# Patient Record
Sex: Female | Born: 1945 | Race: Black or African American | Hispanic: No | Marital: Single | Smoking: Former smoker
Health system: Southern US, Community
[De-identification: ages and names within clinical notes are randomized; demographics above are authoritative.]

## PROBLEM LIST (undated history)

## (undated) DIAGNOSIS — K279 Peptic ulcer, site unspecified, unspecified as acute or chronic, without hemorrhage or perforation: Secondary | ICD-10-CM

## (undated) DIAGNOSIS — I6529 Occlusion and stenosis of unspecified carotid artery: Secondary | ICD-10-CM

## (undated) DIAGNOSIS — E785 Hyperlipidemia, unspecified: Secondary | ICD-10-CM

## (undated) DIAGNOSIS — K219 Gastro-esophageal reflux disease without esophagitis: Secondary | ICD-10-CM

## (undated) DIAGNOSIS — Z9289 Personal history of other medical treatment: Secondary | ICD-10-CM

## (undated) DIAGNOSIS — R0602 Shortness of breath: Secondary | ICD-10-CM

## (undated) DIAGNOSIS — I340 Nonrheumatic mitral (valve) insufficiency: Secondary | ICD-10-CM

## (undated) DIAGNOSIS — K8020 Calculus of gallbladder without cholecystitis without obstruction: Secondary | ICD-10-CM

## (undated) DIAGNOSIS — M8580 Other specified disorders of bone density and structure, unspecified site: Secondary | ICD-10-CM

## (undated) DIAGNOSIS — D509 Iron deficiency anemia, unspecified: Secondary | ICD-10-CM

## (undated) DIAGNOSIS — E162 Hypoglycemia, unspecified: Secondary | ICD-10-CM

## (undated) DIAGNOSIS — N189 Chronic kidney disease, unspecified: Secondary | ICD-10-CM

## (undated) DIAGNOSIS — I10 Essential (primary) hypertension: Secondary | ICD-10-CM

## (undated) DIAGNOSIS — E119 Type 2 diabetes mellitus without complications: Secondary | ICD-10-CM

## (undated) DIAGNOSIS — I739 Peripheral vascular disease, unspecified: Secondary | ICD-10-CM

## (undated) DIAGNOSIS — I2510 Atherosclerotic heart disease of native coronary artery without angina pectoris: Secondary | ICD-10-CM

## (undated) DIAGNOSIS — H269 Unspecified cataract: Secondary | ICD-10-CM

## (undated) DIAGNOSIS — I700 Atherosclerosis of aorta: Secondary | ICD-10-CM

## (undated) DIAGNOSIS — M1990 Unspecified osteoarthritis, unspecified site: Secondary | ICD-10-CM

## (undated) HISTORY — PX: ABDOMINAL HYSTERECTOMY: SHX81

## (undated) HISTORY — DX: Type 2 diabetes mellitus without complications: E11.9

## (undated) HISTORY — DX: Iron deficiency anemia, unspecified: D50.9

## (undated) HISTORY — DX: Occlusion and stenosis of unspecified carotid artery: I65.29

## (undated) HISTORY — DX: Essential (primary) hypertension: I10

## (undated) HISTORY — PX: COLONOSCOPY: SHX174

## (undated) HISTORY — DX: Other specified disorders of bone density and structure, unspecified site: M85.80

## (undated) HISTORY — PX: APPENDECTOMY: SHX54

## (undated) HISTORY — PX: CORONARY ANGIOPLASTY WITH STENT PLACEMENT: SHX49

## (undated) HISTORY — DX: Peptic ulcer, site unspecified, unspecified as acute or chronic, without hemorrhage or perforation: K27.9

## (undated) HISTORY — DX: Peripheral vascular disease, unspecified: I73.9

## (undated) HISTORY — DX: Nonrheumatic mitral (valve) insufficiency: I34.0

## (undated) HISTORY — DX: Gastro-esophageal reflux disease without esophagitis: K21.9

## (undated) HISTORY — DX: Hyperlipidemia, unspecified: E78.5

## (undated) HISTORY — DX: Unspecified cataract: H26.9

## (undated) HISTORY — DX: Personal history of other medical treatment: Z92.89

## (undated) HISTORY — DX: Atherosclerotic heart disease of native coronary artery without angina pectoris: I25.10

---

## 1998-02-11 ENCOUNTER — Emergency Department (HOSPITAL_COMMUNITY): Admission: EM | Admit: 1998-02-11 | Discharge: 1998-02-11 | Payer: Self-pay

## 1998-04-30 ENCOUNTER — Encounter: Admission: RE | Admit: 1998-04-30 | Discharge: 1998-04-30 | Payer: Self-pay | Admitting: Hematology and Oncology

## 1998-06-15 ENCOUNTER — Encounter: Admission: RE | Admit: 1998-06-15 | Discharge: 1998-06-15 | Payer: Self-pay | Admitting: Internal Medicine

## 1998-06-18 ENCOUNTER — Ambulatory Visit (HOSPITAL_COMMUNITY): Admission: RE | Admit: 1998-06-18 | Discharge: 1998-06-18 | Payer: Self-pay | Admitting: Internal Medicine

## 1998-07-22 ENCOUNTER — Encounter: Admission: RE | Admit: 1998-07-22 | Discharge: 1998-07-22 | Payer: Self-pay | Admitting: Internal Medicine

## 1998-09-10 ENCOUNTER — Ambulatory Visit: Admission: RE | Admit: 1998-09-10 | Discharge: 1998-09-10 | Payer: Self-pay | Admitting: Internal Medicine

## 1998-09-30 ENCOUNTER — Encounter: Admission: RE | Admit: 1998-09-30 | Discharge: 1998-09-30 | Payer: Self-pay | Admitting: Internal Medicine

## 1998-11-09 ENCOUNTER — Encounter: Admission: RE | Admit: 1998-11-09 | Discharge: 1998-11-09 | Payer: Self-pay | Admitting: Internal Medicine

## 1999-02-23 ENCOUNTER — Encounter: Admission: RE | Admit: 1999-02-23 | Discharge: 1999-02-23 | Payer: Self-pay | Admitting: Internal Medicine

## 1999-02-23 ENCOUNTER — Ambulatory Visit (HOSPITAL_COMMUNITY): Admission: RE | Admit: 1999-02-23 | Discharge: 1999-02-23 | Payer: Self-pay | Admitting: Internal Medicine

## 1999-03-16 ENCOUNTER — Ambulatory Visit (HOSPITAL_COMMUNITY): Admission: RE | Admit: 1999-03-16 | Discharge: 1999-03-16 | Payer: Self-pay | Admitting: Internal Medicine

## 1999-04-26 ENCOUNTER — Encounter: Admission: RE | Admit: 1999-04-26 | Discharge: 1999-04-26 | Payer: Self-pay | Admitting: Internal Medicine

## 1999-09-06 ENCOUNTER — Encounter: Admission: RE | Admit: 1999-09-06 | Discharge: 1999-09-06 | Payer: Self-pay | Admitting: Internal Medicine

## 1999-09-17 ENCOUNTER — Ambulatory Visit (HOSPITAL_COMMUNITY): Admission: RE | Admit: 1999-09-17 | Discharge: 1999-09-17 | Payer: Self-pay | Admitting: Internal Medicine

## 2000-01-17 ENCOUNTER — Encounter: Admission: RE | Admit: 2000-01-17 | Discharge: 2000-01-17 | Payer: Self-pay | Admitting: Internal Medicine

## 2000-05-31 ENCOUNTER — Encounter: Admission: RE | Admit: 2000-05-31 | Discharge: 2000-05-31 | Payer: Self-pay | Admitting: Internal Medicine

## 2000-07-14 ENCOUNTER — Ambulatory Visit (HOSPITAL_BASED_OUTPATIENT_CLINIC_OR_DEPARTMENT_OTHER): Admission: RE | Admit: 2000-07-14 | Discharge: 2000-07-14 | Payer: Self-pay | Admitting: Internal Medicine

## 2000-10-02 ENCOUNTER — Encounter: Admission: RE | Admit: 2000-10-02 | Discharge: 2000-10-02 | Payer: Self-pay | Admitting: Internal Medicine

## 2000-10-09 ENCOUNTER — Encounter: Admission: RE | Admit: 2000-10-09 | Discharge: 2000-10-09 | Payer: Self-pay | Admitting: Internal Medicine

## 2000-10-12 ENCOUNTER — Ambulatory Visit (HOSPITAL_COMMUNITY): Admission: RE | Admit: 2000-10-12 | Discharge: 2000-10-12 | Payer: Self-pay | Admitting: Internal Medicine

## 2000-11-23 ENCOUNTER — Encounter: Admission: RE | Admit: 2000-11-23 | Discharge: 2000-11-23 | Payer: Self-pay | Admitting: Hematology and Oncology

## 2001-01-08 ENCOUNTER — Encounter: Admission: RE | Admit: 2001-01-08 | Discharge: 2001-01-08 | Payer: Self-pay | Admitting: Internal Medicine

## 2001-01-17 ENCOUNTER — Ambulatory Visit (HOSPITAL_COMMUNITY): Admission: RE | Admit: 2001-01-17 | Discharge: 2001-01-17 | Payer: Self-pay | Admitting: *Deleted

## 2001-01-23 ENCOUNTER — Ambulatory Visit (HOSPITAL_COMMUNITY): Admission: RE | Admit: 2001-01-23 | Discharge: 2001-01-23 | Payer: Self-pay | Admitting: *Deleted

## 2001-05-21 ENCOUNTER — Encounter: Admission: RE | Admit: 2001-05-21 | Discharge: 2001-05-21 | Payer: Self-pay | Admitting: Internal Medicine

## 2001-07-24 ENCOUNTER — Encounter: Admission: RE | Admit: 2001-07-24 | Discharge: 2001-07-24 | Payer: Self-pay | Admitting: Internal Medicine

## 2001-07-24 ENCOUNTER — Ambulatory Visit (HOSPITAL_COMMUNITY): Admission: RE | Admit: 2001-07-24 | Discharge: 2001-07-24 | Payer: Self-pay | Admitting: Internal Medicine

## 2001-08-03 ENCOUNTER — Encounter: Admission: RE | Admit: 2001-08-03 | Discharge: 2001-08-03 | Payer: Self-pay | Admitting: Internal Medicine

## 2001-09-26 ENCOUNTER — Encounter: Admission: RE | Admit: 2001-09-26 | Discharge: 2001-09-26 | Payer: Self-pay | Admitting: Internal Medicine

## 2002-02-11 ENCOUNTER — Encounter: Admission: RE | Admit: 2002-02-11 | Discharge: 2002-02-11 | Payer: Self-pay | Admitting: Internal Medicine

## 2002-02-11 ENCOUNTER — Ambulatory Visit (HOSPITAL_COMMUNITY): Admission: RE | Admit: 2002-02-11 | Discharge: 2002-02-11 | Payer: Self-pay | Admitting: Internal Medicine

## 2002-02-15 ENCOUNTER — Ambulatory Visit (HOSPITAL_COMMUNITY): Admission: RE | Admit: 2002-02-15 | Discharge: 2002-02-15 | Payer: Self-pay | Admitting: *Deleted

## 2002-05-29 ENCOUNTER — Encounter: Admission: RE | Admit: 2002-05-29 | Discharge: 2002-05-29 | Payer: Self-pay | Admitting: Internal Medicine

## 2003-01-06 ENCOUNTER — Emergency Department (HOSPITAL_COMMUNITY): Admission: EM | Admit: 2003-01-06 | Discharge: 2003-01-06 | Payer: Self-pay | Admitting: Emergency Medicine

## 2003-02-10 ENCOUNTER — Encounter: Admission: RE | Admit: 2003-02-10 | Discharge: 2003-02-10 | Payer: Self-pay | Admitting: Internal Medicine

## 2003-03-02 ENCOUNTER — Emergency Department (HOSPITAL_COMMUNITY): Admission: EM | Admit: 2003-03-02 | Discharge: 2003-03-02 | Payer: Self-pay | Admitting: Emergency Medicine

## 2003-03-17 ENCOUNTER — Encounter: Admission: RE | Admit: 2003-03-17 | Discharge: 2003-03-17 | Payer: Self-pay | Admitting: Internal Medicine

## 2003-03-22 ENCOUNTER — Ambulatory Visit (HOSPITAL_COMMUNITY): Admission: RE | Admit: 2003-03-22 | Discharge: 2003-03-22 | Payer: Self-pay | Admitting: Internal Medicine

## 2003-03-22 ENCOUNTER — Encounter: Payer: Self-pay | Admitting: Internal Medicine

## 2003-04-03 ENCOUNTER — Encounter: Payer: Self-pay | Admitting: Emergency Medicine

## 2003-04-03 ENCOUNTER — Emergency Department (HOSPITAL_COMMUNITY): Admission: EM | Admit: 2003-04-03 | Discharge: 2003-04-03 | Payer: Self-pay | Admitting: Emergency Medicine

## 2003-04-04 ENCOUNTER — Encounter: Admission: RE | Admit: 2003-04-04 | Discharge: 2003-04-04 | Payer: Self-pay | Admitting: Internal Medicine

## 2003-04-04 ENCOUNTER — Ambulatory Visit (HOSPITAL_COMMUNITY): Admission: RE | Admit: 2003-04-04 | Discharge: 2003-04-04 | Payer: Self-pay | Admitting: Internal Medicine

## 2003-04-15 ENCOUNTER — Encounter: Payer: Self-pay | Admitting: *Deleted

## 2003-04-15 ENCOUNTER — Ambulatory Visit (HOSPITAL_COMMUNITY): Admission: RE | Admit: 2003-04-15 | Discharge: 2003-04-15 | Payer: Self-pay | Admitting: *Deleted

## 2003-05-14 ENCOUNTER — Encounter: Admission: RE | Admit: 2003-05-14 | Discharge: 2003-05-14 | Payer: Self-pay | Admitting: Internal Medicine

## 2003-09-24 ENCOUNTER — Inpatient Hospital Stay (HOSPITAL_COMMUNITY): Admission: EM | Admit: 2003-09-24 | Discharge: 2003-09-27 | Payer: Self-pay | Admitting: Emergency Medicine

## 2004-07-28 ENCOUNTER — Ambulatory Visit: Payer: Self-pay | Admitting: Internal Medicine

## 2004-11-29 ENCOUNTER — Ambulatory Visit: Payer: Self-pay | Admitting: Internal Medicine

## 2005-01-13 ENCOUNTER — Ambulatory Visit: Payer: Self-pay | Admitting: Cardiology

## 2005-01-25 ENCOUNTER — Ambulatory Visit: Payer: Self-pay

## 2005-05-31 ENCOUNTER — Ambulatory Visit: Payer: Self-pay | Admitting: Internal Medicine

## 2005-08-05 ENCOUNTER — Ambulatory Visit: Payer: Self-pay | Admitting: Cardiology

## 2005-09-29 ENCOUNTER — Ambulatory Visit: Payer: Self-pay | Admitting: Cardiology

## 2005-10-07 ENCOUNTER — Ambulatory Visit: Payer: Self-pay | Admitting: Cardiology

## 2005-11-11 ENCOUNTER — Ambulatory Visit: Payer: Self-pay | Admitting: Cardiology

## 2005-11-17 ENCOUNTER — Ambulatory Visit: Payer: Self-pay | Admitting: Cardiology

## 2005-11-23 ENCOUNTER — Ambulatory Visit (HOSPITAL_COMMUNITY): Admission: RE | Admit: 2005-11-23 | Discharge: 2005-11-23 | Payer: Self-pay | Admitting: Internal Medicine

## 2005-12-05 ENCOUNTER — Ambulatory Visit: Payer: Self-pay | Admitting: Internal Medicine

## 2005-12-26 ENCOUNTER — Ambulatory Visit: Payer: Self-pay

## 2005-12-29 ENCOUNTER — Ambulatory Visit: Payer: Self-pay | Admitting: Cardiology

## 2006-01-31 ENCOUNTER — Ambulatory Visit: Payer: Self-pay

## 2006-01-31 ENCOUNTER — Ambulatory Visit: Payer: Self-pay | Admitting: Cardiology

## 2006-03-16 ENCOUNTER — Ambulatory Visit: Payer: Self-pay | Admitting: Cardiology

## 2006-03-23 ENCOUNTER — Ambulatory Visit: Payer: Self-pay | Admitting: Cardiology

## 2006-06-05 ENCOUNTER — Ambulatory Visit: Payer: Self-pay | Admitting: Internal Medicine

## 2006-06-08 ENCOUNTER — Ambulatory Visit: Payer: Self-pay | Admitting: Internal Medicine

## 2006-07-21 ENCOUNTER — Ambulatory Visit: Payer: Self-pay | Admitting: Internal Medicine

## 2006-08-31 ENCOUNTER — Ambulatory Visit: Payer: Self-pay

## 2006-09-07 ENCOUNTER — Ambulatory Visit: Payer: Self-pay | Admitting: Cardiology

## 2006-11-27 ENCOUNTER — Ambulatory Visit (HOSPITAL_COMMUNITY): Admission: RE | Admit: 2006-11-27 | Discharge: 2006-11-27 | Payer: Self-pay | Admitting: Internal Medicine

## 2006-12-06 ENCOUNTER — Ambulatory Visit: Payer: Self-pay | Admitting: Internal Medicine

## 2006-12-06 LAB — CONVERTED CEMR LAB
BUN: 13 mg/dL (ref 6–23)
CO2: 26 meq/L (ref 19–32)
Chloride: 109 meq/L (ref 96–112)
Cholesterol: 120 mg/dL (ref 0–200)
Creatinine, Ser: 1.2 mg/dL (ref 0.4–1.2)
GFR calc non Af Amer: 48 mL/min
Glucose, Bld: 112 mg/dL — ABNORMAL HIGH (ref 70–99)
HDL: 36 mg/dL — ABNORMAL LOW (ref >39.0)
LDL Cholesterol: 55 mg/dL (ref 0–99)
Sodium: 144 meq/L (ref 135–145)
Total CHOL/HDL Ratio: 3.3
Triglycerides: 145 mg/dL (ref 0–149)

## 2007-02-02 ENCOUNTER — Ambulatory Visit: Payer: Self-pay

## 2007-02-05 ENCOUNTER — Ambulatory Visit: Payer: Self-pay | Admitting: Cardiology

## 2007-02-16 ENCOUNTER — Ambulatory Visit: Payer: Self-pay

## 2007-02-22 ENCOUNTER — Ambulatory Visit: Payer: Self-pay | Admitting: Cardiovascular Disease

## 2007-06-11 ENCOUNTER — Ambulatory Visit: Payer: Self-pay | Admitting: Internal Medicine

## 2007-06-11 DIAGNOSIS — I2510 Atherosclerotic heart disease of native coronary artery without angina pectoris: Secondary | ICD-10-CM

## 2007-06-11 DIAGNOSIS — E119 Type 2 diabetes mellitus without complications: Secondary | ICD-10-CM | POA: Insufficient documentation

## 2007-06-11 DIAGNOSIS — I10 Essential (primary) hypertension: Secondary | ICD-10-CM | POA: Insufficient documentation

## 2007-06-11 DIAGNOSIS — I080 Rheumatic disorders of both mitral and aortic valves: Secondary | ICD-10-CM | POA: Insufficient documentation

## 2007-06-11 DIAGNOSIS — M810 Age-related osteoporosis without current pathological fracture: Secondary | ICD-10-CM | POA: Insufficient documentation

## 2007-06-11 DIAGNOSIS — K279 Peptic ulcer, site unspecified, unspecified as acute or chronic, without hemorrhage or perforation: Secondary | ICD-10-CM | POA: Insufficient documentation

## 2007-06-11 DIAGNOSIS — E781 Pure hyperglyceridemia: Secondary | ICD-10-CM

## 2007-06-11 DIAGNOSIS — I6529 Occlusion and stenosis of unspecified carotid artery: Secondary | ICD-10-CM

## 2007-06-11 DIAGNOSIS — H409 Unspecified glaucoma: Secondary | ICD-10-CM | POA: Insufficient documentation

## 2007-06-11 DIAGNOSIS — K219 Gastro-esophageal reflux disease without esophagitis: Secondary | ICD-10-CM | POA: Insufficient documentation

## 2007-06-11 LAB — CONVERTED CEMR LAB
AST: 14 units/L (ref 0–37)
Albumin: 4 g/dL (ref 3.5–5.2)
BUN: 17 mg/dL (ref 6–23)
Basophils Absolute: 0 10*3/uL (ref 0.0–0.1)
CO2: 26 meq/L (ref 19–32)
Chloride: 112 meq/L (ref 96–112)
Cholesterol: 126 mg/dL (ref 0–200)
Eosinophils Absolute: 0.4 10*3/uL (ref 0.0–0.6)
GFR calc non Af Amer: 40 mL/min
Glucose, Bld: 138 mg/dL — ABNORMAL HIGH (ref 70–99)
Hgb A1c MFr Bld: 6.3 % — ABNORMAL HIGH (ref 4.6–6.0)
MCV: 86.8 fL (ref 78.0–100.0)
Microalb Creat Ratio: 11.2 mg/g (ref 0.0–30.0)
Microalb, Ur: 0.3 mg/dL (ref 0.0–1.9)
Neutrophils Relative %: 64.6 % (ref 43.0–77.0)
Sodium: 145 meq/L (ref 135–145)
TSH: 1.49 microintl units/mL (ref 0.35–5.50)
Total Bilirubin: 0.5 mg/dL (ref 0.3–1.2)
Total Protein: 7.2 g/dL (ref 6.0–8.3)
WBC: 13.1 10*3/uL — ABNORMAL HIGH (ref 4.5–10.5)

## 2007-08-02 ENCOUNTER — Ambulatory Visit: Payer: Self-pay | Admitting: Cardiology

## 2007-08-02 LAB — CONVERTED CEMR LAB
Albumin: 3.2 g/dL — ABNORMAL LOW (ref 3.5–5.2)
Alkaline Phosphatase: 56 units/L (ref 39–117)
Bilirubin, Direct: 0.1 mg/dL (ref 0.0–0.3)
HDL: 36.7 mg/dL — ABNORMAL LOW (ref >39.0)
LDL Cholesterol: 54 mg/dL (ref 0–99)
Triglycerides: 86 mg/dL (ref 0–149)

## 2007-08-09 ENCOUNTER — Ambulatory Visit: Payer: Self-pay | Admitting: Cardiology

## 2007-09-27 ENCOUNTER — Encounter: Payer: Self-pay | Admitting: Endocrinology

## 2007-11-30 ENCOUNTER — Ambulatory Visit (HOSPITAL_COMMUNITY): Admission: RE | Admit: 2007-11-30 | Discharge: 2007-11-30 | Payer: Self-pay | Admitting: Internal Medicine

## 2007-12-13 ENCOUNTER — Ambulatory Visit: Payer: Self-pay | Admitting: Internal Medicine

## 2007-12-13 DIAGNOSIS — I739 Peripheral vascular disease, unspecified: Secondary | ICD-10-CM | POA: Insufficient documentation

## 2007-12-13 DIAGNOSIS — D509 Iron deficiency anemia, unspecified: Secondary | ICD-10-CM | POA: Insufficient documentation

## 2007-12-19 ENCOUNTER — Ambulatory Visit: Payer: Self-pay | Admitting: Internal Medicine

## 2007-12-19 LAB — CONVERTED CEMR LAB
BUN: 23 mg/dL (ref 6–23)
Chloride: 109 meq/L (ref 96–112)
GFR calc non Af Amer: 37 mL/min
Glucose, Bld: 100 mg/dL — ABNORMAL HIGH (ref 70–99)
HDL: 43.6 mg/dL (ref >39.0)
Hgb A1c MFr Bld: 5.8 % (ref 4.6–6.0)
LDL Cholesterol: 80 mg/dL (ref 0–99)

## 2008-01-07 ENCOUNTER — Ambulatory Visit: Payer: Self-pay | Admitting: Cardiology

## 2008-01-07 LAB — CONVERTED CEMR LAB
Albumin: 3.8 g/dL (ref 3.5–5.2)
HDL: 46.1 mg/dL (ref >39.0)
LDL Cholesterol: 96 mg/dL (ref 0–99)
VLDL: 24 mg/dL (ref 0–40)

## 2008-01-10 ENCOUNTER — Ambulatory Visit: Payer: Self-pay | Admitting: Cardiology

## 2008-02-06 ENCOUNTER — Ambulatory Visit: Payer: Self-pay

## 2008-04-03 ENCOUNTER — Ambulatory Visit: Payer: Self-pay | Admitting: Cardiology

## 2008-04-30 ENCOUNTER — Telehealth: Payer: Self-pay | Admitting: Internal Medicine

## 2008-04-30 ENCOUNTER — Encounter: Payer: Self-pay | Admitting: Internal Medicine

## 2008-05-05 ENCOUNTER — Ambulatory Visit: Payer: Self-pay | Admitting: Internal Medicine

## 2008-06-09 ENCOUNTER — Telehealth: Payer: Self-pay | Admitting: Internal Medicine

## 2008-06-12 ENCOUNTER — Ambulatory Visit: Payer: Self-pay | Admitting: Cardiology

## 2008-06-17 ENCOUNTER — Ambulatory Visit: Payer: Self-pay | Admitting: Internal Medicine

## 2008-06-17 ENCOUNTER — Encounter (INDEPENDENT_AMBULATORY_CARE_PROVIDER_SITE_OTHER): Payer: Self-pay | Admitting: *Deleted

## 2008-06-17 DIAGNOSIS — M5412 Radiculopathy, cervical region: Secondary | ICD-10-CM | POA: Insufficient documentation

## 2008-06-17 DIAGNOSIS — M25519 Pain in unspecified shoulder: Secondary | ICD-10-CM

## 2008-06-17 LAB — CONVERTED CEMR LAB
CO2: 21 meq/L (ref 19–32)
Chloride: 112 meq/L (ref 96–112)
Cholesterol: 140 mg/dL (ref 0–200)
Creatinine, Ser: 1.4 mg/dL — ABNORMAL HIGH (ref 0.4–1.2)
GFR calc Af Amer: 49 mL/min
HDL: 51.2 mg/dL (ref >39.0)
LDL Cholesterol: 66 mg/dL (ref 0–99)
Potassium: 4.4 meq/L (ref 3.5–5.1)
Sodium: 147 meq/L — ABNORMAL HIGH (ref 135–145)
Triglycerides: 116 mg/dL (ref 0–149)
VLDL: 23 mg/dL (ref 0–40)

## 2008-06-18 ENCOUNTER — Telehealth: Payer: Self-pay | Admitting: Internal Medicine

## 2008-06-19 ENCOUNTER — Telehealth (INDEPENDENT_AMBULATORY_CARE_PROVIDER_SITE_OTHER): Payer: Self-pay | Admitting: *Deleted

## 2008-06-19 ENCOUNTER — Ambulatory Visit: Payer: Self-pay

## 2008-06-19 ENCOUNTER — Telehealth: Payer: Self-pay | Admitting: Internal Medicine

## 2008-06-21 ENCOUNTER — Encounter: Admission: RE | Admit: 2008-06-21 | Discharge: 2008-06-21 | Payer: Self-pay | Admitting: Internal Medicine

## 2008-07-01 ENCOUNTER — Telehealth: Payer: Self-pay | Admitting: Internal Medicine

## 2008-07-04 ENCOUNTER — Telehealth (INDEPENDENT_AMBULATORY_CARE_PROVIDER_SITE_OTHER): Payer: Self-pay | Admitting: *Deleted

## 2008-07-18 ENCOUNTER — Encounter: Payer: Self-pay | Admitting: Internal Medicine

## 2008-08-27 ENCOUNTER — Ambulatory Visit: Payer: Self-pay | Admitting: Internal Medicine

## 2008-09-24 ENCOUNTER — Ambulatory Visit: Payer: Self-pay | Admitting: Cardiology

## 2008-09-24 LAB — CONVERTED CEMR LAB
Albumin: 3.5 g/dL (ref 3.5–5.2)
Alkaline Phosphatase: 48 units/L (ref 39–117)
Cholesterol: 132 mg/dL (ref 0–200)
HDL: 37.4 mg/dL — ABNORMAL LOW (ref >39.0)
LDL Cholesterol: 66 mg/dL (ref 0–99)
Total CHOL/HDL Ratio: 3.5
Total Protein: 6.6 g/dL (ref 6.0–8.3)
VLDL: 29 mg/dL (ref 0–40)

## 2008-09-25 ENCOUNTER — Ambulatory Visit: Payer: Self-pay | Admitting: Cardiology

## 2008-11-03 ENCOUNTER — Telehealth (INDEPENDENT_AMBULATORY_CARE_PROVIDER_SITE_OTHER): Payer: Self-pay | Admitting: *Deleted

## 2008-11-04 ENCOUNTER — Encounter: Payer: Self-pay | Admitting: Internal Medicine

## 2008-12-01 ENCOUNTER — Ambulatory Visit (HOSPITAL_COMMUNITY): Admission: RE | Admit: 2008-12-01 | Discharge: 2008-12-01 | Payer: Self-pay | Admitting: Internal Medicine

## 2008-12-18 ENCOUNTER — Ambulatory Visit: Payer: Self-pay | Admitting: Internal Medicine

## 2008-12-18 DIAGNOSIS — R5381 Other malaise: Secondary | ICD-10-CM

## 2008-12-18 DIAGNOSIS — R5383 Other fatigue: Secondary | ICD-10-CM

## 2008-12-19 LAB — CONVERTED CEMR LAB
ALT: 12 units/L (ref 0–35)
BUN: 21 mg/dL (ref 6–23)
Basophils Absolute: 0.1 10*3/uL (ref 0.0–0.1)
CO2: 25 meq/L (ref 19–32)
Calcium: 8.5 mg/dL (ref 8.4–10.5)
Creatinine, Ser: 1.4 mg/dL — ABNORMAL HIGH (ref 0.4–1.2)
GFR calc Af Amer: 49 mL/min
HCT: 33.2 % — ABNORMAL LOW (ref 36.0–46.0)
Hemoglobin: 11.3 g/dL — ABNORMAL LOW (ref 12.0–15.0)
LDL Cholesterol: 69 mg/dL (ref 0–99)
MCV: 83.7 fL (ref 78.0–100.0)
Monocytes Absolute: 0.4 10*3/uL (ref 0.1–1.0)
Neutro Abs: 5 10*3/uL (ref 1.4–7.7)
Neutrophils Relative %: 58.5 % (ref 43.0–77.0)
Sodium: 143 meq/L (ref 135–145)
Total CHOL/HDL Ratio: 3.5
Triglycerides: 159 mg/dL — ABNORMAL HIGH (ref 0–149)

## 2008-12-30 ENCOUNTER — Telehealth: Payer: Self-pay | Admitting: Internal Medicine

## 2008-12-30 ENCOUNTER — Encounter: Admission: RE | Admit: 2008-12-30 | Discharge: 2009-01-20 | Payer: Self-pay | Admitting: Internal Medicine

## 2008-12-30 ENCOUNTER — Encounter: Payer: Self-pay | Admitting: Internal Medicine

## 2009-01-06 ENCOUNTER — Telehealth: Payer: Self-pay | Admitting: Internal Medicine

## 2009-01-20 ENCOUNTER — Encounter: Payer: Self-pay | Admitting: Internal Medicine

## 2009-02-05 ENCOUNTER — Ambulatory Visit: Payer: Self-pay

## 2009-02-09 ENCOUNTER — Telehealth: Payer: Self-pay | Admitting: Internal Medicine

## 2009-02-10 ENCOUNTER — Ambulatory Visit: Payer: Self-pay | Admitting: Internal Medicine

## 2009-02-10 DIAGNOSIS — R351 Nocturia: Secondary | ICD-10-CM | POA: Insufficient documentation

## 2009-02-11 ENCOUNTER — Encounter: Payer: Self-pay | Admitting: Internal Medicine

## 2009-02-11 LAB — CONVERTED CEMR LAB
Bilirubin Urine: NEGATIVE
Specific Gravity, Urine: 1.01 (ref 1.000–1.030)
Urine Glucose: NEGATIVE mg/dL
Urobilinogen, UA: 0.2 (ref 0.0–1.0)

## 2009-03-11 ENCOUNTER — Telehealth (INDEPENDENT_AMBULATORY_CARE_PROVIDER_SITE_OTHER): Payer: Self-pay | Admitting: *Deleted

## 2009-03-24 ENCOUNTER — Ambulatory Visit: Payer: Self-pay | Admitting: Cardiology

## 2009-03-25 LAB — CONVERTED CEMR LAB
AST: 15 units/L (ref 0–37)
Alkaline Phosphatase: 53 units/L (ref 39–117)
Bilirubin, Direct: 0.1 mg/dL (ref 0.0–0.3)
Cholesterol: 120 mg/dL (ref 0–200)
Total CHOL/HDL Ratio: 3
Total Protein: 7.3 g/dL (ref 6.0–8.3)
Triglycerides: 109 mg/dL (ref 0.0–149.0)

## 2009-03-26 ENCOUNTER — Ambulatory Visit: Payer: Self-pay | Admitting: Internal Medicine

## 2009-04-06 ENCOUNTER — Telehealth: Payer: Self-pay | Admitting: Internal Medicine

## 2009-04-10 ENCOUNTER — Encounter (INDEPENDENT_AMBULATORY_CARE_PROVIDER_SITE_OTHER): Payer: Self-pay | Admitting: *Deleted

## 2009-04-14 ENCOUNTER — Ambulatory Visit: Payer: Self-pay | Admitting: Internal Medicine

## 2009-04-14 LAB — CONVERTED CEMR LAB
Chloride: 111 meq/L (ref 96–112)
Hgb A1c MFr Bld: 6.3 % (ref 4.6–6.5)
LDL Cholesterol: 69 mg/dL (ref 0–99)
Sodium: 147 meq/L — ABNORMAL HIGH (ref 135–145)
Total CHOL/HDL Ratio: 4

## 2009-04-30 ENCOUNTER — Encounter: Payer: Self-pay | Admitting: Internal Medicine

## 2009-05-05 ENCOUNTER — Telehealth: Payer: Self-pay | Admitting: Internal Medicine

## 2009-05-18 ENCOUNTER — Telehealth: Payer: Self-pay | Admitting: Internal Medicine

## 2009-06-08 ENCOUNTER — Telehealth: Payer: Self-pay | Admitting: Internal Medicine

## 2009-06-12 ENCOUNTER — Ambulatory Visit: Payer: Self-pay | Admitting: Cardiology

## 2009-06-22 ENCOUNTER — Telehealth: Payer: Self-pay | Admitting: Internal Medicine

## 2009-06-26 ENCOUNTER — Telehealth: Payer: Self-pay | Admitting: Internal Medicine

## 2009-07-31 ENCOUNTER — Telehealth: Payer: Self-pay | Admitting: Internal Medicine

## 2009-07-31 ENCOUNTER — Encounter: Payer: Self-pay | Admitting: Internal Medicine

## 2009-08-03 ENCOUNTER — Telehealth: Payer: Self-pay | Admitting: Internal Medicine

## 2009-08-31 ENCOUNTER — Telehealth: Payer: Self-pay | Admitting: Internal Medicine

## 2009-09-21 ENCOUNTER — Ambulatory Visit: Payer: Self-pay | Admitting: Cardiology

## 2009-09-24 ENCOUNTER — Ambulatory Visit: Payer: Self-pay | Admitting: Cardiovascular Disease

## 2009-09-27 LAB — CONVERTED CEMR LAB
ALT: 13 units/L (ref 0–35)
Cholesterol: 146 mg/dL (ref 0–200)
LDL Cholesterol: 77 mg/dL (ref 0–99)
Total Bilirubin: 1 mg/dL (ref 0.3–1.2)
Total CHOL/HDL Ratio: 3
Triglycerides: 134 mg/dL (ref 0.0–149.0)

## 2009-09-28 ENCOUNTER — Encounter: Payer: Self-pay | Admitting: Cardiology

## 2009-09-30 ENCOUNTER — Ambulatory Visit: Payer: Self-pay | Admitting: Internal Medicine

## 2009-10-01 DIAGNOSIS — D519 Vitamin B12 deficiency anemia, unspecified: Secondary | ICD-10-CM | POA: Insufficient documentation

## 2009-10-01 LAB — CONVERTED CEMR LAB
BUN: 15 mg/dL (ref 6–23)
Bilirubin Urine: NEGATIVE
CO2: 25 meq/L (ref 19–32)
Chloride: 109 meq/L (ref 96–112)
Creatinine, Ser: 1.5 mg/dL — ABNORMAL HIGH (ref 0.4–1.2)
Eosinophils Absolute: 0.4 10*3/uL (ref 0.0–0.7)
Eosinophils Relative: 4.9 % (ref 0.0–5.0)
GFR calc non Af Amer: 44.77 mL/min (ref >60)
Hemoglobin, Urine: NEGATIVE
Hemoglobin: 9.7 g/dL — ABNORMAL LOW (ref 12.0–15.0)
Lymphs Abs: 2.6 10*3/uL (ref 0.7–4.0)
Monocytes Relative: 5.7 % (ref 3.0–12.0)
Neutrophils Relative %: 55.9 % (ref 43.0–77.0)
Nitrite: NEGATIVE
Platelets: 327 10*3/uL (ref 150.0–400.0)
Potassium: 4.6 meq/L (ref 3.5–5.1)
RBC: 3.39 M/uL — ABNORMAL LOW (ref 3.87–5.11)
Sodium: 146 meq/L — ABNORMAL HIGH (ref 135–145)
Specific Gravity, Urine: 1.015 (ref 1.000–1.030)
Urobilinogen, UA: 0.2 (ref 0.0–1.0)
WBC: 7.9 10*3/uL (ref 4.5–10.5)

## 2009-10-02 LAB — CONVERTED CEMR LAB: Saturation Ratios: 16.7 % — ABNORMAL LOW (ref 20.0–50.0)

## 2009-10-30 ENCOUNTER — Ambulatory Visit: Payer: Self-pay | Admitting: Internal Medicine

## 2009-11-11 ENCOUNTER — Telehealth: Payer: Self-pay | Admitting: Internal Medicine

## 2009-12-01 ENCOUNTER — Telehealth: Payer: Self-pay | Admitting: Internal Medicine

## 2009-12-04 ENCOUNTER — Ambulatory Visit: Payer: Self-pay | Admitting: Internal Medicine

## 2009-12-07 ENCOUNTER — Telehealth: Payer: Self-pay | Admitting: Internal Medicine

## 2009-12-16 ENCOUNTER — Encounter: Payer: Self-pay | Admitting: Internal Medicine

## 2009-12-18 ENCOUNTER — Encounter: Payer: Self-pay | Admitting: Internal Medicine

## 2009-12-24 ENCOUNTER — Telehealth: Payer: Self-pay | Admitting: Internal Medicine

## 2010-01-04 ENCOUNTER — Ambulatory Visit: Payer: Self-pay | Admitting: Internal Medicine

## 2010-01-04 ENCOUNTER — Telehealth: Payer: Self-pay | Admitting: Internal Medicine

## 2010-01-11 ENCOUNTER — Telehealth: Payer: Self-pay | Admitting: Internal Medicine

## 2010-01-22 ENCOUNTER — Ambulatory Visit: Payer: Self-pay | Admitting: Cardiology

## 2010-02-03 ENCOUNTER — Ambulatory Visit: Payer: Self-pay | Admitting: Internal Medicine

## 2010-02-11 ENCOUNTER — Encounter: Payer: Self-pay | Admitting: Cardiology

## 2010-02-11 ENCOUNTER — Ambulatory Visit: Payer: Self-pay

## 2010-02-11 ENCOUNTER — Ambulatory Visit: Payer: Self-pay | Admitting: Internal Medicine

## 2010-02-11 ENCOUNTER — Ambulatory Visit (HOSPITAL_COMMUNITY): Admission: RE | Admit: 2010-02-11 | Discharge: 2010-02-11 | Payer: Self-pay | Admitting: Cardiology

## 2010-03-05 ENCOUNTER — Ambulatory Visit: Payer: Self-pay | Admitting: Internal Medicine

## 2010-03-29 ENCOUNTER — Ambulatory Visit: Payer: Self-pay | Admitting: Internal Medicine

## 2010-03-31 ENCOUNTER — Encounter: Payer: Self-pay | Admitting: Cardiology

## 2010-03-31 LAB — CONVERTED CEMR LAB
ALT: 11 units/L (ref 0–35)
Bilirubin, Direct: 0.1 mg/dL (ref 0.0–0.3)
HDL: 36.1 mg/dL — ABNORMAL LOW (ref >39.00)
LDL Cholesterol: 51 mg/dL (ref 0–99)
Total Bilirubin: 0.8 mg/dL (ref 0.3–1.2)
Total CHOL/HDL Ratio: 3
Total Protein: 6.5 g/dL (ref 6.0–8.3)
Triglycerides: 186 mg/dL — ABNORMAL HIGH (ref 0.0–149.0)
VLDL: 37.2 mg/dL (ref 0.0–40.0)

## 2010-04-01 ENCOUNTER — Ambulatory Visit: Payer: Self-pay | Admitting: Internal Medicine

## 2010-04-01 DIAGNOSIS — R609 Edema, unspecified: Secondary | ICD-10-CM

## 2010-04-06 ENCOUNTER — Telehealth: Payer: Self-pay | Admitting: Internal Medicine

## 2010-04-06 ENCOUNTER — Ambulatory Visit: Payer: Self-pay | Admitting: Internal Medicine

## 2010-05-03 ENCOUNTER — Encounter: Payer: Self-pay | Admitting: Internal Medicine

## 2010-05-04 ENCOUNTER — Ambulatory Visit: Payer: Self-pay | Admitting: Internal Medicine

## 2010-06-04 ENCOUNTER — Ambulatory Visit: Payer: Self-pay | Admitting: Internal Medicine

## 2010-07-12 ENCOUNTER — Telehealth: Payer: Self-pay | Admitting: Internal Medicine

## 2010-07-15 ENCOUNTER — Encounter: Payer: Self-pay | Admitting: Internal Medicine

## 2010-07-19 ENCOUNTER — Telehealth: Payer: Self-pay | Admitting: Internal Medicine

## 2010-07-20 ENCOUNTER — Encounter: Payer: Self-pay | Admitting: Internal Medicine

## 2010-07-27 ENCOUNTER — Telehealth: Payer: Self-pay | Admitting: Internal Medicine

## 2010-09-20 ENCOUNTER — Telehealth: Payer: Self-pay | Admitting: Internal Medicine

## 2010-09-24 ENCOUNTER — Telehealth: Payer: Self-pay | Admitting: Internal Medicine

## 2010-09-24 ENCOUNTER — Emergency Department (HOSPITAL_COMMUNITY)
Admission: EM | Admit: 2010-09-24 | Discharge: 2010-09-24 | Payer: Self-pay | Source: Home / Self Care | Admitting: Family Medicine

## 2010-09-27 ENCOUNTER — Encounter: Payer: Self-pay | Admitting: Internal Medicine

## 2010-09-30 ENCOUNTER — Ambulatory Visit: Payer: Self-pay | Admitting: Internal Medicine

## 2010-10-06 ENCOUNTER — Encounter: Payer: Self-pay | Admitting: Internal Medicine

## 2010-10-07 ENCOUNTER — Ambulatory Visit: Payer: Self-pay | Admitting: Internal Medicine

## 2010-10-07 LAB — CONVERTED CEMR LAB
AST: 16 units/L (ref 0–37)
Albumin: 4 g/dL (ref 3.5–5.2)
Basophils Absolute: 0 10*3/uL (ref 0.0–0.1)
Basophils Relative: 0.2 % (ref 0.0–3.0)
Bilirubin Urine: NEGATIVE
Cholesterol: 134 mg/dL (ref 0–200)
HCT: 29.7 % — ABNORMAL LOW (ref 36.0–46.0)
Ketones, ur: NEGATIVE mg/dL
LDL Cholesterol: 65 mg/dL (ref 0–99)
MCHC: 33.2 g/dL (ref 30.0–36.0)
Microalb Creat Ratio: 6 mg/g (ref 0.0–30.0)
Microalb, Ur: 3.6 mg/dL — ABNORMAL HIGH (ref 0.0–1.9)
Monocytes Relative: 2.5 % — ABNORMAL LOW (ref 3.0–12.0)
Neutro Abs: 12.2 10*3/uL — ABNORMAL HIGH (ref 1.4–7.7)
Neutrophils Relative %: 85.8 % — ABNORMAL HIGH (ref 43.0–77.0)
Nitrite: NEGATIVE
Platelets: 468 10*3/uL — ABNORMAL HIGH (ref 150.0–400.0)
Potassium: 4.9 meq/L (ref 3.5–5.1)
TSH: 0.53 microintl units/mL (ref 0.35–5.50)
Triglycerides: 127 mg/dL (ref 0.0–149.0)
Urobilinogen, UA: 0.2 (ref 0.0–1.0)
VLDL: 25.4 mg/dL (ref 0.0–40.0)
WBC: 14.2 10*3/uL — ABNORMAL HIGH (ref 4.5–10.5)
pH: 5.5 (ref 5.0–8.0)

## 2010-10-21 ENCOUNTER — Telehealth: Payer: Self-pay | Admitting: Internal Medicine

## 2010-11-23 NOTE — Assessment & Plan Note (Signed)
Summary: B12/Ammie Warrick/CD   Nurse Visit   Allergies: No Known Drug Allergies  Medication Administration  Injection # 1:    Medication: Vit B12 1000 mcg    Diagnosis: VITAMIN B12 DEFICIENCY (ICD-266.2)    Route: IM    Site: R deltoid    Exp Date: 01/23/2012    Lot #: [id number]    Mfr: [employer] Regent    Patient tolerated injection without complications    Given by: Crissie Sickles, [place] (May 04, 2010 2:20 PM)  Orders Added: 1)  Admin of Therapeutic Inj  intramuscular or subcutaneous [96372] 2)  Vit B12 1000 mcg [id number]

## 2010-11-23 NOTE — Progress Notes (Signed)
Summary: CALL  Phone Note Call from Patient   Summary of Call: Patient is requesting a call back regarding her cbg's.  Initial call taken by: Charlsie Quest, [place],  July 27, 2010 11:34 AM  Follow-up for Phone Call        left message on machine for pt to return my call. Crissie Sickles, CMA  July 27, 2010 2:33 PM   Pt was called to repot her CBGs for the last week; Monday 182 Tuesday  220 Wednesday 112 Thursday  164 Friday 170 Saturday 95 Sunday 96 Monday 171 Tuesday 240 Wednesday 99 This morning 144 Follow-up by: Crissie Sickles, CMA,  July 29, 2010 8:26 AM  Additional Follow-up for Phone Call Additional follow up Details #1::        ok to cont meds as is for now Additional Follow-up by: [name] MD,  July 29, 2010 9:23 AM    Additional Follow-up for Phone Call Additional follow up Details #2::    Pt informed Follow-up by: Crissie Sickles, [place],  July 29, 2010 9:28 AM  T

## 2010-11-23 NOTE — Medication Information (Signed)
Summary: Diabetic Supplies / [facility]  Diabetic Supplies / [facility]   Imported By: [name] 07/22/2010 10:41:46  _____________________________________________________________________  External Attachment:    Type:   Image     Comment:   External Document

## 2010-11-23 NOTE — Progress Notes (Signed)
  Phone Note Refill Request  on December 24, 2009 11:46 AM  Refills Requested: Medication #1:  AMLODIPINE BESYLATE 10 MG TABS 1 tablet by mouth once a day   Dosage confirmed as above?Dosage Confirmed Initial call taken by: Sharon Seller,  December 24, 2009 11:47 AM    Prescriptions: AMLODIPINE BESYLATE 10 MG TABS (AMLODIPINE BESYLATE) 1 tablet by mouth once a day  #30 x 8   Entered by:   Sharon Seller   Authorized by:   [name] MD   Signed by:   Sharon Seller on 12/24/2009   Method used:   Faxed to ...       [facility] (retail)       [address]       [place], [place]  [address]       Ph: [phone]       Fax: [phone]   RxID:   [id number]

## 2010-11-23 NOTE — Assessment & Plan Note (Signed)
Summary: PER PT 1 MTH B12 JW[place]  STC   Nurse Visit   Allergies: No Known Drug Allergies  Medication Administration  Injection # 1:    Medication: Vit B12 1000 mcg    Diagnosis: VITAMIN B12 DEFICIENCY (ICD-266.2)    Route: IM    Site: R deltoid    Exp Date: 06/25/2011    Lot #: [id number]    Mfr: [employer] Regent    Patient tolerated injection without complications    Given by: Crissie Sickles, [place] (December 04, 2009 11:05 AM)  Orders Added: 1)  Admin of Therapeutic Inj  intramuscular or subcutaneous [96372] 2)  Vit B12 1000 mcg [J3420] 3)  Est. Patient Level I [id number]

## 2010-11-23 NOTE — Progress Notes (Signed)
Summary: elevated CBGs  Phone Note Call from Patient Call back at Home Phone [phone]   Caller: Patient Summary of Call: Pt called stating that her CBG today was 238. Pt also had a high Friday 244. Pt checks her blood sugars daily and her normal average is 140. Pt also states that she is asymptomatic, no recent infection or med change, please advise. Initial call taken by: Crissie Sickles, CMA,  July 12, 2010 9:55 AM  Follow-up for Phone Call        ok to take 1 and 1/2 once daily  - new rx sent escript  cont to monitor blood sugars and call with results in 1 wk Follow-up by: [name] MD,  July 12, 2010 11:49 AM  Additional Follow-up for Phone Call Additional follow up Details #1::        No ans/no vm. Crissie Sickles, CMA  July 12, 2010 1:43 PM  Pt informed and will call with CBG reading in 1 week. Additional Follow-up by: Crissie Sickles, CMA,  July 12, 2010 4:28 PM    New/Updated Medications: GLIMEPIRIDE 2 MG TABS (GLIMEPIRIDE) 1 and 1/2  by mouth once daily Prescriptions: GLIMEPIRIDE 2 MG TABS (GLIMEPIRIDE) 1 and 1/2  by mouth once daily  #45 x 11   Entered and Authorized by:   [name] MD   Signed by:   [name] MD on 07/12/2010   Method used:   Faxed to ...       [facility] (retail)       [address]       [place], [place]  [address]       Ph: [phone]       Fax: [phone]   RxIDTQ:[id number] GLIMEPIRIDE 2 MG TABS (GLIMEPIRIDE) 1 and 1/2  by mouth once daily  #45 x 11   Entered and Authorized by:   [name] MD   Signed by:   [name] MD on 07/12/2010   Method used:   Print then Give to Patient   RxID:   [id number]

## 2010-11-23 NOTE — Assessment & Plan Note (Signed)
Summary: 1 MTH B12 [place]  STC   Nurse Visit   Allergies: No Known Drug Allergies  Medication Administration  Injection # 1:    Medication: Vit B12 1000 mcg    Diagnosis: VITAMIN B12 DEFICIENCY (ICD-266.2)    Route: IM    Site: R deltoid    Exp Date: 08/25/2011    Lot #: [id number]    Mfr: [employer] Regent    Patient tolerated injection without complications    Given by: Crissie Sickles, [place] (February 03, 2010 2:26 PM)  Orders Added: 1)  Vit B12 1000 mcg [J3420] 2)  Admin of Therapeutic Inj  intramuscular or subcutaneous [96372] 3)  Est. Patient Level I [id number]

## 2010-11-23 NOTE — Progress Notes (Signed)
  Phone Note Refill Request Message from:  Fax from Pharmacy on September 20, 2010 1:47 PM  Refills Requested: Medication #1:  AMLODIPINE BESYLATE 10 MG TABS 1 tablet by mouth once a day   Dosage confirmed as above?Dosage Confirmed   Notes: [name] Initial call taken by: Shirlean Mylar Ewing CMA Deborra Medina),  September 20, 2010 1:47 PM    Prescriptions: AMLODIPINE BESYLATE 10 MG TABS (AMLODIPINE BESYLATE) 1 tablet by mouth once a day  #30 x 7   Entered by:   Sharon Seller CMA ([place])   Authorized by:   [name] MD   Signed by:   Sharon Seller CMA ([place]) on 09/20/2010   Method used:   Faxed to ...       [facility] (retail)       [address]       [place], [place]  [address]       Ph: [phone]       Fax: [phone]   RxID:   [id number]

## 2010-11-23 NOTE — Progress Notes (Signed)
  Phone Note Refill Request  on December 01, 2009 12:48 PM  Refills Requested: Medication #1:  METFORMIN HCL 1000 MG  TABS 1 by mouth two times a day   Dosage confirmed as above?Dosage Confirmed   Notes: [name] Initial call taken by: Sharon Seller,  December 01, 2009 12:49 PM    Prescriptions: METFORMIN HCL 1000 MG  TABS (METFORMIN HCL) 1 by mouth two times a day  #60 x 11   Entered by:   Sharon Seller   Authorized by:   [name] MD   Signed by:   Sharon Seller on 12/01/2009   Method used:   Faxed to ...       [facility] (retail)       [address]       [place], [place]  [address]       Ph: [phone]       Fax: [phone]   RxID:   [id number]

## 2010-11-23 NOTE — Progress Notes (Signed)
  Phone Note Call from Patient Call back at Home Phone [phone]   Caller: Patient Summary of Call: pt called requesting refills of Metoprolol. Initial call taken by: Crissie Sickles, [place],  December 07, 2009 2:53 PM    Prescriptions: METOPROLOL TARTRATE 100 MG TABS (METOPROLOL TARTRATE) Take 1 tablet by mouth twice a day  #60 x 9   Entered by:   Crissie Sickles, CMA   Authorized by:   [name] MD   Signed by:   Crissie Sickles, CMA on 12/07/2009   Method used:   Faxed to ...       [facility] (retail)       [address]       [place], [place]  [address]       Ph: [phone]       Fax: [phone]   RxID:   [id number]

## 2010-11-23 NOTE — Assessment & Plan Note (Signed)
Summary: B-12 / Jen Mow Bonney Leitz   Nurse Visit   Allergies: No Known Drug Allergies  Medication Administration  Injection # 1:    Medication: Vit B12 1000 mcg    Diagnosis: VITAMIN B12 DEFICIENCY (ICD-266.2)    Route: IV    Site: L deltoid    Exp Date: 02/2012    Lot #: [id number]    Mfr: [place]    Patient tolerated injection without complications    Given by: Estell Harpin CMA (June 04, 2010 2:26 PM)  Orders Added: 1)  Admin of Therapeutic Inj  intramuscular or subcutaneous [96372] 2)  Vit B12 1000 mcg [J3420]   Medication Administration  Injection # 1:    Medication: Vit B12 1000 mcg    Diagnosis: VITAMIN B12 DEFICIENCY (ICD-266.2)    Route: IV    Site: L deltoid    Exp Date: 02/2012    Lot #: [id number]    Mfr: [place]    Patient tolerated injection without complications    Given by: Estell Harpin CMA (June 04, 2010 2:26 PM)  Orders Added: 1)  Admin of Therapeutic Inj  intramuscular or subcutaneous [96372] 2)  Vit B12 1000 mcg [id number]

## 2010-11-23 NOTE — Progress Notes (Signed)
  Phone Note Refill Request  on January 11, 2010 1:02 PM  Refills Requested: Medication #1:  OMEPRAZOLE 20 MG CPDR Take 1 capsule by mouth once a day   Dosage confirmed as above?Dosage Confirmed Initial call taken by: Sharon Seller,  January 11, 2010 1:03 PM    Prescriptions: OMEPRAZOLE 20 MG CPDR (OMEPRAZOLE) Take 1 capsule by mouth once a day  #34 x 8   Entered by:   Sharon Seller   Authorized by:   [name] MD   Signed by:   Sharon Seller on 01/11/2010   Method used:   Faxed to ...       [facility] ([facility])       [address]       [place], [place]  [address]       Ph: [phone]       Fax: [phone]   RxID:   [id number]

## 2010-11-23 NOTE — Letter (Signed)
Summary: Marketing executive at [place], [place] [address]   Phone: [phone]  Fax: [phone]         March 31, 2010 MRN: [id number]     [facility] [place], [place]  [address]     Dear Ms. Hillebrand,    We have reviewed your cholesterol results.  They are as follows:     Total Cholesterol:    124 (Desirable: less than 200)       HDL  Cholesterol:     36.10  (Desirable: greater than 40 for men and 50 for women)       LDL Cholesterol:       51  (Desirable: less than 100 for low risk and less than 70 for moderate to high risk)       Triglycerides:       186.0  (Desirable: less than 150)  Our recommendations include:  HCL is low and exercise would help to increase this good cholestrol level. No change in medications.   Call our office at the number listed above if you have any questions.  Lowering your LDL cholesterol is important, but it is only one of a large number of "risk factors" that may indicate that you are at risk for heart disease, stroke or other complications of hardening of the arteries.  Other risk factors include:   A.  Cigarette Smoking* B.  High Blood Pressure* C.  Obesity* D.   Low HDL Cholesterol (see yours above)* E.   Diabetes Mellitus (higher risk if your is uncontrolled) F.  Family history of premature heart disease G.  Previous history of stroke or cardiovascular disease        *These are risk factors YOU HAVE CONTROL OVER.  For more information, visit .  There is now evidence that lowering the TOTAL CHOLESTEROL AND LDL CHOLESTEROL can reduce the risk of heart disease.  The American Heart Association recommends the following guidelines for the treatment of elevated cholesterol:  1.  If there is now current heart disease and less than two risk factors, TOTAL CHOLESTEROL should be less than 200 and LDL CHOLESTEROL should be less than 100. 2.  If there is current heart disease  or two or more risk factors, TOTAL CHOLESTEROL should be less than 200 and LDL CHOLESTEROL should be less than 70.  A diet low in cholesterol, saturated fat, and calories is the cornerstone of treatment for elevated cholesterol.  Cessation of smoking and exercise are also important in the management of elevated cholesterol and preventing vascular disease.  Studies have shown that 30 to 60 minutes of physical activity most days can help lower blood pressure, lower cholesterol, and keep your weight at a healthy level.  Drug therapy is used when cholesterol levels do not respond to therapeutic lifestyle changes (smoking cessation, diet, and exercise) and remains unacceptably high.  If medication is started, it is important to have you levels checked periodically to evaluate the need for further treatment options.    Thank you,     Vic Ripper, RN Dr [facility]

## 2010-11-23 NOTE — Progress Notes (Signed)
  Phone Note Refill Request Message from:  Fax from Pharmacy on November 11, 2009 1:36 PM  Refills Requested: Medication #1:  BENAZEPRIL HCL 40 MG TABS 1 tablet by mouth once a day   Dosage confirmed as above?Dosage Confirmed Initial call taken by: Gardenia Phlegm CMA,  November 11, 2009 1:36 PM    Prescriptions: BENAZEPRIL HCL 40 MG TABS (BENAZEPRIL HCL) 1 tablet by mouth once a day  #30 x 4   Entered by:   Gardenia Phlegm CMA   Authorized by:   [name] MD   Signed by:   Gardenia Phlegm CMA on 11/11/2009   Method used:   Faxed to ...       [facility] (retail)       [address]       [place], [place]  [address]       Ph: [phone]       Fax: [phone]   RxID:   [id number]

## 2010-11-23 NOTE — Assessment & Plan Note (Signed)
Summary: 6 MO ROV /NWS  #   Vital Signs:  Patient profile:   65 year old female Height:      61 inches Weight:      164.50 pounds BMI:     31.19 O2 Sat:      95 % on Room air Temp:     97.9 degrees F oral Pulse rate:   80 / minute BP sitting:   118 / 60  (left arm) Cuff size:   regular  Vitals Entered ByShirlean Mylar Ewing (April 01, 2010 1:28 PM)  O2 Flow:  Room air CC: 6 month ROV/RE   Primary Care Provider:  [name] MD  CC:  6 month ROV/RE.  History of Present Illness: saw Dr Rolena Infante for right shoulder - had PT which seemed to help, but pain recurred, no surgury per Dr Rolena Infante as she is on plavix (per pt);  stil with impairment of the shoulder with unable to  abduct the arm (expecially in the am) past 20 degrees and cant lay on it at night due to increased pain;  has been taking alleve every 4 hrs, and sometimes tylenol as needed  on top of that;  used to take ultram ER 200 mg but made her "out in space." so does not want to take further;  Pt denies CP, sob, doe, wheezing, orthopnea, pnd, worsening LE edema, palps, dizziness or syncope   Pt denies new neuro symptoms such as headache, facial or extremity weakness Current pain meds only last about 4 hrs.  Pt denies CP, sob, doe, wheezing, orthopnea, pnd, palps, dizziness or syncope  Pt denies new neuro symptoms such as headache, facial or extremity weakness Pt denies polydipsia, polyuria, or low sugar symptoms such as shakiness improved with eating.  Overall good compliance with meds, trying to follow low chol, DM diet, wt stable, little excercise however  Has mild bilat LE sweling as well in the past few wks with ? increased by mouth fluid intake; goes away at night, worse later in the day with mild discomfort  Problems Prior to Update: 1)  Peripheral Edema  (ICD-782.3) 2)  Shoulder Pain, Left  (ICD-719.41) 3)  Vitamin B12 Deficiency  (ICD-266.2) 4)  Preventive Health Care  (ICD-V70.0) 5)  Encounter For Long-term Use of Other  Medications  (ICD-V58.69) 6)  Nocturia  (ICD-788.43) 7)  Fatigue  (ICD-780.79) 8)  Shoulder Pain, Bilateral  (ICD-719.41) 9)  Shoulder Pain, Right  (ICD-719.41) 10)  Cervical Radiculopathy  (ICD-723.4) 11)  Peptic Ulcer Disease  (ICD-533.90) 12)  Anemia-iron Deficiency  (ICD-280.9) 13)  Peripheral Vascular Disease  (ICD-443.9) 14)  Osteoporosis  (ICD-733.00) 15)  Carotid Artery Stenosis  (ICD-433.10) 16)  Pud  (ICD-533.90) 17)  Hypertension  (ICD-401.9) 18)  Hyperlipidemia  (ICD-272.4) 19)  Gerd  (ICD-530.81) 20)  Diabetes Mellitus, Type II  (ICD-250.00) 21)  Coronary Artery Disease  (ICD-414.00) 22)  Mitral Regurgitation, 2 Plus  (ICD-396.3) 23)  Glaucoma Nos  (ICD-365.9)  Medications Prior to Update: 1)  Actonel 150 Mg Tabs (Risedronate Sodium) .Marland Kitchen.. 1 By Mouth Q Month 2)  Amlodipine Besylate 10 Mg Tabs (Amlodipine Besylate) .Marland Kitchen.. 1 Tablet By Mouth Once A Day 3)  Benazepril Hcl 40 Mg Tabs (Benazepril Hcl) .Marland Kitchen.. 1 Tablet By Mouth Once A Day 4)  Lipitor 80 Mg Tabs (Atorvastatin Calcium) .... Take 1 Tablet By Mouth Once A Day 5)  Metformin Hcl 1000 Mg  Tabs (Metformin Hcl) .Marland Kitchen.. 1 By Mouth Two Times A Day 6)  Metoprolol Tartrate 100 Mg Tabs (Metoprolol Tartrate) .... Take 1 Tablet By Mouth Twice A Day 7)  Omeprazole 20 Mg Cpdr (Omeprazole) .... Take 1 Capsule By Mouth Once A Day 8)  Plavix 75 Mg Tabs (Clopidogrel Bisulfate) .... Take 1 Tablet By Mouth Once A Day 9)  Zetia 10 Mg Tabs (Ezetimibe) .... Take 1 Tablet By Mouth Once A Day 10)  Iron 325 (65 Fe) Mg Tabs (Ferrous Sulfate) .... Take 1 Tablet By Mouth Two Times A Day 11)  Ultram Er 200 Mg Xr24h-Tab (Tramadol Hcl) .Marland Kitchen.. 1 By Mouth Once Daily 12)  Ascriptin 325 Mg Tabs (Aspirin Buf(Alhyd-Mghyd-Cacar)) .Marland Kitchen.. 1 By Mouth Once Daily 13)  Glimepiride 2 Mg Tabs (Glimepiride) .Marland Kitchen.. 1 By Mouth Once Daily 14)  Nitrostat 0.4 Mg Subl (Nitroglycerin) .... Use As Directed 15)  Cyanocobalamin 1000 Mcg/ml Soln (Cyanocobalamin) .Marland Kitchen.. 1 Cc Im Q  Month  Current Medications (verified): 1)  Actonel 150 Mg Tabs (Risedronate Sodium) .Marland Kitchen.. 1 By Mouth Q Month 2)  Amlodipine Besylate 10 Mg Tabs (Amlodipine Besylate) .Marland Kitchen.. 1 Tablet By Mouth Once A Day 3)  Benazepril Hcl 40 Mg Tabs (Benazepril Hcl) .Marland Kitchen.. 1 Tablet By Mouth Once A Day 4)  Lipitor 80 Mg Tabs (Atorvastatin Calcium) .... Take 1 Tablet By Mouth Once A Day 5)  Metformin Hcl 1000 Mg  Tabs (Metformin Hcl) .Marland Kitchen.. 1 By Mouth Two Times A Day 6)  Metoprolol Tartrate 100 Mg Tabs (Metoprolol Tartrate) .... Take 1 Tablet By Mouth Twice A Day 7)  Omeprazole 20 Mg Cpdr (Omeprazole) .... Take 1 Capsule By Mouth Once A Day 8)  Plavix 75 Mg Tabs (Clopidogrel Bisulfate) .... Take 1 Tablet By Mouth Once A Day 9)  Zetia 10 Mg Tabs (Ezetimibe) .... Take 1 Tablet By Mouth Once A Day 10)  Iron 325 (65 Fe) Mg Tabs (Ferrous Sulfate) .... Take 1 Tablet By Mouth Two Times A Day 11)  Tylenol With Codeine #3 300-30 Mg Tabs (Acetaminophen-Codeine) .Marland Kitchen.. 1po Q 6 Hrs As Needed Pain 12)  Ascriptin 325 Mg Tabs (Aspirin Buf(Alhyd-Mghyd-Cacar)) .Marland Kitchen.. 1 By Mouth Once Daily 13)  Glimepiride 2 Mg Tabs (Glimepiride) .Marland Kitchen.. 1 By Mouth Once Daily 14)  Nitrostat 0.4 Mg Subl (Nitroglycerin) .... Use As Directed 15)  Cyanocobalamin 1000 Mcg/ml Soln (Cyanocobalamin) .Marland Kitchen.. 1 Cc Im Q Month 16)  Hydrochlorothiazide 12.5 Mg Caps (Hydrochlorothiazide) .Marland Kitchen.. 1po Once Daily  Allergies (verified): No Known Drug Allergies  Past History:  Past Medical History: Last updated: 06/12/2009 Glaucoma Mitral Regurgitation 2+ Coronary artery disease(in December 2004, 60% proximal LAD stenosis, circumflex 85% stenosis, extending into an acute marginal, 60-70% right coronary artery stenosis, and 60% RV branch stenosis.  The patient had Taxus stenting of the mid circumflex.  She had PTCA of the distal circumflex at a bifurcation with a second obtuse marginal with a Kissing-Balloon technique),  Diabetes mellitus, type  II GERD Hyperlipidemia Hypertension Peptic Ulcer Disease with anemia Mild Carotid Stenosis Osteoporosis Peripheral vascular disease(0.95 right ABI and 1.0 left ABI).  Anemia-iron deficiency Peptic ulcer disease - gastric and duodenal ulcers  Past Surgical History: Last updated: 01/22/2010 Appendectomy Hysterectomy Oophorectomy  Social History: Last updated: 03/26/2009 Stopped smoking 1.5 years ago (January 2009) greater than 50 year pack history Alcohol use-no homemaker 3 children widow  Risk Factors: Smoking Status: current (12/13/2007)  Review of Systems       all otherwise negative per pt -    Physical Exam  General:  alert and overweight-appearing.  , in no apparent distress Head:  normocephalic  and atraumatic.   Eyes:  vision grossly intact, pupils equal, and pupils round.   Ears:  R ear normal and L ear normal.   Nose:  no external deformity and no nasal discharge.   Mouth:  no gingival abnormalities and pharynx pink and moist.   Neck:  supple and no JVD.   Lungs:  normal respiratory effort and normal breath sounds.   Heart:  normal rate and regular rhythm.   Msk:  left shoudler nontender but with discomfort on ROM and only abduction to 20 degrees Extremities:  trace edema bilat, no erythema Neurologic:  UE's o/w neurovasc intact Skin:  color normal and no rashes.   Psych:  normally interactive and slightly anxious.     Impression & Recommendations:  Problem # 1:  SHOULDER PAIN, LEFT (ICD-719.41)  Her updated medication list for this problem includes:    Tylenol With Codeine #3 300-30 Mg Tabs (Acetaminophen-codeine) .Marland Kitchen... 1po q 6 hrs as needed pain    Ascriptin 325 Mg Tabs (Aspirin buf(alhyd-mghyd-cacar)) .Marland Kitchen... 1 by mouth once daily treat as above, f/u any worsening signs or symptoms , f/u wtih Dr Rolena Infante as planned  Problem # 2:  DIABETES MELLITUS, TYPE II (ICD-250.00)  Her updated medication list for this problem includes:    Benazepril Hcl 40 Mg  Tabs (Benazepril hcl) .Marland Kitchen... 1 tablet by mouth once a day    Metformin Hcl 1000 Mg Tabs (Metformin hcl) .Marland Kitchen... 1 by mouth two times a day    Ascriptin 325 Mg Tabs (Aspirin buf(alhyd-mghyd-cacar)) .Marland Kitchen... 1 by mouth once daily    Glimepiride 2 Mg Tabs (Glimepiride) .Marland Kitchen... 1 by mouth once daily  Labs Reviewed: Creat: 1.5 (09/30/2009)    Reviewed HgBA1c results: 6.2 (09/30/2009)  6.3 (04/14/2009) stable overall by hx and exam, ok to continue meds/tx as is   Problem # 3:  HYPERTENSION (ICD-401.9)  Her updated medication list for this problem includes:    Amlodipine Besylate 10 Mg Tabs (Amlodipine besylate) .Marland Kitchen... 1 tablet by mouth once a day    Benazepril Hcl 40 Mg Tabs (Benazepril hcl) .Marland Kitchen... 1 tablet by mouth once a day    Metoprolol Tartrate 100 Mg Tabs (Metoprolol tartrate) .Marland Kitchen... Take 1 tablet by mouth twice a day    Hydrochlorothiazide 12.5 Mg Caps (Hydrochlorothiazide) .Marland Kitchen... 1po once daily  BP today: 118/60 Prior BP: 130/82 (01/22/2010)  Labs Reviewed: K+: 4.6 (09/30/2009) Creat: : 1.5 (09/30/2009)   Chol: 124 (03/29/2010)   HDL: 36.10 (03/29/2010)   LDL: 51 (03/29/2010)   TG: 186.0 (03/29/2010) stable overall by hx and exam, ok to continue meds/tx as is   Problem # 4:  HYPERLIPIDEMIA (ICD-272.4)  Her updated medication list for this problem includes:    Lipitor 80 Mg Tabs (Atorvastatin calcium) .Marland Kitchen... Take 1 tablet by mouth once a day    Zetia 10 Mg Tabs (Ezetimibe) .Marland Kitchen... Take 1 tablet by mouth once a day  Labs Reviewed: SGOT: 12 (03/29/2010)   SGPT: 11 (03/29/2010)   HDL:36.10 (03/29/2010), 42.40 (09/21/2009)  LDL:51 (03/29/2010), 77 (09/21/2009)  Chol:124 (03/29/2010), 146 (09/21/2009)  Trig:186.0 (03/29/2010), 134.0 (09/21/2009) stable overall by hx and exam, ok to continue meds/tx as is   Problem # 5:  PERIPHERAL EDEMA (ICD-782.3)  Her updated medication list for this problem includes:    Hydrochlorothiazide 12.5 Mg Caps (Hydrochlorothiazide) .Marland Kitchen... 1po once daily treat as  above, f/u any worsening signs or symptoms   Complete Medication List: 1)  Actonel 150 Mg Tabs (Risedronate sodium) .Marland Kitchen.. 1 by mouth q  month 2)  Amlodipine Besylate 10 Mg Tabs (Amlodipine besylate) .Marland Kitchen.. 1 tablet by mouth once a day 3)  Benazepril Hcl 40 Mg Tabs (Benazepril hcl) .Marland Kitchen.. 1 tablet by mouth once a day 4)  Lipitor 80 Mg Tabs (Atorvastatin calcium) .... Take 1 tablet by mouth once a day 5)  Metformin Hcl 1000 Mg Tabs (Metformin hcl) .Marland Kitchen.. 1 by mouth two times a day 6)  Metoprolol Tartrate 100 Mg Tabs (Metoprolol tartrate) .... Take 1 tablet by mouth twice a day 7)  Omeprazole 20 Mg Cpdr (Omeprazole) .... Take 1 capsule by mouth once a day 8)  Plavix 75 Mg Tabs (Clopidogrel bisulfate) .... Take 1 tablet by mouth once a day 9)  Zetia 10 Mg Tabs (Ezetimibe) .... Take 1 tablet by mouth once a day 10)  Iron 325 (65 Fe) Mg Tabs (Ferrous sulfate) .... Take 1 tablet by mouth two times a day 11)  Tylenol With Codeine #3 300-30 Mg Tabs (Acetaminophen-codeine) .Marland Kitchen.. 1po q 6 hrs as needed pain 12)  Ascriptin 325 Mg Tabs (Aspirin buf(alhyd-mghyd-cacar)) .Marland Kitchen.. 1 by mouth once daily 13)  Glimepiride 2 Mg Tabs (Glimepiride) .Marland Kitchen.. 1 by mouth once daily 14)  Nitrostat 0.4 Mg Subl (Nitroglycerin) .... Use as directed 15)  Cyanocobalamin 1000 Mcg/ml Soln (Cyanocobalamin) .Marland Kitchen.. 1 cc im q month 16)  Hydrochlorothiazide 12.5 Mg Caps (Hydrochlorothiazide) .Marland Kitchen.. 1po once daily  Patient Instructions: 1)  Please take all new medications as prescribed 2)  Continue all previous medications as before this visit  3)  Please schedule a follow-up appointment in 6 months with CPX labs and: 4)  HbgA1C prior to visit, ICD-9: 250.02 5)  Urine Microalbumin prior to visit, ICD-9: Prescriptions: HYDROCHLOROTHIAZIDE 12.5 MG CAPS (HYDROCHLOROTHIAZIDE) 1po once daily  #90 x 3   Entered and Authorized by:   [name] MD   Signed by:   [name] MD on 04/01/2010   Method used:   Print then Give to Patient   RxIDKF:[id number] [place] #3 300-30 MG TABS (ACETAMINOPHEN-CODEINE) 1po q 6 hrs as needed pain  #60 x 3   Entered and Authorized by:   [name] MD   Signed by:   [name] MD on 04/01/2010   Method used:   Print then Give to Patient   RxID:   [id number]

## 2010-11-23 NOTE — Letter (Signed)
Summary: [name]   Imported By: Bubba Hales 12/08/2009 08:07:56  _____________________________________________________________________  External Attachment:    Type:   Image     Comment:   External Document

## 2010-11-23 NOTE — Progress Notes (Signed)
Summary: CBG  Phone Note Call from Patient Call back at Home Phone [phone]   Caller: Patient Summary of Call: Pt called to report her weekly CBGs;  Monday 238  Tuesday 184 Wednesday 179 Thursday 273 Friday 166 Saturday 172 Sunday 151 Monday (today) 182 Initial call taken by: Crissie Sickles, CMA,  July 19, 2010 10:57 AM  Follow-up for Phone Call        I think ok to cont meds as is for now,   cont to monitor sugars as she does, and call it seems cbg's are consistently > 175  Follow-up by: [name] MD,  July 19, 2010 3:43 PM  Additional Follow-up for Phone Call Additional follow up Details #1::        Pt informed and will continue to monitor CBGs Additional Follow-up by: Crissie Sickles, CMA,  July 19, 2010 3:51 PM

## 2010-11-23 NOTE — Assessment & Plan Note (Signed)
Summary: F2Y PER PT CALLPER [facility]      Allergies Added: NKDA  Visit Type:  Follow-up Primary Provider:  [name] MD  CC:  CAD and PVD.  History of Present Illness: The patient presents for evaluation of her known coronary disease. Since I last saw her she has had no new cardiovascular complaints. She denies any chest discomfort, neck or arm discomfort. She doesn't report any shortness of breath at rest, PND or orthopnea. She has had no palpitations, presyncope or syncope. It's pretty clear that she's not particularly active. She climbs 10 stairs to her basement. She will get dyspneic with this. She has had no weight gain or swelling. I did review recent labs in December that included a hemoglobin A1c of 6.2. She had normal renal function. Her most recent LDL was 77 with an HDL of 42.4. She has been followed in the lipid clinic.  Current Medications (verified): 1)  Actonel 150 Mg Tabs (Risedronate Sodium) .Marland Kitchen.. 1 By Mouth Q Month 2)  Amlodipine Besylate 10 Mg Tabs (Amlodipine Besylate) .Marland Kitchen.. 1 Tablet By Mouth Once A Day 3)  Benazepril Hcl 40 Mg Tabs (Benazepril Hcl) .Marland Kitchen.. 1 Tablet By Mouth Once A Day 4)  Lipitor 80 Mg Tabs (Atorvastatin Calcium) .... Take 1 Tablet By Mouth Once A Day 5)  Metformin Hcl 1000 Mg  Tabs (Metformin Hcl) .Marland Kitchen.. 1 By Mouth Two Times A Day 6)  Metoprolol Tartrate 100 Mg Tabs (Metoprolol Tartrate) .... Take 1 Tablet By Mouth Twice A Day 7)  Omeprazole 20 Mg Cpdr (Omeprazole) .... Take 1 Capsule By Mouth Once A Day 8)  Plavix 75 Mg Tabs (Clopidogrel Bisulfate) .... Take 1 Tablet By Mouth Once A Day 9)  Zetia 10 Mg Tabs (Ezetimibe) .... Take 1 Tablet By Mouth Once A Day 10)  Iron 325 (65 Fe) Mg Tabs (Ferrous Sulfate) .... Take 1 Tablet By Mouth Two Times A Day 11)  Ultram Er 200 Mg Xr24h-Tab (Tramadol Hcl) .Marland Kitchen.. 1 By Mouth Once Daily 12)  Ascriptin 325 Mg Tabs (Aspirin Buf(Alhyd-Mghyd-Cacar)) .Marland Kitchen.. 1 By Mouth Once Daily 13)  Glimepiride 2 Mg Tabs (Glimepiride) .Marland Kitchen.. 1 By  Mouth Once Daily 14)  Nitrostat 0.4 Mg Subl (Nitroglycerin) .... Use As Directed 15)  Cyanocobalamin 1000 Mcg/ml Soln (Cyanocobalamin) .Marland Kitchen.. 1 Cc Im Q Month  Allergies (verified): No Known Drug Allergies  Past History:  Past Medical History: Reviewed history from 06/12/2009 and no changes required. Glaucoma Mitral Regurgitation 2+ Coronary artery disease(in December 2004, 60% proximal LAD stenosis, circumflex 85% stenosis, extending into an acute marginal, 60-70% right coronary artery stenosis, and 60% RV branch stenosis.  The patient had Taxus stenting of the mid circumflex.  She had PTCA of the distal circumflex at a bifurcation with a second obtuse marginal with a Kissing-Balloon technique),  Diabetes mellitus, type II GERD Hyperlipidemia Hypertension Peptic Ulcer Disease with anemia Mild Carotid Stenosis Osteoporosis Peripheral vascular disease(0.95 right ABI and 1.0 left ABI).  Anemia-iron deficiency Peptic ulcer disease - gastric and duodenal ulcers  Past Surgical History: Appendectomy Hysterectomy Oophorectomy  Review of Systems       As stated in the HPI and negative for all other systems.   Vital Signs:  Patient profile:   65 year old female Height:      61 inches Weight:      159 pounds BMI:     30.15 Pulse rate:   70 / minute Resp:     16 per minute BP sitting:   130 /  82  (right arm)  Vitals Entered By: Levora Angel, CNA (January 22, 2010 3:31 PM)  Physical Exam  General:  Well developed, well nourished, in no acute distress. Head:  normocephalic and atraumatic Eyes:  PERRLA/EOM intact; conjunctiva and lids normal. Mouth:   Oral mucosa normal. Neck:  Neck supple, no JVD. No masses, thyromegaly or abnormal cervical nodes. Chest Wall:  no deformities or breast masses noted Lungs:  Clear bilaterally to auscultation and percussion. Abdomen:  Bowel sounds positive; abdomen soft and non-tender without masses, organomegaly, or hernias noted. No  hepatosplenomegaly. Msk:  Back normal, normal gait. Muscle strength and tone normal. Extremities:  mild bilateral edema Neurologic:  Alert and oriented x 3. Skin:  Intact without lesions or rashes. Cervical Nodes:  no significant adenopathy Axillary Nodes:  no significant adenopathy Inguinal Nodes:  no significant adenopathy Psych:  Normal affect.   Detailed Cardiovascular Exam  Neck    Carotids: right carotid bruit:.      Neck Veins: Normal, no JVD.    Heart    Inspection: no deformities or lifts noted.      Palpation: normal PMI with no thrills palpable.      Auscultation: regular rate and rhythm, S1, S2 without murmurs, rubs, gallops, or clicks.    Vascular    Abdominal Aorta: no palpable masses, pulsations, or audible bruits.      Femoral Pulses: normal femoral pulses bilaterally.      Pedal Pulses: diminished right dorsalis pedis pulse, diminished right posterior tibial pulse, diminished left dorsalis pedis pulse, and diminished left posterior tibial pulse.  diminished right dorsalis pedis pulse, diminished right posterior tibial pulse, diminished left dorsalis pedis pulse, and diminished left posterior tibial pulse.      Radial Pulses: normal radial pulses bilaterally.      Peripheral Circulation: no clubbing, cyanosis, or edema noted with normal capillary refill.     EKG  Procedure date:  01/22/2010  Findings:      sus rhythm, rate 66, axis within normal limits, intervals within normal limits, nonspecific T wave changes.  Impression & Recommendations:  Problem # 1:  CORONARY ARTERY DISEASE (ICD-414.00) The patient had a stress perfusion study in 2009. She's had no new symptoms. She will continue with risk reduction. No further testing is indicated. Orders: EKG w/ Interpretation (93000)  Problem # 2:  CAROTID ARTERY STENOSIS (ICD-433.10) She has bilateral less than 50% stenosis. I will schedule her for followup Dopplers. Orders: Carotid Duplex (Carotid  Duplex)  Problem # 3:  HYPERTENSION (ICD-401.9) Her blood pressure is controlled. She will continue the meds as listed.  Problem # 4:  HYPERLIPIDEMIA (B2193296.4) Her lipids are near target and she will continue with this regimen.  Problem # 5:  MITRAL REGURGITATION, 2 PLUS (ICD-396.3) I will followup with an echocardiogram to evaluate his mitral regurgitation. Orders: Echocardiogram (Echo)  Patient Instructions: 1)  Your physician recommends that you schedule a follow-up appointment in: 12 months with Dr Percival Spanish 2)  Your physician recommends that you continue on your current medications as directed. Please refer to the Current Medication list given to you today. 3)  Your physician has requested that you have a carotid duplex. This test is an ultrasound of the carotid arteries in your neck. It looks at blood flow through these arteries that supply the brain with blood. Allow one hour for this exam. There are no restrictions or special instructions. 4)  Your physician has requested that you have an echocardiogram.  Echocardiography is a  painless test that uses sound waves to create images of your heart. It provides your doctor with information about the size and shape of your heart and how well your heart's chambers and valves are working.  This procedure takes approximately one hour. There are no restrictions for this procedure.

## 2010-11-23 NOTE — Medication Information (Signed)
Summary: [facility]/[employer] [facility] [employer]  [facility]/[employer]   Imported By: Phillis Knack 12/17/2009 10:10:37  _____________________________________________________________________  External Attachment:    Type:   Image     Comment:   External Document

## 2010-11-23 NOTE — Assessment & Plan Note (Signed)
Summary: inj b12--jwj--1 mth---stc   Nurse Visit   Allergies: No Known Drug Allergies  Medication Administration  Injection # 1:    Medication: Vit B12 1000 mcg    Diagnosis: VITAMIN B12 DEFICIENCY (ICD-266.2)    Route: IM    Site: L deltoid    Exp Date: 03/25/2011    Lot #: [id number]    Mfr: [place]    Patient tolerated injection without complications    Given by: Ernestene Mention (January 04, 2010 11:22 AM)  Orders Added: 1)  Vit B12 1000 mcg [J3420] 2)  Admin of Therapeutic Inj  intramuscular or subcutaneous [id number]

## 2010-11-23 NOTE — Letter (Signed)
Summary: [facility]   Imported By: Phillis Knack 05/07/2010 11:03:17  _____________________________________________________________________  External Attachment:    Type:   Image     Comment:   External Document

## 2010-11-23 NOTE — Letter (Signed)
Summary: [facility]  [facility]   Imported By: Phillis Knack 12/17/2009 10:11:51  _____________________________________________________________________  External Attachment:    Type:   Image     Comment:   External Document

## 2010-11-23 NOTE — Letter (Signed)
Summary: Diabetes Supplies/[facility]  [facility]   Imported By: Phillis Knack 09/29/2010 14:44:52  _____________________________________________________________________  External Attachment:    Type:   Image     Comment:   External Document

## 2010-11-23 NOTE — Progress Notes (Signed)
  Phone Note Refill Request Message from:  Pharmacy on April 06, 2010 3:25 PM  Refills Requested: Medication #1:  BENAZEPRIL HCL 40 MG TABS 1 tablet by mouth once a day   Dosage confirmed as above?Dosage Confirmed   Last Refilled: 11/11/2009   Notes: [name], [phone] Phone [phone] Initial call taken by: Sharon Seller,  April 06, 2010 3:26 PM    Prescriptions: BENAZEPRIL HCL 40 MG TABS (BENAZEPRIL HCL) 1 tablet by mouth once a day  #30 x 11   Entered by:   Sharon Seller   Authorized by:   [name] MD   Signed by:   Sharon Seller on 04/06/2010   Method used:   Faxed to ...       [facility] (retail)       [address]       [place], [place]  [address]       Ph: [phone]       Fax: [phone]   RxID:   [id number]

## 2010-11-23 NOTE — Assessment & Plan Note (Signed)
Summary: 1 MTH B12 PER PT--JWJ  STC   Nurse Visit   Allergies: No Known Drug Allergies  Medication Administration  Injection # 1:    Medication: Vit B12 1000 mcg    Diagnosis: VITAMIN B12 DEFICIENCY (ICD-266.2)    Route: IM    Site: R deltoid    Exp Date: 09/24/2011    Lot #: [id number]    Mfr: [place]    Patient tolerated injection without complications    Given by: Charlsie Quest, CMA (Mar 05, 2010 2:27 PM)  Orders Added: 1)  Vit B12 1000 mcg [J3420] 2)  Admin of Therapeutic Inj  intramuscular or subcutaneous [[id number]]   Medication Administration  Injection # 1:    Medication: Vit B12 1000 mcg    Diagnosis: VITAMIN B12 DEFICIENCY (ICD-266.2)    Route: IM    Site: R deltoid    Exp Date: 09/24/2011    Lot #: [id number]    Mfr: [employer] Regent    Patient tolerated injection without complications    Given by: Charlsie Quest, CMA (Mar 05, 2010 2:27 PM)  Orders Added: 1)  Vit B12 1000 mcg [J3420] 2)  Admin of Therapeutic Inj  intramuscular or subcutaneous [id number]

## 2010-11-23 NOTE — Medication Information (Signed)
Summary: [facility]  [facility] [employer]   Imported By: Phillis Knack 12/21/2009 14:24:35  _____________________________________________________________________  External Attachment:    Type:   Image     Comment:   External Document

## 2010-11-23 NOTE — Medication Information (Signed)
Summary: [facility]   Imported By: Bubba Hales 07/20/2010 11:56:41  _____________________________________________________________________  External Attachment:    Type:   Image     Comment:   External Document

## 2010-11-23 NOTE — Assessment & Plan Note (Signed)
Summary: b-12 [name]   Nurse Visit   Allergies: No Known Drug Allergies  Medication Administration  Injection # 1:    Medication: Vit B12 1000 mcg    Diagnosis: VITAMIN B12 DEFICIENCY (ICD-266.2)    Route: IM    Site: L deltoid    Exp Date: 01/2012    Lot #: [id number]    Mfr: American Regent    Patient tolerated injection without complications    Given by: Shirlean Mylar Ewing (April 06, 2010 2:02 PM)  Orders Added: 1)  Admin of Therapeutic Inj  intramuscular or subcutaneous [96372] 2)  Vit B12 1000 mcg [id number]

## 2010-11-23 NOTE — Progress Notes (Signed)
  Phone Note Refill Request  on January 04, 2010 10:23 AM  Refills Requested: Medication #1:  ACTONEL 150 MG TABS 1 by mouth q month   Dosage confirmed as above?Dosage Confirmed Initial call taken by: Sharon Seller,  January 04, 2010 10:23 AM    Prescriptions: ACTONEL 150 MG TABS (RISEDRONATE SODIUM) 1 by mouth q month  #1 x 11   Entered by:   Sharon Seller   Authorized by:   [name] MD   Signed by:   Sharon Seller on 01/04/2010   Method used:   Faxed to ...       [facility] (retail)       [address]       [place], [place]  [address]       Ph: [phone]       Fax: [phone]   RxID:   [id number]

## 2010-11-23 NOTE — Progress Notes (Signed)
  Phone Note Refill Request Message from:  Fax from Pharmacy on September 24, 2010 9:44 AM  Refills Requested: Medication #1:  PLAVIX 75 MG TABS Take 1 tablet by mouth once a day   Dosage confirmed as above?Dosage Confirmed   Last Refilled: 08/03/2009   Notes: [name] Initial call taken by: Shirlean Mylar Ewing CMA ([place]),  September 24, 2010 9:45 AM    Prescriptions: PLAVIX 75 MG TABS (CLOPIDOGREL BISULFATE) Take 1 tablet by mouth once a day  #30 x 7   Entered by:   Sharon Seller CMA ([place])   Authorized by:   [name] MD   Signed by:   Sharon Seller CMA ([place]) on 09/24/2010   Method used:   Faxed to ...       [facility] (retail)       [address]       [place], [place]  [address]       Ph: [phone]       Fax: [phone]   RxID:   [id number]

## 2010-11-23 NOTE — Assessment & Plan Note (Signed)
Summary: B12 INJ-LB   Nurse Visit   Allergies: No Known Drug Allergies  Medication Administration  Injection # 1:    Medication: Vit B12 1000 mcg    Diagnosis: VITAMIN B12 DEFICIENCY (ICD-266.2)    Route: IM    Site: L deltoid    Exp Date: 12/2010    Lot #: [id number]    Mfr: [employer] Regent    Patient tolerated injection without complications    Given by: Ami Bullins CMA (October 30, 2009 2:16 PM)  Orders Added: 1)  Vit B12 1000 mcg [J3420] 2)  Admin of Therapeutic Inj  intramuscular or subcutaneous [id number]

## 2010-11-25 NOTE — Assessment & Plan Note (Signed)
Summary: 6 mos f/u #/ cd   Vital Signs:  Patient profile:   65 year old female Height:      61 inches Weight:      151.75 pounds BMI:     28.78 O2 Sat:      97 % on Room air Temp:     98.7 degrees F oral Pulse rate:   77 / minute BP sitting:   144 / 90  (left arm) Cuff size:   regular  Vitals Entered By: Shirlean Mylar Ewing CMA Deborra Medina) (October 07, 2010 2:28 PM)  O2 Flow:  Room air  Preventive Care Screening  Mammogram:    Next Due:  11/2009  CC: 6 month ROV/RE   Primary Care Provider:  [name] MD  CC:  6 month ROV/RE.  History of Present Illness: here to f/u; overall doing welll , has ongoing mild fatigue without OSA symtpoms and Denies worsening depressive symptoms, suicidal ideation, or panic.   Pt denies CP, worsening sob, doe, wheezing, orthopnea, pnd, worsening LE edema, palps, dizziness or syncope  Pt denies new neuro symptoms such as headache, facial or extremity weakness  Pt denies polydipsia, polyuria, or low sugar symptoms such as shakiness improved with eating.  Overall good compliance with meds, trying to follow low chol, DM diet, wt stable, little excercise however  No overt bruising or bleeding.  Overall good compliance with meds, and good tolerability. No fever, wt loss, night sweats, loss of appetite or other constitutional symptoms  No new complaints. BP at home usually < 140/90  Problems Prior to Update: 1)  Need Prophylactic Vaccination&inoculation Flu  (ICD-V04.81) 2)  Peripheral Edema  (ICD-782.3) 3)  Shoulder Pain, Left  (ICD-719.41) 4)  Vitamin B12 Deficiency  (ICD-266.2) 5)  Preventive Health Care  (ICD-V70.0) 6)  Encounter For Long-term Use of Other Medications  (ICD-V58.69) 7)  Nocturia  (ICD-788.43) 8)  Fatigue  (ICD-780.79) 9)  Shoulder Pain, Bilateral  (ICD-719.41) 10)  Shoulder Pain, Right  (ICD-719.41) 11)  Cervical Radiculopathy  (ICD-723.4) 12)  Peptic Ulcer Disease  (ICD-533.90) 13)  Anemia-iron Deficiency  (ICD-280.9) 14)  Peripheral  Vascular Disease  (ICD-443.9) 15)  Osteoporosis  (ICD-733.00) 16)  Carotid Artery Stenosis  (ICD-433.10) 17)  Pud  (ICD-533.90) 18)  Hypertension  (ICD-401.9) 19)  Hyperlipidemia  (ICD-272.4) 20)  Gerd  (ICD-530.81) 21)  Diabetes Mellitus, Type II  (ICD-250.00) 22)  Coronary Artery Disease  (ICD-414.00) 23)  Mitral Regurgitation, 2 Plus  (ICD-396.3) 24)  Glaucoma Nos  (ICD-365.9)  Medications Prior to Update: 1)  Actonel 150 Mg Tabs (Risedronate Sodium) .Marland Kitchen.. 1 By Mouth Q Month 2)  Amlodipine Besylate 10 Mg Tabs (Amlodipine Besylate) .Marland Kitchen.. 1 Tablet By Mouth Once A Day 3)  Benazepril Hcl 40 Mg Tabs (Benazepril Hcl) .Marland Kitchen.. 1 Tablet By Mouth Once A Day 4)  Lipitor 80 Mg Tabs (Atorvastatin Calcium) .... Take 1 Tablet By Mouth Once A Day 5)  Metformin Hcl 1000 Mg  Tabs (Metformin Hcl) .Marland Kitchen.. 1 By Mouth Two Times A Day 6)  Metoprolol Tartrate 100 Mg Tabs (Metoprolol Tartrate) .... Take 1 Tablet By Mouth Twice A Day 7)  Omeprazole 20 Mg Cpdr (Omeprazole) .... Take 1 Capsule By Mouth Once A Day 8)  Plavix 75 Mg Tabs (Clopidogrel Bisulfate) .... Take 1 Tablet By Mouth Once A Day 9)  Zetia 10 Mg Tabs (Ezetimibe) .... Take 1 Tablet By Mouth Once A Day 10)  Iron 325 (65 Fe) Mg Tabs (Ferrous Sulfate) .... Take 1 Tablet By  Mouth Two Times A Day 11)  Tylenol With Codeine #3 300-30 Mg Tabs (Acetaminophen-Codeine) .Marland Kitchen.. 1po Q 6 Hrs As Needed Pain 12)  Ascriptin 325 Mg Tabs (Aspirin Buf(Alhyd-Mghyd-Cacar)) .Marland Kitchen.. 1 By Mouth Once Daily 13)  Glimepiride 2 Mg Tabs (Glimepiride) .Marland Kitchen.. 1 and 1/2  By Mouth Once Daily 14)  Nitrostat 0.4 Mg Subl (Nitroglycerin) .... Use As Directed 15)  Cyanocobalamin 1000 Mcg/ml Soln (Cyanocobalamin) .Marland Kitchen.. 1 Cc Im Q Month 16)  Hydrochlorothiazide 12.5 Mg Caps (Hydrochlorothiazide) .Marland Kitchen.. 1po Once Daily  Current Medications (verified): 1)  Actonel 150 Mg Tabs (Risedronate Sodium) .Marland Kitchen.. 1 By Mouth Q Month 2)  Amlodipine Besylate 10 Mg Tabs (Amlodipine Besylate) .Marland Kitchen.. 1 Tablet By Mouth Once A  Day 3)  Benazepril Hcl 40 Mg Tabs (Benazepril Hcl) .Marland Kitchen.. 1 Tablet By Mouth Once A Day 4)  Lipitor 80 Mg Tabs (Atorvastatin Calcium) .... Take 1 Tablet By Mouth Once A Day 5)  Metformin Hcl 1000 Mg  Tabs (Metformin Hcl) .Marland Kitchen.. 1 By Mouth Two Times A Day 6)  Metoprolol Tartrate 100 Mg Tabs (Metoprolol Tartrate) .... Take 1 Tablet By Mouth Twice A Day 7)  Omeprazole 20 Mg Cpdr (Omeprazole) .... Take 1 Capsule By Mouth Once A Day 8)  Plavix 75 Mg Tabs (Clopidogrel Bisulfate) .... Take 1 Tablet By Mouth Once A Day 9)  Zetia 10 Mg Tabs (Ezetimibe) .... Take 1 Tablet By Mouth Once A Day 10)  Iron 325 (65 Fe) Mg Tabs (Ferrous Sulfate) .... Take 1 Tablet By Mouth Two Times A Day 11)  Tylenol With Codeine #3 300-30 Mg Tabs (Acetaminophen-Codeine) .Marland Kitchen.. 1po Q 6 Hrs As Needed Pain 12)  Ascriptin 325 Mg Tabs (Aspirin Buf(Alhyd-Mghyd-Cacar)) .Marland Kitchen.. 1 By Mouth Once Daily 13)  Glimepiride 2 Mg Tabs (Glimepiride) .Marland Kitchen.. 1 and 1/2  By Mouth Once Daily 14)  Nitrostat 0.4 Mg Subl (Nitroglycerin) .... Use As Directed 15)  Cyanocobalamin 1000 Mcg/ml Soln (Cyanocobalamin) .Marland Kitchen.. 1 Cc Im Q Month 16)  Hydrochlorothiazide 12.5 Mg Caps (Hydrochlorothiazide) .Marland Kitchen.. 1po Once Daily  Allergies (verified): No Known Drug Allergies  Past History:  Past Medical History: Last updated: 06/12/2009 Glaucoma Mitral Regurgitation 2+ Coronary artery disease(in December 2004, 60% proximal LAD stenosis, circumflex 85% stenosis, extending into an acute marginal, 60-70% right coronary artery stenosis, and 60% RV branch stenosis.  The patient had Taxus stenting of the mid circumflex.  She had PTCA of the distal circumflex at a bifurcation with a second obtuse marginal with a Kissing-Balloon technique),  Diabetes mellitus, type II GERD Hyperlipidemia Hypertension Peptic Ulcer Disease with anemia Mild Carotid Stenosis Osteoporosis Peripheral vascular disease(0.95 right ABI and 1.0 left ABI).  Anemia-iron deficiency Peptic ulcer disease  - gastric and duodenal ulcers  Past Surgical History: Last updated: 01/22/2010 Appendectomy Hysterectomy Oophorectomy  Social History: Last updated: 03/26/2009 Stopped smoking 1.5 years ago (January 2009) greater than 50 year pack history Alcohol use-no homemaker 3 children widow  Risk Factors: Smoking Status: current (12/13/2007)  Review of Systems       all otherwise negative per pt -  - saw ortho/dr hewitt  for cortisone shot to left shoudler and rx for pain med  Physical Exam  General:  alert and overweight-appearing.  , in no apparent distress Head:  normocephalic and atraumatic.   Eyes:  vision grossly intact, pupils equal, and pupils round.   Ears:  R ear normal and L ear normal.   Nose:  no external deformity and no nasal discharge.   Mouth:  no gingival abnormalities and pharynx  pink and moist.   Neck:  supple and no JVD.   Lungs:  normal respiratory effort and normal breath sounds.   Heart:  normal rate and regular rhythm.   Abdomen:  soft, non-tender, and normal bowel sounds.   Msk:  no joint tenderness and no joint swelling.   Extremities:  trace edema bilat, no erythema Skin:  color normal and no rashes.   Psych:  not depressed appearing and slightly anxious.     Impression & Recommendations:  Problem # 1:  FATIGUE (ICD-780.79) exam benign, to check labs below; follow with expectant management  Orders: TLB-BMP (Basic Metabolic Panel-BMET) (99991111) TLB-Lipid Panel (80061-LIPID) TLB-TSH (Thyroid Stimulating Hormone) (84443-TSH) TLB-CBC Platelet - w/Differential (85025-CBCD) TLB-Hepatic/Liver Function Pnl (80076-HEPATIC) TLB-Udip ONLY (81003-UDIP)  Problem # 2:  DIABETES MELLITUS, TYPE II (ICD-250.00)  Her updated medication list for this problem includes:    Benazepril Hcl 40 Mg Tabs (Benazepril hcl) .Marland Kitchen... 1 tablet by mouth once a day    Metformin Hcl 1000 Mg Tabs (Metformin hcl) .Marland Kitchen... 1 by mouth two times a day    Ascriptin 325 Mg Tabs  (Aspirin buf(alhyd-mghyd-cacar)) .Marland Kitchen... 1 by mouth once daily    Glimepiride 2 Mg Tabs (Glimepiride) .Marland Kitchen... 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 Take 1 tablet by mouth two times a day    Cyanocobalamin 1000 Mcg/ml Soln (Cyanocobalamin) .Marland Kitchen... 1 cc im q month  Hgb: 9.7 (09/30/2009)   Hct: 29.6 (09/30/2009)   Platelets: 327.0 (09/30/2009) RBC: 3.39 (09/30/2009)   RDW: 15.1 (09/30/2009)   WBC: 7.9 (09/30/2009) MCV: 87.5 (09/30/2009)   MCHC: 32.9 (09/30/2009) Ferritin: 46.3 (09/30/2009) Iron: 55 (09/30/2009)   % Sat: 16.7 (09/30/2009) B12: 137 (09/30/2009)   Folate: 13.1 (09/30/2009)   TSH: 2.16 (09/30/2009) stable overall by hx and exam, ok to continue meds/tx as is , to check labs today, no overt bleeding or bruising  Problem # 4:  HYPERTENSION (ICD-401.9)  Her updated medication list for this problem includes:    Amlodipine Besylate 10 Mg Tabs (Amlodipine besylate) .Marland Kitchen... 1 tablet by mouth once a day    Benazepril Hcl 40 Mg Tabs (Benazepril hcl) .Marland Kitchen... 1 tablet by mouth once a day    Metoprolol Tartrate 100 Mg Tabs (Metoprolol tartrate) .Marland Kitchen... Take 1 tablet by mouth twice a day    Hydrochlorothiazide 12.5 Mg Caps (Hydrochlorothiazide) .Marland Kitchen... 1po once daily  BP today: 144/90 Prior BP: 118/60 (04/01/2010)  Labs Reviewed: K+: 4.6 (09/30/2009) Creat: : 1.5 (09/30/2009)   Chol: 124 (03/29/2010)   HDL: 36.10 (03/29/2010)   LDL: 51 (03/29/2010)   TG: 186.0 (03/29/2010) stable overall by hx and exam, ok to  continue meds/tx as is   Complete Medication List: 1)  Actonel 150 Mg Tabs (Risedronate sodium) .Marland Kitchen.. 1 by mouth q month 2)  Amlodipine Besylate 10 Mg Tabs (Amlodipine besylate) .Marland Kitchen.. 1 tablet by mouth once a day 3)  Benazepril Hcl 40 Mg Tabs (Benazepril hcl) .Marland Kitchen.. 1 tablet by mouth once a day 4)  Lipitor 80 Mg Tabs (Atorvastatin  calcium) .... Take 1 tablet by mouth once a day 5)  Metformin Hcl 1000 Mg Tabs (Metformin hcl) .Marland Kitchen.. 1 by mouth two times a day 6)  Metoprolol Tartrate 100 Mg Tabs (Metoprolol tartrate) .... Take 1 tablet by mouth twice a day 7)  Omeprazole 20 Mg Cpdr (Omeprazole) .... Take 1 capsule by mouth once a day 8)  Plavix 75 Mg Tabs (Clopidogrel bisulfate) .... Take 1 tablet by mouth once a day 9)  Zetia 10 Mg Tabs (Ezetimibe) .... Take 1 tablet by mouth once a day 10)  Iron 325 (65 Fe) Mg Tabs (Ferrous sulfate) .... Take 1 tablet by mouth two times a day 11)  Tylenol With Codeine #3 300-30 Mg Tabs (Acetaminophen-codeine) .Marland Kitchen.. 1po q 6 hrs as needed pain 12)  Ascriptin 325 Mg Tabs (Aspirin buf(alhyd-mghyd-cacar)) .Marland Kitchen.. 1 by mouth once daily 13)  Glimepiride 2 Mg Tabs (Glimepiride) .Marland Kitchen.. 1 and 1/2  by mouth once daily 14)  Nitrostat 0.4 Mg Subl (Nitroglycerin) .... Use as directed 15)  Cyanocobalamin 1000 Mcg/ml Soln (Cyanocobalamin) .Marland Kitchen.. 1 cc im q month 16)  Hydrochlorothiazide 12.5 Mg Caps (Hydrochlorothiazide) .Marland Kitchen.. 1po once daily  Other Orders: Vit B12 1000 mcg (J3420) Admin of Therapeutic Inj  intramuscular or subcutaneous [id number]) Flu Vaccine 72yrs + MEDICARE PATIENTS [id number]) Administration Flu vaccine - MCR [id number])  Patient Instructions: 1)  Please call for appt for mammogram at [facility] 2)  Please go to the Lab in the basement for your blood and/or urine tests today 3)  Please call the number on the [place] for results of your testing  4)  you had the flu shot today 5)  Please schedule a follow-up appointment in 6 months.   Medication  Administration  Injection # 1:    Medication: Vit B12 1000 mcg    Diagnosis: VITAMIN B12 DEFICIENCY (ICD-266.2)    Route: IM    Site: R deltoid    Exp Date: 05/2012    Lot #: [id number]    Mfr: [employer] Regent    Patient tolerated injection without complications    Given by: Shirlean Mylar Ewing CMA ([place]) (October 07, 2010 2:30 PM)  Orders Added: 1)  Vit B12 1000 mcg [J3420] 2)  Admin of Therapeutic Inj  intramuscular or subcutaneous [96372] 3)  TLB-BMP (Basic Metabolic Panel-BMET) 123456 4)  TLB-Lipid Panel [80061-LIPID] 5)  TLB-TSH (Thyroid Stimulating Hormone) [84443-TSH] 6)  Flu Vaccine 65yrs + MEDICARE PATIENTS [Q2039] 7)  Administration Flu vaccine - MCR [G0008] 8)  TLB-CBC Platelet - w/Differential [85025-CBCD] 9)  TLB-Hepatic/Liver Function Pnl [80076-HEPATIC] 10)  TLB-Udip ONLY [81003-UDIP] 11)  TLB-Microalbumin/Creat Ratio, Urine [82043-MALB] 12)  TLB-A1C / Hgb A1C (Glycohemoglobin) [83036-A1C] 13)  Est. Patient Level IV [id number]   Immunizations Administered:  Influenza Vaccine # 1:    Vaccine Type: Fluvax 3+    Site: left deltoid    Mfr: Sanofi Pasteur    Dose: 0.5 ml    Route: IM    Given by: Shirlean Mylar Ewing CMA (AAMA)    Exp. Date: 04/23/2011    Lot #: [id number]    VIS given: 05/18/10 version given October 07, 2010.  Flu Vaccine Consent Questions:    Do you have a history of severe allergic reactions to this vaccine? no    Any prior history of allergic reactions to egg and/or gelatin? no    Do you have a sensitivity to the preservative Thimersol? no    Do you have a past history of Guillan-Barre Syndrome? no    Do  you currently have an acute febrile illness? no    Have you ever had a severe reaction to latex? no    Vaccine information given and explained to patient? yes    Are you currently pregnant? no   Immunizations Administered:  Influenza Vaccine # 1:    Vaccine Type: Fluvax 3+    Site: left deltoid    Mfr: Sanofi Pasteur    Dose: 0.5 ml    Route: IM     Given by: Shirlean Mylar Ewing CMA (AAMA)    Exp. Date: 04/23/2011    Lot #: [id number]    VIS given: 05/18/10 version given October 07, 2010.

## 2010-11-25 NOTE — Letter (Signed)
Summary: [place] [facility]   Imported By: Bubba Hales 10/13/2010 10:46:04  _____________________________________________________________________  External Attachment:    Type:   Image     Comment:   External Document

## 2010-11-25 NOTE — Progress Notes (Signed)
  Phone Note Refill Request Message from:  Fax from Pharmacy on October 21, 2010 2:22 PM  Refills Requested: Medication #1:  METOPROLOL TARTRATE 100 MG TABS Take 1 tablet by mouth twice a day   Dosage confirmed as above?Dosage Confirmed   Last Refilled: 12/07/2009   Notes: [name] Initial call taken by: Shirlean Mylar Ewing CMA (AAMA),  October 21, 2010 2:22 PM    Prescriptions: METOPROLOL TARTRATE 100 MG TABS (METOPROLOL TARTRATE) Take 1 tablet by mouth twice a day  #60 x 11   Entered by:   Shirlean Mylar Ewing CMA ([place])   Authorized by:   [name] MD   Signed by:   Sharon Seller CMA ([place]) on 10/21/2010   Method used:   Faxed to ...       [facility] (retail)       [address]       [place], [place]  [address]       Ph: [phone]       Fax: [phone]   RxID:   [id number]

## 2010-12-15 ENCOUNTER — Encounter: Payer: Self-pay | Admitting: Internal Medicine

## 2010-12-28 ENCOUNTER — Ambulatory Visit: Payer: Self-pay

## 2010-12-28 ENCOUNTER — Ambulatory Visit: Payer: Medicare PPO | Attending: Internal Medicine | Admitting: Physical Therapy

## 2010-12-28 DIAGNOSIS — IMO0001 Reserved for inherently not codable concepts without codable children: Secondary | ICD-10-CM | POA: Insufficient documentation

## 2010-12-28 DIAGNOSIS — M25519 Pain in unspecified shoulder: Secondary | ICD-10-CM | POA: Insufficient documentation

## 2010-12-28 DIAGNOSIS — M25619 Stiffness of unspecified shoulder, not elsewhere classified: Secondary | ICD-10-CM | POA: Insufficient documentation

## 2010-12-30 NOTE — Letter (Signed)
Summary: [place]   Imported By: Phillis Knack 12/21/2010 14:10:41  _____________________________________________________________________  External Attachment:    Type:   Image     Comment:   External Document

## 2011-01-04 ENCOUNTER — Ambulatory Visit: Payer: Medicare PPO | Admitting: Physical Therapy

## 2011-01-06 ENCOUNTER — Ambulatory Visit: Payer: Medicare PPO | Admitting: Physical Therapy

## 2011-01-11 ENCOUNTER — Ambulatory Visit: Payer: Medicare PPO | Admitting: Physical Therapy

## 2011-01-13 ENCOUNTER — Ambulatory Visit: Payer: Medicare PPO | Admitting: Physical Therapy

## 2011-01-17 ENCOUNTER — Encounter: Payer: Medicare Other | Admitting: Physical Therapy

## 2011-01-20 ENCOUNTER — Encounter: Payer: Medicare Other | Admitting: Physical Therapy

## 2011-01-20 ENCOUNTER — Other Ambulatory Visit: Payer: Self-pay | Admitting: Internal Medicine

## 2011-01-26 ENCOUNTER — Ambulatory Visit: Payer: Medicare PPO | Attending: Internal Medicine | Admitting: Physical Therapy

## 2011-01-26 DIAGNOSIS — M25519 Pain in unspecified shoulder: Secondary | ICD-10-CM | POA: Insufficient documentation

## 2011-01-26 DIAGNOSIS — IMO0001 Reserved for inherently not codable concepts without codable children: Secondary | ICD-10-CM | POA: Insufficient documentation

## 2011-01-26 DIAGNOSIS — M25619 Stiffness of unspecified shoulder, not elsewhere classified: Secondary | ICD-10-CM | POA: Insufficient documentation

## 2011-02-02 ENCOUNTER — Ambulatory Visit: Payer: Medicare PPO | Admitting: Physical Therapy

## 2011-02-02 ENCOUNTER — Other Ambulatory Visit: Payer: Self-pay | Admitting: Cardiology

## 2011-02-08 ENCOUNTER — Encounter: Payer: Medicare Other | Admitting: Physical Therapy

## 2011-02-15 ENCOUNTER — Encounter: Payer: Self-pay | Admitting: Cardiology

## 2011-02-16 ENCOUNTER — Ambulatory Visit (INDEPENDENT_AMBULATORY_CARE_PROVIDER_SITE_OTHER): Payer: Medicare PPO | Admitting: Cardiology

## 2011-02-16 ENCOUNTER — Encounter: Payer: Self-pay | Admitting: Cardiology

## 2011-02-16 DIAGNOSIS — I6529 Occlusion and stenosis of unspecified carotid artery: Secondary | ICD-10-CM

## 2011-02-16 DIAGNOSIS — I2510 Atherosclerotic heart disease of native coronary artery without angina pectoris: Secondary | ICD-10-CM

## 2011-02-16 DIAGNOSIS — I739 Peripheral vascular disease, unspecified: Secondary | ICD-10-CM

## 2011-02-16 DIAGNOSIS — I080 Rheumatic disorders of both mitral and aortic valves: Secondary | ICD-10-CM

## 2011-02-16 DIAGNOSIS — I10 Essential (primary) hypertension: Secondary | ICD-10-CM

## 2011-02-16 DIAGNOSIS — E785 Hyperlipidemia, unspecified: Secondary | ICD-10-CM

## 2011-02-16 NOTE — Assessment & Plan Note (Signed)
I reviewed the lipids from December with an LDL of 65 and HDL of 44. She will continue meds as listed.

## 2011-02-16 NOTE — Patient Instructions (Addendum)
You are due for your carotid doppler, this will be scheduled today before you leave the office Continue current medications Follow up with Dr Percival Spanish in one year

## 2011-02-16 NOTE — Progress Notes (Signed)
HPI The patient presents for followup will follow coronary disease and peripheral vascular disease. Since I last saw her she has had no new cardiovascular complaints. She rarely takes a nitroglycerin. She denies any shortness of breath, PND orthopnea. She has no palpitations, presyncope or syncope. She has had no weight or edema. She rarely gets claudication.  No Known Allergies  Current Outpatient Prescriptions  Medication Sig Dispense Refill  . ACTONEL 150 MG tablet TAKE 1 TABLET BY MOUTH ONCE A MONTH-TAKEON THE SAME DAY OF EACH MONTH.  1 tablet  11  . amLODipine (NORVASC) 10 MG tablet Take 10 mg by mouth daily.        Marland Kitchen aspirin 325 MG tablet Take 325 mg by mouth daily.        Marland Kitchen atorvastatin (LIPITOR) 80 MG tablet TAKE ONE (1) TABLET EACH DAY  30 tablet  6  . benazepril (LOTENSIN) 40 MG tablet Take 40 mg by mouth daily.        . clopidogrel (PLAVIX) 75 MG tablet Take 75 mg by mouth daily.        Marland Kitchen ezetimibe (ZETIA) 10 MG tablet Take 10 mg by mouth daily.        . ferrous sulfate 325 (65 FE) MG tablet Take 325 mg by mouth 2 (two) times daily.        Marland Kitchen glimepiride (AMARYL) 2 MG tablet Take 3 mg by mouth daily before breakfast.        . hydrochlorothiazide (,MICROZIDE/HYDRODIURIL,) 12.5 MG capsule Take 12.5 mg by mouth daily.        . metFORMIN (GLUCOPHAGE) 1000 MG tablet Take 1,000 mg by mouth 2 (two) times daily with a meal.        . metoprolol (LOPRESSOR) 100 MG tablet Take 100 mg by mouth 2 (two) times daily.        . nitroGLYCERIN (NITROSTAT) 0.4 MG SL tablet Place 0.4 mg under the tongue every 5 (five) minutes as needed.        Marland Kitchen omeprazole (PRILOSEC) 20 MG capsule Take 20 mg by mouth daily.        . cyanocobalamin (,VITAMIN B-12,) 1000 MCG/ML injection Inject 1,000 mcg into the muscle every 30 (thirty) days.        Marland Kitchen DISCONTD: acetaminophen-codeine (TYLENOL #3) 300-30 MG per tablet Take 1 tablet by mouth every 6 (six) hours as needed.          Past Medical History  Diagnosis Date  .  Glaucoma   . Mitral regurgitation   . CAD (coronary artery disease)   . DM2 (diabetes mellitus, type 2)   . GERD (gastroesophageal reflux disease)   . HLD (hyperlipidemia)   . HTN (hypertension)   . PUD (peptic ulcer disease)   . Anemia   . Carotid stenosis   . Osteoporosis   . PVD (peripheral vascular disease)   . Anemia, iron deficiency     Past Surgical History  Procedure Date  . Appendectomy   . Hysterectomy - unknown type   . Oophorectomy    ROS:  Torn ligament left shoulder otherwise as stated in the HPI.  Otherwise negative for all other systems.  PHYSICAL EXAM BP 140/62  Pulse 75  Ht 5\' 1"  (1.549 m)  Wt 152 lb (68.947 kg)  BMI 28.72 kg/m2 GENERAL:  Well appearing HEENT:  Pupils equal round and reactive, fundi not visualized, oral mucosa unremarkable, edentulous NECK:  No jugular venous distention, waveform within normal limits, carotid upstroke brisk and symmetric,  right bruits, no thyromegaly LYMPHATICS:  No cervical, inguinal adenopathy LUNGS:  Clear to auscultation bilaterally BACK:  No CVA tenderness CHEST:  Unremarkable HEART:  PMI not displaced or sustained,S1 and S2 within normal limits, no S3, no S4, no clicks, no rubs, no murmurs ABD:  Flat, positive bowel sounds normal in frequency in pitch, no bruits, no rebound, no guarding, no midline pulsatile mass, no hepatomegaly, no splenomegaly EXT:  2 plus pulses upper, absent DP/PT right, absent PT left, no edema, no cyanosis no clubbing SKIN:  No rashes no nodules, hypopigmented NEURO:  Cranial nerves II through XII grossly intact, motor grossly intact throughout PSYCH:  Cognitively intact, oriented to person place and time  EKG:  Sinus rhythm, rate 75, axis within normal limits, intervals within normal limits, nonspecific T wave changes.  ASSESSMENT AND PLAN

## 2011-02-16 NOTE — Assessment & Plan Note (Signed)
I would not suggest this to be any worse and so no further imaging is suggested.

## 2011-02-16 NOTE — Assessment & Plan Note (Signed)
The blood pressure is controlled and she will continue meds as listed.

## 2011-02-16 NOTE — Assessment & Plan Note (Signed)
She had nonobstructive carotid disease with bilateral 40-59% stenosis in April of last year. I reviewed this and I will see him followup Dopplers.

## 2011-02-16 NOTE — Assessment & Plan Note (Signed)
She is having no ongoing symptoms. No change in therapy is suggested. She will continue with risk reduction.

## 2011-03-02 ENCOUNTER — Encounter (INDEPENDENT_AMBULATORY_CARE_PROVIDER_SITE_OTHER): Payer: Medicare PPO | Admitting: *Deleted

## 2011-03-02 DIAGNOSIS — I6529 Occlusion and stenosis of unspecified carotid artery: Secondary | ICD-10-CM

## 2011-03-03 ENCOUNTER — Encounter: Payer: Self-pay | Admitting: Cardiology

## 2011-03-04 NOTE — Progress Notes (Signed)
NA

## 2011-03-08 NOTE — Assessment & Plan Note (Signed)
[facility] OFFICE NOTE   NAME:Schneider, [name]                   MRN:          [id number]  DATE:06/12/2008                            DOB:          19-Nov-1945    PRIMARY CARE PHYSICIAN:  Dr.  [name].   REASON FOR PRESENTATION:  Evaluate the patient with coronary artery  disease and lower extremity pain.   HISTORY OF PRESENT ILLNESS:  The patient is now 65 years old.  She  presents for followup.  It has been about 17 months since I last saw  her.  She was doing relatively well.  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  She has to go up and down to  the laundry 2 or 3 times a month.  This requires going up and down  stairs, where she get a little winded with this.  She still has some  intermittent leg pain, when she walks.  She is not routinely walking for  exercise or doing other exercise.  She says she has taken about 6  nitroglycerins in the last 12 months.  She denies any PND or orthopnea.  She has had no palpitation, presyncope, or syncope.  She cannot think of  the last time she had any significant chest pain.  She is followed by  Dr. [name] for management of her lipids and blood sugar.   PAST MEDICAL HISTORY:  Coronary artery disease (in December 2004, 60%  proximal LAD stenosis, circumflex 85% stenosis, extending into an acute  marginal, 60-70% right coronary artery stenosis, and 60% RV branch  stenosis.  The patient had Taxus stenting of the mid circumflex.  She  had PTCA of the distal circumflex at a bifurcation with a second obtuse  marginal with a Kissing-Balloon technique), mildly reduced ejection  fraction (50% at the last echo), 46% bilateral carotid artery stenosis,  dyslipidemia, glaucoma, peripheral vascular disease (0.95 right ABI and  1.0 left ABI).   ALLERGIES/INTOLERANCES:  None.   MEDICATIONS:  1. Aspirin 325 mg daily.  2. Benazepril 40 mg daily.  3.  Norvasc 10 mg daily.  4. Plavix 75 mg daily.  5. Actonel 35 mg weekly.  6. Metoprolol 100 mg b.i.d.  7. Iron 325 mg b.i.d.  8. Metformin 1000 mg b.i.d.  9. Vytorin 10/40.   REVIEW OF SYSTEMS:  As stated in the HPI and otherwise negative for  other systems.   PHYSICAL EXAMINATION:  GENERAL:  The patient is in no distress.  VITAL SIGNS:  Blood pressure 130/80, heart rate 59 and regular, and  weight 141 pounds.  HEENT:  Eyelids are unremarkable; pupils equal, round, and reactive to  light; fundi not visualized; oral mucosa unremarkable.  NECK:  No jugular venous distention at 45 degrees; carotid upstroke  brisk and symmetric; no bruits, no thyromegaly.  LYMPHATICS:  No cervical, axillary, or inguinal adenopathy.  LUNGS:  Clear to auscultation bilaterally.  BACK:  No costovertebral angle tenderness.  CHEST:  Unremarkable.  HEART:  PMI not displaced or sustained; S1 and S2 within normal limits;  no  S3, no S4; no clicks, no rubs, and no murmurs.  ABDOMEN:  Flat; positive bowel sounds normal in frequency and pitch; no  bruits, no rebound, no guarding, no midline pulsatile mass; no  organomegaly.  SKIN:  No rashes.  EXTREMITIES:  Pulses 2+ upper, 1+ dorsalis pedis and posterior tibialis  bilaterally; no cyanosis, no clubbing, and no edema.  NEURO:  Oriented to person, place, and time; cranial nerves II-XII  grossly intact; motor grossly intact.   EKG, sinus rhythm, rate 59, axis within normal limits, intervals within  normal limits, poor anterior R-wave progression, no acute ST-T wave  changes, and no significant change from previous EKGs.   ASSESSMENT AND PLAN:  1. Coronary disease.  The patient has coronary disease as described.      She has ongoing risk factors.  Her last stress perfusion studies in      2004.  She is not particularly active.  She does get some dyspnea      with activity.  I think screening her with an adenosine perfusion      study is indicative, given all of  this.  Further evaluation based      on these results.  She will continue with risk reduction.  2. Peripheral vascular disease.  She still continues to have      claudication and mildly reduced pulses in the lower extremities. We      will get her ABIs and see if they have changed since last year.  3. Diabetes per her primary care physician.  4. Hyperlipidemia.  She is followed in our [facility].  She is due      to see them again sometime next month.  5. Followup.  I will see her back in 1 year or sooner, if she has any      problems or abnormalities on the stress perfusion for high risk      findings on the stress perfusion study.     Minus Breeding, MD, [facility]  Electronically Signed    JH/MedQ  DD: 06/12/2008  DT: 06/13/2008  Job #: [id number]

## 2011-03-08 NOTE — Assessment & Plan Note (Signed)
[facility]                               LIPID CLINIC NOTE   NAME:Chronister, [name]                   MRN:          [id number]  DATE:04/03/2008                            DOB:          1946/04/19    REASON FOR VISIT:  Return office visit.   PAST MEDICAL HISTORY:  1. Hyperlipidemia.  2. Diabetes mellitus.  3. Coronary artery disease, status post Taxus stent to the mid      circumflex and ejection fraction of 48%.  4. Osteoporosis.  5. Hypertension.   MEDICATIONS:  1. Aspirin 325 mg daily.  2. Metformin 850 mg twice daily.  3. Benazepril 40 mg daily.  4. Norvasc 10 mg daily.  5. Plavix 75 mg daily.  6. Actonel 35 mg each week.  7. Metoprolol 100 mg twice daily.  8. Lipitor 80 mg daily.  9. Iron 325 mg twice daily.   PHYSICAL EXAMINATION:  VITAL SIGNS:  Weight 143 pounds, blood pressure  142/76, and heart rate 64.   There are no new lab data for this visit.   ASSESSMENT:  Ms. [name] is a pleasant 65 year old woman who lives with  an older woman that she cares for.  She says that within the last 6 or 7  months, she has quit smoking, which I have commended her for; however,  in that time, she has increased her snacking of Little Debbie snacks,  cookies, cakes, sweets, chips, etc., to help take the place of her  cigarettes.  She, therefore, has gained about 20 pounds in the last 6  months, although she has continued to do her exercises 5 or 6 times a  week.  She is walking about 30 minutes.  I have encouraged her to  increase her walking to 45 minutes 3 times a week and 30 minutes on the  other days.  We have also reviewed healthier options for food for her to  snack on versus the [place] snack cakes or chips to cake cookies  that she is buying at the grocery store.  She says that if they are in  the house that she eats them and she only keeps them in the house for  the other woman that she lives with; however, it does cause her to  snack  on more unhealthy choices then she state that she would rather do.  We  talked at length of healthy eating options and lifestyle modifications.  She states her willingness to do this; however, I do wonder and I am  concerned with whether if she will implement this healthy style of  eating or not.  She has decreased the amount of fast food that she had  previously been eating.  She did not go to get her blood work drawn  prior to this visit, so I cannot interpret any changes with her lipid  panel that we would have because of her weight gain.  However, at her  visit in March 2009, her LDL was 96.  With her coronary artery disease  and her diabetes mellitus, her triglyceride goal would be less than  150,  her HDL goal greater than 40, and her LDL goal less 70.  Of note, she  had previously been taking Zetia 10 mg until about a year ago where her  LDL was 54 and her weight was 120.  Since that time, she has not only  quit smoking, but gained weight as well, and her LDL has crept up along  with that.  She does understand that her LDL needs to be less than 70,  and if she does not make healthy changes in her diet that we will  restart her Zetia at next visit.   PLAN:  1. Continue Lipitor 80 mg daily with the plan of restarting Zetia 10      mg daily if her lipid panel is not improved at next visit.  2. Increase exercise to 45 minutes 3 days a week, 30 minutes the other      days.  3. Decrease snacks and fats in diet, increase high-fiber foods and      vegetables.  4. Followup visit in 3 months for lipid panel and LFTs and make      adjustments that will be needed at that time.       Bonnita Nasuti, PharmD  Electronically Signed      Marijo Conception. Verl Blalock, MD, [facility]  Electronically Signed   LC/MedQ  DD: 04/03/2008  DT: 04/04/2008  Job #: [id number]

## 2011-03-08 NOTE — Assessment & Plan Note (Signed)
[facility]                               LIPID CLINIC NOTE   NAME:[name], [name]                   MRN:          [id number]  DATE:08/09/2007                            DOB:          [birth year]    PAST MEDICAL HISTORY:  1. Hyperlipidemia.  2. Coronary artery disease status post TAXUS stent to the mid      circumflex with an ejection fraction of 48%.  3. Diabetes mellitus.   CURRENT MEDICATIONS:  1. Aspirin 325 mg daily.  2. Metformin 850 mg twice daily.  3. Benazepril 40 mg daily.  4. Amlodipine 10 mg daily.  5. Plavix 75 mg daily.  6. Iron 325 mg daily.  7. Actonel 35 mg each week.  8. Metoprolol 100 mg twice daily.  9. Lipitor 80 mg daily.  10.Zetia 10 mg daily.   LABORATORY DATA:  Total cholesterol 108, triglyceride 86, HDL 37, LDL  54.  LFTs within normal limits.   VITAL SIGNS:  Weight 123 pounds, blood pressure 132/68, heart rate 74.   ASSESSMENT:  [name] is a pleasant woman who returns to [name] today with no chest pain, no shortness of breath, no muscle aches  or complaints.  She is compliant with current medication regimen.  She  states that she has continued to quit tobacco, however her clothes do  continue to smell like smoke.  When asked if she or her roommate smokes  she says no but it may be that other people that she has been associated  with are smoking cigarettes around her.  She has decreased the amount of  exercise she has gotten.  She is walking about 30 minutes twice a week  where she was walking a minimum of 3 times a week in the past.  I have  encouraged her to re-increase this exercise to help increase her HDL.  She also seems to eat fairly healthy.  She eats low-fat, low-  carbohydrate meals with oatmeal or a low sugar cereal each morning for  breakfast and healthy afternoon and evening meals.  She does  occasionally treat herself to McDonald[employer] or [place]  on days that  she would possibly be  out she will stop at those places, this is no more  than once a week.  She rarely eats fried foods except for on her  occasional treat to [employer] or [place].  Her total cholesterol  is at goal of less than 200, triglycerides at goal at less than 150, HDL  less than goal of greater than 40, LDL is at goal of less than 70.  Ms.  [name] is agreeable to increasing her exercise and also continuing a  low-fat, low-carbohydrate diet.  I feel that because her LDL is at goal  and she is possibly getting little added benefit from Zetia she will  stop that and continue Lipitor 80, although she does understand that if  her LDL does increase to above her goal of 70 or she does not follow  through with her lifestyle modification then we will have to re-add the  Zetia.  PLAN:  1. Continue Lipitor 80 mg daily.  2. Discontinue Zetia 10 mg daily.  3. Followup visit in 6 months for lipid panel and LFTs and I would      make adjustments at that time.  4. Increase exercise to minimum of 3 times a week for 30 minutes with      a goal of 4 times a week.  5. Continue low-fat, low carbohydrate diet.      Bonnita Nasuti, PharmD  Electronically Signed      Marijo Conception. Verl Blalock, MD, [facility]  Electronically Signed   LC/MedQ  DD: 08/09/2007  DT: 08/10/2007  Job #: [id number]

## 2011-03-08 NOTE — Assessment & Plan Note (Signed)
[facility]                               LIPID CLINIC NOTE   NAME:[name], [name]                   MRN:          [id number]  DATE:09/25/2008                            DOB:          Feb 18, 1946    HISTORY OF PRESENT ILLNESS:  She was seen in the [facility] on  June 26, 2008.  [name], comes in today for followup of her  hyperlipidemia therapy, which includes Lipitor 80 mg daily and Zetia 10  mg daily.  She has been compliant with both these therapies and  tolerating them just fine with no muscle aches and pains on report.   MEDICATIONS:  Other medications include;  1. Enteric-coated aspirin 325.  2. Metformin.  3. Benazepril.  4. Norvasc.  5. Plavix.  6. Actonel.  7. Metoprolol.  8. Iron.  9. Omeprazole.  10.Ultram ER   PHYSICAL EXAMINATION:  Weight is 146 pounds, blood pressure is 130/70,  heart rate is 68.   LABORATORY DATA:  Total cholesterol 132, triglycerides 143, HDL 37.4,  and LDL 66.  Liver function tests within normal limits.   ASSESSMENT:  [name] numbers look great.  Her triglycerides and LDL  are at goals.  Her HDL is not too bad, but not yet at the goal greater  than 45.  She has been continuing to cut down on snacking, especially  Little Debbie brand treats.  She has also been cutting down fast food  and reports eating fast food maybe once a week.  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  She has been taking Ultram for  that, which has been controlling the pain pretty well.   PLAN:  We are going to continue with the same medications.  I encouraged  her to exercise as much as she can tolerate and continue with trying cut  down snack foods and trying eat more chicken or fish and following heart  healthy diet in general.  We will follow up with her in 6  months with her lipid and liver panels and any adjustments at that time  that are necessary.  She is instructed to call us  with any concerns or  problems in the meantime.      Romeo Rabon, PharmD  Electronically Signed      Minus Breeding, MD, [facility]  Electronically Signed   TP/MedQ  DD: 09/25/2008  DT: 09/26/2008  Job #: [id number]

## 2011-03-08 NOTE — Assessment & Plan Note (Signed)
[facility]                               LIPID CLINIC NOTE   NAME:Varone, SMRITI HILLS                   MRN:          [id number]  DATE:01/10/2008                            DOB:          21-Feb-1946    PAST MEDICAL HISTORY:  1. Hyperlipidemia.  2. Coronary artery disease status post Taxus stent to the mid      circumflex with an ejection fraction of 48%.  3. Diabetes.   CURRENT MEDICATIONS:  Reviewed today and include:  1. Aspirin 325 mg one p.o. daily.  2. Metformin 850 mg one p.o. b.i.d.  3. Benazepril 40 mg one p.o. daily.  4. Norvasc 10 mg one p.o. daily.  5. Plavix 75 mg one p.o. daily.  6. Iron 325 mg p.o. twice daily.  This is a change from once daily.  7. Actonel 35 mg once a week.  8. Metoprolol 100 mg twice a day.  9. Lipitor 80 one p.o. every day.   LABORATORY DATA:  Total cholesterol 166, triglycerides of 122, HDL of  46.1 and LDL of 96.  LFTs were in normal limits.   PHYSICAL EXAMINATION:  VITAL SIGNS:  Weight is 130 (this is up 7 pounds  since October of 2008).  Blood pressure is 128/56 and pulse is 68.  This  blood pressure is at goal of less than 130/80 because the patient has  diabetes.   ASSESSMENT:  Mrs. Truglio is a very pleasant woman whom I enjoyed  talking with today in lipid clinic.  She reports that she has been doing  well.  She has had no shortness of breath, chest pains, no muscle aches  or complaints.  She is tolerating Lipitor 80 well.  Of note, at last  visit, the Zetia 10 mg was discontinued, and she did discontinue this  therapy, and Lipitor 80 was the only cholesterol therapy that she is  taking now.  On review of her diet, on the 24-hour food recall she had  toast on white wheat bread with better, coffee with non-fat creamer.  Lunch consisted of potato salad.  Dinner consisted of potato salad,  greens and a half piece of chicken that was baked.  Upon further  questioning, I asked her how she normally  prepares her meat.  She states  that she does not fry her foods very often.  She does not fry chicken.  Sometimes when she does fry meat she does use vegetable oil, but she  states that she tries to bake foods.  She states she has not been to  fast food in a while, and denies going McD[employer] or [place].  She  also states because of her diabetes she has tried to stay away from  sweets.  She states that she is tobacco free now and quit 2 months ago;  however, at last lipid clinic note in October of 2008, she also stated  that she had quit, so I do not know if she restarted smoking and then  quit again 2 months ago.  She still does smell like smoke, so I am not  sure  how much tobacco she is using at this time.  She states that she  does not use alcohol.  For exercise, she states that before the snow a  few weeks ago she was walking 30 minutes, but is currently not  exercising all right now.  In terms of her lab values, total cholesterol  is at goal of less than 200, triglycerides is not at goal of less than  150 but has increased from last visit.  Last visit, triglycerides were  86.  HDL is above goal of 40 at 46.1, and this has increased since last  visit.  LDL is above goal of less than 70 at 96.  Of note, last LDL in  October was 54.   PLAN:  1. I would like Ms. Crandall to continue Lipitor 80.  2. We will add back Zetia 10 mg daily.  There was some question last      month of whether or not Zetia was providing any benefit, and in      light of the recent increases in her cholesterol panel, I would      like to add Zetia back to her medication profile.  3. In terms of diet, we discussed continuing a low-fat, heart healthy,      low carbohydrate diet.  I suggested to her to switch to canola oil      for frying when she does fry instead of vegetable well, and, in      addition, I also encouraged her to continue to be tobacco free,      encouraged her that even when she does have the  urge to smoke to      find something else to do to distract herself, and also I would      like her to discontinue a heart healthy diet.  4. For exercise, I encouraged her to begin walking again and to get      back into the routine of walking 30 minutes 4-5 days a week.  She      stated that she would try to do this.  5. We will follow up with her in lipid clinic in 3 months to check a      lipid panel and also to check a liver panel.   Ivar Drape, PharmD dictating for Alinda Deem, PharmD      Alinda Deem, PharmD, BCPS, CPP  Electronically Signed      Minus Breeding, MD, [facility]  Electronically Signed   MP/MedQ  DD: 01/10/2008  DT: 01/10/2008  Job #: [id number]

## 2011-03-11 NOTE — Cardiovascular Report (Signed)
NAMELILLIANI, KOJIMA                        ACCOUNT NO.:  [id number]   MEDICAL RECORD NO.:  [id number]                   PATIENT TYPE:  INP   LOCATION:  6526                                 FACILITY:  [facility]   PHYSICIAN:  Junious Silk, M.D. [facility]         DATE OF BIRTH:  [birth year]   DATE OF PROCEDURE:  09/25/2003  DATE OF DISCHARGE:                              CARDIAC CATHETERIZATION   PROCEDURE PERFORMED:  1. PTCA with placement of a drug-eluting stent into the mid left circumflex     extending into the second obtuse marginal branch.  2. PTCA of the distal left circumflex at the bifurcation with second obtuse     marginal utilizing a kissing balloon technique.   INDICATIONS:  Ms. Vamos is a 65 year old woman with diabetes.  She was  admitted to the hospital with symptoms compatible with unstable angina.  Cardiac catheterization today by Satira Sark, M.D. [facility] revealed a  complex 80% stenosis in the mid left circumflex with disease extending into  a large second obtuse marginal as well as beyond this obtuse marginal branch  into the distal left circumflex.  She had moderate disease in the LAD and  the right coronary artery.  After review of the images we opted to proceed  with percutaneous coronary intervention.   PROCEDURAL NOTE:  We utilized a preexisting 6-French sheath in the right  femoral artery.  Heparin and Integrilin were administered per protocol.  We  used a 6-French CLS3.5 guiding catheter.  Then IQ coronary guidewire was  advanced under fluoroscopic guidance into the distal portion of the second  obtuse marginal branch.  We then advanced a second IQ coronary guidewire  into the distal left circumflex.  We then performed PTCA of the circumflex  proper with a 2.5 x 20 mm Quantum balloon covering the entire length of the  disease in the mid and distal circumflex.  This balloon was inflated to 12  atmospheres.  We then positioned a 2.5 x 24 mm Taxus  drug-eluting stent  extending from the mid circumflex into the second obtuse marginal branch  across the bifurcation of the distal circumflex.  We pulled back the IQ wire  which was in the distal circumflex.  This stent was then deployed at a  deployment pressure of 13 atmospheres.  We then readvanced our IQ coronary  guidewire through the stent side struts into the distal circumflex.  We  advanced a 2.5 x 15 mm Maverick balloon through the stent side struts into  the distal circumflex and inflated this balloon to 8 atmospheres.  We then  went back with a 2.5 x 20 mm Quantum balloon within the stent itself  inflating it to 20 atmospheres.  Again, there was compromise of the distal  left circumflex.  We therefore performed a kissing balloon inflation with  the 2.5 x 15 mm Maverick balloon across the stent side struts into the  distal circumflex and the  2.5 x 20 mm Quantum balloon in the stent itself.  The Quantum balloon was inflated to 6 atmospheres and the Maverick balloon  to 4 atmospheres.  Intermittent doses of nitroglycerin were administered.  Final angiographic images were obtained revealing patency of the circumflex  and the obtuse marginal with 0% residual stenosis at the stent site  extending into the obtuse marginal branch and TIMI 3 flow.  There was a 40%  residual stenosis at the bifurcation of the distal left circumflex across  which the stent had been placed with TIMI 3 flow.   At the conclusion of procedure an AngioSeal vascular closure device was  placed in the right femoral artery with good hemostasis.  There were no  complications.   RESULTS:  1. Complex bifurcational angioplasty and stenting.  An 80% stenosis     extending from the mid circumflex into the second obtuse marginal branch     was treated with a drug-eluting stent reducing it to 0% residual with     TIMI 3 flow.  2. PTCA of the distal left circumflex through the stent side struts     utilizing kissing  balloon technique as described reducing an 80% stenosis     at the bifurcation of the distal circumflex to residual 40% with TIMI 3     flow.   PLAN:  Integrilin will be continued for 18 hours.  Plavix will be  administered for six months.  The patient needs aggressive risk factor  modification.                                               Junious Silk, M.D. [facility]    MWP/MEDQ  D:  09/25/2003  T:  09/26/2003  Job:  [id number]   cc:   Thomes Lolling, M.D.  [place]. [place] [address]  Fax: [phone]   Minus Breeding, M.D.   Cath Lab

## 2011-03-11 NOTE — Consult Note (Signed)
[name], [name] NO.:  [id number]   MEDICAL RECORD NO.:  [id number]                   PATIENT TYPE:  INP   LOCATION:  1832                                 FACILITY:  [facility]   PHYSICIAN:  Minus Breeding, M.D.                DATE OF BIRTH:  [birth year]   DATE OF CONSULTATION:  09/25/2003  DATE OF DISCHARGE:                                   CONSULTATION   REFERRING PHYSICIAN:  Thomes Lolling, M.D.   REASON FOR PRESENTATION:  Evaluate patient with chest pain.   HISTORY OF PRESENT ILLNESS:  The patient is a very nice 65 year old African-  American female with no prior cardiac history.  She reports a one week  history of chest discomfort which has been predominantly with activities  such as ambulating or sweeping.  She describes a chest tightness.  It does  not radiate to her neck or to her arms.  It can be quite intense at times,  reaching 9/10 in severity.  It stops very soon after she stops what she is  doing.  It has progressed to resting discomfort.  Apparently yesterday she  had an appointment with her primary care physician and was describing  resting discomfort.  She was transferred to the emergency room.  She is  currently pain-free on IV nitroglycerin.  She denies associated nausea,  vomiting, or diaphoresis.  This is unlike previous gastroesophageal reflux  symptoms.  She was not having these symptoms prior to one week ago.  She  denies any shortness of breath, PND, or orthopnea.  She has had no  palpitations, presyncope, or syncope.   PAST MEDICAL HISTORY:  1. Hypertension for years.  2. Diabetes mellitus x8-9 years.  3. Gastroesophageal reflux disease.  4. Hyperlipidemia for a few years.   PAST SURGICAL HISTORY:  1. Hysterectomy.  2. Corneal transplant.   ALLERGIES:  None.   MEDICATIONS:  1. Prevacid.  2. Lovastatin 40 mg daily.  3. Atenolol.  4. Timoptic eye drops.  5. Glucophage (the patient does not recall the doses).   SOCIAL HISTORY:  She lives in [place] with a friend.  She does have three  children.  She has been smoking one pack per day for about 45 years.  She  does not drink alcohol.   FAMILY HISTORY:  Contributory for her father dying of myocardial infarction  at age 32.  Her mother died of myocardial infarction at age 55.  She had two  sisters both with massive myocardial infarctions at an early age.  Brother  at age 59 had a myocardial infarction and died.   REVIEW OF SYSTEMS:  As stated in the HPI.  Positive for a 21 pound loss of  weight in six months, weakness, leg tightness with walking, reflux.  Otherwise, as stated in HPI and negative for other systems.   PHYSICAL EXAMINATION:  GENERAL:  The patient is in no acute distress.  VITAL SIGNS:  Blood pressure 180/98, heart rate 79 and regular, afebrile.  HEENT:  Eyelids unremarkable.  Pupils not well visualized with patient  status post lens transplant with slight lens opacification.  Appears to have  dilated pupils.  Fundi not visualized.  Oral mucosa unremarkable,  edentulous.  NECK:  No jugular venous distention.  Wave form within normal limits.  Carotid upstroke brisk and symmetric.  Right carotid bruit.  No thyromegaly.  LYMPHATICS:  No cervical, axillary, or inguinal adenopathy.  LUNGS:  Clear to auscultation bilaterally.  BACK:  No costovertebral angle tenderness.  CHEST:  Unremarkable.  HEART:  PMI not displaced or sustained.  S1 and S2 within normal limits.  No  S3, no S4, possible mild aortic valve click, 2/6 apical systolic murmur  radiating out the aortic outflow tract.  No diastolic murmurs.  ABDOMEN:  Flat, positive bowel sounds.  Normal in frequency and pitch.  No  bruits, rebound, guarding, midline pulsatile mass.  No organomegaly.  SKIN:  No rash, nodules.  EXTREMITIES:  2+ pulses throughout, right femoral bruit.  No clubbing,  cyanosis, edema.  NEUROLOGIC:  Oriented to person, place, and time.  Cranial nerves II-XII   grossly intact.  Motor grossly intact throughout.   LABORATORIES:  EKG:  Sinus rhythm, rate 78, axis within normal limits,  intervals within normal limits, probable old anteroseptal myocardial  infarction, anterolateral T-wave inversions consistent with possible  ischemia.   ASSESSMENT/PLAN:  1. The patient's chest discomfort is worrisome for unstable angina.  She has     multiple cardiovascular risk factors.  She is treated with Lovenox.  She     received aspirin.  She will continue on beta blocker.  We have started     intravenous nitroglycerin.  We plan diagnostic cardiac catheterization.     I have discussed the risks/benefits with the patient and I also called     her friend, Parke Simmers, who she lives with.  She will notify the children who     are all at work.  2. Carotid bruit.  This will be followed up with carotid ultrasound either     in the hospital if she needs bypass or as an outpatient.  3. Diabetes mellitus.  Per the teaching service.  4. Stop smoking.  Will get a consult.  5. Risk reduction.  Will check a lipid profile and manage this aggressively.                                               Minus Breeding, M.D.    JH/MEDQ  D:  09/25/2003  T:  09/25/2003  Job:  [id number]

## 2011-03-11 NOTE — Assessment & Plan Note (Signed)
[facility]                               LIPID CLINIC NOTE   NAME:[name], [name]                   MRN:          [id number]  DATE:02/22/2007                            DOB:          1946/03/20    RETURN OFFICE VISIT:   PAST MEDICAL HISTORY:  Hyperlipidemia, diabetes mellitus, coronary  artery disease, status post Taxus stent to the mid-circumflex, with an  ejection fraction of 40%.   MEDICATION ALLERGIES:  None.   CURRENT MEDICATIONS:  1. Aspirin 325 mg daily.  2. Metformin 850 twice daily.  3. Benazepril 40 mg daily.  4. Norvasc 10 mg daily.  5. Plavix 75 mg daily.  6. Iron 325 mg daily.  7. Actonel 35 mg a week.  8. Metoprolol 100 mg twice daily.  9. Zetia 10 mg daily.  10.Lipitor 80 mg daily.   VITAL SIGNS:  Weight 128 pounds, blood pressure 118/60, heart rate 80.   LABORATORY DATA:  Total cholesterol of 120, triglyceride 145, HDL 36,  LDL 55.  Hemoglobin A1c 6.3.  LFT within normal limits.   ASSESSMENT:  [name] is a 66 year old black female, who returns to  lipid clinic today with no chest pain, no shortness of breath, no muscle  aches or pain.  She is compliant with medication stated.  She states  that she had quit tobacco four months ago, although she still smells  like smoke.  I wonder if her room-mate smokes.  She feels great and  states that she has increased her walking to 30 minutes three times a  week.  She states that she has decreased on her fried foods.  She  occasionally eats it maybe once or twice a month.  Her room-mate is  diabetic, so they try to both eat healthy.  They bake and broil their  food.  She eats oatmeal or cereal for breakfast and occasionally eats  eggs.  Total cholesterol is a goal of less than 200, triglycerides a  goal of less than 150, HDL although less than goal of greater than 40,  is slowly increasing, and LDL remains less than goal of less than 70.   PLAN:  1. Continue current  medication regimen.  2. Continue heart healthy diet.  3. Continue exercise.  4. Followup visit in six months for lipid panel and LFTs and we will      make adjustments as needed.      Bonnita Nasuti, PharmD  Electronically Signed      Marijo Conception. Verl Blalock, MD, [facility]  Electronically Signed   LC/MedQ  DD: 02/22/2007  DT: 02/22/2007  Job #: [id number]

## 2011-03-11 NOTE — Cardiovascular Report (Signed)
NAMESAQUITA, CZYZ NO.:  [id number]   MEDICAL RECORD NO.:  [id number]                   PATIENT TYPE:  INP   LOCATION:  6526                                 FACILITY:  [facility]   PHYSICIAN:  Satira Sark, M.D. [facility]        DATE OF BIRTH:  1946-09-20   DATE OF PROCEDURE:  DATE OF DISCHARGE:                              CARDIAC CATHETERIZATION   PRIMARY CARE PHYSICIAN:  Katherina Mires, M.D.   Velora Heckler CARDIOLOGIST:  Minus Breeding, M.D.   INDICATIONS:  Ms. Ringor is a 65 year old woman with type 2 diabetes  mellitus, hypertension, and dyslipidemia who presents with a week long  history of exertional chest discomfort culminating in chest discomfort at  rest.  Electrocardiogram shows nonspecific changes and initial cardiac  markers are negative.  She is referred for definitive evaluation of her  coronary anatomy given concerns about progressive unstable angina.   PROCEDURES PERFORMED:  1. Left heart catheterization.  2. Selective coronary angiography.  3. Left ventriculography.   ACCESS AND EQUIPMENT:  The area about the right femoral artery was  anesthetized with 1% lidocaine and a 6-French sheath was placed in the right  femoral artery via the modified Seldinger technique.  Standard preformed 6-  [place] and JR4 catheters were used for selective coronary angiography  and an angled pigtail catheter was used for left heart catheterization and  left ventriculography.  All exchanges were made over a wire and the patient  tolerated the procedure well without immediate complications.   HEMODYNAMICS:  1. Left ventricle 174/13 mmHg.  2. Aorta 174/83 mmHg.   ANGIOGRAPHIC FINDINGS:  1. The left main coronary artery is free of significant flow limiting     coronary atherosclerosis.  2. The left anterior descending is a medium caliber vessel with three small     diagonal branches.  There is a 60% diffuse proximal stenosis noted.  3. The circumflex  coronary artery is a medium caliber vessel with three     obtuse marginal branches.  The largest is a third bifurcating branch     distally.  In this region there is an 85% stenosis in the distal     circumflex that also involves the ostia of the third obtuse marginal     branch.  4. The right coronary artery is a relatively small vessel with a posterior     descending branch.  There are two large RV marginal branches with     takeoffs in the proximal vessel.  In this region there is a 60-70%     stenosis in the RCA proper with a 60% stenosis in the first RV marginal     branch.   LEFT VENTRICULOGRAPHY:  Left ventriculography is performed in the RAO  projection.  Revealed an ejection fraction of approximately 40-45% in the  setting of significant ventricular ectopy.  2+ mitral regurgitation is also  noted.   DIAGNOSES:  1. Multivessel coronary artery disease as  outlined.  The culprit lesion is     likely the 85% circumflex stenosis.  2. Left ventricular ejection fraction in the 40-45% range with 2+ mitral     regurgitation in the setting of significant ventricular ectopy.   RECOMMENDATIONS:  I reviewed the films with Minus Breeding, M.D. and Junious Silk, M.D. [facility].  Plan at this time will be percutaneous intervention to  address the circumflex stenosis with subsequent aggressive medical therapy.                                               Satira Sark, M.D. [facility]    SGM/MEDQ  D:  09/25/2003  T:  09/26/2003  Job:  [id number]

## 2011-03-11 NOTE — Assessment & Plan Note (Signed)
[facility]                                 LIPID CLINIC NOTE   NAME:[name], [name]                   MRN:          [id number]  DATE:09/07/2006                            DOB:          Jul 23, 1946    PAST MEDICAL HISTORY:  1. Hyperlipidemia.  2. Diabetes mellitus.  3. Coronary artery disease, status post Taxus stent to the mid circumflex      and ejection fraction of 40%.   ALLERGIES:  No known drug allergies.   CURRENT MEDICATIONS:  1. Aspirin 325 mg daily.  2. Metformin 850 mg twice daily with meals.  3. Benazepril 40 mg daily.  4. Norvasc 10 mg daily.  5. Plavix 75 mg daily.  6. Iron 325 mg daily.  7. Actonel 35 mg a week.  8. Metoprolol 100 mg twice daily.  9. Zetia 10 mg daily.  10.Lipitor 80 mg daily.   PHYSICAL EXAMINATION:  VITAL SIGNS:  Weight 138 pounds, blood pressure  120/80, heart rate 70.   LABORATORY DATA AND X-RAY FINDINGS:  Total cholesterol 123, triglyceride  120, HDL 34, LDL 65.  LFTs within normal limits.   ASSESSMENT:  [name] is a pleasant woman who returns to lipid clinic  today with no chest pain, no shortness of breath, no muscle aches and pains.  She is compliant with current medication regimen.  She exercises by walking  approximately 1 hour a day.  She walks 5 blocks in her neighborhood twice a  day each time about 30 minutes.  She eats a fairly heart-healthy diet.  She  eats cereal or oatmeal in the morning for breakfast sweetened with some  equal.  She occasionally eats bacon and eggs maybe less than once a week.  She eats a hot dog once a week for lunch.  Her lunch and evening meals  consist of low-fat protein, a small portion of carbohydrates and some  vegetables.  She says that she does not snack on any chips or sweets.  She  does have some vegetables around the house to have whenever she does get  hungry for snacks.  She says she feels good.  She has no complaints.  She is  doing great and I  have commended her on her weight loss and compliance with  her diet and exercise.  Her total cholesterol is a goal of less than 200,  triglycerides a goal of less than 150, LDL with goal less than 70 and HDL,  although less than goal, greater than 40, is continuing to increase now at  34.   PLAN:  1. Continue current medication regimen.  2. Continue diet and exercise.  3. Followup visit in 6 months for lipid panel and LFTs.      Bonnita Nasuti, PharmD  Electronically Signed      Marijo Conception. Verl Blalock, MD, [facility]  Electronically Signed   LC/MedQ  DD: 09/07/2006  DT: 09/07/2006  Job #: [id number]

## 2011-03-11 NOTE — Discharge Summary (Signed)
NAMEYURIKO, ORZECH                        ACCOUNT NO.:  [id number]   MEDICAL RECORD NO.:  [id number]                   PATIENT TYPE:  INP   LOCATION:  6526                                 FACILITY:  [facility]   PHYSICIAN:  Jay Schlichter, M.D.               DATE OF BIRTH:  11-13-1945   DATE OF ADMISSION:  09/24/2003  DATE OF DISCHARGE:  09/27/2003                                 DISCHARGE SUMMARY   DISCHARGE DIAGNOSES:  1. Acute coronary syndrome/coronary artery disease.  2. Diabetes, type 2.  3. Hypertension.  4. Hyperlipidemia.  5. Esophageal reflux.  6. Tobacco use.  7. Iron deficiency anemia.  8. Peripheral vascular disease.   DISCHARGE MEDICATIONS:  1. Plavix 75 mg p.o. daily x6 months.  2. Enteric-coated aspirin 325 mg p.o. daily.  3. Glucophage 850 mg p.o. b.i.d.  4. Zocor 20 mg p.o. nightly.  5. Lotensin 20 mg p.o. daily.  6. Lopressor 100 mg p.o. b.i.d.  7. Iron sulfate 325 mg p.o. b.i.d.  8. Senokot-S 1 tablet p.o. b.i.d.  9. Continue home eye drops.   DIAGNOSTIC PROCEDURES:  Balloon angioplasty and stent placement of a drug-  eluting stent in the left circumflex; this was performed September 25, 2003.   DISPOSITION/FOLLOWUP:  Patient had an appointment to have carotid ultrasound  Dopplers done on October 03, 2003 at 12:30 p.m. and also had an appointment  to see Dr. Jeneen Rinks [facility] October 08, 2003 at 11 a.m.   HISTORY OF PRESENT ILLNESS:  64 African American female  with past medical history of diabetes, hypertension, coronary artery  disease, who presented to the ED secondary shortness of breath.  She states  that the shortness of breath lasted approximately a week, getting worse.  She noticed that she was not able to walk without dyspnea.  This is her  first episode.  She described shortness of breath with exertion and it was  also associated with tightness in her chest.  It is radiating to her back.  This is relieved at rest.  She  was seen on the morning of admission by her  gastroenterologist, who performed an EKG, and was sent to the ED for  abnormal findings.   ALLERGIES:  None.   PAST MEDICAL HISTORY:  1. Diabetes.  2. Hypertension.  3. Hyperlipidemia.  4. Esophageal reflux disease.  5. Microcytic anemia.   MEDICATIONS:  1. Glucophage 850 mg b.i.d.  2. Mevacor 40 mg daily.  3. Timolol eye drops.  4. Atenolol 100 mg daily.  5. Prevacid 30 mg daily.   SUBSTANCE USE:  Current tobacco user, a pack a day for 45 years; denies  alcohol, cocaine or other illicit drugs.   SOCIAL HISTORY:  She is widowed, lives with a friend and has Medicaid for  both health care and prescription financing.   FAMILY HISTORY:  Mother passed in her 77s for a heart attack.  Father passed  at 59 from coronary artery disease (?).   PHYSICAL EXAM:  VITAL SIGNS:  Pulse of 93, blood pressure of 192/84,  temperature 98.6, respirations 22, at 99% on room air.  GENERAL:  No acute distress when seen in the ED by a resident or intern.  HEENT:  Eyes:  Pupils are equal, round and reactive to light.  Extraocular  movements are intact.  HEENT is unremarkable except she is edentulous.  NECK:  Neck is supple.  Notable carotid bruit on the right.  LUNGS:  Respirations are clear to auscultation with good air movement.  CARDIOVASCULAR:  She has got a regular rate and rhythm, however, a 3/6  systolic murmur heard at the apex.  No rubs or gallops.  GI:  Bowel sounds are present.  No bruits.  Mildly distended, mild  tenderness, otherwise, unremarkable.  EXTREMITIES:  No clubbing, cyanosis, or edema.  SKIN:  Skin is dry with decreased turgor.  NEUROLOGIC:  Neurologic intact, no sensation deficits.   ADMISSION LABORATORIES:  Sodium 140, potassium 3.6, chloride 106, bicarb of  24, BUN of 15, creatinine 1.2, glucose of 124; bilirubin 0.5, alkaline  phosphatase 724, AST of 78, ALT of 76, protein of 7.5, albumin 3.9; calcium  9.3.  White blood count  10.8, hemoglobin 10.1, hematocrit 31.9, platelets of  436,000, MCV of 70.  Cardiac point-of-care markers were negative x3.   HOSPITAL COURSE:  PROBLEM #1 - Patient admitted for likely acute coronary  syndrome.  Patient was placed on aspirin, started on Lovenox, given beta  blocker, ACE inhibitor and admitted to stepdown for telemetry monitoring.  Cardiology was consulted for further workup.  The patient was taken to cath  lab where findings consisted of distal 85% circumflex involvement as well as  near 60% proximal LAD, also had a 60% RCA.  EF showed 40% to 45%.  The  patient then proceeded to have intervention with stent placement and placed  on Integrilin and Plavix.  Aggressive medical care, post procedure, included  increasing her beta blocker as well as increasing her statin.  The patient's  blood pressure, heart rate and chest pain were all stable on discharge.  Follow up as above.   PROBLEM #2 - HYPERTENSION:  Continued medications and titrated as indicated  above.   PROBLEM #3 - DIABETES:  Metformin was held on admission, covered with  sliding scale but returned back onto her home medications at discharge.   PROBLEM #4 - HYPERLIPIDEMIA:  Medications titrated, as above.   PROBLEM #5 - MICROCYTIC ANEMIA:  TSH and iron panel were obtained.  TSH was  2.077, which was normal.  Iron panel results returned back 18 for iron, TIBC  of 445 and percent saturation of 4, ferritin of 11, placed on iron sulfate.  She was heme-negative on rectal exam, however, hemoglobin remained stable  and felt this could be worked up as an outpatient.  She did receive  transfusion during her hospitalization, given her underlying coronary artery  disease, and discharge hemoglobin was 13.   PROBLEM #6 - TOBACCO USE:  Counseled on cessation and risk for continued  use.   PROBLEM #7 - PERIPHERAL VASCULAR DISEASE WITH CAROTID BRUIT:  Carotid Dopplers to be done outpatient as she will be followed up in  [facility].      Ron Parker, M.D.                   Jay Schlichter, M.D.    JD/MEDQ  D:  12/10/2003  T:  12/11/2003  Job:  [id number]   cc:   Minus Breeding, M.D.   Thomes Lolling, M.D.  [place] [place]  [place] [address]  Fax: [phone]

## 2011-03-11 NOTE — Assessment & Plan Note (Signed)
[facility] OFFICE NOTE   NAME:[name], [name]                   MRN:          [id number]  DATE:02/05/2007                            DOB:          Nov 08, 1945    PRIMARY:  Dr. Cathlean [name].   REASON FOR PRESENTATION:  Evaluate patient with coronary disease and  lower extremity pain.   HISTORY OF PRESENT ILLNESS:  The patient is a pleasant 65 year old  African American female with history of coronary disease as described  below.  Since I last saw her she has done relatively well.  She says she  has had to take about 6 nitroglycerins in the past 12 months.  She will  occasionally get some shortness of breath or chest discomfort with  exertions.  However, this is sporadic and not reproducible.  It seems to  be a stable pattern.  She has had no resting pain.  She denies any  resting shortness of breath and has had no PND or orthopnea.  She is not  noticing any palpitation, presyncope, or syncope.  She does get some leg  cramping at night.  Also, she gets some reproducible leg discomfort with  walking.  This is a tightness or soreness in the back of her calves.  This somewhat limits her.  She is trying to do some more walking but has  actually not been doing as much as I would like.   PAST MEDICAL HISTORY:  1. Coronary artery disease (December 2004, 60% proximal LAD stenosis,      circumflex 85% stenosis extending into an acute marginal, 60% - 70%      right coronary artery stenosis, 60% RV branch stenosis.  The      patient had TAXUS stenting of the mid circumflex, she had PTCA in      the distal left circumflex, and a bifurcation with a second obtuse      marginal with a kissing balloon technique.).  2. Mildly reduced ejection fraction (initially 40% - 50%, improved      slightly greater than 50%).  3. Mild carotid stenosis.  4. Dyslipidemia.  5. Glaucoma.   ALLERGIES:  NONE.   MEDICATIONS:  1. Aspirin  325 mg daily.  2. Metformin 850 mg b.i.d.  3. Benazepril 40 mg daily.  4. Norvasc 10 mg daily.  5. Plavix 75 mg daily.  6. Iron.  7. Actonel 35 mg a week.  8. Metoprolol 100 mg b.i.d.  9. Vytorin 10/40 daily.   REVIEW OF SYSTEMS:  As stated in the HPI and otherwise negative for  other systems.   PHYSICAL EXAMINATION:  The patient is in no distress.  Blood pressure  140/96, heart rate 70 and regular, weight 128 pounds, body mass index  24.  HEENT:  Eyelids unremarkable, pupils equally round and reactive to  light, fundi not visualized, oral mucosa unremarkable.  NECK:  No jugular venous distention at 45 degrees, carotid upstroke  brisk and symmetrical, no bruits, no thyromegaly.  LYMPHATICS:  No cervical, axillary, inguinal adenopathy.  LUNGS:  Clear to auscultation bilaterally.  BACK:  No costovertebral  angle tenderness.  CHEST:  Unremarkable.  HEART:  PMI not displaced or sustained, S1 and S2 within normal limits,  no S3, no S4, no clicks, no rubs, no murmurs.  ABDOMEN:  Flat, positive bowel sounds normal in frequency and pitch, no  bruits, no rebound, no guarding, no midline pulsatile mass, no  organomegaly.  SKIN:  No rashes, no nodules.  EXTREMITIES:  With 2+ upper pulses, 2+ femorals without bruits, 2+  politeals, absent dorsalis pedis and post tibials, no cyanosis,  clubbing, no edema.   EKG sinus rhythm, rate 67, axis within normal limits, intervals within  normal limits, poor anterior R wave progression.   ASSESSMENT/PLAN:  1. Coronary disease.  The patient is not having any new symptoms.  No      further cardiovascular testing is suggested.  Will continue      aggressive risk reduction.  2. Claudication.  The patient does have symptoms consistent with      claudication.  I do not feel the lower extremity pulses.      Therefore, I will order ankle-brachial indices.  3. Carotid stenosis.  She had carotids today and we will get these      results and follow up  accordingly.  4. Hypertension.  Blood pressure is slightly elevated.  She is going      to keep a blood pressure diary and share this with Dr. Jenny [name].  5. Dyslipidemia.  She has had followup in the [facility] and now has      this followed by Dr. Jenny [name].  6. Followup.  I will see her back in 1 year unless she is having any      acute problems.     Ashley Breeding, MD, [facility]  Electronically Signed    JH/MedQ  DD: 02/05/2007  DT: 02/05/2007  Job #: [id number]   cc:   Ashley Borg, MD

## 2011-04-05 ENCOUNTER — Other Ambulatory Visit: Payer: Self-pay

## 2011-04-05 ENCOUNTER — Other Ambulatory Visit: Payer: Self-pay | Admitting: Internal Medicine

## 2011-04-07 ENCOUNTER — Other Ambulatory Visit (INDEPENDENT_AMBULATORY_CARE_PROVIDER_SITE_OTHER): Payer: Medicare PPO

## 2011-04-07 ENCOUNTER — Other Ambulatory Visit: Payer: Self-pay | Admitting: Internal Medicine

## 2011-04-07 LAB — BASIC METABOLIC PANEL
Calcium: 9.6 mg/dL (ref 8.4–10.5)
Creatinine, Ser: 2 mg/dL — ABNORMAL HIGH (ref 0.4–1.2)
GFR: 32.73 mL/min — ABNORMAL LOW (ref >60.00)
Sodium: 144 mEq/L (ref 135–145)

## 2011-04-07 LAB — LIPID PANEL
HDL: 42.5 mg/dL (ref >39.00)
Triglycerides: 229 mg/dL — ABNORMAL HIGH (ref 0.0–149.0)

## 2011-04-07 LAB — LDL CHOLESTEROL, DIRECT: Direct LDL: 87.5 mg/dL

## 2011-04-12 ENCOUNTER — Ambulatory Visit (INDEPENDENT_AMBULATORY_CARE_PROVIDER_SITE_OTHER): Payer: Medicare PPO | Admitting: Internal Medicine

## 2011-04-12 ENCOUNTER — Encounter: Payer: Self-pay | Admitting: Internal Medicine

## 2011-04-12 VITALS — BP 130/72 | HR 85 | Temp 97.8°F | Ht 61.0 in | Wt 153.5 lb

## 2011-04-12 DIAGNOSIS — N189 Chronic kidney disease, unspecified: Secondary | ICD-10-CM

## 2011-04-12 DIAGNOSIS — I10 Essential (primary) hypertension: Secondary | ICD-10-CM

## 2011-04-12 DIAGNOSIS — Z0000 Encounter for general adult medical examination without abnormal findings: Secondary | ICD-10-CM | POA: Insufficient documentation

## 2011-04-12 DIAGNOSIS — E119 Type 2 diabetes mellitus without complications: Secondary | ICD-10-CM

## 2011-04-12 DIAGNOSIS — E785 Hyperlipidemia, unspecified: Secondary | ICD-10-CM

## 2011-04-12 MED ORDER — EZETIMIBE 10 MG PO TABS: 10.0000 mg | ORAL_TABLET | Freq: Every day | ORAL | Status: DC

## 2011-04-12 MED ORDER — GLIMEPIRIDE 2 MG PO TABS: 4.0000 mg | ORAL_TABLET | Freq: Two times a day (BID) | ORAL | Status: DC

## 2011-04-12 NOTE — Assessment & Plan Note (Signed)
Mild grad worsening over the past yr, to cont current meds, but check renal u/s, and refer nephrology

## 2011-04-12 NOTE — Assessment & Plan Note (Signed)
Needs to d/c metformin due to renal fxn, increase the amaryl, and f/u with labs 6 wks

## 2011-04-12 NOTE — Assessment & Plan Note (Signed)
stable overall by hx and exam, most recent data reviewed with pt, and pt to continue medical treatment as before  BP Readings from Last 3 Encounters:  04/12/11 130/72  02/16/11 140/62  10/07/10 144/90

## 2011-04-12 NOTE — Assessment & Plan Note (Signed)
stable overall by hx and exam, most recent data reviewed with pt, and pt to continue medical treatment as before  Lab Results  Component Value Date   LDLCALC 65 10/07/2010

## 2011-04-12 NOTE — Patient Instructions (Addendum)
Please stop the metformin due to the kidney function worsening Increase the glimeparide to 4 mg twice per day , for diabetes The zetia was refilled today You will be contacted regarding the referral for: ultrasound for the kidneys, as well as nephrology referral Please return in 6 weeks with Lab testing done 3-5 days before

## 2011-04-12 NOTE — Progress Notes (Signed)
Subjective:    Patient ID: [name], female    DOB: 09/11/1946, 65 y.o.   MRN: [id number]  HPI  Here to f/u; overall doing ok,  Pt denies chest pain, increased sob or doe, wheezing, orthopnea, PND, increased LE swelling, palpitations, dizziness or syncope.  Pt denies new neurological symptoms such as new headache, or facial or extremity weakness or numbness   Pt denies polydipsia, polyuria, or low sugar symptoms such as weakness or confusion improved with po intake.  Pt states overall good compliance with meds, trying to follow lower cholesterol, diabetic diet, wt overall stable but little exercise however.  Did see orhtopedic feb and apr 2012 - now s/p cortisone and PT, left shoulder now better, but still cant abduct fully , but at least can use it better. CBG's in the currently [address]. Past Medical History  Diagnosis Date  . Glaucoma   . Mitral regurgitation   . CAD (coronary artery disease)   . DM2 (diabetes mellitus, type 2)   . GERD (gastroesophageal reflux disease)   . HLD (hyperlipidemia)   . HTN (hypertension)   . PUD (peptic ulcer disease)   . Anemia   . Carotid stenosis   . Osteoporosis   . PVD (peripheral vascular disease)   . Anemia, iron deficiency    Past Surgical History  Procedure Date  . Appendectomy   . Hysterectomy - unknown type   . Oophorectomy     reports that she quit smoking about 3 years ago. She does not have any smokeless tobacco history on file. She reports that she does not drink alcohol. Her drug history not on file. family history includes Heart disease in an unspecified family member; Muscular dystrophy in an unspecified family member; and Stroke in an unspecified family member. No Known Allergies Current Outpatient Prescriptions on File Prior to Visit  Medication Sig Dispense Refill  . ACTONEL 150 MG tablet TAKE 1 TABLET BY MOUTH ONCE A MONTH-TAKEON THE SAME DAY OF EACH MONTH.  1 tablet  11  . amLODipine (NORVASC) 10 MG tablet Take 10 mg by  mouth daily.        Marland Kitchen aspirin 325 MG tablet Take 325 mg by mouth daily.        Marland Kitchen atorvastatin (LIPITOR) 80 MG tablet TAKE ONE (1) TABLET EACH DAY  30 tablet  6  . benazepril (LOTENSIN) 40 MG tablet Take 40 mg by mouth daily.        . clopidogrel (PLAVIX) 75 MG tablet Take 75 mg by mouth daily.        . cyanocobalamin (,VITAMIN B-12,) 1000 MCG/ML injection Inject 1,000 mcg into the muscle every 30 (thirty) days.        Marland Kitchen ezetimibe (ZETIA) 10 MG tablet Take 10 mg by mouth daily.        . ferrous sulfate 325 (65 FE) MG tablet Take 325 mg by mouth 2 (two) times daily.        Marland Kitchen glimepiride (AMARYL) 2 MG tablet Take 3 mg by mouth daily before breakfast.        . hydrochlorothiazide (,MICROZIDE/HYDRODIURIL,) 12.5 MG capsule Take 12.5 mg by mouth daily.        . metFORMIN (GLUCOPHAGE) 1000 MG tablet Take 1,000 mg by mouth 2 (two) times daily with a meal.        . metoprolol (LOPRESSOR) 100 MG tablet Take 100 mg by mouth 2 (two) times daily.        . nitroGLYCERIN (NITROSTAT)  0.4 MG SL tablet Place 0.4 mg under the tongue every 5 (five) minutes as needed.        Marland Kitchen omeprazole (PRILOSEC) 20 MG capsule Take 20 mg by mouth daily.         Review of Systems Review of Systems  Constitutional: Negative for diaphoresis and unexpected weight change.  HENT: Negative for drooling and tinnitus.   Eyes: Negative for photophobia and visual disturbance.  Respiratory: Negative for choking and stridor.   Gastrointestinal: Negative for vomiting and blood in stool.  Genitourinary: Negative for hematuria and decreased urine volume.     Objective:   Physical Exam BP 130/72  Pulse 85  Temp(Src) 97.8 F (36.6 C) (Oral)  Ht 5\' 1"  (1.549 m)  Wt 153 lb 8 oz (69.627 kg)  BMI 29.00 kg/m2  SpO2 96% Physical Exam  VS noted Constitutional: Pt appears well-developed and well-nourished.  HENT: Head: Normocephalic.  Right Ear: External ear normal.  Left Ear: External ear normal.  Eyes: Conjunctivae and EOM are normal.  Pupils are equal, round, and reactive to light.  Neck: Normal range of motion. Neck supple.  Cardiovascular: Normal rate and regular rhythm.   Pulmonary/Chest: Effort normal and breath sounds normal.  Abd:  Soft, NT, non-distended, + BS Neurological: Pt is alert. No cranial nerve deficit.  Skin: Skin is warm. No erythema. no edema Psychiatric: Pt behavior is normal. Thought content normal.         Assessment & Plan:

## 2011-04-14 ENCOUNTER — Other Ambulatory Visit: Payer: Self-pay

## 2011-04-21 ENCOUNTER — Other Ambulatory Visit: Payer: Self-pay | Admitting: Cardiology

## 2011-04-21 DIAGNOSIS — N189 Chronic kidney disease, unspecified: Secondary | ICD-10-CM

## 2011-04-22 ENCOUNTER — Ambulatory Visit (INDEPENDENT_AMBULATORY_CARE_PROVIDER_SITE_OTHER): Payer: Medicare PPO | Admitting: Cardiology

## 2011-04-22 DIAGNOSIS — I739 Peripheral vascular disease, unspecified: Secondary | ICD-10-CM

## 2011-04-22 DIAGNOSIS — N189 Chronic kidney disease, unspecified: Secondary | ICD-10-CM

## 2011-04-25 ENCOUNTER — Other Ambulatory Visit: Payer: Self-pay | Admitting: Internal Medicine

## 2011-04-25 ENCOUNTER — Encounter: Payer: Self-pay | Admitting: Internal Medicine

## 2011-04-25 DIAGNOSIS — I701 Atherosclerosis of renal artery: Secondary | ICD-10-CM

## 2011-04-26 ENCOUNTER — Telehealth: Payer: Self-pay | Admitting: *Deleted

## 2011-04-26 ENCOUNTER — Other Ambulatory Visit: Payer: Self-pay | Admitting: Internal Medicine

## 2011-04-26 NOTE — Telephone Encounter (Signed)
Message copied by Rockwell Germany on Tue Apr 26, 2011  5:00 PM ------      Message from: [name]      Created: Mon Apr 25, 2011  5:58 PM       Left message on phone tree  - ? 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

## 2011-04-26 NOTE — Telephone Encounter (Signed)
Left msge to inform w/home contact to relay.

## 2011-05-02 ENCOUNTER — Other Ambulatory Visit: Payer: Self-pay | Admitting: Internal Medicine

## 2011-05-11 ENCOUNTER — Ambulatory Visit (INDEPENDENT_AMBULATORY_CARE_PROVIDER_SITE_OTHER): Payer: Medicare PPO | Admitting: Internal Medicine

## 2011-05-11 ENCOUNTER — Encounter: Payer: Self-pay | Admitting: Internal Medicine

## 2011-05-11 VITALS — BP 142/74 | HR 65 | Temp 98.2°F | Ht 61.0 in | Wt 153.5 lb

## 2011-05-11 DIAGNOSIS — I701 Atherosclerosis of renal artery: Secondary | ICD-10-CM

## 2011-05-11 DIAGNOSIS — Z0000 Encounter for general adult medical examination without abnormal findings: Secondary | ICD-10-CM

## 2011-05-11 DIAGNOSIS — E119 Type 2 diabetes mellitus without complications: Secondary | ICD-10-CM

## 2011-05-11 DIAGNOSIS — Z23 Encounter for immunization: Secondary | ICD-10-CM

## 2011-05-11 DIAGNOSIS — M25519 Pain in unspecified shoulder: Secondary | ICD-10-CM

## 2011-05-11 MED ORDER — PNEUMOCOCCAL VAC POLYVALENT 25 MCG/0.5ML IJ INJ
0.5000 mL | INJECTION | Freq: Once | INTRAMUSCULAR | Status: AC
  Administered 2011-05-11: 0.5 mL via INTRAMUSCULAR

## 2011-05-11 NOTE — Assessment & Plan Note (Signed)
BP adequate today, cont meds as is, has appt for f/u with Dr Percival Spanish late Aug to address

## 2011-05-11 NOTE — Assessment & Plan Note (Signed)
Resolved  S/p tx for left rot cuff tendonitis per Dr Susanne Borders - ortho.

## 2011-05-11 NOTE — Assessment & Plan Note (Signed)
To cont to monitor cbg's closely, for better diet, o/w stable overall by hx and exam, most recent data reviewed with pt, and pt to continue medical treatment as before  Lab Results  Component Value Date   HGBA1C 6.3 04/07/2011

## 2011-05-11 NOTE — Assessment & Plan Note (Signed)
Overall doing well, age appropriate education and counseling updated, referrals for preventative services and immunizations addressed, dietary and smoking counseling addressed, most recent labs and ECG reviewed.  I have personally reviewed and have noted: 1) the patient's medical and social history 2) The pt's use of alcohol, tobacco, and illicit drugs 3) The patient's current medications and supplements 4) Functional ability including ADL's, fall risk, home safety risk, hearing and visual impairment 5) Diet and physical activities 6) Evidence for depression or mood disorder 7) The patient's height, weight, and BMI have been recorded in the chart I have made referrals, and provided counseling and education based on review of the above Pt declines further labs today as had some labs in June already

## 2011-05-11 NOTE — Progress Notes (Signed)
Subjective:    Patient ID: [name], female    DOB: 1946/03/24, 65 y.o.   MRN: [id number]  HPI  Here for wellness and f/u;  Overall doing ok;  Pt denies CP, worsening SOB, DOE, wheezing, orthopnea, PND, worsening LE edema, palpitations, dizziness or syncope.  Pt denies neurological change such as new Headache, facial or extremity weakness.  Pt denies polydipsia, polyuria, or low sugar symptoms. Pt states overall good compliance with treatment and medications, good tolerability, and trying to follow lower cholesterol diet.  Pt denies worsening depressive symptoms, suicidal ideation or panic. No fever, wt loss, night sweats, loss of appetite, or other constitutional symptoms.  Pt states good ability with ADL's, low fall risk, home safety reviewed and adequate, no significant changes in hearing or vision (though already legally blind), and not really active with exercise. Although recent a1c was normal, pt states recent sugars for some reason have been elevated, with cbg 254 this am - ? dietary indiscretion.   Pt denies fever, wt loss, night sweats, loss of appetite, or other constitutional symptoms Past Medical History  Diagnosis Date  . Glaucoma   . Mitral regurgitation   . CAD (coronary artery disease)   . DM2 (diabetes mellitus, type 2)   . GERD (gastroesophageal reflux disease)   . HLD (hyperlipidemia)   . HTN (hypertension)   . PUD (peptic ulcer disease)   . Anemia   . Carotid stenosis   . Osteoporosis   . PVD (peripheral vascular disease)   . Anemia, iron deficiency    Past Surgical History  Procedure Date  . Appendectomy   . Hysterectomy - unknown type   . Oophorectomy     reports that she quit smoking about 3 years ago. She does not have any smokeless tobacco history on file. She reports that she does not drink alcohol. Her drug history not on file. family history includes Heart disease in an unspecified family member; Muscular dystrophy in an unspecified family member; and  Stroke in an unspecified family member. No Known Allergies Current Outpatient Prescriptions on File Prior to Visit  Medication Sig Dispense Refill  . ACTONEL 150 MG tablet TAKE 1 TABLET BY MOUTH ONCE A MONTH-TAKEON THE SAME DAY OF EACH MONTH.  1 tablet  11  . amLODipine (NORVASC) 10 MG tablet Take 10 mg by mouth daily.        Marland Kitchen aspirin 325 MG tablet Take 325 mg by mouth daily.        Marland Kitchen atorvastatin (LIPITOR) 80 MG tablet TAKE ONE (1) TABLET EACH DAY  30 tablet  6  . benazepril (LOTENSIN) 40 MG tablet TAKE ONE (1) TABLET EACH DAY  90 tablet  3  . clopidogrel (PLAVIX) 75 MG tablet Take 75 mg by mouth daily.        Marland Kitchen ezetimibe (ZETIA) 10 MG tablet Take 1 tablet (10 mg total) by mouth daily.  90 tablet  3  . ferrous sulfate 325 (65 FE) MG tablet Take 325 mg by mouth 2 (two) times daily.        Marland Kitchen glimepiride (AMARYL) 2 MG tablet Take 2 tablets (4 mg total) by mouth 2 (two) times daily.  180 tablet  3  . hydrochlorothiazide (,MICROZIDE/HYDRODIURIL,) 12.5 MG capsule TAKE ONE (1) CAPSULE EACH DAY  90 capsule  1  . metoprolol (LOPRESSOR) 100 MG tablet Take 100 mg by mouth 2 (two) times daily.        . nitroGLYCERIN (NITROSTAT) 0.4 MG SL tablet Place  0.4 mg under the tongue every 5 (five) minutes as needed.        Marland Kitchen omeprazole (PRILOSEC) 20 MG capsule Take 20 mg by mouth daily.         No current facility-administered medications on file prior to visit.   Review of Systems Review of Systems  Constitutional: Negative for diaphoresis, activity change, appetite change and unexpected weight change.  HENT: Negative for hearing loss, ear pain, facial swelling, mouth sores and neck stiffness.   Eyes: Negative for pain, redness and visual disturbance.  Respiratory: Negative for shortness of breath and wheezing.   Cardiovascular: Negative for chest pain and palpitations.  Gastrointestinal: Negative for diarrhea, blood in stool, abdominal distention and rectal pain.  Genitourinary: Negative for hematuria,  flank pain and decreased urine volume.  Musculoskeletal: Negative for myalgias and joint swelling.  Skin: Negative for color change and wound.  Neurological: Negative for syncope and numbness.  Hematological: Negative for adenopathy.  Psychiatric/Behavioral: Negative for hallucinations, self-injury, decreased concentration and agitation.      Objective:   Physical Exam BP 142/74  Pulse 65  Temp(Src) 98.2 F (36.8 C) (Oral)  Ht 5\' 1"  (1.549 m)  Wt 153 lb 8 oz (69.627 kg)  BMI 29.00 kg/m2  SpO2 96% Physical Exam  VS noted Constitutional: Pt is oriented to person, place, and time. Appears well-developed and well-nourished.  HENT:  Head: Normocephalic and atraumatic.  Right Ear: External ear normal.  Left Ear: External ear normal.  Nose: Nose normal.  Mouth/Throat: Oropharynx is clear and moist.  Eyes: Conjunctivae and EOM are normal.  Neck: Normal range of motion. Neck supple. No JVD present. No tracheal deviation present.  Cardiovascular: Normal rate, regular rhythm, normal heart sounds and intact distal pulses.   Pulmonary/Chest: Effort normal and breath sounds normal.  Abdominal: Soft. Bowel sounds are normal. There is no tenderness.  Musculoskeletal: Normal range of motion. Exhibits no edema.  Lymphadenopathy:  Has no cervical adenopathy.  Neurological: Pt is alert and oriented to person, place, and time. Pt has normal reflexes. No cranial nerve deficit.  Skin: Skin is warm and dry. No rash noted.  Psychiatric:  Has  normal mood and affect. Behavior is normal.         Assessment & Plan:

## 2011-05-11 NOTE — Patient Instructions (Signed)
You had the pneumonia shot today You are otherwise up to date with prevention today Continue all other medications as before Please keep your appointments with your specialists as you have planned - Dr Percival Spanish Please return in 6 mo with Lab testing done 3-5 days before

## 2011-05-18 ENCOUNTER — Other Ambulatory Visit: Payer: Self-pay | Admitting: Internal Medicine

## 2011-05-20 ENCOUNTER — Encounter: Payer: Self-pay | Admitting: Internal Medicine

## 2011-05-20 ENCOUNTER — Ambulatory Visit (INDEPENDENT_AMBULATORY_CARE_PROVIDER_SITE_OTHER): Payer: Medicare PPO | Admitting: Internal Medicine

## 2011-05-20 ENCOUNTER — Other Ambulatory Visit (INDEPENDENT_AMBULATORY_CARE_PROVIDER_SITE_OTHER): Payer: Medicare PPO

## 2011-05-20 VITALS — BP 132/72 | HR 73 | Temp 98.0°F | Ht 61.0 in | Wt 154.4 lb

## 2011-05-20 DIAGNOSIS — E119 Type 2 diabetes mellitus without complications: Secondary | ICD-10-CM

## 2011-05-20 DIAGNOSIS — E785 Hyperlipidemia, unspecified: Secondary | ICD-10-CM

## 2011-05-20 DIAGNOSIS — I10 Essential (primary) hypertension: Secondary | ICD-10-CM

## 2011-05-20 LAB — URINALYSIS, ROUTINE W REFLEX MICROSCOPIC
Bilirubin Urine: NEGATIVE
Hgb urine dipstick: NEGATIVE
Nitrite: NEGATIVE
Urobilinogen, UA: 0.2 (ref 0.0–1.0)

## 2011-05-20 MED ORDER — ONETOUCH ULTRASOFT LANCETS MISC: Status: AC

## 2011-05-20 MED ORDER — ONETOUCH ULTRASOFT LANCETS MISC: Status: DC

## 2011-05-20 MED ORDER — GLUCOSE BLOOD VI STRP: ORAL_STRIP | Status: AC

## 2011-05-20 MED ORDER — SITAGLIPTIN PHOSPHATE 100 MG PO TABS: ORAL_TABLET | ORAL | Status: DC

## 2011-05-20 NOTE — Patient Instructions (Addendum)
Please use the new glucometer to check your sugars (the strips and lancets were sent to the pharmacy) Please go to LAB for the urine tests to be done today Please use the sample Januvia (HALF of the 100 mg pill) for glucose > 200 Please call if the sugars are still over 200 using HALF the Januvia as we may need to increase to the whole pill Continue all other medications as before Please return in 3 mo with Lab testing done 3-5 days before

## 2011-05-20 NOTE — Assessment & Plan Note (Signed)
stable overall by hx and exam, most recent data reviewed with pt, and pt to continue medical treatment as before  Lab Results  Component Value Date   LDLCALC 65 10/07/2010

## 2011-05-20 NOTE — Assessment & Plan Note (Signed)
Uncontrolled, etiology unclear, will give new glucometer today, to start januvia 100 mg - HALF for glucose > 200, and check UA today as well

## 2011-05-20 NOTE — Progress Notes (Signed)
Subjective:    Patient ID: [name], female    DOB: [birth year], 65 y.o.   MRN: [id number]  HPI  Here to f/u;  For some reason since last seen has had 2-3 wks of increased blood sugars in the 300's despite good med compliance, wt overall stable;  Diet no change per pt, but has been less active lately visiting her friend in the hospital.   Pt denies fever, wt loss, night sweats, loss of appetite, or other constitutional symptoms   Denies urinary symptoms such as dysuria, frequency, urgency,or hematuria.  Pt denies chest pain, increased sob or doe, wheezing, orthopnea, PND, increased LE swelling, palpitations, dizziness or syncope.  Pt denies new neurological symptoms such as new headache, or facial or extremity weakness or numbness   Pt denies polydipsia, polyuria. Has a specific "talking" glucometer which is older and not sure about accuracy Past Medical History  Diagnosis Date  . Glaucoma   . Mitral regurgitation   . CAD (coronary artery disease)   . DM2 (diabetes mellitus, type 2)   . GERD (gastroesophageal reflux disease)   . HLD (hyperlipidemia)   . HTN (hypertension)   . PUD (peptic ulcer disease)   . Anemia   . Carotid stenosis   . Osteoporosis   . PVD (peripheral vascular disease)   . Anemia, iron deficiency    Past Surgical History  Procedure Date  . Appendectomy   . Hysterectomy - unknown type   . Oophorectomy     reports that she quit smoking about 3 years ago. She does not have any smokeless tobacco history on file. She reports that she does not drink alcohol. Her drug history not on file. family history includes Heart disease in an unspecified family member; Muscular dystrophy in an unspecified family member; and Stroke in an unspecified family member. No Known Allergies Current Outpatient Prescriptions on File Prior to Visit  Medication Sig Dispense Refill  . ACTONEL 150 MG tablet TAKE 1 TABLET BY MOUTH ONCE A MONTH-TAKEON THE SAME DAY OF EACH MONTH.  1 tablet  11   . amLODipine (NORVASC) 10 MG tablet TAKE ONE (1) TABLET EACH DAY  30 tablet  11  . aspirin 325 MG tablet Take 325 mg by mouth daily.        Marland Kitchen atorvastatin (LIPITOR) 80 MG tablet TAKE ONE (1) TABLET EACH DAY  30 tablet  6  . benazepril (LOTENSIN) 40 MG tablet TAKE ONE (1) TABLET EACH DAY  90 tablet  3  . clopidogrel (PLAVIX) 75 MG tablet Take 75 mg by mouth daily.        Marland Kitchen ezetimibe (ZETIA) 10 MG tablet Take 1 tablet (10 mg total) by mouth daily.  90 tablet  3  . ferrous sulfate 325 (65 FE) MG tablet Take 325 mg by mouth 2 (two) times daily.        Marland Kitchen glimepiride (AMARYL) 2 MG tablet Take 2 tablets (4 mg total) by mouth 2 (two) times daily.  180 tablet  3  . hydrochlorothiazide (,MICROZIDE/HYDRODIURIL,) 12.5 MG capsule TAKE ONE (1) CAPSULE EACH DAY  90 capsule  1  . metoprolol (LOPRESSOR) 100 MG tablet Take 100 mg by mouth 2 (two) times daily.        . nitroGLYCERIN (NITROSTAT) 0.4 MG SL tablet Place 0.4 mg under the tongue every 5 (five) minutes as needed.        Marland Kitchen omeprazole (PRILOSEC) 20 MG capsule Take 20 mg by mouth daily.  Review of Systems Review of Systems  Constitutional: Negative for diaphoresis and unexpected weight change.  HENT: Negative for drooling and tinnitus.   Eyes: Negative for photophobia and visual disturbance.  Respiratory: Negative for choking and stridor.   Gastrointestinal: Negative for vomiting and blood in stool.  Genitourinary: Negative for hematuria and decreased urine volume.  Objective:   Physical Exam BP 132/72  Pulse 73  Temp(Src) 98 F (36.7 C) (Oral)  Ht 5\' 1"  (1.549 m)  Wt 154 lb 6 oz (70.024 kg)  BMI 29.17 kg/m2  SpO2 95% Physical Exam  VS noted Constitutional: Pt appears well-developed and well-nourished.  HENT: Head: Normocephalic.  Right Ear: External ear normal.  Left Ear: External ear normal.  Eyes: Conjunctivae and EOM are normal. Pupils are equal, round, and reactive to light.  Neck: Normal range of motion. Neck supple.    Cardiovascular: Normal rate and regular rhythm.   Pulmonary/Chest: Effort normal and breath sounds normal.  Abd:  Soft, NT, non-distended, + BS Neurological: Pt is alert. No cranial nerve deficit.  Skin: Skin is warm. No erythema.  Psychiatric: Pt behavior is normal. Thought content normal.         Assessment & Plan:   No problem-specific assessment & plan notes found for this encounter.

## 2011-05-20 NOTE — Assessment & Plan Note (Signed)
stable overall by hx and exam, most recent data reviewed with pt, and pt to continue medical treatment as before  BP Readings from Last 3 Encounters:  05/20/11 132/72  05/11/11 142/74  04/12/11 130/72

## 2011-05-27 ENCOUNTER — Other Ambulatory Visit: Payer: Self-pay

## 2011-05-27 ENCOUNTER — Telehealth: Payer: Self-pay

## 2011-05-27 MED ORDER — CLOPIDOGREL BISULFATE 75 MG PO TABS: 75.0000 mg | ORAL_TABLET | Freq: Every day | ORAL | Status: DC

## 2011-05-27 NOTE — Telephone Encounter (Signed)
Left message on machine for pt to return my call  

## 2011-05-27 NOTE — Telephone Encounter (Signed)
Yes, please cont to monitor sugars over the weekend, and let us know Monday please

## 2011-05-27 NOTE — Telephone Encounter (Signed)
Pt advised.

## 2011-05-27 NOTE — Telephone Encounter (Signed)
Pt called stating her blood sugars are still elevated, [phone]. Pt says she has increased Januvia to whole tablet started yesterday but has not had any improvement in sugars. Should pt give medication more time to work? Please advise.

## 2011-06-03 ENCOUNTER — Telehealth: Payer: Self-pay

## 2011-06-03 MED ORDER — PIOGLITAZONE HCL 15 MG PO TABS: 15.0000 mg | ORAL_TABLET | Freq: Every day | ORAL | Status: DC

## 2011-06-03 NOTE — Telephone Encounter (Signed)
Pt advised and will call back with CBG readings in 1 week.

## 2011-06-03 NOTE — Telephone Encounter (Signed)
Ok to addon actos 15 mg qd - sent to pharmacy per emr  Please cont to monitor CBG's and call with results in 5 days

## 2011-06-03 NOTE — Telephone Encounter (Signed)
Please see previous note. Pt called back stating her CBGS continue to be elevated in the morning, high 200- mid 300's. Pt is requesting MD advisement.

## 2011-06-13 ENCOUNTER — Encounter: Payer: Self-pay | Admitting: Internal Medicine

## 2011-06-13 ENCOUNTER — Ambulatory Visit (INDEPENDENT_AMBULATORY_CARE_PROVIDER_SITE_OTHER): Payer: Medicare PPO | Admitting: Internal Medicine

## 2011-06-13 VITALS — BP 112/70 | HR 68 | Temp 97.9°F | Ht 61.0 in | Wt 158.2 lb

## 2011-06-13 DIAGNOSIS — E785 Hyperlipidemia, unspecified: Secondary | ICD-10-CM

## 2011-06-13 DIAGNOSIS — E119 Type 2 diabetes mellitus without complications: Secondary | ICD-10-CM

## 2011-06-13 DIAGNOSIS — I10 Essential (primary) hypertension: Secondary | ICD-10-CM

## 2011-06-13 MED ORDER — PIOGLITAZONE HCL 30 MG PO TABS: 30.0000 mg | ORAL_TABLET | Freq: Every day | ORAL | Status: DC

## 2011-06-13 NOTE — Assessment & Plan Note (Signed)
stable overall by hx and exam, most recent data reviewed with pt, and pt to continue medical treatment as before  Lab Results  Component Value Date   LDLCALC 65 10/07/2010

## 2011-06-13 NOTE — Progress Notes (Signed)
Subjective:    Patient ID: [name], female    DOB: 09-Mar-1946, 65 y.o.   MRN: [id number]  HPI Here to f/u; overall doing ok,  Pt denies chest pain, increased sob or doe, wheezing, orthopnea, PND, increased LE swelling, palpitations, dizziness or syncope.  Pt denies new neurological symptoms such as new headache, or facial or extremity weakness or numbness   Pt denies polydipsia, polyuria, or low sugar symptoms such as weakness or confusion improved with po intake.  Pt states overall good compliance with meds, trying to follow lower cholesterol, diabetic diet, wt overall stable but little exercise however. Unfortunately despite taking the januvia 100 mg, cbg's are still very often in the 200's.   Pt denies fever, wt loss, night sweats, loss of appetite, or other constitutional symptoms Past Medical History  Diagnosis Date  . Glaucoma   . Mitral regurgitation   . CAD (coronary artery disease)   . DM2 (diabetes mellitus, type 2)   . GERD (gastroesophageal reflux disease)   . HLD (hyperlipidemia)   . HTN (hypertension)   . PUD (peptic ulcer disease)   . Anemia   . Carotid stenosis   . Osteoporosis   . PVD (peripheral vascular disease)   . Anemia, iron deficiency    Past Surgical History  Procedure Date  . Appendectomy   . Hysterectomy - unknown type   . Oophorectomy     reports that she quit smoking about 3 years ago. She does not have any smokeless tobacco history on file. She reports that she does not drink alcohol. Her drug history not on file. family history includes Heart disease in an unspecified family member; Muscular dystrophy in an unspecified family member; and Stroke in an unspecified family member. No Known Allergies Current Outpatient Prescriptions on File Prior to Visit  Medication Sig Dispense Refill  . ACTONEL 150 MG tablet TAKE 1 TABLET BY MOUTH ONCE A MONTH-TAKEON THE SAME DAY OF EACH MONTH.  1 tablet  11  . amLODipine (NORVASC) 10 MG tablet TAKE ONE (1)  TABLET EACH DAY  30 tablet  11  . aspirin 325 MG tablet Take 325 mg by mouth daily.        Marland Kitchen atorvastatin (LIPITOR) 80 MG tablet TAKE ONE (1) TABLET EACH DAY  30 tablet  6  . benazepril (LOTENSIN) 40 MG tablet TAKE ONE (1) TABLET EACH DAY  90 tablet  3  . clopidogrel (PLAVIX) 75 MG tablet Take 1 tablet (75 mg total) by mouth daily.  30 tablet  11  . ezetimibe (ZETIA) 10 MG tablet Take 1 tablet (10 mg total) by mouth daily.  90 tablet  3  . ferrous sulfate 325 (65 FE) MG tablet Take 325 mg by mouth 2 (two) times daily.        Marland Kitchen glimepiride (AMARYL) 2 MG tablet Take 2 tablets (4 mg total) by mouth 2 (two) times daily.  180 tablet  3  . glucose blood (ONE TOUCH ULTRA TEST) test strip PRN  100 each  5  . hydrochlorothiazide (,MICROZIDE/HYDRODIURIL,) 12.5 MG capsule TAKE ONE (1) CAPSULE EACH DAY  90 capsule  1  . Lancets (ONETOUCH ULTRASOFT) lancets PRN  100 each  5  . metoprolol (LOPRESSOR) 100 MG tablet Take 100 mg by mouth 2 (two) times daily.        . nitroGLYCERIN (NITROSTAT) 0.4 MG SL tablet Place 0.4 mg under the tongue every 5 (five) minutes as needed.        Marland Kitchen  omeprazole (PRILOSEC) 20 MG capsule Take 20 mg by mouth daily.        . sitaGLIPtin (JANUVIA) 100 MG tablet 1/2 - 1 tab by mouth per day for sugar > 200  30 tablet  11   Review of Systems Review of Systems  Constitutional: Negative for diaphoresis and unexpected weight change.  HENT: Negative for drooling and tinnitus.   Eyes: Negative for photophobia and visual disturbance.  Respiratory: Negative for choking and stridor.   Gastrointestinal: Negative for vomiting and blood in stool.  Genitourinary: Negative for hematuria and decreased urine volume.       Objective:   Physical Exam BP 112/70  Pulse 68  Temp(Src) 97.9 F (36.6 C) (Oral)  Ht 5\' 1"  (1.549 m)  Wt 158 lb 4 oz (71.782 kg)  BMI 29.90 kg/m2  SpO2 94% Physical Exam  VS noted, not ill appearing Constitutional: Pt appears well-developed and well-nourished.  HENT:  Head: Normocephalic.  Right Ear: External ear normal.  Left Ear: External ear normal.  Eyes: Conjunctivae and EOM are normal. Pupils are equal, round, and reactive to light.  Neck: Normal range of motion. Neck supple.  Cardiovascular: Normal rate and regular rhythm.   Pulmonary/Chest: Effort normal and breath sounds normal.  Abd:  Soft, NT, non-distended, + BS Neurological: Pt is alert. No cranial nerve deficit.  Skin: Skin is warm. No erythema.  Psychiatric: Pt behavior is normal. Thought content normal.         Assessment & Plan:

## 2011-06-13 NOTE — Assessment & Plan Note (Signed)
Pt glucometer seems accurate in comparison to new glucometer, both with persistent elev  BS's mostly in the 200's, occas 300's and rare over 400;  To incresae the actos to 30 mg, but already on 2 full dose other med;  If not controlled, will need insulin next visit (pt states has female roommate that likely could dial up a dose such as with the lantus pen)

## 2011-06-13 NOTE — Assessment & Plan Note (Signed)
stable overall by hx and exam, most recent data reviewed with pt, and pt to continue medical treatment as before  BP Readings from Last 3 Encounters:  06/13/11 112/70  05/20/11 132/72  05/11/11 142/74

## 2011-06-13 NOTE — Patient Instructions (Signed)
Please increase the actos to 30 mg Please continue to check your blood sugars Continue all other medications as before Please keep your next appt;  If your sugars are not well controlled, you may need to start insulin such as lantus

## 2011-06-21 ENCOUNTER — Telehealth: Payer: Self-pay

## 2011-06-21 MED ORDER — PIOGLITAZONE HCL 45 MG PO TABS: 45.0000 mg | ORAL_TABLET | Freq: Every day | ORAL | Status: DC

## 2011-06-21 NOTE — Telephone Encounter (Signed)
Pt advised.

## 2011-06-21 NOTE — Telephone Encounter (Signed)
Ok to increase the actos to 45 mg per day  Done per [employer]

## 2011-06-21 NOTE — Telephone Encounter (Signed)
Pt called to report her blood sugars are still elevated, 196-286, most often around 200.

## 2011-06-24 ENCOUNTER — Encounter: Payer: Self-pay | Admitting: Cardiology

## 2011-06-24 ENCOUNTER — Ambulatory Visit (INDEPENDENT_AMBULATORY_CARE_PROVIDER_SITE_OTHER): Payer: Medicare PPO | Admitting: Cardiology

## 2011-06-24 DIAGNOSIS — E785 Hyperlipidemia, unspecified: Secondary | ICD-10-CM

## 2011-06-24 DIAGNOSIS — I701 Atherosclerosis of renal artery: Secondary | ICD-10-CM

## 2011-06-24 DIAGNOSIS — I10 Essential (primary) hypertension: Secondary | ICD-10-CM

## 2011-06-24 DIAGNOSIS — I080 Rheumatic disorders of both mitral and aortic valves: Secondary | ICD-10-CM

## 2011-06-24 DIAGNOSIS — I2510 Atherosclerotic heart disease of native coronary artery without angina pectoris: Secondary | ICD-10-CM

## 2011-06-24 DIAGNOSIS — I739 Peripheral vascular disease, unspecified: Secondary | ICD-10-CM

## 2011-06-24 NOTE — Assessment & Plan Note (Signed)
I will check ABIs.  She is up to date with carotids and will get these again in March

## 2011-06-24 NOTE — Patient Instructions (Signed)
Your physician has requested that you have an ankle brachial index (ABI). During this test an ultrasound and blood pressure cuff are used to evaluate the arteries that supply the arms and legs with blood. Allow thirty minutes for this exam. There are no restrictions or special instructions.  Your physician has requested that you have a lexiscan myoview. For further information please visit [email or web address]. Please follow instruction sheet, as given.  The current medical regimen is effective;  continue present plan and medications.  You have been schedule to see Dr Burt Knack for renal artery stenosis.  Follow up in 6 months with Dr Percival Spanish.  You will receive a letter in the mail 2 months before you are due.  Please call us when you receive this letter to schedule your follow up appointment.

## 2011-06-24 NOTE — Assessment & Plan Note (Signed)
I reviewed her 2004. She needs followup stress testing as she has continued ongoing risk factors and I will arrange this.

## 2011-06-24 NOTE — Progress Notes (Signed)
HPI The patient presents for follow of CAD.  Since I last saw her she was found to have renal artery stenosis. This is left renal artery but the degree of stenosis is unclear. I did review her labs and her last creatinine was 2.0. She was going to see Dr. Burt Knack but somehow ended up being scheduled in [facility].  She would like to be seen here. She has not been particularly active. She is caring for an elderly friend. With her levels of activities she has no new symptoms however. The patient denies any new symptoms such as chest discomfort, neck or arm discomfort. There has been no new shortness of breath, PND or orthopnea. There have been no reported palpitations, presyncope or syncope.  No Known Allergies  Current Outpatient Prescriptions  Medication Sig Dispense Refill  . ACTONEL 150 MG tablet TAKE 1 TABLET BY MOUTH ONCE A MONTH-TAKEON THE SAME DAY OF EACH MONTH.  1 tablet  11  . amLODipine (NORVASC) 10 MG tablet TAKE ONE (1) TABLET EACH DAY  30 tablet  11  . aspirin 325 MG tablet Take 325 mg by mouth daily.        Marland Kitchen atorvastatin (LIPITOR) 80 MG tablet TAKE ONE (1) TABLET EACH DAY  30 tablet  6  . benazepril (LOTENSIN) 40 MG tablet TAKE ONE (1) TABLET EACH DAY  90 tablet  3  . clopidogrel (PLAVIX) 75 MG tablet Take 1 tablet (75 mg total) by mouth daily.  30 tablet  11  . ezetimibe (ZETIA) 10 MG tablet Take 1 tablet (10 mg total) by mouth daily.  90 tablet  3  . ferrous sulfate 325 (65 FE) MG tablet Take 325 mg by mouth 2 (two) times daily.        Marland Kitchen glimepiride (AMARYL) 2 MG tablet Take 2 tablets (4 mg total) by mouth 2 (two) times daily.  180 tablet  3  . glucose blood (ONE TOUCH ULTRA TEST) test strip PRN  100 each  5  . hydrochlorothiazide (,MICROZIDE/HYDRODIURIL,) 12.5 MG capsule TAKE ONE (1) CAPSULE EACH DAY  90 capsule  1  . Lancets (ONETOUCH ULTRASOFT) lancets PRN  100 each  5  . metoprolol (LOPRESSOR) 100 MG tablet Take 100 mg by mouth 2 (two) times daily.        . nitroGLYCERIN  (NITROSTAT) 0.4 MG SL tablet Place 0.4 mg under the tongue every 5 (five) minutes as needed.        Marland Kitchen omeprazole (PRILOSEC) 20 MG capsule Take 20 mg by mouth daily.        . sitaGLIPtin (JANUVIA) 100 MG tablet 1/2 - 1 tab by mouth per day for sugar > 200  30 tablet  11    Past Medical History  Diagnosis Date  . Glaucoma   . Mitral regurgitation   . CAD (coronary artery disease)   . DM2 (diabetes mellitus, type 2)   . GERD (gastroesophageal reflux disease)   . HLD (hyperlipidemia)   . HTN (hypertension)   . PUD (peptic ulcer disease)   . Anemia   . Carotid stenosis   . Osteoporosis   . PVD (peripheral vascular disease)   . Anemia, iron deficiency     Past Surgical History  Procedure Date  . Appendectomy   . Hysterectomy - unknown type   . Oophorectomy     ROS:  As stated in the HPI and negative for all other systems.  PHYSICAL EXAM BP 148/72  Pulse 68  Ht 5\' 1"  (  1.549 m)  Wt 160 lb 1.9 oz (72.63 kg)  BMI 30.25 kg/m2 GENERAL:  Well appearing HEENT:  Pupils equal round and reactive, fundi not visualized, oral mucosa unremarkable, denetulous NECK:  No jugular venous distention, waveform within normal limits, carotid upstroke brisk and symmetric, no bruits, no thyromegaly LYMPHATICS:  No cervical, inguinal adenopathy LUNGS:  Clear to auscultation bilaterally BACK:  No CVA tenderness CHEST:  Unremarkable HEART:  PMI not displaced or sustained,S1 and S2 within normal limits, no S3, no S4, no clicks, no rubs, no murmurs ABD:  Flat, positive bowel sounds normal in frequency in pitch, no bruits, no rebound, no guarding, no midline pulsatile mass, no hepatomegaly, no splenomegaly EXT:  2 plus pulses upper, absent DP/PT, no edema, no cyanosis no clubbing SKIN:  No rashes no nodules NEURO:  Cranial nerves II through XII grossly intact, motor grossly intact throughout PSYCH:  Cognitively intact, oriented to person place and time   EKG:  Sinus rhythm, rate , axis within normal  limits, intervals within normal limits, no acute ST-T wave changes.   ASSESSMENT AND PLAN

## 2011-06-24 NOTE — Assessment & Plan Note (Signed)
I will arrange for her to see Dr. Burt Knack

## 2011-06-24 NOTE — Assessment & Plan Note (Signed)
Last LDL was 87.5 HDL of 42.5.  She will remain on meds as listed.

## 2011-06-24 NOTE — Progress Notes (Signed)
Addended by: Shellia Cleverly on: 06/24/2011 11:43 AM   Modules accepted: Orders

## 2011-06-24 NOTE — Assessment & Plan Note (Signed)
She had moderate MR on echo in April of last year. I will likely repeat this in the spring of next year.

## 2011-06-24 NOTE — Assessment & Plan Note (Signed)
Her blood pressure is slightly elevated. She will keep a blood pressure diary. For now I will not change the medications.

## 2011-06-28 ENCOUNTER — Telehealth: Payer: Self-pay

## 2011-06-28 NOTE — Telephone Encounter (Signed)
Pt called to report her CBGs for the last week, ranging from 118 - 192.

## 2011-06-28 NOTE — Telephone Encounter (Signed)
Pt informed

## 2011-06-28 NOTE — Telephone Encounter (Signed)
Ok to cont medication as is for now

## 2011-07-05 ENCOUNTER — Encounter: Payer: Self-pay | Admitting: *Deleted

## 2011-07-12 ENCOUNTER — Encounter (INDEPENDENT_AMBULATORY_CARE_PROVIDER_SITE_OTHER): Payer: Medicare PPO | Admitting: *Deleted

## 2011-07-12 ENCOUNTER — Encounter (HOSPITAL_COMMUNITY): Payer: Self-pay | Admitting: Radiology

## 2011-07-12 ENCOUNTER — Ambulatory Visit (HOSPITAL_COMMUNITY): Payer: Medicare PPO | Attending: Cardiology | Admitting: Radiology

## 2011-07-12 VITALS — Ht 61.0 in | Wt 160.0 lb

## 2011-07-12 DIAGNOSIS — I2510 Atherosclerotic heart disease of native coronary artery without angina pectoris: Secondary | ICD-10-CM | POA: Insufficient documentation

## 2011-07-12 DIAGNOSIS — I4949 Other premature depolarization: Secondary | ICD-10-CM

## 2011-07-12 DIAGNOSIS — I739 Peripheral vascular disease, unspecified: Secondary | ICD-10-CM

## 2011-07-12 DIAGNOSIS — R0609 Other forms of dyspnea: Secondary | ICD-10-CM

## 2011-07-12 MED ORDER — REGADENOSON 0.4 MG/5ML IV SOLN
0.4000 mg | Freq: Once | INTRAVENOUS | Status: AC
  Administered 2011-07-12: 0.4 mg via INTRAVENOUS

## 2011-07-12 MED ORDER — TECHNETIUM TC 99M TETROFOSMIN IV KIT
33.0000 | PACK | Freq: Once | INTRAVENOUS | Status: AC | PRN
  Administered 2011-07-12: 33 via INTRAVENOUS

## 2011-07-12 MED ORDER — TECHNETIUM TC 99M TETROFOSMIN IV KIT
10.5000 | PACK | Freq: Once | INTRAVENOUS | Status: AC | PRN
  Administered 2011-07-12: 11 via INTRAVENOUS

## 2011-07-12 NOTE — Progress Notes (Signed)
[place] [address] [phone]  Cardiology Nuclear Med Study  [name] is a 65 y.o. female [id number] April 06, 1946   Nuclear Med Background Indication for Stress Test:  Evaluation for Ischemia and PTCA/Stent Patency History:  '04 PTCA/Stent-LCFX, EF=40-45%; '09 [id number], EF=67%; '11 Echo:EF=55-60% Cardiac Risk Factors: Carotid Disease, Family History - CAD, History of Smoking, Hypertension, Lipids, NIDDM and PVD  Symptoms:  DOE, Fatigue and Fatigue with Exertion   Nuclear Pre-Procedure Caffeine/Decaff Intake:  None NPO After: 11:30pm   Lungs:  Clear.  O2 Sat 99% on RA IV 0.9% NS with Angio Cath:  22g  IV Site: R Hand  IV Started by:  Eliezer Lofts, EMT-P  Chest Size (in):  38 Cup Size: D  Height: 5\' 1"  (1.549 m)  Weight:  160 lb (72.576 kg)  BMI:  Body mass index is 30.23 kg/(m^2). Tech Comments:  Metoprolol held this a.m.    Nuclear Med Study 1 or 2 day study: 1 day  Stress Test Type:  Treadmill/Lexiscan  Reading MD: Mertie Moores, MD  Order Authorizing Provider:  Minus Breeding, MD  Resting Radionuclide: Technetium 66m Tetrofosmin  Resting Radionuclide Dose: 10.5 mCi   Stress Radionuclide:  Technetium 3m Tetrofosmin  Stress Radionuclide Dose: 33.0 mCi           Stress Protocol Rest HR: 70 Stress HR: 112  Rest BP: 122/72 Stress BP: 150/82  Exercise Time (min): 2:00 METS: n/a   Predicted Max HR: 153 bpm % Max HR: 73.2 bpm Rate Pressure Product: 16800   Dose of Adenosine (mg):  n/a Dose of Lexiscan: 0.4 mg  Dose of Atropine (mg): n/a Dose of Dobutamine: n/a mcg/kg/min (at max HR)  Stress Test Technologist: Irven Baltimore, RN  Nuclear Technologist:  Charlton Amor, CNMT     Rest Procedure:  Myocardial perfusion imaging was performed at rest 45 minutes following the intravenous administration of Technetium 45m Tetrofosmin. Rest ECG: NSR with PVC's and T-wave changes.  Stress Procedure:  The  patient received IV Lexiscan 0.4 mg over 15-seconds with concurrent low level exercise and then Technetium 21m Tetrofosmin was injected at 30-seconds while the patient continued walking one more minute.  There were nonspecific ST-T wave changes and occasional PVC's with Lexiscan.  Quantitative spect images were obtained after a 45-minute delay.  Stress ECG: No significant change from baseline ECG  QPS Raw Data Images:  Normal; no motion artifact; normal heart/lung ratio. Stress Images:  There is mild apical thinning with normal uptake in other regions. Rest Images:  There is mild apical thinning with normal uptake in other regions. Subtraction (SDS):  No evidence of ischemia. Transient Ischemic Dilatation (Normal <1.22):  1.04 Lung/Heart Ratio (Normal <0.45):  0.31  Quantitative Gated Spect Images QGS EDV:  64 ml QGS ESV:  18 ml QGS cine images:  NL LV Function; NL Wall Motion QGS EF: 72%  Impression Exercise Capacity:  Lexiscan with low level exercise. BP Response:  Normal blood pressure response. Clinical Symptoms:  No chest pain. ECG Impression:  No significant ST segment change suggestive of ischemia. Comparison with Prior Nuclear Study: No significant change from previous study  Overall Impression:  Normal stress nuclear study.  There is no evidence of ischemia.  Normal LV function.    Thayer Headings, Brooke Bonito., MD, [facility]

## 2011-07-14 ENCOUNTER — Encounter: Payer: Self-pay | Admitting: Cardiovascular Disease

## 2011-07-14 ENCOUNTER — Ambulatory Visit (INDEPENDENT_AMBULATORY_CARE_PROVIDER_SITE_OTHER): Payer: Medicare PPO | Admitting: Cardiovascular Disease

## 2011-07-14 VITALS — BP 127/60 | HR 69 | Ht 61.0 in | Wt 164.0 lb

## 2011-07-14 DIAGNOSIS — I701 Atherosclerosis of renal artery: Secondary | ICD-10-CM

## 2011-07-14 NOTE — Patient Instructions (Signed)
Your physician recommends that you schedule a follow-up appointment in: [place]  Your physician recommends that you continue on your current medications as directed. Please refer to the Current Medication list given to you today.  You have been referred to  [place]  DX RENAL ARTERY STENOSIS AND RENAL IN SUFFIENCY

## 2011-07-15 ENCOUNTER — Encounter: Payer: Self-pay | Admitting: Cardiovascular Disease

## 2011-07-15 NOTE — Assessment & Plan Note (Signed)
The patient's renal arterial duplex is highly suggestive of left renal artery stenosis. A combination of dampened flow with a small kidney is essentially diagnostic. However, it is unclear if the renal artery has high-grade stenosis versus total occlusion. I would guess that the vessel is still patent as the kidney size is not suggestive of complete renal atrophy. In this patient with progressive renal dysfunction, I think a case could be made to manage her aggressively with renal PTA and stenting. We are not able to evaluate her noninvasively as she is not a candidate for CT angiography or MRA angiography due to reduced creatinine clearance. The safest way to evaluate her would probably be with a CO2 angiogram. Before proceeding with this, I think it would be best to have her evaluated by nephrology. The patient has long-standing diabetes and hypertension and is is certainly possible that she has chronic kidney disease related to these problems rather than her renal artery stenosis. I think it would be helpful to have their opinion before proceeding with renal angiography and stenting.  I discussed some of the controversy surrounding renal stenting with the patient and that he clinical course can be unpredictable with some patients continuing to have progressive renal dysfunction, others with stable renal disease, and others show improvement following revascularization. I would like to see her back in followup after her evaluation by nephrology.

## 2011-07-15 NOTE — Progress Notes (Signed)
HPI:  This is a 65 year old woman presenting for evaluation of renal artery stenosis. The patient has a history of mild chronic kidney disease, but creatinine in June of 2012 had increased to 2.0 mg per deciliter and she was referred for a renal arterial duplex for further evaluation. This was performed June 29 and demonstrated asymmetric kidney size with the right kidney 10 cm in length and the left kidney 8 cm in length. The right renal artery was normal by duplex criteria. There was dampened flow throughout the left renal artery, but the ostial and proximal renal artery were not seen. The patient was referred for further evaluation.  Her only symptomatic complaints are chronic exertional dyspnea and bilateral calf claudication. She also has chronic orthopnea. She denies chest pain or pressure. She has no history of stroke or TIA. The patient is widowed and lives with a close friend. She smoked for greater than 50 years but quit tobacco 4 years ago. She has coronary disease and underwent PCI of the left circumflex in 2004 utilizing a drug-eluting stent. She has been maintained on maximal medical program for her coronary and peripheral arterial disease now for many years. The patient also has type 2 diabetes and has been managed with oral hypoglycemics.  Outpatient Encounter Prescriptions as of 07/14/2011  Medication Sig Dispense Refill  . ACTONEL 150 MG tablet TAKE 1 TABLET BY MOUTH ONCE A MONTH-TAKEON THE SAME DAY OF EACH MONTH.  1 tablet  11  . amLODipine (NORVASC) 10 MG tablet TAKE ONE (1) TABLET EACH DAY  30 tablet  11  . aspirin 325 MG tablet Take 325 mg by mouth daily.        Marland Kitchen atorvastatin (LIPITOR) 80 MG tablet TAKE ONE (1) TABLET EACH DAY  30 tablet  6  . benazepril (LOTENSIN) 40 MG tablet TAKE ONE (1) TABLET EACH DAY  90 tablet  3  . clopidogrel (PLAVIX) 75 MG tablet Take 1 tablet (75 mg total) by mouth daily.  30 tablet  11  . ezetimibe (ZETIA) 10 MG tablet Take 1 tablet (10 mg total) by  mouth daily.  90 tablet  3  . ferrous sulfate 325 (65 FE) MG tablet Take 325 mg by mouth 2 (two) times daily.        Marland Kitchen glimepiride (AMARYL) 2 MG tablet Take 2 tablets (4 mg total) by mouth 2 (two) times daily.  180 tablet  3  . glucose blood (ONE TOUCH ULTRA TEST) test strip PRN  100 each  5  . hydrochlorothiazide (,MICROZIDE/HYDRODIURIL,) 12.5 MG capsule TAKE ONE (1) CAPSULE EACH DAY  90 capsule  1  . Lancets (ONETOUCH ULTRASOFT) lancets PRN  100 each  5  . metoprolol (LOPRESSOR) 100 MG tablet Take 100 mg by mouth 2 (two) times daily.        . nitroGLYCERIN (NITROSTAT) 0.4 MG SL tablet Place 0.4 mg under the tongue every 5 (five) minutes as needed.        Marland Kitchen omeprazole (PRILOSEC) 20 MG capsule Take 20 mg by mouth daily.        . sitaGLIPtin (JANUVIA) 100 MG tablet 1/2 - 1 tab by mouth per day for sugar > 200  30 tablet  11    Review of patient's allergies indicates no known allergies.  Past Medical History  Diagnosis Date  . Glaucoma   . Mitral regurgitation   . CAD (coronary artery disease)   . DM2 (diabetes mellitus, type 2)   . GERD (gastroesophageal reflux disease)   .  HLD (hyperlipidemia)   . HTN (hypertension)   . PUD (peptic ulcer disease)   . Anemia   . Carotid stenosis   . Osteoporosis   . PVD (peripheral vascular disease)   . Anemia, iron deficiency     Past Surgical History  Procedure Date  . Appendectomy   . Hysterectomy - unknown type   . Oophorectomy     History   Social History  . Marital Status: Single    Spouse Name: N/A    Number of Children: 3  . Years of Education: N/A   Occupational History  . homemaker    Social History Main Topics  . Smoking status: Former Smoker    Quit date: 10/25/2007  . Smokeless tobacco: Not on file   Comment: Stopped 1.5 yrs ago January 2009  . Alcohol Use: No  . Drug Use: Not on file  . Sexually Active: Not on file   Other Topics Concern  . Not on file   Social History Narrative  . No narrative on file     Family History  Problem Relation Age of Onset  . Heart disease    . Muscular dystrophy    . Stroke      ROS: General: no fevers/chills/night sweats Eyes: no blurry vision, diplopia, or amaurosis ENT: no sore throat or hearing loss Resp: no cough, wheezing, or hemoptysis CV: no edema or palpitations GI: no abdominal pain, nausea, vomiting, diarrhea, or constipation GU: no dysuria, frequency, or hematuria Skin: no rash Neuro: no headache, numbness, tingling, or weakness of extremities Musculoskeletal: no joint pain or swelling Heme: no bleeding, DVT, or easy bruising Endo: no polydipsia or polyuria  BP 127/60  Pulse 69  Ht 5\' 1"  (1.549 m)  Wt 164 lb (74.39 kg)  BMI 30.99 kg/m2  PHYSICAL EXAM: Pt is alert and oriented, WD, WN, in no distress. HEENT: normal Neck: JVP normal. Carotid upstrokes normal without bruits. No thyromegaly. Lungs: equal expansion, clear bilaterally CV: Apex is discrete and nondisplaced, RRR without murmur or gallop Abd: soft, NT, +BS, no bruit, no hepatosplenomegaly Back: no CVA tenderness Ext: no C/C/E        Femoral pulses 2+= without bruits        DP/PT pulses absent Skin: warm and dry without rash Neuro: CNII-XII intact             Strength intact = bilaterally    ASSESSMENT AND PLAN:

## 2011-07-28 ENCOUNTER — Other Ambulatory Visit (HOSPITAL_COMMUNITY): Payer: Self-pay | Admitting: Nephrology

## 2011-07-28 DIAGNOSIS — I10 Essential (primary) hypertension: Secondary | ICD-10-CM

## 2011-07-28 DIAGNOSIS — N189 Chronic kidney disease, unspecified: Secondary | ICD-10-CM

## 2011-08-02 ENCOUNTER — Encounter (HOSPITAL_COMMUNITY)
Admission: RE | Admit: 2011-08-02 | Discharge: 2011-08-02 | Disposition: A | Discharge status: Home / Self Care | Payer: Medicare PPO | Source: Ambulatory Visit | Attending: Nephrology | Admitting: Nephrology

## 2011-08-02 DIAGNOSIS — I10 Essential (primary) hypertension: Secondary | ICD-10-CM

## 2011-08-02 DIAGNOSIS — E119 Type 2 diabetes mellitus without complications: Secondary | ICD-10-CM | POA: Insufficient documentation

## 2011-08-02 DIAGNOSIS — N189 Chronic kidney disease, unspecified: Secondary | ICD-10-CM | POA: Insufficient documentation

## 2011-08-02 DIAGNOSIS — I129 Hypertensive chronic kidney disease with stage 1 through stage 4 chronic kidney disease, or unspecified chronic kidney disease: Secondary | ICD-10-CM | POA: Insufficient documentation

## 2011-08-02 MED ORDER — TECHNETIUM TC 99M MERTIATIDE
15.0000 | Freq: Once | INTRAVENOUS | Status: AC | PRN
  Administered 2011-08-02: 15 via INTRAVENOUS

## 2011-08-11 ENCOUNTER — Encounter: Payer: Self-pay | Admitting: Cardiovascular Disease

## 2011-08-11 ENCOUNTER — Ambulatory Visit (INDEPENDENT_AMBULATORY_CARE_PROVIDER_SITE_OTHER): Payer: Medicare PPO | Admitting: Cardiovascular Disease

## 2011-08-11 ENCOUNTER — Encounter: Payer: Self-pay | Admitting: *Deleted

## 2011-08-11 VITALS — BP 94/68 | HR 77 | Ht 61.0 in | Wt 166.0 lb

## 2011-08-11 DIAGNOSIS — I701 Atherosclerosis of renal artery: Secondary | ICD-10-CM

## 2011-08-11 NOTE — Patient Instructions (Signed)
Your physician recommends that you schedule a follow-up appointment in: 1 month.  Your physician has requested that you have a renal angiogram. This exam is performed at the hospital. During this exam IV contrast is used to look at arterial blood flow. Please review the information sheet given for details. To be done on August 23, 2011

## 2011-08-11 NOTE — Progress Notes (Signed)
HPI:  This is a 65 year old woman presenting for followup evaluation. She has chronic kidney disease and was recently diagnosed with a small left kidney and dampened flow suggestive of significant proximal renal artery stenosis. She was evaluated for consideration of renal stenting and I referred her to Dr. Mercy Moore with [facility]. He ordered a nuclear renal scan and this confirmed that there was flow to the left kidney. She presents again to discuss renal stenting. She has been monitoring her blood pressure and it has been under very good control. She denies edema, chest pain, or syncope. She has chronic leg pain with ambulation and exertional dyspnea which is unchanged.  Outpatient Encounter Prescriptions as of 08/11/2011  Medication Sig Dispense Refill  . ACTONEL 150 MG tablet TAKE 1 TABLET BY MOUTH ONCE A MONTH-TAKEON THE SAME DAY OF EACH MONTH.  1 tablet  11  . amLODipine (NORVASC) 10 MG tablet TAKE ONE (1) TABLET EACH DAY  30 tablet  11  . aspirin 325 MG tablet Take 325 mg by mouth daily.        Marland Kitchen atorvastatin (LIPITOR) 80 MG tablet TAKE ONE (1) TABLET EACH DAY  30 tablet  6  . benazepril (LOTENSIN) 40 MG tablet TAKE ONE (1) TABLET EACH DAY  90 tablet  3  . clopidogrel (PLAVIX) 75 MG tablet Take 1 tablet (75 mg total) by mouth daily.  30 tablet  11  . ezetimibe (ZETIA) 10 MG tablet Take 1 tablet (10 mg total) by mouth daily.  90 tablet  3  . ferrous sulfate 325 (65 FE) MG tablet Take 325 mg by mouth 2 (two) times daily.        Marland Kitchen glimepiride (AMARYL) 2 MG tablet Take 2 tablets (4 mg total) by mouth 2 (two) times daily.  180 tablet  3  . glucose blood (ONE TOUCH ULTRA TEST) test strip PRN  100 each  5  . hydrochlorothiazide (,MICROZIDE/HYDRODIURIL,) 12.5 MG capsule TAKE ONE (1) CAPSULE EACH DAY  90 capsule  1  . Lancets (ONETOUCH ULTRASOFT) lancets PRN  100 each  5  . metoprolol (LOPRESSOR) 100 MG tablet Take 100 mg by mouth 2 (two) times daily.        . nitroGLYCERIN  (NITROSTAT) 0.4 MG SL tablet Place 0.4 mg under the tongue every 5 (five) minutes as needed.        Marland Kitchen omeprazole (PRILOSEC) 20 MG capsule Take 20 mg by mouth daily.        . sitaGLIPtin (JANUVIA) 100 MG tablet 1/2 - 1 tab by mouth per day for sugar > 200  30 tablet  11    No Known Allergies  Past Medical History  Diagnosis Date  . Glaucoma   . Mitral regurgitation   . CAD (coronary artery disease)   . DM2 (diabetes mellitus, type 2)   . GERD (gastroesophageal reflux disease)   . HLD (hyperlipidemia)   . HTN (hypertension)   . PUD (peptic ulcer disease)   . Anemia   . Carotid stenosis   . Osteoporosis   . PVD (peripheral vascular disease)   . Anemia, iron deficiency    BP 94/68  Pulse 77  Ht 5\' 1"  (1.549 m)  Wt 166 lb (75.297 kg)  BMI 31.37 kg/m2  PHYSICAL EXAM: Pt is alert and oriented, overweight woman in NAD HEENT: normal Neck: JVP - normal, carotids 2+= without bruits Lungs: CTA bilaterally CV: RRR with a grade 2/6 systolic ejection murmur at the left sternal border Abd:  soft, NT, Positive BS, no hepatomegaly Ext: no C/C/E Skin: warm/dry no rash  ASSESSMENT AND PLAN:

## 2011-08-11 NOTE — Assessment & Plan Note (Signed)
The patient has a small left kidney with confirmation of blood flow by renal nuclear scan. The kidney size is borderline but it does not appear to be completely atrophic. The patient has significant chronic kidney disease and after discussion with Dr. Mercy Moore, renal PTA and stenting is indicated to preserve renal function and to prevent progressive atrophy of the left kidney. I will try to perform this procedure with CO2 as long as this agent provides adequate visualization. The patient will hold her hydrochlorothiazide and her ACE inhibitor the day before, day of, and day after the procedure. We will bring her in several hours before the procedure so that she is adequately hydrated with normal saline. Risks, benefits, and alternatives to the proposed procedure of renal angiography with possible PTA and stenting were reviewed with the patient in detail and she agrees to proceed. Specific risks discussed included bleeding, infection, renal artery injury or parenchymal injury, and emergency surgery. The patient is already on dual antiplatelet therapy with aspirin and Plavix. She will continue these throughout the periprocedural period.

## 2011-08-22 ENCOUNTER — Ambulatory Visit: Payer: Medicare PPO | Admitting: Internal Medicine

## 2011-08-22 ENCOUNTER — Telehealth: Payer: Self-pay | Admitting: Cardiovascular Disease

## 2011-08-22 NOTE — Telephone Encounter (Signed)
I spoke with the pt and her daughter is in the ICU at [facility] and is not doing well.  The pt would like to reschedule her procedure to 09/16/11.  I called the hospital and rescheduled the procedure to 09/16/11.  The pt will arrive at 6:30 and follow the previous instructions she was given.  Lemont Fillers aware of schedule change and she will contact the hospital to keep paperwork for new date.

## 2011-08-22 NOTE — Telephone Encounter (Signed)
Pt calling wanting to rs CATH tomorrow. Pt daughter is in ICU in [place]. Please return pt call to discuss further.

## 2011-08-23 ENCOUNTER — Ambulatory Visit (HOSPITAL_COMMUNITY): Admission: RE | Admit: 2011-08-23 | Payer: Medicare PPO | Source: Ambulatory Visit | Admitting: Cardiovascular Disease

## 2011-09-05 ENCOUNTER — Encounter: Payer: Self-pay | Admitting: Internal Medicine

## 2011-09-05 ENCOUNTER — Other Ambulatory Visit (INDEPENDENT_AMBULATORY_CARE_PROVIDER_SITE_OTHER): Payer: Medicare PPO

## 2011-09-05 ENCOUNTER — Ambulatory Visit (INDEPENDENT_AMBULATORY_CARE_PROVIDER_SITE_OTHER): Payer: Medicare PPO | Admitting: Internal Medicine

## 2011-09-05 VITALS — BP 120/70 | HR 70 | Temp 98.0°F | Ht 61.0 in | Wt 170.5 lb

## 2011-09-05 DIAGNOSIS — E119 Type 2 diabetes mellitus without complications: Secondary | ICD-10-CM

## 2011-09-05 DIAGNOSIS — Z0000 Encounter for general adult medical examination without abnormal findings: Secondary | ICD-10-CM

## 2011-09-05 DIAGNOSIS — Z79899 Other long term (current) drug therapy: Secondary | ICD-10-CM

## 2011-09-05 NOTE — Assessment & Plan Note (Signed)
Overall doing well, age appropriate education and counseling updated, referrals for preventative services and immunizations addressed, dietary and smoking counseling addressed, most recent labs and ECG reviewed.  I have personally reviewed and have noted: 1) the patient's medical and social history 2) The pt's use of alcohol, tobacco, and illicit drugs 3) The patient's current medications and supplements 4) Functional ability including ADL's, fall risk, home safety risk, hearing and visual impairment 5) Diet and physical activities 6) Evidence for depression or mood disorder 7) The patient's height, weight, and BMI have been recorded in the chart I have made referrals, and provided counseling and education based on review of the above Otherwise up to date with preventioin today

## 2011-09-05 NOTE — Assessment & Plan Note (Signed)
stable overall by hx and exam, most recent data reviewed with pt, and pt to continue medical treatment as before  Lab Results  Component Value Date   HGBA1C 6.3 04/07/2011

## 2011-09-05 NOTE — Patient Instructions (Addendum)
Continue all other medications as before Please keep your appointments with your specialists as you have planned - renal and cardiology - as you do Please go to LAB in the Basement for the blood and/or urine tests to be done today Please call the phone number [phone] (the [place]) for results of testing in 2-3 days;  When calling, simply dial the number, and when prompted enter the MRN number above (the Medical Record Number) and the # key, then the message should start.  OK to cancel the Nov 22, 2011 appt here  Please return in 6 mo with Lab testing done 3-5 days before  Good luck with your procedure later this month

## 2011-09-05 NOTE — Progress Notes (Signed)
Subjective:    Patient ID: [name], female    DOB: 05/06/1946, 65 y.o.   MRN: [id number]  HPI Here for wellness and f/u;  Overall doing ok;  Pt denies CP, worsening SOB, DOE, wheezing, orthopnea, PND, worsening LE edema, palpitations, dizziness or syncope.  Pt denies neurological change such as new Headache, facial or extremity weakness.  Pt denies polydipsia, polyuria, or low sugar symptoms. Pt states overall good compliance with treatment and medications, good tolerability, and trying to follow lower cholesterol diet.  Pt denies worsening depressive symptoms, suicidal ideation or panic. No fever, wt loss, night sweats, loss of appetite, or other constitutional symptoms.  Pt states good ability with ADL's, low fall risk, home safety reviewed and adequate, no significant changes in hearing or vision, and occasionally active with exercise.  Is due for RAS stent per Dr Burt Knack, re-scheduled for nov 23. Past Medical History  Diagnosis Date  . Glaucoma   . Mitral regurgitation   . CAD (coronary artery disease)   . DM2 (diabetes mellitus, type 2)   . GERD (gastroesophageal reflux disease)   . HLD (hyperlipidemia)   . HTN (hypertension)   . PUD (peptic ulcer disease)   . Anemia   . Carotid stenosis   . Osteoporosis   . PVD (peripheral vascular disease)   . Anemia, iron deficiency    Past Surgical History  Procedure Date  . Appendectomy   . Hysterectomy - unknown type   . Oophorectomy     reports that she quit smoking about 3 years ago. She does not have any smokeless tobacco history on file. She reports that she does not drink alcohol. Her drug history not on file. family history includes Heart disease in an unspecified family member; Muscular dystrophy in an unspecified family member; and Stroke in an unspecified family member. No Known Allergies Current Outpatient Prescriptions on File Prior to Visit  Medication Sig Dispense Refill  . ACTONEL 150 MG tablet TAKE 1 TABLET BY MOUTH  ONCE A MONTH-TAKEON THE SAME DAY OF EACH MONTH.  1 tablet  11  . amLODipine (NORVASC) 10 MG tablet TAKE ONE (1) TABLET EACH DAY  30 tablet  11  . aspirin 325 MG tablet Take 325 mg by mouth daily.        Marland Kitchen atorvastatin (LIPITOR) 80 MG tablet TAKE ONE (1) TABLET EACH DAY  30 tablet  6  . benazepril (LOTENSIN) 40 MG tablet TAKE ONE (1) TABLET EACH DAY  90 tablet  3  . clopidogrel (PLAVIX) 75 MG tablet Take 1 tablet (75 mg total) by mouth daily.  30 tablet  11  . ezetimibe (ZETIA) 10 MG tablet Take 1 tablet (10 mg total) by mouth daily.  90 tablet  3  . ferrous sulfate 325 (65 FE) MG tablet Take 325 mg by mouth 2 (two) times daily.        Marland Kitchen glimepiride (AMARYL) 2 MG tablet Take 2 tablets (4 mg total) by mouth 2 (two) times daily.  180 tablet  3  . glucose blood (ONE TOUCH ULTRA TEST) test strip PRN  100 each  5  . hydrochlorothiazide (,MICROZIDE/HYDRODIURIL,) 12.5 MG capsule TAKE ONE (1) CAPSULE EACH DAY  90 capsule  1  . Lancets (ONETOUCH ULTRASOFT) lancets PRN  100 each  5  . metoprolol (LOPRESSOR) 100 MG tablet Take 100 mg by mouth 2 (two) times daily.        . nitroGLYCERIN (NITROSTAT) 0.4 MG SL tablet Place 0.4 mg under the  tongue every 5 (five) minutes as needed.        Marland Kitchen omeprazole (PRILOSEC) 20 MG capsule Take 20 mg by mouth daily.        . sitaGLIPtin (JANUVIA) 100 MG tablet 1/2 - 1 tab by mouth per day for sugar > 200  30 tablet  11   Review of Systems Review of Systems  Constitutional: Negative for diaphoresis, activity change, appetite change and unexpected weight change.  HENT: Negative for hearing loss, ear pain, facial swelling, mouth sores and neck stiffness.   Eyes: Negative for pain, redness and visual disturbance.  Respiratory: Negative for shortness of breath and wheezing.   Cardiovascular: Negative for chest pain and palpitations.  Gastrointestinal: Negative for diarrhea, blood in stool, abdominal distention and rectal pain.  Genitourinary: Negative for hematuria, flank pain  and decreased urine volume.  Musculoskeletal: Negative for myalgias and joint swelling.  Skin: Negative for color change and wound.  Neurological: Negative for syncope and numbness.  Hematological: Negative for adenopathy.  Psychiatric/Behavioral: Negative for hallucinations, self-injury, decreased concentration and agitation.      Objective:   Physical Exam BP 120/70  Pulse 70  Temp(Src) 98 F (36.7 C) (Oral)  Ht 5\' 1"  (1.549 m)  Wt 170 lb 8 oz (77.338 kg)  BMI 32.22 kg/m2  SpO2 99% Physical Exam  VS noted Constitutional: Pt is oriented to person, place, and time. Appears well-developed and well-nourished.  HENT:  Head: Normocephalic and atraumatic.  Right Ear: External ear normal.  Left Ear: External ear normal.  Nose: Nose normal.  Mouth/Throat: Oropharynx is clear and moist.  Eyes: Conjunctivae and EOM are normal. Pupils are equal, round, and reactive to light.  Neck: Normal range of motion. Neck supple. No JVD present. No tracheal deviation present.  Cardiovascular: Normal rate, regular rhythm, normal heart sounds and intact distal pulses.   Pulmonary/Chest: Effort normal and breath sounds normal.  Abdominal: Soft. Bowel sounds are normal. There is no tenderness.  Musculoskeletal: Normal range of motion. Exhibits no edema.  Lymphadenopathy:  Has no cervical adenopathy.  Neurological: Pt is alert and oriented to person, place, and time. Pt has normal reflexes. No cranial nerve deficit.  Skin: Skin is warm and dry. No rash noted.  Psychiatric:  Has  normal mood and affect. Behavior is normal.     Assessment & Plan:

## 2011-09-06 ENCOUNTER — Encounter (HOSPITAL_COMMUNITY): Payer: Self-pay | Admitting: Pharmacy Technician

## 2011-09-06 LAB — HEPATIC FUNCTION PANEL
ALT: 13 U/L (ref 0–35)
AST: 13 U/L (ref 0–37)
Albumin: 3.9 g/dL (ref 3.5–5.2)
Total Protein: 7.2 g/dL (ref 6.0–8.3)

## 2011-09-06 LAB — CBC WITH DIFFERENTIAL/PLATELET
Basophils Relative: 1.5 % (ref 0.0–3.0)
Eosinophils Relative: 4.2 % (ref 0.0–5.0)
HCT: 28.5 % — ABNORMAL LOW (ref 36.0–46.0)
Hemoglobin: 9.4 g/dL — ABNORMAL LOW (ref 12.0–15.0)
Lymphs Abs: 2.1 10*3/uL (ref 0.7–4.0)
MCV: 85.1 fl (ref 78.0–100.0)
Monocytes Absolute: 0.4 10*3/uL (ref 0.1–1.0)
Neutro Abs: 3.3 10*3/uL (ref 1.4–7.7)
Platelets: 310 10*3/uL (ref 150.0–400.0)
RBC: 3.35 Mil/uL — ABNORMAL LOW (ref 3.87–5.11)
WBC: 6.2 10*3/uL (ref 4.5–10.5)

## 2011-09-06 LAB — BASIC METABOLIC PANEL
BUN: 44 mg/dL — ABNORMAL HIGH (ref 6–23)
Chloride: 114 mEq/L — ABNORMAL HIGH (ref 96–112)
Potassium: 5.6 mEq/L — ABNORMAL HIGH (ref 3.5–5.1)
Sodium: 145 mEq/L (ref 135–145)

## 2011-09-06 LAB — LIPID PANEL
Cholesterol: 165 mg/dL (ref 0–200)
LDL Cholesterol: 90 mg/dL (ref 0–99)

## 2011-09-06 LAB — URINALYSIS, ROUTINE W REFLEX MICROSCOPIC
Bilirubin Urine: NEGATIVE
Hgb urine dipstick: NEGATIVE
Ketones, ur: NEGATIVE
Nitrite: NEGATIVE

## 2011-09-06 LAB — TSH: TSH: 2.02 u[IU]/mL (ref 0.35–5.50)

## 2011-09-07 ENCOUNTER — Other Ambulatory Visit: Payer: Self-pay | Admitting: Cardiology

## 2011-09-13 ENCOUNTER — Ambulatory Visit (INDEPENDENT_AMBULATORY_CARE_PROVIDER_SITE_OTHER): Payer: Medicare PPO | Admitting: Cardiovascular Disease

## 2011-09-13 VITALS — BP 138/62 | HR 68 | Ht 61.0 in | Wt 165.1 lb

## 2011-09-13 DIAGNOSIS — I701 Atherosclerosis of renal artery: Secondary | ICD-10-CM

## 2011-09-13 NOTE — Patient Instructions (Signed)
Your physician recommends that you schedule a follow-up appointment in: [place] has requested that you have a renal artery duplex in 4 WEEKS. During this test, an ultrasound is used to evaluate blood flow to the kidneys. Allow one hour for this exam. Do not eat after midnight the day before and avoid carbonated beverages. Take your medications as you usually do.  Please do not take your Benazepril and HCTZ until further instructions after your procedure.  Please push fluids (Water is best!!) on Thursday.

## 2011-09-13 NOTE — Progress Notes (Signed)
HPI:  This is a 65 year old woman presenting for followup evaluation. 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 She has been noted to have a small left kidney with dampened flow by renal duplex ultrasonography. A nuclear scan confirmed intact perfusion to the left kidney.  The patient has chronic kidney disease and after review with Dr. Mercy Moore we planned on proceeding with renal angiography and possible PTCA/stenting. The patient had a cancel the procedure because her daughter was hospitalized. She presents today for followup. Her procedure has been rescheduled for November 23.  She reports no interval changes in her symptoms.  However, her creatinine has increased since last evaluation. On October 07, 2010 her creatinine was 1.7, on April 07, 2011 it was 2.0, and on September 05, 2011 it was 2.5.  She specifically denies chest pain, dyspnea, or edema.  Outpatient Encounter Prescriptions as of 09/13/2011  Medication Sig Dispense Refill  . ACTONEL 150 MG tablet TAKE 1 TABLET BY MOUTH ONCE A MONTH-TAKEON THE SAME DAY OF EACH MONTH.  1 tablet  11  . amLODipine (NORVASC) 10 MG tablet TAKE ONE (1) TABLET EACH DAY  30 tablet  11  . aspirin 325 MG tablet Take 325 mg by mouth daily.        Marland Kitchen atorvastatin (LIPITOR) 80 MG tablet TAKE ONE (1) TABLET EACH DAY  30 tablet  6  . benazepril (LOTENSIN) 40 MG tablet TAKE ONE (1) TABLET EACH DAY  90 tablet  3  . clopidogrel (PLAVIX) 75 MG tablet Take 1 tablet (75 mg total) by mouth daily.  30 tablet  11  . ezetimibe (ZETIA) 10 MG tablet Take 1 tablet (10 mg total) by mouth daily.  90 tablet  3  . ferrous sulfate 325 (65 FE) MG tablet Take 325 mg by mouth 2 (two) times daily.        Marland Kitchen glimepiride (AMARYL) 2 MG tablet Take 2 tablets (4 mg total) by mouth 2 (two) times daily.  180 tablet  3  . glucose blood (ONE TOUCH ULTRA TEST) test strip PRN  100 each  5  . Lancets (ONETOUCH ULTRASOFT) lancets PRN  100 each  5  . metoprolol  (LOPRESSOR) 100 MG tablet Take 100 mg by mouth 2 (two) times daily.        . nitroGLYCERIN (NITROSTAT) 0.4 MG SL tablet Place 0.4 mg under the tongue every 5 (five) minutes as needed.        Marland Kitchen omeprazole (PRILOSEC) 20 MG capsule Take 20 mg by mouth daily.        . sitaGLIPtin (JANUVIA) 100 MG tablet 1/2 - 1 tab by mouth per day for sugar > 200  30 tablet  11  . timolol (BETIMOL) 0.5 % ophthalmic solution Place 1 drop into both eyes daily.        . hydrochlorothiazide (,MICROZIDE/HYDRODIURIL,) 12.5 MG capsule TAKE ONE (1) CAPSULE EACH DAY  90 capsule  1    No Known Allergies  Past Medical History  Diagnosis Date  . Glaucoma   . Mitral regurgitation   . CAD (coronary artery disease)   . DM2 (diabetes mellitus, type 2)   . GERD (gastroesophageal reflux disease)   . HLD (hyperlipidemia)   . HTN (hypertension)   . PUD (peptic ulcer disease)   . Anemia   . Carotid stenosis   . Osteoporosis   . PVD (peripheral vascular disease)   . Anemia, iron deficiency     BP 138/62  Pulse 68  Ht 5\' 1"  (1.549 m)  Wt 74.898 kg (165 lb 1.9 oz)  BMI 31.20 kg/m2  PHYSICAL EXAM: Pt is alert and oriented, NAD HEENT: normal Neck: JVP - normal, carotids 2+= without bruits Lungs: CTA bilaterally CV: RRR without murmur or gallop Abd: soft, NT, Positive BS, no hepatomegaly Ext: no C/C/E, distal pulses intact and equal Skin: warm/dry no rash  ASSESSMENT AND PLAN:

## 2011-09-13 NOTE — Assessment & Plan Note (Signed)
I reviewed risks, indication, and alternatives to angiography with renal PTA and stenting again with the patient.  Risks discussed included bleeding, infection, renal injury, contrast nephropathy and renal failure, and emergency surgery. The patient understood these risks and agrees to proceed.  She was instructed to hold her benazepril and hydrochlorothiazide beginning today. She was instructed to push fluids the day before her procedure. She will be brought into the short stay for hours before her procedure and normal saline will be started. Labs will be rechecked on arrival to the short stay.

## 2011-09-14 ENCOUNTER — Encounter: Payer: Self-pay | Admitting: Cardiovascular Disease

## 2011-09-16 ENCOUNTER — Ambulatory Visit (HOSPITAL_COMMUNITY)
Admission: RE | Admit: 2011-09-16 | Discharge: 2011-09-16 | Disposition: A | Discharge status: Home / Self Care | Payer: Medicare PPO | Source: Ambulatory Visit | Attending: Cardiovascular Disease | Admitting: Cardiovascular Disease

## 2011-09-16 ENCOUNTER — Encounter (HOSPITAL_COMMUNITY)
Admission: RE | Disposition: A | Discharge status: Home / Self Care | Payer: Self-pay | Source: Ambulatory Visit | Attending: Cardiovascular Disease

## 2011-09-16 DIAGNOSIS — R944 Abnormal results of kidney function studies: Secondary | ICD-10-CM | POA: Insufficient documentation

## 2011-09-16 DIAGNOSIS — I739 Peripheral vascular disease, unspecified: Secondary | ICD-10-CM | POA: Insufficient documentation

## 2011-09-16 DIAGNOSIS — Z5309 Procedure and treatment not carried out because of other contraindication: Secondary | ICD-10-CM | POA: Insufficient documentation

## 2011-09-16 HISTORY — PX: RENAL ANGIOGRAM: SHX5509

## 2011-09-16 LAB — CBC
HCT: 27 % — ABNORMAL LOW (ref 36.0–46.0)
Hemoglobin: 9 g/dL — ABNORMAL LOW (ref 12.0–15.0)
MCHC: 33.3 g/dL (ref 30.0–36.0)
RDW: 14.6 % (ref 11.5–15.5)
WBC: 6.4 10*3/uL (ref 4.0–10.5)

## 2011-09-16 LAB — BASIC METABOLIC PANEL
BUN: 65 mg/dL — ABNORMAL HIGH (ref 6–23)
Chloride: 112 mEq/L (ref 96–112)
GFR calc Af Amer: 16 mL/min — ABNORMAL LOW (ref >90)
GFR calc non Af Amer: 14 mL/min — ABNORMAL LOW (ref >90)
Potassium: 4.5 mEq/L (ref 3.5–5.1)

## 2011-09-16 SURGERY — RENAL ANGIOGRAM
Anesthesia: LOCAL

## 2011-09-16 MED ORDER — CLOPIDOGREL BISULFATE 75 MG PO TABS: 75.0000 mg | ORAL_TABLET | ORAL | Status: DC

## 2011-09-16 MED ORDER — ASPIRIN 81 MG PO CHEW: 324.0000 mg | CHEWABLE_TABLET | ORAL | Status: DC

## 2011-09-16 MED ORDER — SODIUM CHLORIDE 0.9 % IV SOLN: 250.0000 mL | INTRAVENOUS | Status: DC

## 2011-09-16 MED ORDER — SODIUM CHLORIDE 0.9 % IJ SOLN: 3.0000 mL | Freq: Two times a day (BID) | INTRAMUSCULAR | Status: DC

## 2011-09-16 MED ORDER — SODIUM CHLORIDE 0.9 % IJ SOLN: 3.0000 mL | INTRAMUSCULAR | Status: DC | PRN

## 2011-09-16 MED ORDER — SODIUM CHLORIDE 0.9 % IV SOLN
INTRAVENOUS | Status: DC
  Administered 2011-09-16: 08:00:00 via INTRAVENOUS

## 2011-09-16 MED ORDER — DIAZEPAM 5 MG PO TABS: 5.0000 mg | ORAL_TABLET | ORAL | Status: DC

## 2011-09-16 MED ORDER — SODIUM CHLORIDE 0.9 % IV SOLN: INTRAVENOUS | Status: DC

## 2011-09-16 NOTE — Progress Notes (Signed)
Pt presented today for renal angiogram. However, her creatinine has further increased to 3.25 mg/dL up from baseline 2.0 mg/dL and more recent level of 2.5 mg/dL. I spoke with Dr Florene Glen (covering for Dr Mercy Moore) and reviewed case details.  Elected to defer procedure as risk of further renal compromise prohibitive in setting of acute worsening of her renal fxn. I will schedule her for a repeat renal arterial duplex to see if the left renal has occluded. This increase in creatinine has occurred depite holding her ACE-I and HCTZ now for the past 3 days. She was advised to stay off of these meds and I will review her case with Dr Mercy Moore next week.

## 2011-09-19 ENCOUNTER — Telehealth: Payer: Self-pay

## 2011-09-19 NOTE — Telephone Encounter (Signed)
I spoke with the pt and scheduled her for a renal artery duplex on 09/23/11 at 8:30.  I will make Dr Burt Knack aware that this test is scheduled.  This is the earliest appointment available in our office for a renal duplex.

## 2011-09-19 NOTE — Telephone Encounter (Signed)
Dr Burt Knack also needs to discuss this pt with Dr Mercy Moore.

## 2011-09-23 ENCOUNTER — Encounter (INDEPENDENT_AMBULATORY_CARE_PROVIDER_SITE_OTHER): Payer: Medicare PPO | Admitting: Cardiology

## 2011-09-23 ENCOUNTER — Telehealth: Payer: Self-pay | Admitting: Cardiovascular Disease

## 2011-09-23 ENCOUNTER — Other Ambulatory Visit (HOSPITAL_COMMUNITY): Payer: Self-pay | Admitting: Radiology

## 2011-09-23 DIAGNOSIS — I701 Atherosclerosis of renal artery: Secondary | ICD-10-CM

## 2011-09-23 DIAGNOSIS — N189 Chronic kidney disease, unspecified: Secondary | ICD-10-CM

## 2011-09-23 MED ORDER — PERFLUTREN PROTEIN A MICROSPH IV SUSP
0.5000 mL | Freq: Once | INTRAVENOUS | Status: AC
  Administered 2011-09-23: 0.5 mL via INTRAVENOUS

## 2011-09-23 NOTE — Telephone Encounter (Signed)
I spoke with the patient on the telephone regarding her renal duplex findings from earlier today. Her left renal artery appears occluded. The left kidney is 7.6 cm compared to 8 cm previously. I reviewed the findings with Dr. Mercy Moore as well. The patient will followup in his office.

## 2011-09-26 ENCOUNTER — Other Ambulatory Visit (HOSPITAL_COMMUNITY): Payer: Self-pay | Admitting: *Deleted

## 2011-09-29 ENCOUNTER — Encounter (HOSPITAL_COMMUNITY): Payer: Medicare PPO

## 2011-10-05 ENCOUNTER — Encounter (HOSPITAL_COMMUNITY)
Admission: RE | Admit: 2011-10-05 | Discharge: 2011-10-05 | Disposition: A | Discharge status: Home / Self Care | Payer: Medicare PPO | Source: Ambulatory Visit | Attending: Nephrology | Admitting: Nephrology

## 2011-10-05 DIAGNOSIS — D649 Anemia, unspecified: Secondary | ICD-10-CM | POA: Insufficient documentation

## 2011-10-05 DIAGNOSIS — N184 Chronic kidney disease, stage 4 (severe): Secondary | ICD-10-CM | POA: Insufficient documentation

## 2011-10-05 LAB — BASIC METABOLIC PANEL
BUN: 51 mg/dL — ABNORMAL HIGH (ref 6–23)
Calcium: 9.2 mg/dL (ref 8.4–10.5)
Creatinine, Ser: 2.34 mg/dL — ABNORMAL HIGH (ref 0.50–1.10)
GFR calc Af Amer: 24 mL/min — ABNORMAL LOW (ref >90)
GFR calc non Af Amer: 20 mL/min — ABNORMAL LOW (ref >90)

## 2011-10-05 MED ORDER — EPOETIN ALFA 20000 UNIT/ML IJ SOLN
INTRAMUSCULAR | Status: AC
  Administered 2011-10-05 (×2): via SUBCUTANEOUS
  Filled 2011-10-05: qty 1

## 2011-10-05 MED ORDER — EPOETIN ALFA 10000 UNIT/ML IJ SOLN: 20000.0000 [IU] | INTRAMUSCULAR | Status: DC

## 2011-10-06 ENCOUNTER — Other Ambulatory Visit: Payer: Self-pay | Admitting: Cardiology

## 2011-10-13 ENCOUNTER — Encounter: Payer: Medicare PPO | Admitting: Cardiology

## 2011-10-13 ENCOUNTER — Ambulatory Visit: Payer: Medicare PPO | Admitting: Cardiovascular Disease

## 2011-10-17 ENCOUNTER — Other Ambulatory Visit: Payer: Self-pay | Admitting: Internal Medicine

## 2011-10-19 ENCOUNTER — Encounter (HOSPITAL_COMMUNITY)
Admission: RE | Admit: 2011-10-19 | Discharge: 2011-10-19 | Disposition: A | Discharge status: Home / Self Care | Payer: Medicare PPO | Source: Ambulatory Visit | Attending: Nephrology | Admitting: Nephrology

## 2011-10-19 LAB — POCT HEMOGLOBIN-HEMACUE: Hemoglobin: 9 g/dL — ABNORMAL LOW (ref 12.0–15.0)

## 2011-10-19 MED ORDER — EPOETIN ALFA 20000 UNIT/ML IJ SOLN
INTRAMUSCULAR | Status: AC
  Administered 2011-10-19: 20000 [IU] via SUBCUTANEOUS
  Filled 2011-10-19: qty 1

## 2011-10-19 MED ORDER — EPOETIN ALFA 10000 UNIT/ML IJ SOLN: 20000.0000 [IU] | INTRAMUSCULAR | Status: DC

## 2011-10-27 ENCOUNTER — Other Ambulatory Visit: Payer: Self-pay | Admitting: Internal Medicine

## 2011-10-31 ENCOUNTER — Other Ambulatory Visit (HOSPITAL_COMMUNITY): Payer: Self-pay | Admitting: *Deleted

## 2011-11-02 ENCOUNTER — Inpatient Hospital Stay (HOSPITAL_COMMUNITY): Admission: RE | Admit: 2011-11-02 | Payer: Medicare PPO | Source: Ambulatory Visit

## 2011-11-02 ENCOUNTER — Encounter (HOSPITAL_COMMUNITY): Payer: Medicare PPO

## 2011-11-03 ENCOUNTER — Encounter (HOSPITAL_COMMUNITY)
Admission: RE | Admit: 2011-11-03 | Discharge: 2011-11-03 | Disposition: A | Discharge status: Home / Self Care | Payer: Medicare PPO | Source: Ambulatory Visit | Attending: Nephrology | Admitting: Nephrology

## 2011-11-03 DIAGNOSIS — D649 Anemia, unspecified: Secondary | ICD-10-CM | POA: Insufficient documentation

## 2011-11-03 DIAGNOSIS — N184 Chronic kidney disease, stage 4 (severe): Secondary | ICD-10-CM | POA: Insufficient documentation

## 2011-11-03 LAB — POCT HEMOGLOBIN-HEMACUE: Hemoglobin: 10.8 g/dL — ABNORMAL LOW (ref 12.0–15.0)

## 2011-11-03 MED ORDER — EPOETIN ALFA 20000 UNIT/ML IJ SOLN
INTRAMUSCULAR | Status: AC
  Filled 2011-11-03: qty 1

## 2011-11-03 MED ORDER — FERUMOXYTOL INJECTION 510 MG/17 ML
510.0000 mg | Freq: Once | INTRAVENOUS | Status: AC
  Administered 2011-11-03: 510 mg via INTRAVENOUS

## 2011-11-03 MED ORDER — EPOETIN ALFA 10000 UNIT/ML IJ SOLN
20000.0000 [IU] | INTRAMUSCULAR | Status: DC
  Administered 2011-11-03: 20000 [IU] via SUBCUTANEOUS

## 2011-11-03 MED ORDER — FERUMOXYTOL INJECTION 510 MG/17 ML
INTRAVENOUS | Status: AC
  Administered 2011-11-03: 510 mg via INTRAVENOUS
  Filled 2011-11-03: qty 17

## 2011-11-16 ENCOUNTER — Encounter (HOSPITAL_COMMUNITY): Payer: Medicare PPO

## 2011-11-16 ENCOUNTER — Encounter (HOSPITAL_COMMUNITY)
Admission: RE | Admit: 2011-11-16 | Discharge: 2011-11-16 | Disposition: A | Discharge status: Home / Self Care | Payer: Medicare PPO | Source: Ambulatory Visit | Attending: Nephrology | Admitting: Nephrology

## 2011-11-16 MED ORDER — EPOETIN ALFA 20000 UNIT/ML IJ SOLN
INTRAMUSCULAR | Status: AC
  Administered 2011-11-16: 20000 [IU] via SUBCUTANEOUS
  Filled 2011-11-16: qty 1

## 2011-11-16 MED ORDER — EPOETIN ALFA 10000 UNIT/ML IJ SOLN: 20000.0000 [IU] | INTRAMUSCULAR | Status: DC

## 2011-11-17 LAB — POCT HEMOGLOBIN-HEMACUE: Hemoglobin: 11.4 g/dL — ABNORMAL LOW (ref 12.0–15.0)

## 2011-11-22 ENCOUNTER — Ambulatory Visit: Payer: Medicare PPO | Admitting: Internal Medicine

## 2011-11-29 ENCOUNTER — Other Ambulatory Visit: Payer: Self-pay | Admitting: Internal Medicine

## 2011-11-30 ENCOUNTER — Encounter (HOSPITAL_COMMUNITY): Payer: Medicare PPO

## 2011-11-30 ENCOUNTER — Encounter (HOSPITAL_COMMUNITY)
Admission: RE | Admit: 2011-11-30 | Discharge: 2011-11-30 | Disposition: A | Discharge status: Home / Self Care | Payer: Medicare PPO | Source: Ambulatory Visit | Attending: Nephrology | Admitting: Nephrology

## 2011-11-30 DIAGNOSIS — N184 Chronic kidney disease, stage 4 (severe): Secondary | ICD-10-CM | POA: Insufficient documentation

## 2011-11-30 DIAGNOSIS — D649 Anemia, unspecified: Secondary | ICD-10-CM | POA: Insufficient documentation

## 2011-11-30 MED ORDER — EPOETIN ALFA 10000 UNIT/ML IJ SOLN: 20000.0000 [IU] | INTRAMUSCULAR | Status: DC

## 2011-11-30 MED ORDER — EPOETIN ALFA 20000 UNIT/ML IJ SOLN
INTRAMUSCULAR | Status: AC
  Administered 2011-11-30: 20000 [IU] via SUBCUTANEOUS
  Filled 2011-11-30: qty 1

## 2011-12-14 ENCOUNTER — Encounter (HOSPITAL_COMMUNITY)
Admission: RE | Admit: 2011-12-14 | Discharge: 2011-12-14 | Disposition: A | Discharge status: Home / Self Care | Payer: Medicare PPO | Source: Ambulatory Visit | Attending: Nephrology | Admitting: Nephrology

## 2011-12-14 ENCOUNTER — Encounter (HOSPITAL_COMMUNITY): Payer: Medicare PPO

## 2011-12-14 MED ORDER — EPOETIN ALFA 10000 UNIT/ML IJ SOLN: 20000.0000 [IU] | INTRAMUSCULAR | Status: DC

## 2011-12-27 ENCOUNTER — Other Ambulatory Visit (HOSPITAL_COMMUNITY): Payer: Self-pay | Admitting: *Deleted

## 2011-12-28 ENCOUNTER — Encounter (HOSPITAL_COMMUNITY)
Admission: RE | Admit: 2011-12-28 | Discharge: 2011-12-28 | Disposition: A | Discharge status: Home / Self Care | Payer: Medicare PPO | Source: Ambulatory Visit | Attending: Nephrology | Admitting: Nephrology

## 2011-12-28 DIAGNOSIS — N184 Chronic kidney disease, stage 4 (severe): Secondary | ICD-10-CM | POA: Insufficient documentation

## 2011-12-28 DIAGNOSIS — D649 Anemia, unspecified: Secondary | ICD-10-CM | POA: Insufficient documentation

## 2011-12-28 LAB — POCT HEMOGLOBIN-HEMACUE: Hemoglobin: 10.9 g/dL — ABNORMAL LOW (ref 12.0–15.0)

## 2011-12-28 MED ORDER — EPOETIN ALFA 10000 UNIT/ML IJ SOLN: 20000.0000 [IU] | INTRAMUSCULAR | Status: DC

## 2011-12-28 MED ORDER — EPOETIN ALFA 20000 UNIT/ML IJ SOLN
INTRAMUSCULAR | Status: AC
  Administered 2011-12-28: 20000 [IU] via SUBCUTANEOUS
  Filled 2011-12-28: qty 1

## 2012-01-11 ENCOUNTER — Encounter (HOSPITAL_COMMUNITY)
Admission: RE | Admit: 2012-01-11 | Discharge: 2012-01-11 | Disposition: A | Discharge status: Home / Self Care | Payer: Medicare PPO | Source: Ambulatory Visit | Attending: Nephrology | Admitting: Nephrology

## 2012-01-11 LAB — POCT HEMOGLOBIN-HEMACUE: Hemoglobin: 11.3 g/dL — ABNORMAL LOW (ref 12.0–15.0)

## 2012-01-11 LAB — IRON AND TIBC: TIBC: 328 ug/dL (ref 250–470)

## 2012-01-11 MED ORDER — EPOETIN ALFA 20000 UNIT/ML IJ SOLN
INTRAMUSCULAR | Status: AC
  Administered 2012-01-11: 20000 [IU]
  Filled 2012-01-11: qty 1

## 2012-01-11 MED ORDER — EPOETIN ALFA 10000 UNIT/ML IJ SOLN: 20000.0000 [IU] | INTRAMUSCULAR | Status: DC

## 2012-01-19 ENCOUNTER — Other Ambulatory Visit: Payer: Self-pay | Admitting: Internal Medicine

## 2012-01-25 ENCOUNTER — Encounter (HOSPITAL_COMMUNITY): Payer: Medicare PPO

## 2012-02-01 ENCOUNTER — Encounter (HOSPITAL_COMMUNITY): Payer: Medicare PPO

## 2012-02-08 ENCOUNTER — Encounter (HOSPITAL_COMMUNITY): Payer: Medicare PPO

## 2012-02-15 ENCOUNTER — Encounter (HOSPITAL_COMMUNITY)
Admission: RE | Admit: 2012-02-15 | Discharge: 2012-02-15 | Disposition: A | Discharge status: Home / Self Care | Payer: Medicare PPO | Source: Ambulatory Visit | Attending: Nephrology | Admitting: Nephrology

## 2012-02-15 DIAGNOSIS — D649 Anemia, unspecified: Secondary | ICD-10-CM | POA: Insufficient documentation

## 2012-02-15 DIAGNOSIS — N184 Chronic kidney disease, stage 4 (severe): Secondary | ICD-10-CM | POA: Insufficient documentation

## 2012-02-15 LAB — POCT HEMOGLOBIN-HEMACUE: Hemoglobin: 10.2 g/dL — ABNORMAL LOW (ref 12.0–15.0)

## 2012-02-15 MED ORDER — EPOETIN ALFA 10000 UNIT/ML IJ SOLN: 20000.0000 [IU] | INTRAMUSCULAR | Status: DC

## 2012-02-15 MED ORDER — EPOETIN ALFA 20000 UNIT/ML IJ SOLN
INTRAMUSCULAR | Status: AC
  Administered 2012-02-15: 20000 [IU] via SUBCUTANEOUS
  Filled 2012-02-15: qty 1

## 2012-02-22 ENCOUNTER — Telehealth: Payer: Self-pay

## 2012-02-22 MED ORDER — GLIMEPIRIDE 4 MG PO TABS: 4.0000 mg | ORAL_TABLET | Freq: Every day | ORAL | Status: DC

## 2012-02-22 NOTE — Telephone Encounter (Signed)
The patient called to inform BS has been over 200 for 2 days. The patient has appt. On 03/06/12 she would like to know if what to do and if she should be seen before the 14th. Call back number is [phone]

## 2012-02-22 NOTE — Telephone Encounter (Signed)
I think she is probably taking the glimeparide 2 mg twice per day (not 2 pills twice per day, as the last rx only was rx for 180 for 3 mo)  Please verify with pt;  Ok to incr the rx to the 4 mg tab bid, cont other med as is (new rx sent per emr)  Can try this and f/u on the 14th, as long as no fever, pain or other new symptoms

## 2012-02-23 NOTE — Telephone Encounter (Signed)
Patient informed of medication change and instructions from MD.

## 2012-02-29 ENCOUNTER — Encounter (HOSPITAL_COMMUNITY)
Admission: RE | Admit: 2012-02-29 | Discharge: 2012-02-29 | Disposition: A | Discharge status: Home / Self Care | Payer: Medicare PPO | Source: Ambulatory Visit | Attending: Nephrology | Admitting: Nephrology

## 2012-02-29 DIAGNOSIS — N184 Chronic kidney disease, stage 4 (severe): Secondary | ICD-10-CM | POA: Insufficient documentation

## 2012-02-29 DIAGNOSIS — D649 Anemia, unspecified: Secondary | ICD-10-CM | POA: Insufficient documentation

## 2012-02-29 LAB — POCT HEMOGLOBIN-HEMACUE: Hemoglobin: 10.7 g/dL — ABNORMAL LOW (ref 12.0–15.0)

## 2012-02-29 MED ORDER — EPOETIN ALFA 20000 UNIT/ML IJ SOLN
INTRAMUSCULAR | Status: AC
  Administered 2012-02-29: 20000 [IU] via SUBCUTANEOUS
  Filled 2012-02-29: qty 1

## 2012-02-29 MED ORDER — EPOETIN ALFA 10000 UNIT/ML IJ SOLN: 20000.0000 [IU] | INTRAMUSCULAR | Status: DC

## 2012-03-06 ENCOUNTER — Ambulatory Visit: Payer: Medicare PPO | Admitting: Internal Medicine

## 2012-03-07 ENCOUNTER — Encounter: Payer: Self-pay | Admitting: Internal Medicine

## 2012-03-07 ENCOUNTER — Ambulatory Visit (INDEPENDENT_AMBULATORY_CARE_PROVIDER_SITE_OTHER): Payer: Medicare PPO | Admitting: Internal Medicine

## 2012-03-07 VITALS — BP 138/70 | HR 67 | Temp 97.0°F | Ht 61.0 in | Wt 184.2 lb

## 2012-03-07 DIAGNOSIS — M5432 Sciatica, left side: Secondary | ICD-10-CM

## 2012-03-07 DIAGNOSIS — E785 Hyperlipidemia, unspecified: Secondary | ICD-10-CM

## 2012-03-07 DIAGNOSIS — M5430 Sciatica, unspecified side: Secondary | ICD-10-CM

## 2012-03-07 DIAGNOSIS — E119 Type 2 diabetes mellitus without complications: Secondary | ICD-10-CM

## 2012-03-07 DIAGNOSIS — N189 Chronic kidney disease, unspecified: Secondary | ICD-10-CM

## 2012-03-07 DIAGNOSIS — I10 Essential (primary) hypertension: Secondary | ICD-10-CM

## 2012-03-07 MED ORDER — TRAMADOL HCL 50 MG PO TABS: 50.0000 mg | ORAL_TABLET | Freq: Four times a day (QID) | ORAL | Status: AC | PRN

## 2012-03-07 MED ORDER — PREDNISONE 10 MG PO TABS: ORAL_TABLET | ORAL | Status: DC

## 2012-03-07 NOTE — Progress Notes (Signed)
Subjective:    Patient ID: [name], female    DOB: 22-Aug-1946, 66 y.o.   MRN: [id number]    HPI  Here to f/u, had recent worsening CKD status, unable to have the renal angiogram per Dr Burt Knack, and followed very closely thereafter per Dr Mercy Moore, with labs every 2 wks, with what sounds like Aranesp inj q 2 wks.   Today mentions a recent worsening/acute onset about 2 wks center and left LBP, mild to mod, occas severe, intermittent, but bowel or bladder change, fever, wt loss,  worsening LE pain/numbness/weakness, gait change or falls, except for pain radiation to the left foot and tends to limp.  Pain in back worse to bend or lifting, better to sit or lie down.  Has numbness to fingers off and on chronic no change.  No overt bleeding or bruising on Plavix.  Pt denies chest pain, increased sob or doe, wheezing, orthopnea, PND, increased LE swelling, palpitations, dizziness or syncope.   Pt denies polydipsia, polyuria, or low sugar symptoms such as weakness or confusion improved with po intake.  Pt states overall good compliance with meds, trying to follow lower cholesterol, diabetic diet, wt overall stable but little exercise however.    Past Medical History  Diagnosis Date  . Glaucoma   . Mitral regurgitation   . CAD (coronary artery disease)   . DM2 (diabetes mellitus, type 2)   . GERD (gastroesophageal reflux disease)   . HLD (hyperlipidemia)   . HTN (hypertension)   . PUD (peptic ulcer disease)   . Anemia   . Carotid stenosis   . Osteoporosis   . PVD (peripheral vascular disease)   . Anemia, iron deficiency    Past Surgical History  Procedure Date  . Appendectomy   . Hysterectomy - unknown type   . Oophorectomy     reports that she quit smoking about 4 years ago. She does not have any smokeless tobacco history on file. She reports that she does not drink alcohol. Her drug history not on file. family history includes Heart disease in an unspecified family member; Muscular  dystrophy in an unspecified family member; and Stroke in an unspecified family member. No Known Allergies Current Outpatient Prescriptions on File Prior to Visit  Medication Sig Dispense Refill  . ACTONEL 150 MG tablet TAKE 1 TABLET BY MOUTH ONCE A MONTH-TAKEON THE SAME DAY OF EACH MONTH.  1 tablet  8  . amLODipine (NORVASC) 10 MG tablet TAKE ONE (1) TABLET EACH DAY  30 tablet  11  . aspirin 325 MG tablet Take 325 mg by mouth daily.        Marland Kitchen atorvastatin (LIPITOR) 80 MG tablet TAKE ONE (1) TABLET EACH DAY  30 tablet  6  . benazepril (LOTENSIN) 40 MG tablet Take 40 mg by mouth daily. On hold for procedure       . clopidogrel (PLAVIX) 75 MG tablet Take 1 tablet (75 mg total) by mouth daily.  30 tablet  11  . ezetimibe (ZETIA) 10 MG tablet Take 1 tablet (10 mg total) by mouth daily.  90 tablet  3  . ferrous sulfate 325 (65 FE) MG tablet Take 325 mg by mouth 2 (two) times daily.        . furosemide (LASIX) 80 MG tablet Take 80 mg by mouth 2 (two) times daily.      Marland Kitchen glimepiride (AMARYL) 4 MG tablet Take 1 tablet (4 mg total) by mouth daily before breakfast.  180 tablet  3  . glucose blood (ONE TOUCH ULTRA TEST) test strip PRN  100 each  5  . Lancets (ONETOUCH ULTRASOFT) lancets PRN  100 each  5  . metoprolol (LOPRESSOR) 100 MG tablet TAKE 1 TABLET 2 TIMES A DAY  60 tablet  5  . NITROSTAT 0.4 MG SL tablet USE AS DIRECTED  25 tablet  4  . omeprazole (PRILOSEC) 20 MG capsule TAKE ONE (1) CAPSULE EACH DAY  30 capsule  11  . sitaGLIPtin (JANUVIA) 100 MG tablet 1/2 - 1 tab by mouth per day for sugar > 200  30 tablet  11  . timolol (BETIMOL) 0.5 % ophthalmic solution Place 1 drop into both eyes daily.         Review of Systems Constitutional: Negative for diaphoresis and unexpected weight change.  HENT: Negative for drooling and tinnitus.   Eyes: Negative for photophobia and visual disturbance.  Respiratory: Negative for choking and stridor.   Gastrointestinal: Negative for vomiting and blood in  stool.  Genitourinary: Negative for hematuria and decreased urine volume.  Skin: Negative for color change and wound.  Neurological: Negative for tremors and numbness. except for the above Psychiatric/Behavioral: Negative for decreased concentration. The patient is not hyperactive.      Objective:   Physical Exam BP 138/70  Pulse 67  Temp(Src) 97 F (36.1 C) (Oral)  Ht 5\' 1"  (1.549 m)  Wt 184 lb 4 oz (83.575 kg)  BMI 34.81 kg/m2  SpO2 97% Physical Exam  VS noted Constitutional: Pt appears well-developed and well-nourished.  HENT: Head: Normocephalic.  Right Ear: External ear normal.  Left Ear: External ear normal.  Eyes: Conjunctivae and EOM are normal. Pupils are equal, round, and reactive to light.  Neck: Normal range of motion. Neck supple.  Cardiovascular: Normal rate and regular rhythm.   Pulmonary/Chest: Effort normal and breath sounds normal.  Abd:  Soft, NT, non-distended, + BS Neurological: Pt is alert. No cranial nerve deficit. motor/dtr/gait intact Skin: Skin is warm. No erythema.  Psychiatric: Pt behavior is normal. Thought content normal.  Spine and paravertebral : nontender, no swelling, erythema    Assessment & Plan:

## 2012-03-07 NOTE — Patient Instructions (Addendum)
Take all new medications as prescribed - the pain medication and low dose/short course prednisone Continue all other medications as before Please keep your appointments with your specialists as you have planned - the every 2 wks blood tests Please call if you have low blood sugars as we may need to cut back on your diabetic medication if the kidneys slow further Please return in 6 mo with Lab testing done 3-5 days before

## 2012-03-10 ENCOUNTER — Encounter: Payer: Self-pay | Admitting: Internal Medicine

## 2012-03-10 DIAGNOSIS — M5432 Sciatica, left side: Secondary | ICD-10-CM | POA: Insufficient documentation

## 2012-03-10 NOTE — Assessment & Plan Note (Signed)
stable overall by hx and exam, most recent data reviewed with pt, and pt to continue medical treatment as before Lab Results  Component Value Date   LDLCALC 90 09/05/2011

## 2012-03-10 NOTE — Assessment & Plan Note (Signed)
Mild symptomatic, for pain control and short course predpack asd,,  to f/u any worsening symptoms or concerns

## 2012-03-10 NOTE — Assessment & Plan Note (Signed)
stable overall by hx and exam, most recent data reviewed with pt, and pt to continue medical treatment as before Lab Results  Component Value Date   HGBA1C 6.3 04/07/2011

## 2012-03-10 NOTE — Assessment & Plan Note (Signed)
Also to f/u nephrology,  to f/u any worsening symptoms or concerns

## 2012-03-10 NOTE — Assessment & Plan Note (Signed)
stable overall by hx and exam, most recent data reviewed with pt, and pt to continue medical treatment as before BP Readings from Last 3 Encounters:  03/07/12 138/70  02/29/12 104/56  02/15/12 137/70

## 2012-03-14 ENCOUNTER — Encounter (HOSPITAL_COMMUNITY)
Admission: RE | Admit: 2012-03-14 | Discharge: 2012-03-14 | Disposition: A | Discharge status: Home / Self Care | Payer: Medicare PPO | Source: Ambulatory Visit | Attending: Nephrology | Admitting: Nephrology

## 2012-03-14 LAB — POCT HEMOGLOBIN-HEMACUE: Hemoglobin: 11.4 g/dL — ABNORMAL LOW (ref 12.0–15.0)

## 2012-03-14 MED ORDER — EPOETIN ALFA 20000 UNIT/ML IJ SOLN
INTRAMUSCULAR | Status: AC
  Administered 2012-03-14: 20000 [IU]
  Filled 2012-03-14: qty 1

## 2012-03-14 MED ORDER — EPOETIN ALFA 10000 UNIT/ML IJ SOLN: 20000.0000 [IU] | INTRAMUSCULAR | Status: DC

## 2012-03-23 ENCOUNTER — Other Ambulatory Visit: Payer: Self-pay | Admitting: *Deleted

## 2012-03-23 DIAGNOSIS — I6529 Occlusion and stenosis of unspecified carotid artery: Secondary | ICD-10-CM

## 2012-03-27 ENCOUNTER — Other Ambulatory Visit: Payer: Self-pay | Admitting: *Deleted

## 2012-03-27 ENCOUNTER — Encounter (INDEPENDENT_AMBULATORY_CARE_PROVIDER_SITE_OTHER): Payer: Medicare PPO

## 2012-03-27 DIAGNOSIS — I6529 Occlusion and stenosis of unspecified carotid artery: Secondary | ICD-10-CM

## 2012-03-28 ENCOUNTER — Encounter (HOSPITAL_COMMUNITY)
Admission: RE | Admit: 2012-03-28 | Discharge: 2012-03-28 | Disposition: A | Discharge status: Home / Self Care | Payer: Medicare PPO | Source: Ambulatory Visit | Attending: Nephrology | Admitting: Nephrology

## 2012-03-28 DIAGNOSIS — D649 Anemia, unspecified: Secondary | ICD-10-CM | POA: Insufficient documentation

## 2012-03-28 DIAGNOSIS — N184 Chronic kidney disease, stage 4 (severe): Secondary | ICD-10-CM | POA: Insufficient documentation

## 2012-03-28 LAB — POCT HEMOGLOBIN-HEMACUE: Hemoglobin: 11.6 g/dL — ABNORMAL LOW (ref 12.0–15.0)

## 2012-03-28 MED ORDER — EPOETIN ALFA 10000 UNIT/ML IJ SOLN: 20000.0000 [IU] | INTRAMUSCULAR | Status: DC

## 2012-03-28 MED ORDER — EPOETIN ALFA 20000 UNIT/ML IJ SOLN
INTRAMUSCULAR | Status: AC
  Administered 2012-03-28: 20000 [IU] via SUBCUTANEOUS
  Filled 2012-03-28: qty 1

## 2012-04-09 ENCOUNTER — Other Ambulatory Visit: Payer: Self-pay | Admitting: Cardiology

## 2012-04-11 ENCOUNTER — Encounter (HOSPITAL_COMMUNITY)
Admission: RE | Admit: 2012-04-11 | Discharge: 2012-04-11 | Disposition: A | Discharge status: Home / Self Care | Payer: Medicare PPO | Source: Ambulatory Visit | Attending: Nephrology | Admitting: Nephrology

## 2012-04-11 LAB — POCT HEMOGLOBIN-HEMACUE: Hemoglobin: 11.2 g/dL — ABNORMAL LOW (ref 12.0–15.0)

## 2012-04-11 LAB — IRON AND TIBC
Iron: 91 ug/dL (ref 42–135)
UIBC: 215 ug/dL (ref 125–400)

## 2012-04-11 MED ORDER — EPOETIN ALFA 20000 UNIT/ML IJ SOLN
INTRAMUSCULAR | Status: AC
  Administered 2012-04-11: 20000 [IU]
  Filled 2012-04-11: qty 1

## 2012-04-11 MED ORDER — EPOETIN ALFA 10000 UNIT/ML IJ SOLN: 20000.0000 [IU] | INTRAMUSCULAR | Status: DC

## 2012-04-25 ENCOUNTER — Encounter (HOSPITAL_COMMUNITY)
Admission: RE | Admit: 2012-04-25 | Discharge: 2012-04-25 | Disposition: A | Discharge status: Home / Self Care | Payer: Medicare PPO | Source: Ambulatory Visit | Attending: Nephrology | Admitting: Nephrology

## 2012-04-25 DIAGNOSIS — D649 Anemia, unspecified: Secondary | ICD-10-CM | POA: Insufficient documentation

## 2012-04-25 DIAGNOSIS — N184 Chronic kidney disease, stage 4 (severe): Secondary | ICD-10-CM | POA: Insufficient documentation

## 2012-04-25 MED ORDER — EPOETIN ALFA 10000 UNIT/ML IJ SOLN: 20000.0000 [IU] | INTRAMUSCULAR | Status: DC

## 2012-05-02 ENCOUNTER — Other Ambulatory Visit: Payer: Self-pay | Admitting: Internal Medicine

## 2012-05-08 ENCOUNTER — Other Ambulatory Visit (HOSPITAL_COMMUNITY): Payer: Self-pay | Admitting: *Deleted

## 2012-05-09 ENCOUNTER — Other Ambulatory Visit: Payer: Self-pay | Admitting: Internal Medicine

## 2012-05-09 ENCOUNTER — Encounter (HOSPITAL_COMMUNITY)
Admission: RE | Admit: 2012-05-09 | Discharge: 2012-05-09 | Disposition: A | Discharge status: Home / Self Care | Payer: Medicare PPO | Source: Ambulatory Visit | Attending: Nephrology | Admitting: Nephrology

## 2012-05-09 MED ORDER — EPOETIN ALFA 20000 UNIT/ML IJ SOLN
INTRAMUSCULAR | Status: AC
  Administered 2012-05-09: 20000 [IU] via SUBCUTANEOUS
  Filled 2012-05-09: qty 1

## 2012-05-09 MED ORDER — EPOETIN ALFA 10000 UNIT/ML IJ SOLN
20000.0000 [IU] | INTRAMUSCULAR | Status: DC
Start: 2012-05-09 — End: 2012-05-10

## 2012-05-16 ENCOUNTER — Encounter (HOSPITAL_COMMUNITY): Payer: Medicare PPO

## 2012-05-17 ENCOUNTER — Other Ambulatory Visit: Payer: Self-pay | Admitting: Internal Medicine

## 2012-05-23 ENCOUNTER — Encounter (HOSPITAL_COMMUNITY)
Admission: RE | Admit: 2012-05-23 | Discharge: 2012-05-23 | Disposition: A | Discharge status: Home / Self Care | Payer: Medicare PPO | Source: Ambulatory Visit | Attending: Nephrology | Admitting: Nephrology

## 2012-05-23 LAB — POCT HEMOGLOBIN-HEMACUE
Hemoglobin: 11.6 g/dL — ABNORMAL LOW (ref 12.0–15.0)
Hemoglobin: 8.9 g/dL — ABNORMAL LOW (ref 12.0–15.0)

## 2012-05-23 MED ORDER — EPOETIN ALFA 20000 UNIT/ML IJ SOLN
INTRAMUSCULAR | Status: AC
  Administered 2012-05-23: 20000 [IU] via SUBCUTANEOUS
  Filled 2012-05-23: qty 1

## 2012-05-23 MED ORDER — EPOETIN ALFA 10000 UNIT/ML IJ SOLN: 20000.0000 [IU] | INTRAMUSCULAR | Status: DC

## 2012-05-25 ENCOUNTER — Other Ambulatory Visit: Payer: Self-pay | Admitting: Internal Medicine

## 2012-06-13 ENCOUNTER — Encounter (HOSPITAL_COMMUNITY)
Admission: RE | Admit: 2012-06-13 | Discharge: 2012-06-13 | Disposition: A | Discharge status: Home / Self Care | Payer: Medicare PPO | Source: Ambulatory Visit | Attending: Nephrology | Admitting: Nephrology

## 2012-06-13 DIAGNOSIS — N184 Chronic kidney disease, stage 4 (severe): Secondary | ICD-10-CM | POA: Insufficient documentation

## 2012-06-13 DIAGNOSIS — D649 Anemia, unspecified: Secondary | ICD-10-CM | POA: Insufficient documentation

## 2012-06-13 LAB — POCT HEMOGLOBIN-HEMACUE: Hemoglobin: 11 g/dL — ABNORMAL LOW (ref 12.0–15.0)

## 2012-06-13 MED ORDER — EPOETIN ALFA 20000 UNIT/ML IJ SOLN
INTRAMUSCULAR | Status: AC
  Administered 2012-06-13: 20000 [IU] via SUBCUTANEOUS
  Filled 2012-06-13: qty 1

## 2012-06-13 MED ORDER — EPOETIN ALFA 10000 UNIT/ML IJ SOLN: 20000.0000 [IU] | INTRAMUSCULAR | Status: DC

## 2012-07-11 ENCOUNTER — Encounter (HOSPITAL_COMMUNITY)
Admission: RE | Admit: 2012-07-11 | Discharge: 2012-07-11 | Disposition: A | Discharge status: Home / Self Care | Payer: Medicare PPO | Source: Ambulatory Visit | Attending: Nephrology | Admitting: Nephrology

## 2012-07-11 DIAGNOSIS — N184 Chronic kidney disease, stage 4 (severe): Secondary | ICD-10-CM | POA: Insufficient documentation

## 2012-07-11 DIAGNOSIS — D649 Anemia, unspecified: Secondary | ICD-10-CM | POA: Insufficient documentation

## 2012-07-11 MED ORDER — EPOETIN ALFA 20000 UNIT/ML IJ SOLN
INTRAMUSCULAR | Status: AC
  Administered 2012-07-11: 20000 [IU] via SUBCUTANEOUS
  Filled 2012-07-11: qty 1

## 2012-07-11 MED ORDER — EPOETIN ALFA 10000 UNIT/ML IJ SOLN: 20000.0000 [IU] | INTRAMUSCULAR | Status: DC

## 2012-07-27 ENCOUNTER — Other Ambulatory Visit: Payer: Self-pay | Admitting: Internal Medicine

## 2012-07-31 ENCOUNTER — Other Ambulatory Visit (HOSPITAL_COMMUNITY): Payer: Self-pay | Admitting: *Deleted

## 2012-08-01 ENCOUNTER — Encounter (HOSPITAL_COMMUNITY)
Admission: RE | Admit: 2012-08-01 | Discharge: 2012-08-01 | Disposition: A | Discharge status: Home / Self Care | Payer: Medicare PPO | Source: Ambulatory Visit | Attending: Nephrology | Admitting: Nephrology

## 2012-08-01 DIAGNOSIS — D649 Anemia, unspecified: Secondary | ICD-10-CM | POA: Insufficient documentation

## 2012-08-01 DIAGNOSIS — N184 Chronic kidney disease, stage 4 (severe): Secondary | ICD-10-CM | POA: Insufficient documentation

## 2012-08-01 LAB — POCT HEMOGLOBIN-HEMACUE: Hemoglobin: 12 g/dL (ref 12.0–15.0)

## 2012-08-01 LAB — IRON AND TIBC: Saturation Ratios: 23 % (ref 20–55)

## 2012-08-01 LAB — FERRITIN: Ferritin: 300 ng/mL — ABNORMAL HIGH (ref 10–291)

## 2012-08-01 MED ORDER — EPOETIN ALFA 20000 UNIT/ML IJ SOLN
INTRAMUSCULAR | Status: AC
  Filled 2012-08-01: qty 1

## 2012-08-01 MED ORDER — EPOETIN ALFA 10000 UNIT/ML IJ SOLN: 20000.0000 [IU] | INTRAMUSCULAR | Status: DC

## 2012-08-08 ENCOUNTER — Other Ambulatory Visit: Payer: Self-pay | Admitting: Cardiology

## 2012-08-08 NOTE — Telephone Encounter (Signed)
..   Requested Prescriptions   Pending Prescriptions Disp Refills  . atorvastatin (LIPITOR) 80 MG tablet [Pharmacy Med Name: ATORVASTATIN CALCIUM 80MG  TAB] 30 tablet     Sig: TAKE ONE (1) TABLET EACH DAY

## 2012-08-15 ENCOUNTER — Encounter (HOSPITAL_COMMUNITY)
Admission: RE | Admit: 2012-08-15 | Discharge: 2012-08-15 | Disposition: A | Discharge status: Home / Self Care | Payer: Medicare PPO | Source: Ambulatory Visit | Attending: Nephrology | Admitting: Nephrology

## 2012-08-15 MED ORDER — EPOETIN ALFA 20000 UNIT/ML IJ SOLN
INTRAMUSCULAR | Status: AC
  Administered 2012-08-15: 20000 [IU] via SUBCUTANEOUS
  Filled 2012-08-15: qty 1

## 2012-08-15 MED ORDER — EPOETIN ALFA 10000 UNIT/ML IJ SOLN
20000.0000 [IU] | INTRAMUSCULAR | Status: DC
Start: 2012-08-15 — End: 2012-08-16

## 2012-09-05 ENCOUNTER — Encounter (HOSPITAL_COMMUNITY): Payer: Medicare PPO

## 2012-09-05 ENCOUNTER — Encounter (HOSPITAL_COMMUNITY)
Admission: RE | Admit: 2012-09-05 | Discharge: 2012-09-05 | Disposition: A | Discharge status: Home / Self Care | Payer: Medicare PPO | Source: Ambulatory Visit | Attending: Nephrology | Admitting: Nephrology

## 2012-09-05 DIAGNOSIS — D649 Anemia, unspecified: Secondary | ICD-10-CM | POA: Insufficient documentation

## 2012-09-05 DIAGNOSIS — N184 Chronic kidney disease, stage 4 (severe): Secondary | ICD-10-CM | POA: Insufficient documentation

## 2012-09-05 MED ORDER — EPOETIN ALFA 20000 UNIT/ML IJ SOLN
INTRAMUSCULAR | Status: AC
  Administered 2012-09-05: 20000 [IU] via SUBCUTANEOUS
  Filled 2012-09-05: qty 1

## 2012-09-05 MED ORDER — EPOETIN ALFA 10000 UNIT/ML IJ SOLN: 20000.0000 [IU] | INTRAMUSCULAR | Status: DC

## 2012-09-07 ENCOUNTER — Ambulatory Visit (INDEPENDENT_AMBULATORY_CARE_PROVIDER_SITE_OTHER): Payer: Medicare PPO | Admitting: Internal Medicine

## 2012-09-07 ENCOUNTER — Other Ambulatory Visit (INDEPENDENT_AMBULATORY_CARE_PROVIDER_SITE_OTHER): Payer: Medicare PPO

## 2012-09-07 ENCOUNTER — Encounter: Payer: Self-pay | Admitting: Internal Medicine

## 2012-09-07 VITALS — BP 122/60 | HR 78 | Temp 97.8°F | Ht 61.0 in | Wt 194.0 lb

## 2012-09-07 DIAGNOSIS — M79672 Pain in left foot: Secondary | ICD-10-CM

## 2012-09-07 DIAGNOSIS — E119 Type 2 diabetes mellitus without complications: Secondary | ICD-10-CM

## 2012-09-07 DIAGNOSIS — M79671 Pain in right foot: Secondary | ICD-10-CM | POA: Insufficient documentation

## 2012-09-07 DIAGNOSIS — I10 Essential (primary) hypertension: Secondary | ICD-10-CM

## 2012-09-07 DIAGNOSIS — Z0000 Encounter for general adult medical examination without abnormal findings: Secondary | ICD-10-CM

## 2012-09-07 DIAGNOSIS — E785 Hyperlipidemia, unspecified: Secondary | ICD-10-CM

## 2012-09-07 DIAGNOSIS — Z79899 Other long term (current) drug therapy: Secondary | ICD-10-CM

## 2012-09-07 LAB — LIPID PANEL
Cholesterol: 162 mg/dL (ref 0–200)
Triglycerides: 219 mg/dL — ABNORMAL HIGH (ref 0.0–149.0)

## 2012-09-07 LAB — TSH: TSH: 1.89 u[IU]/mL (ref 0.35–5.50)

## 2012-09-07 LAB — HEPATIC FUNCTION PANEL
ALT: 15 U/L (ref 0–35)
AST: 15 U/L (ref 0–37)
Total Bilirubin: 0.5 mg/dL (ref 0.3–1.2)

## 2012-09-07 MED ORDER — FUROSEMIDE 80 MG PO TABS: 80.0000 mg | ORAL_TABLET | Freq: Two times a day (BID) | ORAL | Status: DC

## 2012-09-07 NOTE — Assessment & Plan Note (Signed)
stable overall by hx and exam, most recent data reviewed with pt, and pt to continue medical treatment as before Lab Results  Component Value Date   HGBA1C 6.3 04/07/2011   For f/u a1c today

## 2012-09-07 NOTE — Patient Instructions (Addendum)
Continue all other medications as before Please have the pharmacy call with any other refills you may need. You will be contacted regarding the referral for: podiatry Please go to LAB in the Basement for the blood and/or urine tests to be done today -  You will be contacted by phone if any changes need to be made immediately.  Otherwise, you will receive a letter about your results with an explanation, but please check with MyChart first. Thank you for enrolling in [facility]. Please follow the instructions below to securely access your online medical record. MyChart allows you to send messages to your doctor, view your test results, renew your prescriptions, schedule appointments, and more. To Log into MyChart, please go to [email or web address], and your Username is: [email or web address] (same as your password) Please keep your appointments with your specialists as you have planned - renal Please return in 6 months, or sooner if needed

## 2012-09-07 NOTE — Addendum Note (Signed)
Addended by: [name] on: 09/07/2012 10:10 AM   Modules accepted: Orders

## 2012-09-07 NOTE — Assessment & Plan Note (Signed)
stable overall by hx and exam, most recent data reviewed with pt, and pt to continue medical treatment as before BP Readings from Last 3 Encounters:  09/07/12 122/60  08/15/12 159/77  08/01/12 149/67

## 2012-09-07 NOTE — Assessment & Plan Note (Signed)
Exam most c/w bilat plantar fasciitis, to podiatry for eval, has hx of dm and not seen recently as well

## 2012-09-07 NOTE — Assessment & Plan Note (Signed)

## 2012-09-07 NOTE — Progress Notes (Signed)
Subjective:    Patient ID: [name], female    DOB: 11-18-1945, 66 y.o.   MRN: [id number]  HPI Here for wellness and f/u;  Overall doing ok;  Pt denies CP, worsening SOB, DOE, wheezing, orthopnea, PND, worsening LE edema, palpitations, dizziness or syncope.  Pt denies neurological change such as new Headache, facial or extremity weakness.  Pt denies polydipsia, polyuria, or low sugar symptoms. Pt states overall good compliance with treatment and medications, good tolerability, and trying to follow lower cholesterol diet, though has been somewhat noncompliant here recently.  Pt denies worsening depressive symptoms, suicidal ideation or panic. No fever, wt loss, night sweats, loss of appetite, or other constitutional symptoms.  Pt states good ability with ADL's, low fall risk, home safety reviewed and adequate, no significant changes in hearing or vision, and occasionally active with exercise.  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   C/o bilat feet pain for 2-3 mo, similar pain bilat, heel to toe plantar aspect, worse to instep, worse in the AM to first get up, sometimes can even wake her up at night though. ABI's normal to mild decreased only sept 2012.  Pt continues to have recurring LBP without change in severity, bowel or bladder change, fever, wt loss,  worsening LE pain/numbness/weakness, gait change or falls, usual for her is for onset of pain to stand, lasts for few seconds, then can walk ok.  No clear radicular pain.  No prior known neuropathy, no prior emg/ncs,has hx of DM, no recent podiatry eval.  Has had several labs recent with renal to f/u anemia and renal fxn Past Medical History  Diagnosis Date  . Glaucoma(365)   . Mitral regurgitation   . CAD (coronary artery disease)   . DM2 (diabetes mellitus, type 2)   . GERD (gastroesophageal reflux disease)   . HLD (hyperlipidemia)   . HTN (hypertension)   . PUD (peptic ulcer disease)     . Anemia   . Carotid stenosis   . Osteoporosis   . PVD (peripheral vascular disease)   . Anemia, iron deficiency    Past Surgical History  Procedure Date  . Appendectomy   . Hysterectomy - unknown type   . Oophorectomy     reports that she quit smoking about 4 years ago. She does not have any smokeless tobacco history on file. She reports that she does not drink alcohol. Her drug history not on file. family history includes Heart disease in an unspecified family member; Muscular dystrophy in an unspecified family member; and Stroke in an unspecified family member. No Known Allergies Current Outpatient Prescriptions on File Prior to Visit  Medication Sig Dispense Refill  . ACTONEL 150 MG tablet TAKE 1 TABLET BY MOUTH ONCE A MONTH-TAKEON THE SAME DAY OF EACH MONTH.  1 tablet  8  . amLODipine (NORVASC) 10 MG tablet TAKE ONE (1) TABLET EACH DAY                                                  GENERIC FOR NORVASC  30 tablet  11  . aspirin 325 MG tablet Take 325 mg by mouth daily.        Marland Kitchen atorvastatin (LIPITOR) 80 MG tablet TAKE ONE (1) TABLET EACH DAY  30 tablet  6  . clopidogrel (  PLAVIX) 75 MG tablet TAKE ONE (1) TABLET EACH DAY  30 tablet  11  . ferrous sulfate 325 (65 FE) MG tablet Take 325 mg by mouth 2 (two) times daily.        . furosemide (LASIX) 80 MG tablet Take 40 mg by mouth 2 (two) times daily.       Marland Kitchen glimepiride (AMARYL) 4 MG tablet Take 1 tablet (4 mg total) by mouth daily before breakfast.  180 tablet  3  . JANUVIA 100 MG tablet TAKE 1/2 TO 1 TABLET BY MOUTH PER DAY   FOR SUGAR GREATER THAN 200  30 tablet  8  . metoprolol (LOPRESSOR) 100 MG tablet TAKE 1 TABLET 2 TIMES A DAY                                                  GENERIC FOR LOPRESSO  60 tablet  5  . NITROSTAT 0.4 MG SL tablet USE AS DIRECTED  25 tablet  4  . omeprazole (PRILOSEC) 20 MG capsule TAKE ONE (1) CAPSULE EACH DAY  30 capsule  11  . pioglitazone (ACTOS) 45 MG tablet TAKE ONE TABLET DAILY  30 tablet  10   . timolol (BETIMOL) 0.5 % ophthalmic solution Place 1 drop into both eyes daily.        Marland Kitchen ZETIA 10 MG tablet TAKE ONE (1) TABLET EACH DAY  90 tablet  1  . benazepril (LOTENSIN) 40 MG tablet Take 40 mg by mouth daily. On hold for procedure       . benazepril (LOTENSIN) 40 MG tablet TAKE ONE (1) TABLET EACH DAY  90 tablet  3  . predniSONE (DELTASONE) 10 MG tablet 3 tabs by mouth per day for 3 days,2tabs per day for 3 days,1tab per day for 3 days  18 tablet  0   Review of Systems Review of Systems  Constitutional: Negative for diaphoresis, activity change, appetite change and unexpected weight change.  HENT: Negative for hearing loss, ear pain, facial swelling, mouth sores and neck stiffness.   Eyes: Negative for pain, redness and visual disturbance.  Respiratory: Negative for shortness of breath and wheezing.   Cardiovascular: Negative for chest pain and palpitations.  Gastrointestinal: Negative for diarrhea, blood in stool, abdominal distention and rectal pain.  Genitourinary: Negative for hematuria, flank pain and decreased urine volume.  Musculoskeletal: Negative for myalgias and joint swelling.  Skin: Negative for color change and wound.  Neurological: Negative for syncope and numbness.  Hematological: Negative for adenopathy.  Psychiatric/Behavioral: Negative for hallucinations, self-injury, decreased concentration and agitation.      Objective:   Physical Exam ,BP 122/60  Pulse 78  Temp 97.8 F (36.6 C) (Oral)  Ht 5\' 1"  (1.549 m)  Wt 194 lb (87.998 kg)  BMI 36.66 kg/m2  SpO2 97% Physical Exam  VS noted Constitutional: Pt is oriented to person, place, and time. Appears well-developed and well-nourished. [name] Head: Normocephalic and atraumatic.  Right Ear: External ear normal.  Left Ear: External ear normal.  Nose: Nose normal.  Mouth/Throat: Oropharynx is clear and moist.  Eyes: Conjunctivae and EOM are normal. Pupils are equal, round, and reactive to light.  Neck:  Normal range of motion. Neck supple. No JVD present. No tracheal deviation present.  Cardiovascular: Normal rate, regular rhythm, normal heart sounds and intact distal pulses.   Pulmonary/Chest:  Effort normal and breath sounds normal.  Abdominal: Soft. Bowel sounds are normal. There is no tenderness.  Musculoskeletal: Normal range of motion. Exhibits no edema.  Lymphadenopathy:  Has no cervical adenopathy.  Neurological: Pt is alert and oriented to person, place, and time. Pt has normal reflexes. No cranial nerve deficit. Except for chronic vision loss Skin: Skin is warm and dry. No rash noted. No LE edema Psychiatric:  Has  normal mood and affect. Behavior is normal.     Assessment & Plan:

## 2012-09-26 ENCOUNTER — Encounter (HOSPITAL_COMMUNITY)
Admission: RE | Admit: 2012-09-26 | Discharge: 2012-09-26 | Disposition: A | Discharge status: Home / Self Care | Payer: Medicare PPO | Source: Ambulatory Visit | Attending: Nephrology | Admitting: Nephrology

## 2012-09-26 DIAGNOSIS — N184 Chronic kidney disease, stage 4 (severe): Secondary | ICD-10-CM | POA: Insufficient documentation

## 2012-09-26 DIAGNOSIS — D649 Anemia, unspecified: Secondary | ICD-10-CM | POA: Insufficient documentation

## 2012-09-26 MED ORDER — EPOETIN ALFA 20000 UNIT/ML IJ SOLN
INTRAMUSCULAR | Status: AC
  Administered 2012-09-26: 20000 [IU]
  Filled 2012-09-26: qty 1

## 2012-09-26 MED ORDER — EPOETIN ALFA 10000 UNIT/ML IJ SOLN: 20000.0000 [IU] | INTRAMUSCULAR | Status: DC

## 2012-10-15 ENCOUNTER — Other Ambulatory Visit (HOSPITAL_COMMUNITY): Payer: Self-pay | Admitting: *Deleted

## 2012-10-16 ENCOUNTER — Encounter (HOSPITAL_COMMUNITY)
Admission: RE | Admit: 2012-10-16 | Discharge: 2012-10-16 | Disposition: A | Discharge status: Home / Self Care | Payer: Medicare PPO | Source: Ambulatory Visit | Attending: Nephrology | Admitting: Nephrology

## 2012-10-16 LAB — POCT HEMOGLOBIN-HEMACUE: Hemoglobin: 12.4 g/dL (ref 12.0–15.0)

## 2012-10-16 MED ORDER — EPOETIN ALFA 10000 UNIT/ML IJ SOLN: 20000.0000 [IU] | INTRAMUSCULAR | Status: DC

## 2012-10-22 ENCOUNTER — Ambulatory Visit: Payer: Medicare PPO | Admitting: Cardiovascular Disease

## 2012-10-26 ENCOUNTER — Encounter: Payer: Self-pay | Admitting: Cardiovascular Disease

## 2012-10-26 ENCOUNTER — Ambulatory Visit (INDEPENDENT_AMBULATORY_CARE_PROVIDER_SITE_OTHER): Payer: Medicare PPO | Admitting: Cardiovascular Disease

## 2012-10-26 VITALS — BP 134/70 | HR 65 | Ht 61.0 in | Wt 190.0 lb

## 2012-10-26 DIAGNOSIS — I701 Atherosclerosis of renal artery: Secondary | ICD-10-CM

## 2012-10-26 DIAGNOSIS — I2510 Atherosclerotic heart disease of native coronary artery without angina pectoris: Secondary | ICD-10-CM

## 2012-10-26 NOTE — Patient Instructions (Addendum)
Your physician has requested that you have a carotid duplex in June 2014. This test is an ultrasound of the carotid arteries in your neck. It looks at blood flow through these arteries that supply the brain with blood. Allow one hour for this exam. There are no restrictions or special instructions.  Your physician wants you to follow-up in: 1 YEAR with Dr Burt Knack.  You will receive a reminder letter in the mail two months in advance. If you don't receive a letter, please call our office to schedule the follow-up appointment.  Your physician recommends that you continue on your current medications as directed. Please refer to the Current Medication list given to you today.

## 2012-10-26 NOTE — Progress Notes (Signed)
HPI:  67 year old woman presenting for followup evaluation. The patient has renal artery stenosis and chronic kidney disease. 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 She also has coronary artery disease and underwent stenting of the left circumflex in 2004.  She reports stable symptoms over the past 12 months. She has dyspnea with moderate activity. She also has chronic orthopnea. She denies leg swelling or PND. She's had no chest pain or pressure. She's followed closely by Dr. Mercy Moore for management of her chronic kidney disease. She receives Procrit every month. She had her annual wellness evaluation with Dr. [name] about 2 months ago and that note was reviewed.  Outpatient Encounter Prescriptions as of 10/26/2012  Medication Sig Dispense Refill  . amLODipine (NORVASC) 10 MG tablet TAKE ONE (1) TABLET EACH DAY                                                  GENERIC FOR NORVASC  30 tablet  11  . aspirin 325 MG tablet Take 325 mg by mouth daily.        Marland Kitchen atorvastatin (LIPITOR) 80 MG tablet TAKE ONE (1) TABLET EACH DAY  30 tablet  6  . calcitRIOL (ROCALTROL) 0.25 MCG capsule Take 0.25 mcg by mouth daily.      . clopidogrel (PLAVIX) 75 MG tablet TAKE ONE (1) TABLET EACH DAY  30 tablet  11  . ferrous sulfate 325 (65 FE) MG tablet Take 325 mg by mouth 2 (two) times daily.        . furosemide (LASIX) 80 MG tablet Take 1 tablet (80 mg total) by mouth 2 (two) times daily.  180 tablet  3  . glimepiride (AMARYL) 4 MG tablet Take 1 tablet (4 mg total) by mouth daily before breakfast.  180 tablet  3  . JANUVIA 100 MG tablet TAKE 1/2 TO 1 TABLET BY MOUTH PER DAY   FOR SUGAR GREATER THAN 200  30 tablet  8  . metoprolol (LOPRESSOR) 100 MG tablet TAKE 1 TABLET 2 TIMES A DAY                                                  GENERIC FOR LOPRESSO  60 tablet  5  . NITROSTAT 0.4 MG SL tablet USE AS DIRECTED  25 tablet  4  . omeprazole (PRILOSEC) 20 MG capsule TAKE ONE (1)  CAPSULE EACH DAY  30 capsule  11  . pioglitazone (ACTOS) 45 MG tablet TAKE ONE TABLET DAILY  30 tablet  10  . risedronate (ACTONEL) 150 MG tablet Take 150 mg by mouth every 30 (thirty) days. with water on empty stomach, nothing by mouth or lie down for next 30 minutes.      . timolol (BETIMOL) 0.5 % ophthalmic solution Place 1 drop into both eyes daily.        . vitamin B-12 (CYANOCOBALAMIN) 1000 MCG tablet Take 1,000 mcg by mouth daily.      Marland Kitchen ZETIA 10 MG tablet TAKE ONE (1) TABLET EACH DAY  90 tablet  1  . [DISCONTINUED] traMADol (ULTRAM) 50 MG tablet Take 50 mg by mouth every 6 (six) hours as needed.  No Known Allergies  Past Medical History  Diagnosis Date  . Glaucoma(365)   . Mitral regurgitation   . CAD (coronary artery disease)   . DM2 (diabetes mellitus, type 2)   . GERD (gastroesophageal reflux disease)   . HLD (hyperlipidemia)   . HTN (hypertension)   . PUD (peptic ulcer disease)   . Anemia   . Carotid stenosis   . Osteoporosis   . PVD (peripheral vascular disease)   . Anemia, iron deficiency     ROS: Negative except as per HPI  BP 134/70  Pulse 65  Ht 5\' 1"  (1.549 m)  Wt 86.183 kg (190 lb)  BMI 35.90 kg/m2  SpO2 98%  PHYSICAL EXAM: Pt is alert and oriented, pleasant elderly woman in NAD HEENT: normal Neck: JVP - normal, carotids 2+= with bilateral bruits Lungs: CTA bilaterally CV: RRR without murmur or gallop Abd: soft, NT, Positive BS Ext: no C/C/E Skin: warm/dry no rash  EKG:  Normal sinus rhythm 65 beats per minute, age-indeterminate septal infarct, nonspecific ST abnormality peer  ASSESSMENT AND PLAN: 1. Coronary atherosclerosis, native vessel. Patient is status post remote PCI. She's having no anginal symptoms. She is maintained on dual antiplatelet therapy with aspirin and Plavix. Will continue her same medical program.  2. Hypertension. Blood pressure is well controlled on a combination of amlodipine, furosemide, and metoprolol. Not on ACE  inhibitor or ARB because of advanced chronic kidney disease and renal atherosclerosis.  3. 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 Medical management.  4. Bilateral carotid artery stenoses. Patient with 40-59% carotid stenosis. She is due for followup surveillance ultrasound June 2014.  For followup I will see her back in one year.  Sherren Mocha 10/26/2012 11:31 AM

## 2012-10-29 ENCOUNTER — Other Ambulatory Visit: Payer: Self-pay | Admitting: Internal Medicine

## 2012-10-30 ENCOUNTER — Encounter (HOSPITAL_COMMUNITY)
Admission: RE | Admit: 2012-10-30 | Discharge: 2012-10-30 | Disposition: A | Discharge status: Home / Self Care | Payer: Medicare PPO | Source: Ambulatory Visit | Attending: Nephrology | Admitting: Nephrology

## 2012-10-30 DIAGNOSIS — D649 Anemia, unspecified: Secondary | ICD-10-CM | POA: Insufficient documentation

## 2012-10-30 DIAGNOSIS — N184 Chronic kidney disease, stage 4 (severe): Secondary | ICD-10-CM | POA: Insufficient documentation

## 2012-10-30 LAB — POCT HEMOGLOBIN-HEMACUE: Hemoglobin: 11.1 g/dL — ABNORMAL LOW (ref 12.0–15.0)

## 2012-10-30 LAB — IRON AND TIBC
Saturation Ratios: 21 % (ref 20–55)
TIBC: 295 ug/dL (ref 250–470)
UIBC: 234 ug/dL (ref 125–400)

## 2012-10-30 MED ORDER — EPOETIN ALFA 10000 UNIT/ML IJ SOLN: 20000.0000 [IU] | INTRAMUSCULAR | Status: DC

## 2012-10-30 MED ORDER — EPOETIN ALFA 20000 UNIT/ML IJ SOLN
INTRAMUSCULAR | Status: AC
  Administered 2012-10-30: 20000 [IU] via SUBCUTANEOUS
  Filled 2012-10-30: qty 1

## 2012-11-12 ENCOUNTER — Telehealth: Payer: Self-pay | Admitting: Internal Medicine

## 2012-11-12 NOTE — Telephone Encounter (Signed)
Received 4 pages from [facility], sent to Dr. [name]. 11/12/12/sd

## 2012-11-20 ENCOUNTER — Other Ambulatory Visit: Payer: Self-pay | Admitting: Internal Medicine

## 2012-11-20 ENCOUNTER — Encounter (HOSPITAL_COMMUNITY)
Admission: RE | Admit: 2012-11-20 | Discharge: 2012-11-20 | Disposition: A | Discharge status: Home / Self Care | Payer: Medicare PPO | Source: Ambulatory Visit | Attending: Nephrology | Admitting: Nephrology

## 2012-11-20 LAB — POCT HEMOGLOBIN-HEMACUE: Hemoglobin: 10.9 g/dL — ABNORMAL LOW (ref 12.0–15.0)

## 2012-11-20 MED ORDER — EPOETIN ALFA 10000 UNIT/ML IJ SOLN: 20000.0000 [IU] | INTRAMUSCULAR | Status: DC

## 2012-11-20 MED ORDER — EPOETIN ALFA 20000 UNIT/ML IJ SOLN
INTRAMUSCULAR | Status: AC
  Administered 2012-11-20: 20000 [IU] via SUBCUTANEOUS
  Filled 2012-11-20: qty 1

## 2012-11-26 ENCOUNTER — Other Ambulatory Visit: Payer: Self-pay | Admitting: Internal Medicine

## 2012-12-11 ENCOUNTER — Encounter (HOSPITAL_COMMUNITY)
Admission: RE | Admit: 2012-12-11 | Discharge: 2012-12-11 | Disposition: A | Discharge status: Home / Self Care | Payer: Medicare PPO | Source: Ambulatory Visit | Attending: Nephrology | Admitting: Nephrology

## 2012-12-11 DIAGNOSIS — D649 Anemia, unspecified: Secondary | ICD-10-CM | POA: Insufficient documentation

## 2012-12-11 DIAGNOSIS — N184 Chronic kidney disease, stage 4 (severe): Secondary | ICD-10-CM | POA: Insufficient documentation

## 2012-12-11 MED ORDER — EPOETIN ALFA 20000 UNIT/ML IJ SOLN
INTRAMUSCULAR | Status: AC
  Administered 2012-12-11: 20000 [IU] via SUBCUTANEOUS
  Filled 2012-12-11: qty 1

## 2012-12-11 MED ORDER — EPOETIN ALFA 10000 UNIT/ML IJ SOLN: 20000.0000 [IU] | INTRAMUSCULAR | Status: DC

## 2012-12-13 ENCOUNTER — Ambulatory Visit: Payer: Medicare PPO | Admitting: Internal Medicine

## 2012-12-14 ENCOUNTER — Encounter: Payer: Self-pay | Admitting: Internal Medicine

## 2012-12-14 ENCOUNTER — Ambulatory Visit (INDEPENDENT_AMBULATORY_CARE_PROVIDER_SITE_OTHER): Payer: Medicare PPO | Admitting: Internal Medicine

## 2012-12-14 VITALS — BP 132/60 | HR 73 | Temp 98.1°F | Ht 61.0 in | Wt 195.4 lb

## 2012-12-14 DIAGNOSIS — E119 Type 2 diabetes mellitus without complications: Secondary | ICD-10-CM

## 2012-12-14 DIAGNOSIS — R21 Rash and other nonspecific skin eruption: Secondary | ICD-10-CM

## 2012-12-14 DIAGNOSIS — I10 Essential (primary) hypertension: Secondary | ICD-10-CM

## 2012-12-14 MED ORDER — HYDROXYZINE HCL 25 MG PO TABS: ORAL_TABLET | ORAL | Status: DC

## 2012-12-14 MED ORDER — PREDNISONE 10 MG PO TABS: ORAL_TABLET | ORAL | Status: DC

## 2012-12-14 MED ORDER — METHYLPREDNISOLONE ACETATE 80 MG/ML IJ SUSP
80.0000 mg | Freq: Once | INTRAMUSCULAR | Status: AC
  Administered 2012-12-14: 80 mg via INTRAMUSCULAR

## 2012-12-14 NOTE — Patient Instructions (Signed)
You had the steroid shot today Please take all new medication as prescribed - the prednisone, and hydroxyzine for itching if needed Please continue all other medications as before Please call if your blood sugars are consistently more than 200 on the prednisone, but remember it should improve when the prednisone is stopped You can also use Benadryl Cream OTC for the worst itching areas as well Please call if the rash tends to come back for dermatology referral

## 2012-12-14 NOTE — Progress Notes (Signed)
Subjective:    Patient ID: [name], female    DOB: [birth year], 67 y.o.   MRN: [id number]  HPI  Here with 4-5 days acute onset rash she notes mostly to the back and left arm but actually diffuse torso and somewhat more prox extremities, with marked pruritis and excoriations especially to the back where she has been active at scratching.  No recent change in meds, otcs,contact exposures, hx of similar rash, fever, swelling of any kind such as tongue or lips, wheezing or sob.  Has not tried any otc such as benadryl.  Has hx of CKD but no known liver dz. Past Medical History  Diagnosis Date  . Glaucoma(365)   . Mitral regurgitation   . CAD (coronary artery disease)   . DM2 (diabetes mellitus, type 2)   . GERD (gastroesophageal reflux disease)   . HLD (hyperlipidemia)   . HTN (hypertension)   . PUD (peptic ulcer disease)   . Anemia   . Carotid stenosis   . Osteoporosis   . PVD (peripheral vascular disease)   . Anemia, iron deficiency    Past Surgical History  Procedure Laterality Date  . Appendectomy    . Hysterectomy - unknown type    . Oophorectomy      reports that she quit smoking about 5 years ago. She does not have any smokeless tobacco history on file. She reports that she does not drink alcohol. Her drug history is not on file. family history includes Heart disease in an unspecified family member; Muscular dystrophy in an unspecified family member; and Stroke in an unspecified family member. No Known Allergies Current Outpatient Prescriptions on File Prior to Visit  Medication Sig Dispense Refill  . ACTONEL 150 MG tablet TAKE 1 TABLET BY MOUTH ONCE A MONTH-TAKEON THE SAME DAY OF EACH MONTH.  1 each  8  . amLODipine (NORVASC) 10 MG tablet TAKE ONE (1) TABLET EACH DAY                                                  GENERIC FOR NORVASC  30 tablet  11  . aspirin 325 MG tablet Take 325 mg by mouth daily.        Marland Kitchen atorvastatin (LIPITOR) 80 MG tablet TAKE ONE (1) TABLET EACH  DAY  30 tablet  6  . calcitRIOL (ROCALTROL) 0.25 MCG capsule Take 0.25 mcg by mouth daily.      . clopidogrel (PLAVIX) 75 MG tablet TAKE ONE (1) TABLET EACH DAY  30 tablet  11  . ferrous sulfate 325 (65 FE) MG tablet Take 325 mg by mouth 2 (two) times daily.        . furosemide (LASIX) 80 MG tablet Take 1 tablet (80 mg total) by mouth 2 (two) times daily.  180 tablet  3  . glimepiride (AMARYL) 4 MG tablet Take 1 tablet (4 mg total) by mouth daily before breakfast.  180 tablet  3  . JANUVIA 100 MG tablet TAKE 1/2 TO 1 TABLET BY MOUTH PER DAY   FOR SUGAR GREATER THAN 200  30 tablet  8  . metoprolol (LOPRESSOR) 100 MG tablet TAKE 1 TABLET 2 TIMES A DAY  60 tablet  5  . NITROSTAT 0.4 MG SL tablet USE AS DIRECTED  25 tablet  4  . omeprazole (PRILOSEC) 20 MG capsule TAKE  ONE (1) CAPSULE EACH DAY  30 capsule  9  . pioglitazone (ACTOS) 45 MG tablet TAKE ONE TABLET DAILY  30 tablet  10  . timolol (BETIMOL) 0.5 % ophthalmic solution Place 1 drop into both eyes daily.        . vitamin B-12 (CYANOCOBALAMIN) 1000 MCG tablet Take 1,000 mcg by mouth daily.      Marland Kitchen ZETIA 10 MG tablet TAKE ONE (1) TABLET EACH DAY  90 tablet  1   No current facility-administered medications on file prior to visit.   Review of Systems  Constitutional: Negative for unexpected weight change, or unusual diaphoresis  HENT: Negative for tinnitus.   Eyes: Negative for photophobia and visual disturbance.  Respiratory: Negative for choking and stridor.   Gastrointestinal: Negative for vomiting and blood in stool.  Genitourinary: Negative for hematuria and decreased urine volume.  Musculoskeletal: Negative for acute joint swelling Skin: Negative for color change and wound.  Neurological: Negative for tremors and numbness other than noted  Psychiatric/Behavioral: Negative for decreased concentration or  hyperactivity.       Objective:   Physical Exam BP 132/60  Pulse 73  Temp(Src) 98.1 F (36.7 C) (Oral)  Ht 5\' 1"  (1.549 m)   Wt 195 lb 6 oz (88.622 kg)  BMI 36.94 kg/m2  SpO2 97% VS noted,  Constitutional: Pt appears well-developed and well-nourished.  HENT: Head: NCAT.  Right Ear: External ear normal.  Left Ear: External ear normal.  Eyes: Conjunctivae and EOM are normal. Pupils are equal, round, and reactive to light.  Neck: Normal range of motion. Neck supple.  Cardiovascular: Normal rate and regular rhythm.   Pulmonary/Chest: Effort normal and breath sounds normal.  Neurological: Pt is alert. Not confused  Skin: with diffuse nontender macular rash with TNTC excoriations and to back and especially left upper extremity that she can get to, with several small very superfical ulceration related to the excoriations, no active cellultiis, vesicles/bulla or hemorrhage Psychiatric: Pt behavior is normal. Thought content normal.     Assessment & Plan:

## 2012-12-15 NOTE — Assessment & Plan Note (Signed)
stable overall by history and exam, recent data reviewed with pt, and pt to continue medical treatment as before,  to f/u any worsening symptoms or concerns BP Readings from Last 3 Encounters:  12/14/12 132/60  12/11/12 143/67  11/20/12 148/83

## 2012-12-15 NOTE — Assessment & Plan Note (Signed)
stable overall by history and exam, recent data reviewed with pt, and pt to continue medical treatment as before,  to f/u any worsening symptoms or concerns Lab Results  Component Value Date   HGBA1C 7.0* 09/07/2012   Pt to monitor cbg's on steroid tx, to call for > 200

## 2012-12-15 NOTE — Assessment & Plan Note (Addendum)
C/w hypersensitive or allergic in nature, for depomedrol IM, and predpack asd,  to f/u any worsening symptoms or concerns, to cont same meds, for benadryl cream topical prn

## 2013-01-01 ENCOUNTER — Encounter (HOSPITAL_COMMUNITY)
Admission: RE | Admit: 2013-01-01 | Discharge: 2013-01-01 | Disposition: A | Discharge status: Home / Self Care | Payer: Medicare PPO | Source: Ambulatory Visit | Attending: Nephrology | Admitting: Nephrology

## 2013-01-01 DIAGNOSIS — D649 Anemia, unspecified: Secondary | ICD-10-CM | POA: Insufficient documentation

## 2013-01-01 DIAGNOSIS — N184 Chronic kidney disease, stage 4 (severe): Secondary | ICD-10-CM | POA: Insufficient documentation

## 2013-01-01 MED ORDER — EPOETIN ALFA 10000 UNIT/ML IJ SOLN
20000.0000 [IU] | INTRAMUSCULAR | Status: DC
  Administered 2013-01-01: 20000 [IU] via SUBCUTANEOUS

## 2013-01-01 MED ORDER — EPOETIN ALFA 20000 UNIT/ML IJ SOLN
INTRAMUSCULAR | Status: AC
  Filled 2013-01-01: qty 1

## 2013-01-02 ENCOUNTER — Telehealth: Payer: Self-pay | Admitting: Internal Medicine

## 2013-01-02 MED FILL — Epoetin Alfa Inj 20000 Unit/ML: INTRAMUSCULAR | Qty: 1 | Status: AC

## 2013-01-02 NOTE — Telephone Encounter (Signed)
The patient was seen by her kidney MD yesterday and her BS was over 500.  The kidney MD wanted her to go to the hospital, but she declined.  The patient has been checking her blood sugar last night was 221, 211 and 234.  Today she checked after lunch was 305, 321.  Please advise

## 2013-01-02 NOTE — Telephone Encounter (Signed)
Needs OV, with me or regina

## 2013-01-02 NOTE — Telephone Encounter (Signed)
The patient called the triage line and is hoping to discuss her fluctuating blood sugars with the cma.  Her callback number is [phone]

## 2013-01-03 ENCOUNTER — Encounter: Payer: Self-pay | Admitting: Internal Medicine

## 2013-01-03 ENCOUNTER — Ambulatory Visit (INDEPENDENT_AMBULATORY_CARE_PROVIDER_SITE_OTHER): Payer: Medicare PPO | Admitting: Internal Medicine

## 2013-01-03 VITALS — BP 142/88 | HR 65 | Temp 97.1°F | Ht 61.0 in | Wt 192.1 lb

## 2013-01-03 DIAGNOSIS — E119 Type 2 diabetes mellitus without complications: Secondary | ICD-10-CM

## 2013-01-03 DIAGNOSIS — E785 Hyperlipidemia, unspecified: Secondary | ICD-10-CM

## 2013-01-03 DIAGNOSIS — I10 Essential (primary) hypertension: Secondary | ICD-10-CM

## 2013-01-03 MED ORDER — INSULIN PEN NEEDLE 32G X 4 MM MISC: 1.0000 "application " | Freq: Every day | Status: DC

## 2013-01-03 MED ORDER — INSULIN GLARGINE 100 UNIT/ML ~~LOC~~ SOLN: 10.0000 [IU] | Freq: Every day | SUBCUTANEOUS | Status: DC

## 2013-01-03 NOTE — Assessment & Plan Note (Signed)
stable overall by history and exam, recent data reviewed with pt, and pt to continue medical treatment as before,  to f/u any worsening symptoms or concerns Lab Results  Component Value Date   HGBA1C 7.0* 09/07/2012   BP Readings from Last 3 Encounters:  01/03/13 142/88  01/01/13 123/76  12/14/12 132/60

## 2013-01-03 NOTE — Progress Notes (Signed)
Subjective:    Patient ID: [name], female    DOB: 21-Dec-1945, 67 y.o.   MRN: [id number]  HPI  Here to f/u with report of elevated BS over 500 at one point when saw renal MD.  She declined, but has been checking her sugars with ranging about 250-360. Fiinised prednisone about mar 1, AM sugars often still in low 200's after stopped prednisone.  Has been notified she will need dialysis, to see vascular surgeon soon.   Pt denies fever, wt loss, night sweats, loss of appetite, or other constitutional symptoms  Denies urinary symptoms such as dysuria, frequency, urgency, flank pain, hematuria or n/v, fever, chills.  Pt denies chest pain, increased sob or doe, wheezing, orthopnea, PND, increased LE swelling, palpitations, dizziness or syncope. No recent memory issue, seems cognitively sharp today, can recite her recent cbg's with exact numbers over the last few days Past Medical History  Diagnosis Date  . Glaucoma(365)   . Mitral regurgitation   . CAD (coronary artery disease)   . DM2 (diabetes mellitus, type 2)   . GERD (gastroesophageal reflux disease)   . HLD (hyperlipidemia)   . HTN (hypertension)   . PUD (peptic ulcer disease)   . Anemia   . Carotid stenosis   . Osteoporosis   . PVD (peripheral vascular disease)   . Anemia, iron deficiency    Past Surgical History  Procedure Laterality Date  . Appendectomy    . Hysterectomy - unknown type    . Oophorectomy      reports that she quit smoking about 5 years ago. She does not have any smokeless tobacco history on file. She reports that she does not drink alcohol. Her drug history is not on file. family history includes Heart disease in an unspecified family member; Muscular dystrophy in an unspecified family member; and Stroke in an unspecified family member. No Known Allergies Current Outpatient Prescriptions on File Prior to Visit  Medication Sig Dispense Refill  . ACTONEL 150 MG tablet TAKE 1 TABLET BY MOUTH ONCE A  MONTH-TAKEON THE SAME DAY OF EACH MONTH.  1 each  8  . amLODipine (NORVASC) 10 MG tablet TAKE ONE (1) TABLET EACH DAY                                                  GENERIC FOR NORVASC  30 tablet  11  . aspirin 325 MG tablet Take 325 mg by mouth daily.        Marland Kitchen atorvastatin (LIPITOR) 80 MG tablet TAKE ONE (1) TABLET EACH DAY  30 tablet  6  . calcitRIOL (ROCALTROL) 0.25 MCG capsule Take 0.25 mcg by mouth daily.      . clopidogrel (PLAVIX) 75 MG tablet TAKE ONE (1) TABLET EACH DAY  30 tablet  11  . ferrous sulfate 325 (65 FE) MG tablet Take 325 mg by mouth 2 (two) times daily.        . furosemide (LASIX) 80 MG tablet Take 1 tablet (80 mg total) by mouth 2 (two) times daily.  180 tablet  3  . hydrOXYzine (ATARAX/VISTARIL) 25 MG tablet 1-2 tabs by mouth every 8 hrs as needed for itching  60 tablet  0  . JANUVIA 100 MG tablet TAKE 1/2 TO 1 TABLET BY MOUTH PER DAY   FOR SUGAR GREATER THAN 200  30  tablet  8  . metoprolol (LOPRESSOR) 100 MG tablet TAKE 1 TABLET 2 TIMES A DAY  60 tablet  5  . NITROSTAT 0.4 MG SL tablet USE AS DIRECTED  25 tablet  4  . omeprazole (PRILOSEC) 20 MG capsule TAKE ONE (1) CAPSULE EACH DAY  30 capsule  9  . pioglitazone (ACTOS) 45 MG tablet TAKE ONE TABLET DAILY  30 tablet  10  . timolol (BETIMOL) 0.5 % ophthalmic solution Place 1 drop into both eyes daily.        . vitamin B-12 (CYANOCOBALAMIN) 1000 MCG tablet Take 1,000 mcg by mouth daily.      Marland Kitchen ZETIA 10 MG tablet TAKE ONE (1) TABLET EACH DAY  90 tablet  1   No current facility-administered medications on file prior to visit.   Review of Systems  Constitutional: Negative for unexpected weight change, or unusual diaphoresis  HENT: Negative for tinnitus.   Eyes: Negative for photophobia and visual disturbance.  Respiratory: Negative for choking and stridor.   Gastrointestinal: Negative for vomiting and blood in stool.  Genitourinary: Negative for hematuria and decreased urine volume.  Musculoskeletal: Negative for  acute joint swelling Skin: Negative for color change and wound.  Neurological: Negative for tremors and numbness other than noted  Psychiatric/Behavioral: Negative for decreased concentration or  hyperactivity.       Objective:   Physical Exam BP 142/88  Pulse 65  Temp(Src) 97.1 F (36.2 C) (Oral)  Ht 5\' 1"  (1.549 m)  Wt 192 lb 2 oz (87.147 kg)  BMI 36.32 kg/m2  SpO2 97% VS noted, not ill appearing Constitutional: Pt appears well-developed and well-nourished.  HENT: Head: NCAT.  Right Ear: External ear normal.  Left Ear: External ear normal.  Eyes: Conjunctivae and EOM are normal. Pupils are equal, round, and reactive to light.  Neck: Normal range of motion. Neck supple.  Cardiovascular: Normal rate and regular rhythm.   Pulmonary/Chest: Effort normal and breath sounds normal.  Abd:  Soft, NT, non-distended, + BS Neurological: Pt is alert. Not confused , remains with chronic severe reduced vision Skin: Skin is warm. No erythema.  Psychiatric: Pt behavior is normal. Thought content normal.     Assessment & Plan:

## 2013-01-03 NOTE — Assessment & Plan Note (Signed)
Persistent elevated after recent prednisone successful tx, without signficant change in diet/activity per pt, and no acute infectious or other today, overall good med compliance as well.  At this point will d/c glimeparide in favor of starting lantus 10 units today (after 4 unit teaching dose given to pt in the office while here per staff), cont other meds, delcines DM teaching for now

## 2013-01-03 NOTE — Patient Instructions (Addendum)
Please stop the glimeparide 4 mg pills (no need take even later today) You had the Lantus 4 units in the office today, and the instructions on use Please start Lantus 10 units per day, with the first dose tomorrow morning (and every morning after that) Please continue to monitor your blood sugars at least 3-4 times per day (before the meals and before bedtime) and let us know the results by calling or Mychart on Monday March 17 Please continue all other medications as before, and refills have been done if requested. Please have the pharmacy call with any other refills you may need. Please keep your appointments with your specialists as you have planned - vascular surgury and kidney doctors Thank you for enrolling in [facility]. Please follow the instructions below to securely access your online medical record. MyChart allows you to send messages to your doctor, view your test results, renew your prescriptions, schedule appointments, and more. To Log into My Chart online, please go by [employer] or [employer] to [email or web address], or download the MyChart App from the [employer] of [employer].  Your Username is: [email or web address] (same as your password) You already have an appointment for Mar 11, 2013 return visit

## 2013-01-03 NOTE — Assessment & Plan Note (Signed)
stable overall by history and exam, recent data reviewed with pt, and pt to continue medical treatment as before,  to f/u any worsening symptoms or concerns Lab Results  Component Value Date   LDLCALC 90 09/05/2011

## 2013-01-08 ENCOUNTER — Other Ambulatory Visit: Payer: Self-pay

## 2013-01-08 DIAGNOSIS — N186 End stage renal disease: Secondary | ICD-10-CM

## 2013-01-08 DIAGNOSIS — Z01810 Encounter for preprocedural cardiovascular examination: Secondary | ICD-10-CM

## 2013-01-09 ENCOUNTER — Telehealth: Payer: Self-pay | Admitting: Internal Medicine

## 2013-01-09 ENCOUNTER — Encounter: Payer: Self-pay | Admitting: Internal Medicine

## 2013-01-09 NOTE — Telephone Encounter (Signed)
Called the patient informed of MD instructions.  Adjusted medication list.

## 2013-01-09 NOTE — Telephone Encounter (Signed)
Per pt email:  The most recent sugars are in the "ballpark".  Please increase the lantus to 12 units, and check the blood sugar once per day, but at different times if you are able, such as before bfast, lunch, dinner or before bedtime, to get an idea of how the sugars are doing overall.  You should also hear from Shirlean Mylar about this.  Thanks  Shirlean Mylar to inform pt and/or family, and adjust med list

## 2013-01-10 ENCOUNTER — Telehealth: Payer: Self-pay | Admitting: Internal Medicine

## 2013-01-10 NOTE — Telephone Encounter (Signed)
Received 4 pages from [place] sent to Dr. [name]. 01/10/13/ss

## 2013-01-14 ENCOUNTER — Encounter: Payer: Self-pay | Admitting: Vascular Surgery

## 2013-01-15 ENCOUNTER — Encounter (INDEPENDENT_AMBULATORY_CARE_PROVIDER_SITE_OTHER): Payer: Medicare PPO | Admitting: Vascular Surgery

## 2013-01-15 ENCOUNTER — Encounter (HOSPITAL_COMMUNITY): Payer: Self-pay

## 2013-01-15 ENCOUNTER — Encounter (HOSPITAL_COMMUNITY)
Admission: RE | Admit: 2013-01-15 | Discharge: 2013-01-15 | Disposition: A | Discharge status: Home / Self Care | Payer: Medicare PPO | Source: Ambulatory Visit | Attending: Anesthesiology | Admitting: Anesthesiology

## 2013-01-15 ENCOUNTER — Other Ambulatory Visit: Payer: Self-pay

## 2013-01-15 ENCOUNTER — Ambulatory Visit (INDEPENDENT_AMBULATORY_CARE_PROVIDER_SITE_OTHER): Payer: Medicare PPO | Admitting: Vascular Surgery

## 2013-01-15 ENCOUNTER — Encounter: Payer: Self-pay | Admitting: Vascular Surgery

## 2013-01-15 ENCOUNTER — Encounter (HOSPITAL_COMMUNITY)
Admission: RE | Admit: 2013-01-15 | Discharge: 2013-01-15 | Disposition: A | Discharge status: Home / Self Care | Payer: Medicare PPO | Source: Ambulatory Visit | Attending: Vascular Surgery | Admitting: Vascular Surgery

## 2013-01-15 ENCOUNTER — Encounter (HOSPITAL_COMMUNITY): Payer: Self-pay | Admitting: Respiratory Therapy

## 2013-01-15 VITALS — BP 136/75 | HR 65 | Resp 16 | Ht 61.0 in | Wt 197.0 lb

## 2013-01-15 DIAGNOSIS — N186 End stage renal disease: Secondary | ICD-10-CM

## 2013-01-15 DIAGNOSIS — Z01810 Encounter for preprocedural cardiovascular examination: Secondary | ICD-10-CM

## 2013-01-15 DIAGNOSIS — N184 Chronic kidney disease, stage 4 (severe): Secondary | ICD-10-CM

## 2013-01-15 HISTORY — DX: Unspecified osteoarthritis, unspecified site: M19.90

## 2013-01-15 HISTORY — DX: Shortness of breath: R06.02

## 2013-01-15 HISTORY — DX: Chronic kidney disease, unspecified: N18.9

## 2013-01-15 LAB — SURGICAL PCR SCREEN: MRSA, PCR: NEGATIVE

## 2013-01-15 NOTE — Progress Notes (Signed)
Vascular and Vein Specialist of [facility]   Patient name: [name] MRN: [id number] DOB: [birth year] Sex: female   Referred by: Mercy Moore  Reason for referral:  Chief Complaint  Patient presents with  . ESRD    Eval for HD access, ref. by Dr. Mercy Moore    HISTORY OF PRESENT ILLNESS: Patient is a very pleasant 67 year old female with progressive chronic renal insufficiency. She is seeing [place] today for discussion of hemodialysis access. She reports that she has watts V. instructional video and does have some pulmonary understanding of hemodialysis access. She has not been on dialysis to date.  She has a multiple medical problems. She is right-handed.  Past Medical History  Diagnosis Date  . Glaucoma(365)   . Mitral regurgitation   . CAD (coronary artery disease)   . DM2 (diabetes mellitus, type 2)   . GERD (gastroesophageal reflux disease)   . HLD (hyperlipidemia)   . HTN (hypertension)   . PUD (peptic ulcer disease)   . Anemia   . Carotid stenosis   . Osteoporosis   . PVD (peripheral vascular disease)   . Anemia, iron deficiency     Past Surgical History  Procedure Laterality Date  . Appendectomy    . Hysterectomy - unknown type    . Oophorectomy    . Abdominal hysterectomy      History   Social History  . Marital Status: Single    Spouse Name: N/A    Number of Children: 3  . Years of Education: N/A   Occupational History  . homemaker    Social History Main Topics  . Smoking status: Former Smoker    Quit date: 10/25/2007  . Smokeless tobacco: Never Used     Comment: Stopped 1.5 yrs ago January 2009  . Alcohol Use: No  . Drug Use: No  . Sexually Active: Not on file   Other Topics Concern  . Not on file   Social History Narrative  . No narrative on file    Family History  Problem Relation Age of Onset  . Heart disease    . Muscular dystrophy    . Stroke    . Heart disease Father   . Glaucoma Father   . Diabetes Sister   . Diabetes Son      Allergies as of 01/15/2013  . (No Known Allergies)    Current Outpatient Prescriptions on File Prior to Visit  Medication Sig Dispense Refill  . ACTONEL 150 MG tablet TAKE 1 TABLET BY MOUTH ONCE A MONTH-TAKEON THE SAME DAY OF EACH MONTH.  1 each  8  . amLODipine (NORVASC) 10 MG tablet TAKE ONE (1) TABLET EACH DAY                                                  GENERIC FOR NORVASC  30 tablet  11  . aspirin 325 MG tablet Take 325 mg by mouth daily.        Marland Kitchen atorvastatin (LIPITOR) 80 MG tablet TAKE ONE (1) TABLET EACH DAY  30 tablet  6  . calcitRIOL (ROCALTROL) 0.25 MCG capsule Take 0.25 mcg by mouth daily.      . clopidogrel (PLAVIX) 75 MG tablet TAKE ONE (1) TABLET EACH DAY  30 tablet  11  . ferrous sulfate 325 (65 FE) MG tablet Take 325 mg by mouth  2 (two) times daily.        . furosemide (LASIX) 80 MG tablet Take 1 tablet (80 mg total) by mouth 2 (two) times daily.  180 tablet  3  . insulin glargine (LANTUS) 100 UNIT/ML injection Inject 12 Units into the skin daily.      . Insulin Pen Needle (BD PEN NEEDLE NANO U/F) 32G X 4 MM MISC 1 application by Does not apply route daily.  100 each  11  . JANUVIA 100 MG tablet TAKE 1/2 TO 1 TABLET BY MOUTH PER DAY   FOR SUGAR GREATER THAN 200  30 tablet  8  . metoprolol (LOPRESSOR) 100 MG tablet TAKE 1 TABLET 2 TIMES A DAY  60 tablet  5  . NITROSTAT 0.4 MG SL tablet USE AS DIRECTED  25 tablet  4  . omeprazole (PRILOSEC) 20 MG capsule TAKE ONE (1) CAPSULE EACH DAY  30 capsule  9  . pioglitazone (ACTOS) 45 MG tablet TAKE ONE TABLET DAILY  30 tablet  10  . timolol (BETIMOL) 0.5 % ophthalmic solution Place 1 drop into both eyes daily.        . vitamin B-12 (CYANOCOBALAMIN) 1000 MCG tablet Take 1,000 mcg by mouth daily.      Marland Kitchen ZETIA 10 MG tablet TAKE ONE (1) TABLET EACH DAY  90 tablet  1  . hydrOXYzine (ATARAX/VISTARIL) 25 MG tablet 1-2 tabs by mouth every 8 hrs as needed for itching  60 tablet  0   No current facility-administered medications on  file prior to visit.     REVIEW OF SYSTEMS:  Positives indicated with an "X"  CARDIOVASCULAR:  [ ]  chest pain   [ ]  chest pressure   [ ]  palpitations   [ ]  orthopnea   [ ]  dyspnea on exertion   [ ]  claudication   [ ]  rest pain   [ ]  DVT   [ ]  phlebitis PULMONARY:   [ ]  productive cough   [ ]  asthma   [ ]  wheezing NEUROLOGIC:   [ ]  weakness  [ ]  paresthesias  [ ]  aphasia  [ ]  amaurosis  [ ]  dizziness HEMATOLOGIC:   [ ]  bleeding problems   [ ]  clotting disorders MUSCULOSKELETAL:  [ ]  joint pain   [ ]  joint swelling GASTROINTESTINAL: [ ]   blood in stool  [ ]   hematemesis GENITOURINARY:  [ ]   dysuria  [ ]   hematuria PSYCHIATRIC:  [ ]  history of major depression INTEGUMENTARY:  [ ]  rashes  [ ]  ulcers CONSTITUTIONAL:  [ ]  fever   [ ]  chills  PHYSICAL EXAMINATION:  General: The patient is a well-nourished female, in no acute distress. Vital signs are BP 136/75  Pulse 65  Resp 16  Ht 5\' 1"  (1.549 m)  Wt 197 lb (89.359 kg)  BMI 37.24 kg/m2  SpO2 98% Pulmonary: There is a good air exchange bilaterally without wheezing or rales. Abdomen: Soft and non-tender with normal pitch bowel sounds. Musculoskeletal: There are no major deformities.  There is no significant extremity pain. Neurologic: No focal weakness or paresthesias are detected, Skin: She does have superficial scrapes on both forearms which appeared to be healing. Psychiatric: The patient has normal affect. Cardiovascular: There is a regular rate and rhythm without significant murmur appreciated. She does have 2+ radial pulses bilaterally. I do not palpate pedal pulses bilaterally. Carotid arteries without bruits bilaterally She does have small surface veins bilaterally  VVS Vascular Lab Studies:  Ordered and Independently Reviewed  her tributary vein mapping today was discussed with the patient. She does have patent cephalic vein throughout its course the right cephalic vein is significantly bigger than the left cephalic vein  by ultrasound.2  Impression and Plan:  Chronic renal insufficiency and need for hemodialysis access. I did discuss this at length with the patient and family present I explained to short-term hemodialysis catheter, AV fistula an AV graft. I have recommended attempted fistula in the right arm since his cephalic vein is of better caliber. The vein does run somewhat deep under the fat in this may require superficial mobilization of her vein in the future if she does have adequate size about we have scheduled this as an outpatient on 01/18/2013    [name] [facility] [place]: [phone]

## 2013-01-15 NOTE — Pre-Procedure Instructions (Signed)
[name]  01/15/2013   Your procedure is scheduled on:  01-18-2013  Report to [facility] at 5:30 AM.  Call this number if you have problems the morning of surgery: [phone]   Remember:   Do not eat food or drink liquids after midnight.   Take these medicines the morning of surgery with A SIP OF WATER: amlodipine(Norvasc),metoprolol(Lopressor)omeprazole(Prilosec),eye drops as directed,   Do not wear jewelry, make-up or nail polish.  Do not wear lotions, powders, or perfumes. You may wear deodorant.  Do not shave 48 hours prior to surgery.   Do not bring valuables to the hospital.  Contacts, dentures or bridgework may not be worn into surgery.    .   Patients discharged the day of surgery will not be allowed to drive home.   Name and phone number of your driver: _________________________   Special Instructions: Shower using CHG 2 nights before surgery and the night before surgery.  If you shower the day of surgery use CHG.  Use special wash - you have one bottle of CHG for all showers.  You should use approximately 1/3 of the bottle for each shower.   Please read over the following fact sheets that you were given: Pain Booklet and Surgical Site Infection Prevention

## 2013-01-17 MED ORDER — DEXTROSE 5 % IV SOLN
1.5000 g | INTRAVENOUS | Status: AC
  Administered 2013-01-18: 1.5 g via INTRAVENOUS
  Filled 2013-01-17: qty 1.5

## 2013-01-17 MED ORDER — SODIUM CHLORIDE 0.9 % IV SOLN
INTRAVENOUS | Status: DC
  Administered 2013-01-18: 09:00:00 via INTRAVENOUS

## 2013-01-18 ENCOUNTER — Other Ambulatory Visit: Payer: Self-pay | Admitting: *Deleted

## 2013-01-18 ENCOUNTER — Ambulatory Visit (HOSPITAL_COMMUNITY)
Admission: RE | Admit: 2013-01-18 | Discharge: 2013-01-18 | Disposition: A | Discharge status: Home / Self Care | Payer: Medicare PPO | Source: Ambulatory Visit | Attending: Vascular Surgery | Admitting: Vascular Surgery

## 2013-01-18 ENCOUNTER — Encounter (HOSPITAL_COMMUNITY)
Admission: RE | Disposition: A | Discharge status: Home / Self Care | Payer: Self-pay | Source: Ambulatory Visit | Attending: Vascular Surgery

## 2013-01-18 ENCOUNTER — Encounter (HOSPITAL_COMMUNITY): Payer: Self-pay | Admitting: Anesthesiology

## 2013-01-18 ENCOUNTER — Ambulatory Visit (HOSPITAL_COMMUNITY): Payer: Medicare PPO | Admitting: Anesthesiology

## 2013-01-18 DIAGNOSIS — I120 Hypertensive chronic kidney disease with stage 5 chronic kidney disease or end stage renal disease: Secondary | ICD-10-CM | POA: Insufficient documentation

## 2013-01-18 DIAGNOSIS — Z4931 Encounter for adequacy testing for hemodialysis: Secondary | ICD-10-CM

## 2013-01-18 DIAGNOSIS — N186 End stage renal disease: Secondary | ICD-10-CM

## 2013-01-18 DIAGNOSIS — E119 Type 2 diabetes mellitus without complications: Secondary | ICD-10-CM | POA: Insufficient documentation

## 2013-01-18 HISTORY — PX: AV FISTULA PLACEMENT: SHX1204

## 2013-01-18 LAB — GLUCOSE, CAPILLARY: Glucose-Capillary: 108 mg/dL — ABNORMAL HIGH (ref 70–99)

## 2013-01-18 LAB — POCT I-STAT 4, (NA,K, GLUC, HGB,HCT)
HCT: 35 % — ABNORMAL LOW (ref 36.0–46.0)
Hemoglobin: 11.9 g/dL — ABNORMAL LOW (ref 12.0–15.0)

## 2013-01-18 SURGERY — ARTERIOVENOUS (AV) FISTULA CREATION
Anesthesia: Monitor Anesthesia Care | Site: Arm Lower | Laterality: Right | Wound class: Clean

## 2013-01-18 MED ORDER — PROPOFOL 10 MG/ML IV BOLUS
INTRAVENOUS | Status: DC | PRN
  Administered 2013-01-18: 30 mg via INTRAVENOUS
  Administered 2013-01-18 (×2): 40 mg via INTRAVENOUS
  Administered 2013-01-18 (×2): 20 mg via INTRAVENOUS
  Administered 2013-01-18: 50 mg via INTRAVENOUS
  Administered 2013-01-18: 20 mg via INTRAVENOUS
  Administered 2013-01-18: 40 mg via INTRAVENOUS
  Administered 2013-01-18: 30 mg via INTRAVENOUS

## 2013-01-18 MED ORDER — DEXTROSE 50 % IV SOLN
25.0000 mL | INTRAVENOUS | Status: DC
  Administered 2013-01-18: 25 mL via INTRAVENOUS

## 2013-01-18 MED ORDER — POTASSIUM CHLORIDE 10 MEQ/100ML IV SOLN
10.0000 meq | INTRAVENOUS | Status: DC
  Administered 2013-01-18 (×2): 10 meq via INTRAVENOUS
  Filled 2013-01-18 (×4): qty 100

## 2013-01-18 MED ORDER — MIDAZOLAM HCL 5 MG/5ML IJ SOLN
INTRAMUSCULAR | Status: DC | PRN
  Administered 2013-01-18: 2 mg via INTRAVENOUS

## 2013-01-18 MED ORDER — FENTANYL CITRATE 0.05 MG/ML IJ SOLN
INTRAMUSCULAR | Status: DC | PRN
  Administered 2013-01-18 (×2): 25 ug via INTRAVENOUS
  Administered 2013-01-18: 50 ug via INTRAVENOUS
  Administered 2013-01-18: 25 ug via INTRAVENOUS

## 2013-01-18 MED ORDER — ONDANSETRON HCL 4 MG/2ML IJ SOLN
INTRAMUSCULAR | Status: DC | PRN
  Administered 2013-01-18: 4 mg via INTRAVENOUS

## 2013-01-18 MED ORDER — DEXTROSE 50 % IV SOLN
25.0000 mL | Freq: Once | INTRAVENOUS | Status: AC
  Administered 2013-01-18: 25 mL via INTRAVENOUS

## 2013-01-18 MED ORDER — DEXTROSE 50 % IV SOLN
INTRAVENOUS | Status: AC
  Administered 2013-01-18: 25 mL
  Filled 2013-01-18: qty 50

## 2013-01-18 MED ORDER — LIDOCAINE-EPINEPHRINE 0.5 %-1:200000 IJ SOLN
INTRAMUSCULAR | Status: AC
  Filled 2013-01-18: qty 1

## 2013-01-18 MED ORDER — METOPROLOL TARTRATE 100 MG PO TABS
100.0000 mg | ORAL_TABLET | Freq: Once | ORAL | Status: DC
  Filled 2013-01-18: qty 1

## 2013-01-18 MED ORDER — DEXTROSE 50 % IV SOLN
25.0000 mL | Freq: Once | INTRAVENOUS | Status: DC
  Filled 2013-01-18: qty 50

## 2013-01-18 MED ORDER — OXYCODONE HCL 5 MG/5ML PO SOLN: 5.0000 mg | Freq: Once | ORAL | Status: DC | PRN

## 2013-01-18 MED ORDER — SODIUM CHLORIDE 0.9 % IV SOLN
INTRAVENOUS | Status: DC | PRN
  Administered 2013-01-18: 07:00:00 via INTRAVENOUS

## 2013-01-18 MED ORDER — FENTANYL CITRATE 0.05 MG/ML IJ SOLN: 25.0000 ug | INTRAMUSCULAR | Status: DC | PRN

## 2013-01-18 MED ORDER — LIDOCAINE-EPINEPHRINE 0.5 %-1:200000 IJ SOLN
INTRAMUSCULAR | Status: DC | PRN
  Administered 2013-01-18: 50 mL

## 2013-01-18 MED ORDER — SODIUM CHLORIDE 0.9 % IR SOLN
Status: DC | PRN
  Administered 2013-01-18: 07:00:00

## 2013-01-18 MED ORDER — OXYCODONE HCL 5 MG PO TABS: 5.0000 mg | ORAL_TABLET | Freq: Four times a day (QID) | ORAL | Status: DC | PRN

## 2013-01-18 MED ORDER — DEXTROSE-NACL 5-0.45 % IV SOLN: INTRAVENOUS | Status: DC

## 2013-01-18 MED ORDER — 0.9 % SODIUM CHLORIDE (POUR BTL) OPTIME
TOPICAL | Status: DC | PRN
  Administered 2013-01-18: 1000 mL

## 2013-01-18 MED ORDER — OXYCODONE HCL 5 MG PO TABS: 5.0000 mg | ORAL_TABLET | Freq: Once | ORAL | Status: DC | PRN

## 2013-01-18 MED ORDER — ONDANSETRON HCL 4 MG/2ML IJ SOLN: 4.0000 mg | Freq: Four times a day (QID) | INTRAMUSCULAR | Status: DC | PRN

## 2013-01-18 SURGICAL SUPPLY — 37 items
APL SKNCLS STERI-STRIP NONHPOA (GAUZE/BANDAGES/DRESSINGS) ×1
BENZOIN TINCTURE PRP APPL 2/3 (GAUZE/BANDAGES/DRESSINGS) ×2 IMPLANT
CANISTER SUCTION 2500CC (MISCELLANEOUS) ×2 IMPLANT
CLIP LIGATING EXTRA MED SLVR (CLIP) ×2 IMPLANT
CLIP LIGATING EXTRA SM BLUE (MISCELLANEOUS) ×2 IMPLANT
CLOTH BEACON ORANGE TIMEOUT ST (SAFETY) ×2 IMPLANT
COVER PROBE W GEL 5X96 (DRAPES) ×2 IMPLANT
COVER SURGICAL LIGHT HANDLE (MISCELLANEOUS) ×2 IMPLANT
DECANTER SPIKE VIAL GLASS SM (MISCELLANEOUS) ×2 IMPLANT
ELECT REM PT RETURN 9FT ADLT (ELECTROSURGICAL) ×2
ELECTRODE REM PT RTRN 9FT ADLT (ELECTROSURGICAL) ×1 IMPLANT
GEL ULTRASOUND 20GR AQUASONIC (MISCELLANEOUS) IMPLANT
GLOVE BIO SURGEON STRL SZ 6.5 (GLOVE) ×2 IMPLANT
GLOVE BIOGEL PI IND STRL 6.5 (GLOVE) IMPLANT
GLOVE BIOGEL PI IND STRL 7.0 (GLOVE) IMPLANT
GLOVE BIOGEL PI INDICATOR 6.5 (GLOVE) ×1
GLOVE BIOGEL PI INDICATOR 7.0 (GLOVE) ×1
GLOVE ECLIPSE 7.5 STRL STRAW (GLOVE) ×1 IMPLANT
GLOVE SS BIOGEL STRL SZ 7.5 (GLOVE) ×1 IMPLANT
GLOVE SUPERSENSE BIOGEL SZ 7.5 (GLOVE) ×1
GOWN STRL NON-REIN LRG LVL3 (GOWN DISPOSABLE) ×4 IMPLANT
GOWN STRL REIN XL XLG (GOWN DISPOSABLE) ×1 IMPLANT
KIT BASIN OR (CUSTOM PROCEDURE TRAY) ×2 IMPLANT
KIT ROOM TURNOVER OR (KITS) ×2 IMPLANT
NS IRRIG 1000ML POUR BTL (IV SOLUTION) ×2 IMPLANT
PACK CV ACCESS (CUSTOM PROCEDURE TRAY) ×2 IMPLANT
PAD ARMBOARD 7.5X6 YLW CONV (MISCELLANEOUS) ×4 IMPLANT
SPONGE GAUZE 4X4 12PLY (GAUZE/BANDAGES/DRESSINGS) ×2 IMPLANT
STRIP CLOSURE SKIN 1/2X4 (GAUZE/BANDAGES/DRESSINGS) ×2 IMPLANT
SUT PROLENE 6 0 CC (SUTURE) ×3 IMPLANT
SUT VIC AB 3-0 SH 27 (SUTURE) ×2
SUT VIC AB 3-0 SH 27X BRD (SUTURE) ×1 IMPLANT
TAPE CLOTH SURG 4X10 WHT LF (GAUZE/BANDAGES/DRESSINGS) ×1 IMPLANT
TOWEL OR 17X24 6PK STRL BLUE (TOWEL DISPOSABLE) ×2 IMPLANT
TOWEL OR 17X26 10 PK STRL BLUE (TOWEL DISPOSABLE) ×2 IMPLANT
UNDERPAD 30X30 INCONTINENT (UNDERPADS AND DIAPERS) ×2 IMPLANT
WATER STERILE IRR 1000ML POUR (IV SOLUTION) ×2 IMPLANT

## 2013-01-18 NOTE — Progress Notes (Signed)
Attempting to draw cbg and Potassium levels via i stat

## 2013-01-18 NOTE — H&P (View-Only) (Signed)
Vascular and Vein Specialist of [facility]   Patient name: [name] MRN: [id number] DOB: [birth year] Sex: female   Referred by: Mercy Moore  Reason for referral:  Chief Complaint  Patient presents with  . ESRD    Eval for HD access, ref. by Dr. Mercy Moore    HISTORY OF PRESENT ILLNESS: Patient is a very pleasant 67 year old female with progressive chronic renal insufficiency. She is seeing [place] today for discussion of hemodialysis access. She reports that she has watts V. instructional video and does have some pulmonary understanding of hemodialysis access. She has not been on dialysis to date.  She has a multiple medical problems. She is right-handed.  Past Medical History  Diagnosis Date  . Glaucoma(365)   . Mitral regurgitation   . CAD (coronary artery disease)   . DM2 (diabetes mellitus, type 2)   . GERD (gastroesophageal reflux disease)   . HLD (hyperlipidemia)   . HTN (hypertension)   . PUD (peptic ulcer disease)   . Anemia   . Carotid stenosis   . Osteoporosis   . PVD (peripheral vascular disease)   . Anemia, iron deficiency     Past Surgical History  Procedure Laterality Date  . Appendectomy    . Hysterectomy - unknown type    . Oophorectomy    . Abdominal hysterectomy      History   Social History  . Marital Status: Single    Spouse Name: N/A    Number of Children: 3  . Years of Education: N/A   Occupational History  . homemaker    Social History Main Topics  . Smoking status: Former Smoker    Quit date: 10/25/2007  . Smokeless tobacco: Never Used     Comment: Stopped 1.5 yrs ago January 2009  . Alcohol Use: No  . Drug Use: No  . Sexually Active: Not on file   Other Topics Concern  . Not on file   Social History Narrative  . No narrative on file    Family History  Problem Relation Age of Onset  . Heart disease    . Muscular dystrophy    . Stroke    . Heart disease Father   . Glaucoma Father   . Diabetes Sister   . Diabetes Son      Allergies as of 01/15/2013  . (No Known Allergies)    Current Outpatient Prescriptions on File Prior to Visit  Medication Sig Dispense Refill  . ACTONEL 150 MG tablet TAKE 1 TABLET BY MOUTH ONCE A MONTH-TAKEON THE SAME DAY OF EACH MONTH.  1 each  8  . amLODipine (NORVASC) 10 MG tablet TAKE ONE (1) TABLET EACH DAY                                                  GENERIC FOR NORVASC  30 tablet  11  . aspirin 325 MG tablet Take 325 mg by mouth daily.        Marland Kitchen atorvastatin (LIPITOR) 80 MG tablet TAKE ONE (1) TABLET EACH DAY  30 tablet  6  . calcitRIOL (ROCALTROL) 0.25 MCG capsule Take 0.25 mcg by mouth daily.      . clopidogrel (PLAVIX) 75 MG tablet TAKE ONE (1) TABLET EACH DAY  30 tablet  11  . ferrous sulfate 325 (65 FE) MG tablet Take 325 mg by mouth  2 (two) times daily.        . furosemide (LASIX) 80 MG tablet Take 1 tablet (80 mg total) by mouth 2 (two) times daily.  180 tablet  3  . insulin glargine (LANTUS) 100 UNIT/ML injection Inject 12 Units into the skin daily.      . Insulin Pen Needle (BD PEN NEEDLE NANO U/F) 32G X 4 MM MISC 1 application by Does not apply route daily.  100 each  11  . JANUVIA 100 MG tablet TAKE 1/2 TO 1 TABLET BY MOUTH PER DAY   FOR SUGAR GREATER THAN 200  30 tablet  8  . metoprolol (LOPRESSOR) 100 MG tablet TAKE 1 TABLET 2 TIMES A DAY  60 tablet  5  . NITROSTAT 0.4 MG SL tablet USE AS DIRECTED  25 tablet  4  . omeprazole (PRILOSEC) 20 MG capsule TAKE ONE (1) CAPSULE EACH DAY  30 capsule  9  . pioglitazone (ACTOS) 45 MG tablet TAKE ONE TABLET DAILY  30 tablet  10  . timolol (BETIMOL) 0.5 % ophthalmic solution Place 1 drop into both eyes daily.        . vitamin B-12 (CYANOCOBALAMIN) 1000 MCG tablet Take 1,000 mcg by mouth daily.      Marland Kitchen ZETIA 10 MG tablet TAKE ONE (1) TABLET EACH DAY  90 tablet  1  . hydrOXYzine (ATARAX/VISTARIL) 25 MG tablet 1-2 tabs by mouth every 8 hrs as needed for itching  60 tablet  0   No current facility-administered medications on  file prior to visit.     REVIEW OF SYSTEMS:  Positives indicated with an "X"  CARDIOVASCULAR:  [ ]  chest pain   [ ]  chest pressure   [ ]  palpitations   [ ]  orthopnea   [ ]  dyspnea on exertion   [ ]  claudication   [ ]  rest pain   [ ]  DVT   [ ]  phlebitis PULMONARY:   [ ]  productive cough   [ ]  asthma   [ ]  wheezing NEUROLOGIC:   [ ]  weakness  [ ]  paresthesias  [ ]  aphasia  [ ]  amaurosis  [ ]  dizziness HEMATOLOGIC:   [ ]  bleeding problems   [ ]  clotting disorders MUSCULOSKELETAL:  [ ]  joint pain   [ ]  joint swelling GASTROINTESTINAL: [ ]   blood in stool  [ ]   hematemesis GENITOURINARY:  [ ]   dysuria  [ ]   hematuria PSYCHIATRIC:  [ ]  history of major depression INTEGUMENTARY:  [ ]  rashes  [ ]  ulcers CONSTITUTIONAL:  [ ]  fever   [ ]  chills  PHYSICAL EXAMINATION:  General: The patient is a well-nourished female, in no acute distress. Vital signs are BP 136/75  Pulse 65  Resp 16  Ht 5\' 1"  (1.549 m)  Wt 197 lb (89.359 kg)  BMI 37.24 kg/m2  SpO2 98% Pulmonary: There is a good air exchange bilaterally without wheezing or rales. Abdomen: Soft and non-tender with normal pitch bowel sounds. Musculoskeletal: There are no major deformities.  There is no significant extremity pain. Neurologic: No focal weakness or paresthesias are detected, Skin: She does have superficial scrapes on both forearms which appeared to be healing. Psychiatric: The patient has normal affect. Cardiovascular: There is a regular rate and rhythm without significant murmur appreciated. She does have 2+ radial pulses bilaterally. I do not palpate pedal pulses bilaterally. Carotid arteries without bruits bilaterally She does have small surface veins bilaterally  VVS Vascular Lab Studies:  Ordered and Independently Reviewed  her tributary vein mapping today was discussed with the patient. She does have patent cephalic vein throughout its course the right cephalic vein is significantly bigger than the left cephalic vein  by ultrasound.2  Impression and Plan:  Chronic renal insufficiency and need for hemodialysis access. I did discuss this at length with the patient and family present I explained to short-term hemodialysis catheter, AV fistula an AV graft. I have recommended attempted fistula in the right arm since his cephalic vein is of better caliber. The vein does run somewhat deep under the fat in this may require superficial mobilization of her vein in the future if she does have adequate size about we have scheduled this as an outpatient on 01/18/2013    Erza Mothershead [facility] [place]: [phone]

## 2013-01-18 NOTE — Anesthesia Preprocedure Evaluation (Signed)
Anesthesia Evaluation  Patient identified by MRN, date of birth, ID band Patient awake    Reviewed: Allergy & Precautions, H&P , NPO status , Patient's Chart, lab work & pertinent test results  Airway Mallampati: II  Neck ROM: full    Dental   Pulmonary shortness of breath,          Cardiovascular hypertension, + CAD and + Peripheral Vascular Disease + Valvular Problems/Murmurs MR     Neuro/Psych  Neuromuscular disease    GI/Hepatic PUD, GERD-  ,  Endo/Other  diabetes, Type 2obese  Renal/GU ESRF and DialysisRenal disease     Musculoskeletal  (+) Arthritis -,   Abdominal   Peds  Hematology   Anesthesia Other Findings   Reproductive/Obstetrics                           Anesthesia Physical Anesthesia Plan  ASA: III  Anesthesia Plan: MAC   Post-op Pain Management:    Induction: Intravenous  Airway Management Planned: Simple Face Mask  Additional Equipment:   Intra-op Plan:   Post-operative Plan:   Informed Consent: I have reviewed the patients History and Physical, chart, labs and discussed the procedure including the risks, benefits and alternatives for the proposed anesthesia with the patient or authorized representative who has indicated his/her understanding and acceptance.     Plan Discussed with: CRNA and Surgeon  Anesthesia Plan Comments:         Anesthesia Quick Evaluation

## 2013-01-18 NOTE — Progress Notes (Signed)
Paged Dr. Fulton Reek

## 2013-01-18 NOTE — Op Note (Signed)
OPERATIVE REPORT  DATE OF SURGERY: 01/18/2013  PATIENT: [name], 67 y.o. female MRN: [id number]  DOB: 31-May-1946  PRE-OPERATIVE DIAGNOSIS: Chronic renal insufficiency  POST-OPERATIVE DIAGNOSIS:  Same  PROCEDURE: Right Cimino radiocephalic AV fistula  SURGEON:  Curt Jews, M.D.  PHYSICIAN ASSISTANT: Rhyne  ANESTHESIA:  1/2% lidocaine local with IV sedation  EBL: Minimal ml  Total I/O In: 450 [I.V.:450] Out: 10 [Blood:10]  BLOOD ADMINISTERED: None  DRAINS: None  SPECIMEN: None  COUNTS CORRECT:  YES  PLAN OF CARE: PACU   PATIENT DISPOSITION:  PACU - hemodynamically stable  PROCEDURE DETAILS: The patient was taken to the operating placed supine position where the area of the right arm was prepped in usual sterile fashion. Preoperative vein mapping showed a patent cephalic vein throughout its course with moderate size. The vein was reimaged with the SonoSite ultrasound in the operating room. The cephalic vein at the wrist appeared to be better than the cephalic vein at the antecubital space. Also the vein at the antecubital space was extremely deep under the fat. Using local anesthesia incision was made between the level of the cephalic vein and the radial artery at the wrist. The vein was exposed and was of moderate size. Tributary branches were ligated with 4-0 silk ties and divided. The vein was ligated distally and divided and mobilized the level of the radial artery. The vein was gently dilated with heparinized saline and was of moderate size. The artery was exposed at the same incision and was small. It had minimal atherosclerotic change. The artery was occluded proximally and distally and opened with an 11 blade and extended longitudinally with Potts scissors. The vein was spatulated and sewn end-to-side to the artery with a running 6-0 Prolene suture. Clamps removed and good flow was noted through the fistula. The wound irrigated with saline. Hemostasis was obtained  left cautery. Wounds were closed with 3-0 Vicryl in the subcutaneous and subcuticular tissue. Benzoin stricture for applied   Curt Jews, M.D. 01/18/2013 1:08 PM

## 2013-01-18 NOTE — Interval H&P Note (Signed)
History and Physical Interval Note:  01/18/2013 7:15 AM  [name]  has presented today for surgery, with the diagnosis of End Stage Renal Disease  The various methods of treatment have been discussed with the patient and family. After consideration of risks, benefits and other options for treatment, the patient has consented to  Procedure(s): ARTERIOVENOUS (AV) FISTULA CREATION (Right) as a surgical intervention .  The patient's history has been reviewed, patient examined, no change in status, stable for surgery.  I have reviewed the patient's chart and labs.  Questions were answered to the patient's satisfaction.     Kimble Delaurentis

## 2013-01-18 NOTE — Transfer of Care (Signed)
Immediate Anesthesia Transfer of Care Note  Patient: [name]  Procedure(s) Performed: Procedure(s) with comments: ARTERIOVENOUS (AV) FISTULA CREATION (Right) - Ultrasound guided  Patient Location: PACU  Anesthesia Type:MAC  Level of Consciousness: awake, alert  and oriented  Airway & Oxygen Therapy: Patient Spontanous Breathing  Post-op Assessment: Report given to PACU RN, Post -op Vital signs reviewed and stable and Patient moving all extremities X 4  Post vital signs: Reviewed and stable  Complications: No apparent anesthesia complications

## 2013-01-18 NOTE — Anesthesia Postprocedure Evaluation (Signed)
Anesthesia Post Note  Patient: [name]  Procedure(s) Performed: Procedure(s) (LRB): ARTERIOVENOUS (AV) FISTULA CREATION (Right)  Anesthesia type: MAC  Patient location: PACU  Post pain: Pain level controlled and Adequate analgesia  Post assessment: Post-op Vital signs reviewed, Patient's Cardiovascular Status Stable and Respiratory Function Stable  Last Vitals:  Filed Vitals:   01/18/13 0915  BP:   Pulse: 58  Temp:   Resp: 14    Post vital signs: Reviewed and stable  Level of consciousness: awake, alert  and oriented  Complications: No apparent anesthesia complications

## 2013-01-18 NOTE — Progress Notes (Signed)
Pt states that she took her blood sugar at home and it was 65. Istat shows sugar at 59.  Pt states she feels a little tired but otherwise OK.  Took her usual Lantus and Januvia last evening.  Has been NPO since 2300.  Flow manager notified and she spoke with Dr Albertina Parr .  Orders received and executed.

## 2013-01-18 NOTE — Progress Notes (Signed)
By I-stat K+ =  3.5, Glucose =  137, na+ =  143, and H/H = 11/33.  Values called to Dr. Annye Asa and he ordered to hold the remaining doses of Potassium.

## 2013-01-18 NOTE — Progress Notes (Signed)
Faint bruit, able to hear thrill

## 2013-01-19 LAB — POCT I-STAT 4, (NA,K, GLUC, HGB,HCT)
Glucose, Bld: 58 mg/dL — ABNORMAL LOW (ref 70–99)
Potassium: 2.5 mEq/L — CL (ref 3.5–5.1)

## 2013-01-21 ENCOUNTER — Other Ambulatory Visit (HOSPITAL_COMMUNITY): Payer: Self-pay | Admitting: *Deleted

## 2013-01-21 ENCOUNTER — Telehealth: Payer: Self-pay | Admitting: Vascular Surgery

## 2013-01-21 ENCOUNTER — Encounter (HOSPITAL_COMMUNITY): Payer: Self-pay | Admitting: Vascular Surgery

## 2013-01-21 NOTE — Telephone Encounter (Addendum)
Message copied by Lujean Amel on Mon Jan 21, 2013 11:38 AM ------      Message from: Mena Goes      Created: Fri Jan 18, 2013  3:50 PM      Regarding: schedule                   ----- Message -----         From: Gabriel Earing, PA-C         Sent: 01/18/2013   8:28 AM           To: Mena Goes, CMA            S/p right Radiocephalic AVF by Dr. Donnetta Hutching 01/18/13.  F/u in 4 weeks.            Thanks,      Aldona Bar ------  I scheduled an appt for the above pt on 02/19/13 at 11:45am w/ TFE. The patient is aware of the appt and I also mailed an appt letter/awt

## 2013-01-22 ENCOUNTER — Encounter (HOSPITAL_COMMUNITY)
Admission: RE | Admit: 2013-01-22 | Discharge: 2013-01-22 | Disposition: A | Discharge status: Home / Self Care | Payer: Medicare PPO | Source: Ambulatory Visit | Attending: Nephrology | Admitting: Nephrology

## 2013-01-22 DIAGNOSIS — N184 Chronic kidney disease, stage 4 (severe): Secondary | ICD-10-CM | POA: Insufficient documentation

## 2013-01-22 DIAGNOSIS — D649 Anemia, unspecified: Secondary | ICD-10-CM | POA: Insufficient documentation

## 2013-01-22 LAB — IRON AND TIBC: UIBC: 249 ug/dL (ref 125–400)

## 2013-01-22 LAB — FERRITIN: Ferritin: 212 ng/mL (ref 10–291)

## 2013-01-22 MED ORDER — EPOETIN ALFA 20000 UNIT/ML IJ SOLN
INTRAMUSCULAR | Status: AC
  Administered 2013-01-22: 20000 [IU] via SUBCUTANEOUS
  Filled 2013-01-22: qty 1

## 2013-01-22 MED ORDER — EPOETIN ALFA 10000 UNIT/ML IJ SOLN: 20000.0000 [IU] | INTRAMUSCULAR | Status: DC

## 2013-01-25 LAB — POCT I-STAT 4, (NA,K, GLUC, HGB,HCT)
Potassium: 3.5 mEq/L (ref 3.5–5.1)
Sodium: 143 mEq/L (ref 135–145)

## 2013-02-11 ENCOUNTER — Encounter: Payer: Self-pay | Admitting: Vascular Surgery

## 2013-02-11 ENCOUNTER — Other Ambulatory Visit (HOSPITAL_COMMUNITY): Payer: Self-pay | Admitting: *Deleted

## 2013-02-12 ENCOUNTER — Encounter (INDEPENDENT_AMBULATORY_CARE_PROVIDER_SITE_OTHER): Payer: Medicare PPO | Admitting: *Deleted

## 2013-02-12 ENCOUNTER — Encounter: Payer: Self-pay | Admitting: Vascular Surgery

## 2013-02-12 ENCOUNTER — Encounter (HOSPITAL_COMMUNITY)
Admission: RE | Admit: 2013-02-12 | Discharge: 2013-02-12 | Disposition: A | Discharge status: Home / Self Care | Payer: Medicare PPO | Source: Ambulatory Visit | Attending: Nephrology | Admitting: Nephrology

## 2013-02-12 ENCOUNTER — Ambulatory Visit (INDEPENDENT_AMBULATORY_CARE_PROVIDER_SITE_OTHER): Payer: Medicare PPO | Admitting: Vascular Surgery

## 2013-02-12 VITALS — BP 130/62 | HR 70 | Ht 61.0 in | Wt 201.7 lb

## 2013-02-12 DIAGNOSIS — N186 End stage renal disease: Secondary | ICD-10-CM

## 2013-02-12 DIAGNOSIS — Z4931 Encounter for adequacy testing for hemodialysis: Secondary | ICD-10-CM

## 2013-02-12 LAB — POCT HEMOGLOBIN-HEMACUE: Hemoglobin: 10.6 g/dL — ABNORMAL LOW (ref 12.0–15.0)

## 2013-02-12 MED ORDER — SODIUM CHLORIDE 0.9 % IV SOLN
INTRAVENOUS | Status: DC
  Administered 2013-02-12: 10:00:00 via INTRAVENOUS

## 2013-02-12 MED ORDER — SODIUM CHLORIDE 0.9 % IV SOLN
1020.0000 mg | Freq: Once | INTRAVENOUS | Status: AC
  Administered 2013-02-12: 1020 mg via INTRAVENOUS
  Filled 2013-02-12: qty 34

## 2013-02-12 MED ORDER — EPOETIN ALFA 20000 UNIT/ML IJ SOLN
INTRAMUSCULAR | Status: AC
  Administered 2013-02-12: 20000 [IU] via SUBCUTANEOUS
  Filled 2013-02-12: qty 1

## 2013-02-12 MED ORDER — EPOETIN ALFA 10000 UNIT/ML IJ SOLN: 20000.0000 [IU] | INTRAMUSCULAR | Status: DC

## 2013-02-12 NOTE — Progress Notes (Signed)
Patient presents for followup of right arm Cimino radiocephalic AV fistula creation by myself on 01/18/2013. She has minimal discomfort associated with this.  She does have multiple sores on both arms and is having some slow healing of her incision at the radiocephalic fistula. There is a eschar present with no evidence of infection. He does have a thrill and bruit in her forearm fistula.  Duplex today shows a size ranging from 0.52-0.59 cm throughout her upper arm cephalic vein.  Impression and plan a slow healing of incision and radiocephalic fistula site. She does have a good early size maturation. Her cephalic vein does run somewhat deep under the fat. I feel this may cause difficulty for access. We will see her back in the office in 6 weeks for continued followup to determine complete healing of her incision and also to monitor the maturation of her fistula

## 2013-02-19 ENCOUNTER — Other Ambulatory Visit: Payer: Self-pay | Admitting: Cardiology

## 2013-02-19 ENCOUNTER — Ambulatory Visit: Payer: Medicare PPO | Admitting: Vascular Surgery

## 2013-02-20 NOTE — Telephone Encounter (Signed)
..   Requested Prescriptions   Pending Prescriptions Disp Refills  . NITROSTAT 0.4 MG SL tablet [Pharmacy Med Name: NITROSTAT 0.4 MG TABLET SL] 25 tablet 1    Sig: USE AS DIRECTED

## 2013-03-05 ENCOUNTER — Encounter (HOSPITAL_COMMUNITY)
Admission: RE | Admit: 2013-03-05 | Discharge: 2013-03-05 | Disposition: A | Discharge status: Home / Self Care | Payer: Medicare PPO | Source: Ambulatory Visit | Attending: Nephrology | Admitting: Nephrology

## 2013-03-05 DIAGNOSIS — D649 Anemia, unspecified: Secondary | ICD-10-CM | POA: Insufficient documentation

## 2013-03-05 DIAGNOSIS — N184 Chronic kidney disease, stage 4 (severe): Secondary | ICD-10-CM | POA: Insufficient documentation

## 2013-03-05 MED ORDER — EPOETIN ALFA 10000 UNIT/ML IJ SOLN: 20000.0000 [IU] | INTRAMUSCULAR | Status: DC

## 2013-03-07 ENCOUNTER — Other Ambulatory Visit (HOSPITAL_COMMUNITY): Payer: Self-pay

## 2013-03-07 MED ORDER — ATORVASTATIN CALCIUM 80 MG PO TABS: 80.0000 mg | ORAL_TABLET | Freq: Every day | ORAL | Status: DC

## 2013-03-07 NOTE — Telephone Encounter (Signed)
..   Requested Prescriptions   Signed Prescriptions Disp Refills  . atorvastatin (LIPITOR) 80 MG tablet 30 tablet 6    Sig: Take 1 tablet (80 mg total) by mouth daily.    Authorizing Provider: Sherren Mocha    Ordering User: Ardis Hughs, Avishai Reihl Jerilynn Mages

## 2013-03-11 ENCOUNTER — Ambulatory Visit: Payer: Medicare PPO | Admitting: Internal Medicine

## 2013-03-11 DIAGNOSIS — Z0289 Encounter for other administrative examinations: Secondary | ICD-10-CM

## 2013-03-13 ENCOUNTER — Other Ambulatory Visit (INDEPENDENT_AMBULATORY_CARE_PROVIDER_SITE_OTHER): Payer: Medicare PPO

## 2013-03-13 ENCOUNTER — Ambulatory Visit (INDEPENDENT_AMBULATORY_CARE_PROVIDER_SITE_OTHER): Payer: Medicare PPO | Admitting: Internal Medicine

## 2013-03-13 ENCOUNTER — Encounter: Payer: Self-pay | Admitting: Internal Medicine

## 2013-03-13 VITALS — BP 112/80 | HR 63 | Temp 97.8°F | Ht 61.0 in | Wt 197.2 lb

## 2013-03-13 DIAGNOSIS — E119 Type 2 diabetes mellitus without complications: Secondary | ICD-10-CM

## 2013-03-13 DIAGNOSIS — M25579 Pain in unspecified ankle and joints of unspecified foot: Secondary | ICD-10-CM

## 2013-03-13 DIAGNOSIS — I10 Essential (primary) hypertension: Secondary | ICD-10-CM

## 2013-03-13 DIAGNOSIS — E785 Hyperlipidemia, unspecified: Secondary | ICD-10-CM

## 2013-03-13 LAB — LIPID PANEL
HDL: 38.5 mg/dL — ABNORMAL LOW (ref >39.00)
LDL Cholesterol: 84 mg/dL (ref 0–99)
Total CHOL/HDL Ratio: 4
Triglycerides: 181 mg/dL — ABNORMAL HIGH (ref 0.0–149.0)

## 2013-03-13 LAB — HEPATIC FUNCTION PANEL
ALT: 20 U/L (ref 0–35)
Alkaline Phosphatase: 56 U/L (ref 39–117)
Bilirubin, Direct: 0.2 mg/dL (ref 0.0–0.3)
Total Protein: 7.4 g/dL (ref 6.0–8.3)

## 2013-03-13 LAB — BASIC METABOLIC PANEL
CO2: 24 mEq/L (ref 19–32)
Chloride: 103 mEq/L (ref 96–112)
Creatinine, Ser: 3.7 mg/dL — ABNORMAL HIGH (ref 0.4–1.2)
Glucose, Bld: 128 mg/dL — ABNORMAL HIGH (ref 70–99)

## 2013-03-13 MED ORDER — INSULIN GLARGINE 100 UNIT/ML ~~LOC~~ SOLN: 12.0000 [IU] | Freq: Every day | SUBCUTANEOUS | Status: DC

## 2013-03-13 NOTE — Progress Notes (Signed)
Subjective:    Patient ID: [name], female    DOB: 1946/05/24, 67 y.o.   MRN: [id number]  HPI  Here to f/u; overall doing ok,  Pt denies chest pain, increased sob or doe, wheezing, orthopnea, PND, increased LE swelling, palpitations, dizziness or syncope.  Pt denies polydipsia, polyuria, or low sugar symptoms such as weakness or confusion improved with po intake.  Pt denies new neurological symptoms such as new headache, or facial or extremity weakness or numbness.   Pt states overall good compliance with meds, has been trying to follow lower cholesterol, diabetic diet, with wt overall stable,  but little exercise however. Not started PD or HD yet.  Has ongoing bilat foot pain, due to see podiatry.  Has minor edema right foot o/w no red/swelling/ulcers.  Walks with cane, needs handicapped parking form filled out.  Pt denies fever, wt loss, night sweats, loss of appetite, or other constitutional symptoms  Denies worsening depressive symptoms, suicidal ideation, or panic Past Medical History  Diagnosis Date  . Glaucoma(365)   . Mitral regurgitation   . DM2 (diabetes mellitus, type 2)   . GERD (gastroesophageal reflux disease)   . HLD (hyperlipidemia)   . HTN (hypertension)   . PUD (peptic ulcer disease)   . Anemia   . Carotid stenosis   . Osteoporosis   . PVD (peripheral vascular disease)   . Anemia, iron deficiency   . CAD (coronary artery disease)   . Shortness of breath     with exertion  . Chronic kidney disease     chronic renal failure  . Arthritis    Past Surgical History  Procedure Laterality Date  . Appendectomy    . Hysterectomy - unknown type    . Oophorectomy    . Abdominal hysterectomy    . Av fistula placement Right 01/18/2013    Procedure: ARTERIOVENOUS (AV) FISTULA CREATION;  Surgeon: Rosetta Posner, MD;  Location: [facility] OR;  Service: Vascular;  Laterality: Right;  Ultrasound guided    reports that she quit smoking about 5 years ago. She has never used smokeless  tobacco. She reports that she does not drink alcohol or use illicit drugs. family history includes Diabetes in her sister and son; Glaucoma in her father; Heart disease in her father and unspecified family member; Muscular dystrophy in an unspecified family member; and Stroke in an unspecified family member. No Known Allergies Current Outpatient Prescriptions on File Prior to Visit  Medication Sig Dispense Refill  . amLODipine (NORVASC) 10 MG tablet Take 10 mg by mouth daily.      Marland Kitchen aspirin 325 MG tablet Take 325 mg by mouth daily.       Marland Kitchen atorvastatin (LIPITOR) 80 MG tablet Take 1 tablet (80 mg total) by mouth daily.  30 tablet  6  . calcitRIOL (ROCALTROL) 0.25 MCG capsule Take 0.25 mcg by mouth daily.      . clopidogrel (PLAVIX) 75 MG tablet Take 75 mg by mouth daily.      Marland Kitchen ezetimibe (ZETIA) 10 MG tablet Take 10 mg by mouth daily.      . ferrous sulfate 325 (65 FE) MG tablet Take 325 mg by mouth 2 (two) times daily.        . furosemide (LASIX) 80 MG tablet Take 1 tablet (80 mg total) by mouth 2 (two) times daily.  180 tablet  3  . glimepiride (AMARYL) 4 MG tablet Take 4 mg by mouth daily before breakfast.       .  hydrOXYzine (ATARAX/VISTARIL) 25 MG tablet Take 25-50 mg by mouth 3 (three) times daily as needed for itching.      . Insulin Pen Needle (BD PEN NEEDLE NANO U/F) 32G X 4 MM MISC 1 application by Does not apply route daily.  100 each  11  . metoprolol (LOPRESSOR) 100 MG tablet Take 100 mg by mouth 2 (two) times daily.      Marland Kitchen NITROSTAT 0.4 MG SL tablet USE AS DIRECTED  25 tablet  1  . omeprazole (PRILOSEC) 20 MG capsule Take 20 mg by mouth daily.      Marland Kitchen oxyCODONE (ROXICODONE) 5 MG immediate release tablet Take 1 tablet (5 mg total) by mouth every 6 (six) hours as needed for pain.  30 tablet  0  . pioglitazone (ACTOS) 45 MG tablet Take 45 mg by mouth daily.      . risedronate (ACTONEL) 150 MG tablet Take 150 mg by mouth every 30 (thirty) days. with water on empty stomach, nothing by  mouth or lie down for next 30 minutes.      . sitaGLIPtin (JANUVIA) 100 MG tablet Take 50-100 mg by mouth 2 (two) times daily. Take 50mg  in the morning and 100mg  in the evening      . timolol (BETIMOL) 0.5 % ophthalmic solution Place 1 drop into both eyes daily.        . vitamin B-12 (CYANOCOBALAMIN) 1000 MCG tablet Take 1,000 mcg by mouth daily.       No current facility-administered medications on file prior to visit.   Review of Systems  Constitutional: Negative for unexpected weight change, or unusual diaphoresis  HENT: Negative for tinnitus.   Eyes: Negative for photophobia and visual disturbance.  Respiratory: Negative for choking and stridor.   Gastrointestinal: Negative for vomiting and blood in stool.  Genitourinary: Negative for hematuria and decreased urine volume.  Musculoskeletal: Negative for acute joint swelling Skin: Negative for color change and wound.  Neurological: Negative for tremors and numbness other than noted  Psychiatric/Behavioral: Negative for decreased concentration or  hyperactivity.       Objective:   Physical Exam BP 112/80  Pulse 63  Temp(Src) 97.8 F (36.6 C) (Oral)  Ht 5\' 1"  (1.549 m)  Wt 197 lb 4 oz (89.472 kg)  BMI 37.29 kg/m2  SpO2 96% VS noted, not ill appearing Constitutional: Pt appears well-developed and well-nourished. [name] HENT: Head: NCAT.  Right Ear: External ear normal.  Left Ear: External ear normal.  Eyes: Conjunctivae and EOM are normal. Pupils are equal, round, and reactive to light.  Neck: Normal range of motion. Neck supple.  Cardiovascular: Normal rate and regular rhythm.   Pulmonary/Chest: Effort normal and breath sounds normal.  Abd:  Soft, NT, non-distended, + BS Neurological: Pt is alert. Not confused . Walks with cane Skin: Skin is warm. No erythema. trace right pedal edema Psychiatric: Pt behavior is normal. Thought content normal.     Assessment & Plan:

## 2013-03-13 NOTE — Assessment & Plan Note (Signed)
stable overall by history and exam, recent data reviewed with pt, and pt to continue medical treatment as before,  to f/u any worsening symptoms or concerns Lab Results  Component Value Date   HGBA1C 7.0* 09/07/2012

## 2013-03-13 NOTE — Assessment & Plan Note (Signed)
stable overall by history and exam, recent data reviewed with pt, and pt to continue medical treatment as before,  to f/u any worsening symptoms or concerns BP Readings from Last 3 Encounters:  03/13/13 112/80  03/05/13 139/81  02/12/13 130/62

## 2013-03-13 NOTE — Assessment & Plan Note (Signed)
Exam ok, for podiatry referral, ok for handicap parking form filled out today

## 2013-03-13 NOTE — Patient Instructions (Addendum)
Please continue all other medications as before, and refills have been done if requested - the lantus Please keep your appointments with your specialists as you have planned You will be contacted regarding the referral for: podiatry You are given the handicapped parking form filled out today Please go to the LAB in the Basement (turn left off the elevator) for the tests to be done today You will be contacted by phone if any changes need to be made immediately.  Otherwise, you will receive a letter about your results with an explanation, but please check with MyChart first. Thank you for enrolling in [place]. Please follow the instructions below to securely access your online medical record. MyChart allows you to send messages to your doctor, view your test results, renew your prescriptions, schedule appointments, and more. Please return in 6 months, or sooner if needed

## 2013-03-13 NOTE — Assessment & Plan Note (Signed)
stable overall by history and exam, recent data reviewed with pt, and pt to continue medical treatment as before,  to f/u any worsening symptoms or concerns Lab Results  Component Value Date   LDLCALC 90 09/05/2011

## 2013-03-15 ENCOUNTER — Telehealth: Payer: Self-pay | Admitting: Internal Medicine

## 2013-03-15 NOTE — Telephone Encounter (Signed)
Received 4 pages from [facility], sent to Dr. Cathlean Cower. 03/15/13/ss

## 2013-03-19 ENCOUNTER — Other Ambulatory Visit: Payer: Self-pay | Admitting: Internal Medicine

## 2013-03-19 ENCOUNTER — Encounter (HOSPITAL_COMMUNITY)
Admission: RE | Admit: 2013-03-19 | Discharge: 2013-03-19 | Disposition: A | Discharge status: Home / Self Care | Payer: Medicare PPO | Source: Ambulatory Visit | Attending: Nephrology | Admitting: Nephrology

## 2013-03-19 LAB — POCT HEMOGLOBIN-HEMACUE: Hemoglobin: 10.6 g/dL — ABNORMAL LOW (ref 12.0–15.0)

## 2013-03-19 MED ORDER — EPOETIN ALFA 10000 UNIT/ML IJ SOLN: 20000.0000 [IU] | INTRAMUSCULAR | Status: DC

## 2013-03-19 MED ORDER — EPOETIN ALFA 20000 UNIT/ML IJ SOLN
INTRAMUSCULAR | Status: AC
  Administered 2013-03-19: 20000 [IU] via SUBCUTANEOUS
  Filled 2013-03-19: qty 1

## 2013-03-21 ENCOUNTER — Encounter: Payer: Self-pay | Admitting: Nephrology

## 2013-03-25 ENCOUNTER — Encounter: Payer: Self-pay | Admitting: Vascular Surgery

## 2013-03-26 ENCOUNTER — Ambulatory Visit (INDEPENDENT_AMBULATORY_CARE_PROVIDER_SITE_OTHER): Payer: Medicare PPO | Admitting: Vascular Surgery

## 2013-03-26 ENCOUNTER — Encounter: Payer: Self-pay | Admitting: Vascular Surgery

## 2013-03-26 VITALS — BP 129/80 | HR 65 | Resp 16 | Ht 61.0 in | Wt 197.0 lb

## 2013-03-26 DIAGNOSIS — N186 End stage renal disease: Secondary | ICD-10-CM

## 2013-03-26 DIAGNOSIS — Z48812 Encounter for surgical aftercare following surgery on the circulatory system: Secondary | ICD-10-CM

## 2013-03-26 NOTE — Progress Notes (Signed)
The patient has today for followup of her right AV fistula creation on 01/18/2013. I had seen her before 2214 with a patent fistula did not appear to be developing rapidly. She is seen today for continued followup. Duplex on 422 showed 3 mm vein in her forearm.  Physical exam she continues to have a good thrill through this area. The vein is not particularly well-developed. I did reimage this myself with SonoSite ultrasound. The vein is superficial enough but has Johnta Couts branching pattern with no dominant branch and small veins above. The main outflow is to the basilic which is also small in her upper arm.  Prior vein map 10/27/2023 showed a small left arm surface veins.  I discussed this at length with the patient and her family present. I feel it is very unlikely that this will develop a maturation for fistula use and also did not feel that there is any angioplasty or surgical revision that will improve his chance. I feel that she will be a candidate for a prosthetic graft when she approaches need for dialysis. She will continue her followup with Dr. Mercy Moore and we would requested a notify us when she approaches need for hemodialysis for a prosthetic graft placement

## 2013-04-02 ENCOUNTER — Encounter (INDEPENDENT_AMBULATORY_CARE_PROVIDER_SITE_OTHER): Payer: Medicare PPO

## 2013-04-02 DIAGNOSIS — I6529 Occlusion and stenosis of unspecified carotid artery: Secondary | ICD-10-CM

## 2013-04-02 DIAGNOSIS — I701 Atherosclerosis of renal artery: Secondary | ICD-10-CM

## 2013-04-02 DIAGNOSIS — I2510 Atherosclerotic heart disease of native coronary artery without angina pectoris: Secondary | ICD-10-CM

## 2013-04-09 ENCOUNTER — Encounter (HOSPITAL_COMMUNITY)
Admission: RE | Admit: 2013-04-09 | Discharge: 2013-04-09 | Disposition: A | Discharge status: Home / Self Care | Payer: Medicare PPO | Source: Ambulatory Visit | Attending: Nephrology | Admitting: Nephrology

## 2013-04-09 DIAGNOSIS — N184 Chronic kidney disease, stage 4 (severe): Secondary | ICD-10-CM | POA: Insufficient documentation

## 2013-04-09 DIAGNOSIS — D649 Anemia, unspecified: Secondary | ICD-10-CM | POA: Insufficient documentation

## 2013-04-09 MED ORDER — EPOETIN ALFA 20000 UNIT/ML IJ SOLN
INTRAMUSCULAR | Status: AC
  Administered 2013-04-09: 20000 [IU]
  Filled 2013-04-09: qty 1

## 2013-04-09 MED ORDER — EPOETIN ALFA 10000 UNIT/ML IJ SOLN: 20000.0000 [IU] | INTRAMUSCULAR | Status: DC

## 2013-04-14 ENCOUNTER — Other Ambulatory Visit: Payer: Self-pay | Admitting: Internal Medicine

## 2013-04-19 ENCOUNTER — Other Ambulatory Visit: Payer: Self-pay | Admitting: Internal Medicine

## 2013-04-29 ENCOUNTER — Other Ambulatory Visit (HOSPITAL_COMMUNITY): Payer: Self-pay | Admitting: *Deleted

## 2013-04-30 ENCOUNTER — Other Ambulatory Visit: Payer: Self-pay | Admitting: Internal Medicine

## 2013-04-30 ENCOUNTER — Encounter (HOSPITAL_COMMUNITY)
Admission: RE | Admit: 2013-04-30 | Discharge: 2013-04-30 | Disposition: A | Discharge status: Home / Self Care | Payer: Medicare PPO | Source: Ambulatory Visit | Attending: Nephrology | Admitting: Nephrology

## 2013-04-30 DIAGNOSIS — D649 Anemia, unspecified: Secondary | ICD-10-CM | POA: Insufficient documentation

## 2013-04-30 DIAGNOSIS — N184 Chronic kidney disease, stage 4 (severe): Secondary | ICD-10-CM | POA: Insufficient documentation

## 2013-04-30 LAB — POCT HEMOGLOBIN-HEMACUE: Hemoglobin: 11.9 g/dL — ABNORMAL LOW (ref 12.0–15.0)

## 2013-04-30 MED ORDER — EPOETIN ALFA 20000 UNIT/ML IJ SOLN
INTRAMUSCULAR | Status: AC
  Administered 2013-04-30: 20000 [IU] via SUBCUTANEOUS
  Filled 2013-04-30: qty 1

## 2013-04-30 MED ORDER — EPOETIN ALFA 10000 UNIT/ML IJ SOLN: 20000.0000 [IU] | INTRAMUSCULAR | Status: DC

## 2013-05-06 ENCOUNTER — Other Ambulatory Visit: Payer: Self-pay | Admitting: Internal Medicine

## 2013-05-21 ENCOUNTER — Encounter (HOSPITAL_COMMUNITY)
Admission: RE | Admit: 2013-05-21 | Discharge: 2013-05-21 | Disposition: A | Discharge status: Home / Self Care | Payer: Medicare PPO | Source: Ambulatory Visit | Attending: Nephrology | Admitting: Nephrology

## 2013-05-21 MED ORDER — EPOETIN ALFA 10000 UNIT/ML IJ SOLN: 20000.0000 [IU] | INTRAMUSCULAR | Status: DC

## 2013-05-30 ENCOUNTER — Telehealth: Payer: Self-pay | Admitting: Internal Medicine

## 2013-05-30 NOTE — Telephone Encounter (Signed)
Ms. Mccraney grandson needs documentation showing that he is her transportation for her doctor appointments for his job or school.

## 2013-05-30 NOTE — Telephone Encounter (Signed)
It is for school.  Asked Yzabelle to have her grandson get the forms to bring in to be filled out.

## 2013-05-30 NOTE — Telephone Encounter (Signed)
I think he means an FMLA form showing he is needed to help in her care  Pt should forward form obtained from work or school

## 2013-05-31 MED ORDER — FERROUS SULFATE 325 (65 FE) MG PO TABS: 325.0000 mg | ORAL_TABLET | Freq: Two times a day (BID) | ORAL | Status: DC

## 2013-05-31 NOTE — Telephone Encounter (Signed)
Patient informed. 

## 2013-05-31 NOTE — Addendum Note (Signed)
Addended by: Sharon Seller B on: 05/31/2013 08:10 AM   Modules accepted: Orders

## 2013-06-04 ENCOUNTER — Encounter (HOSPITAL_COMMUNITY): Payer: Medicare PPO

## 2013-06-07 ENCOUNTER — Encounter (HOSPITAL_COMMUNITY)
Admission: RE | Admit: 2013-06-07 | Discharge: 2013-06-07 | Disposition: A | Discharge status: Home / Self Care | Payer: Medicare Other | Source: Ambulatory Visit | Attending: Nephrology | Admitting: Nephrology

## 2013-06-07 DIAGNOSIS — N184 Chronic kidney disease, stage 4 (severe): Secondary | ICD-10-CM | POA: Insufficient documentation

## 2013-06-07 DIAGNOSIS — D649 Anemia, unspecified: Secondary | ICD-10-CM | POA: Insufficient documentation

## 2013-06-07 MED ORDER — EPOETIN ALFA 20000 UNIT/ML IJ SOLN
INTRAMUSCULAR | Status: AC
  Administered 2013-06-07: 20000 [IU] via SUBCUTANEOUS
  Filled 2013-06-07: qty 1

## 2013-06-07 MED ORDER — EPOETIN ALFA 10000 UNIT/ML IJ SOLN: 20000.0000 [IU] | INTRAMUSCULAR | Status: DC

## 2013-06-11 ENCOUNTER — Other Ambulatory Visit: Payer: Self-pay | Admitting: Internal Medicine

## 2013-06-28 ENCOUNTER — Encounter (HOSPITAL_COMMUNITY)
Admission: RE | Admit: 2013-06-28 | Discharge: 2013-06-28 | Disposition: A | Discharge status: Home / Self Care | Payer: Medicare Other | Source: Ambulatory Visit | Attending: Nephrology | Admitting: Nephrology

## 2013-06-28 DIAGNOSIS — N184 Chronic kidney disease, stage 4 (severe): Secondary | ICD-10-CM | POA: Insufficient documentation

## 2013-06-28 DIAGNOSIS — D649 Anemia, unspecified: Secondary | ICD-10-CM | POA: Insufficient documentation

## 2013-06-28 LAB — POCT HEMOGLOBIN-HEMACUE: Hemoglobin: 11.1 g/dL — ABNORMAL LOW (ref 12.0–15.0)

## 2013-06-28 MED ORDER — EPOETIN ALFA 20000 UNIT/ML IJ SOLN
INTRAMUSCULAR | Status: AC
  Administered 2013-06-28: 20000 [IU] via SUBCUTANEOUS
  Filled 2013-06-28: qty 1

## 2013-06-28 MED ORDER — EPOETIN ALFA 10000 UNIT/ML IJ SOLN: 20000.0000 [IU] | INTRAMUSCULAR | Status: DC

## 2013-07-02 DIAGNOSIS — Z0279 Encounter for issue of other medical certificate: Secondary | ICD-10-CM

## 2013-07-03 ENCOUNTER — Encounter: Payer: Self-pay | Admitting: Internal Medicine

## 2013-07-03 LAB — HM DIABETES EYE EXAM

## 2013-07-12 ENCOUNTER — Other Ambulatory Visit: Payer: Self-pay

## 2013-07-12 ENCOUNTER — Other Ambulatory Visit: Payer: Self-pay | Admitting: *Deleted

## 2013-07-12 MED ORDER — PIOGLITAZONE HCL 45 MG PO TABS: 45.0000 mg | ORAL_TABLET | Freq: Every day | ORAL | Status: DC

## 2013-07-12 MED ORDER — OMEPRAZOLE 20 MG PO CPDR: 20.0000 mg | DELAYED_RELEASE_CAPSULE | Freq: Every day | ORAL | Status: DC

## 2013-07-12 MED ORDER — CLOPIDOGREL BISULFATE 75 MG PO TABS: 75.0000 mg | ORAL_TABLET | Freq: Every day | ORAL | Status: DC

## 2013-07-12 MED ORDER — ATORVASTATIN CALCIUM 80 MG PO TABS: 80.0000 mg | ORAL_TABLET | Freq: Every day | ORAL | Status: DC

## 2013-07-12 MED ORDER — AMLODIPINE BESYLATE 10 MG PO TABS: 10.0000 mg | ORAL_TABLET | Freq: Every day | ORAL | Status: DC

## 2013-07-12 MED ORDER — SITAGLIPTIN PHOSPHATE 100 MG PO TABS: 100.0000 mg | ORAL_TABLET | Freq: Every day | ORAL | Status: DC

## 2013-07-12 MED ORDER — METOPROLOL TARTRATE 100 MG PO TABS: 100.0000 mg | ORAL_TABLET | Freq: Two times a day (BID) | ORAL | Status: DC

## 2013-07-12 MED ORDER — INSULIN GLARGINE 100 UNIT/ML ~~LOC~~ SOLN: 12.0000 [IU] | Freq: Every day | SUBCUTANEOUS | Status: DC

## 2013-07-12 NOTE — Addendum Note (Signed)
Addended by: Sharon Seller B on: 07/12/2013 04:34 PM   Modules accepted: Orders

## 2013-07-19 ENCOUNTER — Encounter (HOSPITAL_COMMUNITY)
Admission: RE | Admit: 2013-07-19 | Discharge: 2013-07-19 | Disposition: A | Discharge status: Home / Self Care | Payer: Medicare Other | Source: Ambulatory Visit | Attending: Nephrology | Admitting: Nephrology

## 2013-07-19 MED ORDER — EPOETIN ALFA 20000 UNIT/ML IJ SOLN
INTRAMUSCULAR | Status: AC
  Administered 2013-07-19: 20000 [IU] via SUBCUTANEOUS
  Filled 2013-07-19: qty 1

## 2013-07-19 MED ORDER — EPOETIN ALFA 10000 UNIT/ML IJ SOLN: 20000.0000 [IU] | INTRAMUSCULAR | Status: DC

## 2013-08-08 ENCOUNTER — Other Ambulatory Visit (HOSPITAL_COMMUNITY): Payer: Self-pay | Admitting: *Deleted

## 2013-08-08 ENCOUNTER — Other Ambulatory Visit: Payer: Self-pay | Admitting: Internal Medicine

## 2013-08-09 ENCOUNTER — Encounter (HOSPITAL_COMMUNITY)
Admission: RE | Admit: 2013-08-09 | Discharge: 2013-08-09 | Disposition: A | Discharge status: Home / Self Care | Payer: Medicare Other | Source: Ambulatory Visit | Attending: Nephrology | Admitting: Nephrology

## 2013-08-09 DIAGNOSIS — N184 Chronic kidney disease, stage 4 (severe): Secondary | ICD-10-CM | POA: Insufficient documentation

## 2013-08-09 DIAGNOSIS — D649 Anemia, unspecified: Secondary | ICD-10-CM | POA: Insufficient documentation

## 2013-08-09 LAB — POCT HEMOGLOBIN-HEMACUE: Hemoglobin: 11.2 g/dL — ABNORMAL LOW (ref 12.0–15.0)

## 2013-08-09 LAB — IRON AND TIBC
Saturation Ratios: 27 % (ref 20–55)
TIBC: 256 ug/dL (ref 250–470)
UIBC: 188 ug/dL (ref 125–400)

## 2013-08-09 MED ORDER — EPOETIN ALFA 20000 UNIT/ML IJ SOLN
INTRAMUSCULAR | Status: AC
  Administered 2013-08-09: 15:00:00 20000 [IU]
  Filled 2013-08-09: qty 1

## 2013-08-09 MED ORDER — EPOETIN ALFA 10000 UNIT/ML IJ SOLN: 20000.0000 [IU] | INTRAMUSCULAR | Status: DC

## 2013-08-29 ENCOUNTER — Other Ambulatory Visit: Payer: Self-pay

## 2013-08-30 ENCOUNTER — Encounter (HOSPITAL_COMMUNITY): Admission: RE | Admit: 2013-08-30 | Payer: Medicare Other | Source: Ambulatory Visit

## 2013-09-03 ENCOUNTER — Encounter (HOSPITAL_COMMUNITY)
Admission: RE | Admit: 2013-09-03 | Discharge: 2013-09-03 | Disposition: A | Discharge status: Home / Self Care | Payer: Medicare Other | Source: Ambulatory Visit | Attending: Nephrology | Admitting: Nephrology

## 2013-09-03 DIAGNOSIS — N184 Chronic kidney disease, stage 4 (severe): Secondary | ICD-10-CM | POA: Insufficient documentation

## 2013-09-03 DIAGNOSIS — D649 Anemia, unspecified: Secondary | ICD-10-CM | POA: Insufficient documentation

## 2013-09-03 LAB — POCT HEMOGLOBIN-HEMACUE: Hemoglobin: 11 g/dL — ABNORMAL LOW (ref 12.0–15.0)

## 2013-09-03 MED ORDER — EPOETIN ALFA 20000 UNIT/ML IJ SOLN
INTRAMUSCULAR | Status: AC
  Administered 2013-09-03: 15:00:00 20000 [IU] via SUBCUTANEOUS
  Filled 2013-09-03: qty 1

## 2013-09-03 MED ORDER — EPOETIN ALFA 10000 UNIT/ML IJ SOLN: 20000.0000 [IU] | INTRAMUSCULAR | Status: DC

## 2013-09-13 ENCOUNTER — Ambulatory Visit (INDEPENDENT_AMBULATORY_CARE_PROVIDER_SITE_OTHER): Payer: Medicare Other | Admitting: Internal Medicine

## 2013-09-13 ENCOUNTER — Encounter: Payer: Self-pay | Admitting: Internal Medicine

## 2013-09-13 VITALS — BP 130/78 | HR 71 | Temp 97.7°F | Ht 61.0 in | Wt 201.0 lb

## 2013-09-13 DIAGNOSIS — Z23 Encounter for immunization: Secondary | ICD-10-CM

## 2013-09-13 DIAGNOSIS — Z0000 Encounter for general adult medical examination without abnormal findings: Secondary | ICD-10-CM

## 2013-09-13 MED ORDER — OMEPRAZOLE 20 MG PO CPDR: 20.0000 mg | DELAYED_RELEASE_CAPSULE | Freq: Every day | ORAL | Status: DC

## 2013-09-13 MED ORDER — AMLODIPINE BESYLATE 10 MG PO TABS: 10.0000 mg | ORAL_TABLET | Freq: Every day | ORAL | Status: DC

## 2013-09-13 MED ORDER — EZETIMIBE 10 MG PO TABS: 10.0000 mg | ORAL_TABLET | Freq: Every day | ORAL | Status: DC

## 2013-09-13 NOTE — Patient Instructions (Signed)
You had the new Prevnar pneumonia shot Please continue all other medications as before, and refills have been done if requested. Please have the pharmacy call with any other refills you may need. No further lab work today Please keep your appointments with your specialists as you have planned - your kidney doctor  Please return in 6 months, or sooner if needed

## 2013-09-13 NOTE — Progress Notes (Signed)
Subjective:    Patient ID: [name], female    DOB: 1946-10-10, 67 y.o.   MRN: [id number]  HPI  Here for wellness and f/u;  Overall doing ok;  Pt denies CP, worsening SOB, DOE, wheezing, orthopnea, PND, worsening LE edema, palpitations, dizziness or syncope.  Pt denies neurological change such as new headache, facial or extremity weakness.  Pt denies polydipsia, polyuria, or low sugar symptoms. Pt states overall good compliance with treatment and medications, good tolerability, and has been trying to follow lower cholesterol diet.  Pt denies worsening depressive symptoms, suicidal ideation or panic. No fever, night sweats, wt loss, loss of appetite, or other constitutional symptoms.  Pt states good ability with ADL's, has low fall risk, home safety reviewed and adequate, no other significant changes in hearing or vision, and only occasionally active with exercise. Past Medical History  Diagnosis Date  . Glaucoma   . Mitral regurgitation   . DM2 (diabetes mellitus, type 2)   . GERD (gastroesophageal reflux disease)   . HLD (hyperlipidemia)   . HTN (hypertension)   . PUD (peptic ulcer disease)   . Anemia   . Carotid stenosis   . Osteoporosis   . PVD (peripheral vascular disease)   . Anemia, iron deficiency   . CAD (coronary artery disease)   . Shortness of breath     with exertion  . Chronic kidney disease     chronic renal failure  . Arthritis    Past Surgical History  Procedure Laterality Date  . Appendectomy    . Hysterectomy - unknown type    . Oophorectomy    . Abdominal hysterectomy    . Av fistula placement Right 01/18/2013    Procedure: ARTERIOVENOUS (AV) FISTULA CREATION;  Surgeon: Ashley Posner, MD;  Location: [facility] OR;  Service: Vascular;  Laterality: Right;  Ultrasound guided    reports that she quit smoking about 5 years ago. She has never used smokeless tobacco. She reports that she does not drink alcohol or use illicit drugs. family history includes Diabetes in  her sister and son; Glaucoma in her father; Heart disease in her father and another family member; Muscular dystrophy in an other family member; Stroke in an other family member. No Known Allergies Current Outpatient Prescriptions on File Prior to Visit  Medication Sig Dispense Refill  . aspirin 325 MG tablet Take 325 mg by mouth daily.       Marland Kitchen atorvastatin (LIPITOR) 80 MG tablet Take 1 tablet (80 mg total) by mouth daily.  90 tablet  1  . calcitRIOL (ROCALTROL) 0.25 MCG capsule Take 0.25 mcg by mouth daily.      . clopidogrel (PLAVIX) 75 MG tablet Take 1 tablet (75 mg total) by mouth daily.  90 tablet  2  . ferrous sulfate 325 (65 FE) MG tablet Take 1 tablet (325 mg total) by mouth 2 (two) times daily.  60 tablet  11  . furosemide (LASIX) 80 MG tablet Take 1 tablet (80 mg total) by mouth 2 (two) times daily.  180 tablet  3  . glimepiride (AMARYL) 4 MG tablet Take 4 mg by mouth daily before breakfast.       . hydrOXYzine (ATARAX/VISTARIL) 25 MG tablet Take 25-50 mg by mouth 3 (three) times daily as needed for itching.      . insulin glargine (LANTUS) 100 UNIT/ML injection Inject 0.12 mLs (12 Units total) into the skin daily.  30 mL  3  . Insulin Pen Needle (BD  PEN NEEDLE NANO U/F) 32G X 4 MM MISC 1 application by Does not apply route daily.  100 each  11  . metoprolol (LOPRESSOR) 100 MG tablet Take 1 tablet (100 mg total) by mouth 2 (two) times daily.  180 tablet  2  . NITROSTAT 0.4 MG SL tablet USE AS DIRECTED  25 tablet  1  . oxyCODONE (ROXICODONE) 5 MG immediate release tablet Take 1 tablet (5 mg total) by mouth every 6 (six) hours as needed for pain.  30 tablet  0  . pioglitazone (ACTOS) 45 MG tablet Take 1 tablet (45 mg total) by mouth daily.  90 tablet  2  . risedronate (ACTONEL) 150 MG tablet TAKE 1 TABLET BY MOUTH ONCE A MONTH-TAKE ON THE SAME DAY OF EACH MONTH.  1 tablet  8  . sitaGLIPtin (JANUVIA) 100 MG tablet Take 1 tablet (100 mg total) by mouth daily.  90 tablet  2  . timolol  (BETIMOL) 0.5 % ophthalmic solution Place 1 drop into both eyes daily.        . timolol (TIMOPTIC) 0.5 % ophthalmic solution at bedtime.      . vitamin B-12 (CYANOCOBALAMIN) 1000 MCG tablet Take 1,000 mcg by mouth daily.       No current facility-administered medications on file prior to visit.   Review of Systems Constitutional: Negative for diaphoresis, activity change, appetite change or unexpected weight change.  HENT: Negative for hearing loss, ear pain, facial swelling, mouth sores and neck stiffness.   Eyes: Negative for pain, redness and visual disturbance.  Respiratory: Negative for shortness of breath and wheezing.   Cardiovascular: Negative for chest pain and palpitations.  Gastrointestinal: Negative for diarrhea, blood in stool, abdominal distention or other pain Genitourinary: Negative for hematuria, flank pain or change in urine volume.  Musculoskeletal: Negative for myalgias and joint swelling.  Skin: Negative for color change and wound.  Neurological: Negative for syncope and numbness. other than noted Hematological: Negative for adenopathy.  Psychiatric/Behavioral: Negative for hallucinations, self-injury, decreased concentration and agitation.      Objective:   Physical Exam BP 130/78  Pulse 71  Temp(Src) 97.7 F (36.5 C) (Oral)  Ht 5\' 1"  (1.549 m)  Wt 201 lb (91.173 kg)  BMI 38.00 kg/m2  SpO2 96% VS noted,  Constitutional: Pt is oriented to person, place, and time. Appears well-developed and well-nourished. [name] Head: Normocephalic and atraumatic.  Right Ear: External ear normal.  Left Ear: External ear normal.  Nose: Nose normal.  Mouth/Throat: Oropharynx is clear and moist.  Eyes: Conjunctivae and EOM are normal. Pupils are equal, round, and reactive to light.  Neck: Normal range of motion. Neck supple. No JVD present. No tracheal deviation present.  Cardiovascular: Normal rate, regular rhythm, normal heart sounds and intact distal pulses.    Pulmonary/Chest: Effort normal and breath sounds normal.  Abdominal: Soft. Bowel sounds are normal. There is no tenderness. No HSM  Musculoskeletal: Normal range of motion. Exhibits trace edema LLE  Lymphadenopathy:  Has no cervical adenopathy.  Neurological: Pt is alert and oriented to person, place, and time. Pt has normal reflexes. No cranial nerve deficit.  Graft with thrill Skin: Skin is warm and dry. No rash noted.  Psychiatric:  Has  normal mood and affect. Behavior is normal.     Assessment & Plan:

## 2013-09-13 NOTE — Progress Notes (Signed)
Pre-visit discussion using our clinic review tool. No additional management support is needed unless otherwise documented below in the visit note.  

## 2013-09-14 NOTE — Assessment & Plan Note (Signed)

## 2013-09-24 ENCOUNTER — Encounter (HOSPITAL_COMMUNITY)
Admission: RE | Admit: 2013-09-24 | Discharge: 2013-09-24 | Disposition: A | Discharge status: Home / Self Care | Payer: Medicare Other | Source: Ambulatory Visit | Attending: Nephrology | Admitting: Nephrology

## 2013-09-24 DIAGNOSIS — N184 Chronic kidney disease, stage 4 (severe): Secondary | ICD-10-CM | POA: Insufficient documentation

## 2013-09-24 DIAGNOSIS — D649 Anemia, unspecified: Secondary | ICD-10-CM | POA: Insufficient documentation

## 2013-09-24 LAB — POCT HEMOGLOBIN-HEMACUE: Hemoglobin: 10.5 g/dL — ABNORMAL LOW (ref 12.0–15.0)

## 2013-09-24 MED ORDER — EPOETIN ALFA 10000 UNIT/ML IJ SOLN: 20000.0000 [IU] | INTRAMUSCULAR | Status: DC

## 2013-09-24 MED ORDER — EPOETIN ALFA 20000 UNIT/ML IJ SOLN
INTRAMUSCULAR | Status: AC
  Administered 2013-09-24: 20000 [IU]
  Filled 2013-09-24: qty 1

## 2013-10-09 ENCOUNTER — Other Ambulatory Visit: Payer: Self-pay | Admitting: Podiatrist

## 2013-10-15 ENCOUNTER — Encounter (HOSPITAL_COMMUNITY)
Admission: RE | Admit: 2013-10-15 | Discharge: 2013-10-15 | Disposition: A | Discharge status: Home / Self Care | Payer: Medicare Other | Source: Ambulatory Visit | Attending: Nephrology | Admitting: Nephrology

## 2013-10-15 MED ORDER — EPOETIN ALFA 10000 UNIT/ML IJ SOLN: 20000.0000 [IU] | INTRAMUSCULAR | Status: DC

## 2013-10-15 MED ORDER — EPOETIN ALFA 20000 UNIT/ML IJ SOLN
INTRAMUSCULAR | Status: AC
  Administered 2013-10-15: 20000 [IU] via SUBCUTANEOUS
  Filled 2013-10-15: qty 1

## 2013-10-28 ENCOUNTER — Other Ambulatory Visit (HOSPITAL_COMMUNITY): Payer: Self-pay | Admitting: *Deleted

## 2013-10-29 ENCOUNTER — Encounter (HOSPITAL_COMMUNITY)
Admission: RE | Admit: 2013-10-29 | Discharge: 2013-10-29 | Disposition: A | Discharge status: Home / Self Care | Payer: Medicare Other | Source: Ambulatory Visit | Attending: Nephrology | Admitting: Nephrology

## 2013-10-29 DIAGNOSIS — N184 Chronic kidney disease, stage 4 (severe): Secondary | ICD-10-CM | POA: Insufficient documentation

## 2013-10-29 DIAGNOSIS — D649 Anemia, unspecified: Secondary | ICD-10-CM | POA: Insufficient documentation

## 2013-10-29 LAB — IRON AND TIBC
IRON: 100 ug/dL (ref 42–135)
SATURATION RATIOS: 35 % (ref 20–55)
TIBC: 288 ug/dL (ref 250–470)
UIBC: 188 ug/dL (ref 125–400)

## 2013-10-29 LAB — FERRITIN: Ferritin: 511 ng/mL — ABNORMAL HIGH (ref 10–291)

## 2013-10-29 LAB — POCT HEMOGLOBIN-HEMACUE: Hemoglobin: 11.3 g/dL — ABNORMAL LOW (ref 12.0–15.0)

## 2013-10-29 MED ORDER — EPOETIN ALFA 20000 UNIT/ML IJ SOLN
INTRAMUSCULAR | Status: AC
  Administered 2013-10-29: 15:00:00 20000 [IU] via SUBCUTANEOUS
  Filled 2013-10-29: qty 1

## 2013-10-29 MED ORDER — EPOETIN ALFA 10000 UNIT/ML IJ SOLN: 20000.0000 [IU] | INTRAMUSCULAR | Status: DC

## 2013-11-05 ENCOUNTER — Encounter (HOSPITAL_COMMUNITY): Payer: Commercial Managed Care - HMO

## 2013-11-11 ENCOUNTER — Other Ambulatory Visit: Payer: Self-pay | Admitting: Podiatrist

## 2013-11-12 ENCOUNTER — Encounter (HOSPITAL_COMMUNITY)
Admission: RE | Admit: 2013-11-12 | Discharge: 2013-11-12 | Disposition: A | Discharge status: Home / Self Care | Payer: Medicare Other | Source: Ambulatory Visit | Attending: Nephrology | Admitting: Nephrology

## 2013-11-12 LAB — POCT HEMOGLOBIN-HEMACUE: HEMOGLOBIN: 11.5 g/dL — AB (ref 12.0–15.0)

## 2013-11-12 MED ORDER — EPOETIN ALFA 10000 UNIT/ML IJ SOLN: 20000.0000 [IU] | INTRAMUSCULAR | Status: DC

## 2013-11-12 MED ORDER — EPOETIN ALFA 20000 UNIT/ML IJ SOLN
INTRAMUSCULAR | Status: AC
  Administered 2013-11-12: 11:00:00 20000 [IU] via SUBCUTANEOUS
  Filled 2013-11-12: qty 1

## 2013-11-26 ENCOUNTER — Encounter (HOSPITAL_COMMUNITY)
Admission: RE | Admit: 2013-11-26 | Discharge: 2013-11-26 | Disposition: A | Discharge status: Home / Self Care | Payer: Medicare PPO | Source: Ambulatory Visit | Attending: Nephrology | Admitting: Nephrology

## 2013-11-26 DIAGNOSIS — N184 Chronic kidney disease, stage 4 (severe): Secondary | ICD-10-CM | POA: Insufficient documentation

## 2013-11-26 DIAGNOSIS — D649 Anemia, unspecified: Secondary | ICD-10-CM | POA: Insufficient documentation

## 2013-11-26 LAB — POCT HEMOGLOBIN-HEMACUE: HEMOGLOBIN: 12.3 g/dL (ref 12.0–15.0)

## 2013-11-26 MED ORDER — EPOETIN ALFA 10000 UNIT/ML IJ SOLN: 20000.0000 [IU] | INTRAMUSCULAR | Status: DC

## 2013-12-10 ENCOUNTER — Inpatient Hospital Stay (HOSPITAL_COMMUNITY): Admission: RE | Admit: 2013-12-10 | Payer: Medicare PPO | Source: Ambulatory Visit

## 2013-12-13 ENCOUNTER — Ambulatory Visit: Payer: Medicare Other | Admitting: Cardiovascular Disease

## 2013-12-17 ENCOUNTER — Other Ambulatory Visit: Payer: Self-pay | Admitting: Podiatrist

## 2013-12-17 ENCOUNTER — Encounter (HOSPITAL_COMMUNITY)
Admission: RE | Admit: 2013-12-17 | Discharge: 2013-12-17 | Disposition: A | Discharge status: Home / Self Care | Payer: Medicare PPO | Source: Ambulatory Visit | Attending: Nephrology | Admitting: Nephrology

## 2013-12-17 LAB — POCT HEMOGLOBIN-HEMACUE: HEMOGLOBIN: 11.3 g/dL — AB (ref 12.0–15.0)

## 2013-12-17 MED ORDER — EPOETIN ALFA 10000 UNIT/ML IJ SOLN
20000.0000 [IU] | INTRAMUSCULAR | Status: DC
  Administered 2013-12-17: 20000 [IU] via SUBCUTANEOUS

## 2013-12-17 MED ORDER — EPOETIN ALFA 20000 UNIT/ML IJ SOLN
INTRAMUSCULAR | Status: AC
  Filled 2013-12-17: qty 1

## 2013-12-18 MED FILL — Epoetin Alfa Inj 20000 Unit/ML: INTRAMUSCULAR | Qty: 1 | Status: AC

## 2013-12-26 ENCOUNTER — Other Ambulatory Visit: Payer: Self-pay | Admitting: Podiatrist

## 2013-12-31 ENCOUNTER — Encounter (HOSPITAL_COMMUNITY)
Admission: RE | Admit: 2013-12-31 | Discharge: 2013-12-31 | Disposition: A | Discharge status: Home / Self Care | Payer: Medicare PPO | Source: Ambulatory Visit | Attending: Nephrology | Admitting: Nephrology

## 2013-12-31 DIAGNOSIS — D649 Anemia, unspecified: Secondary | ICD-10-CM | POA: Insufficient documentation

## 2013-12-31 DIAGNOSIS — N184 Chronic kidney disease, stage 4 (severe): Secondary | ICD-10-CM | POA: Insufficient documentation

## 2013-12-31 LAB — POCT HEMOGLOBIN-HEMACUE: HEMOGLOBIN: 10.8 g/dL — AB (ref 12.0–15.0)

## 2013-12-31 MED ORDER — EPOETIN ALFA 20000 UNIT/ML IJ SOLN
INTRAMUSCULAR | Status: AC
  Administered 2013-12-31: 20000 [IU] via SUBCUTANEOUS
  Filled 2013-12-31: qty 1

## 2013-12-31 MED ORDER — EPOETIN ALFA 10000 UNIT/ML IJ SOLN: 20000.0000 [IU] | INTRAMUSCULAR | Status: DC

## 2013-12-31 MED ORDER — EPOETIN ALFA 10000 UNIT/ML IJ SOLN
INTRAMUSCULAR | Status: AC
  Filled 2013-12-31: qty 1

## 2014-01-10 ENCOUNTER — Ambulatory Visit (INDEPENDENT_AMBULATORY_CARE_PROVIDER_SITE_OTHER): Payer: Commercial Managed Care - HMO | Admitting: Podiatrist

## 2014-01-10 ENCOUNTER — Other Ambulatory Visit: Payer: Self-pay | Admitting: Podiatrist

## 2014-01-10 ENCOUNTER — Encounter: Payer: Self-pay | Admitting: Podiatrist

## 2014-01-10 VITALS — BP 113/53 | HR 56 | Temp 95.8°F | Resp 18

## 2014-01-10 DIAGNOSIS — M1A00X1 Idiopathic chronic gout, unspecified site, with tophus (tophi): Secondary | ICD-10-CM

## 2014-01-10 DIAGNOSIS — M6740 Ganglion, unspecified site: Secondary | ICD-10-CM

## 2014-01-10 DIAGNOSIS — M1A9XX1 Chronic gout, unspecified, with tophus (tophi): Secondary | ICD-10-CM

## 2014-01-10 MED ORDER — ACETAMINOPHEN-CODEINE 300-30 MG PO TABS: 1.0000 | ORAL_TABLET | ORAL | Status: DC | PRN

## 2014-01-10 NOTE — Patient Instructions (Signed)
Gout  Gout is when your joints become red, sore, and swell (inflammed). This is caused by the buildup of uric acid crystals in the joints. Uric acid is a chemical that is normally in the blood. If the level of uric acid gets too high in the blood, these crystals form in your joints and tissues. Over time, these crystals can form into masses near the joints and tissues. These masses can destroy bone and cause the bone to look misshapen (deformed).  HOME CARE   · Do not take aspirin for pain.  · Only take medicine as told by your doctor.  · Rest the joint as much as you can. When in bed, keep sheets and blankets off painful areas.  · Keep the sore joints raised (elevated).  · Put warm or cold packs on painful joints. Use of warm or cold packs depends on which works best for you.  · Use crutches if the painful joint is in your leg.  · Drink enough fluids to keep your pee (urine) clear or pale yellow. Limit alcohol, sugary drinks, and drinks with fructose in them.  · Follow your diet instructions. Pay careful attention to how much protein you eat. Include fruits, vegetables, whole grains, and fat-free or low-fat milk products in your daily diet. Talk to your doctor or dietician about the use of coffee, vitamin C, and cherries. These may help lower uric acid levels.  · Keep a healthy body weight.  GET HELP RIGHT AWAY IF:   · You have watery poop (diarrhea), throw up (vomit), or have any side effects from medicines.  · You do not feel better in 24 hours, or you are getting worse.  · Your joint becomes suddenly more tender, and you have chills or a fever.  MAKE SURE YOU:   · Understand these instructions.  · Will watch your condition.  · Will get help right away if you are not doing well or get worse.  Document Released: 07/19/2008 Document Revised: 02/04/2013 Document Reviewed: 01/18/2010  [employer]® Patient Information ©2014 [employer].

## 2014-01-10 NOTE — Progress Notes (Signed)
   Patient presents today for right foot pain on the lateral aspect. She states it's been sore and tender for the last 3 weeks. She states that knot has developed which become red and hot. She states and there's been no draining and it has sharp pains when she is up and on her foot. She's been unable to wear a normal shoe and presents today in a bedroom slipper.  Objective: Vascular status is intact with symmetric pulses at 1/4 DP and PT bi lateral. A large fluctuant soft tissue lesion is present on the lateral aspect of the right foot which is red and has calor present. 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 Pain is experienced with pressure.  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 The patient agreed and the skin was prepped with alcohol and anesthetized with lidocaine Marcaine mixture and a V. like block. After full anesthesia was achieved aspiration of the area was performed and a whitish fluid was extracted. It does appear to be gouty crystals upon clinical examination. I am going to send in the fluid for synovial and crystalline analysis. I will let her no the result of the culture. She is to continue with compression at the site and Tylenol with Codeine was written for her to take for pain.

## 2014-01-13 LAB — SYNOVIAL FLUID PANEL
EOSINOPHILS-SYNOVIAL: 0 % (ref 0–1)
LYMPHOCYTES-SYNOVIAL FLD: 16 % (ref 0–20)
MONOCYTE/MACROPHAGE: 18 % — AB (ref 50–90)
Neutrophil, Synovial: 66 % — ABNORMAL HIGH (ref 0–25)

## 2014-01-14 ENCOUNTER — Encounter (HOSPITAL_COMMUNITY)
Admission: RE | Admit: 2014-01-14 | Discharge: 2014-01-14 | Disposition: A | Discharge status: Home / Self Care | Payer: Medicare PPO | Source: Ambulatory Visit | Attending: Nephrology | Admitting: Nephrology

## 2014-01-14 ENCOUNTER — Telehealth: Payer: Self-pay | Admitting: *Deleted

## 2014-01-14 DIAGNOSIS — M109 Gout, unspecified: Secondary | ICD-10-CM

## 2014-01-14 LAB — BODY FLUID CULTURE
Gram Stain: NONE SEEN
Organism ID, Bacteria: NO GROWTH

## 2014-01-14 LAB — POCT HEMOGLOBIN-HEMACUE: Hemoglobin: 11.6 g/dL — ABNORMAL LOW (ref 12.0–15.0)

## 2014-01-14 MED ORDER — EPOETIN ALFA 10000 UNIT/ML IJ SOLN: 20000.0000 [IU] | INTRAMUSCULAR | Status: DC

## 2014-01-14 MED ORDER — EPOETIN ALFA 20000 UNIT/ML IJ SOLN
INTRAMUSCULAR | Status: AC
  Administered 2014-01-14: 20000 [IU] via SUBCUTANEOUS
  Filled 2014-01-14: qty 1

## 2014-01-14 NOTE — Telephone Encounter (Signed)
Can you please ask them to do a crystal analysis if they didn't-- I know it was marked but I am particularly wanting to know if it was gout or not?  Thanks!

## 2014-01-14 NOTE — Telephone Encounter (Signed)
We received a sample of Synovial Fluid on her.  We could not complete the study because it was too cloudy and viscous.  We were able to do a culture.  You will see all this in the report.

## 2014-01-14 NOTE — Telephone Encounter (Signed)
Message copied by Lolita Rieger on Tue Jan 14, 2014 11:23 AM ------      Message from: Bronson Ing      Created: Tue Jan 14, 2014 10:40 AM      Regarding: labs       Please let the patient know the cyst I drained is gout from the drainage that was analyzed.  Thanks!                        ----- Message -----         From: [facility] Interface         Sent: 01/14/2014   9:44 AM           To: Trudie Buckler, DPM                   ------

## 2014-01-15 NOTE — Telephone Encounter (Signed)
Per Dr. Valentina Lucks, I called and inform the patient that she has Gout.  She asked when she would see Dr. Valentina Lucks next because her foot is still hurting.  I informed her that Dr. Valentina Lucks is going to refer her to a Rheumatologist, Dr. Ouida Sills.  I sent her to a scheduler to reappoint with Dr Valentina Lucks.

## 2014-01-16 ENCOUNTER — Telehealth: Payer: Self-pay | Admitting: *Deleted

## 2014-01-16 NOTE — Telephone Encounter (Signed)
Referral sent to Dr. Tobie Lords Treat for [address] [place], [place] [address] Phone # [phone]

## 2014-01-22 ENCOUNTER — Telehealth: Payer: Self-pay | Admitting: *Deleted

## 2014-01-22 NOTE — Telephone Encounter (Signed)
I called and informed her that she has an appointment with Dr. Tobie Lords on 02/12/14.  She needs to be there at 9:45am.  I gave her his address and phone number.    [name] [address]  Ph #  [phone]

## 2014-01-27 ENCOUNTER — Other Ambulatory Visit (HOSPITAL_COMMUNITY): Payer: Self-pay | Admitting: *Deleted

## 2014-01-27 ENCOUNTER — Other Ambulatory Visit: Payer: Self-pay | Admitting: Internal Medicine

## 2014-01-28 ENCOUNTER — Encounter (HOSPITAL_COMMUNITY)
Admission: RE | Admit: 2014-01-28 | Discharge: 2014-01-28 | Disposition: A | Discharge status: Home / Self Care | Payer: Medicare PPO | Source: Ambulatory Visit | Attending: Nephrology | Admitting: Nephrology

## 2014-01-28 DIAGNOSIS — N184 Chronic kidney disease, stage 4 (severe): Secondary | ICD-10-CM | POA: Insufficient documentation

## 2014-01-28 DIAGNOSIS — D649 Anemia, unspecified: Secondary | ICD-10-CM | POA: Insufficient documentation

## 2014-01-28 LAB — POCT HEMOGLOBIN-HEMACUE: Hemoglobin: 11.1 g/dL — ABNORMAL LOW (ref 12.0–15.0)

## 2014-01-28 LAB — IRON AND TIBC
Iron: 54 ug/dL (ref 42–135)
SATURATION RATIOS: 31 % (ref 20–55)
TIBC: 176 ug/dL — ABNORMAL LOW (ref 250–470)
UIBC: 122 ug/dL — AB (ref 125–400)

## 2014-01-28 LAB — FERRITIN: Ferritin: 756 ng/mL — ABNORMAL HIGH (ref 10–291)

## 2014-01-28 MED ORDER — EPOETIN ALFA 20000 UNIT/ML IJ SOLN
INTRAMUSCULAR | Status: AC
  Administered 2014-01-28: 20000 [IU] via SUBCUTANEOUS
  Filled 2014-01-28: qty 1

## 2014-01-28 MED ORDER — EPOETIN ALFA 10000 UNIT/ML IJ SOLN: 20000.0000 [IU] | INTRAMUSCULAR | Status: DC

## 2014-01-31 ENCOUNTER — Ambulatory Visit (INDEPENDENT_AMBULATORY_CARE_PROVIDER_SITE_OTHER): Payer: Commercial Managed Care - HMO

## 2014-01-31 ENCOUNTER — Ambulatory Visit (INDEPENDENT_AMBULATORY_CARE_PROVIDER_SITE_OTHER): Payer: Commercial Managed Care - HMO | Admitting: Podiatrist

## 2014-01-31 ENCOUNTER — Encounter: Payer: Self-pay | Admitting: Podiatrist

## 2014-01-31 VITALS — BP 132/63 | HR 57 | Resp 18

## 2014-01-31 DIAGNOSIS — M1A00X1 Idiopathic chronic gout, unspecified site, with tophus (tophi): Secondary | ICD-10-CM

## 2014-01-31 DIAGNOSIS — M25879 Other specified joint disorders, unspecified ankle and foot: Secondary | ICD-10-CM

## 2014-01-31 DIAGNOSIS — R52 Pain, unspecified: Secondary | ICD-10-CM

## 2014-01-31 DIAGNOSIS — M1A9XX1 Chronic gout, unspecified, with tophus (tophi): Secondary | ICD-10-CM

## 2014-01-31 MED ORDER — TRIAMCINOLONE ACETONIDE 10 MG/ML IJ SUSP
10.0000 mg | Freq: Once | INTRAMUSCULAR | Status: AC
  Administered 2014-01-31: 10 mg

## 2014-01-31 MED ORDER — COLCHICINE 0.6 MG PO TABS: 0.6000 mg | ORAL_TABLET | Freq: Every day | ORAL | Status: DC

## 2014-01-31 NOTE — Patient Instructions (Signed)

## 2014-01-31 NOTE — Progress Notes (Signed)
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 She states that today she is now having left foot pain as well. She states her left foot is now swollen and has been hurting her for about a week. She denies taking any medication for the gout even though a prescription for colcrys was written for her.  Objective: Swelling at the first metatarsophalangeal joint left foot is noted. Gouty tophi area continues to be present lateral aspect of the right foot. Both appear to be gout related. X-rays are negative for fracture left foot shows significant swelling at the first metatarsophalangeal joint which is consistent with gout. Neurovascular status is unchanged from the previous visit.  Assessment: Gout bilateral feet  Plan: Injected the first interspace under sterile technique with Kenalog and Marcaine mixture and the patient tolerated this well. The right foot was drained again under sterile technique and was first anesthetized with V like block. The gouty tophi was removed by aspiration and once removed as much as I could I injected 5 mg of Kenalog into the pocket and placed a buttress padding onto the foot. I recommended she followup with rheumatology as her gout appears significant. I also wrote her a prescription for colcrys to hand to the pharmacist  and she will begin taking this immediately.

## 2014-02-11 ENCOUNTER — Encounter (HOSPITAL_COMMUNITY)
Admission: RE | Admit: 2014-02-11 | Discharge: 2014-02-11 | Disposition: A | Discharge status: Home / Self Care | Payer: Medicare PPO | Source: Ambulatory Visit | Attending: Nephrology | Admitting: Nephrology

## 2014-02-11 LAB — POCT HEMOGLOBIN-HEMACUE: Hemoglobin: 10.9 g/dL — ABNORMAL LOW (ref 12.0–15.0)

## 2014-02-11 MED ORDER — EPOETIN ALFA 20000 UNIT/ML IJ SOLN
INTRAMUSCULAR | Status: AC
  Administered 2014-02-11: 20000 [IU] via SUBCUTANEOUS
  Filled 2014-02-11: qty 1

## 2014-02-11 MED ORDER — EPOETIN ALFA 10000 UNIT/ML IJ SOLN: 20000.0000 [IU] | INTRAMUSCULAR | Status: DC

## 2014-02-20 ENCOUNTER — Telehealth: Payer: Self-pay | Admitting: Internal Medicine

## 2014-02-20 NOTE — Telephone Encounter (Signed)
Referral for Dr Fleet Contras faxed to [employer] @ [phone] awaiting approval

## 2014-02-25 ENCOUNTER — Encounter (HOSPITAL_COMMUNITY)
Admission: RE | Admit: 2014-02-25 | Discharge: 2014-02-25 | Disposition: A | Discharge status: Home / Self Care | Payer: Medicare HMO | Source: Ambulatory Visit | Attending: Nephrology | Admitting: Nephrology

## 2014-02-25 DIAGNOSIS — N184 Chronic kidney disease, stage 4 (severe): Secondary | ICD-10-CM | POA: Insufficient documentation

## 2014-02-25 DIAGNOSIS — D649 Anemia, unspecified: Secondary | ICD-10-CM | POA: Insufficient documentation

## 2014-02-25 LAB — POCT HEMOGLOBIN-HEMACUE: Hemoglobin: 10.9 g/dL — ABNORMAL LOW (ref 12.0–15.0)

## 2014-02-25 MED ORDER — EPOETIN ALFA 20000 UNIT/ML IJ SOLN
INTRAMUSCULAR | Status: AC
  Administered 2014-02-25: 20000 [IU] via SUBCUTANEOUS
  Filled 2014-02-25: qty 1

## 2014-02-25 MED ORDER — EPOETIN ALFA 10000 UNIT/ML IJ SOLN: 20000.0000 [IU] | INTRAMUSCULAR | Status: DC

## 2014-03-05 ENCOUNTER — Ambulatory Visit (INDEPENDENT_AMBULATORY_CARE_PROVIDER_SITE_OTHER): Payer: Medicare PPO | Admitting: Physician Assistant

## 2014-03-05 ENCOUNTER — Encounter: Payer: Self-pay | Admitting: Physician Assistant

## 2014-03-05 VITALS — BP 124/62 | HR 62 | Ht 61.0 in | Wt 186.0 lb

## 2014-03-05 DIAGNOSIS — I701 Atherosclerosis of renal artery: Secondary | ICD-10-CM

## 2014-03-05 DIAGNOSIS — I6529 Occlusion and stenosis of unspecified carotid artery: Secondary | ICD-10-CM

## 2014-03-05 DIAGNOSIS — E785 Hyperlipidemia, unspecified: Secondary | ICD-10-CM

## 2014-03-05 DIAGNOSIS — N189 Chronic kidney disease, unspecified: Secondary | ICD-10-CM

## 2014-03-05 DIAGNOSIS — R0602 Shortness of breath: Secondary | ICD-10-CM

## 2014-03-05 DIAGNOSIS — I2510 Atherosclerotic heart disease of native coronary artery without angina pectoris: Secondary | ICD-10-CM

## 2014-03-05 DIAGNOSIS — R079 Chest pain, unspecified: Secondary | ICD-10-CM

## 2014-03-05 DIAGNOSIS — I10 Essential (primary) hypertension: Secondary | ICD-10-CM

## 2014-03-05 MED ORDER — NITROGLYCERIN 0.4 MG SL SUBL: 0.4000 mg | SUBLINGUAL_TABLET | SUBLINGUAL | Status: DC | PRN

## 2014-03-05 MED ORDER — ISOSORBIDE MONONITRATE ER 30 MG PO TB24: 30.0000 mg | ORAL_TABLET | Freq: Every day | ORAL | Status: DC

## 2014-03-05 NOTE — Patient Instructions (Addendum)
START IMDUR 15 MG DAILY (THIS WILL BE 1/2 TAB OF THE 30 MG DAILY)  REFILL SENT IN FOR NTG  Your physician has requested that you have a lexiscan myoview. For further information please visit [email or web address]. Please follow instruction sheet, as given.  A chest x-ray takes a picture of the organs and structures inside the chest, including the heart, lungs, and blood vessels. This test can show several things, including, whether the heart is enlarges; whether fluid is building up in the lungs; and whether pacemaker / defibrillator leads are still in place. Your physician has requested that you have a carotid duplex. This test is an ultrasound of the carotid arteries in your neck. It looks at blood flow through these arteries that supply the brain with blood. Allow one hour for this exam. There are no restrictions or special instructions.  Your physician recommends that you return for lab work in: TODAY BMET, BNP

## 2014-03-05 NOTE — Progress Notes (Signed)
[address] [place], [place]  [address] Phone: [phone] Fax:  [phone]  Date:  03/05/2014   ID:  Jacobie, Lombardozzi 1946/02/05, MRN [id number]  PCP:  Cathlean Cower, MD  Cardiologist:  Dr. Sherren Mocha   Nephrologist:  Dr. Mercy Moore    History of Present Illness: [name] is a 68 y.o. 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  Last seen by Dr. Sherren Mocha in 10/2012.  She returns for follow up. She did have an AV fistula placed. Apparently, this failed. She is not approaching the need for dialysis just yet. She does note a history of chest discomfort described as pressure with activity as well as dyspnea with exertion over the last 2 months. She does not feel that her symptoms are getting worse. Her symptoms are not always brought on by exertion. She describes NYHA class 2b symptoms. She has slept on an incline for years. She denies PND. She has occasional pedal edema. She denies syncope. Of note, she does take nitroglycerin at times for her chest discomfort with relief.   Studies:  - LHC (09/2003):  prox LAD 60%, dist CFX 85%, RCA 60-70%, RV1 marginal branch 60%, EF 40-45%.  PCI:  Taxus DES to mid CFX, PTCA with kissing balloon to dist CFX  - Echo (01/2010):  EF 55-60%, no RWMA, Gr 1 DD, mild to mod MR, mild LAE  - Nuclear (06/2011):  No ischemia, EF 72%, normal study  - Carotid US (03/2013):  Bilateral ICA 40-59% - f/u 1 year  - Renal Art [place] (09/2011):  Probable L Renal Art occlusion; patent R Renal Art  - Abdominal US (03/2003):  No AAA   Recent Labs: 03/13/2013: ALT 20; Creatinine 3.7*; HDL Cholesterol 38.50*; LDL (calc) 84; Potassium 3.5  02/25/2014: Hemoglobin 10.9*   Wt Readings from Last 3 Encounters:  03/05/14 186 lb (84.369 kg)  09/13/13 201 lb (91.173 kg)  03/26/13 197 lb (89.359 kg)      Past Medical History  Diagnosis Date  . Glaucoma   . Mitral regurgitation   . DM2 (diabetes mellitus, type 2)   . GERD (gastroesophageal reflux disease)   . HLD (hyperlipidemia)   . HTN (hypertension)   . PUD (peptic ulcer disease)   . Anemia   . Carotid stenosis   . Osteoporosis   . PVD (peripheral vascular disease)   . Anemia, iron deficiency   . CAD (coronary artery disease)   . Shortness of breath     with exertion  . Chronic kidney disease     chronic renal failure  . Arthritis     Current Outpatient Prescriptions  Medication Sig Dispense Refill  . Acetaminophen-Codeine 300-30 MG per tablet Take 1 tablet by mouth every 4 (four) hours as needed for pain.  30 tablet  0  . amLODipine (NORVASC) 10 MG tablet TAKE ONE (1) TABLET EACH DAY  90 tablet  2  . aspirin 325 MG tablet Take 325 mg by mouth daily.       Marland Kitchen atorvastatin (LIPITOR) 80 MG tablet Take 1 tablet (80 mg total) by mouth daily.  90 tablet  1  . calcitRIOL (ROCALTROL) 0.25 MCG capsule Take 0.25 mcg by mouth daily.      . clopidogrel (PLAVIX) 75 MG tablet Take  1 tablet (75 mg total) by mouth daily.  90 tablet  2  . colchicine (COLCRYS) 0.6 MG tablet Take 1 tablet (0.6 mg total) by mouth daily.  30 tablet  2  . ezetimibe (ZETIA) 10 MG tablet Take 1 tablet (10 mg total) by mouth daily.  90 tablet  3  . ferrous sulfate 325 (65 FE) MG tablet Take 1 tablet (325 mg total) by mouth 2 (two) times daily.  60 tablet  11  . furosemide (LASIX) 80 MG tablet Take 1 tablet (80 mg total) by mouth 2 (two) times daily.  180 tablet  3  . glimepiride (AMARYL) 4 MG tablet Take 4 mg by mouth daily before breakfast.       . hydrOXYzine (ATARAX/VISTARIL) 25 MG tablet Take 25-50 mg by mouth 3 (three) times daily as needed for itching.      . insulin glargine (LANTUS) 100 UNIT/ML injection Inject 0.12 mLs (12 Units total) into the skin daily.  30 mL  3  . Insulin Pen Needle (BD PEN NEEDLE NANO U/F) 32G X 4 MM MISC 1 application by Does not  apply route daily.  100 each  11  . metoprolol (LOPRESSOR) 100 MG tablet Take 1 tablet (100 mg total) by mouth 2 (two) times daily.  180 tablet  2  . NITROSTAT 0.4 MG SL tablet USE AS DIRECTED  25 tablet  1  . omeprazole (PRILOSEC) 20 MG capsule Take 1 capsule (20 mg total) by mouth daily.  90 capsule  3  . oxyCODONE (ROXICODONE) 5 MG immediate release tablet Take 1 tablet (5 mg total) by mouth every 6 (six) hours as needed for pain.  30 tablet  0  . pioglitazone (ACTOS) 45 MG tablet Take 1 tablet (45 mg total) by mouth daily.  90 tablet  2  . risedronate (ACTONEL) 150 MG tablet TAKE 1 TABLET BY MOUTH ONCE A MONTH-TAKE ON THE SAME DAY OF EACH MONTH.  1 tablet  8  . sitaGLIPtin (JANUVIA) 100 MG tablet Take 1 tablet (100 mg total) by mouth daily.  90 tablet  2  . timolol (BETIMOL) 0.5 % ophthalmic solution Place 1 drop into both eyes daily.        . timolol (TIMOPTIC) 0.5 % ophthalmic solution at bedtime.      . vitamin B-12 (CYANOCOBALAMIN) 1000 MCG tablet Take 1,000 mcg by mouth daily.       No current facility-administered medications for this visit.    Allergies:   Review of patient's allergies indicates no known allergies.   Social History:  The patient  reports that she quit smoking about 6 years ago. She has never used smokeless tobacco. She reports that she does not drink alcohol or use illicit drugs.   Family History:  The patient's family history includes Diabetes in her sister and son; Glaucoma in her father; Heart disease in her father and another family member; Muscular dystrophy in an other family member; Stroke in an other family member.   ROS:  Please see the history of present illness.      All other systems reviewed and negative.   PHYSICAL EXAM: VS:  BP 124/62  Pulse 62  Ht 5\' 1"  (1.549 m)  Wt 186 lb (84.369 kg)  BMI 35.16 kg/m2 Well nourished, well developed, in no acute distress HEENT: normal Neck:  Minimally elevated JVD Cardiac:  normal S1, S2; RRR; no  murmur Lungs:  Decreased breath sounds with faint bibasilar crackles Abd: soft, nontender, no hepatomegaly  Ext: no edema Skin: warm and dry Neuro:  CNs 2-12 intact, no focal abnormalities noted  EKG:  NSR, HR 62, ST changes in V3-V6 similar to prior tracings without significant changes     ASSESSMENT AND PLAN:  1. Chest pain, unspecified:  Symptoms are somewhat concerning for exertional angina. They're not consistently with exertion at all times. However, she does have relief with nitroglycerin. Given her chronic kidney disease, she is not likely a good candidate for aggressive, invasive evaluation with cardiac catheterization. I will initiate medical therapy for CAD starting isosorbide 15 mg daily. I will also arrange Lexiscan Myoview for risk stratification. If her nuclear study is high risk, we will need to review this with nephrology to see if proceeding with cardiac catheterization as possible. 2. Shortness of breath: I will also obtain a chest x-ray, basic metabolic panel and BNP. She does not look significantly volume overloaded on exam. However, if she has edema on her chest x-ray or increased BNP, I will adjust her Lasix accordingly. 3. Coronary atherosclerosis of native coronary artery: Continue aspirin, Plavix, statin, beta blocker, amlodipine.  Add nitrates as noted. Proceed with Myoview as noted. 4. CKD (chronic kidney disease): Obtain basic metabolic panel today. 5. Renal artery stenosis: Known left renal artery occlusion. 6. CAROTID ARTERY STENOSIS: Arrange follow up Dopplers for 03/2014. 7. HYPERTENSION: Controlled. 8. HYPERLIPIDEMIA : Continue statin. This is managed by primary care. 9. Disposition: Follow up with Dr. Burt Knack or me in 4 weeks.  Signed, Richardson Dopp, PA-C  03/05/2014 3:50 PM

## 2014-03-06 ENCOUNTER — Telehealth: Payer: Self-pay | Admitting: *Deleted

## 2014-03-06 ENCOUNTER — Ambulatory Visit: Payer: Medicare Other | Admitting: Cardiovascular Disease

## 2014-03-06 LAB — BASIC METABOLIC PANEL
BUN: 54 mg/dL — AB (ref 6–23)
CHLORIDE: 105 meq/L (ref 96–112)
CO2: 22 mEq/L (ref 19–32)
CREATININE: 2.9 mg/dL — AB (ref 0.4–1.2)
Calcium: 8.7 mg/dL (ref 8.4–10.5)
GFR: 20.73 mL/min — ABNORMAL LOW (ref >60.00)
GLUCOSE: 71 mg/dL (ref 70–99)
POTASSIUM: 3.6 meq/L (ref 3.5–5.1)
Sodium: 138 mEq/L (ref 135–145)

## 2014-03-06 LAB — BRAIN NATRIURETIC PEPTIDE: Pro B Natriuretic peptide (BNP): 99 pg/mL (ref 0.0–100.0)

## 2014-03-06 NOTE — Telephone Encounter (Signed)
pt notified about lab results with verbal understanding  

## 2014-03-11 ENCOUNTER — Encounter (HOSPITAL_COMMUNITY): Payer: Medicare PPO

## 2014-03-13 ENCOUNTER — Other Ambulatory Visit (INDEPENDENT_AMBULATORY_CARE_PROVIDER_SITE_OTHER): Payer: Commercial Managed Care - HMO

## 2014-03-13 ENCOUNTER — Ambulatory Visit (INDEPENDENT_AMBULATORY_CARE_PROVIDER_SITE_OTHER): Payer: Commercial Managed Care - HMO | Admitting: Internal Medicine

## 2014-03-13 ENCOUNTER — Encounter: Payer: Self-pay | Admitting: Internal Medicine

## 2014-03-13 VITALS — BP 140/68 | HR 72 | Temp 98.0°F | Ht 61.0 in | Wt 187.5 lb

## 2014-03-13 DIAGNOSIS — E119 Type 2 diabetes mellitus without complications: Secondary | ICD-10-CM

## 2014-03-13 DIAGNOSIS — M109 Gout, unspecified: Secondary | ICD-10-CM

## 2014-03-13 DIAGNOSIS — M79673 Pain in unspecified foot: Secondary | ICD-10-CM

## 2014-03-13 DIAGNOSIS — Z0000 Encounter for general adult medical examination without abnormal findings: Secondary | ICD-10-CM

## 2014-03-13 LAB — LIPID PANEL
CHOLESTEROL: 129 mg/dL (ref 0–200)
HDL: 39.3 mg/dL (ref >39.00)
LDL Cholesterol: 61 mg/dL (ref 0–99)
Total CHOL/HDL Ratio: 3
Triglycerides: 142 mg/dL (ref 0.0–149.0)
VLDL: 28.4 mg/dL (ref 0.0–40.0)

## 2014-03-13 LAB — HEPATIC FUNCTION PANEL
ALT: 37 U/L — ABNORMAL HIGH (ref 0–35)
AST: 31 U/L (ref 0–37)
Albumin: 3.5 g/dL (ref 3.5–5.2)
Alkaline Phosphatase: 103 U/L (ref 39–117)
Bilirubin, Direct: 0.1 mg/dL (ref 0.0–0.3)
Total Bilirubin: 0.5 mg/dL (ref 0.2–1.2)
Total Protein: 7.7 g/dL (ref 6.0–8.3)

## 2014-03-13 LAB — URINALYSIS, ROUTINE W REFLEX MICROSCOPIC
BILIRUBIN URINE: NEGATIVE
Hgb urine dipstick: NEGATIVE
KETONES UR: NEGATIVE
Nitrite: POSITIVE — AB
Specific Gravity, Urine: 1.01 (ref 1.000–1.030)
TOTAL PROTEIN, URINE-UPE24: NEGATIVE
URINE GLUCOSE: NEGATIVE
Urobilinogen, UA: 0.2 (ref 0.0–1.0)
pH: 6 (ref 5.0–8.0)

## 2014-03-13 LAB — CBC WITH DIFFERENTIAL/PLATELET
BASOS PCT: 0.2 % (ref 0.0–3.0)
Basophils Absolute: 0 10*3/uL (ref 0.0–0.1)
EOS PCT: 3.1 % (ref 0.0–5.0)
Eosinophils Absolute: 0.4 10*3/uL (ref 0.0–0.7)
HEMATOCRIT: 34.6 % — AB (ref 36.0–46.0)
Hemoglobin: 11.3 g/dL — ABNORMAL LOW (ref 12.0–15.0)
LYMPHS ABS: 1.7 10*3/uL (ref 0.7–4.0)
Lymphocytes Relative: 12.9 % (ref 12.0–46.0)
MCHC: 32.7 g/dL (ref 30.0–36.0)
MCV: 84.7 fl (ref 78.0–100.0)
MONO ABS: 0.6 10*3/uL (ref 0.1–1.0)
Monocytes Relative: 4.7 % (ref 3.0–12.0)
NEUTROS ABS: 10.3 10*3/uL — AB (ref 1.4–7.7)
Neutrophils Relative %: 79.1 % — ABNORMAL HIGH (ref 43.0–77.0)
PLATELETS: 398 10*3/uL (ref 150.0–400.0)
RBC: 4.09 Mil/uL (ref 3.87–5.11)
RDW: 17.7 % — ABNORMAL HIGH (ref 11.5–15.5)
WBC: 13.1 10*3/uL — AB (ref 4.0–10.5)

## 2014-03-13 LAB — MICROALBUMIN / CREATININE URINE RATIO
Creatinine,U: 62 mg/dL
Microalb Creat Ratio: 1.5 mg/g (ref 0.0–30.0)
Microalb, Ur: 0.9 mg/dL (ref 0.0–1.9)

## 2014-03-13 LAB — TSH: TSH: 2.41 u[IU]/mL (ref 0.35–4.50)

## 2014-03-13 LAB — BASIC METABOLIC PANEL
BUN: 47 mg/dL — AB (ref 6–23)
CO2: 27 mEq/L (ref 19–32)
Calcium: 9.2 mg/dL (ref 8.4–10.5)
Chloride: 103 mEq/L (ref 96–112)
Creatinine, Ser: 2.5 mg/dL — ABNORMAL HIGH (ref 0.4–1.2)
GFR: 24.73 mL/min — ABNORMAL LOW (ref >60.00)
Glucose, Bld: 58 mg/dL — ABNORMAL LOW (ref 70–99)
Potassium: 3.3 mEq/L — ABNORMAL LOW (ref 3.5–5.1)
Sodium: 141 mEq/L (ref 135–145)

## 2014-03-13 LAB — HEMOGLOBIN A1C: Hgb A1c MFr Bld: 5.8 % (ref 4.6–6.5)

## 2014-03-13 NOTE — Assessment & Plan Note (Addendum)

## 2014-03-13 NOTE — Patient Instructions (Addendum)
Please continue all other medications as before, and refills have been done if requested.  Please have the pharmacy call with any other refills you may need.  Please continue your efforts at being more active, low cholesterol diet, and weight control.  Please keep your appointments with your specialists as you have planned  You will be contacted regarding the referral for: podiatry, and rheumatology  Please go to the LAB in the Basement (turn left off the elevator) for the tests to be done today  You will be contacted by phone if any changes need to be made immediately.  Otherwise, you will receive a letter about your results with an explanation, but please check with MyChart first.  Please return in 6 months, or sooner if needed, with Lab testing done 3-5 days before

## 2014-03-13 NOTE — Progress Notes (Signed)
Subjective:    Patient ID: [name], female    DOB: [birth year], 68 y.o.   MRN: [id number]  HPI  Here for wellness and f/u;  Overall doing ok;  Pt denies CP, worsening SOB, DOE, wheezing, orthopnea, PND, worsening LE edema, palpitations, dizziness or syncope.  Pt denies neurological change such as new headache, facial or extremity weakness.  Pt denies polydipsia, polyuria, or low sugar symptoms. Pt states overall good compliance with treatment and medications, good tolerability, and has been trying to follow lower cholesterol diet.  Pt denies worsening depressive symptoms, suicidal ideation or panic. No fever, night sweats, wt loss, loss of appetite, or other constitutional symptoms.  Pt states good ability with ADL's, has low fall risk, home safety reviewed and adequate, no other significant changes in hearing or vision. Scheduled for stress test june 1 per cardiology.  Has recent gout flare, has an appt . Not yet started dialysis, severe CKD still stable enough, sees renal q 2 mo. Past Medical History  Diagnosis Date  . Glaucoma   . Mitral regurgitation   . DM2 (diabetes mellitus, type 2)   . GERD (gastroesophageal reflux disease)   . HLD (hyperlipidemia)   . HTN (hypertension)   . PUD (peptic ulcer disease)   . Anemia   . Carotid stenosis   . Osteoporosis   . PVD (peripheral vascular disease)   . Anemia, iron deficiency   . CAD (coronary artery disease)   . Shortness of breath     with exertion  . Chronic kidney disease     chronic renal failure  . Arthritis    Past Surgical History  Procedure Laterality Date  . Appendectomy    . Hysterectomy - unknown type    . Oophorectomy    . Abdominal hysterectomy    . Av fistula placement Right 01/18/2013    Procedure: ARTERIOVENOUS (AV) FISTULA CREATION;  Surgeon: Rosetta Posner, MD;  Location: [facility] OR;  Service: Vascular;  Laterality: Right;  Ultrasound guided    reports that she quit smoking about 6 years ago. She has never used  smokeless tobacco. She reports that she does not drink alcohol or use illicit drugs. family history includes Diabetes in her sister and son; Glaucoma in her father; Heart disease in her father and another family member; Muscular dystrophy in an other family member; Stroke in an other family member. No Known Allergies Current Outpatient Prescriptions on File Prior to Visit  Medication Sig Dispense Refill  . Acetaminophen-Codeine 300-30 MG per tablet Take 1 tablet by mouth every 4 (four) hours as needed for pain.  30 tablet  0  . amLODipine (NORVASC) 10 MG tablet TAKE ONE (1) TABLET EACH DAY  90 tablet  2  . aspirin 325 MG tablet Take 325 mg by mouth daily.       Marland Kitchen atorvastatin (LIPITOR) 80 MG tablet Take 1 tablet (80 mg total) by mouth daily.  90 tablet  1  . calcitRIOL (ROCALTROL) 0.25 MCG capsule Take 0.25 mcg by mouth daily.      . clopidogrel (PLAVIX) 75 MG tablet Take 1 tablet (75 mg total) by mouth daily.  90 tablet  2  . colchicine (COLCRYS) 0.6 MG tablet Take 1 tablet (0.6 mg total) by mouth daily.  30 tablet  2  . ezetimibe (ZETIA) 10 MG tablet Take 1 tablet (10 mg total) by mouth daily.  90 tablet  3  . ferrous sulfate 325 (65 FE) MG tablet Take 1 tablet (  325 mg total) by mouth 2 (two) times daily.  60 tablet  11  . furosemide (LASIX) 80 MG tablet Take 1 tablet (80 mg total) by mouth 2 (two) times daily.  180 tablet  3  . glimepiride (AMARYL) 4 MG tablet Take 4 mg by mouth daily before breakfast.       . hydrOXYzine (ATARAX/VISTARIL) 25 MG tablet Take 25-50 mg by mouth 3 (three) times daily as needed for itching.      . insulin glargine (LANTUS) 100 UNIT/ML injection Inject 0.12 mLs (12 Units total) into the skin daily.  30 mL  3  . Insulin Pen Needle (BD PEN NEEDLE NANO U/F) 32G X 4 MM MISC 1 application by Does not apply route daily.  100 each  11  . isosorbide mononitrate (IMDUR) 30 MG 24 hr tablet Take 1 tablet (30 mg total) by mouth daily.  30 tablet  11  . metoprolol (LOPRESSOR)  100 MG tablet Take 1 tablet (100 mg total) by mouth 2 (two) times daily.  180 tablet  2  . nitroGLYCERIN (NITROSTAT) 0.4 MG SL tablet Place 1 tablet (0.4 mg total) under the tongue every 5 (five) minutes as needed.  25 tablet  3  . omeprazole (PRILOSEC) 20 MG capsule Take 1 capsule (20 mg total) by mouth daily.  90 capsule  3  . oxyCODONE (ROXICODONE) 5 MG immediate release tablet Take 1 tablet (5 mg total) by mouth every 6 (six) hours as needed for pain.  30 tablet  0  . pioglitazone (ACTOS) 45 MG tablet Take 1 tablet (45 mg total) by mouth daily.  90 tablet  2  . risedronate (ACTONEL) 150 MG tablet TAKE 1 TABLET BY MOUTH ONCE A MONTH-TAKE ON THE SAME DAY OF EACH MONTH.  1 tablet  8  . sitaGLIPtin (JANUVIA) 100 MG tablet Take 1 tablet (100 mg total) by mouth daily.  90 tablet  2  . timolol (BETIMOL) 0.5 % ophthalmic solution Place 1 drop into both eyes daily.        . timolol (TIMOPTIC) 0.5 % ophthalmic solution at bedtime.      . vitamin B-12 (CYANOCOBALAMIN) 1000 MCG tablet Take 1,000 mcg by mouth daily.       No current facility-administered medications on file prior to visit.    Review of Systems Constitutional: Negative for increased diaphoresis, other activity, appetite or other siginficant weight change  HENT: Negative for worsening hearing loss, ear pain, facial swelling, mouth sores and neck stiffness.   Eyes: Negative for other worsening pain, redness or visual disturbance.  Respiratory: Negative for shortness of breath and wheezing.   Cardiovascular: Negative for chest pain and palpitations.  Gastrointestinal: Negative for diarrhea, blood in stool, abdominal distention or other pain Genitourinary: Negative for hematuria, flank pain or change in urine volume.  Musculoskeletal: Negative for myalgias or other joint complaints.  Skin: Negative for color change and wound.  Neurological: Negative for syncope and numbness. other than noted Hematological: Negative for adenopathy. or  other swelling Psychiatric/Behavioral: Negative for hallucinations, self-injury, decreased concentration or other worsening agitation.      Objective:   Physical Exam BP 140/68  Pulse 72  Temp(Src) 98 F (36.7 C) (Oral)  Ht 5\' 1"  (1.549 m)  Wt 187 lb 8 oz (85.049 kg)  BMI 35.45 kg/m2  SpO2 95% VS noted, not ill appearing Constitutional: Pt is oriented to person, place, and time./obese Appears well-developed and well-nourished.  Head: Normocephalic and atraumatic.  Right Ear: External ear  normal.  Left Ear: External ear normal.  Nose: Nose normal.  Mouth/Throat: Oropharynx is clear and moist.  Eyes: Conjunctivae and EOM are normal. Pupils are equal, round, and reactive to light.  Neck: Normal range of motion. Neck supple. No JVD present. No tracheal deviation present.  Cardiovascular: Normal rate, regular rhythm, normal heart sounds and intact distal pulses.   Pulmonary/Chest: Effort normal and breath sounds without rales or wheezing  Abdominal: Soft. Bowel sounds are normal. NT. No HSM  Musculoskeletal: Normal range of motion. Exhibits no edema.  Lymphadenopathy:  Has no cervical adenopathy.  Neurological: Pt is alert and oriented to person, place, and time. Pt has normal reflexes. No cranial nerve deficit. Motor grossly intact Skin: Skin is warm and dry. No rash noted.  Psychiatric:  Has normal mood and affect. Behavior is normal.  + thrill left arm    Assessment & Plan:

## 2014-03-13 NOTE — Progress Notes (Signed)
Pre visit review using our clinic review tool, if applicable. No additional management support is needed unless otherwise documented below in the visit note. 

## 2014-03-18 ENCOUNTER — Other Ambulatory Visit: Payer: Self-pay | Admitting: Internal Medicine

## 2014-03-18 ENCOUNTER — Other Ambulatory Visit (HOSPITAL_COMMUNITY): Payer: Self-pay | Admitting: Cardiology

## 2014-03-18 DIAGNOSIS — I6529 Occlusion and stenosis of unspecified carotid artery: Secondary | ICD-10-CM

## 2014-03-20 ENCOUNTER — Encounter (HOSPITAL_COMMUNITY)
Admission: RE | Admit: 2014-03-20 | Discharge: 2014-03-20 | Disposition: A | Discharge status: Home / Self Care | Payer: Medicare HMO | Source: Ambulatory Visit | Attending: Nephrology | Admitting: Nephrology

## 2014-03-20 LAB — POCT HEMOGLOBIN-HEMACUE: HEMOGLOBIN: 10.1 g/dL — AB (ref 12.0–15.0)

## 2014-03-20 MED ORDER — EPOETIN ALFA 10000 UNIT/ML IJ SOLN: 20000.0000 [IU] | INTRAMUSCULAR | Status: DC

## 2014-03-20 MED ORDER — EPOETIN ALFA 20000 UNIT/ML IJ SOLN
INTRAMUSCULAR | Status: AC
  Administered 2014-03-20: 20000 [IU]
  Filled 2014-03-20: qty 1

## 2014-03-22 ENCOUNTER — Other Ambulatory Visit: Payer: Self-pay | Admitting: Internal Medicine

## 2014-03-24 ENCOUNTER — Encounter: Payer: Self-pay | Admitting: Cardiology

## 2014-03-24 ENCOUNTER — Ambulatory Visit (HOSPITAL_COMMUNITY): Payer: Medicare HMO | Attending: Cardiovascular Disease | Admitting: Cardiology

## 2014-03-24 ENCOUNTER — Ambulatory Visit (HOSPITAL_BASED_OUTPATIENT_CLINIC_OR_DEPARTMENT_OTHER): Payer: Medicare HMO | Admitting: Radiology

## 2014-03-24 ENCOUNTER — Encounter: Payer: Self-pay | Admitting: Physician Assistant

## 2014-03-24 VITALS — BP 143/59 | Ht 61.0 in | Wt 183.0 lb

## 2014-03-24 DIAGNOSIS — R0989 Other specified symptoms and signs involving the circulatory and respiratory systems: Secondary | ICD-10-CM | POA: Insufficient documentation

## 2014-03-24 DIAGNOSIS — R0609 Other forms of dyspnea: Secondary | ICD-10-CM | POA: Insufficient documentation

## 2014-03-24 DIAGNOSIS — R079 Chest pain, unspecified: Secondary | ICD-10-CM | POA: Insufficient documentation

## 2014-03-24 DIAGNOSIS — I6529 Occlusion and stenosis of unspecified carotid artery: Secondary | ICD-10-CM

## 2014-03-24 DIAGNOSIS — I2510 Atherosclerotic heart disease of native coronary artery without angina pectoris: Secondary | ICD-10-CM

## 2014-03-24 DIAGNOSIS — R0602 Shortness of breath: Secondary | ICD-10-CM

## 2014-03-24 MED ORDER — REGADENOSON 0.4 MG/5ML IV SOLN
0.4000 mg | Freq: Once | INTRAVENOUS | Status: AC
  Administered 2014-03-24: 0.4 mg via INTRAVENOUS

## 2014-03-24 MED ORDER — TECHNETIUM TC 99M SESTAMIBI GENERIC - CARDIOLITE
30.0000 | Freq: Once | INTRAVENOUS | Status: AC | PRN
  Administered 2014-03-24: 30 via INTRAVENOUS

## 2014-03-24 MED ORDER — TECHNETIUM TC 99M SESTAMIBI GENERIC - CARDIOLITE
10.0000 | Freq: Once | INTRAVENOUS | Status: AC | PRN
  Administered 2014-03-24: 10 via INTRAVENOUS

## 2014-03-24 NOTE — Progress Notes (Signed)
Carotid duplex complete 

## 2014-03-24 NOTE — Progress Notes (Signed)
[facility] [address] [place], [place] [address] [phone]    Cardiology Nuclear Med Study  [name] is a 68 y.o. female     MRN : [id number]     DOB: 1946/02/09  Procedure Date: 03/24/2014  Nuclear Med Background Indication for Stress Test:  Evaluation for Ischemia, Stent Patency and PTCA Patency History:  CAD-CATH-STENT-PTCA, '11 ECHO: EF: 55-60%, '12 MPI: EF: 72% NL Cardiac Risk Factors: Carotid Disease, Family History - CAD, History of Smoking and Hypertension  Symptoms:  Chest Pain and DOE   Nuclear Pre-Procedure Caffeine/Decaff Intake:  None NPO After: 7:00pm   Lungs:  clear O2 Sat: 96% on room air. IV 0.9% NS with Angio Cath:  22g  IV Site: R Hand  IV Started by:  Matilde Haymaker, RN  Chest Size (in):  42 Cup Size: D  Height: 5\' 1"  (1.549 m)  Weight:  183 lb (83.008 kg)  BMI:  Body mass index is 34.6 kg/(m^2). Tech Comments:  No Lopressor x 36 hrs    Nuclear Med Study 1 or 2 day study: 1 day  Stress Test Type:  Lexiscan  Reading MD: n/a  Order Authorizing Provider:  Christena Deem and Nicki Reaper [facility]  Resting Radionuclide: Technetium 11m Sestamibi  Resting Radionuclide Dose: 11.0 mCi   Stress Radionuclide:  Technetium 40m Sestamibi  Stress Radionuclide Dose: 33.0 mCi           Stress Protocol Rest HR: 63 Stress HR: 90  Rest BP: 143/59 Stress BP: 144/51  Exercise Time (min): n/a METS: n/a   Predicted Max HR: 151 bpm % Max HR: 59.6 bpm Rate Pressure Product: 12960   Dose of Adenosine (mg):  n/a Dose of Lexiscan: 0.4 mg  Dose of Atropine (mg): n/a Dose of Dobutamine: n/a mcg/kg/min (at max HR)  Stress Test Technologist: Perrin Maltese, EMT-P  Nuclear Technologist:  Charlton Amor, CNMT     Rest Procedure:  Myocardial perfusion imaging was performed at rest 45 minutes following the intravenous administration of Technetium 60m Sestamibi. Rest ECG: NSR - Normal EKG  Stress Procedure:  The patient received  IV Lexiscan 0.4 mg over 15-seconds.  Technetium 56m Sestamibi injected at 30-seconds. This patient was sob and lt. Headed with the Lexiscan injection. Quantitative spect images were obtained after a 45 minute delay. Stress ECG: No significant change from baseline ECG  QPS Raw Data Images:  Normal; no motion artifact; normal heart/lung ratio. Stress Images:  Normal homogeneous uptake in all areas of the myocardium. Rest Images:  Normal homogeneous uptake in all areas of the myocardium. Subtraction (SDS):  No evidence of ischemia. Transient Ischemic Dilatation (Normal <1.22):  1.08  Lung/Heart Ratio (Normal <0.45):  0.72  Quantitative Gated Spect Images QGS EDV:  77 ml QGS ESV:  21 ml  Impression Exercise Capacity:  Lexiscan with low level exercise. BP Response:  Normal blood pressure response. Clinical Symptoms:  No significant symptoms noted. ECG Impression:  No significant ST segment change suggestive of ischemia. Comparison with Prior Nuclear Study: No images to compare  Overall Impression:  Normal stress nuclear study.  LV Ejection Fraction: 73%.  LV Wall Motion:  NL LV Function; NL Wall Motion  Darlin Coco MD

## 2014-03-25 ENCOUNTER — Encounter: Payer: Self-pay | Admitting: Physician Assistant

## 2014-03-25 ENCOUNTER — Other Ambulatory Visit: Payer: Self-pay

## 2014-03-25 ENCOUNTER — Telehealth: Payer: Self-pay | Admitting: *Deleted

## 2014-03-25 MED ORDER — ATORVASTATIN CALCIUM 80 MG PO TABS: 80.0000 mg | ORAL_TABLET | Freq: Every day | ORAL | Status: DC

## 2014-03-25 NOTE — Telephone Encounter (Signed)
pt notified about normal myoview results with verbal understanding 

## 2014-03-26 ENCOUNTER — Telehealth: Payer: Self-pay | Admitting: Internal Medicine

## 2014-03-26 DIAGNOSIS — M2550 Pain in unspecified joint: Secondary | ICD-10-CM

## 2014-03-26 NOTE — Telephone Encounter (Signed)
I believe she means rheumatology  [facility] for referral

## 2014-03-26 NOTE — Telephone Encounter (Signed)
Patient informed referral done.

## 2014-03-26 NOTE — Telephone Encounter (Signed)
PT CAME BY TO REQUEST REFERRAL TO NEUROLOGIST: [facility] ON [address]. PLEASE CONTACT PT WHEN THIS REQUEST HAS BEEN COMPLETED.

## 2014-04-03 ENCOUNTER — Encounter (HOSPITAL_COMMUNITY): Payer: Medicare PPO

## 2014-04-07 ENCOUNTER — Ambulatory Visit (INDEPENDENT_AMBULATORY_CARE_PROVIDER_SITE_OTHER): Payer: Medicare PPO | Admitting: Physician Assistant

## 2014-04-07 ENCOUNTER — Encounter: Payer: Self-pay | Admitting: Physician Assistant

## 2014-04-07 ENCOUNTER — Encounter (HOSPITAL_COMMUNITY)
Admission: RE | Admit: 2014-04-07 | Discharge: 2014-04-07 | Disposition: A | Discharge status: Home / Self Care | Payer: Medicare PPO | Source: Ambulatory Visit | Attending: Nephrology | Admitting: Nephrology

## 2014-04-07 VITALS — BP 120/64 | HR 62 | Ht 61.0 in | Wt 185.0 lb

## 2014-04-07 DIAGNOSIS — I6529 Occlusion and stenosis of unspecified carotid artery: Secondary | ICD-10-CM

## 2014-04-07 DIAGNOSIS — D649 Anemia, unspecified: Secondary | ICD-10-CM | POA: Insufficient documentation

## 2014-04-07 DIAGNOSIS — I10 Essential (primary) hypertension: Secondary | ICD-10-CM

## 2014-04-07 DIAGNOSIS — R079 Chest pain, unspecified: Secondary | ICD-10-CM

## 2014-04-07 DIAGNOSIS — R0602 Shortness of breath: Secondary | ICD-10-CM

## 2014-04-07 DIAGNOSIS — N184 Chronic kidney disease, stage 4 (severe): Secondary | ICD-10-CM | POA: Insufficient documentation

## 2014-04-07 DIAGNOSIS — I2510 Atherosclerotic heart disease of native coronary artery without angina pectoris: Secondary | ICD-10-CM

## 2014-04-07 DIAGNOSIS — N189 Chronic kidney disease, unspecified: Secondary | ICD-10-CM

## 2014-04-07 LAB — POCT HEMOGLOBIN-HEMACUE: Hemoglobin: 11.5 g/dL — ABNORMAL LOW (ref 12.0–15.0)

## 2014-04-07 MED ORDER — EPOETIN ALFA 20000 UNIT/ML IJ SOLN
INTRAMUSCULAR | Status: AC
  Filled 2014-04-07: qty 1

## 2014-04-07 MED ORDER — EPOETIN ALFA 10000 UNIT/ML IJ SOLN
20000.0000 [IU] | INTRAMUSCULAR | Status: DC
  Administered 2014-04-07: 20000 [IU] via SUBCUTANEOUS

## 2014-04-07 MED ORDER — ISOSORBIDE MONONITRATE ER 30 MG PO TB24: 60.0000 mg | ORAL_TABLET | Freq: Every day | ORAL | Status: DC

## 2014-04-07 NOTE — Patient Instructions (Signed)
INCREASE IMDUR TO 60 MG DAILY; IF THIS DOES NOT HELP YOUR SYMPTOMS AFTER A FEW WEEKS THEN OK PER SCOTT WEAVER, PAC TO GO BACK TO 30 MG DAILY  Your physician recommends that you schedule a follow-up appointment in: 2-3 MONTHS WITH DR. Burt Knack

## 2014-04-07 NOTE — Progress Notes (Signed)
Cardiology Office Note    Date:  04/07/2014   ID:  [name], [name] 06/18/1946, MRN [id number]  PCP:  Cathlean Cower, MD  Cardiologist:  Dr. Sherren Mocha   Nephrologist:  Dr. Mercy Moore    History of Present Illness: [name] is a 68 y.o. 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    I saw her last month with complaints of exertional chest pain and shortness of breath. BNP was normal. I arranged a Lexiscan Myoview for risk stratification. This demonstrated no ischemia with normal EF. I placed her on isosorbide for medical therapy. She returns for follow up. Her chest discomfort is resolved. She continues to note dyspnea with exertion. She does have a long history of dyspnea with exertion but this worsened several months ago. Her breathing may be somewhat better with nitrate therapy.  She denies orthopnea, PND or edema. She denies syncope.  Studies:  - LHC (09/2003):  prox LAD 60%, dist CFX 85%, RCA 60-70%, RV1 marginal branch 60%, EF 40-45%.  PCI:  Taxus DES to mid CFX, PTCA with kissing balloon to dist CFX  - Echo (01/2010):  EF 55-60%, no RWMA, Gr 1 DD, mild to mod MR, mild LAE  - Lexiscan Myoview (03/24/14):  No ischemia, EF 73%, Normal  - Carotid US (02/2014):  Bilateral ICA 40-59%; > 50% L ECA; F/u 1 year  - Renal Art [place] (09/2011):  Probable L Renal Art occlusion; patent R Renal Art  - Abdominal US (03/2003):  No AAA   Recent Labs: 03/05/2014: Pro B Natriuretic peptide (BNP) 99.0  03/13/2014: ALT 37*; Creatinine 2.5*; HDL Cholesterol by NMR 39.30; LDL (calc) 61; Potassium 3.3*; TSH 2.41  04/07/2014: Hemoglobin 11.5*   Wt Readings from Last 3 Encounters:  04/07/14 185 lb (83.915 kg)  03/24/14 183 lb (83.008 kg)  03/13/14 187 lb 8 oz (85.049 kg)     Past Medical History  Diagnosis Date  . Glaucoma   . Mitral  regurgitation   . DM2 (diabetes mellitus, type 2)   . GERD (gastroesophageal reflux disease)   . HLD (hyperlipidemia)   . HTN (hypertension)   . PUD (peptic ulcer disease)   . Anemia   . Carotid stenosis   . Osteoporosis   . PVD (peripheral vascular disease)   . Anemia, iron deficiency   . CAD (coronary artery disease)   . Shortness of breath     with exertion  . Chronic kidney disease     chronic renal failure  . Arthritis   . Carotid stenosis     Carotid US (02/2014):  Bilateral ICA 40-59%; > 50% L ECA; F/u 1 year  . Hx of cardiovascular stress test     Lexiscan Myoview (03/24/14):  No ischemia, EF 73%, Normal    Current Outpatient Prescriptions  Medication Sig Dispense Refill  . Acetaminophen-Codeine 300-30 MG per tablet Take 1 tablet by mouth every 4 (four) hours as needed for pain.  30 tablet  0  . amLODipine (NORVASC) 10 MG tablet TAKE ONE (1) TABLET EACH DAY  90 tablet  2  . aspirin 81 MG tablet Take 81 mg by mouth daily.      Marland Kitchen atorvastatin (LIPITOR) 80 MG tablet Take 1 tablet (80 mg total) by mouth daily.  90 tablet  1  .  BD PEN NEEDLE NANO U/F 32G X 4 MM MISC USE DAILY WITH LANTUS SOLASTAR  100 each  1  . calcitRIOL (ROCALTROL) 0.25 MCG capsule Take 0.25 mcg by mouth daily.      . clopidogrel (PLAVIX) 75 MG tablet Take 1 tablet (75 mg total) by mouth daily.  90 tablet  2  . colchicine (COLCRYS) 0.6 MG tablet Take 1 tablet (0.6 mg total) by mouth daily.  30 tablet  2  . ezetimibe (ZETIA) 10 MG tablet Take 1 tablet (10 mg total) by mouth daily.  90 tablet  3  . ferrous sulfate 325 (65 FE) MG tablet Take 1 tablet (325 mg total) by mouth 2 (two) times daily.  60 tablet  11  . furosemide (LASIX) 80 MG tablet Take 1 tablet (80 mg total) by mouth 2 (two) times daily.  180 tablet  3  . glimepiride (AMARYL) 4 MG tablet Take 4 mg by mouth daily before breakfast.       . hydrOXYzine (ATARAX/VISTARIL) 25 MG tablet Take 25-50 mg by mouth 3 (three) times daily as needed for itching.       . insulin glargine (LANTUS) 100 UNIT/ML injection Inject 0.12 mLs (12 Units total) into the skin daily.  30 mL  3  . isosorbide mononitrate (IMDUR) 30 MG 24 hr tablet Take 1 tablet (30 mg total) by mouth daily.  30 tablet  11  . LANTUS SOLOSTAR 100 UNIT/ML Solostar Pen INJECT 10 UNITS INTO THE SKIN DAILY  30 mL  4  . metoprolol (LOPRESSOR) 100 MG tablet Take 1 tablet (100 mg total) by mouth 2 (two) times daily.  180 tablet  2  . nitroGLYCERIN (NITROSTAT) 0.4 MG SL tablet Place 1 tablet (0.4 mg total) under the tongue every 5 (five) minutes as needed.  25 tablet  3  . omeprazole (PRILOSEC) 20 MG capsule Take 1 capsule (20 mg total) by mouth daily.  90 capsule  3  . oxyCODONE (ROXICODONE) 5 MG immediate release tablet Take 1 tablet (5 mg total) by mouth every 6 (six) hours as needed for pain.  30 tablet  0  . pioglitazone (ACTOS) 45 MG tablet Take 1 tablet (45 mg total) by mouth daily.  90 tablet  2  . risedronate (ACTONEL) 150 MG tablet TAKE 1 TABLET BY MOUTH ONCE A MONTH-TAKE ON THE SAME DAY OF EACH MONTH.  1 tablet  8  . sitaGLIPtin (JANUVIA) 100 MG tablet Take 1 tablet (100 mg total) by mouth daily.  90 tablet  2  . timolol (BETIMOL) 0.5 % ophthalmic solution Place 1 drop into both eyes daily.       . vitamin B-12 (CYANOCOBALAMIN) 1000 MCG tablet Take 1,000 mcg by mouth daily.       No current facility-administered medications for this visit.   Facility-Administered Medications Ordered in Other Visits  Medication Dose Route Frequency Provider Last Rate Last Dose  . epoetin alfa (EPOGEN,PROCRIT) 96295 UNIT/ML injection           . epoetin alfa (EPOGEN,PROCRIT) injection 20,000 Units  20,000 Units Subcutaneous Q14 Days Windy Kalata, MD   20,000 Units at 04/07/14 1447    Allergies:   Review of patient's allergies indicates no known allergies.   Social History:  The patient  reports that she quit smoking about 6 years ago. She has never used smokeless tobacco. She reports that she does  not drink alcohol or use illicit drugs.   Family History:  The patient's family history  includes Diabetes in her sister and son; Glaucoma in her father; Heart disease in her father and another family member; Muscular dystrophy in an other family member; Stroke in an other family member.   ROS:  Please see the history of present illness.      All other systems reviewed and negative.   PHYSICAL EXAM: VS:  BP 120/64  Pulse 62  Ht 5\' 1"  (1.549 m)  Wt 185 lb (83.915 kg)  BMI 34.97 kg/m2 Well nourished, well developed, in no acute distress HEENT: normal Neck:  no JVDat 90 Cardiac:  normal S1, S2; RRR; no murmur Lungs:  Decreased breath sounds, no rales, no wheezes Abd: soft, nontender, no hepatomegaly Ext: no edema Skin: warm and dry Neuro:  CNs 2-12 intact, no focal abnormalities noted  EKG:  NSR, HR 62, ST changes in V3-V6 similar to prior tracings without significant changes     ASSESSMENT AND PLAN:  1. Chest pain, unspecified:  She had some exertional chest discomfort. She does have a history of known CAD with prior PCI. She has had some response to nitrate therapy. Recent Myoview with low risk. I have recommended continued medical therapy. Given her dyspnea on exertion, I have suggested increasing her isosorbide 60 mg daily. We will continue to monitor for now. She knows to return for sooner follow up should her symptoms worsen. 2. Shortness of breath:  Recent Myoview low risk. BNP was normal. Patient never obtained a chest x-ray but plans to go do this soon. Adjust isosorbide as noted above. I suspect that her dyspnea is, overall, multifactorial. 3. Coronary atherosclerosis of native coronary artery:   Recent Myoview low risk.  Continue aspirin, Plavix, statin, beta blocker, amlodipine, nitrates. 4. CKD (chronic kidney disease): Followup with nephrology as planned. 5. Renal artery stenosis: Known left renal artery occlusion. 6. CAROTID ARTERY STENOSIS:  repeat carotid US  03/2015. 7. HYPERTENSION: Controlled. 8. HYPERLIPIDEMIA : Continue statin. This is managed by primary care. 9. Disposition: Follow up with Dr. Burt Knack in 3 months.  Signed, Versie Starks, MHS 04/07/2014 4:49 PM    [facility] [address], [place], [place]  [address] Phone: [phone]; Fax: [phone]

## 2014-04-10 ENCOUNTER — Encounter: Payer: Self-pay | Admitting: Podiatrist

## 2014-04-10 ENCOUNTER — Ambulatory Visit (INDEPENDENT_AMBULATORY_CARE_PROVIDER_SITE_OTHER): Payer: Commercial Managed Care - HMO | Admitting: Podiatrist

## 2014-04-10 VITALS — BP 159/70 | HR 69 | Resp 12

## 2014-04-10 DIAGNOSIS — M79606 Pain in leg, unspecified: Secondary | ICD-10-CM

## 2014-04-10 DIAGNOSIS — M79609 Pain in unspecified limb: Secondary | ICD-10-CM

## 2014-04-10 DIAGNOSIS — B351 Tinea unguium: Secondary | ICD-10-CM

## 2014-04-12 NOTE — Progress Notes (Signed)
HPI:  Patient presents today for follow up of foot and nail care. Denies any new complaints today. 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    Objective:  Patients chart is reviewed.  Vascular status reveals pedal pulses noted at 1 out of 4 dp and pt bilateral .  Neurological sensation is Normal to [employer] monofilament bilateral.  Patients nails are thickened, discolored, distrophic, friable and brittle with yellow-brown discoloration. Patient subjectively relates they are painful with shoes and with ambulation of bilateral feet.  Assessment:  Symptomatic onychomycosis  Plan:  Discussed treatment options and alternatives.  The symptomatic toenails were debrided through manual an mechanical means without complication.  Return appointment recommended at routine intervals of 3 months    Trudie Buckler, DPM

## 2014-04-21 ENCOUNTER — Encounter (HOSPITAL_COMMUNITY): Payer: Medicare PPO

## 2014-04-24 ENCOUNTER — Other Ambulatory Visit: Payer: Self-pay | Admitting: Internal Medicine

## 2014-04-30 ENCOUNTER — Other Ambulatory Visit (HOSPITAL_COMMUNITY): Payer: Self-pay | Admitting: *Deleted

## 2014-05-01 ENCOUNTER — Encounter (HOSPITAL_COMMUNITY)
Admission: RE | Admit: 2014-05-01 | Discharge: 2014-05-01 | Disposition: A | Discharge status: Home / Self Care | Payer: Medicare PPO | Source: Ambulatory Visit | Attending: Nephrology | Admitting: Nephrology

## 2014-05-01 DIAGNOSIS — N184 Chronic kidney disease, stage 4 (severe): Secondary | ICD-10-CM | POA: Insufficient documentation

## 2014-05-01 DIAGNOSIS — D638 Anemia in other chronic diseases classified elsewhere: Secondary | ICD-10-CM | POA: Insufficient documentation

## 2014-05-01 LAB — IRON AND TIBC
IRON: 83 ug/dL (ref 42–135)
SATURATION RATIOS: 31 % (ref 20–55)
TIBC: 272 ug/dL (ref 250–470)
UIBC: 189 ug/dL (ref 125–400)

## 2014-05-01 LAB — POCT HEMOGLOBIN-HEMACUE: Hemoglobin: 11.5 g/dL — ABNORMAL LOW (ref 12.0–15.0)

## 2014-05-01 LAB — FERRITIN: Ferritin: 544 ng/mL — ABNORMAL HIGH (ref 10–291)

## 2014-05-01 MED ORDER — EPOETIN ALFA 20000 UNIT/ML IJ SOLN
INTRAMUSCULAR | Status: AC
  Administered 2014-05-01: 20000 [IU] via SUBCUTANEOUS
  Filled 2014-05-01: qty 1

## 2014-05-01 MED ORDER — EPOETIN ALFA 10000 UNIT/ML IJ SOLN: 20000.0000 [IU] | INTRAMUSCULAR | Status: DC

## 2014-05-15 ENCOUNTER — Encounter (HOSPITAL_COMMUNITY)
Admission: RE | Admit: 2014-05-15 | Discharge: 2014-05-15 | Disposition: A | Discharge status: Home / Self Care | Payer: Medicare PPO | Source: Ambulatory Visit | Attending: Nephrology | Admitting: Nephrology

## 2014-05-15 DIAGNOSIS — D638 Anemia in other chronic diseases classified elsewhere: Secondary | ICD-10-CM | POA: Diagnosis not present

## 2014-05-15 LAB — POCT HEMOGLOBIN-HEMACUE: HEMOGLOBIN: 13.5 g/dL (ref 12.0–15.0)

## 2014-05-15 MED ORDER — EPOETIN ALFA 20000 UNIT/ML IJ SOLN
INTRAMUSCULAR | Status: AC
  Filled 2014-05-15: qty 1

## 2014-05-15 MED ORDER — EPOETIN ALFA 10000 UNIT/ML IJ SOLN: 20000.0000 [IU] | INTRAMUSCULAR | Status: DC

## 2014-05-16 LAB — POCT HEMOGLOBIN-HEMACUE: Hemoglobin: 14.3 g/dL (ref 12.0–15.0)

## 2014-05-20 ENCOUNTER — Other Ambulatory Visit: Payer: Self-pay | Admitting: Internal Medicine

## 2014-05-29 ENCOUNTER — Encounter (HOSPITAL_COMMUNITY): Payer: Medicare PPO

## 2014-06-04 ENCOUNTER — Encounter (HOSPITAL_COMMUNITY)
Admission: RE | Admit: 2014-06-04 | Discharge: 2014-06-04 | Disposition: A | Discharge status: Home / Self Care | Payer: Medicare PPO | Source: Ambulatory Visit | Attending: Nephrology | Admitting: Nephrology

## 2014-06-04 DIAGNOSIS — D649 Anemia, unspecified: Secondary | ICD-10-CM | POA: Insufficient documentation

## 2014-06-04 DIAGNOSIS — N184 Chronic kidney disease, stage 4 (severe): Secondary | ICD-10-CM | POA: Diagnosis not present

## 2014-06-04 LAB — POCT HEMOGLOBIN-HEMACUE: HEMOGLOBIN: 12.4 g/dL (ref 12.0–15.0)

## 2014-06-04 MED ORDER — EPOETIN ALFA 10000 UNIT/ML IJ SOLN: 20000.0000 [IU] | INTRAMUSCULAR | Status: DC

## 2014-06-07 ENCOUNTER — Other Ambulatory Visit: Payer: Self-pay | Admitting: Internal Medicine

## 2014-06-18 ENCOUNTER — Encounter (HOSPITAL_COMMUNITY)
Admission: RE | Admit: 2014-06-18 | Discharge: 2014-06-18 | Disposition: A | Discharge status: Home / Self Care | Payer: Medicare PPO | Source: Ambulatory Visit | Attending: Nephrology | Admitting: Nephrology

## 2014-06-18 DIAGNOSIS — D649 Anemia, unspecified: Secondary | ICD-10-CM | POA: Diagnosis not present

## 2014-06-18 LAB — POCT HEMOGLOBIN-HEMACUE: Hemoglobin: 11.6 g/dL — ABNORMAL LOW (ref 12.0–15.0)

## 2014-06-18 MED ORDER — EPOETIN ALFA 10000 UNIT/ML IJ SOLN: 20000.0000 [IU] | INTRAMUSCULAR | Status: DC

## 2014-06-18 MED ORDER — EPOETIN ALFA 20000 UNIT/ML IJ SOLN
INTRAMUSCULAR | Status: AC
  Administered 2014-06-18: 20000 [IU]
  Filled 2014-06-18: qty 1

## 2014-07-08 ENCOUNTER — Ambulatory Visit (INDEPENDENT_AMBULATORY_CARE_PROVIDER_SITE_OTHER): Payer: Medicare PPO | Admitting: Cardiovascular Disease

## 2014-07-08 ENCOUNTER — Encounter: Payer: Self-pay | Admitting: Cardiovascular Disease

## 2014-07-08 VITALS — BP 141/72 | HR 65 | Ht 61.0 in | Wt 191.0 lb

## 2014-07-08 DIAGNOSIS — I6529 Occlusion and stenosis of unspecified carotid artery: Secondary | ICD-10-CM

## 2014-07-08 DIAGNOSIS — I2510 Atherosclerotic heart disease of native coronary artery without angina pectoris: Secondary | ICD-10-CM

## 2014-07-08 NOTE — Patient Instructions (Signed)
Your physician wants you to follow-up in: 1 YEAR with Dr Cooper.  You will receive a reminder letter in the mail two months in advance. If you don't receive a letter, please call our office to schedule the follow-up appointment.  Your physician recommends that you continue on your current medications as directed. Please refer to the Current Medication list given to you today.  

## 2014-07-08 NOTE — Progress Notes (Signed)
HPI:  [name] is a 68 y.o. 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 The patient was seen by Richardson Dopp in June and at that time she complained of exertional chest pain and shortness of breath. A Lexiscan Myoview stress test demonstrated no ischemia with normal LV function noted. She was placed on long-acting nitrate therapy. Last lipids from May 2015 show a cholesterol of 129, triglycerides 142, HDL 39, LDL 61.  The patient has been doing well. She reports that her renal function has been stable. She seen Dr. Mercy Moore every 3 months. Blood pressure has been controlled. She denies any recurrence of chest pain or pressure. Her chronic exertional dyspnea is unchanged. She denies edema, orthopnea, or PND. She is avoiding salt. She remains physically limited by arthritis. She ambulates with a cane.  Outpatient Encounter Prescriptions as of 07/08/2014  Medication Sig  . Acetaminophen-Codeine 300-30 MG per tablet Take 1 tablet by mouth every 4 (four) hours as needed for pain.  Marland Kitchen amLODipine (NORVASC) 10 MG tablet TAKE ONE (1) TABLET EACH DAY  . aspirin 81 MG tablet Take 81 mg by mouth daily.  Marland Kitchen atorvastatin (LIPITOR) 80 MG tablet Take 1 tablet (80 mg total) by mouth daily.  . BD PEN NEEDLE NANO U/F 32G X 4 MM MISC USE DAILY WITH LANTUS SOLASTAR  . calcitRIOL (ROCALTROL) 0.25 MCG capsule Take 0.25 mcg by mouth daily.  . clopidogrel (PLAVIX) 75 MG tablet Take 1 tablet (75 mg total) by mouth daily.  . colchicine (COLCRYS) 0.6 MG tablet Take 1 tablet (0.6 mg total) by mouth daily.  Marland Kitchen ezetimibe (ZETIA) 10 MG tablet Take 1 tablet (10 mg total) by mouth daily.  . ferrous sulfate 325 (65 FE) MG tablet TAKE 1 TABLET (325 MG TOTAL) BY MOUTH 2 (TWO) TIMES DAILY.  . furosemide (LASIX) 80 MG tablet Take 1 tablet (80 mg total) by mouth 2 (two)  times daily.  . isosorbide mononitrate (IMDUR) 30 MG 24 hr tablet Take 2 tablets (60 mg total) by mouth daily.  Marland Kitchen JANUVIA 100 MG tablet TAKE 1 TABLET BY MOUTH EVERY DAY  . LANTUS SOLOSTAR 100 UNIT/ML Solostar Pen INJECT 10 UNITS INTO THE SKIN DAILY  . metoprolol (LOPRESSOR) 100 MG tablet TAKE 1 TABLET (100 MG TOTAL) BY MOUTH 2 (TWO) TIMES DAILY.  . nitroGLYCERIN (NITROSTAT) 0.4 MG SL tablet Place 1 tablet (0.4 mg total) under the tongue every 5 (five) minutes as needed.  Marland Kitchen omeprazole (PRILOSEC) 20 MG capsule Take 1 capsule (20 mg total) by mouth daily.  Marland Kitchen oxyCODONE (ROXICODONE) 5 MG immediate release tablet Take 1 tablet (5 mg total) by mouth every 6 (six) hours as needed for pain.  . pioglitazone (ACTOS) 45 MG tablet TAKE 1 TABLET BY MOUTH EVERY DAY  . risedronate (ACTONEL) 150 MG tablet TAKE 1 TABLET BY MOUTH ONCE A MONTH-TAKE ON THE SAME DAY OF EACH MONTH.  . timolol (BETIMOL) 0.5 % ophthalmic solution Place 1 drop into both eyes daily.   . vitamin B-12 (CYANOCOBALAMIN) 1000 MCG tablet Take 1,000 mcg by mouth daily.  . [DISCONTINUED] glimepiride (AMARYL) 4 MG tablet Take 4 mg by mouth daily before breakfast.   . [DISCONTINUED] hydrOXYzine (ATARAX/VISTARIL) 25 MG tablet Take 25-50 mg by mouth 3 (three) times daily as needed for itching.  . [  DISCONTINUED] insulin glargine (LANTUS) 100 UNIT/ML injection Inject 0.12 mLs (12 Units total) into the skin daily.    No Known Allergies  Past Medical History  Diagnosis Date  . Glaucoma   . Mitral regurgitation   . DM2 (diabetes mellitus, type 2)   . GERD (gastroesophageal reflux disease)   . HLD (hyperlipidemia)   . HTN (hypertension)   . PUD (peptic ulcer disease)   . Anemia   . Carotid stenosis   . Osteoporosis   . PVD (peripheral vascular disease)   . Anemia, iron deficiency   . CAD (coronary artery disease)   . Shortness of breath     with exertion  . Chronic kidney disease     chronic renal failure  . Arthritis   . Carotid stenosis      Carotid US (02/2014):  Bilateral ICA 40-59%; > 50% L ECA; F/u 1 year  . Hx of cardiovascular stress test     Lexiscan Myoview (03/24/14):  No ischemia, EF 73%, Normal    ROS: Negative except as per HPI  BP 141/72  Pulse 65  Ht 5\' 1"  (1.549 m)  Wt 191 lb (86.637 kg)  BMI 36.11 kg/m2  PHYSICAL EXAM: Pt is alert and oriented, pleasant obese woman in NAD HEENT: normal Neck: JVP - normal, carotids 2+= with bilateral bruits Lungs: CTA bilaterally CV: RRR without murmur or gallop Abd: soft, NT, Positive BS, no hepatomegaly Ext: no C/C/E, distal pulses intact and equal Skin: warm/dry no rash  ASSESSMENT AND PLAN: 1. Coronary atherosclerosis, native vessel. Patient is status post remote PCI. She's having no anginal symptoms. Recent stress Myoview scan reviewed and this demonstrated no ischemia. She is maintained on dual antiplatelet therapy with aspirin and Plavix. Will continue her same medical program.   2. Hypertension. Blood pressure is controlled on a combination of amlodipine, furosemide, isosorbide, and metoprolol. Not on ACE inhibitor or ARB because of advanced chronic kidney disease and renal atherosclerosis.   3. 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 Medical management.   4. Bilateral carotid artery stenoses. Duplex scan from June 2015 shows a moderate bilateral carotid stenosis. 1 year followup duplex is recommended.  5. Hyperlipidemia. Lipids as above. She takes atorvastatin 80 mg.  I will see her back in one year for followup evaluation.   Sherren Mocha MD 07/08/2014 12:20 PM

## 2014-07-15 ENCOUNTER — Other Ambulatory Visit: Payer: Self-pay

## 2014-07-15 MED ORDER — CLOPIDOGREL BISULFATE 75 MG PO TABS: 75.0000 mg | ORAL_TABLET | Freq: Every day | ORAL | Status: DC

## 2014-07-16 ENCOUNTER — Encounter (HOSPITAL_COMMUNITY)
Admission: RE | Admit: 2014-07-16 | Discharge: 2014-07-16 | Disposition: A | Discharge status: Home / Self Care | Payer: Medicare PPO | Source: Ambulatory Visit | Attending: Nephrology | Admitting: Nephrology

## 2014-07-16 DIAGNOSIS — D649 Anemia, unspecified: Secondary | ICD-10-CM | POA: Diagnosis present

## 2014-07-16 DIAGNOSIS — N184 Chronic kidney disease, stage 4 (severe): Secondary | ICD-10-CM | POA: Diagnosis not present

## 2014-07-16 LAB — POCT HEMOGLOBIN-HEMACUE: Hemoglobin: 11.4 g/dL — ABNORMAL LOW (ref 12.0–15.0)

## 2014-07-16 MED ORDER — EPOETIN ALFA 10000 UNIT/ML IJ SOLN: 20000.0000 [IU] | INTRAMUSCULAR | Status: DC

## 2014-07-16 MED ORDER — EPOETIN ALFA 20000 UNIT/ML IJ SOLN
INTRAMUSCULAR | Status: AC
  Administered 2014-07-16: 20000 [IU] via SUBCUTANEOUS
  Filled 2014-07-16: qty 1

## 2014-07-17 ENCOUNTER — Ambulatory Visit (INDEPENDENT_AMBULATORY_CARE_PROVIDER_SITE_OTHER): Payer: Commercial Managed Care - HMO | Admitting: Podiatrist

## 2014-07-17 DIAGNOSIS — M79676 Pain in unspecified toe(s): Principal | ICD-10-CM

## 2014-07-17 DIAGNOSIS — B351 Tinea unguium: Secondary | ICD-10-CM

## 2014-07-17 DIAGNOSIS — M79609 Pain in unspecified limb: Secondary | ICD-10-CM

## 2014-07-21 ENCOUNTER — Ambulatory Visit: Payer: Medicare PPO | Admitting: Cardiovascular Disease

## 2014-07-21 NOTE — Progress Notes (Signed)
HPI: Patient presents today for follow up of foot and nail care. Denies any new complaints today. 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  Objective: Patients chart is reviewed. Vascular status reveals pedal pulses noted at 1 out of 4 dp and pt bilateral . Neurological sensation is Normal to [employer] monofilament bilateral. Patients nails are thickened, discolored, distrophic, friable and brittle with yellow-brown discoloration. Patient subjectively relates they are painful with shoes and with ambulation of bilateral feet.  Assessment: Symptomatic onychomycosis  Plan: Discussed treatment options and alternatives. The symptomatic toenails were debrided through manual an mechanical means without complication. Return appointment recommended at routine intervals of 3 months  Trudie Buckler, DPM

## 2014-08-11 ENCOUNTER — Other Ambulatory Visit: Payer: Self-pay

## 2014-08-11 MED ORDER — RISEDRONATE SODIUM 150 MG PO TABS: ORAL_TABLET | ORAL | Status: DC

## 2014-08-13 ENCOUNTER — Inpatient Hospital Stay (HOSPITAL_COMMUNITY): Admission: RE | Admit: 2014-08-13 | Payer: Medicare PPO | Source: Ambulatory Visit

## 2014-08-15 ENCOUNTER — Encounter (HOSPITAL_COMMUNITY)
Admission: RE | Admit: 2014-08-15 | Discharge: 2014-08-15 | Disposition: A | Discharge status: Home / Self Care | Payer: Commercial Managed Care - HMO | Source: Ambulatory Visit | Attending: Nephrology | Admitting: Nephrology

## 2014-08-15 DIAGNOSIS — D631 Anemia in chronic kidney disease: Secondary | ICD-10-CM | POA: Diagnosis not present

## 2014-08-15 DIAGNOSIS — N184 Chronic kidney disease, stage 4 (severe): Secondary | ICD-10-CM | POA: Diagnosis not present

## 2014-08-15 LAB — IRON AND TIBC
Iron: 112 ug/dL (ref 42–135)
Saturation Ratios: 39 % (ref 20–55)
TIBC: 290 ug/dL (ref 250–470)
UIBC: 178 ug/dL (ref 125–400)

## 2014-08-15 LAB — POCT HEMOGLOBIN-HEMACUE: Hemoglobin: 11.8 g/dL — ABNORMAL LOW (ref 12.0–15.0)

## 2014-08-15 MED ORDER — EPOETIN ALFA 20000 UNIT/ML IJ SOLN
INTRAMUSCULAR | Status: AC
  Administered 2014-08-15: 20000 [IU] via SUBCUTANEOUS
  Filled 2014-08-15: qty 1

## 2014-08-15 MED ORDER — EPOETIN ALFA 10000 UNIT/ML IJ SOLN: 20000.0000 [IU] | INTRAMUSCULAR | Status: DC

## 2014-08-16 LAB — FERRITIN: Ferritin: 720 ng/mL — ABNORMAL HIGH (ref 10–291)

## 2014-08-20 ENCOUNTER — Other Ambulatory Visit: Payer: Self-pay | Admitting: Nurse Practitioner

## 2014-09-11 ENCOUNTER — Encounter: Payer: Self-pay | Admitting: Internal Medicine

## 2014-09-11 ENCOUNTER — Ambulatory Visit (INDEPENDENT_AMBULATORY_CARE_PROVIDER_SITE_OTHER): Payer: Commercial Managed Care - HMO | Admitting: Internal Medicine

## 2014-09-11 VITALS — BP 160/82 | HR 71 | Temp 97.6°F | Ht 61.0 in | Wt 192.8 lb

## 2014-09-11 DIAGNOSIS — I10 Essential (primary) hypertension: Secondary | ICD-10-CM

## 2014-09-11 DIAGNOSIS — E119 Type 2 diabetes mellitus without complications: Secondary | ICD-10-CM

## 2014-09-11 DIAGNOSIS — E785 Hyperlipidemia, unspecified: Secondary | ICD-10-CM

## 2014-09-11 DIAGNOSIS — Z0000 Encounter for general adult medical examination without abnormal findings: Secondary | ICD-10-CM

## 2014-09-11 DIAGNOSIS — Z0189 Encounter for other specified special examinations: Secondary | ICD-10-CM

## 2014-09-11 NOTE — Patient Instructions (Signed)
Please continue all other medications as before, and refills have been done if requested.  Please have the pharmacy call with any other refills you may need.  Please continue your efforts at being more active, low cholesterol diet, and weight control.  You are otherwise up to date with prevention measures today.  Please keep your appointments with your specialists as you may have planned  Please return in 6 months, or sooner if needed, with Lab testing done 3-5 days before  

## 2014-09-11 NOTE — Progress Notes (Signed)
Pre visit review using our clinic review tool, if applicable. No additional management support is needed unless otherwise documented below in the visit note. 

## 2014-09-11 NOTE — Assessment & Plan Note (Signed)
stable overall by history and exam, recent data reviewed with pt, and pt to continue medical treatment as before,  to f/u any worsening symptoms or concerns Lab Results  Component Value Date   LDLCALC 61 03/13/2014    

## 2014-09-11 NOTE — Assessment & Plan Note (Signed)
Mild elev today, has not taken BP med this am yet, o/w stable overall by history and exam, recent data reviewed with pt, and pt to continue medical treatment as before,  to f/u any worsening symptoms or concerns BP Readings from Last 3 Encounters:  09/11/14 160/82  08/15/14 129/89  07/16/14 117/81

## 2014-09-11 NOTE — Progress Notes (Signed)
Subjective:    Patient ID: [name], female    DOB: Nov 16, 1945, 68 y.o.   MRN: [id number]  HPI  Here to f/u; overall doing ok,  Pt denies chest pain, increased sob or doe, wheezing, orthopnea, PND, increased LE swelling, palpitations, dizziness or syncope.  Pt denies polydipsia, polyuria, or low sugar symptoms such as weakness or confusion improved with po intake.  Pt denies new neurological symptoms such as new headache, or facial or extremity weakness or numbness.   Pt states overall good compliance with meds, has been trying to follow lower cholesterol, diabetic diet, with wt overall stable,  but little exercise however.  Sees renal with labs q 3 mo now, used to be every 2 mo.  Recent stress test neg for ischemia June 2015.  No new complaints Past Medical History  Diagnosis Date  . Glaucoma   . Mitral regurgitation   . DM2 (diabetes mellitus, type 2)   . GERD (gastroesophageal reflux disease)   . HLD (hyperlipidemia)   . HTN (hypertension)   . PUD (peptic ulcer disease)   . Anemia   . Carotid stenosis   . Osteoporosis   . PVD (peripheral vascular disease)   . Anemia, iron deficiency   . CAD (coronary artery disease)   . Shortness of breath     with exertion  . Chronic kidney disease     chronic renal failure  . Arthritis   . Carotid stenosis     Carotid US (02/2014):  Bilateral ICA 40-59%; > 50% L ECA; F/u 1 year  . Hx of cardiovascular stress test     Lexiscan Myoview (03/24/14):  No ischemia, EF 73%, Normal   Past Surgical History  Procedure Laterality Date  . Appendectomy    . Hysterectomy - unknown type    . Oophorectomy    . Abdominal hysterectomy    . Av fistula placement Right 01/18/2013    Procedure: ARTERIOVENOUS (AV) FISTULA CREATION;  Surgeon: Rosetta Posner, MD;  Location: [facility] OR;  Service: Vascular;  Laterality: Right;  Ultrasound guided    reports that she quit smoking about 6 years ago. She has never used smokeless tobacco. She reports that she does not  drink alcohol or use illicit drugs. family history includes Diabetes in her sister and son; Glaucoma in her father; Heart disease in her father and another family member; Muscular dystrophy in an other family member; Stroke in an other family member. No Known Allergies Current Outpatient Prescriptions on File Prior to Visit  Medication Sig Dispense Refill  . Acetaminophen-Codeine 300-30 MG per tablet Take 1 tablet by mouth every 4 (four) hours as needed for pain. 30 tablet 0  . amLODipine (NORVASC) 10 MG tablet TAKE ONE (1) TABLET EACH DAY 90 tablet 2  . aspirin 81 MG tablet Take 81 mg by mouth daily.    Marland Kitchen atorvastatin (LIPITOR) 80 MG tablet Take 1 tablet (80 mg total) by mouth daily. 90 tablet 1  . BD PEN NEEDLE NANO U/F 32G X 4 MM MISC USE DAILY WITH LANTUS SOLASTAR 100 each 11  . calcitRIOL (ROCALTROL) 0.25 MCG capsule Take 0.25 mcg by mouth daily.    . clopidogrel (PLAVIX) 75 MG tablet Take 1 tablet (75 mg total) by mouth daily. 90 tablet 2  . colchicine (COLCRYS) 0.6 MG tablet Take 1 tablet (0.6 mg total) by mouth daily. 30 tablet 2  . ezetimibe (ZETIA) 10 MG tablet Take 1 tablet (10 mg total) by mouth daily. [place]  tablet 3  . ferrous sulfate 325 (65 FE) MG tablet TAKE 1 TABLET (325 MG TOTAL) BY MOUTH 2 (TWO) TIMES DAILY. 60 tablet 11  . furosemide (LASIX) 80 MG tablet Take 1 tablet (80 mg total) by mouth 2 (two) times daily. 180 tablet 3  . isosorbide mononitrate (IMDUR) 30 MG 24 hr tablet Take 2 tablets (60 mg total) by mouth daily. 60 tablet 11  . JANUVIA 100 MG tablet TAKE 1 TABLET BY MOUTH EVERY DAY 90 tablet 3  . LANTUS SOLOSTAR 100 UNIT/ML Solostar Pen INJECT 10 UNITS INTO THE SKIN DAILY 30 mL 4  . metoprolol (LOPRESSOR) 100 MG tablet TAKE 1 TABLET (100 MG TOTAL) BY MOUTH 2 (TWO) TIMES DAILY. 180 tablet 2  . nitroGLYCERIN (NITROSTAT) 0.4 MG SL tablet Place 1 tablet (0.4 mg total) under the tongue every 5 (five) minutes as needed. 25 tablet 3  . omeprazole (PRILOSEC) 20 MG capsule Take 1  capsule (20 mg total) by mouth daily. 90 capsule 3  . oxyCODONE (ROXICODONE) 5 MG immediate release tablet Take 1 tablet (5 mg total) by mouth every 6 (six) hours as needed for pain. 30 tablet 0  . pioglitazone (ACTOS) 45 MG tablet TAKE 1 TABLET BY MOUTH EVERY DAY 90 tablet 2  . risedronate (ACTONEL) 150 MG tablet TAKE 1 TABLET BY MOUTH ONCE A MONTH-TAKE ON THE SAME DAY OF EACH MONTH. 1 tablet 5  . timolol (BETIMOL) 0.5 % ophthalmic solution Place 1 drop into both eyes daily.     . vitamin B-12 (CYANOCOBALAMIN) 1000 MCG tablet Take 1,000 mcg by mouth daily.     No current facility-administered medications on file prior to visit.     Review of Systems  Constitutional: Negative for unusual diaphoresis or other sweats  HENT: Negative for ringing in ear Eyes: Negative for double vision or worsening visual disturbance.  Respiratory: Negative for choking and stridor.   Gastrointestinal: Negative for vomiting or other signifcant bowel change Genitourinary: Negative for hematuria or decreased urine volume.  Musculoskeletal: Negative for other MSK pain or swelling Skin: Negative for color change and worsening wound.  Neurological: Negative for tremors and numbness other than noted  Psychiatric/Behavioral: Negative for decreased concentration or agitation other than above       Objective:   Physical Exam BP 160/82 mmHg  Pulse 71  Temp(Src) 97.6 F (36.4 C) (Oral)  Ht 5\' 1"  (1.549 m)  Wt 192 lb 12 oz (87.431 kg)  BMI 36.44 kg/m2  SpO2 96% VS noted, walks with cane, vision impaired Constitutional: Pt appears well-developed, well-nourished.  HENT: Head: NCAT.  Right Ear: External ear normal.  Left Ear: External ear normal.  Eyes: . Pupils are equal, round, and reactive to light. Conjunctivae and EOM are normal Neck: Normal range of motion. Neck supple.  Cardiovascular: Normal rate and regular rhythm.   Pulmonary/Chest: Effort normal and breath sounds normal.  Abd:  Soft, NT, ND, +  BS Neurological: Pt is alert. Not confused , motor grossly intact Skin: Skin is warm. No rash Psychiatric: Pt behavior is normal. No agitation.     Assessment & Plan:

## 2014-09-11 NOTE — Assessment & Plan Note (Signed)
stable overall by history and exam, recent data reviewed with pt, and pt to continue medical treatment as before,  to f/u any worsening symptoms or concerns Lab Results  Component Value Date   HGBA1C 5.8 03/13/2014   Ok to hold on f/u a1c today, will need next visit

## 2014-09-12 ENCOUNTER — Encounter (HOSPITAL_COMMUNITY)
Admission: RE | Admit: 2014-09-12 | Discharge: 2014-09-12 | Disposition: A | Discharge status: Home / Self Care | Payer: Commercial Managed Care - HMO | Source: Ambulatory Visit | Attending: Nephrology | Admitting: Nephrology

## 2014-09-12 ENCOUNTER — Telehealth: Payer: Self-pay | Admitting: Internal Medicine

## 2014-09-12 DIAGNOSIS — D631 Anemia in chronic kidney disease: Secondary | ICD-10-CM | POA: Diagnosis present

## 2014-09-12 DIAGNOSIS — N184 Chronic kidney disease, stage 4 (severe): Secondary | ICD-10-CM | POA: Insufficient documentation

## 2014-09-12 LAB — POCT HEMOGLOBIN-HEMACUE: Hemoglobin: 12.8 g/dL (ref 12.0–15.0)

## 2014-09-12 MED ORDER — EPOETIN ALFA 10000 UNIT/ML IJ SOLN: 20000.0000 [IU] | INTRAMUSCULAR | Status: DC

## 2014-09-12 NOTE — Telephone Encounter (Signed)
emmi mailed  °

## 2014-09-17 ENCOUNTER — Telehealth: Payer: Self-pay | Admitting: Internal Medicine

## 2014-09-17 DIAGNOSIS — H543 Unqualified visual loss, both eyes: Secondary | ICD-10-CM

## 2014-09-17 NOTE — Telephone Encounter (Signed)
Pt has appt with Dr Derenda Mis / [facility] on Dec 3. They suggested pt call her PCP to get a referral for this appt. ([employer])

## 2014-09-17 NOTE — Telephone Encounter (Signed)
Referral done

## 2014-09-18 ENCOUNTER — Other Ambulatory Visit: Payer: Self-pay | Admitting: Cardiovascular Disease

## 2014-09-22 ENCOUNTER — Other Ambulatory Visit: Payer: Self-pay | Admitting: Cardiovascular Disease

## 2014-09-24 LAB — HM DIABETES EYE EXAM

## 2014-09-26 ENCOUNTER — Encounter (HOSPITAL_COMMUNITY)
Admission: RE | Admit: 2014-09-26 | Discharge: 2014-09-26 | Disposition: A | Discharge status: Home / Self Care | Payer: Commercial Managed Care - HMO | Source: Ambulatory Visit | Attending: Nephrology | Admitting: Nephrology

## 2014-09-26 DIAGNOSIS — D631 Anemia in chronic kidney disease: Secondary | ICD-10-CM | POA: Insufficient documentation

## 2014-09-26 DIAGNOSIS — N184 Chronic kidney disease, stage 4 (severe): Secondary | ICD-10-CM | POA: Insufficient documentation

## 2014-09-26 LAB — POCT HEMOGLOBIN-HEMACUE: Hemoglobin: 13.4 g/dL (ref 12.0–15.0)

## 2014-09-26 MED ORDER — EPOETIN ALFA 10000 UNIT/ML IJ SOLN: 20000.0000 [IU] | INTRAMUSCULAR | Status: DC

## 2014-10-01 ENCOUNTER — Encounter (HOSPITAL_COMMUNITY): Payer: Self-pay | Admitting: Cardiovascular Disease

## 2014-10-06 ENCOUNTER — Encounter: Payer: Self-pay | Admitting: Internal Medicine

## 2014-10-10 ENCOUNTER — Encounter (HOSPITAL_COMMUNITY): Payer: Medicare PPO

## 2014-10-12 ENCOUNTER — Other Ambulatory Visit: Payer: Self-pay | Admitting: Internal Medicine

## 2014-10-28 ENCOUNTER — Encounter (HOSPITAL_COMMUNITY)
Admission: RE | Admit: 2014-10-28 | Discharge: 2014-10-28 | Disposition: A | Discharge status: Home / Self Care | Payer: Commercial Managed Care - HMO | Source: Ambulatory Visit | Attending: Nephrology | Admitting: Nephrology

## 2014-10-28 DIAGNOSIS — D631 Anemia in chronic kidney disease: Secondary | ICD-10-CM | POA: Diagnosis present

## 2014-10-28 DIAGNOSIS — N184 Chronic kidney disease, stage 4 (severe): Secondary | ICD-10-CM | POA: Insufficient documentation

## 2014-10-28 LAB — IRON AND TIBC
Iron: 106 ug/dL (ref 42–145)
Saturation Ratios: 34 % (ref 20–55)
TIBC: 314 ug/dL (ref 250–470)
UIBC: 208 ug/dL (ref 125–400)

## 2014-10-28 LAB — FERRITIN: FERRITIN: 681 ng/mL — AB (ref 10–291)

## 2014-10-28 LAB — POCT HEMOGLOBIN-HEMACUE: HEMOGLOBIN: 11.7 g/dL — AB (ref 12.0–15.0)

## 2014-10-28 MED ORDER — EPOETIN ALFA 20000 UNIT/ML IJ SOLN
INTRAMUSCULAR | Status: AC
  Administered 2014-10-28: 20000 [IU] via SUBCUTANEOUS
  Filled 2014-10-28: qty 1

## 2014-10-28 MED ORDER — EPOETIN ALFA 10000 UNIT/ML IJ SOLN: 20000.0000 [IU] | INTRAMUSCULAR | Status: DC

## 2014-10-31 ENCOUNTER — Telehealth: Payer: Self-pay | Admitting: Internal Medicine

## 2014-10-31 DIAGNOSIS — M109 Gout, unspecified: Secondary | ICD-10-CM

## 2014-10-31 NOTE — Telephone Encounter (Signed)
Pt called stating she needs a referral to Dr [facility] [phone], for her gout.

## 2014-10-31 NOTE — Telephone Encounter (Signed)
Referral done

## 2014-11-19 ENCOUNTER — Other Ambulatory Visit: Payer: Self-pay | Admitting: Internal Medicine

## 2014-12-02 ENCOUNTER — Inpatient Hospital Stay (HOSPITAL_COMMUNITY): Admission: RE | Admit: 2014-12-02 | Payer: Medicare PPO | Source: Ambulatory Visit

## 2014-12-05 ENCOUNTER — Encounter (HOSPITAL_COMMUNITY)
Admission: RE | Admit: 2014-12-05 | Discharge: 2014-12-05 | Disposition: A | Discharge status: Home / Self Care | Payer: Commercial Managed Care - HMO | Source: Ambulatory Visit | Attending: Nephrology | Admitting: Nephrology

## 2014-12-05 DIAGNOSIS — D631 Anemia in chronic kidney disease: Secondary | ICD-10-CM | POA: Insufficient documentation

## 2014-12-05 DIAGNOSIS — N184 Chronic kidney disease, stage 4 (severe): Secondary | ICD-10-CM | POA: Diagnosis not present

## 2014-12-05 LAB — POCT HEMOGLOBIN-HEMACUE: Hemoglobin: 11.9 g/dL — ABNORMAL LOW (ref 12.0–15.0)

## 2014-12-05 MED ORDER — EPOETIN ALFA 10000 UNIT/ML IJ SOLN
20000.0000 [IU] | INTRAMUSCULAR | Status: DC
  Administered 2014-12-05: 20000 [IU] via SUBCUTANEOUS

## 2014-12-06 MED ORDER — EPOETIN ALFA 20000 UNIT/ML IJ SOLN
INTRAMUSCULAR | Status: AC
  Filled 2014-12-06: qty 1

## 2014-12-08 MED FILL — Epoetin Alfa Inj 20000 Unit/ML: INTRAMUSCULAR | Qty: 1 | Status: AC

## 2014-12-18 ENCOUNTER — Other Ambulatory Visit: Payer: Self-pay | Admitting: Internal Medicine

## 2015-01-01 ENCOUNTER — Other Ambulatory Visit (HOSPITAL_COMMUNITY): Payer: Self-pay | Admitting: *Deleted

## 2015-01-02 ENCOUNTER — Encounter (HOSPITAL_COMMUNITY)
Admission: RE | Admit: 2015-01-02 | Discharge: 2015-01-02 | Disposition: A | Discharge status: Home / Self Care | Payer: Commercial Managed Care - HMO | Source: Ambulatory Visit | Attending: Nephrology | Admitting: Nephrology

## 2015-01-02 DIAGNOSIS — N184 Chronic kidney disease, stage 4 (severe): Secondary | ICD-10-CM | POA: Insufficient documentation

## 2015-01-02 DIAGNOSIS — D631 Anemia in chronic kidney disease: Secondary | ICD-10-CM | POA: Insufficient documentation

## 2015-01-02 LAB — POCT HEMOGLOBIN-HEMACUE: HEMOGLOBIN: 12.4 g/dL (ref 12.0–15.0)

## 2015-01-02 MED ORDER — EPOETIN ALFA 10000 UNIT/ML IJ SOLN: 20000.0000 [IU] | INTRAMUSCULAR | Status: DC

## 2015-01-16 ENCOUNTER — Encounter (HOSPITAL_COMMUNITY)
Admission: RE | Admit: 2015-01-16 | Discharge: 2015-01-16 | Disposition: A | Discharge status: Home / Self Care | Payer: Commercial Managed Care - HMO | Source: Ambulatory Visit | Attending: Nephrology | Admitting: Nephrology

## 2015-01-16 DIAGNOSIS — D631 Anemia in chronic kidney disease: Secondary | ICD-10-CM | POA: Diagnosis not present

## 2015-01-16 LAB — IRON AND TIBC
Iron: 91 ug/dL (ref 42–145)
Saturation Ratios: 33 % (ref 20–55)
TIBC: 275 ug/dL (ref 250–470)
UIBC: 184 ug/dL (ref 125–400)

## 2015-01-16 LAB — POCT HEMOGLOBIN-HEMACUE: HEMOGLOBIN: 11.6 g/dL — AB (ref 12.0–15.0)

## 2015-01-16 LAB — FERRITIN: Ferritin: 723 ng/mL — ABNORMAL HIGH (ref 10–291)

## 2015-01-16 MED ORDER — EPOETIN ALFA 10000 UNIT/ML IJ SOLN: 20000.0000 [IU] | INTRAMUSCULAR | Status: DC

## 2015-01-16 MED ORDER — EPOETIN ALFA 20000 UNIT/ML IJ SOLN
INTRAMUSCULAR | Status: AC
  Administered 2015-01-16: 20000 [IU] via SUBCUTANEOUS
  Filled 2015-01-16: qty 1

## 2015-01-21 ENCOUNTER — Other Ambulatory Visit: Payer: Self-pay | Admitting: Internal Medicine

## 2015-01-28 ENCOUNTER — Other Ambulatory Visit: Payer: Self-pay | Admitting: Cardiovascular Disease

## 2015-02-11 ENCOUNTER — Other Ambulatory Visit: Payer: Self-pay | Admitting: Internal Medicine

## 2015-02-11 ENCOUNTER — Other Ambulatory Visit: Payer: Self-pay | Admitting: Cardiovascular Disease

## 2015-02-11 ENCOUNTER — Other Ambulatory Visit: Payer: Self-pay | Admitting: Physician Assistant

## 2015-02-20 ENCOUNTER — Encounter (HOSPITAL_COMMUNITY)
Admission: RE | Admit: 2015-02-20 | Discharge: 2015-02-20 | Disposition: A | Discharge status: Home / Self Care | Payer: Commercial Managed Care - HMO | Source: Ambulatory Visit | Attending: Nephrology | Admitting: Nephrology

## 2015-02-20 DIAGNOSIS — D631 Anemia in chronic kidney disease: Secondary | ICD-10-CM | POA: Insufficient documentation

## 2015-02-20 DIAGNOSIS — Z79899 Other long term (current) drug therapy: Secondary | ICD-10-CM | POA: Insufficient documentation

## 2015-02-20 DIAGNOSIS — N184 Chronic kidney disease, stage 4 (severe): Secondary | ICD-10-CM | POA: Insufficient documentation

## 2015-02-20 DIAGNOSIS — Z5181 Encounter for therapeutic drug level monitoring: Secondary | ICD-10-CM | POA: Diagnosis not present

## 2015-02-20 MED ORDER — EPOETIN ALFA 10000 UNIT/ML IJ SOLN: 20000.0000 [IU] | INTRAMUSCULAR | Status: DC

## 2015-02-23 LAB — POCT HEMOGLOBIN-HEMACUE: Hemoglobin: 12.9 g/dL (ref 12.0–15.0)

## 2015-03-06 ENCOUNTER — Encounter (HOSPITAL_COMMUNITY)
Admission: RE | Admit: 2015-03-06 | Discharge: 2015-03-06 | Disposition: A | Discharge status: Home / Self Care | Payer: Commercial Managed Care - HMO | Source: Ambulatory Visit | Attending: Nephrology | Admitting: Nephrology

## 2015-03-06 DIAGNOSIS — Z79899 Other long term (current) drug therapy: Secondary | ICD-10-CM | POA: Diagnosis not present

## 2015-03-06 DIAGNOSIS — Z5181 Encounter for therapeutic drug level monitoring: Secondary | ICD-10-CM | POA: Diagnosis not present

## 2015-03-06 DIAGNOSIS — N184 Chronic kidney disease, stage 4 (severe): Secondary | ICD-10-CM | POA: Diagnosis present

## 2015-03-06 DIAGNOSIS — D631 Anemia in chronic kidney disease: Secondary | ICD-10-CM | POA: Diagnosis not present

## 2015-03-06 LAB — POCT HEMOGLOBIN-HEMACUE: Hemoglobin: 12.7 g/dL (ref 12.0–15.0)

## 2015-03-06 MED ORDER — EPOETIN ALFA 10000 UNIT/ML IJ SOLN: 20000.0000 [IU] | INTRAMUSCULAR | Status: DC

## 2015-03-11 ENCOUNTER — Encounter (HOSPITAL_COMMUNITY): Payer: Medicare PPO

## 2015-03-12 ENCOUNTER — Ambulatory Visit: Payer: Medicare PPO | Admitting: Internal Medicine

## 2015-03-18 ENCOUNTER — Other Ambulatory Visit (INDEPENDENT_AMBULATORY_CARE_PROVIDER_SITE_OTHER): Payer: Commercial Managed Care - HMO

## 2015-03-18 ENCOUNTER — Ambulatory Visit (INDEPENDENT_AMBULATORY_CARE_PROVIDER_SITE_OTHER): Payer: Commercial Managed Care - HMO | Admitting: Internal Medicine

## 2015-03-18 ENCOUNTER — Other Ambulatory Visit: Payer: Self-pay | Admitting: Internal Medicine

## 2015-03-18 ENCOUNTER — Encounter: Payer: Self-pay | Admitting: Internal Medicine

## 2015-03-18 VITALS — BP 152/94 | HR 66 | Temp 97.5°F | Wt 197.0 lb

## 2015-03-18 DIAGNOSIS — Z0000 Encounter for general adult medical examination without abnormal findings: Secondary | ICD-10-CM

## 2015-03-18 DIAGNOSIS — R7989 Other specified abnormal findings of blood chemistry: Secondary | ICD-10-CM

## 2015-03-18 DIAGNOSIS — Z0189 Encounter for other specified special examinations: Secondary | ICD-10-CM

## 2015-03-18 DIAGNOSIS — E119 Type 2 diabetes mellitus without complications: Secondary | ICD-10-CM | POA: Diagnosis not present

## 2015-03-18 LAB — BASIC METABOLIC PANEL
BUN: 58 mg/dL — AB (ref 6–23)
CO2: 30 meq/L (ref 19–32)
Calcium: 10.1 mg/dL (ref 8.4–10.5)
Chloride: 98 mEq/L (ref 96–112)
Creatinine, Ser: 2.7 mg/dL — ABNORMAL HIGH (ref 0.40–1.20)
GFR: 22.35 mL/min — ABNORMAL LOW (ref >60.00)
Glucose, Bld: 116 mg/dL — ABNORMAL HIGH (ref 70–99)
Potassium: 3.2 mEq/L — ABNORMAL LOW (ref 3.5–5.1)
Sodium: 138 mEq/L (ref 135–145)

## 2015-03-18 LAB — LIPID PANEL
CHOL/HDL RATIO: 6
CHOLESTEROL: 208 mg/dL — AB (ref 0–200)
HDL: 32.4 mg/dL — ABNORMAL LOW (ref >39.00)
NonHDL: 175.6
Triglycerides: 207 mg/dL — ABNORMAL HIGH (ref 0.0–149.0)
VLDL: 41.4 mg/dL — AB (ref 0.0–40.0)

## 2015-03-18 LAB — CBC WITH DIFFERENTIAL/PLATELET
Basophils Absolute: 0 10*3/uL (ref 0.0–0.1)
Basophils Relative: 0.5 % (ref 0.0–3.0)
EOS PCT: 3.1 % (ref 0.0–5.0)
Eosinophils Absolute: 0.2 10*3/uL (ref 0.0–0.7)
HCT: 34.3 % — ABNORMAL LOW (ref 36.0–46.0)
Hemoglobin: 11.6 g/dL — ABNORMAL LOW (ref 12.0–15.0)
LYMPHS PCT: 20.4 % (ref 12.0–46.0)
Lymphs Abs: 1.5 10*3/uL (ref 0.7–4.0)
MCHC: 33.7 g/dL (ref 30.0–36.0)
MCV: 82.9 fl (ref 78.0–100.0)
MONO ABS: 0.5 10*3/uL (ref 0.1–1.0)
MONOS PCT: 7.4 % (ref 3.0–12.0)
Neutro Abs: 5 10*3/uL (ref 1.4–7.7)
Neutrophils Relative %: 68.6 % (ref 43.0–77.0)
PLATELETS: 333 10*3/uL (ref 150.0–400.0)
RBC: 4.14 Mil/uL (ref 3.87–5.11)
RDW: 15.2 % (ref 11.5–15.5)
WBC: 7.4 10*3/uL (ref 4.0–10.5)

## 2015-03-18 LAB — HEMOGLOBIN A1C: Hgb A1c MFr Bld: 6.1 % (ref 4.6–6.5)

## 2015-03-18 LAB — URINALYSIS, ROUTINE W REFLEX MICROSCOPIC
Bilirubin Urine: NEGATIVE
Hgb urine dipstick: NEGATIVE
KETONES UR: NEGATIVE
Nitrite: POSITIVE — AB
PH: 5.5 (ref 5.0–8.0)
Specific Gravity, Urine: 1.01 (ref 1.000–1.030)
Total Protein, Urine: NEGATIVE
URINE GLUCOSE: NEGATIVE
UROBILINOGEN UA: 0.2 (ref 0.0–1.0)

## 2015-03-18 LAB — MICROALBUMIN / CREATININE URINE RATIO
Creatinine,U: 99 mg/dL
Microalb Creat Ratio: 1.5 mg/g (ref 0.0–30.0)
Microalb, Ur: 1.5 mg/dL (ref 0.0–1.9)

## 2015-03-18 LAB — HEPATIC FUNCTION PANEL
ALBUMIN: 3.8 g/dL (ref 3.5–5.2)
ALK PHOS: 89 U/L (ref 39–117)
ALT: 14 U/L (ref 0–35)
AST: 14 U/L (ref 0–37)
BILIRUBIN DIRECT: 0.1 mg/dL (ref 0.0–0.3)
BILIRUBIN TOTAL: 0.4 mg/dL (ref 0.2–1.2)
Total Protein: 7.4 g/dL (ref 6.0–8.3)

## 2015-03-18 LAB — LDL CHOLESTEROL, DIRECT: Direct LDL: 116 mg/dL

## 2015-03-18 LAB — TSH: TSH: 2.42 u[IU]/mL (ref 0.35–4.50)

## 2015-03-18 MED ORDER — CEPHALEXIN 500 MG PO CAPS: 500.0000 mg | ORAL_CAPSULE | Freq: Four times a day (QID) | ORAL | Status: DC

## 2015-03-18 NOTE — Progress Notes (Signed)
Subjective:    Patient ID: [name], female    DOB: [birth year], 69 y.o.   MRN: [id number]  HPI  Here for wellness and f/u;  Overall doing ok;  Pt denies Chest pain, worsening SOB, DOE, wheezing, orthopnea, PND, worsening LE edema, palpitations, dizziness or syncope.  Pt denies neurological change such as new headache, facial or extremity weakness.  Pt denies polydipsia, polyuria, or low sugar symptoms. Pt states overall good compliance with treatment and medications, good tolerability, and has been trying to follow appropriate diet.  Pt denies worsening depressive symptoms, suicidal ideation or panic. No fever, night sweats, wt loss, loss of appetite, or other constitutional symptoms.  Pt states good ability with ADL's, has low fall risk, home safety reviewed and adequate, no other significant changes in hearing or vision, and only occasionally active with exercise.  No current complaints. Renal fxn stable per renal per pt Past Medical History  Diagnosis Date  . Glaucoma   . Mitral regurgitation   . DM2 (diabetes mellitus, type 2)   . GERD (gastroesophageal reflux disease)   . HLD (hyperlipidemia)   . HTN (hypertension)   . PUD (peptic ulcer disease)   . Anemia   . Carotid stenosis   . Osteoporosis   . PVD (peripheral vascular disease)   . Anemia, iron deficiency   . CAD (coronary artery disease)   . Shortness of breath     with exertion  . Chronic kidney disease     chronic renal failure  . Arthritis   . Carotid stenosis     Carotid US (02/2014):  Bilateral ICA 40-59%; > 50% L ECA; F/u 1 year  . Hx of cardiovascular stress test     Lexiscan Myoview (03/24/14):  No ischemia, EF 73%, Normal   Past Surgical History  Procedure Laterality Date  . Appendectomy    . Hysterectomy - unknown type    . Oophorectomy    . Abdominal hysterectomy    . Av fistula placement Right 01/18/2013    Procedure: ARTERIOVENOUS (AV) FISTULA CREATION;  Surgeon: Rosetta Posner, MD;  Location: [facility];   Service: Vascular;  Laterality: Right;  Ultrasound guided  . Renal angiogram N/A 09/16/2011    Procedure: RENAL ANGIOGRAM;  Surgeon: Sherren Mocha, MD;  Location: [facility] CATH LAB;  Service: Cardiovascular;  Laterality: N/A;    reports that she quit smoking about 7 years ago. She has never used smokeless tobacco. She reports that she does not drink alcohol or use illicit drugs. family history includes Diabetes in her sister and son; Glaucoma in her father; Heart disease in her father and another family member; Muscular dystrophy in an other family member; Stroke in an other family member. No Known Allergies Current Outpatient Prescriptions on File Prior to Visit  Medication Sig Dispense Refill  . Acetaminophen-Codeine 300-30 MG per tablet Take 1 tablet by mouth every 4 (four) hours as needed for pain. 30 tablet 0  . amLODipine (NORVASC) 10 MG tablet TAKE ONE (1) TABLET EACH DAY 90 tablet 2  . aspirin 81 MG tablet Take 81 mg by mouth daily.    Marland Kitchen atorvastatin (LIPITOR) 80 MG tablet TAKE 1 TABLET (80 MG TOTAL) BY MOUTH DAILY. 90 tablet 1  . BD PEN NEEDLE NANO U/F 32G X 4 MM MISC USE DAILY WITH LANTUS SOLASTAR 100 each 11  . BD PEN NEEDLE NANO U/F 32G X 4 MM MISC USE DAILY WITH LANTUS SOLASTAR 100 each 1  . calcitRIOL (ROCALTROL) 0.25  MCG capsule Take 0.25 mcg by mouth daily.    . clopidogrel (PLAVIX) 75 MG tablet TAKE ONE (1) TABLET EACH DAY 90 tablet 2  . colchicine (COLCRYS) 0.6 MG tablet Take 1 tablet (0.6 mg total) by mouth daily. 30 tablet 2  . ferrous sulfate 325 (65 FE) MG tablet TAKE 1 TABLET (325 MG TOTAL) BY MOUTH 2 (TWO) TIMES DAILY. 60 tablet 11  . furosemide (LASIX) 80 MG tablet Take 1 tablet (80 mg total) by mouth 2 (two) times daily. 180 tablet 3  . isosorbide mononitrate (IMDUR) 30 MG 24 hr tablet TAKE 1 TABLET (30 MG TOTAL) BY MOUTH DAILY. 30 tablet 6  . JANUVIA 100 MG tablet TAKE 1 TABLET BY MOUTH EVERY DAY 90 tablet 3  . LANTUS SOLOSTAR 100 UNIT/ML Solostar Pen INJECT 10 UNITS INTO  THE SKIN DAILY 30 mL 4  . metoprolol (LOPRESSOR) 100 MG tablet TAKE 1 TABLET (100 MG TOTAL) BY MOUTH 2 (TWO) TIMES DAILY. 180 tablet 2  . nitroGLYCERIN (NITROSTAT) 0.4 MG SL tablet Place 1 tablet (0.4 mg total) under the tongue every 5 (five) minutes as needed. 25 tablet 3  . omeprazole (PRILOSEC) 20 MG capsule TAKE 1 CAPSULE (20 MG TOTAL) BY MOUTH DAILY. 90 capsule 1  . oxyCODONE (ROXICODONE) 5 MG immediate release tablet Take 1 tablet (5 mg total) by mouth every 6 (six) hours as needed for pain. 30 tablet 0  . pioglitazone (ACTOS) 45 MG tablet TAKE 1 TABLET BY MOUTH EVERY DAY 90 tablet 2  . risedronate (ACTONEL) 150 MG tablet TAKE 1 TABLET BY MOUTH ONCE A MONTH-TAKE ON THE SAME DAY OF EACH MONTH. 1 tablet 5  . timolol (BETIMOL) 0.5 % ophthalmic solution Place 1 drop into both eyes daily.     . vitamin B-12 (CYANOCOBALAMIN) 1000 MCG tablet Take 1,000 mcg by mouth daily.    Marland Kitchen ZETIA 10 MG tablet TAKE 1 TABLET (10 MG TOTAL) BY MOUTH DAILY. 90 tablet 1   No current facility-administered medications on file prior to visit.   Review of Systems Constitutional: Negative for increased diaphoresis, other activity, appetite or siginficant weight change other than noted HENT: Negative for worsening hearing loss, ear pain, facial swelling, mouth sores and neck stiffness.   Eyes: Negative for other worsening pain, redness or visual disturbance.  Respiratory: Negative for shortness of breath and wheezing  Cardiovascular: Negative for chest pain and palpitations.  Gastrointestinal: Negative for diarrhea, blood in stool, abdominal distention or other pain Genitourinary: Negative for hematuria, flank pain or change in urine volume.  Musculoskeletal: Negative for myalgias or other joint complaints.  Skin: Negative for color change and wound or drainage.  Neurological: Negative for syncope and numbness. other than noted Hematological: Negative for adenopathy. or other swelling Psychiatric/Behavioral: Negative  for hallucinations, SI, self-injury, decreased concentration or other worsening agitation.      Objective:   Physical Exam BP 152/94 mmHg  Pulse 66  Temp(Src) 97.5 F (36.4 C) (Oral)  Wt 197 lb 0.6 oz (89.377 kg)  SpO2 97% VS noted, walks with cane Constitutional: Pt is oriented to person, place, and time. Appears well-developed and well-nourished, in no significant distress Head: Normocephalic and atraumatic.  Right Ear: External ear normal.  Left Ear: External ear normal.  Nose: Nose normal.  Mouth/Throat: Oropharynx is clear and moist.  Eyes: Conjunctivae and EOM are normal. Pupils are equal, round, and reactive to light.  Neck: Normal range of motion. Neck supple. No JVD present. No tracheal deviation present or significant  neck LA or mass Cardiovascular: Normal rate, regular rhythm, normal heart sounds and intact distal pulses.   Pulmonary/Chest: Effort normal and breath sounds without rales or wheezing  Abdominal: Soft. Bowel sounds are normal. NT. No HSM  Musculoskeletal: Normal range of motion. Exhibits no edema.  Lymphadenopathy:  Has no cervical adenopathy.  Neurological: Pt is alert and oriented to person, place, and time. Pt has normal reflexes. No cranial nerve deficit. Motor grossly intact Skin: Skin is warm and dry. No rash noted.  Psychiatric:  Has normal mood and affect. Behavior is normal.     Assessment & Plan:

## 2015-03-18 NOTE — Progress Notes (Signed)
Pre visit review using our clinic review tool, if applicable. No additional management support is needed unless otherwise documented below in the visit note. 

## 2015-03-18 NOTE — Patient Instructions (Signed)

## 2015-03-18 NOTE — Assessment & Plan Note (Signed)

## 2015-03-19 ENCOUNTER — Encounter (HOSPITAL_COMMUNITY): Payer: Medicare PPO

## 2015-03-24 ENCOUNTER — Other Ambulatory Visit (HOSPITAL_COMMUNITY): Payer: Self-pay | Admitting: Cardiovascular Disease

## 2015-03-24 DIAGNOSIS — I6523 Occlusion and stenosis of bilateral carotid arteries: Secondary | ICD-10-CM

## 2015-03-25 ENCOUNTER — Encounter (HOSPITAL_COMMUNITY)
Admission: RE | Admit: 2015-03-25 | Discharge: 2015-03-25 | Disposition: A | Discharge status: Home / Self Care | Payer: Commercial Managed Care - HMO | Source: Ambulatory Visit | Attending: Nephrology | Admitting: Nephrology

## 2015-03-25 ENCOUNTER — Other Ambulatory Visit: Payer: Self-pay | Admitting: Internal Medicine

## 2015-03-25 DIAGNOSIS — D631 Anemia in chronic kidney disease: Secondary | ICD-10-CM | POA: Insufficient documentation

## 2015-03-25 DIAGNOSIS — N184 Chronic kidney disease, stage 4 (severe): Secondary | ICD-10-CM | POA: Insufficient documentation

## 2015-03-25 LAB — IRON AND TIBC
IRON: 74 ug/dL (ref 28–170)
SATURATION RATIOS: 24 % (ref 10.4–31.8)
TIBC: 308 ug/dL (ref 250–450)
UIBC: 234 ug/dL

## 2015-03-25 LAB — POCT HEMOGLOBIN-HEMACUE: Hemoglobin: 11 g/dL — ABNORMAL LOW (ref 12.0–15.0)

## 2015-03-25 LAB — FERRITIN: Ferritin: 535 ng/mL — ABNORMAL HIGH (ref 11–307)

## 2015-03-25 MED ORDER — EPOETIN ALFA 10000 UNIT/ML IJ SOLN: 20000.0000 [IU] | INTRAMUSCULAR | Status: DC

## 2015-03-25 MED ORDER — EPOETIN ALFA 20000 UNIT/ML IJ SOLN
INTRAMUSCULAR | Status: AC
  Administered 2015-03-25: 20000 [IU]
  Filled 2015-03-25: qty 1

## 2015-03-26 ENCOUNTER — Telehealth: Payer: Self-pay | Admitting: Internal Medicine

## 2015-03-26 NOTE — Telephone Encounter (Signed)
Patient is requesting a humana referral to [facility] opthalmology.  Patient has an appointment on 6/8 for a diabetic eye exam.

## 2015-03-26 NOTE — Telephone Encounter (Signed)
Marda Stalker from the [facility] dept of [facility] called  and patient was there on 4/29 for some lab work. The coders are needing a detailed handwritten diagnosis of why this lab work was needed in order for them to be able to complete the chart. He will need to sign and date this with the date of the lab work. Can you please fax this to the attention of Marda Stalker fax [phone].

## 2015-03-26 NOTE — Telephone Encounter (Signed)
Please note the encounter below

## 2015-03-27 ENCOUNTER — Ambulatory Visit (HOSPITAL_COMMUNITY): Payer: Commercial Managed Care - HMO | Attending: Cardiovascular Disease

## 2015-03-27 DIAGNOSIS — I6523 Occlusion and stenosis of bilateral carotid arteries: Secondary | ICD-10-CM | POA: Insufficient documentation

## 2015-03-27 NOTE — Telephone Encounter (Signed)
Very sorry, I cannot provide this documentation, since I was not the ordering provider

## 2015-03-31 NOTE — Telephone Encounter (Signed)
Mcarthur Rossetti Josem Kaufmann [id number] valid 03/31/15-09/27/15 for 6 visits

## 2015-04-23 ENCOUNTER — Other Ambulatory Visit: Payer: Self-pay | Admitting: Internal Medicine

## 2015-04-29 ENCOUNTER — Encounter (HOSPITAL_COMMUNITY)
Admission: RE | Admit: 2015-04-29 | Discharge: 2015-04-29 | Disposition: A | Discharge status: Home / Self Care | Payer: Commercial Managed Care - HMO | Source: Ambulatory Visit | Attending: Nephrology | Admitting: Nephrology

## 2015-04-29 DIAGNOSIS — D631 Anemia in chronic kidney disease: Secondary | ICD-10-CM | POA: Diagnosis present

## 2015-04-29 DIAGNOSIS — N184 Chronic kidney disease, stage 4 (severe): Secondary | ICD-10-CM | POA: Insufficient documentation

## 2015-04-29 LAB — POCT HEMOGLOBIN-HEMACUE: Hemoglobin: 11 g/dL — ABNORMAL LOW (ref 12.0–15.0)

## 2015-04-29 MED ORDER — EPOETIN ALFA 10000 UNIT/ML IJ SOLN: 20000.0000 [IU] | INTRAMUSCULAR | Status: DC

## 2015-04-29 MED ORDER — EPOETIN ALFA 20000 UNIT/ML IJ SOLN
INTRAMUSCULAR | Status: AC
  Administered 2015-04-29: 20000 [IU] via SUBCUTANEOUS
  Filled 2015-04-29: qty 1

## 2015-05-01 ENCOUNTER — Other Ambulatory Visit: Payer: Self-pay | Admitting: Internal Medicine

## 2015-05-13 ENCOUNTER — Other Ambulatory Visit: Payer: Self-pay | Admitting: Internal Medicine

## 2015-05-18 ENCOUNTER — Other Ambulatory Visit: Payer: Self-pay | Admitting: Internal Medicine

## 2015-05-19 ENCOUNTER — Other Ambulatory Visit: Payer: Self-pay

## 2015-05-19 MED ORDER — FERROUS SULFATE 325 (65 FE) MG PO TABS: ORAL_TABLET | ORAL | Status: DC

## 2015-05-19 MED ORDER — EZETIMIBE 10 MG PO TABS: ORAL_TABLET | ORAL | Status: DC

## 2015-06-03 ENCOUNTER — Encounter (HOSPITAL_COMMUNITY): Admission: RE | Admit: 2015-06-03 | Payer: Commercial Managed Care - HMO | Source: Ambulatory Visit

## 2015-06-10 ENCOUNTER — Encounter (HOSPITAL_COMMUNITY)
Admission: RE | Admit: 2015-06-10 | Discharge: 2015-06-10 | Disposition: A | Discharge status: Home / Self Care | Payer: Commercial Managed Care - HMO | Source: Ambulatory Visit | Attending: Nephrology | Admitting: Nephrology

## 2015-06-10 DIAGNOSIS — D631 Anemia in chronic kidney disease: Secondary | ICD-10-CM | POA: Diagnosis not present

## 2015-06-10 DIAGNOSIS — N184 Chronic kidney disease, stage 4 (severe): Secondary | ICD-10-CM | POA: Insufficient documentation

## 2015-06-10 LAB — IRON AND TIBC
Iron: 98 ug/dL (ref 28–170)
Saturation Ratios: 29 % (ref 10.4–31.8)
TIBC: 333 ug/dL (ref 250–450)
UIBC: 235 ug/dL

## 2015-06-10 LAB — POCT HEMOGLOBIN-HEMACUE: Hemoglobin: 12.4 g/dL (ref 12.0–15.0)

## 2015-06-10 LAB — FERRITIN: FERRITIN: 567 ng/mL — AB (ref 11–307)

## 2015-06-10 MED ORDER — EPOETIN ALFA 10000 UNIT/ML IJ SOLN: 20000.0000 [IU] | INTRAMUSCULAR | Status: DC

## 2015-06-24 ENCOUNTER — Inpatient Hospital Stay (HOSPITAL_COMMUNITY): Admission: RE | Admit: 2015-06-24 | Payer: Commercial Managed Care - HMO | Source: Ambulatory Visit

## 2015-07-01 ENCOUNTER — Other Ambulatory Visit (HOSPITAL_COMMUNITY): Payer: Self-pay | Admitting: *Deleted

## 2015-07-02 ENCOUNTER — Inpatient Hospital Stay (HOSPITAL_COMMUNITY): Admission: RE | Admit: 2015-07-02 | Payer: Commercial Managed Care - HMO | Source: Ambulatory Visit

## 2015-07-10 ENCOUNTER — Encounter (HOSPITAL_COMMUNITY)
Admission: RE | Admit: 2015-07-10 | Discharge: 2015-07-10 | Disposition: A | Discharge status: Home / Self Care | Payer: Commercial Managed Care - HMO | Source: Ambulatory Visit | Attending: Nephrology | Admitting: Nephrology

## 2015-07-10 DIAGNOSIS — N184 Chronic kidney disease, stage 4 (severe): Secondary | ICD-10-CM | POA: Diagnosis not present

## 2015-07-10 DIAGNOSIS — D631 Anemia in chronic kidney disease: Secondary | ICD-10-CM | POA: Diagnosis present

## 2015-07-10 LAB — POCT HEMOGLOBIN-HEMACUE: HEMOGLOBIN: 11.4 g/dL — AB (ref 12.0–15.0)

## 2015-07-10 MED ORDER — EPOETIN ALFA 10000 UNIT/ML IJ SOLN
20000.0000 [IU] | INTRAMUSCULAR | Status: DC
  Administered 2015-07-10: 20000 [IU] via SUBCUTANEOUS

## 2015-07-10 MED ORDER — EPOETIN ALFA 20000 UNIT/ML IJ SOLN
INTRAMUSCULAR | Status: DC
Start: 2015-07-10 — End: 2015-07-11
  Filled 2015-07-10: qty 1

## 2015-08-14 ENCOUNTER — Encounter (HOSPITAL_COMMUNITY): Payer: Commercial Managed Care - HMO

## 2015-08-28 ENCOUNTER — Encounter (HOSPITAL_COMMUNITY)
Admission: RE | Admit: 2015-08-28 | Discharge: 2015-08-28 | Disposition: A | Discharge status: Home / Self Care | Payer: Commercial Managed Care - HMO | Source: Ambulatory Visit | Attending: Nephrology | Admitting: Nephrology

## 2015-08-28 DIAGNOSIS — N184 Chronic kidney disease, stage 4 (severe): Secondary | ICD-10-CM | POA: Diagnosis not present

## 2015-08-28 DIAGNOSIS — D631 Anemia in chronic kidney disease: Secondary | ICD-10-CM | POA: Diagnosis present

## 2015-08-28 LAB — IRON AND TIBC
IRON: 97 ug/dL (ref 28–170)
Saturation Ratios: 29 % (ref 10.4–31.8)
TIBC: 333 ug/dL (ref 250–450)
UIBC: 236 ug/dL

## 2015-08-28 LAB — FERRITIN: FERRITIN: 499 ng/mL — AB (ref 11–307)

## 2015-08-28 LAB — POCT HEMOGLOBIN-HEMACUE: Hemoglobin: 11.5 g/dL — ABNORMAL LOW (ref 12.0–15.0)

## 2015-08-28 MED ORDER — EPOETIN ALFA 20000 UNIT/ML IJ SOLN
INTRAMUSCULAR | Status: AC
  Administered 2015-08-28: 20000 [IU] via SUBCUTANEOUS
  Filled 2015-08-28: qty 1

## 2015-08-28 MED ORDER — EPOETIN ALFA 10000 UNIT/ML IJ SOLN: 20000.0000 [IU] | INTRAMUSCULAR | Status: DC

## 2015-09-07 ENCOUNTER — Other Ambulatory Visit: Payer: Self-pay

## 2015-09-07 ENCOUNTER — Other Ambulatory Visit: Payer: Self-pay | Admitting: Internal Medicine

## 2015-09-09 ENCOUNTER — Other Ambulatory Visit: Payer: Self-pay | Admitting: Cardiovascular Disease

## 2015-09-18 ENCOUNTER — Ambulatory Visit: Payer: Commercial Managed Care - HMO | Admitting: Internal Medicine

## 2015-09-19 ENCOUNTER — Other Ambulatory Visit: Payer: Self-pay | Admitting: Internal Medicine

## 2015-09-24 ENCOUNTER — Ambulatory Visit: Payer: Commercial Managed Care - HMO | Admitting: Internal Medicine

## 2015-09-25 ENCOUNTER — Ambulatory Visit (INDEPENDENT_AMBULATORY_CARE_PROVIDER_SITE_OTHER): Payer: Commercial Managed Care - HMO | Admitting: Internal Medicine

## 2015-09-25 ENCOUNTER — Encounter: Payer: Self-pay | Admitting: Internal Medicine

## 2015-09-25 VITALS — BP 116/80 | HR 70 | Temp 97.5°F | Ht 60.0 in | Wt 210.0 lb

## 2015-09-25 DIAGNOSIS — I10 Essential (primary) hypertension: Secondary | ICD-10-CM

## 2015-09-25 DIAGNOSIS — E119 Type 2 diabetes mellitus without complications: Secondary | ICD-10-CM

## 2015-09-25 DIAGNOSIS — E785 Hyperlipidemia, unspecified: Secondary | ICD-10-CM | POA: Diagnosis not present

## 2015-09-25 NOTE — Patient Instructions (Addendum)
You had the flu shot today  Please continue all other medications as before, and refills have been done if requested.  Please have the pharmacy call with any other refills you may need.  Please continue your efforts at being more active, low cholesterol diet, and weight control.  You are otherwise up to date with prevention measures today.  Please keep your appointments with your specialists as you may have planned  We will look forward to the blood work results per Dr Mercy Moore .  Please return in 6 months, or sooner if needed  Your handicap parking form was filled out for you

## 2015-09-25 NOTE — Assessment & Plan Note (Signed)
stable overall by history and exam, recent data reviewed with pt, and pt to continue medical treatment as before,  to f/u any worsening symptoms or concerns BP Readings from Last 3 Encounters:  09/25/15 116/80  08/28/15 140/55  07/10/15 141/90

## 2015-09-25 NOTE — Assessment & Plan Note (Signed)
stable overall by history and exam, recent data reviewed with pt, and pt to continue medical treatment as before,  to f/u any worsening symptoms or concerns Lab Results  Component Value Date   LDLCALC 61 03/13/2014    

## 2015-09-25 NOTE — Assessment & Plan Note (Signed)
stable overall by history and exam, recent data reviewed with pt, and pt to continue medical treatment as before,  to f/u any worsening symptoms or concerns Lab Results  Component Value Date   HGBA1C 6.1 03/18/2015   Had labs earlier today with renal, will look for results

## 2015-09-25 NOTE — Progress Notes (Signed)
Pre visit review using our clinic review tool, if applicable. No additional management support is needed unless otherwise documented below in the visit note. 

## 2015-09-25 NOTE — Progress Notes (Signed)
Subjective:    Patient ID: [name], female    DOB: 02-03-1946, 69 y.o.   MRN: [id number]  HPI   Here to f/u; overall doing ok,  Pt denies chest pain, increasing sob or doe, wheezing, orthopnea, PND, increased LE swelling, palpitations, dizziness or syncope.  Pt denies new neurological symptoms such as new headache, or facial or extremity weakness or numbness.  Pt denies polydipsia, polyuria, or low sugar episode.   Pt denies new neurological symptoms such as new headache, or facial or extremity weakness or numbness.   Pt states overall good compliance with meds, mostly trying to follow appropriate diet, with wt overall stable,  but little exercise however. Pt states Renal MD/Dr Mercy Moore will do labs and forward to me for DM, was just seen this am.   Pt denies fever, wt loss, night sweats, loss of appetite, or other constitutional symptoms  Denies urinary symptoms such as dysuria, frequency, urgency, flank pain, hematuria or n/v, fever, chills. Past Medical History  Diagnosis Date  . Glaucoma   . Mitral regurgitation   . DM2 (diabetes mellitus, type 2) ([place])   . GERD (gastroesophageal reflux disease)   . HLD (hyperlipidemia)   . HTN (hypertension)   . PUD (peptic ulcer disease)   . Anemia   . Carotid stenosis   . Osteoporosis   . PVD (peripheral vascular disease) ([place])   . Anemia, iron deficiency   . CAD (coronary artery disease)   . Shortness of breath     with exertion  . Chronic kidney disease     chronic renal failure  . Arthritis   . Carotid stenosis     Carotid US (02/2014):  Bilateral ICA 40-59%; > 50% L ECA; F/u 1 year  . Hx of cardiovascular stress test     Lexiscan Myoview (03/24/14):  No ischemia, EF 73%, Normal   Past Surgical History  Procedure Laterality Date  . Appendectomy    . Hysterectomy - unknown type    . Oophorectomy    . Abdominal hysterectomy    . Av fistula placement Right 01/18/2013    Procedure: ARTERIOVENOUS (AV) FISTULA CREATION;  Surgeon:  Rosetta Posner, MD;  Location: [facility];  Service: Vascular;  Laterality: Right;  Ultrasound guided  . Renal angiogram N/A 09/16/2011    Procedure: RENAL ANGIOGRAM;  Surgeon: Sherren Mocha, MD;  Location: [facility] CATH LAB;  Service: Cardiovascular;  Laterality: N/A;    reports that she quit smoking about 7 years ago. She has never used smokeless tobacco. She reports that she does not drink alcohol or use illicit drugs. family history includes Diabetes in her sister and son; Glaucoma in her father; Heart disease in her father and another family member; Muscular dystrophy in an other family member; Stroke in an other family member. No Known Allergies Current Outpatient Prescriptions on File Prior to Visit  Medication Sig Dispense Refill  . Acetaminophen-Codeine 300-30 MG per tablet Take 1 tablet by mouth every 4 (four) hours as needed for pain. 30 tablet 0  . amLODipine (NORVASC) 10 MG tablet TAKE ONE (1) TABLET EACH DAY 90 tablet 2  . aspirin 81 MG tablet Take 81 mg by mouth daily.    Marland Kitchen atorvastatin (LIPITOR) 80 MG tablet TAKE 1 TABLET (80 MG TOTAL) BY MOUTH DAILY. 90 tablet 0  . BD PEN NEEDLE NANO U/F 32G X 4 MM MISC USE DAILY WITH LANTUS SOLASTAR 100 each 11  . BD PEN NEEDLE NANO U/F 32G X 4  MM MISC USE DAILY WITH LANTUS SOLASTAR 100 each 1  . calcitRIOL (ROCALTROL) 0.25 MCG capsule Take 0.25 mcg by mouth daily.    . clopidogrel (PLAVIX) 75 MG tablet TAKE ONE (1) TABLET EACH DAY 90 tablet 2  . colchicine (COLCRYS) 0.6 MG tablet Take 1 tablet (0.6 mg total) by mouth daily. 30 tablet 2  . ezetimibe (ZETIA) 10 MG tablet TAKE 1 TABLET (10 MG TOTAL) BY MOUTH DAILY. 90 tablet 1  . ferrous sulfate 325 (65 FE) MG tablet TAKE 1 TABLET (325 MG TOTAL) BY MOUTH 2 (TWO) TIMES DAILY. 60 tablet 5  . furosemide (LASIX) 80 MG tablet Take 1 tablet (80 mg total) by mouth 2 (two) times daily. 180 tablet 3  . isosorbide mononitrate (IMDUR) 30 MG 24 hr tablet TAKE 1 TABLET (30 MG TOTAL) BY MOUTH DAILY. 30 tablet 6  .  JANUVIA 100 MG tablet TAKE 1 TABLET BY MOUTH EVERY DAY 90 tablet 3  . LANTUS SOLOSTAR 100 UNIT/ML Solostar Pen INJECT 10 UNITS INTO THE SKIN DAILY 3 pen 3  . metoprolol (LOPRESSOR) 100 MG tablet TAKE 1 TABLET (100 MG TOTAL) BY MOUTH 2 (TWO) TIMES DAILY. 180 tablet 2  . nitroGLYCERIN (NITROSTAT) 0.4 MG SL tablet Place 1 tablet (0.4 mg total) under the tongue every 5 (five) minutes as needed. 25 tablet 3  . omeprazole (PRILOSEC) 20 MG capsule TAKE 1 CAPSULE (20 MG TOTAL) BY MOUTH DAILY. 90 capsule 1  . oxyCODONE (ROXICODONE) 5 MG immediate release tablet Take 1 tablet (5 mg total) by mouth every 6 (six) hours as needed for pain. 30 tablet 0  . pioglitazone (ACTOS) 45 MG tablet TAKE 1 TABLET BY MOUTH EVERY DAY 90 tablet 1  . risedronate (ACTONEL) 150 MG tablet TAKE 1 TABLET BY MOUTH ONCE A MONTH-TAKE ON THE SAME DAY OF EACH MONTH. 1 tablet 5  . timolol (BETIMOL) 0.5 % ophthalmic solution Place 1 drop into both eyes daily.     . vitamin B-12 (CYANOCOBALAMIN) 1000 MCG tablet Take 1,000 mcg by mouth daily.    . cephALEXin (KEFLEX) 500 MG capsule Take 1 capsule (500 mg total) by mouth 4 (four) times daily. (Patient not taking: Reported on 09/25/2015) 40 capsule 0   No current facility-administered medications on file prior to visit.   Review of Systems  Constitutional: Negative for unusual diaphoresis or night sweats HENT: Negative for ringing in ear or discharge Eyes: Negative for double vision or worsening visual disturbance.  Respiratory: Negative for choking and stridor.   Gastrointestinal: Negative for vomiting or other signifcant bowel change Genitourinary: Negative for hematuria or change in urine volume.  Musculoskeletal: Negative for other MSK pain or swelling Skin: Negative for color change and worsening wound.  Neurological: Negative for tremors and numbness other than noted  Psychiatric/Behavioral: Negative for decreased concentration or agitation other than above       Objective:    Physical Exam BP 116/80 mmHg  Pulse 70  Temp(Src) 97.5 F (36.4 C) (Oral)  Ht 5' (1.524 m)  Wt 210 lb (95.255 kg)  BMI 41.01 kg/m2  SpO2 97% VS noted,  Constitutional: Pt appears in no significant distress HENT: Head: NCAT.  Right Ear: External ear normal.  Left Ear: External ear normal.  Eyes: . Pupils are equal, round, and reactive to light. Conjunctivae and EOM are normal Neck: Normal range of motion. Neck supple.  Cardiovascular: Normal rate and regular rhythm.   Pulmonary/Chest: Effort normal and breath sounds without rales or wheezing.  Abd:  Soft, NT, ND, + BS Neurological: Pt is alert. Not confused , motor grossly intact Skin: Skin is warm. No rash, no LE edema Psychiatric: Pt behavior is normal. No agitation.     Assessment & Plan:

## 2015-09-30 LAB — HM DIABETES EYE EXAM

## 2015-10-02 ENCOUNTER — Encounter (HOSPITAL_COMMUNITY)
Admission: RE | Admit: 2015-10-02 | Discharge: 2015-10-02 | Disposition: A | Discharge status: Home / Self Care | Payer: Commercial Managed Care - HMO | Source: Ambulatory Visit | Attending: Nephrology | Admitting: Nephrology

## 2015-10-02 DIAGNOSIS — N184 Chronic kidney disease, stage 4 (severe): Secondary | ICD-10-CM | POA: Insufficient documentation

## 2015-10-02 DIAGNOSIS — D631 Anemia in chronic kidney disease: Secondary | ICD-10-CM | POA: Diagnosis not present

## 2015-10-02 LAB — POCT HEMOGLOBIN-HEMACUE: HEMOGLOBIN: 11.2 g/dL — AB (ref 12.0–15.0)

## 2015-10-02 MED ORDER — EPOETIN ALFA 10000 UNIT/ML IJ SOLN: 20000.0000 [IU] | INTRAMUSCULAR | Status: DC

## 2015-10-02 MED ORDER — EPOETIN ALFA 20000 UNIT/ML IJ SOLN
INTRAMUSCULAR | Status: AC
  Administered 2015-10-02: 20000 [IU]
  Filled 2015-10-02: qty 1

## 2015-10-04 ENCOUNTER — Other Ambulatory Visit: Payer: Self-pay | Admitting: Cardiovascular Disease

## 2015-10-06 ENCOUNTER — Encounter: Payer: Self-pay | Admitting: Internal Medicine

## 2015-10-12 ENCOUNTER — Encounter (INDEPENDENT_AMBULATORY_CARE_PROVIDER_SITE_OTHER): Payer: Commercial Managed Care - HMO | Admitting: Ophthalmology

## 2015-10-12 DIAGNOSIS — H43813 Vitreous degeneration, bilateral: Secondary | ICD-10-CM | POA: Diagnosis not present

## 2015-10-12 DIAGNOSIS — I10 Essential (primary) hypertension: Secondary | ICD-10-CM

## 2015-10-12 DIAGNOSIS — H35033 Hypertensive retinopathy, bilateral: Secondary | ICD-10-CM | POA: Diagnosis not present

## 2015-10-12 DIAGNOSIS — H33302 Unspecified retinal break, left eye: Secondary | ICD-10-CM

## 2015-10-22 ENCOUNTER — Other Ambulatory Visit: Payer: Self-pay | Admitting: Internal Medicine

## 2015-10-25 ENCOUNTER — Other Ambulatory Visit: Payer: Self-pay | Admitting: Internal Medicine

## 2015-10-28 ENCOUNTER — Ambulatory Visit (INDEPENDENT_AMBULATORY_CARE_PROVIDER_SITE_OTHER): Payer: Commercial Managed Care - HMO | Admitting: Ophthalmology

## 2015-10-28 DIAGNOSIS — H33302 Unspecified retinal break, left eye: Secondary | ICD-10-CM

## 2015-11-01 ENCOUNTER — Other Ambulatory Visit: Payer: Self-pay | Admitting: Cardiovascular Disease

## 2015-11-01 ENCOUNTER — Other Ambulatory Visit: Payer: Self-pay | Admitting: Internal Medicine

## 2015-11-02 NOTE — Telephone Encounter (Signed)
Please advise as to how patient should currently be taking this medication. Her last office visit indicates that she is to take 60mg  qd, but the last several times it has been reordered for 30mg  qd. Per 04/07/14 office visit with Nicki Reaper Weaver:"I have suggested increasing her isosorbide 60 mg daily. We will continue to monitor for now. She knows to return for sooner follow up should her symptoms worsen." Thanks, MI

## 2015-11-03 NOTE — Telephone Encounter (Signed)
Spoke with patient and she stated that she takes one tablet (30mg ) daily. I made her aware that she is overdue for an appointment and she stated that she never received a letter or phone call to remind her of this, but she would be willing to schedule. I transferred her to the scheduling dept and she is aware that once she schedules, I will send her refill in.

## 2015-11-03 NOTE — Telephone Encounter (Signed)
Please contact the pt to verify how she is taking the medication.

## 2015-11-03 NOTE — Telephone Encounter (Signed)
Patient has still failed to schedule an appointment. I will send in a two week supply with a note for her to call and schedule for further refills.

## 2015-11-03 NOTE — Telephone Encounter (Signed)
The pt is on the wait list for an appointment with Dr Burt Knack.

## 2015-11-05 ENCOUNTER — Other Ambulatory Visit (HOSPITAL_COMMUNITY): Payer: Self-pay | Admitting: *Deleted

## 2015-11-06 ENCOUNTER — Encounter (HOSPITAL_COMMUNITY)
Admission: RE | Admit: 2015-11-06 | Discharge: 2015-11-06 | Disposition: A | Discharge status: Home / Self Care | Payer: Commercial Managed Care - HMO | Source: Ambulatory Visit | Attending: Nephrology | Admitting: Nephrology

## 2015-11-06 DIAGNOSIS — N184 Chronic kidney disease, stage 4 (severe): Secondary | ICD-10-CM | POA: Diagnosis not present

## 2015-11-06 DIAGNOSIS — D631 Anemia in chronic kidney disease: Secondary | ICD-10-CM | POA: Insufficient documentation

## 2015-11-06 LAB — FERRITIN: Ferritin: 434 ng/mL — ABNORMAL HIGH (ref 11–307)

## 2015-11-06 LAB — IRON AND TIBC
Iron: 80 ug/dL (ref 28–170)
SATURATION RATIOS: 26 % (ref 10.4–31.8)
TIBC: 311 ug/dL (ref 250–450)
UIBC: 231 ug/dL

## 2015-11-06 LAB — POCT HEMOGLOBIN-HEMACUE: HEMOGLOBIN: 11.3 g/dL — AB (ref 12.0–15.0)

## 2015-11-06 MED ORDER — EPOETIN ALFA 10000 UNIT/ML IJ SOLN: 20000.0000 [IU] | INTRAMUSCULAR | Status: DC

## 2015-11-06 MED ORDER — EPOETIN ALFA 20000 UNIT/ML IJ SOLN
INTRAMUSCULAR | Status: AC
  Administered 2015-11-06: 20000 [IU]
  Filled 2015-11-06: qty 1

## 2015-11-20 ENCOUNTER — Other Ambulatory Visit: Payer: Self-pay | Admitting: Internal Medicine

## 2015-12-06 ENCOUNTER — Other Ambulatory Visit: Payer: Self-pay | Admitting: Internal Medicine

## 2015-12-06 ENCOUNTER — Other Ambulatory Visit: Payer: Self-pay | Admitting: Cardiovascular Disease

## 2015-12-11 ENCOUNTER — Encounter (HOSPITAL_COMMUNITY)
Admission: RE | Admit: 2015-12-11 | Discharge: 2015-12-11 | Disposition: A | Discharge status: Home / Self Care | Payer: Commercial Managed Care - HMO | Source: Ambulatory Visit | Attending: Nephrology | Admitting: Nephrology

## 2015-12-11 DIAGNOSIS — N184 Chronic kidney disease, stage 4 (severe): Secondary | ICD-10-CM | POA: Diagnosis not present

## 2015-12-11 DIAGNOSIS — D631 Anemia in chronic kidney disease: Secondary | ICD-10-CM | POA: Insufficient documentation

## 2015-12-11 LAB — POCT HEMOGLOBIN-HEMACUE: HEMOGLOBIN: 12.7 g/dL (ref 12.0–15.0)

## 2015-12-11 MED ORDER — EPOETIN ALFA 10000 UNIT/ML IJ SOLN: 20000.0000 [IU] | INTRAMUSCULAR | Status: DC

## 2015-12-20 ENCOUNTER — Other Ambulatory Visit: Payer: Self-pay | Admitting: Cardiovascular Disease

## 2015-12-22 NOTE — Telephone Encounter (Signed)
Per phone note 11/03/15 Pt is on waiting list for Dr. Burt Knack (Last OV 07/08/14) How long should I extend this refill?  Barkley Boards, RN at 11/03/2015 4:24 PM     Status: Signed       Expand All Collapse All   The pt is on the wait list for an appointment with Dr Burt Knack.             Juventino Slovak, CMA at 11/03/2015 3:54 PM     Status: Signed       Expand All Collapse All   Patient has still failed to schedule an appointment. I will send in a two week supply with a note for her to call and schedule for further refills.            Juventino Slovak, CMA at 11/03/2015 1:37 PM     Status: Signed       Expand All Collapse All   Spoke with patient and she stated that she takes one tablet (30mg ) daily. I made her aware that she is overdue for an appointment and she stated that she never received a letter or phone call to remind her of this, but she would be willing to schedule. I transferred her to the scheduling dept and she is aware that once she schedules, I will send her refill in.            Barkley Boards, RN at 11/03/2015 9:07 AM     Status: Signed       Expand All Collapse All   Please contact the pt to verify how she is taking the medication.             Juventino Slovak, CMA at 11/02/2015 2:35 PM     Status: Signed       Expand All Collapse All   Please advise as to how patient should currently be taking this medication. Her last office visit indicates that she is to take 60mg  qd, but the last several times it has been reordered for 30mg  qd. Per 04/07/14 office visit with Nicki Reaper Weaver:"I have suggested increasing her isosorbide 60 mg daily. We will continue to monitor for now. She knows to return for sooner follow up should her symptoms worsen." Thanks, MI

## 2015-12-22 NOTE — Telephone Encounter (Signed)
I called the pt's home and a female answered the phone and said the pt is currently at a doctor's appointment.  She asked that we call back later today to arrange appointment.  I will go ahead and give an additional 2 week supply of medication at this time. Message sent to the pharmacy that the pt must make appointment for further refills.

## 2015-12-23 ENCOUNTER — Telehealth: Payer: Self-pay

## 2015-12-23 ENCOUNTER — Telehealth: Payer: Self-pay | Admitting: Internal Medicine

## 2015-12-23 NOTE — Telephone Encounter (Signed)
Pt called in and has question about a couple of her meds.  She would like a nurse to call her .

## 2015-12-23 NOTE — Telephone Encounter (Signed)
Patient went to pick up her zetia at the drug store and her insurance is no longer covering the medication. She is aware that Dr. [name] is out of the office until Monday, she is completely out of medication, is there anything we can do? Please advise

## 2015-12-23 NOTE — Telephone Encounter (Signed)
Please find out what medications are covered. She is already on a statin medication.

## 2015-12-25 ENCOUNTER — Encounter (HOSPITAL_COMMUNITY)
Admission: RE | Admit: 2015-12-25 | Discharge: 2015-12-25 | Disposition: A | Discharge status: Home / Self Care | Payer: Commercial Managed Care - HMO | Source: Ambulatory Visit | Attending: Nephrology | Admitting: Nephrology

## 2015-12-25 DIAGNOSIS — N184 Chronic kidney disease, stage 4 (severe): Secondary | ICD-10-CM | POA: Insufficient documentation

## 2015-12-25 DIAGNOSIS — D631 Anemia in chronic kidney disease: Secondary | ICD-10-CM | POA: Diagnosis present

## 2015-12-25 MED ORDER — EPOETIN ALFA 10000 UNIT/ML IJ SOLN: 20000.0000 [IU] | INTRAMUSCULAR | Status: DC

## 2015-12-28 LAB — POCT HEMOGLOBIN-HEMACUE: HEMOGLOBIN: 12 g/dL (ref 12.0–15.0)

## 2016-01-08 ENCOUNTER — Other Ambulatory Visit: Payer: Self-pay | Admitting: Cardiovascular Disease

## 2016-01-08 ENCOUNTER — Encounter (HOSPITAL_COMMUNITY)
Admission: RE | Admit: 2016-01-08 | Discharge: 2016-01-08 | Disposition: A | Discharge status: Home / Self Care | Payer: Commercial Managed Care - HMO | Source: Ambulatory Visit | Attending: Nephrology | Admitting: Nephrology

## 2016-01-08 DIAGNOSIS — D631 Anemia in chronic kidney disease: Secondary | ICD-10-CM | POA: Diagnosis not present

## 2016-01-08 LAB — POCT HEMOGLOBIN-HEMACUE: HEMOGLOBIN: 11.9 g/dL — AB (ref 12.0–15.0)

## 2016-01-08 MED ORDER — ATORVASTATIN CALCIUM 80 MG PO TABS: 80.0000 mg | ORAL_TABLET | Freq: Every day | ORAL | Status: DC

## 2016-01-08 MED ORDER — EPOETIN ALFA 10000 UNIT/ML IJ SOLN
20000.0000 [IU] | INTRAMUSCULAR | Status: DC
  Administered 2016-01-08: 20000 [IU] via SUBCUTANEOUS

## 2016-01-08 MED ORDER — EPOETIN ALFA 20000 UNIT/ML IJ SOLN
INTRAMUSCULAR | Status: AC
  Filled 2016-01-08: qty 1

## 2016-01-11 MED FILL — Epoetin Alfa Inj 20000 Unit/ML: INTRAMUSCULAR | Qty: 1 | Status: AC

## 2016-01-12 ENCOUNTER — Telehealth: Payer: Self-pay | Admitting: Cardiovascular Disease

## 2016-01-12 NOTE — Telephone Encounter (Signed)
°  New Prob   Pt is calling in to see Dr. Burt Knack. However, Dr. Burt Knack is currently booked through June. Offered first available with PA/NP but pt states she has not seen Dr. Burt Knack in 2 years and would rather hold off for an appointment with him. Requesting to speak to a nurse.

## 2016-01-12 NOTE — Telephone Encounter (Signed)
I spoke with the pt and she has been on Dr [facility] wait list for a few months.  Our office has contacted her at least 4 times to offer her an appointment and she has refused. I made the pt aware that we will continue to contact her for an appointment if Dr Burt Knack has any cancellations.  I offered the pt an appt with PA/NP but she refuses at this time.

## 2016-01-22 ENCOUNTER — Other Ambulatory Visit: Payer: Self-pay | Admitting: Cardiovascular Disease

## 2016-01-22 NOTE — Telephone Encounter (Signed)
Spoke with patient as she is overdue for an appointment. Patient has declined to schedule an appointment at this time as per documentation she has been declining to schedule an appointment. Her pcp recently checked a lipid panel and she stated that she will contact their office for the refill.

## 2016-01-24 ENCOUNTER — Other Ambulatory Visit: Payer: Self-pay | Admitting: Internal Medicine

## 2016-01-26 ENCOUNTER — Telehealth: Payer: Self-pay | Admitting: *Deleted

## 2016-01-26 NOTE — Telephone Encounter (Signed)
Notified [facility] spoke with [name] gave md response...[name]

## 2016-01-26 NOTE — Telephone Encounter (Signed)
Left msg on triage stating received script for clopidogrel, but their is a drug interaction with that & actos that pt is taking. It states can lead to hyperglycemia when taking together. Wanting to know if md is aware & is it ok to fill...Johny Chess

## 2016-01-26 NOTE — Telephone Encounter (Signed)
Ok to take both together, thanks

## 2016-02-04 ENCOUNTER — Other Ambulatory Visit: Payer: Self-pay | Admitting: Internal Medicine

## 2016-02-04 DIAGNOSIS — Z1231 Encounter for screening mammogram for malignant neoplasm of breast: Secondary | ICD-10-CM

## 2016-02-09 ENCOUNTER — Ambulatory Visit (HOSPITAL_BASED_OUTPATIENT_CLINIC_OR_DEPARTMENT_OTHER): Payer: Commercial Managed Care - HMO

## 2016-02-12 ENCOUNTER — Inpatient Hospital Stay (HOSPITAL_COMMUNITY): Admission: RE | Admit: 2016-02-12 | Payer: Commercial Managed Care - HMO | Source: Ambulatory Visit

## 2016-02-13 ENCOUNTER — Other Ambulatory Visit: Payer: Self-pay | Admitting: Cardiovascular Disease

## 2016-02-16 ENCOUNTER — Inpatient Hospital Stay (HOSPITAL_COMMUNITY): Admission: RE | Admit: 2016-02-16 | Payer: Commercial Managed Care - HMO | Source: Ambulatory Visit

## 2016-02-18 ENCOUNTER — Other Ambulatory Visit: Payer: Self-pay | Admitting: Internal Medicine

## 2016-02-20 ENCOUNTER — Other Ambulatory Visit: Payer: Self-pay | Admitting: Cardiovascular Disease

## 2016-02-21 ENCOUNTER — Other Ambulatory Visit: Payer: Self-pay | Admitting: Internal Medicine

## 2016-02-22 ENCOUNTER — Encounter (HOSPITAL_COMMUNITY)
Admission: RE | Admit: 2016-02-22 | Discharge: 2016-02-22 | Disposition: A | Discharge status: Home / Self Care | Payer: Medicare Other | Source: Ambulatory Visit | Attending: Nephrology | Admitting: Nephrology

## 2016-02-22 DIAGNOSIS — N184 Chronic kidney disease, stage 4 (severe): Secondary | ICD-10-CM | POA: Insufficient documentation

## 2016-02-22 DIAGNOSIS — D631 Anemia in chronic kidney disease: Secondary | ICD-10-CM | POA: Diagnosis not present

## 2016-02-22 LAB — IRON AND TIBC
IRON: 74 ug/dL (ref 28–170)
SATURATION RATIOS: 24 % (ref 10.4–31.8)
TIBC: 314 ug/dL (ref 250–450)
UIBC: 240 ug/dL

## 2016-02-22 LAB — FERRITIN: FERRITIN: 673 ng/mL — AB (ref 11–307)

## 2016-02-22 LAB — POCT HEMOGLOBIN-HEMACUE: HEMOGLOBIN: 11.9 g/dL — AB (ref 12.0–15.0)

## 2016-02-22 MED ORDER — EPOETIN ALFA 10000 UNIT/ML IJ SOLN
20000.0000 [IU] | INTRAMUSCULAR | Status: DC
Start: 2016-02-22 — End: 2016-02-23

## 2016-02-22 MED ORDER — EPOETIN ALFA 20000 UNIT/ML IJ SOLN
INTRAMUSCULAR | Status: AC
  Administered 2016-02-22: 20000 [IU] via SUBCUTANEOUS
  Filled 2016-02-22: qty 1

## 2016-02-29 ENCOUNTER — Ambulatory Visit (INDEPENDENT_AMBULATORY_CARE_PROVIDER_SITE_OTHER): Payer: Medicare Other | Admitting: Ophthalmology

## 2016-02-29 DIAGNOSIS — H33302 Unspecified retinal break, left eye: Secondary | ICD-10-CM

## 2016-02-29 DIAGNOSIS — I10 Essential (primary) hypertension: Secondary | ICD-10-CM | POA: Diagnosis not present

## 2016-02-29 DIAGNOSIS — H43813 Vitreous degeneration, bilateral: Secondary | ICD-10-CM | POA: Diagnosis not present

## 2016-02-29 DIAGNOSIS — H35033 Hypertensive retinopathy, bilateral: Secondary | ICD-10-CM

## 2016-03-04 ENCOUNTER — Other Ambulatory Visit: Payer: Self-pay | Admitting: Internal Medicine

## 2016-03-07 DIAGNOSIS — E1129 Type 2 diabetes mellitus with other diabetic kidney complication: Secondary | ICD-10-CM | POA: Diagnosis not present

## 2016-03-07 DIAGNOSIS — I129 Hypertensive chronic kidney disease with stage 1 through stage 4 chronic kidney disease, or unspecified chronic kidney disease: Secondary | ICD-10-CM | POA: Diagnosis not present

## 2016-03-07 DIAGNOSIS — N184 Chronic kidney disease, stage 4 (severe): Secondary | ICD-10-CM | POA: Diagnosis not present

## 2016-03-07 DIAGNOSIS — D631 Anemia in chronic kidney disease: Secondary | ICD-10-CM | POA: Diagnosis not present

## 2016-03-07 DIAGNOSIS — N2581 Secondary hyperparathyroidism of renal origin: Secondary | ICD-10-CM | POA: Diagnosis not present

## 2016-03-10 DIAGNOSIS — N183 Chronic kidney disease, stage 3 (moderate): Secondary | ICD-10-CM | POA: Diagnosis not present

## 2016-03-10 DIAGNOSIS — M1A09X0 Idiopathic chronic gout, multiple sites, without tophus (tophi): Secondary | ICD-10-CM | POA: Diagnosis not present

## 2016-03-18 ENCOUNTER — Other Ambulatory Visit: Payer: Self-pay | Admitting: Internal Medicine

## 2016-03-18 DIAGNOSIS — I6523 Occlusion and stenosis of bilateral carotid arteries: Secondary | ICD-10-CM

## 2016-03-25 ENCOUNTER — Ambulatory Visit: Payer: Commercial Managed Care - HMO | Admitting: Internal Medicine

## 2016-03-25 DIAGNOSIS — Z0289 Encounter for other administrative examinations: Secondary | ICD-10-CM

## 2016-03-27 ENCOUNTER — Other Ambulatory Visit: Payer: Self-pay | Admitting: Internal Medicine

## 2016-03-28 ENCOUNTER — Encounter (HOSPITAL_COMMUNITY): Payer: Commercial Managed Care - HMO

## 2016-03-29 ENCOUNTER — Encounter: Payer: Self-pay | Admitting: Cardiovascular Disease

## 2016-03-29 ENCOUNTER — Ambulatory Visit (INDEPENDENT_AMBULATORY_CARE_PROVIDER_SITE_OTHER): Payer: Medicare Other | Admitting: Cardiovascular Disease

## 2016-03-29 VITALS — BP 116/58 | HR 62 | Ht 61.0 in | Wt 198.4 lb

## 2016-03-29 DIAGNOSIS — I25118 Atherosclerotic heart disease of native coronary artery with other forms of angina pectoris: Secondary | ICD-10-CM | POA: Diagnosis not present

## 2016-03-29 DIAGNOSIS — I2510 Atherosclerotic heart disease of native coronary artery without angina pectoris: Secondary | ICD-10-CM

## 2016-03-29 NOTE — Patient Instructions (Signed)

## 2016-03-29 NOTE — Progress Notes (Signed)
Cardiology Office Note Date:  03/29/2016   ID:  Jaicee, Wallman 1946/02/16, MRN [id number]  PCP:  Cathlean Cower, MD  Cardiologist:  Sherren Mocha, MD    Chief Complaint  Patient presents with  . Coronary Artery Disease    has some shortness of breath. no chest pain,lee,or claudication     History of Present Illness: [name] is a 70 y.o. female who presents for Follow-up of coronary artery disease. She has a history of drug-eluting stent implantation in the circumflex in 2004. She also has known renal artery stenosis with occlusion of the left renal artery and an atrophic left kidney. Other comorbid conditions include chronic kidney disease, type 2 diabetes, hypertension, hyperlipidemia, and carotid stenosis.  The patient is here alone today. She hasn't been seen since 2015. She denies any new cardiac problems. She reports episodes of tightness in her mid-back 'every once in a while.' Also complains of shortness of breath with exertion such as sweeping the floor. She stops and takes breaks every few minutes. At times she has dizziness and balance problems. Overall she feels symptoms are stable and doesn't feel like symptoms have worsened in severity or frequency over time.   Past Medical History  Diagnosis Date  . Glaucoma   . Mitral regurgitation   . DM2 (diabetes mellitus, type 2) ([place])   . GERD (gastroesophageal reflux disease)   . HLD (hyperlipidemia)   . HTN (hypertension)   . PUD (peptic ulcer disease)   . Anemia   . Carotid stenosis   . Osteoporosis   . PVD (peripheral vascular disease) ([place])   . Anemia, iron deficiency   . CAD (coronary artery disease)   . Shortness of breath     with exertion  . Chronic kidney disease     chronic renal failure  . Arthritis   . Carotid stenosis     Carotid US (02/2014):  Bilateral ICA 40-59%; > 50% L ECA; F/u 1 year  . Hx of cardiovascular stress test     Lexiscan Myoview (03/24/14):  No ischemia, EF 73%, Normal     Past Surgical History  Procedure Laterality Date  . Appendectomy    . Hysterectomy - unknown type    . Oophorectomy    . Abdominal hysterectomy    . Av fistula placement Right 01/18/2013    Procedure: ARTERIOVENOUS (AV) FISTULA CREATION;  Surgeon: Rosetta Posner, MD;  Location: [facility];  Service: Vascular;  Laterality: Right;  Ultrasound guided  . Renal angiogram N/A 09/16/2011    Procedure: RENAL ANGIOGRAM;  Surgeon: Sherren Mocha, MD;  Location: [facility] CATH LAB;  Service: Cardiovascular;  Laterality: N/A;    Current Outpatient Prescriptions  Medication Sig Dispense Refill  . Acetaminophen-Codeine 300-30 MG per tablet Take 1 tablet by mouth every 4 (four) hours as needed for pain. 30 tablet 0  . amLODipine (NORVASC) 10 MG tablet Take 10 mg by mouth daily.    Marland Kitchen aspirin 81 MG tablet Take 81 mg by mouth daily.    Marland Kitchen atorvastatin (LIPITOR) 80 MG tablet Take 1 tablet (80 mg total) by mouth daily at 6 PM. 15 tablet 0  . BD PEN NEEDLE NANO U/F 32G X 4 MM MISC USE DAILY WITH LANTUS SOLASTAR 100 each 11  . BD PEN NEEDLE NANO U/F 32G X 4 MM MISC USE DAILY WITH LANTUS SOLASTAR 100 each 1  . calcitRIOL (ROCALTROL) 0.5 MCG capsule Take 0.5 mcg by mouth daily.  6  . clopidogrel (  PLAVIX) 75 MG tablet Take 75 mg by mouth daily.    . colchicine (COLCRYS) 0.6 MG tablet Take 1 tablet (0.6 mg total) by mouth daily. 30 tablet 2  . ferrous sulfate 325 (65 FE) MG tablet TAKE 1 TABLET (325 MG TOTAL) BY MOUTH 2 (TWO) TIMES DAILY. 60 tablet 2  . furosemide (LASIX) 80 MG tablet Take 1 tablet (80 mg total) by mouth 2 (two) times daily. 180 tablet 3  . isosorbide mononitrate (IMDUR) 30 MG 24 hr tablet TAKE 1 TABLET (30 MG TOTAL) BY MOUTH DAILY. 14 tablet 0  . JANUVIA 100 MG tablet TAKE 1 TABLET BY MOUTH EVERY DAY 90 tablet 3  . LANTUS SOLOSTAR 100 UNIT/ML Solostar Pen INJECT 10 UNITS INTO THE SKIN DAILY 2 pen 3  . metoprolol (LOPRESSOR) 100 MG tablet TAKE 1 TABLET BY MOUTH TWICE A DAY 180 tablet 1  . nitroGLYCERIN  (NITROSTAT) 0.4 MG SL tablet Place 0.4 mg under the tongue every 5 (five) minutes as needed for chest pain.    Marland Kitchen omeprazole (PRILOSEC) 20 MG capsule TAKE 1 CAPSULE (20 MG TOTAL) BY MOUTH DAILY. 90 capsule 1  . oxyCODONE (ROXICODONE) 5 MG immediate release tablet Take 1 tablet (5 mg total) by mouth every 6 (six) hours as needed for pain. 30 tablet 0  . pioglitazone (ACTOS) 45 MG tablet TAKE 1 TABLET BY MOUTH EVERY DAY 90 tablet 1  . risedronate (ACTONEL) 150 MG tablet TAKE 1 TABLET BY MOUTH ONCE A MONTH-TAKE ON THE SAME DAY OF EACH MONTH. 1 tablet 5  . timolol (BETIMOL) 0.5 % ophthalmic solution Place 1 drop into both eyes daily.     Marland Kitchen ULORIC 80 MG TABS Take 1 tablet by mouth daily. For gout management  3  . vitamin B-12 (CYANOCOBALAMIN) 1000 MCG tablet Take 1,000 mcg by mouth daily.    Marland Kitchen ZETIA 10 MG tablet TAKE 1 TABLET (10 MG TOTAL) BY MOUTH DAILY. 90 tablet 1   No current facility-administered medications for this visit.    Allergies:   Review of patient's allergies indicates no known allergies.   Social History:  The patient  reports that she quit smoking about 8 years ago. She has never used smokeless tobacco. She reports that she does not drink alcohol or use illicit drugs.   Family History:  The patient's family history includes Diabetes in her sister and son; Glaucoma in her father; Heart disease in her father.   ROS:  Please see the history of present illness.  Otherwise, review of systems is positive for low back pain, knee pain.  All other systems are reviewed and negative.   PHYSICAL EXAM: VS:  BP 116/58 mmHg  Pulse 62  Ht 5\' 1"  (1.549 m)  Wt 198 lb 6.4 oz (89.994 kg)  BMI 37.51 kg/m2 , BMI Body mass index is 37.51 kg/(m^2). GEN: Pleasant, morbidly obese woman, in no acute distress HEENT: normal Neck: no JVD, no masses. bilateral carotid bruits Cardiac: RRR with soft SEM at the RUSB              Respiratory:  clear to auscultation bilaterally, normal work of breathing GI:  soft, nontender, nondistended, + BS MS: no deformity or atrophy Ext: no pretibial edema Skin: warm and dry, no rash Neuro:  Strength and sensation are intact Psych: euthymic mood, full affect  EKG:  EKG is ordered today. The ekg ordered today shows sinus rhythm 59 bpm, age-indeterminate septal infarct  Recent Labs: 02/22/2016: Hemoglobin 11.9*   Lipid  Panel     Component Value Date/Time   CHOL 208* 03/18/2015 1431   TRIG 207.0* 03/18/2015 1431   HDL 32.40* 03/18/2015 1431   CHOLHDL 6 03/18/2015 1431   VLDL 41.4* 03/18/2015 1431   LDLCALC 61 03/13/2014 0957   LDLDIRECT 116.0 03/18/2015 1431      Wt Readings from Last 3 Encounters:  03/29/16 198 lb 6.4 oz (89.994 kg)  09/25/15 210 lb (95.255 kg)  03/18/15 197 lb 0.6 oz (89.377 kg)     Cardiac Studies Reviewed: Nuclear stress scan 03/24/2014: QPS Raw Data Images: Normal; no motion artifact; normal heart/lung ratio. Stress Images: Normal homogeneous uptake in all areas of the myocardium. Rest Images: Normal homogeneous uptake in all areas of the myocardium. Subtraction (SDS): No evidence of ischemia. Transient Ischemic Dilatation (Normal <1.22): 1.08  Lung/Heart Ratio (Normal <0.45): 0.72  Quantitative Gated Spect Images QGS EDV: 77 ml QGS ESV: 21 ml  Impression Exercise Capacity: Lexiscan with low level exercise. BP Response: Normal blood pressure response. Clinical Symptoms: No significant symptoms noted. ECG Impression: No significant ST segment change suggestive of ischemia. Comparison with Prior Nuclear Study: No images to compare  Overall Impression: Normal stress nuclear study.  LV Ejection Fraction: 73%. LV Wall Motion: NL LV Function; NL Wall Motion  Carotid duplex scan 03/24/2014: 40-59% bilateral ICA stenosis stable from previous studies  ASSESSMENT AND PLAN: 1.  CAD, native vessel, with stable infrequent episodes of angina. The patient will continue on her current medical program.  There has been no progression of her anginal symptoms.  2. Hypertension with chronic kidney disease: Blood pressure is well controlled. Medications are reviewed.  3. Carotid stenosis without history of stroke: Most recent duplex scan reviewed from 2015. The patient has bilateral bruits on exam. Will update her carotid ultrasound study.  4. Renal artery stenosis: total occlusion of the left renal artery with an atrophic kidney. CKD followed by Dr Mercy Moore.   5. Hyperlipidemia: treated with atorvastatin and zetia.   Current medicines are reviewed with the patient today.  The patient does not have concerns regarding medicines.  Labs/ tests ordered today include:  No orders of the defined types were placed in this encounter.    Disposition:   FU one year  Signed, Sherren Mocha, MD  03/29/2016 1:52 PM    [facility] [place], [place], [place]  [address] Phone: [phone]; Fax: [phone]

## 2016-04-01 ENCOUNTER — Encounter (HOSPITAL_COMMUNITY)
Admission: RE | Admit: 2016-04-01 | Discharge: 2016-04-01 | Disposition: A | Discharge status: Home / Self Care | Payer: Medicare Other | Source: Ambulatory Visit | Attending: Nephrology | Admitting: Nephrology

## 2016-04-01 DIAGNOSIS — D631 Anemia in chronic kidney disease: Secondary | ICD-10-CM | POA: Diagnosis not present

## 2016-04-01 DIAGNOSIS — N184 Chronic kidney disease, stage 4 (severe): Secondary | ICD-10-CM | POA: Diagnosis not present

## 2016-04-01 LAB — POCT HEMOGLOBIN-HEMACUE: HEMOGLOBIN: 11.4 g/dL — AB (ref 12.0–15.0)

## 2016-04-01 MED ORDER — EPOETIN ALFA 20000 UNIT/ML IJ SOLN
INTRAMUSCULAR | Status: AC
  Filled 2016-04-01: qty 1

## 2016-04-01 MED ORDER — EPOETIN ALFA 10000 UNIT/ML IJ SOLN
20000.0000 [IU] | INTRAMUSCULAR | Status: DC
  Administered 2016-04-01: 20000 [IU] via SUBCUTANEOUS

## 2016-04-04 MED FILL — Epoetin Alfa Inj 20000 Unit/ML: INTRAMUSCULAR | Qty: 1 | Status: AC

## 2016-04-05 ENCOUNTER — Ambulatory Visit (HOSPITAL_COMMUNITY)
Admission: RE | Admit: 2016-04-05 | Discharge: 2016-04-05 | Disposition: A | Discharge status: Home / Self Care | Payer: Medicare Other | Source: Ambulatory Visit | Attending: Cardiovascular Disease | Admitting: Cardiovascular Disease

## 2016-04-05 DIAGNOSIS — K219 Gastro-esophageal reflux disease without esophagitis: Secondary | ICD-10-CM | POA: Diagnosis not present

## 2016-04-05 DIAGNOSIS — I129 Hypertensive chronic kidney disease with stage 1 through stage 4 chronic kidney disease, or unspecified chronic kidney disease: Secondary | ICD-10-CM | POA: Diagnosis not present

## 2016-04-05 DIAGNOSIS — E785 Hyperlipidemia, unspecified: Secondary | ICD-10-CM | POA: Diagnosis not present

## 2016-04-05 DIAGNOSIS — I2510 Atherosclerotic heart disease of native coronary artery without angina pectoris: Secondary | ICD-10-CM | POA: Diagnosis not present

## 2016-04-05 DIAGNOSIS — E1122 Type 2 diabetes mellitus with diabetic chronic kidney disease: Secondary | ICD-10-CM | POA: Diagnosis not present

## 2016-04-05 DIAGNOSIS — I6523 Occlusion and stenosis of bilateral carotid arteries: Secondary | ICD-10-CM

## 2016-04-05 DIAGNOSIS — N189 Chronic kidney disease, unspecified: Secondary | ICD-10-CM | POA: Diagnosis not present

## 2016-04-06 ENCOUNTER — Encounter: Payer: Self-pay | Admitting: Internal Medicine

## 2016-04-06 ENCOUNTER — Ambulatory Visit (INDEPENDENT_AMBULATORY_CARE_PROVIDER_SITE_OTHER): Payer: Medicare Other | Admitting: Internal Medicine

## 2016-04-06 VITALS — BP 152/60 | HR 65 | Temp 97.9°F | Ht 61.0 in | Wt 200.0 lb

## 2016-04-06 DIAGNOSIS — R6889 Other general symptoms and signs: Secondary | ICD-10-CM

## 2016-04-06 DIAGNOSIS — Z0001 Encounter for general adult medical examination with abnormal findings: Secondary | ICD-10-CM

## 2016-04-06 DIAGNOSIS — I10 Essential (primary) hypertension: Secondary | ICD-10-CM | POA: Diagnosis not present

## 2016-04-06 DIAGNOSIS — E119 Type 2 diabetes mellitus without complications: Secondary | ICD-10-CM | POA: Diagnosis not present

## 2016-04-06 DIAGNOSIS — K641 Second degree hemorrhoids: Secondary | ICD-10-CM

## 2016-04-06 DIAGNOSIS — Z1159 Encounter for screening for other viral diseases: Secondary | ICD-10-CM

## 2016-04-06 DIAGNOSIS — E785 Hyperlipidemia, unspecified: Secondary | ICD-10-CM | POA: Diagnosis not present

## 2016-04-06 MED ORDER — GLUCOSE BLOOD VI STRP: ORAL_STRIP | Status: DC

## 2016-04-06 MED ORDER — BLOOD GLUCOSE MONITOR KIT: PACK | Status: AC

## 2016-04-06 MED ORDER — LANCETS MISC: Status: DC

## 2016-04-06 NOTE — Progress Notes (Signed)
Pre visit review using our clinic review tool, if applicable. No additional management support is needed unless otherwise documented below in the visit note. 

## 2016-04-06 NOTE — Patient Instructions (Signed)
The prescription for the glucometer and the strips/lancets were sent to the pharmacy  Please continue all other medications as before, and refills have been done if requested.  Please have the pharmacy call with any other refills you may need.  Please continue your efforts at being more active, low cholesterol diet, and weight control.  You are otherwise up to date with prevention measures today.  Please keep your appointments with your specialists as you may have planned  You will be contacted regarding the referral for: Gastroenterology  Please go to the LAB in the Basement (turn left off the elevator) for the tests to be done today  You will be contacted by phone if any changes need to be made immediately.  Otherwise, you will receive a letter about your results with an explanation, but please check with MyChart first.  Please remember to sign up for MyChart if you have not done so, as this will be important to you in the future with finding out test results, communicating by private email, and scheduling acute appointments online when needed.  Please return in 6 months, or sooner if needed, with Lab testing done 3-5 days before

## 2016-04-06 NOTE — Progress Notes (Signed)
Subjective:    Patient ID: [name], female    DOB: 11-08-1945, 70 y.o.   MRN: [id number]  HPI  Here for wellness and f/u;  Overall doing ok;  Pt denies Chest pain, worsening SOB, DOE, wheezing, orthopnea, PND, worsening LE edema, palpitations, dizziness or syncope.  Pt denies neurological change such as new headache, facial or extremity weakness.. Pt states overall good compliance with treatment and medications, good tolerability, and has been trying to follow appropriate diet.  Pt denies worsening depressive symptoms, suicidal ideation or panic. No fever, night sweats, wt loss, loss of appetite, or other constitutional symptoms.  Pt states good ability with ADL's, has low fall risk, home safety reviewed and adequate, no other significant changes in hearing or vision, and only occasionally active with exercise.  Denies worsening reflux, abd pain, dysphagia, n/v, bowel change or blood., but has significant anal discomfort with sitting and asks for GI referral for hemorrhoid.  Pt denies polydipsia, polyuria, or low sugar symptoms such as weakness or confusion improved with po intake.  Pt states overall good compliance with meds, trying to follow lower cholesterol, diabetic diet, wt overall stable but little exercise however.  Past Medical History  Diagnosis Date  . Glaucoma   . Mitral regurgitation   . DM2 (diabetes mellitus, type 2) ([place])   . GERD (gastroesophageal reflux disease)   . HLD (hyperlipidemia)   . HTN (hypertension)   . PUD (peptic ulcer disease)   . Anemia   . Carotid stenosis   . Osteoporosis   . PVD (peripheral vascular disease) ([place])   . Anemia, iron deficiency   . CAD (coronary artery disease)   . Shortness of breath     with exertion  . Chronic kidney disease     chronic renal failure  . Arthritis   . Carotid stenosis     Carotid US (02/2014):  Bilateral ICA 40-59%; > 50% L ECA; F/u 1 year  . Hx of cardiovascular stress test     Lexiscan Myoview (03/24/14):  No  ischemia, EF 73%, Normal   Past Surgical History  Procedure Laterality Date  . Appendectomy    . Hysterectomy - unknown type    . Oophorectomy    . Abdominal hysterectomy    . Av fistula placement Right 01/18/2013    Procedure: ARTERIOVENOUS (AV) FISTULA CREATION;  Surgeon: Rosetta Posner, MD;  Location: [facility];  Service: Vascular;  Laterality: Right;  Ultrasound guided  . Renal angiogram N/A 09/16/2011    Procedure: RENAL ANGIOGRAM;  Surgeon: Sherren Mocha, MD;  [facility] CATH LAB;  Service: Cardiovascular;  Laterality: N/A;    reports that she quit smoking about 8 years ago. She has never used smokeless tobacco. She reports that she does not drink alcohol or use illicit drugs. family history includes Diabetes in her sister and son; Glaucoma in her father; Heart disease in her father. No Known Allergies Current Outpatient Prescriptions on File Prior to Visit  Medication Sig Dispense Refill  . amLODipine (NORVASC) 10 MG tablet Take 10 mg by mouth daily.    Marland Kitchen aspirin 81 MG tablet Take 81 mg by mouth daily.    Marland Kitchen atorvastatin (LIPITOR) 80 MG tablet Take 1 tablet (80 mg total) by mouth daily at 6 PM. 15 tablet 0  . BD PEN NEEDLE NANO U/F 32G X 4 MM MISC USE DAILY WITH LANTUS SOLASTAR 100 each 11  . BD PEN NEEDLE NANO U/F 32G X 4 MM MISC USE DAILY  WITH LANTUS SOLASTAR 100 each 1  . calcitRIOL (ROCALTROL) 0.5 MCG capsule Take 0.5 mcg by mouth daily.  6  . clopidogrel (PLAVIX) 75 MG tablet Take 75 mg by mouth daily.    . ferrous sulfate 325 (65 FE) MG tablet TAKE 1 TABLET (325 MG TOTAL) BY MOUTH 2 (TWO) TIMES DAILY. 60 tablet 2  . furosemide (LASIX) 80 MG tablet Take 1 tablet (80 mg total) by mouth 2 (two) times daily. 180 tablet 3  . LANTUS SOLOSTAR 100 UNIT/ML Solostar Pen INJECT 10 UNITS INTO THE SKIN DAILY 2 pen 3  . metoprolol (LOPRESSOR) 100 MG tablet TAKE 1 TABLET BY MOUTH TWICE A DAY 180 tablet 1  . omeprazole (PRILOSEC) 20 MG capsule TAKE 1 CAPSULE (20 MG TOTAL) BY MOUTH DAILY. 90  capsule 1  . pioglitazone (ACTOS) 45 MG tablet TAKE 1 TABLET BY MOUTH EVERY DAY 90 tablet 1  . risedronate (ACTONEL) 150 MG tablet TAKE 1 TABLET BY MOUTH ONCE A MONTH-TAKE ON THE SAME DAY OF EACH MONTH. 1 tablet 5  . timolol (BETIMOL) 0.5 % ophthalmic solution Place 1 drop into both eyes daily.     Marland Kitchen ULORIC 80 MG TABS Take 1 tablet by mouth daily. For gout management  3  . vitamin B-12 (CYANOCOBALAMIN) 1000 MCG tablet Take 1,000 mcg by mouth daily.    . Acetaminophen-Codeine 300-30 MG per tablet Take 1 tablet by mouth every 4 (four) hours as needed for pain. 30 tablet 0  . colchicine (COLCRYS) 0.6 MG tablet Take 1 tablet (0.6 mg total) by mouth daily. (Patient not taking: Reported on 04/06/2016) 30 tablet 2  . isosorbide mononitrate (IMDUR) 30 MG 24 hr tablet TAKE 1 TABLET (30 MG TOTAL) BY MOUTH DAILY. (Patient not taking: Reported on 04/06/2016) 14 tablet 0  . nitroGLYCERIN (NITROSTAT) 0.4 MG SL tablet Place 0.4 mg under the tongue every 5 (five) minutes as needed for chest pain. Reported on 04/06/2016    . oxyCODONE (ROXICODONE) 5 MG immediate release tablet Take 1 tablet (5 mg total) by mouth every 6 (six) hours as needed for pain. (Patient not taking: Reported on 04/06/2016) 30 tablet 0  . ZETIA 10 MG tablet TAKE 1 TABLET (10 MG TOTAL) BY MOUTH DAILY. (Patient not taking: Reported on 04/06/2016) 90 tablet 1   No current facility-administered medications on file prior to visit.   Review of Systems Constitutional: Negative for increased diaphoresis, or other activity, appetite or siginficant weight change other than noted HENT: Negative for worsening hearing loss, ear pain, facial swelling, mouth sores and neck stiffness.   Eyes: Negative for other worsening pain, redness or visual disturbance.  Respiratory: Negative for choking or stridor Cardiovascular: Negative for other chest pain and palpitations.  Gastrointestinal: Negative for worsening diarrhea, blood in stool, or abdominal  distention Genitourinary: Negative for hematuria, flank pain or change in urine volume.  Musculoskeletal: Negative for myalgias or other joint complaints.  Skin: Negative for other color change and wound or drainage.  Neurological: Negative for syncope and numbness. other than noted Hematological: Negative for adenopathy. or other swelling Psychiatric/Behavioral: Negative for hallucinations, SI, self-injury, decreased concentration or other worsening agitation.      Objective:   Physical Exam BP 152/60 mmHg  Pulse 65  Temp(Src) 97.9 F (36.6 C) (Oral)  Ht 5\' 1"  (1.549 m)  Wt 200 lb (90.719 kg)  BMI 37.81 kg/m2  SpO2 95% VS noted,  Constitutional: Pt is oriented to person, place, and time. Appears well-developed and well-nourished, in no  significant distress Head: Normocephalic and atraumatic  Eyes: Conjunctivae and EOM are normal. Pupils are equal, round, and reactive to light Right Ear: External ear normal.  Left Ear: External ear normal Nose: Nose normal.  Mouth/Throat: Oropharynx is clear and moist  Neck: Normal range of motion. Neck supple. No JVD present. No tracheal deviation present or significant neck LA or mass Cardiovascular: Normal rate, regular rhythm, normal heart sounds and intact distal pulses.   Pulmonary/Chest: Effort normal and breath sounds without rales or wheezing  Abdominal: Soft. Bowel sounds are normal. NT. No HSM  Musculoskeletal: Normal range of motion. Exhibits no edema Lymphadenopathy: Has no cervical adenopathy.  Neurological: Pt is alert and oriented to person, place, and time. Pt has normal reflexes. No cranial nerve deficit. Motor grossly intact Skin: Skin is warm and dry. No rash noted or new ulcers Psychiatric:  Has normal mood and affect. Behavior is normal.     Assessment & Plan:

## 2016-04-10 DIAGNOSIS — K641 Second degree hemorrhoids: Secondary | ICD-10-CM | POA: Insufficient documentation

## 2016-04-10 NOTE — Assessment & Plan Note (Signed)

## 2016-04-10 NOTE — Assessment & Plan Note (Signed)
stable overall by history and exam, recent data reviewed with pt, and pt to continue medical treatment as before,  to f/u any worsening symptoms or concerns Lab Results  Component Value Date   HGBA1C 6.1 03/18/2015

## 2016-04-10 NOTE — Assessment & Plan Note (Addendum)
Mild to mod, for GI referral as requested,  to f/u any worsening symptoms or concerns  In addition to the time spent performing CPE, I spent an additional 15 minutes face to face,in which greater than 50% of this time was spent in counseling and coordination of care for patient's illness as documented.

## 2016-04-10 NOTE — Assessment & Plan Note (Signed)
stable overall by history and exam, recent data reviewed with pt, and pt to continue medical treatment as before,  to f/u any worsening symptoms or concerns BP Readings from Last 3 Encounters:  04/06/16 152/60  04/01/16 127/48  03/29/16 116/58

## 2016-04-10 NOTE — Assessment & Plan Note (Signed)
Lab Results  Component Value Date   LDLCALC 61 03/13/2014   stable overall by history and exam, recent data reviewed with pt, and pt to continue medical treatment as before,  to f/u any worsening symptoms or concerns

## 2016-04-14 ENCOUNTER — Encounter: Payer: Self-pay | Admitting: Internal Medicine

## 2016-04-19 ENCOUNTER — Other Ambulatory Visit: Payer: Self-pay | Admitting: Internal Medicine

## 2016-05-01 ENCOUNTER — Other Ambulatory Visit: Payer: Self-pay | Admitting: Internal Medicine

## 2016-05-05 DIAGNOSIS — H5500 Unspecified nystagmus: Secondary | ICD-10-CM | POA: Diagnosis not present

## 2016-05-05 DIAGNOSIS — Q131 Absence of iris: Secondary | ICD-10-CM | POA: Diagnosis not present

## 2016-05-05 DIAGNOSIS — H401114 Primary open-angle glaucoma, right eye, indeterminate stage: Secondary | ICD-10-CM | POA: Diagnosis not present

## 2016-05-05 DIAGNOSIS — H401124 Primary open-angle glaucoma, left eye, indeterminate stage: Secondary | ICD-10-CM | POA: Diagnosis not present

## 2016-05-06 ENCOUNTER — Encounter (HOSPITAL_COMMUNITY)
Admission: RE | Admit: 2016-05-06 | Discharge: 2016-05-06 | Disposition: A | Discharge status: Home / Self Care | Payer: Medicare Other | Source: Ambulatory Visit | Attending: Nephrology | Admitting: Nephrology

## 2016-05-06 DIAGNOSIS — N184 Chronic kidney disease, stage 4 (severe): Secondary | ICD-10-CM | POA: Diagnosis not present

## 2016-05-06 DIAGNOSIS — D631 Anemia in chronic kidney disease: Secondary | ICD-10-CM | POA: Diagnosis not present

## 2016-05-06 LAB — IRON AND TIBC
Iron: 76 ug/dL (ref 28–170)
Saturation Ratios: 23 % (ref 10.4–31.8)
TIBC: 329 ug/dL (ref 250–450)
UIBC: 253 ug/dL

## 2016-05-06 LAB — FERRITIN: FERRITIN: 737 ng/mL — AB (ref 11–307)

## 2016-05-06 LAB — POCT HEMOGLOBIN-HEMACUE: HEMOGLOBIN: 11.7 g/dL — AB (ref 12.0–15.0)

## 2016-05-06 MED ORDER — EPOETIN ALFA 20000 UNIT/ML IJ SOLN
INTRAMUSCULAR | Status: AC
  Administered 2016-05-06: 20000 [IU] via SUBCUTANEOUS
  Filled 2016-05-06: qty 1

## 2016-05-06 MED ORDER — EPOETIN ALFA 10000 UNIT/ML IJ SOLN: 20000.0000 [IU] | INTRAMUSCULAR | Status: DC

## 2016-05-11 ENCOUNTER — Other Ambulatory Visit: Payer: Self-pay

## 2016-05-11 MED ORDER — GLUCOSE BLOOD VI STRP: ORAL_STRIP | Status: DC

## 2016-05-19 ENCOUNTER — Other Ambulatory Visit: Payer: Self-pay | Admitting: Nephrology

## 2016-05-19 DIAGNOSIS — D539 Nutritional anemia, unspecified: Secondary | ICD-10-CM | POA: Insufficient documentation

## 2016-05-19 DIAGNOSIS — D638 Anemia in other chronic diseases classified elsewhere: Secondary | ICD-10-CM

## 2016-05-20 ENCOUNTER — Encounter (HOSPITAL_COMMUNITY): Payer: Medicare Other

## 2016-05-25 ENCOUNTER — Encounter (HOSPITAL_COMMUNITY): Payer: Medicare Other

## 2016-05-27 ENCOUNTER — Encounter (HOSPITAL_COMMUNITY)
Admission: RE | Admit: 2016-05-27 | Discharge: 2016-05-27 | Disposition: A | Discharge status: Home / Self Care | Payer: Medicare Other | Source: Ambulatory Visit | Attending: Nephrology | Admitting: Nephrology

## 2016-05-27 DIAGNOSIS — D631 Anemia in chronic kidney disease: Secondary | ICD-10-CM | POA: Insufficient documentation

## 2016-05-27 DIAGNOSIS — D638 Anemia in other chronic diseases classified elsewhere: Secondary | ICD-10-CM

## 2016-05-27 DIAGNOSIS — N184 Chronic kidney disease, stage 4 (severe): Secondary | ICD-10-CM | POA: Insufficient documentation

## 2016-05-27 MED ORDER — EPOETIN ALFA 10000 UNIT/ML IJ SOLN: 20000.0000 [IU] | INTRAMUSCULAR | Status: DC

## 2016-06-01 ENCOUNTER — Other Ambulatory Visit: Payer: Self-pay | Admitting: Internal Medicine

## 2016-06-10 ENCOUNTER — Encounter (HOSPITAL_COMMUNITY)
Admission: RE | Admit: 2016-06-10 | Discharge: 2016-06-10 | Disposition: A | Discharge status: Home / Self Care | Payer: Medicare Other | Source: Ambulatory Visit | Attending: Nephrology | Admitting: Nephrology

## 2016-06-10 DIAGNOSIS — D638 Anemia in other chronic diseases classified elsewhere: Secondary | ICD-10-CM

## 2016-06-10 DIAGNOSIS — D631 Anemia in chronic kidney disease: Secondary | ICD-10-CM | POA: Diagnosis not present

## 2016-06-10 DIAGNOSIS — N184 Chronic kidney disease, stage 4 (severe): Secondary | ICD-10-CM | POA: Diagnosis not present

## 2016-06-10 LAB — POCT HEMOGLOBIN-HEMACUE: Hemoglobin: 10.6 g/dL — ABNORMAL LOW (ref 12.0–15.0)

## 2016-06-10 MED ORDER — EPOETIN ALFA 10000 UNIT/ML IJ SOLN: 20000.0000 [IU] | INTRAMUSCULAR | Status: DC

## 2016-06-10 MED ORDER — EPOETIN ALFA 20000 UNIT/ML IJ SOLN
INTRAMUSCULAR | Status: AC
  Administered 2016-06-10: 20000 [IU]
  Filled 2016-06-10: qty 1

## 2016-06-20 DIAGNOSIS — N184 Chronic kidney disease, stage 4 (severe): Secondary | ICD-10-CM | POA: Diagnosis not present

## 2016-06-20 DIAGNOSIS — I129 Hypertensive chronic kidney disease with stage 1 through stage 4 chronic kidney disease, or unspecified chronic kidney disease: Secondary | ICD-10-CM | POA: Diagnosis not present

## 2016-06-20 DIAGNOSIS — D631 Anemia in chronic kidney disease: Secondary | ICD-10-CM | POA: Diagnosis not present

## 2016-06-20 DIAGNOSIS — E1129 Type 2 diabetes mellitus with other diabetic kidney complication: Secondary | ICD-10-CM | POA: Diagnosis not present

## 2016-06-20 DIAGNOSIS — N2581 Secondary hyperparathyroidism of renal origin: Secondary | ICD-10-CM | POA: Diagnosis not present

## 2016-06-22 ENCOUNTER — Ambulatory Visit (INDEPENDENT_AMBULATORY_CARE_PROVIDER_SITE_OTHER): Payer: Medicare Other | Admitting: Internal Medicine

## 2016-06-22 ENCOUNTER — Encounter: Payer: Self-pay | Admitting: Internal Medicine

## 2016-06-22 ENCOUNTER — Telehealth: Payer: Self-pay

## 2016-06-22 VITALS — BP 104/60 | HR 60 | Ht 59.75 in | Wt 191.1 lb

## 2016-06-22 DIAGNOSIS — Z7902 Long term (current) use of antithrombotics/antiplatelets: Secondary | ICD-10-CM

## 2016-06-22 DIAGNOSIS — I2510 Atherosclerotic heart disease of native coronary artery without angina pectoris: Secondary | ICD-10-CM

## 2016-06-22 DIAGNOSIS — Z1211 Encounter for screening for malignant neoplasm of colon: Secondary | ICD-10-CM | POA: Diagnosis not present

## 2016-06-22 DIAGNOSIS — Z9861 Coronary angioplasty status: Secondary | ICD-10-CM

## 2016-06-22 DIAGNOSIS — K620 Anal polyp: Secondary | ICD-10-CM | POA: Diagnosis not present

## 2016-06-22 NOTE — Telephone Encounter (Signed)
Ok to hold plavix 5 days for procedure. Resume when safe from post-procedural bleeding perspective.

## 2016-06-22 NOTE — Telephone Encounter (Signed)
[facility] [address] [place] [address]  DOB: 1946/08/16 MRN: [id number]   Dear Sherren Mocha M.D.,    We have scheduled the above patient for an endoscopic procedure. Our records show that she is on anticoagulation therapy.   Please advise as to how long the patient may come off her therapy of plavix prior to the colonoscopy procedure, which is scheduled for 07/22/16.  Please fax back/ or route the completed form to Demiana Crumbley [place], [place] at [phone].   Sincerely,     Silvano Rusk, M.D.

## 2016-06-22 NOTE — Progress Notes (Signed)
  Referred by [name], MD [place], [place] [id number]   Assessment & Plan:   Encounter Diagnoses  Name Primary?  Marland Kitchen Anal polyp Yes  . Colon cancer screening   . Long term current use of antithrombotics/antiplatelets - clopidogrel   . CAD S/P percutaneous coronary intervention and drug-eluting stent     Plans:  See Dr. Leighton Ruff about painful anal polyp  Last screening colonoscopy was 2002 not 2012 as in chart so overdue  Recommended colonoscopy vs cologuard - have decided on colonoscopy  Hold clopidogrel  5 days before procedure - will instruct when and how to resume after procedure. Risks and benefits of procedure including bleeding, perforation, infection, missed lesions, medication reactions and possible hospitalization or surgery if complications occur explained. Additional rare but real risk of cardiovascular event such as heart attack or ischemia/infarct of other organs off clopidogrel explained and need to seek urgent help if this occurs. Will communicate by phone or EMR with patient's prescribing provider that to confirm holding clopidogrel is reasonable in this case.     Subjective:    Patient ID: [name], female    DOB: Jun 26, 1946, 70 y.o.   MRN: [id number] Cc: hemorrhoids - sore to sit down HPI Here w/ weeks-months of sxs Painful anal protrusion No treatment tried No bowel changes No bleeding Some loose stools Onchronic iron for anemia ferritin is high  Last colonoscopy 2002 Medications, allergies, past medical history, past surgical history, family history and social history are reviewed and updated in the EMR.  Review of Systems Arthritis pains in joints and backl pain, muscle pain, urinary leakage All other ROS negative or as per HPI    Objective:   Physical Exam BP 104/60 (BP Location: Left Arm, Patient Position: Sitting, Cuff Size: Normal)   Pulse 60   Ht 4' 11.75" (1.518 m) Comment: height measured without shoes  Wt  191 lb 2 oz (86.7 kg)   BMI 37.64 kg/m  Chronically ill NAD Eyes anicteric Lungs cta Cor S1S2 no rmg abd mildly obese Alert and oriented  And appropriate mood/affect  Jackolyn Confer CMA present: Rectal - pale mobile polypoid/tag anterior anal area - firm and tender no mass, no fissure detected, NL resting tone   Anoscopy was performed with the patient in the left lateral decubitus position while a chaperone was present and revealed no sig hemorrhoids or fissure     I appreciate the opportunity to care for this patient.[id number] [name], MD

## 2016-06-22 NOTE — Patient Instructions (Addendum)
  You have been scheduled for a colonoscopy. Please follow written instructions given to you at your visit today.  Please pick up your prep supplies at the pharmacy within the next 1-3 days. If you use inhalers (even only as needed), please bring them with you on the day of your procedure.  You will be contacted by our office prior to your procedure for directions on holding your Plavix.  If you do not hear from our office 1 week prior to your scheduled procedure, please call [phone] to discuss.    You have been scheduled for an appointment with Dr Garnetta Buddy at [facility]. Your appointment is on __9/6/17_ at _3:00pm_. Please arrive at __2:30pm__ for registration. Make certain to bring a list of current medications, including any over the counter medications or vitamins. Also bring your co-pay if you have one as well as your insurance cards. [facility] is located at [address]. Should you need to reschedule your appointment, please contact them at [phone].  *Called patient with the appt information above*, they are going to mail her info also.  I appreciate the opportunity to care for you. Silvano Rusk, MD, [facility]

## 2016-06-23 NOTE — Telephone Encounter (Signed)
Patient informed to hold plavix 5 days prior to procedure and she verbalized understanding.

## 2016-06-25 ENCOUNTER — Encounter: Payer: Self-pay | Admitting: Internal Medicine

## 2016-06-28 ENCOUNTER — Other Ambulatory Visit: Payer: Self-pay | Admitting: Internal Medicine

## 2016-06-29 DIAGNOSIS — K620 Anal polyp: Secondary | ICD-10-CM | POA: Diagnosis not present

## 2016-07-08 ENCOUNTER — Encounter: Payer: Self-pay | Admitting: Internal Medicine

## 2016-07-11 ENCOUNTER — Other Ambulatory Visit: Payer: Self-pay | Admitting: Internal Medicine

## 2016-07-11 DIAGNOSIS — M1A09X0 Idiopathic chronic gout, multiple sites, without tophus (tophi): Secondary | ICD-10-CM | POA: Diagnosis not present

## 2016-07-11 DIAGNOSIS — N183 Chronic kidney disease, stage 3 (moderate): Secondary | ICD-10-CM | POA: Diagnosis not present

## 2016-07-11 DIAGNOSIS — Z79899 Other long term (current) drug therapy: Secondary | ICD-10-CM | POA: Diagnosis not present

## 2016-07-12 ENCOUNTER — Other Ambulatory Visit: Payer: Self-pay | Admitting: Physician Assistant

## 2016-07-12 DIAGNOSIS — R079 Chest pain, unspecified: Secondary | ICD-10-CM

## 2016-07-12 DIAGNOSIS — I2510 Atherosclerotic heart disease of native coronary artery without angina pectoris: Secondary | ICD-10-CM

## 2016-07-15 ENCOUNTER — Encounter (HOSPITAL_COMMUNITY)
Admission: RE | Admit: 2016-07-15 | Discharge: 2016-07-15 | Disposition: A | Discharge status: Home / Self Care | Payer: Medicare Other | Source: Ambulatory Visit | Attending: Nephrology | Admitting: Nephrology

## 2016-07-15 DIAGNOSIS — N184 Chronic kidney disease, stage 4 (severe): Secondary | ICD-10-CM | POA: Insufficient documentation

## 2016-07-15 DIAGNOSIS — D631 Anemia in chronic kidney disease: Secondary | ICD-10-CM | POA: Diagnosis not present

## 2016-07-15 DIAGNOSIS — D638 Anemia in other chronic diseases classified elsewhere: Secondary | ICD-10-CM

## 2016-07-15 LAB — POCT HEMOGLOBIN-HEMACUE: HEMOGLOBIN: 10.3 g/dL — AB (ref 12.0–15.0)

## 2016-07-15 LAB — IRON AND TIBC
IRON: 55 ug/dL (ref 28–170)
SATURATION RATIOS: 20 % (ref 10.4–31.8)
TIBC: 281 ug/dL (ref 250–450)
UIBC: 226 ug/dL

## 2016-07-15 LAB — FERRITIN: FERRITIN: 631 ng/mL — AB (ref 11–307)

## 2016-07-15 MED ORDER — EPOETIN ALFA 20000 UNIT/ML IJ SOLN
INTRAMUSCULAR | Status: AC
  Administered 2016-07-15: 20000 [IU]
  Filled 2016-07-15: qty 1

## 2016-07-15 MED ORDER — EPOETIN ALFA 10000 UNIT/ML IJ SOLN: 20000.0000 [IU] | INTRAMUSCULAR | Status: DC

## 2016-07-22 ENCOUNTER — Ambulatory Visit (AMBULATORY_SURGERY_CENTER): Payer: Medicare Other | Admitting: Internal Medicine

## 2016-07-22 ENCOUNTER — Encounter: Payer: Self-pay | Admitting: Internal Medicine

## 2016-07-22 VITALS — BP 130/52 | HR 60 | Temp 96.8°F | Resp 17 | Ht 59.75 in | Wt 191.0 lb

## 2016-07-22 DIAGNOSIS — Z1211 Encounter for screening for malignant neoplasm of colon: Secondary | ICD-10-CM | POA: Diagnosis present

## 2016-07-22 DIAGNOSIS — D125 Benign neoplasm of sigmoid colon: Secondary | ICD-10-CM

## 2016-07-22 DIAGNOSIS — D124 Benign neoplasm of descending colon: Secondary | ICD-10-CM | POA: Diagnosis not present

## 2016-07-22 LAB — GLUCOSE, CAPILLARY
GLUCOSE-CAPILLARY: 59 mg/dL — AB (ref 65–99)
GLUCOSE-CAPILLARY: 154 mg/dL — AB (ref 65–99)
Glucose-Capillary: 156 mg/dL — ABNORMAL HIGH (ref 65–99)

## 2016-07-22 MED ORDER — SODIUM CHLORIDE 0.9 % IV SOLN: 500.0000 mL | INTRAVENOUS | Status: DC

## 2016-07-22 NOTE — Progress Notes (Signed)
A/ox3 pleased with MAC, report to Suzanne RN 

## 2016-07-22 NOTE — Progress Notes (Signed)
No egg or soy allergy known to patient  No issues with past sedation with any surgeries  or procedures, no intubation problems  No diet pills per patient No home 02 use per patient   blood thinners per patient - pt takes plavix- her last dose was Saturday 9-23 per pt. Pt denies issues with constipation  No A fib or A flutter   Pt has been on clear liquids all week - last solid foods was Saturday per pt- she states she was instructed to only have clears after Saturday

## 2016-07-22 NOTE — Op Note (Signed)
[place] Patient Name: [name] Procedure Date: 07/22/2016 10:31 AM MRN: [id number] Endoscopist: Gatha Mayer , MD Age: 70 Referring MD:  Date of Birth: 12-04-1945 Gender: Female Account #: [id number] Procedure:                Colonoscopy Indications:              Screening for colorectal malignant neoplasm Medicines:                Propofol per Anesthesia, Monitored Anesthesia Care Procedure:                Pre-Anesthesia Assessment:                           - Prior to the procedure, a History and Physical                            was performed, and patient medications and                            allergies were reviewed. The patient's tolerance of                            previous anesthesia was also reviewed. The risks                            and benefits of the procedure and the sedation                            options and risks were discussed with the patient.                            All questions were answered, and informed consent                            was obtained. Prior Anticoagulants: The patient                            last took aspirin 1 day and Plavix (clopidogrel) 5                            days prior to the procedure. ASA Grade Assessment:                            III - A patient with severe systemic disease. After                            reviewing the risks and benefits, the patient was                            deemed in satisfactory condition to undergo the                            procedure.  After obtaining informed consent, the colonoscope                            was passed under direct vision. Throughout the                            procedure, the patient's blood pressure, pulse, and                            oxygen saturations were monitored continuously. The                            EC-[id number] ([id number]) was introduced through the anus                            and advanced  to the the cecum, identified by                            appendiceal orifice and ileocecal valve. The                            colonoscopy was somewhat difficult due to                            restricted mobility of the colon. Successful                            completion of the procedure was aided by                            straightening and shortening the scope to obtain                            bowel loop reduction. The patient tolerated the                            procedure well. The quality of the bowel                            preparation was good. The bowel preparation used                            was Miralax. The ileocecal valve, appendiceal                            orifice, and rectum were photographed. Scope In: 10:40:10 AM Scope Out: 10:54:37 AM Scope Withdrawal Time: 0 hours 9 minutes 24 seconds  Total Procedure Duration: 0 hours 14 minutes 27 seconds  Findings:                 The perianal exam findings include anal polyp.                           The digital rectal exam was normal.  Two sessile polyps were found in the sigmoid colon                            and descending colon. The polyps were 5 to 8 mm in                            size. These polyps were removed with a cold snare.                            Resection and retrieval were complete. Verification                            of patient identification for the specimen was                            done. Estimated blood loss was minimal.                           A 2 mm polyp was found in the descending colon. The                            polyp was sessile. The polyp was removed with a                            cold biopsy forceps. Resection and retrieval were                            complete. Verification of patient identification                            for the specimen was done. Estimated blood loss was                            minimal.                            Diverticula were found in the sigmoid colon. There                            was narrowing of the colon in association with the                            diverticular opening.                           The exam was otherwise without abnormality on                            direct and retroflexion views. Complications:            No immediate complications. Estimated Blood Loss:     Estimated blood loss was minimal. Impression:               - Anal polyp found on perianal exam.                           -  Two 5 to 8 mm polyps in the sigmoid colon and in                            the descending colon, removed with a cold snare.                            Resected and retrieved.                           - One 2 mm polyp in the descending colon, removed                            with a cold biopsy forceps. Resected and retrieved.                           - Severe diverticulosis in the sigmoid colon. There                            was narrowing of the colon in association with the                            diverticular opening.                           - The examination was otherwise normal on direct                            and retroflexion views. Recommendation:           - Patient has a contact number available for                            emergencies. The signs and symptoms of potential                            delayed complications were discussed with the                            patient. Return to normal activities tomorrow.                            Written discharge instructions were provided to the                            patient.                           - Resume previous diet.                           - Continue present medications.                           - Resume aspirin today and Plavix (clopidogrel)  tomorrow at prior doses.                           - Repeat colonoscopy is recommended. The                             colonoscopy date will be determined after pathology                            results from today's exam become available for                            review. Gatha Mayer, MD 07/22/2016 11:02:29 AM This report has been signed electronically.

## 2016-07-22 NOTE — Patient Instructions (Addendum)
I found and removed 3 polyps - all look benign  You also have a condition called diverticulosis - common and not usually a problem. Please read the handout provided.  I will let you know pathology results and when to have another routine colonoscopy by mail.  Restart Plavix (clopidogrel) tomorrow.  I appreciate the opportunity to care for you. Gatha Mayer, MD, FACG   YOU HAD AN ENDOSCOPIC PROCEDURE TODAY AT [facility]:   Refer to the procedure report that was given to you for any specific questions about what was found during the examination.  If the procedure report does not answer your questions, please call your gastroenterologist to clarify.  If you requested that your care partner not be given the details of your procedure findings, then the procedure report has been included in a sealed envelope for you to review at your convenience later.  YOU SHOULD EXPECT: Some feelings of bloating in the abdomen. Passage of more gas than usual.  Walking can help get rid of the air that was put into your GI tract during the procedure and reduce the bloating. If you had a lower endoscopy (such as a colonoscopy or flexible sigmoidoscopy) you may notice spotting of blood in your stool or on the toilet paper. If you underwent a bowel prep for your procedure, you may not have a normal bowel movement for a few days.  Please Note:  You might notice some irritation and congestion in your nose or some drainage.  This is from the oxygen used during your procedure.  There is no need for concern and it should clear up in a day or so.  SYMPTOMS TO REPORT IMMEDIATELY:   Following lower endoscopy (colonoscopy or flexible sigmoidoscopy):  Excessive amounts of blood in the stool  Significant tenderness or worsening of abdominal pains  Swelling of the abdomen that is new, acute  Fever of 100F or higher   For urgent or emergent issues, a gastroenterologist can be reached at any hour  by calling [phone].   DIET:  We do recommend a small meal at first, but then you may proceed to your regular diet.  Drink plenty of fluids but you should avoid alcoholic beverages for 24 hours.  ACTIVITY:  You should plan to take it easy for the rest of today and you should NOT DRIVE or use heavy machinery until tomorrow (because of the sedation medicines used during the test).    FOLLOW UP: Our staff will call the number listed on your records the next business day following your procedure to check on you and address any questions or concerns that you may have regarding the information given to you following your procedure. If we do not reach you, we will leave a message.  However, if you are feeling well and you are not experiencing any problems, there is no need to return our call.  We will assume that you have returned to your regular daily activities without incident.  If any biopsies were taken you will be contacted by phone or by letter within the next 1-3 weeks.  Please call us at [phone] if you have not heard about the biopsies in 3 weeks.    SIGNATURES/CONFIDENTIALITY: You and/or your care partner have signed paperwork which will be entered into your electronic medical record.  These signatures attest to the fact that that the information above on your After Visit Summary has been reviewed and is understood.  Full responsibility  of the confidentiality of this discharge information lies with you and/or your care-partner.  Restart your plavix tomorrow.  Read all of the handouts given to you by your recovery room nurse.  Thank-you for choosing [place] for your medical needs today.

## 2016-07-22 NOTE — Progress Notes (Signed)
Called to room to assist during endoscopic procedure.  Patient ID and intended procedure confirmed with present staff. Received instructions for my participation in the procedure from the performing physician.  

## 2016-07-25 ENCOUNTER — Telehealth: Payer: Self-pay

## 2016-07-25 NOTE — Telephone Encounter (Signed)
  Follow up Call-  Call back number 07/22/2016  Post procedure Call Back phone  # [phone]  Permission to leave phone message Yes  Some recent data might be hidden     Patient questions:  Do you have a fever, pain , or abdominal swelling? No. Pain Score  0 *  Have you tolerated food without any problems? Yes.    Have you been able to return to your normal activities? Yes.    Do you have any questions about your discharge instructions: Diet   No. Medications  No. Follow up visit  No.  Do you have questions or concerns about your Care? No.  Actions: * If pain score is 4 or above: No action needed, pain <4.  No problems per the pt. maw

## 2016-08-19 ENCOUNTER — Inpatient Hospital Stay (HOSPITAL_COMMUNITY): Admission: RE | Admit: 2016-08-19 | Payer: Medicare Other | Source: Ambulatory Visit

## 2016-08-23 ENCOUNTER — Encounter (HOSPITAL_COMMUNITY)
Admission: RE | Admit: 2016-08-23 | Discharge: 2016-08-23 | Disposition: A | Discharge status: Home / Self Care | Payer: Medicare Other | Source: Ambulatory Visit | Attending: Nephrology | Admitting: Nephrology

## 2016-08-23 DIAGNOSIS — D631 Anemia in chronic kidney disease: Secondary | ICD-10-CM | POA: Insufficient documentation

## 2016-08-23 DIAGNOSIS — N184 Chronic kidney disease, stage 4 (severe): Secondary | ICD-10-CM | POA: Diagnosis not present

## 2016-08-23 DIAGNOSIS — D638 Anemia in other chronic diseases classified elsewhere: Secondary | ICD-10-CM

## 2016-08-23 LAB — POCT HEMOGLOBIN-HEMACUE: HEMOGLOBIN: 10.5 g/dL — AB (ref 12.0–15.0)

## 2016-08-23 MED ORDER — EPOETIN ALFA 20000 UNIT/ML IJ SOLN
INTRAMUSCULAR | Status: AC
  Administered 2016-08-23: 20000 [IU] via SUBCUTANEOUS
  Filled 2016-08-23: qty 1

## 2016-08-23 MED ORDER — EPOETIN ALFA 10000 UNIT/ML IJ SOLN: 20000.0000 [IU] | INTRAMUSCULAR | Status: DC

## 2016-08-27 ENCOUNTER — Other Ambulatory Visit: Payer: Self-pay | Admitting: Internal Medicine

## 2016-09-05 DIAGNOSIS — Z23 Encounter for immunization: Secondary | ICD-10-CM | POA: Diagnosis not present

## 2016-09-05 DIAGNOSIS — D631 Anemia in chronic kidney disease: Secondary | ICD-10-CM | POA: Diagnosis not present

## 2016-09-05 DIAGNOSIS — E1129 Type 2 diabetes mellitus with other diabetic kidney complication: Secondary | ICD-10-CM | POA: Diagnosis not present

## 2016-09-05 DIAGNOSIS — I129 Hypertensive chronic kidney disease with stage 1 through stage 4 chronic kidney disease, or unspecified chronic kidney disease: Secondary | ICD-10-CM | POA: Diagnosis not present

## 2016-09-05 DIAGNOSIS — N184 Chronic kidney disease, stage 4 (severe): Secondary | ICD-10-CM | POA: Diagnosis not present

## 2016-09-05 DIAGNOSIS — N2581 Secondary hyperparathyroidism of renal origin: Secondary | ICD-10-CM | POA: Diagnosis not present

## 2016-09-25 ENCOUNTER — Other Ambulatory Visit: Payer: Self-pay | Admitting: Internal Medicine

## 2016-09-27 ENCOUNTER — Encounter (HOSPITAL_COMMUNITY): Payer: Medicare Other

## 2016-10-04 ENCOUNTER — Encounter (HOSPITAL_COMMUNITY)
Admission: RE | Admit: 2016-10-04 | Discharge: 2016-10-04 | Disposition: A | Discharge status: Home / Self Care | Payer: Medicare Other | Source: Ambulatory Visit | Attending: Nephrology | Admitting: Nephrology

## 2016-10-04 DIAGNOSIS — N184 Chronic kidney disease, stage 4 (severe): Secondary | ICD-10-CM | POA: Insufficient documentation

## 2016-10-04 DIAGNOSIS — D631 Anemia in chronic kidney disease: Secondary | ICD-10-CM | POA: Diagnosis not present

## 2016-10-04 DIAGNOSIS — D638 Anemia in other chronic diseases classified elsewhere: Secondary | ICD-10-CM

## 2016-10-04 LAB — FERRITIN: Ferritin: 621 ng/mL — ABNORMAL HIGH (ref 11–307)

## 2016-10-04 LAB — IRON AND TIBC
IRON: 82 ug/dL (ref 28–170)
SATURATION RATIOS: 26 % (ref 10.4–31.8)
TIBC: 314 ug/dL (ref 250–450)
UIBC: 232 ug/dL

## 2016-10-04 LAB — POCT HEMOGLOBIN-HEMACUE: Hemoglobin: 11.2 g/dL — ABNORMAL LOW (ref 12.0–15.0)

## 2016-10-04 MED ORDER — EPOETIN ALFA 10000 UNIT/ML IJ SOLN: 20000.0000 [IU] | INTRAMUSCULAR | Status: DC

## 2016-10-04 MED ORDER — EPOETIN ALFA 20000 UNIT/ML IJ SOLN
INTRAMUSCULAR | Status: AC
  Administered 2016-10-04: 20000 [IU] via SUBCUTANEOUS
  Filled 2016-10-04: qty 1

## 2016-10-05 ENCOUNTER — Other Ambulatory Visit: Payer: Self-pay | Admitting: Internal Medicine

## 2016-10-06 ENCOUNTER — Other Ambulatory Visit (INDEPENDENT_AMBULATORY_CARE_PROVIDER_SITE_OTHER): Payer: Medicare Other

## 2016-10-06 ENCOUNTER — Ambulatory Visit (INDEPENDENT_AMBULATORY_CARE_PROVIDER_SITE_OTHER): Payer: Medicare Other | Admitting: Internal Medicine

## 2016-10-06 ENCOUNTER — Encounter: Payer: Self-pay | Admitting: Internal Medicine

## 2016-10-06 VITALS — BP 138/78 | HR 77 | Temp 97.6°F | Resp 20 | Wt 192.0 lb

## 2016-10-06 DIAGNOSIS — E119 Type 2 diabetes mellitus without complications: Secondary | ICD-10-CM

## 2016-10-06 DIAGNOSIS — I10 Essential (primary) hypertension: Secondary | ICD-10-CM | POA: Diagnosis not present

## 2016-10-06 DIAGNOSIS — Z0001 Encounter for general adult medical examination with abnormal findings: Secondary | ICD-10-CM | POA: Diagnosis not present

## 2016-10-06 DIAGNOSIS — E785 Hyperlipidemia, unspecified: Secondary | ICD-10-CM | POA: Diagnosis not present

## 2016-10-06 DIAGNOSIS — N186 End stage renal disease: Secondary | ICD-10-CM

## 2016-10-06 LAB — LIPID PANEL
CHOL/HDL RATIO: 9
Cholesterol: 301 mg/dL — ABNORMAL HIGH (ref 0–200)
HDL: 35.2 mg/dL — AB (ref >39.00)
NONHDL: 265.51
TRIGLYCERIDES: 318 mg/dL — AB (ref 0.0–149.0)
VLDL: 63.6 mg/dL — ABNORMAL HIGH (ref 0.0–40.0)

## 2016-10-06 LAB — HEPATIC FUNCTION PANEL
ALT: 15 U/L (ref 0–35)
AST: 10 U/L (ref 0–37)
Albumin: 3.8 g/dL (ref 3.5–5.2)
Alkaline Phosphatase: 60 U/L (ref 39–117)
BILIRUBIN DIRECT: 0.1 mg/dL (ref 0.0–0.3)
BILIRUBIN TOTAL: 0.3 mg/dL (ref 0.2–1.2)
TOTAL PROTEIN: 6.9 g/dL (ref 6.0–8.3)

## 2016-10-06 LAB — BASIC METABOLIC PANEL
BUN: 45 mg/dL — ABNORMAL HIGH (ref 6–23)
CALCIUM: 8.5 mg/dL (ref 8.4–10.5)
CO2: 27 mEq/L (ref 19–32)
CREATININE: 2.62 mg/dL — AB (ref 0.40–1.20)
Chloride: 103 mEq/L (ref 96–112)
GFR: 23.04 mL/min — AB (ref >60.00)
Glucose, Bld: 90 mg/dL (ref 70–99)
Potassium: 3.6 mEq/L (ref 3.5–5.1)
SODIUM: 140 meq/L (ref 135–145)

## 2016-10-06 LAB — LDL CHOLESTEROL, DIRECT: Direct LDL: 175 mg/dL

## 2016-10-06 LAB — HEMOGLOBIN A1C: Hgb A1c MFr Bld: 5.6 % (ref 4.6–6.5)

## 2016-10-06 MED ORDER — GLUCOSE BLOOD VI STRP: ORAL_STRIP | 12 refills | Status: DC

## 2016-10-06 NOTE — Progress Notes (Signed)
Subjective:    Patient ID: [name], female    DOB: 09-22-1946, 70 y.o.   MRN: [id number]  HPI Here to f/u; overall doing ok,  Pt denies chest pain, increasing sob or doe, wheezing, orthopnea, PND, increased LE swelling, palpitations, dizziness or syncope.  Pt denies new neurological symptoms such as new headache, or facial or extremity weakness or numbness.  Pt denies polydipsia, polyuria, or low sugar episode.   Pt denies new neurological symptoms such as new headache, or facial or extremity weakness or numbness.   Pt states overall good compliance with meds, mostly trying to follow appropriate diet, with wt overall stable,  but little exercise however  Had flu shot recently  [facility] nephrology with stable renal fxn recently Past Medical History:  Diagnosis Date  . Anemia, iron deficiency   . Arthritis   . CAD (coronary artery disease)   . Carotid stenosis    Carotid US (02/2014):  Bilateral ICA 40-59%; > 50% L ECA; F/u 1 year  . Cataract    removed both eyes  . Chronic kidney disease    chronic renal failure  . DM2 (diabetes mellitus, type 2) ([place])   . GERD (gastroesophageal reflux disease)   . Glaucoma   . HLD (hyperlipidemia)   . HTN (hypertension)   . Hx of cardiovascular stress test    Lexiscan Myoview (03/24/14):  No ischemia, EF 73%, Normal  . Mitral regurgitation   . Osteoporosis   . PUD (peptic ulcer disease)   . PVD (peripheral vascular disease) ([place])   . Shortness of breath    with exertion   Past Surgical History:  Procedure Laterality Date  . ABDOMINAL HYSTERECTOMY    . APPENDECTOMY    . AV FISTULA PLACEMENT Right 01/18/2013   Procedure: ARTERIOVENOUS (AV) FISTULA CREATION;  Surgeon: Rosetta Posner, MD;  Location[place] [facility]  Service: Vascular;  Laterality: Right;  Ultrasound guided  . COLONOSCOPY    . CORONARY ANGIOPLASTY WITH STENT PLACEMENT    . RENAL ANGIOGRAM N/A 09/16/2011   Procedure: RENAL ANGIOGRAM;  Surgeon: Sherren Mocha, MD;  Location[facility] C[facility] LAB;   Service: Cardiovascular;  Laterality: N/A;    reports that she quit smoking about 8 years ago. She has never used smokeless tobacco. She reports that she does not drink alcohol or use drugs. family history includes Diabetes in her sister and son; Glaucoma in her father; Heart disease in her father. No Known Allergies Current Outpatient Prescriptions on File Prior to Visit  Medication Sig Dispense Refill  . acetaminophen (TYLENOL) 500 MG tablet Take 500-1,000 mg by mouth as needed.    Marland Kitchen amLODipine (NORVASC) 10 MG tablet TAKE ONE (1) TABLET EACH DAY 90 tablet 2  . aspirin 81 MG tablet Take 81 mg by mouth daily.    Marland Kitchen atorvastatin (LIPITOR) 80 MG tablet Take 1 tablet (80 mg total) by mouth daily at 6 PM. 15 tablet 0  . BD PEN NEEDLE NANO U/F 32G X 4 MM MISC USE DAILY WITH LANTUS SOLASTAR 100 each 11  . BD PEN NEEDLE NANO U/F 32G X 4 MM MISC USE DAILY WITH LANTUS SOLASTAR 100 each 1  . blood glucose meter kit and supplies KIT Dispense based on patient and insurance preference. Use up to three times daily as directed. (FOR ICD-9 250.00, 250.01). 1 each 0  . calcitRIOL (ROCALTROL) 0.5 MCG capsule Take 0.5 mcg by mouth daily.  6  . clopidogrel (PLAVIX) 75 MG tablet Take 75 mg by  mouth daily.    . ferrous sulfate 325 (65 FE) MG tablet TAKE 1 TABLET (325 MG TOTAL) BY MOUTH 2 (TWO) TIMES DAILY. 60 tablet 2  . furosemide (LASIX) 80 MG tablet Take 1 tablet (80 mg total) by mouth 2 (two) times daily. 180 tablet 3  . JANUVIA 100 MG tablet TAKE 1 TABLET BY MOUTH EVERY DAY 90 tablet 3  . Lancets MISC Use as directed three times daily with meals 300 each 3  . LANTUS SOLOSTAR 100 UNIT/ML Solostar Pen INJECT 10 UNITS INTO THE SKIN DAILY 2 pen 3  . metoprolol (LOPRESSOR) 100 MG tablet Take 1 tablet (100 mg total) by mouth 2 (two) times daily. 180 tablet 2  . nitroGLYCERIN (NITROSTAT) 0.4 MG SL tablet Place 0.4 mg under the tongue every 5 (five) minutes as needed for chest pain. Reported on 04/06/2016    . NITROSTAT  0.4 MG SL tablet PLACE 1 TABLET UNDER THE TONGUE EVERY 5 (FIVE) MINUTES AS NEEDED. 25 tablet 1  . pioglitazone (ACTOS) 45 MG tablet TAKE 1 TABLET BY MOUTH EVERY DAY 90 tablet 1  . risedronate (ACTONEL) 150 MG tablet TAKE 1 TABLET BY MOUTH ONCE A MONTH-TAKE ON THE SAME DAY OF EACH MONTH. 1 tablet 5  . timolol (BETIMOL) 0.5 % ophthalmic solution Place 1 drop into both eyes daily.     . vitamin B-12 (CYANOCOBALAMIN) 1000 MCG tablet Take 1,000 mcg by mouth daily.     Current Facility-Administered Medications on File Prior to Visit  Medication Dose Route Frequency Provider Last Rate Last Dose  . 0.9 %  sodium chloride infusion  500 mL Intravenous Continuous Gatha Mayer, MD        Review of Systems  Constitutional: Negative for unusual diaphoresis or night sweats HENT: Negative for ear swelling or discharge Eyes: Negative for worsening visual haziness  Respiratory: Negative for choking and stridor.   Gastrointestinal: Negative for distension or worsening eructation Genitourinary: Negative for retention or change in urine volume.  Musculoskeletal: Negative for other MSK pain or swelling Skin: Negative for color change and worsening wound Neurological: Negative for tremors and numbness other than noted  Psychiatric/Behavioral: Negative for decreased concentration or agitation other than above   All other system neg per pt    Objective:   Physical Exam BP 138/78   Pulse 77   Temp 97.6 F (36.4 C) (Oral)   Resp 20   Wt 192 lb (87.1 kg)   SpO2 95%   BMI 37.81 kg/m  VS noted,  Constitutional: Pt appears in no apparent distress HENT: Head: NCAT.  Right Ear: External ear normal.  Left Ear: External ear normal.  Eyes: . Pupils are equal, round, and reactive to light. Conjunctivae and EOM are normal Neck: Normal range of motion. Neck supple.  Cardiovascular: Normal rate and regular rhythm.   Pulmonary/Chest: Effort normal and breath sounds without rales or wheezing.  Neurological: Pt  is alert. Not confused , motor grossly intact Skin: Skin is warm. No rash, no LE edema Psychiatric: Pt behavior is normal. No agitation.  No other new exam findings    Assessment & Plan:

## 2016-10-06 NOTE — Patient Instructions (Addendum)
Please continue all other medications as before, and refills have been done if requested.  Please have the pharmacy call with any other refills you may need.  Please continue your efforts at being more active, low cholesterol diet, and weight control  Please keep your appointments with your specialists as you may have planned  Please go to the LAB in the Basement (turn left off the elevator) for the tests to be done today  You will be contacted by phone if any changes need to be made immediately.  Otherwise, you will receive a letter about your results with an explanation, but please check with MyChart first.  Please remember to sign up for MyChart if you have not done so, as this will be important to you in the future with finding out test results, communicating by private email, and scheduling acute appointments online when needed.  If you have Medicare related insurance (such as traditoinal Medicare, [employer] or [employer], or similar), Please make an appointment at the Scheduling desk with Maudie Mercury, the [employer], for your Wellness Visit in this office, which is a benefit with your insurance.  Please return in 6 months, or sooner if needed, with Lab testing done 3-5 days before

## 2016-10-06 NOTE — Progress Notes (Signed)
Pre visit review using our clinic review tool, if applicable. No additional management support is needed unless otherwise documented below in the visit note. 

## 2016-10-07 ENCOUNTER — Other Ambulatory Visit: Payer: Self-pay | Admitting: Internal Medicine

## 2016-10-08 NOTE — Assessment & Plan Note (Signed)
stable overall by history and exam, recent data reviewed with pt, and pt to continue medical treatment as before,  to f/u any worsening symptoms or concerns Lab Results  Component Value Date   LDLCALC 61 03/13/2014

## 2016-10-08 NOTE — Assessment & Plan Note (Signed)
stable overall by history and exam, recent data reviewed with pt, and pt to continue medical treatment as before,  to f/u any worsening symptoms or concerns BP Readings from Last 3 Encounters:  10/06/16 138/78  10/04/16 (!) 148/69  08/23/16 (!) 144/51

## 2016-10-08 NOTE — Assessment & Plan Note (Signed)
stable overall by history and exam, and pt to continue medical treatment as before,  to f/u any worsening symptoms or concerns 

## 2016-10-08 NOTE — Assessment & Plan Note (Signed)
stable overall by history and exam, recent data reviewed with pt, and pt to continue medical treatment as before,  to f/u any worsening symptoms or concerns Lab Results  Component Value Date   HGBA1C 5.6 10/06/2016

## 2016-10-12 ENCOUNTER — Inpatient Hospital Stay (HOSPITAL_COMMUNITY)
Admission: RE | Admit: 2016-10-12 | Discharge: 2016-10-12 | Disposition: A | Discharge status: Home / Self Care | Payer: Medicare Other | Source: Ambulatory Visit | Attending: Nephrology | Admitting: Nephrology

## 2016-11-04 DIAGNOSIS — I129 Hypertensive chronic kidney disease with stage 1 through stage 4 chronic kidney disease, or unspecified chronic kidney disease: Secondary | ICD-10-CM | POA: Diagnosis not present

## 2016-11-04 DIAGNOSIS — N184 Chronic kidney disease, stage 4 (severe): Secondary | ICD-10-CM | POA: Diagnosis not present

## 2016-11-04 DIAGNOSIS — N2581 Secondary hyperparathyroidism of renal origin: Secondary | ICD-10-CM | POA: Diagnosis not present

## 2016-11-04 DIAGNOSIS — E1129 Type 2 diabetes mellitus with other diabetic kidney complication: Secondary | ICD-10-CM | POA: Diagnosis not present

## 2016-11-04 DIAGNOSIS — D631 Anemia in chronic kidney disease: Secondary | ICD-10-CM | POA: Diagnosis not present

## 2016-11-08 ENCOUNTER — Inpatient Hospital Stay (HOSPITAL_COMMUNITY): Admission: RE | Admit: 2016-11-08 | Payer: Medicare Other | Source: Ambulatory Visit

## 2016-11-28 DIAGNOSIS — E876 Hypokalemia: Secondary | ICD-10-CM | POA: Diagnosis not present

## 2016-11-30 ENCOUNTER — Other Ambulatory Visit: Payer: Self-pay | Admitting: *Deleted

## 2016-11-30 MED ORDER — FERROUS SULFATE 325 (65 FE) MG PO TABS: ORAL_TABLET | ORAL | 5 refills | Status: DC

## 2016-12-02 ENCOUNTER — Inpatient Hospital Stay (HOSPITAL_COMMUNITY): Admission: RE | Admit: 2016-12-02 | Payer: Medicare Other | Source: Ambulatory Visit

## 2016-12-09 ENCOUNTER — Encounter (HOSPITAL_COMMUNITY)
Admission: RE | Admit: 2016-12-09 | Discharge: 2016-12-09 | Disposition: A | Discharge status: Home / Self Care | Payer: Medicare Other | Source: Ambulatory Visit | Attending: Nephrology | Admitting: Nephrology

## 2016-12-09 DIAGNOSIS — N184 Chronic kidney disease, stage 4 (severe): Secondary | ICD-10-CM | POA: Insufficient documentation

## 2016-12-09 DIAGNOSIS — D631 Anemia in chronic kidney disease: Secondary | ICD-10-CM | POA: Insufficient documentation

## 2016-12-09 DIAGNOSIS — D638 Anemia in other chronic diseases classified elsewhere: Secondary | ICD-10-CM

## 2016-12-09 LAB — POCT HEMOGLOBIN-HEMACUE: Hemoglobin: 11.1 g/dL — ABNORMAL LOW (ref 12.0–15.0)

## 2016-12-09 MED ORDER — EPOETIN ALFA 10000 UNIT/ML IJ SOLN: 20000.0000 [IU] | INTRAMUSCULAR | Status: DC

## 2016-12-09 MED ORDER — EPOETIN ALFA 20000 UNIT/ML IJ SOLN
INTRAMUSCULAR | Status: AC
  Administered 2016-12-09: 13:00:00 20000 [IU] via SUBCUTANEOUS
  Filled 2016-12-09: qty 1

## 2016-12-11 ENCOUNTER — Other Ambulatory Visit: Payer: Self-pay | Admitting: Internal Medicine

## 2016-12-14 ENCOUNTER — Other Ambulatory Visit: Payer: Self-pay | Admitting: Internal Medicine

## 2016-12-19 NOTE — Progress Notes (Signed)
Subjective:   [name] is a 71 y.o. female who presents for Medicare Annual (Subsequent) preventive examination.  Review of Systems:  No ROS.  Medicare Wellness Visit.  Cardiac Risk Factors include: hypertension;advanced age (>70mn, >>8women);diabetes mellitus;dyslipidemia;family history of premature cardiovascular disease;obesity (BMI >30kg/m2);sedentary lifestyle Sleep patterns: has interrupted sleep, is not rested upon awakening, has daytime sleepiness, does not get up to void and sleeps 3 hours nightly.    Patient states she is experiencing insomnia. Daughter is asking about sleep medication. Patient reports increase stress due to her roommate having dementia, she provides a lot of care to her and states that at times she can be difficult. Patient does state that she feels safe in current home environment.   Reviewed sleep and stress reduction tips with patient  Home Safety/Smoke Alarms:  Feels safe in home. Smoke alarms in place.  Living environment; residence and Firearm Safety: 2-story house, equipment: [place]. Lives with roommate Seat Belt Safety/Bike Helmet: Wears seat belt.   Counseling:   Eye Exam- Last every 6 months Dental- dentures  Female:   Pap-    N/A   Mammo-  Last 04/04/11,  referral provided today   Dexa scan-  None, referral provided today   CCS- Last 07/22/16, polyps and diverticulosis      Objective:     Vitals: BP (!) 154/82   Pulse 67   Resp 18   Ht 5' (1.524 m)   Wt 188 lb (85.3 kg)   SpO2 99%   BMI 36.72 kg/m   Body mass index is 36.72 kg/m.   Tobacco History  Smoking Status  . Former Smoker  . Quit date: 10/25/2007  Smokeless Tobacco  . Never Used    Comment: Stopped 1.5 yrs ago January 2009     Counseling given: Not Answered   Past Medical History:  Diagnosis Date  . Anemia, iron deficiency   . Arthritis   . CAD (coronary artery disease)   . Carotid stenosis    Carotid [place] (02/2014):  Bilateral ICA 40-59%; > 50% L ECA; F/u 1 year  . Cataract    removed both eyes  . Chronic kidney disease    chronic renal failure  . [place]   . GERD (gastroesophageal reflux disease)   . Glaucoma   . HLD (hyperlipidemia)   . HTN (hypertension)   . Hx of cardiovascular stress test    Lexiscan Myoview (03/24/14):  No ischemia, EF 73%, Normal  . Mitral regurgitation   . Osteoporosis   . PUD (peptic ulcer disease)   . PVD [place]ipheral vascular disease) [place]   . Shortness of breath    with exertion   Past Surgical History:  Procedure Laterality Date  . ABDOMINAL HYSTERECTOMY    . APPENDECTOMY    . AV FISTULA PLACEMENT Right 01/18/2013   Procedure: ARTERIOVENOUS (AV) FISTULA CREATION;  Surgeon: TRosetta Posner MD;  [place]  Service: Vascular;  Laterality: Right;  Ultrasound guided  . COLONOSCOPY    . CORONARY ANGIOPLASTY WITH STENT PLACEMENT    . RENAL ANGIOGRAM N/A 09/16/2011   Procedure: RENAL ANGIOGRAM;  Surgeon: MSherren Mocha MD;  [facility];  Service: Cardiovascular;  Laterality: N/A;   Family History  Problem Relation Age of Onset  . Heart disease Father   . Glaucoma Father   . Diabetes Sister   . Diabetes Son   . Colon cancer Neg Hx   .  Colon polyps Neg Hx   . Esophageal cancer Neg Hx   . Rectal cancer Neg Hx   . Stomach cancer Neg Hx    History  Sexual Activity  . Sexual activity: No    Outpatient Encounter Prescriptions as of 12/20/2016  Medication Sig  . acetaminophen (TYLENOL) 500 MG tablet Take 500-1,000 mg by mouth as needed.  Marland Kitchen amLODipine (NORVASC) 10 MG tablet TAKE ONE (1) TABLET EACH DAY  . aspirin 81 MG tablet Take 81 mg by mouth daily.  Marland Kitchen atorvastatin (LIPITOR) 80 MG tablet Take 1 tablet (80 mg total) by mouth daily at 6 PM.  . BD PEN NEEDLE NANO U/F 32G X 4 MM MISC USE DAILY WITH LANTUS SOLASTAR  . BD PEN NEEDLE NANO U/F 32G X 4 MM MISC USE DAILY WITH LANTUS SOLASTAR  . blood glucose meter  kit and supplies KIT Dispense based on patient and insurance preference. Use up to three times daily as directed. (FOR ICD-9 250.00, 250.01).  . calcitRIOL (ROCALTROL) 0.5 MCG capsule Take 0.5 mcg by mouth daily.  . clopidogrel (PLAVIX) 75 MG tablet Take 75 mg by mouth daily.  . clopidogrel (PLAVIX) 75 MG tablet TAKE ONE (1) TABLET EACH DAY  . ferrous sulfate 325 (65 FE) MG tablet TAKE 1 TABLET (325 MG TOTAL) BY MOUTH 2 (TWO) TIMES DAILY.  . furosemide (LASIX) 80 MG tablet Take 1 tablet (80 mg total) by mouth 2 (two) times daily.  Marland Kitchen glucose blood test strip ACCU CHECK - Use as instructed once daily  . JANUVIA 100 MG tablet TAKE 1 TABLET BY MOUTH EVERY DAY  . Lancets MISC Use as directed three times daily with meals  . LANTUS SOLOSTAR 100 UNIT/ML Solostar Pen INJECT 10 UNITS INTO THE SKIN DAILY  . LANTUS SOLOSTAR 100 UNIT/ML Solostar Pen INJECT 10 UNITS INTO THE SKIN DAILY  . metoprolol (LOPRESSOR) 100 MG tablet Take 1 tablet (100 mg total) by mouth 2 (two) times daily.  . nitroGLYCERIN (NITROSTAT) 0.4 MG SL tablet Place 0.4 mg under the tongue every 5 (five) minutes as needed for chest pain. Reported on 04/06/2016  . NITROSTAT 0.4 MG SL tablet PLACE 1 TABLET UNDER THE TONGUE EVERY 5 (FIVE) MINUTES AS NEEDED.  Marland Kitchen omeprazole (PRILOSEC) 20 MG capsule TAKE 1 CAPSULE (20 MG TOTAL) BY MOUTH DAILY.  Marland Kitchen pioglitazone (ACTOS) 45 MG tablet TAKE 1 TABLET BY MOUTH EVERY DAY  . risedronate (ACTONEL) 150 MG tablet TAKE 1 TABLET BY MOUTH ONCE A MONTH-TAKE ON THE SAME DAY OF EACH MONTH.  . timolol (BETIMOL) 0.5 % ophthalmic solution Place 1 drop into both eyes daily.   . vitamin B-12 (CYANOCOBALAMIN) 1000 MCG tablet Take 1,000 mcg by mouth daily.   Facility-Administered Encounter Medications as of 12/20/2016  Medication  . 0.9 %  sodium chloride infusion    Activities of Daily Living In your present state of health, do you have any difficulty performing the following activities: 12/20/2016 07/15/2016  Hearing? N  N  Vision? N N  Difficulty concentrating or making decisions? N N  Walking or climbing stairs? N N  Dressing or bathing? N N  Doing errands, shopping? Y -  Conservation officer, nature and eating ? N -  Using the Toilet? N -  In the past six months, have you accidently leaked urine? N -  Do you have problems with loss of bowel control? N -  Managing your Medications? N -  Managing your Finances? N -  Housekeeping or managing your Housekeeping? N -  Some recent data might be hidden    Patient Care Team: [name], MD as PCP - General Fleet Contras, MD as Consulting Physician (Nephrology)    Assessment:    Physical assessment deferred to PCP.  Exercise Activities and Dietary recommendations Current Exercise Habits: The patient does not participate in regular exercise at present (discussed chair exercises), Exercise limited by: orthopedic condition(s) (legally blind)  Diet (meal preparation, eat out, water intake, caffeinated beverages, dairy products, fruits and vegetables): diabetic, low fat/ cholesterol, low salt    Drink 3 16 ounce bottles of water per day. Drinks 1-3 sodas per day Discussed increasing water intake, decreasing soda intake, and portion control.  Goals    . Start to walk with my daughter x 2 weekly      Fall Risk Fall Risk  12/20/2016 03/18/2015 03/13/2014 09/13/2013  Falls in the past year? Yes No No No  Number falls in past yr: 1 - - -  Injury with Fall? No - - -  Risk for fall due to : Impaired balance/gait;Impaired mobility;Impaired vision - - -  Follow up Falls prevention discussed;Education provided - - -   Depression Screen PHQ 2/9 Scores 12/20/2016 03/18/2015 03/13/2014 09/13/2013  PHQ - 2 Score 0 0 0 0     Cognitive Function       Ad8 score reviewed for issues:  Issues making decisions: no  Less interest in hobbies / activities: no  Repeats questions, stories (family complaining): no  Trouble using ordinary gadgets (microwave, computer, phone):  no  Forgets the month or year: no  Mismanaging finances: no  Remembering appts: no  Daily problems with thinking and/or memory: no Ad8 score is= 0    Immunization History  Administered Date(s) Administered  . H1N1 12/18/2008  . Influenza Whole 09/30/2009, 10/07/2010  . Influenza, Seasonal, Injecte, Preservative Fre 08/02/2013  . Influenza-Unspecified 07/25/2015, 08/25/2015  . Pneumococcal Conjugate-13 09/13/2013  . Pneumococcal Polysaccharide-23 06/05/2006, 05/11/2011  . Td 12/18/2008   Screening Tests Health Maintenance  Topic Date Due  . Hepatitis C Screening  12/29/1945  . DEXA SCAN  05/28/2009  . MAMMOGRAM  04/03/2013  . FOOT EXAM  03/17/2016  . URINE MICROALBUMIN  03/17/2016  . OPHTHALMOLOGY EXAM  09/29/2016  . HEMOGLOBIN A1C  04/06/2017  . TETANUS/TDAP  12/18/2018  . COLONOSCOPY  07/22/2026  . INFLUENZA VACCINE  Addressed  . PNA vac Low Risk Adult  Completed      Plan:     Sleep and stress reduction tips discussed with patient. Scheduled a follow-up appointment with PCP, patient would like to discuss sleep medications.  Mammogram and Dexa-scan referral provided today  During the course of the visit the patient was educated and counseled about the following appropriate screening and preventive services:   Vaccines to include Pneumoccal, Influenza, Hepatitis B, Td, Zostavax, HCV  Cardiovascular Disease  Colorectal cancer screening  Bone density screening  Diabetes screening  Glaucoma screening  Mammography/PAP  Nutrition counseling   Patient Instructions (the written plan) was given to the patient.   Michiel Cowboy, RN  12/20/2016

## 2016-12-19 NOTE — Progress Notes (Signed)
Pre visit review using our clinic review tool, if applicable. No additional management support is needed unless otherwise documented below in the visit note. 

## 2016-12-20 ENCOUNTER — Ambulatory Visit (INDEPENDENT_AMBULATORY_CARE_PROVIDER_SITE_OTHER): Payer: Medicare Other | Admitting: *Deleted

## 2016-12-20 VITALS — BP 154/82 | HR 67 | Resp 18 | Ht 60.0 in | Wt 188.0 lb

## 2016-12-20 DIAGNOSIS — Z1239 Encounter for other screening for malignant neoplasm of breast: Secondary | ICD-10-CM

## 2016-12-20 DIAGNOSIS — Z0000 Encounter for general adult medical examination without abnormal findings: Secondary | ICD-10-CM

## 2016-12-20 DIAGNOSIS — Z1231 Encounter for screening mammogram for malignant neoplasm of breast: Secondary | ICD-10-CM | POA: Diagnosis not present

## 2016-12-20 DIAGNOSIS — E2839 Other primary ovarian failure: Secondary | ICD-10-CM

## 2016-12-20 NOTE — Patient Instructions (Addendum)
[name] , Thank you for taking time to come for your Medicare Wellness Visit. I appreciate your ongoing commitment to your health goals. Please review the following plan we discussed and let me know if I can assist you in the future.   These are the goals we discussed: Goals    . Start walk with my daughter x 2 weekly       This is a list of the screening recommended for you and due dates:  Health Maintenance  Topic Date Due  .  Hepatitis C: One time screening is recommended by Center for Disease Control  (CDC) for  adults born from [birth year] through 1965.   71 years old  . DEXA scan (bone density measurement)  05/28/2009  . Mammogram  04/03/2013  . Complete foot exam   03/17/2016  . Urine Protein Check  03/17/2016  . Eye exam for diabetics  09/29/2016  . Hemoglobin A1C  04/06/2017  . Tetanus Vaccine  12/18/2018  . Colon Cancer Screening  07/22/2026  . Flu Shot  Addressed  . Pneumonia vaccines  Completed    Fall Prevention in the Home Falls can cause injuries. They can happen to people of all ages. There are many things you can do to make your home safe and to help prevent falls. What can I do on the outside of my home?  Regularly fix the edges of walkways and driveways and fix any cracks.  Remove anything that might make you trip as you walk through a door, such as a raised step or threshold.  Trim any bushes or trees on the path to your home.  Use bright outdoor lighting.  Clear any walking paths of anything that might make someone trip, such as rocks or tools.  Regularly check to see if handrails are loose or broken. Make sure that both sides of any steps have handrails.  Any raised decks and porches should have guardrails on the edges.  Have any leaves, snow, or ice cleared regularly.  Use sand or salt on walking paths during winter.  Clean up any spills in your garage right away. This includes oil or grease spills. What can I do in the bathroom?  Use night  lights.  Install grab bars by the toilet and in the tub and shower. Do not use towel bars as grab bars.  Use non-skid mats or decals in the tub or shower.  If you need to sit down in the shower, use a plastic, non-slip stool.  Keep the floor dry. Clean up any water that spills on the floor as soon as it happens.  Remove soap buildup in the tub or shower regularly.  Attach bath mats securely with double-sided non-slip rug tape.  Do not have throw rugs and other things on the floor that can make you trip. What can I do in the bedroom?  Use night lights.  Make sure that you have a light by your bed that is easy to reach.  Do not use any sheets or blankets that are too big for your bed. They should not hang down onto the floor.  Have a firm chair that has side arms. You can use this for support while you get dressed.  Do not have throw rugs and other things on the floor that can make you trip. What can I do in the kitchen?  Clean up any spills right away.  Avoid walking on wet floors.  Keep items that you use a lot in  easy-to-reach places.  If you need to reach something above you, use a strong step stool that has a grab bar.  Keep electrical cords out of the way.  Do not use floor polish or wax that makes floors slippery. If you must use wax, use non-skid floor wax.  Do not have throw rugs and other things on the floor that can make you trip. What can I do with my stairs?  Do not leave any items on the stairs.  Make sure that there are handrails on both sides of the stairs and use them. Fix handrails that are broken or loose. Make sure that handrails are as long as the stairways.  Check any carpeting to make sure that it is firmly attached to the stairs. Fix any carpet that is loose or worn.  Avoid having throw rugs at the top or bottom of the stairs. If you do have throw rugs, attach them to the floor with carpet tape.  Make sure that you have a light switch at the  top of the stairs and the bottom of the stairs. If you do not have them, ask someone to add them for you. What else can I do to help prevent falls?  Wear shoes that:  Do not have high heels.  Have rubber bottoms.  Are comfortable and fit you well.  Are closed at the toe. Do not wear sandals.  If you use a stepladder:  Make sure that it is fully opened. Do not climb a closed stepladder.  Make sure that both sides of the stepladder are locked into place.  Ask someone to hold it for you, if possible.  Clearly mark and make sure that you can see:  Any grab bars or handrails.  First and last steps.  Where the edge of each step is.  Use tools that help you move around (mobility aids) if they are needed. These include:  Canes.  Walkers.  Scooters.  Crutches.  Turn on the lights when you go into a dark area. Replace any light bulbs as soon as they burn out.  Set up your furniture so you have a clear path. Avoid moving your furniture around.  If any of your floors are uneven, fix them.  If there are any pets around you, be aware of where they are.  Review your medicines with your doctor. Some medicines can make you feel dizzy. This can increase your chance of falling. Ask your doctor what other things that you can do to help prevent falls. This information is not intended to replace advice given to you by your health care provider. Make sure you discuss any questions you have with your health care provider. Document Released: 08/06/2009 Document Revised: 03/17/2016 Document Reviewed: 11/14/2014 Elsevier Interactive Patient Education  2017 [place] Maintenance, Female Adopting a healthy lifestyle and getting preventive care can go a long way to promote health and wellness. Talk with your health care provider about what schedule of regular examinations is right for you. This is a good chance for you to check in with your provider about disease prevention and  staying healthy. In between checkups, there are plenty of things you can do on your own. Experts have done a lot of research about which lifestyle changes and preventive measures are most likely to keep you healthy. Ask your health care provider for more information. Weight and diet Eat a healthy diet  Be sure to include plenty of vegetables, fruits, low-fat dairy products,  and lean protein.  Do not eat a lot of foods high in solid fats, added sugars, or salt.  Get regular exercise. This is one of the most important things you can do for your health.  Most adults should exercise for at least 150 minutes each week. The exercise should increase your heart rate and make you sweat (moderate-intensity exercise).  Most adults should also do strengthening exercises at least twice a week. This is in addition to the moderate-intensity exercise. Maintain a healthy weight  Body mass index (BMI) is a measurement that can be used to identify possible weight problems. It estimates body fat based on height and weight. Your health care provider can help determine your BMI and help you achieve or maintain a healthy weight.  For females 42 years of age and older:  A BMI below 18.5 is considered underweight.  A BMI of 18.5 to 24.9 is normal.  A BMI of 25 to 29.9 is considered overweight.  A BMI of 30 and above is considered obese. Watch levels of cholesterol and blood lipids  You should start having your blood tested for lipids and cholesterol at 73 years of age, then have this test every 5 years.  You may need to have your cholesterol levels checked more often if:  Your lipid or cholesterol levels are high.  You are older than 73 years of age.  You are at high risk for heart disease. Cancer screening Lung Cancer  Lung cancer screening is recommended for adults 25-70 years old who are at high risk for lung cancer because of a history of smoking.  A yearly low-dose CT scan of the lungs is  recommended for people who:  Currently smoke.  Have quit within the past 15 years.  Have at least a 30-pack-year history of smoking. A pack year is smoking an average of one pack of cigarettes a day for 1 year.  Yearly screening should continue until it has been 15 years since you quit.  Yearly screening should stop if you develop a health problem that would prevent you from having lung cancer treatment. Breast Cancer  Practice breast self-awareness. This means understanding how your breasts normally appear and feel.  It also means doing regular breast self-exams. Let your health care provider know about any changes, no matter how small.  If you are in your 20s or 30s, you should have a clinical breast exam (CBE) by a health care provider every 1-3 years as part of a regular health exam.  If you are 53 or older, have a CBE every year. Also consider having a breast X-ray (mammogram) every year.  If you have a family history of breast cancer, talk to your health care provider about genetic screening.  If you are at high risk for breast cancer, talk to your health care provider about having an MRI and a mammogram every year.  Breast cancer gene (BRCA) assessment is recommended for women who have family members with BRCA-related cancers. BRCA-related cancers include:  Breast.  Ovarian.  Tubal.  Peritoneal cancers.  Results of the assessment will determine the need for genetic counseling and BRCA1 and BRCA2 testing. Cervical Cancer  Your health care provider may recommend that you be screened regularly for cancer of the pelvic organs (ovaries, uterus, and vagina). This screening involves a pelvic examination, including checking for microscopic changes to the surface of your cervix (Pap test). You may be encouraged to have this screening done every 3 years, beginning at age  59.  For women ages 79-65, health care providers may recommend pelvic exams and Pap testing every 3 years, or  they may recommend the Pap and pelvic exam, combined with testing for human papilloma virus (HPV), every 5 years. Some types of HPV increase your risk of cervical cancer. Testing for HPV may also be done on women of any age with unclear Pap test results.  Other health care providers may not recommend any screening for nonpregnant women who are considered low risk for pelvic cancer and who do not have symptoms. Ask your health care provider if a screening pelvic exam is right for you.  If you have had past treatment for cervical cancer or a condition that could lead to cancer, you need Pap tests and screening for cancer for at least 20 years after your treatment. If Pap tests have been discontinued, your risk factors (such as having a new sexual partner) need to be reassessed to determine if screening should resume. Some women have medical problems that increase the chance of getting cervical cancer. In these cases, your health care provider may recommend more frequent screening and Pap tests. Colorectal Cancer  This type of cancer can be detected and often prevented.  Routine colorectal cancer screening usually begins at 73 years of age and continues through 73 years of age.  Your health care provider may recommend screening at an earlier age if you have risk factors for colon cancer.  Your health care provider may also recommend using home test kits to check for hidden blood in the stool.  A small camera at the end of a tube can be used to examine your colon directly (sigmoidoscopy or colonoscopy). This is done to check for the earliest forms of colorectal cancer.  Routine screening usually begins at age 54.  Direct examination of the colon should be repeated every 5-10 years through 73 years of age. However, you may need to be screened more often if early forms of precancerous polyps or small growths are found. Skin Cancer  Check your skin from head to toe regularly.  Tell your health care  provider about any new moles or changes in moles, especially if there is a change in a mole's shape or color.  Also tell your health care provider if you have a mole that is larger than the size of a pencil eraser.  Always use sunscreen. Apply sunscreen liberally and repeatedly throughout the day.  Protect yourself by wearing long sleeves, pants, a wide-brimmed hat, and sunglasses whenever you are outside. Heart disease, diabetes, and high blood pressure  High blood pressure causes heart disease and increases the risk of stroke. High blood pressure is more likely to develop in:  People who have blood pressure in the high end of the normal range (130-139/85-89 mm Hg).  People who are overweight or obese.  People who are African American.  If you are 67-39 years of age, have your blood pressure checked every 3-5 years. If you are 57 years of age or older, have your blood pressure checked every year. You should have your blood pressure measured twice-once when you are at a hospital or clinic, and once when you are not at a hospital or clinic. Record the average of the two measurements. To check your blood pressure when you are not at a hospital or clinic, you can use:  An automated blood pressure machine at a pharmacy.  A home blood pressure monitor.  If you are between 55 years and  59 years old, ask your health care provider if you should take aspirin to prevent strokes.  Have regular diabetes screenings. This involves taking a blood sample to check your fasting blood sugar level.  If you are at a normal weight and have a low risk for diabetes, have this test once every three years after 73 years of age.  If you are overweight and have a high risk for diabetes, consider being tested at a younger age or more often. Preventing infection Hepatitis B  If you have a higher risk for hepatitis B, you should be screened for this virus. You are considered at high risk for hepatitis B if:  You  were born in a country where hepatitis B is common. Ask your health care provider which countries are considered high risk.  Your parents were born in a high-risk country, and you have not been immunized against hepatitis B (hepatitis B vaccine).  You have HIV or AIDS.  You use needles to inject street drugs.  You live with someone who has hepatitis B.  You have had sex with someone who has hepatitis B.  You get hemodialysis treatment.  You take certain medicines for conditions, including cancer, organ transplantation, and autoimmune conditions. Hepatitis C  Blood testing is recommended for:  Everyone born from [birth year] through 1967.  Anyone with known risk factors for hepatitis C. Sexually transmitted infections (STIs)  You should be screened for sexually transmitted infections (STIs) including gonorrhea and chlamydia if:  You are sexually active and are younger than 73 years of age.  You are older than 73 years of age and your health care provider tells you that you are at risk for this type of infection.  Your sexual activity has changed since you were last screened and you are at an increased risk for chlamydia or gonorrhea. Ask your health care provider if you are at risk.  If you do not have HIV, but are at risk, it may be recommended that you take a prescription medicine daily to prevent HIV infection. This is called pre-exposure prophylaxis (PrEP). You are considered at risk if:  You are sexually active and do not regularly use condoms or know the HIV status of your partner(s).  You take drugs by injection.  You are sexually active with a partner who has HIV. Talk with your health care provider about whether you are at high risk of being infected with HIV. If you choose to begin PrEP, you should first be tested for HIV. You should then be tested every 3 months for as long as you are taking PrEP. Pregnancy  If you are premenopausal and you may become pregnant, ask your  health care provider about preconception counseling.  If you may become pregnant, take 400 to 800 micrograms (mcg) of folic acid every day.  If you want to prevent pregnancy, talk to your health care provider about birth control (contraception). Osteoporosis and menopause  Osteoporosis is a disease in which the bones lose minerals and strength with aging. This can result in serious bone fractures. Your risk for osteoporosis can be identified using a bone density scan.  If you are 51 years of age or older, or if you are at risk for osteoporosis and fractures, ask your health care provider if you should be screened.  Ask your health care provider whether you should take a calcium or vitamin D supplement to lower your risk for osteoporosis.  Menopause may have certain physical symptoms and risks.  Hormone replacement therapy may reduce some of these symptoms and risks. Talk to your health care provider about whether hormone replacement therapy is right for you. Follow these instructions at home:  Schedule regular health, dental, and eye exams.  Stay current with your immunizations.  Do not use any tobacco products including cigarettes, chewing tobacco, or electronic cigarettes.  If you are pregnant, do not drink alcohol.  If you are breastfeeding, limit how much and how often you drink alcohol.  Limit alcohol intake to no more than 1 drink per day for nonpregnant women. One drink equals 12 ounces of beer, 5 ounces of Ani Deoliveira, or 1 ounces of hard liquor.  Do not use street drugs.  Do not share needles.  Ask your health care provider for help if you need support or information about quitting drugs.  Tell your health care provider if you often feel depressed.  Tell your health care provider if you have ever been abused or do not feel safe at home. This information is not intended to replace advice given to you by your health care provider. Make sure you discuss any questions you have  with your health care provider. Document Released: 04/25/2011 Document Revised: 03/17/2016 Document Reviewed: 07/14/2015 Elsevier Interactive Patient Education  2017 [employer].

## 2016-12-27 ENCOUNTER — Encounter: Payer: Self-pay | Admitting: Internal Medicine

## 2016-12-27 ENCOUNTER — Other Ambulatory Visit: Payer: Self-pay | Admitting: *Deleted

## 2016-12-27 ENCOUNTER — Ambulatory Visit (INDEPENDENT_AMBULATORY_CARE_PROVIDER_SITE_OTHER): Payer: Medicare Other | Admitting: Internal Medicine

## 2016-12-27 VITALS — BP 136/82 | HR 86 | Temp 97.6°F | Ht 59.0 in | Wt 189.0 lb

## 2016-12-27 DIAGNOSIS — I10 Essential (primary) hypertension: Secondary | ICD-10-CM

## 2016-12-27 DIAGNOSIS — E119 Type 2 diabetes mellitus without complications: Secondary | ICD-10-CM

## 2016-12-27 DIAGNOSIS — G4700 Insomnia, unspecified: Secondary | ICD-10-CM | POA: Diagnosis not present

## 2016-12-27 MED ORDER — FERROUS SULFATE 325 (65 FE) MG PO TABS: ORAL_TABLET | ORAL | 1 refills | Status: DC

## 2016-12-27 MED ORDER — ZOLPIDEM TARTRATE 5 MG PO TABS: 5.0000 mg | ORAL_TABLET | Freq: Every evening | ORAL | 5 refills | Status: DC | PRN

## 2016-12-27 NOTE — Progress Notes (Signed)
Subjective:    Patient ID: [name], female    DOB: [birth year], 71 y.o.   MRN: [id number]  HPI   Here to f/u; overall doing ok,  Pt denies chest pain, increasing sob or doe, wheezing, orthopnea, PND, increased LE swelling, palpitations, dizziness or syncope.  Pt denies new neurological symptoms such as new headache, or facial or extremity weakness or numbness.  Pt denies polydipsia, polyuria, or low sugar episode.   Pt denies new neurological symptoms such as new headache, or facial or extremity weakness or numbness.   Pt states overall good compliance with meds, mostly trying to follow appropriate diet, with wt overall stable,  but little exercise however.  Good compliacne with meds, cbg's in lower 100's.Has much difficulty sleeping, cant get to sleep until daybreak. Tylenol PM not much help.  Denies worsening depressive symptoms, suicidal ideation, or panic Past Medical History:  Diagnosis Date  . Anemia, iron deficiency   . Arthritis   . CAD (coronary artery disease)   . Carotid stenosis    Carotid US (02/2014):  Bilateral ICA 40-59%; > 50% L ECA; F/u 1 year  . Cataract    removed both eyes  . Chronic kidney disease    chronic renal failure  . DM2 (diabetes mellitus, type 2) ([place])   . GERD (gastroesophageal reflux disease)   . Glaucoma   . HLD (hyperlipidemia)   . HTN (hypertension)   . Hx of cardiovascular stress test    Lexiscan Myoview (03/24/14):  No ischemia, EF 73%, Normal  . Mitral regurgitation   . Osteoporosis   . PUD (peptic ulcer disease)   . PVD (peripheral vascular disease) ([place])   . Shortness of breath    with exertion   Past Surgical History:  Procedure Laterality Date  . ABDOMINAL HYSTERECTOMY    . APPENDECTOMY    . AV FISTULA PLACEMENT Right 01/18/2013   Procedure: ARTERIOVENOUS (AV) FISTULA CREATION;  Surgeon: Rosetta Posner, MD;  Location: [place];  Service: Vascular;  Laterality: Right;  Ultrasound guided  . COLONOSCOPY    . CORONARY ANGIOPLASTY WITH  STENT PLACEMENT    . RENAL ANGIOGRAM N/A 09/16/2011   Procedure: RENAL ANGIOGRAM;  Surgeon: Sherren Mocha, MD;  Location[facility] CATH LAB;  Service: Cardiovascular;  Laterality: N/A;    reports that she quit smoking about 9 years ago. She has never used smokeless tobacco. She reports that she does not drink alcohol or use drugs. family history includes Diabetes in her sister and son; Glaucoma in her father; Heart disease in her father. No Known Allergies Current Outpatient Prescriptions on File Prior to Visit  Medication Sig Dispense Refill  . acetaminophen (TYLENOL) 500 MG tablet Take 500-1,000 mg by mouth as needed.    Marland Kitchen amLODipine (NORVASC) 10 MG tablet TAKE ONE (1) TABLET EACH DAY 90 tablet 2  . aspirin 81 MG tablet Take 81 mg by mouth daily.    Marland Kitchen atorvastatin (LIPITOR) 80 MG tablet Take 1 tablet (80 mg total) by mouth daily at 6 PM. 15 tablet 0  . BD PEN NEEDLE NANO U/F 32G X 4 MM MISC USE DAILY WITH LANTUS SOLASTAR 100 each 11  . BD PEN NEEDLE NANO U/F 32G X 4 MM MISC USE DAILY WITH LANTUS SOLASTAR 100 each 1  . blood glucose meter kit and supplies KIT Dispense based on patient and insurance preference. Use up to three times daily as directed. (FOR ICD-9 250.00, 250.01). 1 each 0  . calcitRIOL (ROCALTROL) 0.5  MCG capsule Take 0.5 mcg by mouth daily.  6  . clopidogrel (PLAVIX) 75 MG tablet Take 75 mg by mouth daily.    . furosemide (LASIX) 80 MG tablet Take 1 tablet (80 mg total) by mouth 2 (two) times daily. 180 tablet 3  . glucose blood test strip ACCU CHECK - Use as instructed once daily 100 each 12  . JANUVIA 100 MG tablet TAKE 1 TABLET BY MOUTH EVERY DAY 90 tablet 3  . Lancets MISC Use as directed three times daily with meals 300 each 3  . LANTUS SOLOSTAR 100 UNIT/ML Solostar Pen INJECT 10 UNITS INTO THE SKIN DAILY 45 mL 3  . LANTUS SOLOSTAR 100 UNIT/ML Solostar Pen INJECT 10 UNITS INTO THE SKIN DAILY 15 pen 1  . metoprolol (LOPRESSOR) 100 MG tablet Take 1 tablet (100 mg total) by  mouth 2 (two) times daily. 180 tablet 2  . nitroGLYCERIN (NITROSTAT) 0.4 MG SL tablet Place 0.4 mg under the tongue every 5 (five) minutes as needed for chest pain. Reported on 04/06/2016    . NITROSTAT 0.4 MG SL tablet PLACE 1 TABLET UNDER THE TONGUE EVERY 5 (FIVE) MINUTES AS NEEDED. 25 tablet 1  . omeprazole (PRILOSEC) 20 MG capsule TAKE 1 CAPSULE (20 MG TOTAL) BY MOUTH DAILY. 90 capsule 1  . pioglitazone (ACTOS) 45 MG tablet TAKE 1 TABLET BY MOUTH EVERY DAY 90 tablet 1  . risedronate (ACTONEL) 150 MG tablet TAKE 1 TABLET BY MOUTH ONCE A MONTH-TAKE ON THE SAME DAY OF EACH MONTH. 1 tablet 5  . timolol (BETIMOL) 0.5 % ophthalmic solution Place 1 drop into both eyes daily.     . vitamin B-12 (CYANOCOBALAMIN) 1000 MCG tablet Take 1,000 mcg by mouth daily.     No current facility-administered medications on file prior to visit.    Review of Systems  Constitutional: Negative for unusual diaphoresis or night sweats HENT: Negative for ear swelling or discharge Eyes: Negative for worsening visual haziness  Respiratory: Negative for choking and stridor.   Gastrointestinal: Negative for distension or worsening eructation Genitourinary: Negative for retention or change in urine volume.  Musculoskeletal: Negative for other MSK pain or swelling Skin: Negative for color change and worsening wound Neurological: Negative for tremors and numbness other than noted  Psychiatric/Behavioral: Negative for decreased concentration or agitation other than above   All other system neg per pt    Objective:   Physical Exam BP 136/82   Pulse 86   Temp 97.6 F (36.4 C)   Ht '4\' 11"'$  (1.499 m)   Wt 189 lb (85.7 kg)   SpO2 99%   BMI 38.17 kg/m  VS noted,  Constitutional: Pt appears in no apparent distress HENT: Head: NCAT.  Right Ear: External ear normal.  Left Ear: External ear normal.  Eyes: . Pupils are equal, round, and reactive to light. Conjunctivae and EOM are normal Neck: Normal range of motion. Neck  supple.  Cardiovascular: Normal rate and regular rhythm.   Pulmonary/Chest: Effort normal and breath sounds without rales or wheezing.  Neurological: Pt is alert. Not confused , motor grossly intact Skin: Skin is warm. No rash, no LE edema Psychiatric: Pt behavior is normal. No agitation. No depressed affect No other exam findings    Assessment & Plan:

## 2016-12-27 NOTE — Patient Instructions (Signed)
Please take all new medication as prescribed - the ambien  Please continue all other medications as before, and refills have been done if requested.  Please have the pharmacy call with any other refills you may need.  Please continue your efforts at being more active, low cholesterol diabetic diet, and weight control.  Please keep your appointments with your specialists as you may have planned

## 2017-01-01 NOTE — Assessment & Plan Note (Signed)
stable overall by history and exam, recent data reviewed with pt, and pt to continue medical treatment as before,  to f/u any worsening symptoms or concerns Lab Results  Component Value Date   HGBA1C 5.6 10/06/2016

## 2017-01-01 NOTE — Assessment & Plan Note (Signed)
Recent onset, for ambien prn,  to f/u any worsening symptoms or concerns

## 2017-01-01 NOTE — Assessment & Plan Note (Signed)
stable overall by history and exam, recent data reviewed with pt, and pt to continue medical treatment as before,  to f/u any worsening symptoms or concerns BP Readings from Last 3 Encounters:  12/27/16 136/82  12/20/16 (!) 154/82  12/09/16 (!) 124/47

## 2017-01-09 DIAGNOSIS — M1A09X0 Idiopathic chronic gout, multiple sites, without tophus (tophi): Secondary | ICD-10-CM | POA: Diagnosis not present

## 2017-01-09 DIAGNOSIS — Z79899 Other long term (current) drug therapy: Secondary | ICD-10-CM | POA: Diagnosis not present

## 2017-01-09 DIAGNOSIS — N183 Chronic kidney disease, stage 3 (moderate): Secondary | ICD-10-CM | POA: Diagnosis not present

## 2017-01-09 DIAGNOSIS — M79672 Pain in left foot: Secondary | ICD-10-CM | POA: Diagnosis not present

## 2017-01-12 ENCOUNTER — Encounter: Payer: Self-pay | Admitting: Internal Medicine

## 2017-01-13 ENCOUNTER — Encounter (HOSPITAL_COMMUNITY)
Admission: RE | Admit: 2017-01-13 | Discharge: 2017-01-13 | Disposition: A | Discharge status: Home / Self Care | Payer: Medicare Other | Source: Ambulatory Visit | Attending: Nephrology | Admitting: Nephrology

## 2017-01-13 DIAGNOSIS — N2581 Secondary hyperparathyroidism of renal origin: Secondary | ICD-10-CM | POA: Diagnosis not present

## 2017-01-13 DIAGNOSIS — E1129 Type 2 diabetes mellitus with other diabetic kidney complication: Secondary | ICD-10-CM | POA: Diagnosis not present

## 2017-01-13 DIAGNOSIS — D631 Anemia in chronic kidney disease: Secondary | ICD-10-CM | POA: Insufficient documentation

## 2017-01-13 DIAGNOSIS — N184 Chronic kidney disease, stage 4 (severe): Secondary | ICD-10-CM | POA: Diagnosis not present

## 2017-01-13 DIAGNOSIS — D638 Anemia in other chronic diseases classified elsewhere: Secondary | ICD-10-CM

## 2017-01-13 DIAGNOSIS — I129 Hypertensive chronic kidney disease with stage 1 through stage 4 chronic kidney disease, or unspecified chronic kidney disease: Secondary | ICD-10-CM | POA: Diagnosis not present

## 2017-01-13 LAB — IRON AND TIBC
IRON: 88 ug/dL (ref 28–170)
Saturation Ratios: 33 % — ABNORMAL HIGH (ref 10.4–31.8)
TIBC: 267 ug/dL (ref 250–450)
UIBC: 179 ug/dL

## 2017-01-13 LAB — FERRITIN: FERRITIN: 776 ng/mL — AB (ref 11–307)

## 2017-01-13 LAB — POCT HEMOGLOBIN-HEMACUE: Hemoglobin: 10.7 g/dL — ABNORMAL LOW (ref 12.0–15.0)

## 2017-01-13 MED ORDER — EPOETIN ALFA 10000 UNIT/ML IJ SOLN
20000.0000 [IU] | INTRAMUSCULAR | Status: DC
Start: 2017-01-13 — End: 2017-01-14

## 2017-01-13 MED ORDER — EPOETIN ALFA 20000 UNIT/ML IJ SOLN
INTRAMUSCULAR | Status: AC
  Administered 2017-01-13: 20000 [IU]
  Filled 2017-01-13: qty 1

## 2017-01-25 ENCOUNTER — Ambulatory Visit
Admission: RE | Admit: 2017-01-25 | Discharge: 2017-01-25 | Disposition: A | Discharge status: Home / Self Care | Payer: Medicare Other | Source: Ambulatory Visit | Attending: Internal Medicine | Admitting: Internal Medicine

## 2017-01-25 DIAGNOSIS — Z1239 Encounter for other screening for malignant neoplasm of breast: Secondary | ICD-10-CM

## 2017-01-25 DIAGNOSIS — Z1231 Encounter for screening mammogram for malignant neoplasm of breast: Secondary | ICD-10-CM | POA: Diagnosis not present

## 2017-02-17 ENCOUNTER — Encounter (HOSPITAL_COMMUNITY)
Admission: RE | Admit: 2017-02-17 | Discharge: 2017-02-17 | Disposition: A | Discharge status: Home / Self Care | Payer: Medicare Other | Source: Ambulatory Visit | Attending: Nephrology | Admitting: Nephrology

## 2017-02-17 DIAGNOSIS — N184 Chronic kidney disease, stage 4 (severe): Secondary | ICD-10-CM | POA: Diagnosis not present

## 2017-02-17 DIAGNOSIS — D638 Anemia in other chronic diseases classified elsewhere: Secondary | ICD-10-CM

## 2017-02-17 DIAGNOSIS — D631 Anemia in chronic kidney disease: Secondary | ICD-10-CM | POA: Diagnosis not present

## 2017-02-17 LAB — POCT HEMOGLOBIN-HEMACUE: HEMOGLOBIN: 12 g/dL (ref 12.0–15.0)

## 2017-02-17 MED ORDER — EPOETIN ALFA 10000 UNIT/ML IJ SOLN: 20000.0000 [IU] | INTRAMUSCULAR | Status: DC

## 2017-02-23 DIAGNOSIS — Z79899 Other long term (current) drug therapy: Secondary | ICD-10-CM | POA: Diagnosis not present

## 2017-02-23 DIAGNOSIS — N183 Chronic kidney disease, stage 3 (moderate): Secondary | ICD-10-CM | POA: Diagnosis not present

## 2017-02-23 DIAGNOSIS — M1A9XX1 Chronic gout, unspecified, with tophus (tophi): Secondary | ICD-10-CM | POA: Diagnosis not present

## 2017-02-23 DIAGNOSIS — M1A09X0 Idiopathic chronic gout, multiple sites, without tophus (tophi): Secondary | ICD-10-CM | POA: Diagnosis not present

## 2017-02-27 ENCOUNTER — Other Ambulatory Visit: Payer: Self-pay | Admitting: Internal Medicine

## 2017-03-03 ENCOUNTER — Encounter (HOSPITAL_COMMUNITY)
Admission: RE | Admit: 2017-03-03 | Discharge: 2017-03-03 | Disposition: A | Discharge status: Home / Self Care | Payer: Medicare Other | Source: Ambulatory Visit | Attending: Nephrology | Admitting: Nephrology

## 2017-03-03 DIAGNOSIS — D638 Anemia in other chronic diseases classified elsewhere: Secondary | ICD-10-CM

## 2017-03-03 DIAGNOSIS — D631 Anemia in chronic kidney disease: Secondary | ICD-10-CM | POA: Diagnosis not present

## 2017-03-03 DIAGNOSIS — N184 Chronic kidney disease, stage 4 (severe): Secondary | ICD-10-CM | POA: Insufficient documentation

## 2017-03-03 LAB — POCT HEMOGLOBIN-HEMACUE: HEMOGLOBIN: 11.4 g/dL — AB (ref 12.0–15.0)

## 2017-03-03 MED ORDER — EPOETIN ALFA 20000 UNIT/ML IJ SOLN
INTRAMUSCULAR | Status: AC
  Administered 2017-03-03: 20000 [IU]
  Filled 2017-03-03: qty 1

## 2017-03-03 MED ORDER — EPOETIN ALFA 10000 UNIT/ML IJ SOLN: 20000.0000 [IU] | INTRAMUSCULAR | Status: DC

## 2017-03-07 ENCOUNTER — Emergency Department (HOSPITAL_COMMUNITY)
Admission: EM | Admit: 2017-03-07 | Discharge: 2017-03-07 | Disposition: A | Discharge status: Home / Self Care | Payer: Medicare Other | Attending: Emergency Medicine | Admitting: Emergency Medicine

## 2017-03-07 ENCOUNTER — Encounter (HOSPITAL_COMMUNITY): Payer: Self-pay | Admitting: Nurse Practitioner

## 2017-03-07 DIAGNOSIS — Z87891 Personal history of nicotine dependence: Secondary | ICD-10-CM | POA: Diagnosis not present

## 2017-03-07 DIAGNOSIS — M545 Low back pain: Secondary | ICD-10-CM | POA: Diagnosis present

## 2017-03-07 DIAGNOSIS — I120 Hypertensive chronic kidney disease with stage 5 chronic kidney disease or end stage renal disease: Secondary | ICD-10-CM | POA: Diagnosis not present

## 2017-03-07 DIAGNOSIS — Z955 Presence of coronary angioplasty implant and graft: Secondary | ICD-10-CM | POA: Insufficient documentation

## 2017-03-07 DIAGNOSIS — E1122 Type 2 diabetes mellitus with diabetic chronic kidney disease: Secondary | ICD-10-CM | POA: Diagnosis not present

## 2017-03-07 DIAGNOSIS — M62830 Muscle spasm of back: Secondary | ICD-10-CM | POA: Diagnosis not present

## 2017-03-07 DIAGNOSIS — Z7982 Long term (current) use of aspirin: Secondary | ICD-10-CM | POA: Insufficient documentation

## 2017-03-07 DIAGNOSIS — I2510 Atherosclerotic heart disease of native coronary artery without angina pectoris: Secondary | ICD-10-CM | POA: Insufficient documentation

## 2017-03-07 DIAGNOSIS — R252 Cramp and spasm: Secondary | ICD-10-CM | POA: Diagnosis not present

## 2017-03-07 DIAGNOSIS — M5442 Lumbago with sciatica, left side: Secondary | ICD-10-CM | POA: Insufficient documentation

## 2017-03-07 DIAGNOSIS — Z794 Long term (current) use of insulin: Secondary | ICD-10-CM | POA: Insufficient documentation

## 2017-03-07 DIAGNOSIS — N186 End stage renal disease: Secondary | ICD-10-CM | POA: Diagnosis not present

## 2017-03-07 DIAGNOSIS — M5432 Sciatica, left side: Secondary | ICD-10-CM

## 2017-03-07 MED ORDER — CYCLOBENZAPRINE HCL 10 MG PO TABS: 10.0000 mg | ORAL_TABLET | Freq: Two times a day (BID) | ORAL | 0 refills | Status: DC | PRN

## 2017-03-07 MED ORDER — TRAMADOL HCL 50 MG PO TABS: 50.0000 mg | ORAL_TABLET | Freq: Four times a day (QID) | ORAL | 0 refills | Status: DC | PRN

## 2017-03-07 NOTE — ED Triage Notes (Addendum)
Pt presents with c/o lower back and hip pain. She has chronic lower back pain but the pain has been much worse today and has begun to radiate down her L leg. She denies weakness, falls. She has been taking tylenol with no relief.

## 2017-03-07 NOTE — ED Notes (Signed)
PT states understanding of care given, follow up care, and medication prescribed. PT ambulated from ED to car with a steady gait. 

## 2017-03-07 NOTE — ED Provider Notes (Signed)
[facility] DEPT Provider Note   CSN: [id number] Arrival date & time: 03/07/17  1748     History   Chief Complaint Chief Complaint  Patient presents with  . Back Pain    [name] is a 71 y.o. female.  Patient presents to the ED with a chief complaint of left sided low back pain.  She states that the pain started at 2:00 pm this afternoon.  She denies any injury.  She states that she had a catch in her back.  She took Tylenol with good relief.  She states that the pain does radiate into her left buttock.  She denies any fevers, chills, bowel/bladder incontinence, dysuria, or hematuria.  Denies any lower extremity weakness or numbness.  There are no other associated symptoms.  Her symptoms have been improving since onset.   The history is provided by the patient. No language interpreter was used.    Past Medical History:  Diagnosis Date  . Anemia, iron deficiency   . Arthritis   . CAD (coronary artery disease)   . Carotid stenosis    Carotid US (02/2014):  Bilateral ICA 40-59%; > 50% L ECA; F/u 1 year  . Cataract    removed both eyes  . Chronic kidney disease    chronic renal failure  . DM2 (diabetes mellitus, type 2) ([place])   . GERD (gastroesophageal reflux disease)   . Glaucoma   . HLD (hyperlipidemia)   . HTN (hypertension)   . Hx of cardiovascular stress test    Lexiscan Myoview (03/24/14):  No ischemia, EF 73%, Normal  . Mitral regurgitation   . Osteoporosis   . PUD (peptic ulcer disease)   . PVD (peripheral vascular disease) ([place])   . Shortness of breath    with exertion    Patient Active Problem List   Diagnosis Date Noted  . Insomnia 12/27/2016  . Anemia of chronic disease 05/19/2016  . Second degree hemorrhoids 04/10/2016  . Aftercare following surgery of the circulatory system, [place] 03/26/2013  . Pain in joint, ankle and foot 03/13/2013  . End stage renal disease ([place]) 01/15/2013  . Bilateral foot pain 09/07/2012  . Sciatica neuralgia,  left 03/10/2012  . 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 Renal artery stenosis ([place]) 04/25/2011  . CKD (chronic kidney disease) 04/12/2011  . Encounter for preventative adult health care exam with abnormal findings 04/12/2011  . PERIPHERAL EDEMA 04/01/2010  . VITAMIN B12 DEFICIENCY 10/01/2009  . Nocturia 02/10/2009  . FATIGUE 12/18/2008  . Pain in joint, shoulder region 06/17/2008  . CERVICAL RADICULOPATHY 06/17/2008  . ANEMIA-IRON DEFICIENCY 12/13/2007  . PERIPHERAL VASCULAR DISEASE 12/13/2007  . Diabetes ([place]) 06/11/2007  . Hyperlipidemia 06/11/2007  . GLAUCOMA NOS 06/11/2007  . MITRAL REGURGITATION, 2 PLUS 06/11/2007  . Essential hypertension 06/11/2007  . CORONARY ARTERY DISEASE 06/11/2007  . CAROTID ARTERY STENOSIS 06/11/2007  . GERD 06/11/2007  . Peptic Ulcer, Site NOS 06/11/2007  . OSTEOPOROSIS 06/11/2007    Past Surgical History:  Procedure Laterality Date  . ABDOMINAL HYSTERECTOMY    . APPENDECTOMY    . AV FISTULA PLACEMENT Right 01/18/2013   Procedure: ARTERIOVENOUS (AV) FISTULA CREATION;  Surgeon: Rosetta Posner, MD;  Location: [facility];  Service: Vascular;  Laterality: Right;  Ultrasound guided  . COLONOSCOPY    . CORONARY ANGIOPLASTY WITH STENT PLACEMENT    . RENAL ANGIOGRAM N/A 09/16/2011   Procedure: RENAL ANGIOGRAM;  Surgeon: Sherren Mocha, MD;  Location: [facility] CATH LAB;  Service:  Cardiovascular;  Laterality: N/A;    OB History    No data available       Home Medications    Prior to Admission medications   Medication Sig Start Date End Date Taking? Authorizing Provider  acetaminophen (TYLENOL) 500 MG tablet Take 500-1,000 mg by mouth as needed.    [provider]  amLODipine (NORVASC) 10 MG tablet TAKE ONE (1) TABLET EACH DAY 07/11/16   Corwin Levins, MD  aspirin 81 MG tablet Take 81 mg by mouth daily.    [provider]  atorvastatin (LIPITOR) 80 MG tablet Take 1 tablet (80 mg total) by mouth daily at 6 PM. 01/08/16   Tonny Bollman, MD  BD PEN NEEDLE NANO U/F 32G X 4 MM MISC USE DAILY WITH LANTUS SOLASTAR 05/20/14   Corwin Levins, MD  BD PEN NEEDLE NANO U/F 32G X 4 MM MISC USE DAILY WITH LANTUS SOLASTAR 02/11/15   Corwin Levins, MD  blood glucose meter kit and supplies KIT Dispense based on patient and insurance preference. Use up to three times daily as directed. (FOR ICD-9 250.00, 250.01). 04/06/16   Corwin Levins, MD  calcitRIOL (ROCALTROL) 0.5 MCG capsule Take 0.5 mcg by mouth daily. 02/29/16   [provider]  clopidogrel (PLAVIX) 75 MG tablet Take 75 mg by mouth daily.    [provider]  ferrous sulfate 325 (65 FE) MG tablet TAKE 1 TABLET (325 MG TOTAL) BY MOUTH 2 (TWO) TIMES DAILY. 12/27/16   Corwin Levins, MD  furosemide (LASIX) 80 MG tablet Take 1 tablet (80 mg total) by mouth 2 (two) times daily. 09/07/12   Corwin Levins, MD  glucose blood test strip [facility] - Use as instructed once daily 10/06/16   Corwin Levins, MD  JANUVIA 100 MG tablet TAKE 1 TABLET BY MOUTH EVERY DAY 05/02/16   Corwin Levins, MD  Lancets MISC Use as directed three times daily with meals 04/06/16   Corwin Levins, MD  LANTUS SOLOSTAR 100 UNIT/ML Solostar Pen INJECT 10 UNITS INTO THE SKIN DAILY 12/12/16   Corwin Levins, MD  LANTUS SOLOSTAR 100 UNIT/ML Solostar Pen INJECT 10 UNITS INTO THE SKIN DAILY 12/14/16   Corwin Levins, MD  metoprolol (LOPRESSOR) 100 MG tablet TAKE 1 TABLET BY MOUTH TWICE A DAY 02/27/17   Corwin Levins, MD  nitroGLYCERIN (NITROSTAT) 0.4 MG SL tablet Place 0.4 mg under the tongue every 5 (five) minutes as needed for chest pain. Reported on 04/06/2016    [provider]  NITROSTAT 0.4 MG SL tablet PLACE 1 TABLET UNDER THE TONGUE EVERY 5 (FIVE) MINUTES AS NEEDED. 07/13/16   Tereso Newcomer T, PA-C  omeprazole (PRILOSEC) 20 MG capsule TAKE 1 CAPSULE (20 MG TOTAL) BY MOUTH DAILY. 10/07/16   Corwin Levins, MD  pioglitazone (ACTOS) 45 MG tablet TAKE 1 TABLET BY MOUTH EVERY DAY 02/27/17   Corwin Levins, MD    Potassium Chloride ER 20 MEQ TBCR Take 1 tablet by mouth daily. 12/14/16   [provider]  risedronate (ACTONEL) 150 MG tablet TAKE 1 TABLET BY MOUTH ONCE A MONTH-TAKE ON THE SAME DAY OF EACH MONTH. 10/05/16   Corwin Levins, MD  timolol (BETIMOL) 0.5 % ophthalmic solution Place 1 drop into both eyes daily.     [provider]  vitamin B-12 (CYANOCOBALAMIN) 1000 MCG tablet Take 1,000 mcg by mouth daily.    [provider]  zolpidem (AMBIEN) 5  MG tablet Take 1 tablet (5 mg total) by mouth at bedtime as needed for sleep. 12/27/16   [name], MD    Family History Family History  Problem Relation Age of Onset  . Heart disease Father   . Glaucoma Father   . Diabetes Sister   . Diabetes Son   . Colon cancer Neg Hx   . Colon polyps Neg Hx   . Esophageal cancer Neg Hx   . Rectal cancer Neg Hx   . Stomach cancer Neg Hx     Social History Social History  Substance Use Topics  . Smoking status: Former Smoker    Quit date: 10/25/2007  . Smokeless tobacco: Never Used     Comment: Stopped 1.5 yrs ago January 2009  . Alcohol use No     Allergies   Patient has no known allergies.   Review of Systems Review of Systems  All other systems reviewed and are negative.    Physical Exam Updated Vital Signs BP 124/84 (BP Location: Right Wrist)   Pulse 72   Temp 97.7 F (36.5 C) (Oral)   Resp 18   SpO2 99%   Physical Exam Physical Exam  Constitutional: Pt appears well-developed and well-nourished. No distress.  HENT:  Head: Normocephalic and atraumatic.  Mouth/Throat: Oropharynx is clear and moist. No oropharyngeal exudate.  Eyes: Conjunctivae are normal.  Neck: Normal range of motion. Neck supple.  No meningismus Cardiovascular: Normal rate, regular rhythm and intact distal pulses.   Pulmonary/Chest: Effort normal and breath sounds normal. No respiratory distress. Pt has no wheezes.  Abdominal: Pt exhibits no distension Musculoskeletal:  Left lumbar  tender to palpation, no bony CTLS spine tenderness, deformity, step-off, or crepitus Lymphadenopathy: Pt has no cervical adenopathy.  Neurological: Pt is alert and oriented Speech is clear and goal oriented, follows commands Normal 5/5 strength in upper and lower extremities bilaterally including dorsiflexion and plantar flexion, strong and equal grip strength Sensation intact Great toe extension intact Moves extremities without ataxia, coordination intact Normal gait Normal balance No Clonus Skin: Skin is warm and dry. No rash noted. Pt is not diaphoretic. No erythema.  Psychiatric: Pt has a normal mood and affect. Behavior is normal.  Nursing note and vitals reviewed.   ED Treatments / Results  Labs (all labs ordered are listed, but only abnormal results are displayed) Labs Reviewed - No data to display  EKG  EKG Interpretation None       Radiology No results found.  Procedures Procedures (including critical care time)  Medications Ordered in ED Medications - No data to display   Initial Impression / Assessment and Plan / ED Course  I have reviewed the triage vital signs and the nursing notes.  Pertinent labs & imaging results that were available during my care of the patient were reviewed by me and considered in my medical decision making (see chart for details).     Patient with back pain.  No neurological deficits and normal neuro exam.  Patient is ambulatory.  No loss of bowel or bladder control.  Doubt cauda equina.  Denies fever,  doubt epidural abscess or other lesion. Recommend back exercises, stretching, RICE, and will treat with a short course of Ultram and Flexeril.  Encouraged the patient that there could be a need for additional workup and/or imaging such as MRI, if the symptoms do not resolve. Patient advised that if the back pain does not resolve, or radiates, this could progress to more serious conditions  and is encouraged to follow-up with PCP or  orthopedics within 2 weeks.     Final Clinical Impressions(s) / ED Diagnoses   Final diagnoses:  Sciatica of left side  Muscle spasm of back    New Prescriptions New Prescriptions   CYCLOBENZAPRINE (FLEXERIL) 10 MG TABLET    Take 1 tablet (10 mg total) by mouth 2 (two) times daily as needed for muscle spasms.   TRAMADOL (ULTRAM) 50 MG TABLET    Take 1 tablet (50 mg total) by mouth every 6 (six) hours as needed.     Montine Circle, PA-C 03/07/17 2227    Fatima Blank, MD 03/08/17 [phone]

## 2017-03-09 ENCOUNTER — Encounter: Payer: Self-pay | Admitting: Internal Medicine

## 2017-03-09 ENCOUNTER — Ambulatory Visit (INDEPENDENT_AMBULATORY_CARE_PROVIDER_SITE_OTHER): Payer: Medicare Other | Admitting: Internal Medicine

## 2017-03-09 ENCOUNTER — Other Ambulatory Visit (INDEPENDENT_AMBULATORY_CARE_PROVIDER_SITE_OTHER): Payer: Medicare Other

## 2017-03-09 VITALS — BP 144/80 | HR 80 | Ht 61.0 in | Wt 188.0 lb

## 2017-03-09 DIAGNOSIS — R351 Nocturia: Secondary | ICD-10-CM

## 2017-03-09 DIAGNOSIS — I10 Essential (primary) hypertension: Secondary | ICD-10-CM | POA: Diagnosis not present

## 2017-03-09 DIAGNOSIS — Z0001 Encounter for general adult medical examination with abnormal findings: Secondary | ICD-10-CM

## 2017-03-09 DIAGNOSIS — E119 Type 2 diabetes mellitus without complications: Secondary | ICD-10-CM

## 2017-03-09 DIAGNOSIS — M545 Low back pain, unspecified: Secondary | ICD-10-CM | POA: Insufficient documentation

## 2017-03-09 DIAGNOSIS — Z0000 Encounter for general adult medical examination without abnormal findings: Secondary | ICD-10-CM | POA: Diagnosis not present

## 2017-03-09 DIAGNOSIS — R7989 Other specified abnormal findings of blood chemistry: Secondary | ICD-10-CM | POA: Diagnosis not present

## 2017-03-09 LAB — CBC WITH DIFFERENTIAL/PLATELET
BASOS PCT: 1.1 % (ref 0.0–3.0)
Basophils Absolute: 0.1 10*3/uL (ref 0.0–0.1)
Eosinophils Absolute: 0.2 10*3/uL (ref 0.0–0.7)
Eosinophils Relative: 3 % (ref 0.0–5.0)
HCT: 33.8 % — ABNORMAL LOW (ref 36.0–46.0)
HEMOGLOBIN: 10.9 g/dL — AB (ref 12.0–15.0)
Lymphocytes Relative: 23.4 % (ref 12.0–46.0)
Lymphs Abs: 1.5 10*3/uL (ref 0.7–4.0)
MCHC: 32.3 g/dL (ref 30.0–36.0)
MCV: 84.8 fl (ref 78.0–100.0)
MONO ABS: 0.6 10*3/uL (ref 0.1–1.0)
Monocytes Relative: 9.4 % (ref 3.0–12.0)
NEUTROS ABS: 4 10*3/uL (ref 1.4–7.7)
Neutrophils Relative %: 63.1 % (ref 43.0–77.0)
Platelets: 349 10*3/uL (ref 150.0–400.0)
RBC: 3.99 Mil/uL (ref 3.87–5.11)
RDW: 15.9 % — AB (ref 11.5–15.5)
WBC: 6.3 10*3/uL (ref 4.0–10.5)

## 2017-03-09 LAB — URINALYSIS, ROUTINE W REFLEX MICROSCOPIC
BILIRUBIN URINE: NEGATIVE
HGB URINE DIPSTICK: NEGATIVE
Ketones, ur: NEGATIVE
NITRITE: NEGATIVE
PH: 5.5 (ref 5.0–8.0)
Specific Gravity, Urine: 1.015 (ref 1.000–1.030)
Total Protein, Urine: NEGATIVE
Urine Glucose: NEGATIVE
Urobilinogen, UA: 0.2 (ref 0.0–1.0)

## 2017-03-09 LAB — MICROALBUMIN / CREATININE URINE RATIO
Creatinine,U: 119.3 mg/dL
Microalb Creat Ratio: 1.3 mg/g (ref 0.0–30.0)
Microalb, Ur: 1.5 mg/dL (ref 0.0–1.9)

## 2017-03-09 LAB — HEMOGLOBIN A1C: Hgb A1c MFr Bld: 5.9 % (ref 4.6–6.5)

## 2017-03-09 MED ORDER — LANCETS MISC: 3 refills | Status: AC

## 2017-03-09 MED ORDER — ACCU-CHEK AVIVA PLUS W/DEVICE KIT: PACK | 0 refills | Status: DC

## 2017-03-09 MED ORDER — CYCLOBENZAPRINE HCL 10 MG PO TABS: 10.0000 mg | ORAL_TABLET | Freq: Two times a day (BID) | ORAL | 1 refills | Status: DC | PRN

## 2017-03-09 MED ORDER — TRAMADOL HCL 50 MG PO TABS: 50.0000 mg | ORAL_TABLET | Freq: Four times a day (QID) | ORAL | 1 refills | Status: DC | PRN

## 2017-03-09 MED ORDER — GLUCOSE BLOOD VI STRP: ORAL_STRIP | 12 refills | Status: DC

## 2017-03-09 NOTE — Progress Notes (Signed)
Subjective:    Patient ID: [name], female    DOB: 02-09-1946, 71 y.o.   MRN: [id number]  HPI Here to f/u after patient presented to the ED 5/15 with a chief complaint of left sided low back pain, dx with left sciatica and muscle spasm, tx with flexeril and tramadol. Pt continues to c/o this but no bowel or bladder change, fever, wt loss,  worsening LE pain/numbness/weakness, gait change or falls, except for nocturia last few evenings  Pt denies fever, wt loss, night sweats, loss of appetite, or other constitutional symptoms   Pt denies polydipsia, polyuria, .  Pt states overall good compliance with meds   Past Medical History:  Diagnosis Date  . Anemia, iron deficiency   . Arthritis   . CAD (coronary artery disease)   . Carotid stenosis    Carotid US (02/2014):  Bilateral ICA 40-59%; > 50% L ECA; F/u 1 year  . Cataract    removed both eyes  . Chronic kidney disease    chronic renal failure  . DM2 (diabetes mellitus, type 2) ([place])   . GERD (gastroesophageal reflux disease)   . Glaucoma   . HLD (hyperlipidemia)   . HTN (hypertension)   . Hx of cardiovascular stress test    Lexiscan Myoview (03/24/14):  No ischemia, EF 73%, Normal  . Mitral regurgitation   . Osteoporosis   . PUD (peptic ulcer disease)   . PVD (peripheral vascular disease) ([place])   . Shortness of breath    with exertion   Past Surgical History:  Procedure Laterality Date  . ABDOMINAL HYSTERECTOMY    . APPENDECTOMY    . AV FISTULA PLACEMENT Right 01/18/2013   Procedure: ARTERIOVENOUS (AV) FISTULA CREATION;  Surgeon: Rosetta Posner, MD;  Location: [facility];  Service: Vascular;  Laterality: Right;  Ultrasound guided  . COLONOSCOPY    . CORONARY ANGIOPLASTY WITH STENT PLACEMENT    . RENAL ANGIOGRAM N/A 09/16/2011   Procedure: RENAL ANGIOGRAM;  Surgeon: Sherren Mocha, MD;  Location: [facility] CATH LAB;  Service: Cardiovascular;  Laterality: N/A;    reports that she quit smoking about 9 years ago. She has never used  smokeless tobacco. She reports that she does not drink alcohol or use drugs. family history includes Diabetes in her sister and son; Glaucoma in her father; Heart disease in her father. No Known Allergies Current Outpatient Prescriptions on File Prior to Visit  Medication Sig Dispense Refill  . acetaminophen (TYLENOL) 500 MG tablet Take 500-1,000 mg by mouth as needed.    Marland Kitchen amLODipine (NORVASC) 10 MG tablet TAKE ONE (1) TABLET EACH DAY 90 tablet 2  . aspirin 81 MG tablet Take 81 mg by mouth daily.    . BD PEN NEEDLE NANO U/F 32G X 4 MM MISC USE DAILY WITH LANTUS SOLASTAR 100 each 11  . blood glucose meter kit and supplies KIT Dispense based on patient and insurance preference. Use up to three times daily as directed. (FOR ICD-9 250.00, 250.01). 1 each 0  . calcitRIOL (ROCALTROL) 0.5 MCG capsule Take 0.5 mcg by mouth daily.  6  . clopidogrel (PLAVIX) 75 MG tablet Take 75 mg by mouth daily.    . ferrous sulfate 325 (65 FE) MG tablet TAKE 1 TABLET (325 MG TOTAL) BY MOUTH 2 (TWO) TIMES DAILY. 180 tablet 1  . furosemide (LASIX) 80 MG tablet Take 1 tablet (80 mg total) by mouth 2 (two) times daily. 180 tablet 3  . JANUVIA 100 MG tablet  TAKE 1 TABLET BY MOUTH EVERY DAY 90 tablet 3  . LANTUS SOLOSTAR 100 UNIT/ML Solostar Pen INJECT 10 UNITS INTO THE SKIN DAILY 45 mL 3  . metoprolol (LOPRESSOR) 100 MG tablet TAKE 1 TABLET BY MOUTH TWICE A DAY 180 tablet 1  . nitroGLYCERIN (NITROSTAT) 0.4 MG SL tablet Place 0.4 mg under the tongue every 5 (five) minutes as needed for chest pain. Reported on 04/06/2016    . omeprazole (PRILOSEC) 20 MG capsule TAKE 1 CAPSULE (20 MG TOTAL) BY MOUTH DAILY. 90 capsule 1  . pioglitazone (ACTOS) 45 MG tablet TAKE 1 TABLET BY MOUTH EVERY DAY 90 tablet 1  . Potassium Chloride ER 20 MEQ TBCR Take 1 tablet by mouth daily.  6  . risedronate (ACTONEL) 150 MG tablet TAKE 1 TABLET BY MOUTH ONCE A MONTH-TAKE ON THE SAME DAY OF EACH MONTH. 1 tablet 5  . timolol (BETIMOL) 0.5 % ophthalmic  solution Place 1 drop into both eyes daily.     . vitamin B-12 (CYANOCOBALAMIN) 1000 MCG tablet Take 1,000 mcg by mouth daily.    Marland Kitchen zolpidem (AMBIEN) 5 MG tablet Take 1 tablet (5 mg total) by mouth at bedtime as needed for sleep. 30 tablet 5   No current facility-administered medications on file prior to visit.    Review of Systems  Constitutional: Negative for other unusual diaphoresis or sweats HENT: Negative for ear discharge or swelling Eyes: Negative for other worsening visual disturbances Respiratory: Negative for stridor or other swelling  Gastrointestinal: Negative for worsening distension or other blood Genitourinary: Negative for retention or other urinary change Musculoskeletal: Negative for other MSK pain or swelling Skin: Negative for color change or other new lesions Neurological: Negative for worsening tremors and other numbness  Psychiatric/Behavioral: Negative for worsening agitation or other fatigue All other system neg per pt    Objective:   Physical Exam BP (!) 144/80   Pulse 80   Ht '5\' 1"'$  (1.549 m)   Wt 188 lb (85.3 kg)   SpO2 99%   BMI 35.52 kg/m  VS noted,  Constitutional: Pt appears in NAD HENT: Head: NCAT.  Right Ear: External ear normal.  Left Ear: External ear normal.  Eyes: . Pupils are equal, round, and reactive to light. Conjunctivae and EOM are normal Nose: without d/c or deformity Neck: Neck supple. Gross normal ROM Cardiovascular: Normal rate and regular rhythm.   Pulmonary/Chest: Effort normal and breath sounds without rales or wheezing.  Abd:  Soft, NT, ND, + BS, no organomegaly Spine: nontender midline but with left lumbar paravertebral tender Neurological: Pt is alert. At baseline orientation, motor 5/5 intact Skin: Skin is warm. No rashes, other new lesions, no LE edema Psychiatric: Pt behavior is normal without agitation  No other exam findings    Assessment & Plan:

## 2017-03-09 NOTE — Patient Instructions (Addendum)
Please continue all other medications as before, and refills have been done if requested - the muscle relaxer and pain medication  Your accucheck machine and strips were sent to the pharmacy  Please have the pharmacy call with any other refills you may need.  Please continue your efforts at being more active, low cholesterol diabetic diet, and weight control.  Please keep your appointments with your specialists as you may have planned  Please go to the LAB in the Basement (turn left off the elevator) for the tests to be done today - just the urine test today  You will be contacted by phone if any changes need to be made immediately.  Otherwise, you will receive a letter about your results with an explanation, but please check with MyChart first.  Please remember to sign up for MyChart if you have not done so, as this will be important to you in the future with finding out test results, communicating by private email, and scheduling acute appointments online when needed.  Please return in 3 months, or sooner if needed, with Lab testing done 3-5 days before

## 2017-03-10 ENCOUNTER — Other Ambulatory Visit: Payer: Self-pay | Admitting: Internal Medicine

## 2017-03-10 LAB — LDL CHOLESTEROL, DIRECT: Direct LDL: 140 mg/dL

## 2017-03-10 LAB — LIPID PANEL
CHOLESTEROL: 296 mg/dL — AB (ref 0–200)
HDL: 23.7 mg/dL — AB (ref >39.00)
Total CHOL/HDL Ratio: 12

## 2017-03-10 LAB — BASIC METABOLIC PANEL
BUN: 37 mg/dL — AB (ref 6–23)
CHLORIDE: 102 meq/L (ref 96–112)
CO2: 21 mEq/L (ref 19–32)
CREATININE: 2.61 mg/dL — AB (ref 0.40–1.20)
Calcium: 9.2 mg/dL (ref 8.4–10.5)
GFR: 23.12 mL/min — ABNORMAL LOW (ref >60.00)
Glucose, Bld: 79 mg/dL (ref 70–99)
Potassium: 4 mEq/L (ref 3.5–5.1)
Sodium: 136 mEq/L (ref 135–145)

## 2017-03-10 LAB — HEPATIC FUNCTION PANEL
ALT: 11 U/L (ref 0–35)
AST: 12 U/L (ref 0–37)
Albumin: 3.4 g/dL — ABNORMAL LOW (ref 3.5–5.2)
Alkaline Phosphatase: 82 U/L (ref 39–117)
BILIRUBIN TOTAL: 0.3 mg/dL (ref 0.2–1.2)
Bilirubin, Direct: 0 mg/dL (ref 0.0–0.3)
TOTAL PROTEIN: 6.8 g/dL (ref 6.0–8.3)

## 2017-03-10 LAB — TSH: TSH: 3.89 u[IU]/mL (ref 0.35–4.50)

## 2017-03-10 MED ORDER — ATORVASTATIN CALCIUM 80 MG PO TABS: 80.0000 mg | ORAL_TABLET | Freq: Every day | ORAL | 3 refills | Status: DC

## 2017-03-10 MED ORDER — CEPHALEXIN 500 MG PO CAPS: 500.0000 mg | ORAL_CAPSULE | Freq: Three times a day (TID) | ORAL | 0 refills | Status: AC

## 2017-03-12 NOTE — Assessment & Plan Note (Signed)
Not clear if related to back pain (suspect not), but for UA - r/o UTI

## 2017-03-12 NOTE — Assessment & Plan Note (Signed)
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

## 2017-03-12 NOTE — Assessment & Plan Note (Signed)
stable overall by history and exam, recent data reviewed with pt, and pt to continue medical treatment as before,  to f/u any worsening symptoms or concerns BP Readings from Last 3 Encounters:  03/09/17 (!) 144/80  03/07/17 (!) 150/65  03/03/17 (!) 136/57

## 2017-03-12 NOTE — Assessment & Plan Note (Signed)
stable overall by history and exam, recent data reviewed with pt, and pt to continue medical treatment as before,  to f/u any worsening symptoms or concerns Lab Results  Component Value Date   HGBA1C 5.9 03/09/2017

## 2017-03-23 ENCOUNTER — Other Ambulatory Visit: Payer: Self-pay | Admitting: Internal Medicine

## 2017-03-23 NOTE — Telephone Encounter (Signed)
Please ask pt to make ROV

## 2017-03-29 DIAGNOSIS — N2581 Secondary hyperparathyroidism of renal origin: Secondary | ICD-10-CM | POA: Diagnosis not present

## 2017-03-29 DIAGNOSIS — I129 Hypertensive chronic kidney disease with stage 1 through stage 4 chronic kidney disease, or unspecified chronic kidney disease: Secondary | ICD-10-CM | POA: Diagnosis not present

## 2017-03-29 DIAGNOSIS — N184 Chronic kidney disease, stage 4 (severe): Secondary | ICD-10-CM | POA: Diagnosis not present

## 2017-03-29 DIAGNOSIS — E1129 Type 2 diabetes mellitus with other diabetic kidney complication: Secondary | ICD-10-CM | POA: Diagnosis not present

## 2017-03-29 DIAGNOSIS — D631 Anemia in chronic kidney disease: Secondary | ICD-10-CM | POA: Diagnosis not present

## 2017-04-02 ENCOUNTER — Other Ambulatory Visit: Payer: Self-pay | Admitting: Internal Medicine

## 2017-04-06 ENCOUNTER — Ambulatory Visit: Payer: Medicare Other | Admitting: Internal Medicine

## 2017-04-07 ENCOUNTER — Encounter (HOSPITAL_COMMUNITY)
Admission: RE | Admit: 2017-04-07 | Discharge: 2017-04-07 | Disposition: A | Discharge status: Home / Self Care | Payer: Medicare Other | Source: Ambulatory Visit | Attending: Nephrology | Admitting: Nephrology

## 2017-04-07 DIAGNOSIS — D631 Anemia in chronic kidney disease: Secondary | ICD-10-CM | POA: Insufficient documentation

## 2017-04-07 DIAGNOSIS — N184 Chronic kidney disease, stage 4 (severe): Secondary | ICD-10-CM | POA: Diagnosis not present

## 2017-04-07 DIAGNOSIS — D638 Anemia in other chronic diseases classified elsewhere: Secondary | ICD-10-CM

## 2017-04-07 LAB — IRON AND TIBC
IRON: 63 ug/dL (ref 28–170)
Saturation Ratios: 23 % (ref 10.4–31.8)
TIBC: 277 ug/dL (ref 250–450)
UIBC: 214 ug/dL

## 2017-04-07 LAB — POCT HEMOGLOBIN-HEMACUE: Hemoglobin: 10.6 g/dL — ABNORMAL LOW (ref 12.0–15.0)

## 2017-04-07 LAB — FERRITIN: Ferritin: 616 ng/mL — ABNORMAL HIGH (ref 11–307)

## 2017-04-07 MED ORDER — EPOETIN ALFA 20000 UNIT/ML IJ SOLN
INTRAMUSCULAR | Status: AC
Start: 2017-04-07 — End: 2017-04-07
  Administered 2017-04-07: 20000 [IU]
  Filled 2017-04-07: qty 1

## 2017-04-07 MED ORDER — EPOETIN ALFA 10000 UNIT/ML IJ SOLN: 20000.0000 [IU] | INTRAMUSCULAR | Status: DC

## 2017-04-08 ENCOUNTER — Other Ambulatory Visit: Payer: Self-pay | Admitting: Internal Medicine

## 2017-04-10 ENCOUNTER — Other Ambulatory Visit: Payer: Self-pay | Admitting: Internal Medicine

## 2017-04-14 ENCOUNTER — Other Ambulatory Visit: Payer: Self-pay | Admitting: Internal Medicine

## 2017-04-24 ENCOUNTER — Ambulatory Visit: Payer: Medicare Other | Admitting: Cardiovascular Disease

## 2017-04-29 ENCOUNTER — Other Ambulatory Visit: Payer: Self-pay | Admitting: Internal Medicine

## 2017-05-02 DIAGNOSIS — Z79899 Other long term (current) drug therapy: Secondary | ICD-10-CM | POA: Diagnosis not present

## 2017-05-02 DIAGNOSIS — N183 Chronic kidney disease, stage 3 (moderate): Secondary | ICD-10-CM | POA: Diagnosis not present

## 2017-05-02 DIAGNOSIS — M1A9XX1 Chronic gout, unspecified, with tophus (tophi): Secondary | ICD-10-CM | POA: Diagnosis not present

## 2017-05-02 DIAGNOSIS — M1A09X0 Idiopathic chronic gout, multiple sites, without tophus (tophi): Secondary | ICD-10-CM | POA: Diagnosis not present

## 2017-05-12 ENCOUNTER — Encounter (HOSPITAL_COMMUNITY)
Admission: RE | Admit: 2017-05-12 | Discharge: 2017-05-12 | Disposition: A | Discharge status: Home / Self Care | Payer: Medicare Other | Source: Ambulatory Visit | Attending: Nephrology | Admitting: Nephrology

## 2017-05-12 DIAGNOSIS — D638 Anemia in other chronic diseases classified elsewhere: Secondary | ICD-10-CM

## 2017-05-12 DIAGNOSIS — D631 Anemia in chronic kidney disease: Secondary | ICD-10-CM | POA: Insufficient documentation

## 2017-05-12 DIAGNOSIS — N184 Chronic kidney disease, stage 4 (severe): Secondary | ICD-10-CM | POA: Diagnosis not present

## 2017-05-12 MED ORDER — EPOETIN ALFA 20000 UNIT/ML IJ SOLN
INTRAMUSCULAR | Status: AC
  Administered 2017-05-12: 14:00:00 20000 [IU] via SUBCUTANEOUS
  Filled 2017-05-12: qty 1

## 2017-05-12 MED ORDER — EPOETIN ALFA 10000 UNIT/ML IJ SOLN: 20000.0000 [IU] | INTRAMUSCULAR | Status: DC

## 2017-05-15 LAB — POCT HEMOGLOBIN-HEMACUE: HEMOGLOBIN: 11.1 g/dL — AB (ref 12.0–15.0)

## 2017-05-28 ENCOUNTER — Other Ambulatory Visit: Payer: Self-pay | Admitting: Internal Medicine

## 2017-06-02 DIAGNOSIS — N2581 Secondary hyperparathyroidism of renal origin: Secondary | ICD-10-CM | POA: Diagnosis not present

## 2017-06-02 DIAGNOSIS — E1129 Type 2 diabetes mellitus with other diabetic kidney complication: Secondary | ICD-10-CM | POA: Diagnosis not present

## 2017-06-02 DIAGNOSIS — D631 Anemia in chronic kidney disease: Secondary | ICD-10-CM | POA: Diagnosis not present

## 2017-06-02 DIAGNOSIS — I129 Hypertensive chronic kidney disease with stage 1 through stage 4 chronic kidney disease, or unspecified chronic kidney disease: Secondary | ICD-10-CM | POA: Diagnosis not present

## 2017-06-02 DIAGNOSIS — N184 Chronic kidney disease, stage 4 (severe): Secondary | ICD-10-CM | POA: Diagnosis not present

## 2017-06-08 ENCOUNTER — Ambulatory Visit (INDEPENDENT_AMBULATORY_CARE_PROVIDER_SITE_OTHER): Payer: Medicare Other | Admitting: Internal Medicine

## 2017-06-08 ENCOUNTER — Other Ambulatory Visit: Payer: Self-pay | Admitting: Internal Medicine

## 2017-06-08 ENCOUNTER — Encounter: Payer: Self-pay | Admitting: Internal Medicine

## 2017-06-08 ENCOUNTER — Other Ambulatory Visit (INDEPENDENT_AMBULATORY_CARE_PROVIDER_SITE_OTHER): Payer: Medicare Other

## 2017-06-08 VITALS — BP 124/82 | HR 76 | Temp 97.9°F | Ht 61.0 in | Wt 194.0 lb

## 2017-06-08 DIAGNOSIS — E119 Type 2 diabetes mellitus without complications: Secondary | ICD-10-CM

## 2017-06-08 DIAGNOSIS — R200 Anesthesia of skin: Secondary | ICD-10-CM | POA: Insufficient documentation

## 2017-06-08 DIAGNOSIS — E2839 Other primary ovarian failure: Secondary | ICD-10-CM

## 2017-06-08 DIAGNOSIS — Z0000 Encounter for general adult medical examination without abnormal findings: Secondary | ICD-10-CM

## 2017-06-08 DIAGNOSIS — N189 Chronic kidney disease, unspecified: Secondary | ICD-10-CM | POA: Diagnosis not present

## 2017-06-08 DIAGNOSIS — Z0001 Encounter for general adult medical examination with abnormal findings: Secondary | ICD-10-CM

## 2017-06-08 DIAGNOSIS — I10 Essential (primary) hypertension: Secondary | ICD-10-CM | POA: Diagnosis not present

## 2017-06-08 DIAGNOSIS — Z1159 Encounter for screening for other viral diseases: Secondary | ICD-10-CM | POA: Diagnosis not present

## 2017-06-08 LAB — CBC WITH DIFFERENTIAL/PLATELET
BASOS PCT: 1.2 % (ref 0.0–3.0)
Basophils Absolute: 0.1 10*3/uL (ref 0.0–0.1)
EOS ABS: 0.2 10*3/uL (ref 0.0–0.7)
Eosinophils Relative: 2.2 % (ref 0.0–5.0)
HEMATOCRIT: 31 % — AB (ref 36.0–46.0)
Hemoglobin: 10.2 g/dL — ABNORMAL LOW (ref 12.0–15.0)
LYMPHS ABS: 1.7 10*3/uL (ref 0.7–4.0)
LYMPHS PCT: 20.7 % (ref 12.0–46.0)
MCHC: 32.8 g/dL (ref 30.0–36.0)
MCV: 86.9 fl (ref 78.0–100.0)
Monocytes Absolute: 0.5 10*3/uL (ref 0.1–1.0)
Monocytes Relative: 6.4 % (ref 3.0–12.0)
NEUTROS ABS: 5.6 10*3/uL (ref 1.4–7.7)
Neutrophils Relative %: 69.5 % (ref 43.0–77.0)
PLATELETS: 375 10*3/uL (ref 150.0–400.0)
RBC: 3.57 Mil/uL — ABNORMAL LOW (ref 3.87–5.11)
RDW: 17.5 % — AB (ref 11.5–15.5)
WBC: 8.1 10*3/uL (ref 4.0–10.5)

## 2017-06-08 LAB — URINALYSIS, ROUTINE W REFLEX MICROSCOPIC
Bilirubin Urine: NEGATIVE
HGB URINE DIPSTICK: NEGATIVE
Ketones, ur: NEGATIVE
Nitrite: NEGATIVE
Specific Gravity, Urine: 1.01 (ref 1.000–1.030)
Total Protein, Urine: NEGATIVE
Urine Glucose: NEGATIVE
Urobilinogen, UA: 0.2 (ref 0.0–1.0)
pH: 6 (ref 5.0–8.0)

## 2017-06-08 LAB — HEMOGLOBIN A1C: Hgb A1c MFr Bld: 5.4 % (ref 4.6–6.5)

## 2017-06-08 LAB — BASIC METABOLIC PANEL
BUN: 43 mg/dL — ABNORMAL HIGH (ref 6–23)
CHLORIDE: 100 meq/L (ref 96–112)
CO2: 28 mEq/L (ref 19–32)
CREATININE: 2.61 mg/dL — AB (ref 0.40–1.20)
Calcium: 8.4 mg/dL (ref 8.4–10.5)
GFR: 23.1 mL/min — ABNORMAL LOW (ref >60.00)
Glucose, Bld: 173 mg/dL — ABNORMAL HIGH (ref 70–99)
POTASSIUM: 3 meq/L — AB (ref 3.5–5.1)
SODIUM: 138 meq/L (ref 135–145)

## 2017-06-08 LAB — LIPID PANEL
CHOL/HDL RATIO: 8
Cholesterol: 206 mg/dL — ABNORMAL HIGH (ref 0–200)
HDL: 24.7 mg/dL — ABNORMAL LOW (ref >39.00)
NONHDL: 181.7
TRIGLYCERIDES: 330 mg/dL — AB (ref 0.0–149.0)
VLDL: 66 mg/dL — ABNORMAL HIGH (ref 0.0–40.0)

## 2017-06-08 LAB — HEPATIC FUNCTION PANEL
ALK PHOS: 102 U/L (ref 39–117)
ALT: 11 U/L (ref 0–35)
AST: 12 U/L (ref 0–37)
Albumin: 3.1 g/dL — ABNORMAL LOW (ref 3.5–5.2)
BILIRUBIN DIRECT: 0.1 mg/dL (ref 0.0–0.3)
BILIRUBIN TOTAL: 0.3 mg/dL (ref 0.2–1.2)
Total Protein: 6.1 g/dL (ref 6.0–8.3)

## 2017-06-08 LAB — MICROALBUMIN / CREATININE URINE RATIO
Creatinine,U: 72.7 mg/dL
MICROALB/CREAT RATIO: 1 mg/g (ref 0.0–30.0)

## 2017-06-08 LAB — LDL CHOLESTEROL, DIRECT: LDL DIRECT: 105 mg/dL

## 2017-06-08 LAB — TSH: TSH: 3.28 u[IU]/mL (ref 0.35–4.50)

## 2017-06-08 MED ORDER — POTASSIUM CHLORIDE CRYS ER 10 MEQ PO TBCR: 20.0000 meq | EXTENDED_RELEASE_TABLET | Freq: Every day | ORAL | 3 refills | Status: DC

## 2017-06-08 NOTE — Patient Instructions (Addendum)
Please continue all other medications as before, and refills have been done if requested.  Please have the pharmacy call with any other refills you may need.  Please continue your efforts at being more active, low cholesterol diet, and weight control.  You are otherwise up to date with prevention measures today.  Please keep your appointments with your specialists as you may have planned  Please schedule the bone density test before leaving today at the scheduling desk (where you check out)  Please go to the LAB in the Basement (turn left off the elevator) for the tests to be done today  You will be contacted by phone if any changes need to be made immediately.  Otherwise, you will receive a letter about your results with an explanation, but please check with MyChart first.  Please remember to sign up for MyChart if you have not done so, as this will be important to you in the future with finding out test results, communicating by private email, and scheduling acute appointments online when needed.  Please return in 6 months, or sooner if needed, with Lab testing done 3-5 days before

## 2017-06-08 NOTE — Assessment & Plan Note (Signed)
Lab Results  Component Value Date   CREATININE 2.61 (H) 06/08/2017  stable, to cont f/u with renal as planned

## 2017-06-08 NOTE — Progress Notes (Signed)
Subjective:    Patient ID: [name], female    DOB: 1946-08-31, 71 y.o.   MRN: [id number]  HPI  Here for wellness and f/u;  Overall doing ok;  Pt denies Chest pain, worsening SOB, DOE, wheezing, orthopnea, PND, worsening LE edema, palpitations, dizziness or syncope.  Pt denies neurological change such as new headache, facial or extremity weakness.  Pt denies polydipsia, polyuria, or low sugar symptoms. Pt states overall good compliance with treatment and medications, good tolerability, and has been trying to follow appropriate diet.  Pt denies worsening depressive symptoms, suicidal ideation or panic. No fever, night sweats, wt loss, loss of appetite, or other constitutional symptoms.  Pt states good ability with ADL's, has low fall risk, home safety reviewed and adequate, no other significant changes in hearing or vision, and not active with exercise. Sees renal on regular basis.  C/o bilat hand numbness without pain or weakness that seems worse to first get up in the AM. Past Medical History:  Diagnosis Date  . Anemia, iron deficiency   . Arthritis   . CAD (coronary artery disease)   . Carotid stenosis    Carotid US (02/2014):  Bilateral ICA 40-59%; > 50% L ECA; F/u 1 year  . Cataract    removed both eyes  . Chronic kidney disease    chronic renal failure  . DM2 (diabetes mellitus, type 2) ([place])   . GERD (gastroesophageal reflux disease)   . Glaucoma   . HLD (hyperlipidemia)   . HTN (hypertension)   . Hx of cardiovascular stress test    Lexiscan Myoview (03/24/14):  No ischemia, EF 73%, Normal  . Mitral regurgitation   . Osteoporosis   . PUD (peptic ulcer disease)   . PVD (peripheral vascular disease) ([place])   . Shortness of breath    with exertion   Past Surgical History:  Procedure Laterality Date  . ABDOMINAL HYSTERECTOMY    . APPENDECTOMY    . AV FISTULA PLACEMENT Right 01/18/2013   Procedure: ARTERIOVENOUS (AV) FISTULA CREATION;  Surgeon: Rosetta Posner, MD;  Location:  [facility];  Service: Vascular;  Laterality: Right;  Ultrasound guided  . COLONOSCOPY    . CORONARY ANGIOPLASTY WITH STENT PLACEMENT    . RENAL ANGIOGRAM N/A 09/16/2011   Procedure: RENAL ANGIOGRAM;  Surgeon: Sherren Mocha, MD;  Location: [facility] CATH LAB;  Service: Cardiovascular;  Laterality: N/A;    reports that she quit smoking about 9 years ago. She has never used smokeless tobacco. She reports that she does not drink alcohol or use drugs. family history includes Diabetes in her sister and son; Glaucoma in her father; Heart disease in her father. No Known Allergies Current Outpatient Prescriptions on File Prior to Visit  Medication Sig Dispense Refill  . acetaminophen (TYLENOL) 500 MG tablet Take 500-1,000 mg by mouth as needed.    Marland Kitchen amLODipine (NORVASC) 10 MG tablet TAKE ONE (1) TABLET EACH DAY 90 tablet 1  . aspirin 81 MG tablet Take 81 mg by mouth daily.    Marland Kitchen atorvastatin (LIPITOR) 80 MG tablet Take 1 tablet (80 mg total) by mouth daily at 6 PM. 90 tablet 3  . BD PEN NEEDLE NANO U/F 32G X 4 MM MISC USE DAILY WITH LANTUS SOLASTAR 100 each 11  . blood glucose meter kit and supplies KIT Dispense based on patient and insurance preference. Use up to three times daily as directed. (FOR ICD-9 250.00, 250.01). 1 each 0  . Blood Glucose Monitoring Suppl (ACCU-CHEK  AVIVA PLUS) w/Device KIT USE AS DIRECTED ONCE DAILY 1 kit 0  . calcitRIOL (ROCALTROL) 0.5 MCG capsule Take 0.5 mcg by mouth daily.  6  . clopidogrel (PLAVIX) 75 MG tablet Take 75 mg by mouth daily.    . cyclobenzaprine (FLEXERIL) 10 MG tablet Take 1 tablet (10 mg total) by mouth 2 (two) times daily as needed for muscle spasms. 60 tablet 1  . ferrous sulfate 325 (65 FE) MG tablet TAKE 1 TABLET (325 MG TOTAL) BY MOUTH 2 (TWO) TIMES DAILY. 180 tablet 1  . furosemide (LASIX) 80 MG tablet Take 1 tablet (80 mg total) by mouth 2 (two) times daily. 180 tablet 3  . glucose blood test strip ACCU CHECK - Use as instructed once daily 100 each 12  .  JANUVIA 100 MG tablet TAKE 1 TABLET BY MOUTH EVERY DAY 90 tablet 3  . Lancets MISC Use as directed three times daily with meals 100 each 3  . LANTUS SOLOSTAR 100 UNIT/ML Solostar Pen INJECT 10 UNITS INTO THE SKIN DAILY 45 mL 3  . metoprolol (LOPRESSOR) 100 MG tablet TAKE 1 TABLET BY MOUTH TWICE A DAY 180 tablet 1  . nitroGLYCERIN (NITROSTAT) 0.4 MG SL tablet Place 0.4 mg under the tongue every 5 (five) minutes as needed for chest pain. Reported on 04/06/2016    . omeprazole (PRILOSEC) 20 MG capsule TAKE 1 CAPSULE (20 MG TOTAL) BY MOUTH DAILY. 90 capsule 1  . pioglitazone (ACTOS) 45 MG tablet TAKE 1 TABLET BY MOUTH EVERY DAY 90 tablet 1  . risedronate (ACTONEL) 150 MG tablet TAKE 1 TABLET BY MOUTH ONCE A MONTH-TAKE ON THE SAME DAY OF EACH MONTH. 1 tablet 5  . timolol (BETIMOL) 0.5 % ophthalmic solution Place 1 drop into both eyes daily.     . traMADol (ULTRAM) 50 MG tablet Take 1 tablet (50 mg total) by mouth every 6 (six) hours as needed. 40 tablet 1  . vitamin B-12 (CYANOCOBALAMIN) 1000 MCG tablet Take 1,000 mcg by mouth daily.    Marland Kitchen zolpidem (AMBIEN) 5 MG tablet Take 1 tablet (5 mg total) by mouth at bedtime as needed for sleep. 30 tablet 5   No current facility-administered medications on file prior to visit.    Review of Systems Constitutional: Negative for other unusual diaphoresis, sweats, appetite or weight changes HENT: Negative for other worsening hearing loss, ear pain, facial swelling, mouth sores or neck stiffness.   Eyes: Negative for other worsening pain, redness or other visual disturbance.  Respiratory: Negative for other stridor or swelling Cardiovascular: Negative for other palpitations or other chest pain  Gastrointestinal: Negative for worsening diarrhea or loose stools, blood in stool, distention or other pain Genitourinary: Negative for hematuria, flank pain or other change in urine volume.  Musculoskeletal: Negative for myalgias or other joint swelling.  Skin: Negative  for other color change, or other wound or worsening drainage.  Neurological: Negative for other syncope or numbness. Hematological: Negative for other adenopathy or swelling Psychiatric/Behavioral: Negative for hallucinations, other worsening agitation, SI, self-injury, or new decreased concentration All other system en per pt    Objective:   Physical Exam BP 124/82   Pulse 76   Temp 97.9 F (36.6 C)   Ht '5\' 1"'$  (1.549 m)   Wt 194 lb (88 kg)   SpO2 97%   BMI 36.66 kg/m  VS noted,  Constitutional: Pt is oriented to person, place, and time. Appears well-developed and well-nourished, in no significant distress and comfortable Head: Normocephalic and  atraumatic  Eyes: Conjunctivae and EOM are normal. Pupils are equal, round, and reactive to light Right Ear: External ear normal without discharge Left Ear: External ear normal without discharge Nose: Nose without discharge or deformity Mouth/Throat: Oropharynx is without other ulcerations and moist  Neck: Normal range of motion. Neck supple. No JVD present. No tracheal deviation present or significant neck LA or mass Cardiovascular: Normal rate, regular rhythm, normal heart sounds and intact distal pulses.   Pulmonary/Chest: WOB normal and breath sounds without rales or wheezing  Abdominal: Soft. Bowel sounds are normal. NT. No HSM  Musculoskeletal: Normal range of motion. Exhibits no edema Lymphadenopathy: Has no other cervical adenopathy.  Neurological: Pt is alert and oriented to person, place, and time. Pt has normal reflexes. No cranial nerve deficit. Motor grossly intact, Gait intact Skin: Skin is warm and dry. No rash noted or new ulcerations Psychiatric:  Has normal mood and affect. Behavior is normal without agitation No other exam findings     Assessment & Plan:

## 2017-06-08 NOTE — Assessment & Plan Note (Signed)
stable overall by history and exam, recent data reviewed with pt, and pt to continue medical treatment as before,  to f/u any worsening symptoms or concerns Lab Results  Component Value Date   HGBA1C 5.4 06/08/2017

## 2017-06-08 NOTE — Assessment & Plan Note (Signed)
C/w bilat mild CTS, for bilat wrist splints at night

## 2017-06-08 NOTE — Assessment & Plan Note (Signed)
stable overall by history and exam, recent data reviewed with pt, and pt to continue medical treatment as before,  to f/u any worsening symptoms or concerns BP Readings from Last 3 Encounters:  06/08/17 124/82  05/12/17 128/86  04/07/17 (!) 137/51

## 2017-06-08 NOTE — Assessment & Plan Note (Signed)

## 2017-06-09 ENCOUNTER — Telehealth: Payer: Self-pay

## 2017-06-09 LAB — HEPATITIS C ANTIBODY: HCV Ab: NONREACTIVE

## 2017-06-09 NOTE — Telephone Encounter (Signed)
-----   Message from [name], MD sent at 06/08/2017  7:10 PM EDT ----- Left message on MyChart, pt to cont same tx except  The test results show that your current treatment is OK, except the potassium is mildly low, which is most likely due to the fluid pills.  Please increase your potassium to 2 pills per day.  A new prescription will be sent, and you should hear from the office as well.    [name] to please inform pt, I will do rx

## 2017-06-09 NOTE — Telephone Encounter (Signed)
Pt has been informed and expressed understanding.  

## 2017-06-16 ENCOUNTER — Encounter (HOSPITAL_COMMUNITY)
Admission: RE | Admit: 2017-06-16 | Discharge: 2017-06-16 | Disposition: A | Discharge status: Home / Self Care | Payer: Medicare Other | Source: Ambulatory Visit | Attending: Nephrology | Admitting: Nephrology

## 2017-06-16 DIAGNOSIS — D631 Anemia in chronic kidney disease: Secondary | ICD-10-CM | POA: Insufficient documentation

## 2017-06-16 DIAGNOSIS — N184 Chronic kidney disease, stage 4 (severe): Secondary | ICD-10-CM | POA: Insufficient documentation

## 2017-06-16 DIAGNOSIS — D638 Anemia in other chronic diseases classified elsewhere: Secondary | ICD-10-CM

## 2017-06-16 LAB — POCT HEMOGLOBIN-HEMACUE: Hemoglobin: 11 g/dL — ABNORMAL LOW (ref 12.0–15.0)

## 2017-06-16 LAB — IRON AND TIBC
IRON: 48 ug/dL (ref 28–170)
Saturation Ratios: 19 % (ref 10.4–31.8)
TIBC: 249 ug/dL — ABNORMAL LOW (ref 250–450)
UIBC: 201 ug/dL

## 2017-06-16 LAB — FERRITIN: Ferritin: 817 ng/mL — ABNORMAL HIGH (ref 11–307)

## 2017-06-16 MED ORDER — EPOETIN ALFA 10000 UNIT/ML IJ SOLN
20000.0000 [IU] | INTRAMUSCULAR | Status: DC
Start: 2017-06-16 — End: 2017-06-17

## 2017-06-16 MED ORDER — EPOETIN ALFA 20000 UNIT/ML IJ SOLN
INTRAMUSCULAR | Status: AC
  Administered 2017-06-16: 20000 [IU]
  Filled 2017-06-16: qty 1

## 2017-06-27 ENCOUNTER — Encounter (HOSPITAL_COMMUNITY): Payer: Self-pay | Admitting: Emergency Medicine

## 2017-06-27 ENCOUNTER — Inpatient Hospital Stay (HOSPITAL_COMMUNITY)
Admission: EM | Admit: 2017-06-27 | Discharge: 2017-06-29 | DRG: 638 | Disposition: A | Discharge status: Home / Self Care | Payer: Medicare Other | Attending: Internal Medicine | Admitting: Internal Medicine

## 2017-06-27 DIAGNOSIS — E118 Type 2 diabetes mellitus with unspecified complications: Secondary | ICD-10-CM

## 2017-06-27 DIAGNOSIS — E162 Hypoglycemia, unspecified: Secondary | ICD-10-CM | POA: Diagnosis not present

## 2017-06-27 DIAGNOSIS — E11649 Type 2 diabetes mellitus with hypoglycemia without coma: Secondary | ICD-10-CM | POA: Diagnosis not present

## 2017-06-27 DIAGNOSIS — N185 Chronic kidney disease, stage 5: Secondary | ICD-10-CM

## 2017-06-27 DIAGNOSIS — R4182 Altered mental status, unspecified: Secondary | ICD-10-CM | POA: Diagnosis not present

## 2017-06-27 DIAGNOSIS — Z833 Family history of diabetes mellitus: Secondary | ICD-10-CM

## 2017-06-27 DIAGNOSIS — I340 Nonrheumatic mitral (valve) insufficiency: Secondary | ICD-10-CM | POA: Diagnosis not present

## 2017-06-27 DIAGNOSIS — N179 Acute kidney failure, unspecified: Secondary | ICD-10-CM

## 2017-06-27 DIAGNOSIS — E10649 Type 1 diabetes mellitus with hypoglycemia without coma: Principal | ICD-10-CM | POA: Diagnosis present

## 2017-06-27 DIAGNOSIS — E785 Hyperlipidemia, unspecified: Secondary | ICD-10-CM | POA: Diagnosis present

## 2017-06-27 DIAGNOSIS — Z7984 Long term (current) use of oral hypoglycemic drugs: Secondary | ICD-10-CM

## 2017-06-27 DIAGNOSIS — E1022 Type 1 diabetes mellitus with diabetic chronic kidney disease: Secondary | ICD-10-CM

## 2017-06-27 DIAGNOSIS — D631 Anemia in chronic kidney disease: Secondary | ICD-10-CM | POA: Diagnosis not present

## 2017-06-27 DIAGNOSIS — K219 Gastro-esophageal reflux disease without esophagitis: Secondary | ICD-10-CM | POA: Diagnosis present

## 2017-06-27 DIAGNOSIS — T68XXXA Hypothermia, initial encounter: Secondary | ICD-10-CM | POA: Diagnosis not present

## 2017-06-27 DIAGNOSIS — I132 Hypertensive heart and chronic kidney disease with heart failure and with stage 5 chronic kidney disease, or end stage renal disease: Secondary | ICD-10-CM | POA: Diagnosis present

## 2017-06-27 DIAGNOSIS — M810 Age-related osteoporosis without current pathological fracture: Secondary | ICD-10-CM | POA: Diagnosis not present

## 2017-06-27 DIAGNOSIS — G8929 Other chronic pain: Secondary | ICD-10-CM

## 2017-06-27 DIAGNOSIS — M545 Low back pain: Secondary | ICD-10-CM | POA: Diagnosis present

## 2017-06-27 DIAGNOSIS — Z955 Presence of coronary angioplasty implant and graft: Secondary | ICD-10-CM

## 2017-06-27 DIAGNOSIS — Z87891 Personal history of nicotine dependence: Secondary | ICD-10-CM | POA: Diagnosis not present

## 2017-06-27 DIAGNOSIS — I2510 Atherosclerotic heart disease of native coronary artery without angina pectoris: Secondary | ICD-10-CM | POA: Diagnosis present

## 2017-06-27 DIAGNOSIS — R400 Somnolence: Secondary | ICD-10-CM

## 2017-06-27 DIAGNOSIS — E860 Dehydration: Secondary | ICD-10-CM | POA: Diagnosis not present

## 2017-06-27 DIAGNOSIS — I5032 Chronic diastolic (congestive) heart failure: Secondary | ICD-10-CM | POA: Diagnosis present

## 2017-06-27 DIAGNOSIS — E876 Hypokalemia: Secondary | ICD-10-CM | POA: Diagnosis present

## 2017-06-27 DIAGNOSIS — Z794 Long term (current) use of insulin: Secondary | ICD-10-CM | POA: Diagnosis not present

## 2017-06-27 DIAGNOSIS — E161 Other hypoglycemia: Secondary | ICD-10-CM | POA: Diagnosis not present

## 2017-06-27 DIAGNOSIS — N189 Chronic kidney disease, unspecified: Secondary | ICD-10-CM | POA: Diagnosis not present

## 2017-06-27 DIAGNOSIS — T68XXXD Hypothermia, subsequent encounter: Secondary | ICD-10-CM | POA: Diagnosis not present

## 2017-06-27 HISTORY — DX: Hypoglycemia, unspecified: E16.2

## 2017-06-27 LAB — COMPREHENSIVE METABOLIC PANEL
ALBUMIN: 3.1 g/dL — AB (ref 3.5–5.0)
ALK PHOS: 130 U/L — AB (ref 38–126)
ALT: 15 U/L (ref 14–54)
AST: 22 U/L (ref 15–41)
Anion gap: 14 (ref 5–15)
BUN: 66 mg/dL — ABNORMAL HIGH (ref 6–20)
CALCIUM: 8.8 mg/dL — AB (ref 8.9–10.3)
CHLORIDE: 99 mmol/L — AB (ref 101–111)
CO2: 24 mmol/L (ref 22–32)
CREATININE: 3.63 mg/dL — AB (ref 0.44–1.00)
GFR calc Af Amer: 13 mL/min — ABNORMAL LOW (ref >60)
GFR calc non Af Amer: 11 mL/min — ABNORMAL LOW (ref >60)
GLUCOSE: 133 mg/dL — AB (ref 65–99)
Potassium: 3.4 mmol/L — ABNORMAL LOW (ref 3.5–5.1)
SODIUM: 137 mmol/L (ref 135–145)
Total Bilirubin: 0.9 mg/dL (ref 0.3–1.2)
Total Protein: 7 g/dL (ref 6.5–8.1)

## 2017-06-27 LAB — URINALYSIS, ROUTINE W REFLEX MICROSCOPIC
BILIRUBIN URINE: NEGATIVE
Glucose, UA: NEGATIVE mg/dL
KETONES UR: NEGATIVE mg/dL
Nitrite: NEGATIVE
PROTEIN: NEGATIVE mg/dL
Specific Gravity, Urine: 1.006 (ref 1.005–1.030)
pH: 5 (ref 5.0–8.0)

## 2017-06-27 LAB — CBC WITH DIFFERENTIAL/PLATELET
BASOS ABS: 0 10*3/uL (ref 0.0–0.1)
Basophils Relative: 0 %
Eosinophils Absolute: 0.1 10*3/uL (ref 0.0–0.7)
Eosinophils Relative: 1 %
HEMATOCRIT: 35.6 % — AB (ref 36.0–46.0)
Hemoglobin: 11.7 g/dL — ABNORMAL LOW (ref 12.0–15.0)
LYMPHS PCT: 13 %
Lymphs Abs: 1.4 10*3/uL (ref 0.7–4.0)
MCH: 27.9 pg (ref 26.0–34.0)
MCHC: 32.9 g/dL (ref 30.0–36.0)
MCV: 84.8 fL (ref 78.0–100.0)
MONO ABS: 0.4 10*3/uL (ref 0.1–1.0)
MONOS PCT: 4 %
NEUTROS ABS: 8.5 10*3/uL — AB (ref 1.7–7.7)
Neutrophils Relative %: 82 %
PLATELETS: 358 10*3/uL (ref 150–400)
RBC: 4.2 MIL/uL (ref 3.87–5.11)
RDW: 16.7 % — ABNORMAL HIGH (ref 11.5–15.5)
WBC: 10.4 10*3/uL (ref 4.0–10.5)

## 2017-06-27 LAB — BASIC METABOLIC PANEL
ANION GAP: 10 (ref 5–15)
BUN: 59 mg/dL — ABNORMAL HIGH (ref 6–20)
CALCIUM: 7.9 mg/dL — AB (ref 8.9–10.3)
CO2: 23 mmol/L (ref 22–32)
CREATININE: 3.25 mg/dL — AB (ref 0.44–1.00)
Chloride: 104 mmol/L (ref 101–111)
GFR, EST AFRICAN AMERICAN: 15 mL/min — AB (ref >60)
GFR, EST NON AFRICAN AMERICAN: 13 mL/min — AB (ref >60)
Glucose, Bld: 113 mg/dL — ABNORMAL HIGH (ref 65–99)
Potassium: 3.8 mmol/L (ref 3.5–5.1)
SODIUM: 137 mmol/L (ref 135–145)

## 2017-06-27 LAB — CBG MONITORING, ED
GLUCOSE-CAPILLARY: 128 mg/dL — AB (ref 65–99)
GLUCOSE-CAPILLARY: 139 mg/dL — AB (ref 65–99)
Glucose-Capillary: 106 mg/dL — ABNORMAL HIGH (ref 65–99)

## 2017-06-27 LAB — I-STAT CG4 LACTIC ACID, ED
LACTIC ACID, VENOUS: 1.12 mmol/L (ref 0.5–1.9)
Lactic Acid, Venous: 1.64 mmol/L (ref 0.5–1.9)

## 2017-06-27 LAB — GLUCOSE, CAPILLARY
GLUCOSE-CAPILLARY: 134 mg/dL — AB (ref 65–99)
Glucose-Capillary: 111 mg/dL — ABNORMAL HIGH (ref 65–99)
Glucose-Capillary: 176 mg/dL — ABNORMAL HIGH (ref 65–99)

## 2017-06-27 LAB — I-STAT TROPONIN, ED: TROPONIN I, POC: 0.01 ng/mL (ref 0.00–0.08)

## 2017-06-27 MED ORDER — PANTOPRAZOLE SODIUM 40 MG PO TBEC
40.0000 mg | DELAYED_RELEASE_TABLET | Freq: Every day | ORAL | Status: DC
  Administered 2017-06-27 – 2017-06-29 (×3): 40 mg via ORAL
  Filled 2017-06-27 (×3): qty 1

## 2017-06-27 MED ORDER — ACETAMINOPHEN 500 MG PO TABS
1000.0000 mg | ORAL_TABLET | Freq: Three times a day (TID) | ORAL | Status: DC | PRN
  Administered 2017-06-27: 1000 mg via ORAL
  Filled 2017-06-27: qty 2

## 2017-06-27 MED ORDER — AMLODIPINE BESYLATE 5 MG PO TABS
10.0000 mg | ORAL_TABLET | Freq: Every day | ORAL | Status: DC
  Administered 2017-06-27 – 2017-06-29 (×3): 10 mg via ORAL
  Filled 2017-06-27 (×3): qty 2

## 2017-06-27 MED ORDER — FUROSEMIDE 80 MG PO TABS
80.0000 mg | ORAL_TABLET | Freq: Two times a day (BID) | ORAL | Status: DC
  Administered 2017-06-27 – 2017-06-28 (×3): 80 mg via ORAL
  Filled 2017-06-27 (×3): qty 1

## 2017-06-27 MED ORDER — TRAMADOL HCL 50 MG PO TABS
50.0000 mg | ORAL_TABLET | Freq: Four times a day (QID) | ORAL | Status: DC | PRN
  Administered 2017-06-28: 50 mg via ORAL
  Filled 2017-06-27: qty 1

## 2017-06-27 MED ORDER — INSULIN ASPART 100 UNIT/ML ~~LOC~~ SOLN
0.0000 [IU] | Freq: Three times a day (TID) | SUBCUTANEOUS | Status: DC
  Administered 2017-06-27 – 2017-06-28 (×2): 1 [IU] via SUBCUTANEOUS
  Administered 2017-06-28: 2 [IU] via SUBCUTANEOUS

## 2017-06-27 MED ORDER — ATORVASTATIN CALCIUM 80 MG PO TABS
80.0000 mg | ORAL_TABLET | Freq: Every day | ORAL | Status: DC
  Administered 2017-06-27 – 2017-06-28 (×2): 80 mg via ORAL
  Filled 2017-06-27 (×2): qty 1

## 2017-06-27 MED ORDER — TIMOLOL MALEATE 0.5 % OP SOLN
1.0000 [drp] | Freq: Every day | OPHTHALMIC | Status: DC
  Administered 2017-06-27 – 2017-06-29 (×2): 1 [drp] via OPHTHALMIC
  Filled 2017-06-27: qty 5

## 2017-06-27 MED ORDER — POTASSIUM CHLORIDE CRYS ER 20 MEQ PO TBCR
20.0000 meq | EXTENDED_RELEASE_TABLET | Freq: Every day | ORAL | Status: DC
  Administered 2017-06-27 – 2017-06-29 (×3): 20 meq via ORAL
  Filled 2017-06-27 (×3): qty 1

## 2017-06-27 MED ORDER — ONDANSETRON HCL 4 MG/2ML IJ SOLN: 4.0000 mg | Freq: Four times a day (QID) | INTRAMUSCULAR | Status: DC | PRN

## 2017-06-27 MED ORDER — CYCLOBENZAPRINE HCL 10 MG PO TABS: 10.0000 mg | ORAL_TABLET | Freq: Two times a day (BID) | ORAL | Status: DC | PRN

## 2017-06-27 MED ORDER — METOPROLOL TARTRATE 100 MG PO TABS
100.0000 mg | ORAL_TABLET | Freq: Two times a day (BID) | ORAL | Status: DC
  Administered 2017-06-27 – 2017-06-29 (×5): 100 mg via ORAL
  Filled 2017-06-27: qty 2
  Filled 2017-06-27: qty 4
  Filled 2017-06-27 (×3): qty 1

## 2017-06-27 MED ORDER — SODIUM CHLORIDE 0.9 % IV SOLN
INTRAVENOUS | Status: DC
  Administered 2017-06-27 – 2017-06-28 (×3): via INTRAVENOUS

## 2017-06-27 MED ORDER — ONDANSETRON HCL 4 MG PO TABS: 4.0000 mg | ORAL_TABLET | Freq: Four times a day (QID) | ORAL | Status: DC | PRN

## 2017-06-27 MED ORDER — CLOPIDOGREL BISULFATE 75 MG PO TABS
75.0000 mg | ORAL_TABLET | Freq: Every day | ORAL | Status: DC
Start: 2017-06-27 — End: 2017-06-29
  Administered 2017-06-27 – 2017-06-29 (×3): 75 mg via ORAL
  Filled 2017-06-27 (×3): qty 1

## 2017-06-27 MED ORDER — SODIUM CHLORIDE 0.9 % IV BOLUS (SEPSIS)
1000.0000 mL | Freq: Once | INTRAVENOUS | Status: AC
  Administered 2017-06-27: 1000 mL via INTRAVENOUS

## 2017-06-27 MED ORDER — ZOLPIDEM TARTRATE 5 MG PO TABS: 5.0000 mg | ORAL_TABLET | Freq: Every evening | ORAL | Status: DC | PRN

## 2017-06-27 MED ORDER — ASPIRIN EC 81 MG PO TBEC
81.0000 mg | DELAYED_RELEASE_TABLET | Freq: Every day | ORAL | Status: DC
  Administered 2017-06-27 – 2017-06-29 (×3): 81 mg via ORAL
  Filled 2017-06-27 (×3): qty 1

## 2017-06-27 MED ORDER — HEPARIN SODIUM (PORCINE) 5000 UNIT/ML IJ SOLN
5000.0000 [IU] | Freq: Three times a day (TID) | INTRAMUSCULAR | Status: DC
  Administered 2017-06-27 – 2017-06-29 (×6): 5000 [IU] via SUBCUTANEOUS
  Filled 2017-06-27 (×6): qty 1

## 2017-06-27 NOTE — ED Notes (Signed)
Attempted to draw back blood from IV without success.   Attempted to butterfly stick without success.

## 2017-06-27 NOTE — ED Notes (Signed)
Warm blankets applied

## 2017-06-27 NOTE — Progress Notes (Signed)
     Cr remains elevated. Hypothermia resolved. Pt overall condition improving but will need to stay overnight   [name], MD [place] 06/27/2017, 5:59 PM

## 2017-06-27 NOTE — ED Provider Notes (Signed)
[facility] DEPT Provider Note   CSN: [id number] Arrival date & time: 06/27/17  0508     History   Chief Complaint Chief Complaint  Patient presents with  . Hypoglycemia    HPI [name] is a 71 y.o. female.  HPI  This is a 71 year old female who presents by EMS for hypoglycemia and decreased level of consciousness. Her EMS report, patient was found by her roommate with decreased level of consciousness. Patient reports that she took her nightly insulin, 12 units of Lantus last night as usual. She also reports taking Flexeril, Ultram and Ambien. She states that she took these as prescribed. Upon EMS arrival, she was found to have a blood sugar of 44. She was given D10 with marked improvement of her mental status. Patient has no recollection of events. She reports no recent illnesses. She is currently without complaint. She reports that she was eating and drinking normally yesterday.  Past Medical History:  Diagnosis Date  . Anemia, iron deficiency   . Arthritis   . CAD (coronary artery disease)   . Carotid stenosis    Carotid US (02/2014):  Bilateral ICA 40-59%; > 50% L ECA; F/u 1 year  . Cataract    removed both eyes  . Chronic kidney disease    chronic renal failure  . DM2 (diabetes mellitus, type 2) ([place])   . GERD (gastroesophageal reflux disease)   . Glaucoma   . HLD (hyperlipidemia)   . HTN (hypertension)   . Hx of cardiovascular stress test    Lexiscan Myoview (03/24/14):  No ischemia, EF 73%, Normal  . Mitral regurgitation   . Osteoporosis   . PUD (peptic ulcer disease)   . PVD (peripheral vascular disease) ([place])   . Shortness of breath    with exertion    Patient Active Problem List   Diagnosis Date Noted  . Bilateral hand numbness 06/08/2017  . Low back pain 03/09/2017  . Insomnia 12/27/2016  . Anemia of chronic disease 05/19/2016  . Second degree hemorrhoids 04/10/2016  . Aftercare following surgery of the circulatory system, [place] 03/26/2013  .  Pain in joint, ankle and foot 03/13/2013  . End stage renal disease ([place]) 01/15/2013  . Bilateral foot pain 09/07/2012  . Sciatica of left side 03/10/2012  . 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 Renal artery stenosis ([place]) 04/25/2011  . CKD (chronic kidney disease) 04/12/2011  . Preventative health care 04/12/2011  . PERIPHERAL EDEMA 04/01/2010  . VITAMIN B12 DEFICIENCY 10/01/2009  . Nocturia 02/10/2009  . FATIGUE 12/18/2008  . Pain in joint, shoulder region 06/17/2008  . CERVICAL RADICULOPATHY 06/17/2008  . ANEMIA-IRON DEFICIENCY 12/13/2007  . PERIPHERAL VASCULAR DISEASE 12/13/2007  . Diabetes ([place]) 06/11/2007  . Hyperlipidemia 06/11/2007  . GLAUCOMA NOS 06/11/2007  . MITRAL REGURGITATION, 2 PLUS 06/11/2007  . Essential hypertension 06/11/2007  . CORONARY ARTERY DISEASE 06/11/2007  . CAROTID ARTERY STENOSIS 06/11/2007  . GERD 06/11/2007  . Peptic Ulcer, Site NOS 06/11/2007  . OSTEOPOROSIS 06/11/2007    Past Surgical History:  Procedure Laterality Date  . ABDOMINAL HYSTERECTOMY    . APPENDECTOMY    . AV FISTULA PLACEMENT Right 01/18/2013   Procedure: ARTERIOVENOUS (AV) FISTULA CREATION;  Surgeon: Rosetta Posner, MD;  Location: [facility];  Service: Vascular;  Laterality: Right;  Ultrasound guided  . COLONOSCOPY    . CORONARY ANGIOPLASTY WITH STENT PLACEMENT    . RENAL ANGIOGRAM N/A 09/16/2011   Procedure: RENAL ANGIOGRAM;  Surgeon: Sherren Mocha, MD;  Location: [facility] CATH LAB;  Service: Cardiovascular;  Laterality: N/A;    OB History    No data available       Home Medications    Prior to Admission medications   Medication Sig Start Date End Date Taking? Authorizing Provider  acetaminophen (TYLENOL) 500 MG tablet Take 500-1,000 mg by mouth as needed.    [provider]  amLODipine (NORVASC) 10 MG tablet TAKE ONE (1) TABLET EACH DAY 04/10/17   [name], MD  aspirin 81 MG tablet Take 81 mg by mouth daily.    [provider]  atorvastatin  (LIPITOR) 80 MG tablet Take 1 tablet (80 mg total) by mouth daily at 6 PM. 03/10/17   [name], MD  BD PEN NEEDLE NANO U/F 32G X 4 MM MISC USE DAILY WITH LANTUS SOLASTAR 05/20/14   [name], MD  blood glucose meter kit and supplies KIT Dispense based on patient and insurance preference. Use up to three times daily as directed. (FOR ICD-9 250.00, 250.01). 04/06/16   [name], MD  Blood Glucose Monitoring Suppl (ACCU-CHEK AVIVA PLUS) w/Device KIT USE AS DIRECTED ONCE DAILY 05/01/17   [name], MD  calcitRIOL (ROCALTROL) 0.5 MCG capsule Take 0.5 mcg by mouth daily. 02/29/16   [provider]  clopidogrel (PLAVIX) 75 MG tablet Take 75 mg by mouth daily.    [provider]  cyclobenzaprine (FLEXERIL) 10 MG tablet Take 1 tablet (10 mg total) by mouth 2 (two) times daily as needed for muscle spasms. 03/09/17   [name], MD  ferrous sulfate 325 (65 FE) MG tablet TAKE 1 TABLET (325 MG TOTAL) BY MOUTH 2 (TWO) TIMES DAILY. 12/27/16   [name], MD  furosemide (LASIX) 80 MG tablet Take 1 tablet (80 mg total) by mouth 2 (two) times daily. 09/07/12   [name], MD  glucose blood test strip [facility] - Use as instructed once daily 03/09/17   [name], MD  JANUVIA 100 MG tablet TAKE 1 TABLET BY MOUTH EVERY DAY 04/14/17   [name], MD  Lancets MISC Use as directed three times daily with meals 03/09/17   [name], MD  LANTUS SOLOSTAR 100 UNIT/ML Solostar Pen INJECT 10 UNITS INTO THE SKIN DAILY 12/12/16   [name], MD  metoprolol (LOPRESSOR) 100 MG tablet TAKE 1 TABLET BY MOUTH TWICE A DAY 02/27/17   [name], MD  nitroGLYCERIN (NITROSTAT) 0.4 MG SL tablet Place 0.4 mg under the tongue every 5 (five) minutes as needed for chest pain. Reported on 04/06/2016    [provider]  omeprazole (PRILOSEC) 20 MG capsule TAKE 1 CAPSULE (20 MG TOTAL) BY MOUTH DAILY. 04/10/17   [name], MD  pioglitazone (ACTOS) 45 MG tablet TAKE 1 TABLET BY MOUTH EVERY DAY  02/27/17   [name], MD  potassium chloride (K-DUR,KLOR-CON) 10 MEQ tablet Take 2 tablets (20 mEq total) by mouth daily. 06/08/17   [name], MD  risedronate (ACTONEL) 150 MG tablet TAKE 1 TABLET BY MOUTH ONCE A MONTH-TAKE ON THE SAME DAY OF EACH MONTH. 04/10/17   [name], MD  timolol (BETIMOL) 0.5 % ophthalmic solution Place 1 drop into both eyes daily.     [provider]  traMADol (ULTRAM) 50 MG tablet Take 1 tablet (50 mg total) by mouth every 6 (six) hours as needed. 03/09/17   [name], MD  vitamin B-12 (  CYANOCOBALAMIN) 1000 MCG tablet Take 1,000 mcg by mouth daily.    [provider]  zolpidem (AMBIEN) 5 MG tablet Take 1 tablet (5 mg total) by mouth at bedtime as needed for sleep. 12/27/16   [name], MD    Family History Family History  Problem Relation Age of Onset  . Heart disease Father   . Glaucoma Father   . Diabetes Sister   . Diabetes Son   . Colon cancer Neg Hx   . Colon polyps Neg Hx   . Esophageal cancer Neg Hx   . Rectal cancer Neg Hx   . Stomach cancer Neg Hx     Social History Social History  Substance Use Topics  . Smoking status: Former Smoker    Quit date: 10/25/2007  . Smokeless tobacco: Never Used     Comment: Stopped 1.5 yrs ago January 2009  . Alcohol use No     Allergies   Patient has no known allergies.   Review of Systems Review of Systems  Constitutional: Negative for fever.  Respiratory: Negative for shortness of breath.   Cardiovascular: Negative for chest pain.  Gastrointestinal: Negative for abdominal pain, diarrhea, nausea and vomiting.  Neurological: Negative for dizziness.       Altered mental status  All other systems reviewed and are negative.    Physical Exam Updated Vital Signs BP 111/61 (BP Location: Left Arm)   Pulse 61   Resp 17   Ht 5' 1" (1.549 m)   Wt 88 kg (194 lb)   SpO2 100%   BMI 36.66 kg/m   Physical Exam  Constitutional: She is oriented to person, place, and time. No  distress.  Obese  HENT:  Head: Normocephalic and atraumatic.  Eyes: Pupils are equal, round, and reactive to light.  Pupils 3 mm reactive bilaterally  Cardiovascular: Normal rate, regular rhythm and normal heart sounds.   Pulmonary/Chest: Effort normal and breath sounds normal. No respiratory distress. She has no wheezes.  Abdominal: Soft. Bowel sounds are normal. There is no tenderness. There is no guarding.  Neurological: She is alert and oriented to person, place, and time.  Facial symmetry noted, normal speech pattern, 5 out of 5 strength in all 4 extremities  Skin: Skin is warm and dry.  Psychiatric: She has a normal mood and affect.  Nursing note and vitals reviewed.    ED Treatments / Results  Labs (all labs ordered are listed, but only abnormal results are displayed) Labs Reviewed  CBC WITH DIFFERENTIAL/PLATELET - Abnormal; Notable for the following:       Result Value   Hemoglobin 11.7 (*)    HCT 35.6 (*)    RDW 16.7 (*)    Neutro Abs 8.5 (*)    All other components within normal limits  COMPREHENSIVE METABOLIC PANEL - Abnormal; Notable for the following:    Potassium 3.4 (*)    Chloride 99 (*)    Glucose, Bld 133 (*)    BUN 66 (*)    Creatinine, Ser 3.63 (*)    Calcium 8.8 (*)    Albumin 3.1 (*)    Alkaline Phosphatase 130 (*)    GFR calc non Af Amer 11 (*)    GFR calc Af Amer 13 (*)    All other components within normal limits  URINALYSIS, ROUTINE W REFLEX MICROSCOPIC - Abnormal; Notable for the following:    Color, Urine STRAW (*)    Hgb urine dipstick SMALL (*)    Leukocytes, UA  SMALL (*)    Bacteria, UA RARE (*)    Squamous Epithelial / LPF 0-5 (*)    All other components within normal limits  CBG MONITORING, ED - Abnormal; Notable for the following:    Glucose-Capillary 128 (*)    All other components within normal limits  CBG MONITORING, ED - Abnormal; Notable for the following:    Glucose-Capillary 106 (*)    All other components within normal  limits  I-STAT CG4 LACTIC ACID, ED  I-STAT TROPONIN, ED    EKG  EKG Interpretation  Date/Time:  Tuesday June 27 2017 05:25:15 EDT Ventricular Rate:  64 PR Interval:    QRS Duration: 98 QT Interval:  463 QTC Calculation: 478 R Axis:   55 Text Interpretation:  Sinus rhythm Anteroseptal infarct, age indeterminate Confirmed by Thayer Jew [phone]) on 06/27/2017 6:03:58 AM       Radiology No results found.  Procedures Procedures (including critical care time)  Medications Ordered in ED Medications  sodium chloride 0.9 % bolus 1,000 mL (1,000 mLs Intravenous New Bag/Given 06/27/17 0388)     Initial Impression / Assessment and Plan / ED Course  I have reviewed the triage vital signs and the nursing notes.  Pertinent labs & imaging results that were available during my care of the patient were reviewed by me and considered in my medical decision making (see chart for details).     Patient presents with hyperglycemia. She was given glucose by EMS with normalization of her exam. She is nontoxic on exam. Vital signs notable for temperature of 93.3. Patient denies any other complaints at this time. She reports taking her normal insulin last night and reports normal by mouth intake. Repeat blood sugar 128. Basic labwork obtained. EKG is nonischemic. Patient was placed under a bear hugger. Initial lab work notable for elevated creatinine to 3.6. Baseline 2.6. Normal potassium. No significant leukocytosis. No evidence of urinary tract infection. On recheck, patient remains asymptomatic. Suspect that hypoglycemia may be related to acute kidney injury causing patient to more slowly metabolize insulin. Patient was given fluid bolus. Given acute kidney injury and hypoglycemia, will admit for observation for frequent glucose checks and repeat BMP.    Final Clinical Impressions(s) / ED Diagnoses   Final diagnoses:  Hypoglycemia  AKI (acute kidney injury) ([place])    New  Prescriptions New Prescriptions   No medications on file     Merryl Hacker, MD 06/27/17 0700

## 2017-06-27 NOTE — ED Notes (Signed)
Pt taken off Bair hugger

## 2017-06-27 NOTE — Care Management Obs Status (Signed)
[facility] NOTIFICATION   Patient Details  Name: [name] MRN: [id number] Date of Birth: 06-27-1946   Medicare Observation Status Notification Given:  Yes    Carles Collet, RN 06/27/2017, 2:07 PM

## 2017-06-27 NOTE — ED Triage Notes (Signed)
BIB [facility] for unresponsiveness.  Last seen normal 2200.  Took nighttime meds and went to bed.  Took flexeril, ultram, (both old prescriptions, both empty), and ambien.  Pt's initial CBG was 44.  EMS gave 250 cc D10.  CBG increased to 196.  VSS ffor EMS.  Pt is legally blind.  Pt sts she feels better and has no complaints.  Pt transferred to [facility] in room and pull out IV by accident.

## 2017-06-27 NOTE — ED Notes (Signed)
Pt eating breakfast 

## 2017-06-27 NOTE — H&P (Addendum)
History and Physical    [name] DOB: 08/24/1946 DOA: 06/27/2017  PCP: [name], MD Patient coming from: Home  Chief Complaint: somnolence  HPI: [name] is a 71 y.o. female with medical history significant of CAD, cataracts, DM, GERD/PUD, HLD, HTN, PVD.   Patient presenting after roommate found patient in a somnolent state. Unable to arouse patient so EMS was called. Patient reports compliance with her typical insulin regimen of 12 units of Lantus daily at bedtime. No recent medication changes. EMS was called to evaluate patient and she was noted at that time to have a glucose of 44. D10 administered with marked improvement in mental status. Patient denies any recent chest pain, palpitations, shortness of breath, abdominal pain, nausea, vomiting, dysuria, frequency, neck stiffness, headache, dizziness, vertigo.  ED Course: placed on bear hugger, objective findings outlined below. Glucose above 100 throughout time in ED  Review of Systems: As per HPI otherwise all other systems reviewed and are negative  Ambulatory Status: unknown  Past Medical History:  Diagnosis Date  . Anemia, iron deficiency   . Arthritis   . CAD (coronary artery disease)   . Carotid stenosis    Carotid US (02/2014):  Bilateral ICA 40-59%; > 50% L ECA; F/u 1 year  . Cataract    removed both eyes  . Chronic kidney disease    chronic renal failure  . DM2 (diabetes mellitus, type 2) ([place])   . GERD (gastroesophageal reflux disease)   . Glaucoma   . HLD (hyperlipidemia)   . HTN (hypertension)   . Hx of cardiovascular stress test    Lexiscan Myoview (03/24/14):  No ischemia, EF 73%, Normal  . Mitral regurgitation   . Osteoporosis   . PUD (peptic ulcer disease)   . PVD (peripheral vascular disease) ([place])   . Shortness of breath    with exertion    Past Surgical History:  Procedure Laterality Date  . ABDOMINAL HYSTERECTOMY    . APPENDECTOMY    . AV FISTULA PLACEMENT  Right 01/18/2013   Procedure: ARTERIOVENOUS (AV) FISTULA CREATION;  Surgeon: Rosetta Posner, MD;  Location: [facility];  Service: Vascular;  Laterality: Right;  Ultrasound guided  . COLONOSCOPY    . CORONARY ANGIOPLASTY WITH STENT PLACEMENT    . RENAL ANGIOGRAM N/A 09/16/2011   Procedure: RENAL ANGIOGRAM;  Surgeon: Sherren Mocha, MD;  Location: [facility] CATH LAB;  Service: Cardiovascular;  Laterality: N/A;    Social History   Social History  . Marital status: Single    Spouse name: N/A  . Number of children: 3  . Years of education: N/A   Occupational History  . homemaker    Social History Main Topics  . Smoking status: Former Smoker    Quit date: 10/25/2007  . Smokeless tobacco: Never Used     Comment: Stopped 1.5 yrs ago January 2009  . Alcohol use No  . Drug use: No  . Sexual activity: No   Other Topics Concern  . Not on file   Social History Narrative  . No narrative on file    No Known Allergies  Family History  Problem Relation Age of Onset  . Heart disease Father   . Glaucoma Father   . Diabetes Sister   . Diabetes Son   . Colon cancer Neg Hx   . Colon polyps Neg Hx   . Esophageal cancer Neg Hx   . Rectal cancer Neg Hx   . Stomach cancer Neg Hx  Prior to Admission medications   Medication Sig Start Date End Date Taking? Authorizing Provider  aspirin 81 MG tablet Take 81 mg by mouth daily.   Yes [provider]  acetaminophen (TYLENOL) 500 MG tablet Take 500-1,000 mg by mouth as needed.    [provider]  amLODipine (NORVASC) 10 MG tablet TAKE ONE (1) TABLET EACH DAY 04/10/17   [name], MD  atorvastatin (LIPITOR) 80 MG tablet Take 1 tablet (80 mg total) by mouth daily at 6 PM. 03/10/17   [name], MD  BD PEN NEEDLE NANO U/F 32G X 4 MM MISC USE DAILY WITH LANTUS SOLASTAR 05/20/14   [name], MD  blood glucose meter kit and supplies KIT Dispense based on patient and insurance preference. Use up to three times daily as directed. (FOR  ICD-9 250.00, 250.01). 04/06/16   [name], MD  Blood Glucose Monitoring Suppl (ACCU-CHEK AVIVA PLUS) w/Device KIT USE AS DIRECTED ONCE DAILY 05/01/17   [name], MD  calcitRIOL (ROCALTROL) 0.5 MCG capsule Take 0.5 mcg by mouth daily. 02/29/16   [provider]  clopidogrel (PLAVIX) 75 MG tablet Take 75 mg by mouth daily.    [provider]  cyclobenzaprine (FLEXERIL) 10 MG tablet Take 1 tablet (10 mg total) by mouth 2 (two) times daily as needed for muscle spasms. 03/09/17   [name], MD  ferrous sulfate 325 (65 FE) MG tablet TAKE 1 TABLET (325 MG TOTAL) BY MOUTH 2 (TWO) TIMES DAILY. 12/27/16   [name], MD  furosemide (LASIX) 80 MG tablet Take 1 tablet (80 mg total) by mouth 2 (two) times daily. 09/07/12   [name], MD  glucose blood test strip [facility] - Use as instructed once daily 03/09/17   [name], MD  JANUVIA 100 MG tablet TAKE 1 TABLET BY MOUTH EVERY DAY 04/14/17   [name], MD  Lancets MISC Use as directed three times daily with meals 03/09/17   [name], MD  LANTUS SOLOSTAR 100 UNIT/ML Solostar Pen INJECT 10 UNITS INTO THE SKIN DAILY 12/12/16   [name], MD  metoprolol (LOPRESSOR) 100 MG tablet TAKE 1 TABLET BY MOUTH TWICE A DAY 02/27/17   [name], MD  nitroGLYCERIN (NITROSTAT) 0.4 MG SL tablet Place 0.4 mg under the tongue every 5 (five) minutes as needed for chest pain. Reported on 04/06/2016    [provider]  omeprazole (PRILOSEC) 20 MG capsule TAKE 1 CAPSULE (20 MG TOTAL) BY MOUTH DAILY. 04/10/17   [name], MD  pioglitazone (ACTOS) 45 MG tablet TAKE 1 TABLET BY MOUTH EVERY DAY 02/27/17   [name], MD  potassium chloride (K-DUR,KLOR-CON) 10 MEQ tablet Take 2 tablets (20 mEq total) by mouth daily. 06/08/17   [name], MD  risedronate (ACTONEL) 150 MG tablet TAKE 1 TABLET BY MOUTH ONCE A MONTH-TAKE ON THE SAME DAY OF EACH MONTH. 04/10/17   [name], MD  timolol (BETIMOL) 0.5 % ophthalmic solution Place 1  drop into both eyes daily.     [provider]  traMADol (ULTRAM) 50 MG tablet Take 1 tablet (50 mg total) by mouth every 6 (six) hours as needed. 03/09/17   [name], MD  vitamin B-12 (CYANOCOBALAMIN) 1000 MCG tablet Take 1,000 mcg by mouth daily.    [provider]  zolpidem (AMBIEN) 5 MG tablet Take 1 tablet (5 mg total) by mouth at bedtime  as needed for sleep. 12/27/16   [name], MD    Physical Exam: Vitals:   06/27/17 0700 06/27/17 0715 06/27/17 0730 06/27/17 0745  BP: (!) 114/56 (!) 112/94 128/80 (!) 125/56  Pulse: 63 65 74 68  Resp: 19 (!) '25 17 15  '$ Temp:      TempSrc:      SpO2: 100% 96% 97% 98%  Weight:      Height:         General:  Appears calm and comfortable Eyes:  EOMI, normal lids, iris ENT:  grossly normal hearing, lips & tongue, mmm Neck:  no LAD, masses or thyromegaly Cardiovascular:  RRR, III/VI systolic. No LE edema.  Respiratory:  CTA bilaterally, no w/r/r. Normal respiratory effort. Abdomen:  soft, ntnd, NABS Skin:  no rash or induration seen on limited exam Musculoskeletal:  grossly normal tone BUE/BLE, good ROM, no bony abnormality Psychiatric:  grossly normal mood and affect, speech fluent and appropriate, AOx3 Neurologic:  CN 2-12 grossly intact, moves all extremities in coordinated fashion, sensation intact  Labs on Admission: I have personally reviewed following labs and imaging studies  CBC:  Recent Labs Lab 06/27/17 0530  WBC 10.4  NEUTROABS 8.5*  HGB 11.7*  HCT 35.6*  MCV 84.8  PLT 409   Basic Metabolic Panel:  Recent Labs Lab 06/27/17 0530  NA 137  K 3.4*  CL 99*  CO2 24  GLUCOSE 133*  BUN 66*  CREATININE 3.63*  CALCIUM 8.8*   GFR: Estimated Creatinine Clearance: 13.9 mL/min (A) (by C-G formula based on SCr of 3.63 mg/dL (H)). Liver Function Tests:  Recent Labs Lab 06/27/17 0530  AST 22  ALT 15  ALKPHOS 130*  BILITOT 0.9  PROT 7.0  ALBUMIN 3.1*   No results for input(s): LIPASE, AMYLASE  in the last 168 hours. No results for input(s): AMMONIA in the last 168 hours. Coagulation Profile: No results for input(s): INR, PROTIME in the last 168 hours. Cardiac Enzymes: No results for input(s): CKTOTAL, CKMB, CKMBINDEX, TROPONINI in the last 168 hours. BNP (last 3 results) No results for input(s): PROBNP in the last 8760 hours. HbA1C: No results for input(s): HGBA1C in the last 72 hours. CBG:  Recent Labs Lab 06/27/17 0520 06/27/17 0651  GLUCAP 128* 106*   Lipid Profile: No results for input(s): CHOL, HDL, LDLCALC, TRIG, CHOLHDL, LDLDIRECT in the last 72 hours. Thyroid Function Tests: No results for input(s): TSH, T4TOTAL, FREET4, T3FREE, THYROIDAB in the last 72 hours. Anemia Panel: No results for input(s): VITAMINB12, FOLATE, FERRITIN, TIBC, IRON, RETICCTPCT in the last 72 hours. Urine analysis:    Component Value Date/Time   COLORURINE STRAW (A) 06/27/2017 0620   APPEARANCEUR CLEAR 06/27/2017 0620   LABSPEC 1.006 06/27/2017 0620   PHURINE 5.0 06/27/2017 0620   GLUCOSEU NEGATIVE 06/27/2017 0620   GLUCOSEU NEGATIVE 06/08/2017 1553   HGBUR SMALL (A) 06/27/2017 0620   BILIRUBINUR NEGATIVE 06/27/2017 0620   KETONESUR NEGATIVE 06/27/2017 0620   PROTEINUR NEGATIVE 06/27/2017 0620   UROBILINOGEN 0.2 06/08/2017 1553   NITRITE NEGATIVE 06/27/2017 0620   LEUKOCYTESUR SMALL (A) 06/27/2017 0620    Creatinine Clearance: Estimated Creatinine Clearance: 13.9 mL/min (A) (by C-G formula based on SCr of 3.63 mg/dL (H)).  Sepsis Labs: '@LABRCNTIP'$ (procalcitonin:4,lacticidven:4) )No results found for this or any previous visit (from the past 240 hour(s)).   Radiological Exams on Admission: No results found.  EKG: Independently reviewed. 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 Glucose 44 in the field. Improvement after D50. Glucose 130 on arrival.  - CBG Q2 x2 then Q4 - Encourage PO  Somnolence: likely secondary to hypoglycemia. Resolved after D50 given by EMS w/ return of nml glucose level. Pt w/ other sedating medications as part of home regimen but no recent changes.  - Monitor  Hypothermia: secondary to hypoglycemia. No evidence of infectious process or intracranial process that would affect temperature. Improving w/ bear hugger and warm saline and improvement in glucose - Continue warm saline and bear hugger - VS per floor routine  AoCKD: Not on dialysis but has a R AV fistula. Cr 3.6 on arrival w/ baseline 2.6. This may be the new norm for pt vs simple AKI from dehydration or just intermittent/temporary worsening of renal function. 1L NS bolus in ED - NS 75cc/hr - BMP at 16:00 and in am if needed  DM: - SSI - Pt and daughter informed that she will likely need to reduce her home dose of Lantus and sliding scale insulin if her renal function does not improve.   CAD: - continue Statin, ASA., Plavix  HTN: - continue Lopressor, Norvasc, Lasix  Chronic diastolic CHF: Echo from 9249 showing EF 55% and grade 1 diastolic dysfunction. No decompensation  - Contineu lasix  Chronic pain: - Continue Tramadol, flexeril  GERD: - continue PPI   DVT prophylaxis: hep  Code Status: full  Family Communication: daughter  Disposition Plan: pending normalization of renal function and resolution of hypoglycemia and hypothermia  Consults called: none  Admission status: observation - Tele    Jozef Eisenbeis J MD [facility]  If 7PM-7AM, please contact night-coverage [email or web address] Password [email or web address]  06/27/2017, 8:07 AM

## 2017-06-28 DIAGNOSIS — Z794 Long term (current) use of insulin: Secondary | ICD-10-CM

## 2017-06-28 DIAGNOSIS — N189 Chronic kidney disease, unspecified: Secondary | ICD-10-CM

## 2017-06-28 DIAGNOSIS — T68XXXD Hypothermia, subsequent encounter: Secondary | ICD-10-CM | POA: Diagnosis not present

## 2017-06-28 DIAGNOSIS — E11649 Type 2 diabetes mellitus with hypoglycemia without coma: Secondary | ICD-10-CM | POA: Diagnosis not present

## 2017-06-28 DIAGNOSIS — N179 Acute kidney failure, unspecified: Secondary | ICD-10-CM

## 2017-06-28 DIAGNOSIS — Z955 Presence of coronary angioplasty implant and graft: Secondary | ICD-10-CM | POA: Diagnosis not present

## 2017-06-28 DIAGNOSIS — E785 Hyperlipidemia, unspecified: Secondary | ICD-10-CM | POA: Diagnosis present

## 2017-06-28 DIAGNOSIS — I340 Nonrheumatic mitral (valve) insufficiency: Secondary | ICD-10-CM | POA: Diagnosis present

## 2017-06-28 DIAGNOSIS — I132 Hypertensive heart and chronic kidney disease with heart failure and with stage 5 chronic kidney disease, or end stage renal disease: Secondary | ICD-10-CM | POA: Diagnosis present

## 2017-06-28 DIAGNOSIS — K219 Gastro-esophageal reflux disease without esophagitis: Secondary | ICD-10-CM | POA: Diagnosis present

## 2017-06-28 DIAGNOSIS — E1022 Type 1 diabetes mellitus with diabetic chronic kidney disease: Secondary | ICD-10-CM | POA: Diagnosis present

## 2017-06-28 DIAGNOSIS — M810 Age-related osteoporosis without current pathological fracture: Secondary | ICD-10-CM | POA: Diagnosis present

## 2017-06-28 DIAGNOSIS — E10649 Type 1 diabetes mellitus with hypoglycemia without coma: Secondary | ICD-10-CM | POA: Diagnosis present

## 2017-06-28 DIAGNOSIS — G8929 Other chronic pain: Secondary | ICD-10-CM | POA: Diagnosis not present

## 2017-06-28 DIAGNOSIS — I2510 Atherosclerotic heart disease of native coronary artery without angina pectoris: Secondary | ICD-10-CM | POA: Diagnosis present

## 2017-06-28 DIAGNOSIS — E860 Dehydration: Secondary | ICD-10-CM | POA: Diagnosis present

## 2017-06-28 DIAGNOSIS — E876 Hypokalemia: Secondary | ICD-10-CM | POA: Diagnosis present

## 2017-06-28 DIAGNOSIS — N185 Chronic kidney disease, stage 5: Secondary | ICD-10-CM | POA: Diagnosis present

## 2017-06-28 DIAGNOSIS — M545 Low back pain: Secondary | ICD-10-CM | POA: Diagnosis present

## 2017-06-28 DIAGNOSIS — T68XXXA Hypothermia, initial encounter: Secondary | ICD-10-CM | POA: Diagnosis present

## 2017-06-28 DIAGNOSIS — I5032 Chronic diastolic (congestive) heart failure: Secondary | ICD-10-CM | POA: Diagnosis present

## 2017-06-28 DIAGNOSIS — R4182 Altered mental status, unspecified: Secondary | ICD-10-CM

## 2017-06-28 DIAGNOSIS — D631 Anemia in chronic kidney disease: Secondary | ICD-10-CM | POA: Diagnosis present

## 2017-06-28 DIAGNOSIS — Z7984 Long term (current) use of oral hypoglycemic drugs: Secondary | ICD-10-CM | POA: Diagnosis not present

## 2017-06-28 DIAGNOSIS — Z833 Family history of diabetes mellitus: Secondary | ICD-10-CM | POA: Diagnosis not present

## 2017-06-28 DIAGNOSIS — Z87891 Personal history of nicotine dependence: Secondary | ICD-10-CM | POA: Diagnosis not present

## 2017-06-28 LAB — BASIC METABOLIC PANEL
ANION GAP: 10 (ref 5–15)
BUN: 57 mg/dL — ABNORMAL HIGH (ref 6–20)
CHLORIDE: 108 mmol/L (ref 101–111)
CO2: 23 mmol/L (ref 22–32)
CREATININE: 3.13 mg/dL — AB (ref 0.44–1.00)
Calcium: 7.9 mg/dL — ABNORMAL LOW (ref 8.9–10.3)
GFR calc non Af Amer: 14 mL/min — ABNORMAL LOW (ref >60)
GFR, EST AFRICAN AMERICAN: 16 mL/min — AB (ref >60)
Glucose, Bld: 82 mg/dL (ref 65–99)
POTASSIUM: 3.6 mmol/L (ref 3.5–5.1)
SODIUM: 141 mmol/L (ref 135–145)

## 2017-06-28 LAB — GLUCOSE, CAPILLARY
GLUCOSE-CAPILLARY: 101 mg/dL — AB (ref 65–99)
GLUCOSE-CAPILLARY: 125 mg/dL — AB (ref 65–99)
GLUCOSE-CAPILLARY: 169 mg/dL — AB (ref 65–99)
Glucose-Capillary: 129 mg/dL — ABNORMAL HIGH (ref 65–99)

## 2017-06-28 MED ORDER — SODIUM CHLORIDE 0.9 % IV SOLN
INTRAVENOUS | Status: AC
  Administered 2017-06-28: 16:00:00 via INTRAVENOUS

## 2017-06-28 NOTE — Evaluation (Signed)
Physical Therapy Evaluation Patient Details Name: [name] MRN: [id number] DOB: 29-Oct-1945 Today's Date: 06/28/2017   History of Present Illness  Pt is a 71 y/o female admitted secondary to hypoglycemia and unresponsiveness. PMH includes DM, CKD, CAD s/p stent placement, PVD, HTN, osteoporosis, and s/p R AV fistula placement.   Clinical Impression  Pt admitted secondary to problem above with deficits below. PTA, pt was independent with mobility using a cane. Upon eval, pt limited by decreased balance, weakness, and slight dizziness which improved with seated rest. Pt required min to min guard assist. Improved balance noted with use of RW. Recommend DME below and HHPT to increase safety and independence with mobility at home. Pt reports she has roommate and family that can be with her 24/7. Will continue to follow acutely to maximize functional mobility independence and safety.     Follow Up Recommendations Home health PT;Supervision/Assistance - 24 hour    Equipment Recommendations  Rolling walker with 5" wheels;3in1 (PT)    Recommendations for Other Services       Precautions / Restrictions Precautions Precautions: Fall Restrictions Weight Bearing Restrictions: No      Mobility  Bed Mobility               General bed mobility comments: In chair upon entry   Transfers Overall transfer level: Needs assistance Equipment used: None;Rolling walker (2 wheeled) Transfers: Sit to/from Stand Sit to Stand: Min guard;Min assist         General transfer comment: Min A for balance when standing without AD. When using AD increased steadiness and required min guard for safety. Verbal cues for safe hand placement.   Ambulation/Gait Ambulation/Gait assistance: Min guard;Supervision Ambulation Distance (Feet): 50 Feet Assistive device: Rolling walker (2 wheeled) Gait Pattern/deviations: Step-through pattern;Decreased stride length;Trunk flexed Gait velocity:  Decreased Gait velocity interpretation: Below normal speed for age/gender General Gait Details: Slow, cautious gait secondary to back pain. Pt reports increased steadiness with use of RW. Distance limited secondary to back pain. Verbal cues for sequencing with RW.  Stairs            Wheelchair Mobility    Modified Rankin (Stroke Patients Only)       Balance Overall balance assessment: Needs assistance Sitting-balance support: No upper extremity supported;Feet supported Sitting balance-Leahy Scale: Good     Standing balance support: Bilateral upper extremity supported;During functional activity;No upper extremity supported Standing balance-Leahy Scale: Fair Standing balance comment: Able to stand at sink without UE support to wash hands.                              Pertinent Vitals/Pain Pain Assessment: 0-10 Pain Score: 10-Worst pain ever Pain Location: back  Pain Descriptors / Indicators: Aching Pain Intervention(s): Limited activity within patient's tolerance;Monitored during session;Repositioned    Home Living Family/patient expects to be discharged to:: Private residence Living Arrangements: Non-relatives/Friends (roomate ) Available Help at Discharge: Friend(s);Available 24 hours/day;Family Type of Home: House Home Access: Stairs to enter Entrance Stairs-Rails: Left Entrance Stairs-Number of Steps: 2 Home Layout: One level Home Equipment: Cane - single point      Prior Function Level of Independence: Independent with assistive device(s)         Comments: Used cane for ambulation      Hand Dominance   Dominant Hand: Right    Extremity/Trunk Assessment   Upper Extremity Assessment Upper Extremity Assessment: RUE deficits/detail;LUE deficits/detail RUE Deficits / Details: Tingling  in hands  LUE Deficits / Details: Tingling in hands     Lower Extremity Assessment Lower Extremity Assessment: LLE deficits/detail;RLE deficits/detail RLE  Deficits / Details: Grossly 4/5 throughout  LLE Deficits / Details: Grossly 3/5 throughout.     Cervical / Trunk Assessment Cervical / Trunk Assessment: Other exceptions Cervical / Trunk Exceptions: back pain at baseline.   Communication   Communication: No difficulties  Cognition Arousal/Alertness: Awake/alert Behavior During Therapy: WFL for tasks assessed/performed Overall Cognitive Status: Within Functional Limits for tasks assessed                                        General Comments General comments (skin integrity, edema, etc.): Pt's son and his family present during session. Educated about DME recommendations and HHPT recommendations.     Exercises     Assessment/Plan    PT Assessment Patient needs continued PT services  PT Problem List Decreased strength;Decreased activity tolerance;Decreased balance;Decreased mobility;Decreased knowledge of use of DME;Pain       PT Treatment Interventions DME instruction;Gait training;Stair training;Functional mobility training;Therapeutic activities;Therapeutic exercise;Balance training;Neuromuscular re-education;Patient/family education    PT Goals (Current goals can be found in the Care Plan section)  Acute Rehab PT Goals Patient Stated Goal: to feel better  PT Goal Formulation: With patient Time For Goal Achievement: 07/05/17 Potential to Achieve Goals: Good    Frequency Min 3X/week   Barriers to discharge        Co-evaluation               AM-PAC PT "6 Clicks" Daily Activity  Outcome Measure Difficulty turning over in bed (including adjusting bedclothes, sheets and blankets)?: A Little Difficulty moving from lying on back to sitting on the side of the bed? : A Little Difficulty sitting down on and standing up from a chair with arms (e.g., wheelchair, bedside commode, etc,.)?: Unable Help needed moving to and from a bed to chair (including a wheelchair)?: A Little Help needed walking in  hospital room?: A Little Help needed climbing 3-5 steps with a railing? : A Lot 6 Click Score: 15    End of Session Equipment Utilized During Treatment: Gait belt Activity Tolerance: Patient tolerated treatment well Patient left: in chair;with call bell/phone within reach;with family/visitor present Nurse Communication: Mobility status;Other (comment) (pt had BM ) PT Visit Diagnosis: Unsteadiness on feet (R26.81);Muscle weakness (generalized) (M62.81);Pain Pain - part of body:  (back )    Time: 5038-8828 PT Time Calculation (min) (ACUTE ONLY): 25 min   Charges:   PT Evaluation $PT Eval Low Complexity: 1 Low PT Treatments $Gait Training: 8-22 mins   PT G Codes:   PT G-Codes **NOT FOR INPATIENT CLASS** Functional Assessment Tool Used: AM-PAC 6 Clicks Basic Mobility Functional Limitation: Mobility: Walking and moving around Mobility: Walking and Moving Around Current Status (M0349): At least 40 percent but less than 60 percent impaired, limited or restricted Mobility: Walking and Moving Around Goal Status [phone]): At least 1 percent but less than 20 percent impaired, limited or restricted    Leighton Ruff, PT, DPT  Acute Rehabilitation Services  Pager: [place] 06/28/2017, 11:24 AM

## 2017-06-28 NOTE — Care Management Note (Signed)
Case Management Note  Patient Details  Name: [name] MRN: [id number] Date of Birth: [birth year]  Subjective/Objective:                 Spoke with patient with family in the room. Patient states that she lives at home with friend, another elderly lady. Neither drive but both have family who provide transport. Both are able to ambulate w canes, patient's son did request RW for home use. Patient cooks and cleans and cares for self. Patient declined HH and family in agreeance. RW ordered and referral for delivery to room made to [facility] for today.    Action/Plan:  DC to home w RW.  Expected Discharge Date:                  Expected Discharge Plan:  Home/Self Care  In-House Referral:     Discharge planning Services  CM Consult  Post Acute Care Choice:  Durable Medical Equipment Choice offered to:  Patient  DME Arranged:  Walker rolling DME Agency:  [place]:  Patient Refused [place] Agency:     Status of Service:  Completed, signed off  If discussed at [place] of Stay Meetings, dates discussed:    Additional Comments:  Carles Collet, RN 06/28/2017, 2:01 PM

## 2017-06-28 NOTE — Progress Notes (Addendum)
PROGRESS NOTE   ERIC MORGANTI  RFF:[id number]    DOB: Oct 15, 1946    DOA: 06/27/2017  PCP: [name], MD   I have briefly reviewed patients previous medical records in [facility].  Brief Narrative:  71 year old female with PMH of type II DM/IDDM, HTN, HLD, stage IV chronic kidney disease (Dr. Mercy Moore >Dr. Justin Mend, Nephrology), failed right forearm AVG, CAD, carotid stenosis, GERD, who presented to the ED by EMS for hypoglycemia, hypothermia and decreased level of consciousness found by her roommate. EMS found her with blood sugar of 44, given D10 with marked improvement in mental status and hospitalized for further evaluation and management of hypoglycemia and acute on chronic kidney disease.   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 Type II DM/IDDM with hypoglycemia: Likely related to insulin in the context of worsening renal insufficiency. Lantus, Januvia and Actos were held. Appropriately resuscitated on the field with D10W. CBGs being closely monitored and no further episodes of hypoglycemia and CBG is currently well controlled. Last A1c 06/08/17:5.4 suggesting tight control. May consider discharging patient on no hypoglycemics, close home CBG monitoring and outpatient follow-up with PCP to determine further medication needs. 2. Hypothermia: Likely secondary to hypoglycemia. Resolved. No clinical concern for sepsis. 3. Somnolence: Secondary to #1 and 2. Resolved and mental status back to normal. 4. Acute on stage IV chronic kidney disease: Baseline creatinine of 2.6. Presented with creatinine of 3.6. Failed RUE AVG. Acute kidney injury may be due to transient volume depletion. Continue brief and gentle IV fluids. Creatinine has improved from 3.63 to 3.13. Follow BMP in a.m. Since Dr.  Mercy Moore is retiring, patient was now follow-up with Dr. Justin Mend, Nephrology. Temporarily hold Lasix, probably on this at home due to volume overload from renal insufficiency but currently appears slightly volume depleted. May need to discharge home on reduced dose of Lasix. 5. Essential hypertension: Controlled reasonably. 6. Hyperlipidemia: Continue statins. 7. CAD: Asymptomatic of chest pain. Continue aspirin and Plavix. 8. GERD: PPI 9. Anemia: Likely related to chronic kidney disease. Follow CBCs. 10. Hypokalemia: Replaced. 11. Chronic low back pain: Continue home medications. Outpatient follow-up with PCP.   DVT prophylaxis: Heparin Code Status: Full Family Communication: Discussed in detail with patient's 2 sons and daughter at bedside. Updated care and answered questions. Disposition: DC home when medically improved, possibly 9/6 with home health services.   Consultants:  None   Procedures:  None  Antimicrobials:  None    Subjective: Seen this morning. Feels much better. Denies fever, chills, confusion. Aware that she was brought to the hospital due to low blood sugars. Reports checking her CBGs once daily at home with CBGs ranging between 111-134 mostly. Lately has had periods of occasional low CBGs of 79 and 67.   ROS: Chronic low back pain for which she sees PCP. No dizziness, lightheadedness, asymmetric limb weakness, tingling or numbness. Stays with a roommate. Ambulates with the help of a cane.  Objective:  Vitals:   06/27/17 1851 06/27/17 2039 06/28/17 0625 06/28/17 0858  BP: 135/78 (!) 123/46 112/73 (!) 136/96  Pulse: 92 77 69 78  Resp: 13 16 17 17   Temp: 98.7 F (37.1 C) 98.3 F (36.8 C) 97.8 F (36.6 C) 97.9 F (36.6 C)  TempSrc:  Oral Oral Oral Oral  SpO2: 98% 99% 99% 98%  Weight:  86.2 kg (190 lb)    Height:        Examination:  General exam: Pleasant elderly female, moderately built and nourished sitting up comfortably in chair this  morning. Respiratory system: Clear to auscultation. Respiratory effort normal. Cardiovascular system: S1 & S2 heard, RRR. No JVD, murmurs, rubs, gallops or clicks. No pedal edema. Telemetry: Sinus rhythm. Gastrointestinal system: Abdomen is nondistended, soft and nontender. No organomegaly or masses felt. Normal bowel sounds heard. Central nervous system: Alert and oriented. No focal neurological deficits. Extremities: Symmetric 5 x 5 power. Skin: No rashes, lesions or ulcers. Nonfunctioning right forearm AVG without thrill. Psychiatry: Judgement and insight appear normal. Mood & affect appropriate.     Data Reviewed: I have personally reviewed following labs and imaging studies  CBC:  Recent Labs Lab 06/27/17 0530  WBC 10.4  NEUTROABS 8.5*  HGB 11.7*  HCT 35.6*  MCV 84.8  PLT 335   Basic Metabolic Panel:  Recent Labs Lab 06/27/17 0530 06/27/17 1520 06/28/17 0449  NA 137 137 141  K 3.4* 3.8 3.6  CL 99* 104 108  CO2 24 23 23   GLUCOSE 133* 113* 82  BUN 66* 59* 57*  CREATININE 3.63* 3.25* 3.13*  CALCIUM 8.8* 7.9* 7.9*   Liver Function Tests:  Recent Labs Lab 06/27/17 0530  AST 22  ALT 15  ALKPHOS 130*  BILITOT 0.9  PROT 7.0  ALBUMIN 3.1*   Coagulation Profile: No results for input(s): INR, PROTIME in the last 168 hours. Cardiac Enzymes: No results for input(s): CKTOTAL, CKMB, CKMBINDEX, TROPONINI in the last 168 hours. HbA1C: No results for input(s): HGBA1C in the last 72 hours. CBG:  Recent Labs Lab 06/27/17 1159 06/27/17 1651 06/27/17 2042 06/28/17 0746 06/28/17 1153  GLUCAP 134* 111* 176* 101* 125*    No results found for this or any previous visit (from the past 240 hour(s)).       Radiology Studies: No results found.      Scheduled Meds: . amLODipine  10 mg Oral Daily  . aspirin EC  81 mg Oral Daily  . atorvastatin  80 mg Oral q1800  . clopidogrel  75 mg Oral Daily  . furosemide  80 mg Oral BID  . heparin  5,000 Units  Subcutaneous Q8H  . insulin aspart  0-9 Units Subcutaneous TID WC  . metoprolol tartrate  100 mg Oral BID  . pantoprazole  40 mg Oral Daily  . potassium chloride  20 mEq Oral Daily  . timolol  1 drop Both Eyes Daily   Continuous Infusions: . sodium chloride 75 mL/hr at 06/28/17 1207     LOS: 0 days     Milford Cilento, MD, FACP, FHM. [facility] Pager [phone]  If 7PM-7AM, please contact night-coverage [email or web address] Password [email or web address] 06/28/2017, 2:46 PM

## 2017-06-29 DIAGNOSIS — E118 Type 2 diabetes mellitus with unspecified complications: Secondary | ICD-10-CM

## 2017-06-29 LAB — RENAL FUNCTION PANEL
ALBUMIN: 2.4 g/dL — AB (ref 3.5–5.0)
Anion gap: 10 (ref 5–15)
BUN: 53 mg/dL — AB (ref 6–20)
CALCIUM: 7.6 mg/dL — AB (ref 8.9–10.3)
CO2: 22 mmol/L (ref 22–32)
Chloride: 108 mmol/L (ref 101–111)
Creatinine, Ser: 2.8 mg/dL — ABNORMAL HIGH (ref 0.44–1.00)
GFR calc Af Amer: 18 mL/min — ABNORMAL LOW (ref >60)
GFR calc non Af Amer: 16 mL/min — ABNORMAL LOW (ref >60)
GLUCOSE: 113 mg/dL — AB (ref 65–99)
PHOSPHORUS: 2.8 mg/dL (ref 2.5–4.6)
Potassium: 3.4 mmol/L — ABNORMAL LOW (ref 3.5–5.1)
SODIUM: 140 mmol/L (ref 135–145)

## 2017-06-29 LAB — GLUCOSE, CAPILLARY
Glucose-Capillary: 111 mg/dL — ABNORMAL HIGH (ref 65–99)
Glucose-Capillary: 117 mg/dL — ABNORMAL HIGH (ref 65–99)

## 2017-06-29 MED ORDER — PREDNISONE 5 MG PO TABS
5.0000 mg | ORAL_TABLET | Freq: Every day | ORAL | Status: DC
  Administered 2017-06-29: 5 mg via ORAL
  Filled 2017-06-29: qty 1

## 2017-06-29 MED ORDER — FUROSEMIDE 80 MG PO TABS: 40.0000 mg | ORAL_TABLET | Freq: Two times a day (BID) | ORAL | Status: AC

## 2017-06-29 MED ORDER — INSULIN ASPART 100 UNIT/ML FLEXPEN: 0.0000 [IU] | PEN_INJECTOR | Freq: Three times a day (TID) | SUBCUTANEOUS | 0 refills | Status: DC

## 2017-06-29 MED ORDER — ACETAMINOPHEN 500 MG PO TABS: 500.0000 mg | ORAL_TABLET | Freq: Three times a day (TID) | ORAL | Status: AC | PRN

## 2017-06-29 NOTE — Discharge Instructions (Signed)

## 2017-06-29 NOTE — Discharge Summary (Addendum)
Physician Discharge Summary  [name] LKG:[id number] DOB: [birth year]  PCP: [name], MD  Admit date: 06/27/2017 Discharge date: 06/29/2017  Recommendations for Outpatient Follow-up:  1. Dr. Cathlean Cower, PCP in 4 days with repeat labs (CBC & BMP). 2. Dr. Edrick Oh, Nephrology in 1 week.  Home Health: PT-as per case management, patient declined. Equipment/Devices: Rolling walker with 5 inch wheels and 3 in 1.    Discharge Condition: Improved and stable  CODE STATUS: Full  Diet recommendation: Heart healthy and diabetic diet.  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 Mercy Moore >Dr. Justin Mend, Nephrology), failed right forearm AVG, CAD, carotid stenosis, GERD, who presented to the ED by EMS for hypoglycemia, hypothermia and decreased level of consciousness found by her roommate. EMS found her with blood sugar of 44, given D10 with marked improvement in mental status and hospitalized for further evaluation and management of hypoglycemia and acute on chronic kidney disease.   Assessment & Plan:   1. Type II DM/IDDM with hypoglycemia: Likely related to her diabetic medications (Lantus, Januvia and Actos) in the context of worsening renal insufficiency. Appropriately resuscitated on the field with D10W. Last A1c 06/08/17:5.4 suggesting tight control. Her CBGs were closely monitored in the hospital and where reasonably controlled with minimal requirement of sliding scale NovoLog insulin. Patient is on chronic prednisone 5 mg at home for unclear indication which was resumed today and may cause some further hyperglycemia. There are  ongoing concerns for recurrent hypoglycemia if she were to be discharged on her prior home medications. Actos should be discontinued indefinitely given history of volume overload and being on diuretics. At this time after having discussed extensively with her, will stop her Lantus and Januvia and discharge her on NovoLog flex pen sliding scale insulin. She has been extensively educated by RN regarding management of same. She has been advised to maintain a log of her CBG checks at least 3 times a day and take it to her PCP during follow-up next week at which time determination can be made if she needs to resume Lantus and or Januvia at lower doses. 2. Hypothermia: Likely secondary to hypoglycemia. Resolved. No clinical concern for sepsis. 3. Somnolence: Secondary to #1 and 2. Resolved and mental status back to normal. 4. Acute on stage IV chronic kidney disease: Baseline creatinine of 2.6. Presented with creatinine of 3.6. Failed RUE AVG. Acute kidney injury may be due to transient volume depletion. Treated with brief and gentle IV fluids and Lasix was temporarily held. Creatinine has improved to 2.8. Clinically appears euvolemic. Given her risk for volume overload, will resume Lasix but at reduced dose from 80 to 40 mg twice a day with close outpatient follow-up with PCP and Nephrology to determine further dose adjustment. Since Dr. Mercy Moore is retiring, patient reports that she will now follow-up with Dr. Justin Mend, Nephrology.  5. Essential hypertension: Controlled reasonably. 6. Hyperlipidemia: Continue statins. 7. CAD: Asymptomatic of chest pain. Continue aspirin and Plavix. 8. GERD: PPI 9. Anemia: Likely related to chronic kidney disease. Stable. 10. Hypokalemia: Replaced. 11. Chronic low back pain: Continue home medications. Outpatient  follow-up with PCP.   Consultants:  None   Procedures:  None   Discharge Instructions  Discharge Instructions    (HEART FAILURE PATIENTS) Call MD:  Anytime  you have any of the following symptoms: 1) 3 pound weight gain in 24 hours or 5 pounds in 1 week 2) shortness of breath, with or without a dry hacking cough 3) swelling in the hands, feet or stomach 4) if you have to sleep on extra pillows at night in order to breathe.    Complete by:  As directed    Call MD for:    Complete by:  As directed    Confusion. Low or high blood sugars.   Call MD for:  difficulty breathing, headache or visual disturbances    Complete by:  As directed    Call MD for:  extreme fatigue    Complete by:  As directed    Call MD for:  persistant dizziness or light-headedness    Complete by:  As directed    Diet - low sodium heart healthy    Complete by:  As directed    Diet Carb Modified    Complete by:  As directed    Increase activity slowly    Complete by:  As directed        Medication List    STOP taking these medications   JANUVIA 100 MG tablet Generic drug:  sitaGLIPtin   LANTUS SOLOSTAR 100 UNIT/ML Solostar Pen Generic drug:  Insulin Glargine   pioglitazone 45 MG tablet Commonly known as:  ACTOS     TAKE these medications   ACCU-CHEK AVIVA PLUS w/Device Kit USE AS DIRECTED ONCE DAILY   acetaminophen 500 MG tablet Commonly known as:  TYLENOL Take 1-2 tablets (500-1,000 mg total) by mouth every 8 (eight) hours as needed for mild pain. What changed:  when to take this  reasons to take this   allopurinol 100 MG tablet Commonly known as:  ZYLOPRIM Take 100 mg by mouth daily.   amLODipine 10 MG tablet Commonly known as:  NORVASC TAKE ONE (1) TABLET EACH DAY   aspirin 81 MG tablet Take 81 mg by mouth daily.   atorvastatin 80 MG tablet Commonly known as:  LIPITOR Take 1 tablet (80 mg total) by mouth daily at 6 PM.   BD PEN NEEDLE NANO U/F 32G X 4 MM Misc Generic drug:  Insulin Pen Needle USE DAILY WITH LANTUS SOLASTAR   blood glucose meter kit and supplies Kit Dispense based on patient and insurance preference. Use up to three  times daily as directed. (FOR ICD-9 250.00, 250.01).   calcitRIOL 0.25 MCG capsule Commonly known as:  ROCALTROL Take 0.25 mcg by mouth daily.   clopidogrel 75 MG tablet Commonly known as:  PLAVIX Take 75 mg by mouth daily.   ferrous sulfate 325 (65 FE) MG tablet TAKE 1 TABLET (325 MG TOTAL) BY MOUTH 2 (TWO) TIMES DAILY.   furosemide 80 MG tablet Commonly known as:  LASIX Take 0.5 tablets (40 mg total) by mouth 2 (two) times daily. What changed:  how much to take  when to take this   glucose blood test strip ACCU CHECK - Use as instructed once daily   insulin aspart 100 UNIT/ML FlexPen Commonly known as:  NOVOLOG FLEXPEN Inject 0-9 Units into the skin 3 (three) times daily with meals. CBG < 70: eat or drink something sweet and recheck. CBG 70 - 120: 0 units CBG 121 - 150: 1 unit CBG  151 - 200: 2 units CBG 201 - 250: 3 units CBG 251 - 300: 5 units CBG 301 - 350: 7 units CBG 351 - 400 9 units CBG > 400 call MD.   Lancets Misc Use as directed three times daily with meals   metoprolol tartrate 100 MG tablet Commonly known as:  LOPRESSOR TAKE 1 TABLET BY MOUTH TWICE A DAY   nitroGLYCERIN 0.4 MG SL tablet Commonly known as:  NITROSTAT Place 0.4 mg under the tongue every 5 (five) minutes as needed for chest pain. Reported on 04/06/2016   omeprazole 20 MG capsule Commonly known as:  PRILOSEC TAKE 1 CAPSULE (20 MG TOTAL) BY MOUTH DAILY.   potassium chloride 10 MEQ tablet Commonly known as:  K-DUR,KLOR-CON Take 2 tablets (20 mEq total) by mouth daily.   predniSONE 5 MG tablet Commonly known as:  DELTASONE Take 5 mg by mouth daily with breakfast.   risedronate 150 MG tablet Commonly known as:  ACTONEL TAKE 1 TABLET BY MOUTH ONCE A MONTH-TAKE ON THE SAME DAY OF EACH MONTH.   timolol 0.5 % ophthalmic solution Commonly known as:  BETIMOL Place 1 drop into both eyes daily.   traMADol 50 MG tablet Commonly known as:  ULTRAM Take 1 tablet (50 mg total) by mouth every 6  (six) hours as needed. What changed:  reasons to take this      [place] Follow up.   Why:  RW to be delivered to room prior to DC.  Contact information: [address] [address] [phone]        [name], MD. Schedule an appointment as soon as possible for a visit in 4 day(s).   Specialties:  Internal Medicine, Radiology Why:  To be seen with repeat labs (CBC & BMP). Contact information: [place] [address] [phone]        Edrick Oh, MD. Schedule an appointment as soon as possible for a visit in 1 week(s).   Specialty:  Nephrology Contact information: [place] [address] [phone]          No Known Allergies     Subjective: Seen this morning. Denied complaints. No dyspnea, chest pain, cough, dizziness or lightheadedness. Tolerating diet well. No nausea or vomiting reported. As per RN, no acute issues reported.  Discharge Exam:  Vitals:   06/28/17 2152 06/29/17 0637 06/29/17 0847 06/29/17 0906  BP: (!) 129/46 (!) 129/50 (!) 145/60 (!) 131/53  Pulse: 79 70 79 75  Resp: '18 18 19 20  '$ Temp: 98.4 F (36.9 C) 98.2 F (36.8 C) 97.9 F (36.6 C) 97.8 F (36.6 C)  TempSrc: Oral Oral Oral Oral  SpO2: 100% 97% 99% 96%  Weight: 89.4 kg (197 lb)     Height:        General exam: Pleasant elderly female, moderately built and nourished lying comfortably propped up in bed. Respiratory system: Clear to auscultation. Respiratory effort normal. Cardiovascular system: S1 & S2 heard, RRR. No JVD, murmurs, rubs, gallops or clicks. No pedal edema. Telemetry: Sinus rhythm. Gastrointestinal system: Abdomen is nondistended, soft and nontender. No organomegaly or masses felt. Normal bowel sounds heard. Central nervous system: Alert and oriented. No focal neurological deficits. Extremities: Symmetric 5 x 5 power. Skin: No rashes, lesions or ulcers. Nonfunctioning  right forearm AVG without thrill. Psychiatry: Judgement and insight appear normal. Mood & affect appropriate.  The results of significant diagnostics from this hospitalization (including imaging, microbiology, ancillary and laboratory) are listed below for reference.       Labs: CBC:  Recent Labs Lab 06/27/17 0530  WBC 10.4  NEUTROABS 8.5*  HGB 11.7*  HCT 35.6*  MCV 84.8  PLT 557   Basic Metabolic Panel:  Recent Labs Lab 06/27/17 0530 06/27/17 1520 06/28/17 0449 06/29/17 0434  NA 137 137 141 140  K 3.4* 3.8 3.6 3.4*  CL 99* 104 108 108  CO2 '24 23 23 22  '$ GLUCOSE 133* 113* 82 113*  BUN 66* 59* 57* 53*  CREATININE 3.63* 3.25* 3.13* 2.80*  CALCIUM 8.8* 7.9* 7.9* 7.6*  PHOS  --   --   --  2.8   Liver Function Tests:  Recent Labs Lab 06/27/17 0530 06/29/17 0434  AST 22  --   ALT 15  --   ALKPHOS 130*  --   BILITOT 0.9  --   PROT 7.0  --   ALBUMIN 3.1* 2.4*   CBG:  Recent Labs Lab 06/28/17 1153 06/28/17 1655 06/28/17 2149 06/29/17 0749 06/29/17 1154  GLUCAP 125* 169* 129* 111* 117*   Urinalysis    Component Value Date/Time   COLORURINE STRAW (A) 06/27/2017 0620   APPEARANCEUR CLEAR 06/27/2017 0620   LABSPEC 1.006 06/27/2017 0620   PHURINE 5.0 06/27/2017 0620   GLUCOSEU NEGATIVE 06/27/2017 0620   GLUCOSEU NEGATIVE 06/08/2017 1553   HGBUR SMALL (A) 06/27/2017 0620   BILIRUBINUR NEGATIVE 06/27/2017 0620   KETONESUR NEGATIVE 06/27/2017 0620   PROTEINUR NEGATIVE 06/27/2017 0620   UROBILINOGEN 0.2 06/08/2017 1553   NITRITE NEGATIVE 06/27/2017 0620   LEUKOCYTESUR SMALL (A) 06/27/2017 0620      Time coordinating discharge: Less than 30 minutes  SIGNED:  Vernell Leep, MD, FACP, FHM. [facility] Pager [phone]  If 7PM-7AM, please contact night-coverage [email or web address] Password [facility] 06/29/2017, 4:43 PM

## 2017-06-29 NOTE — Progress Notes (Signed)
Physical Therapy Treatment Patient Details Name: [name] MRN: [id number] DOB: Jan 09, 1946 Today's Date: 06/29/2017    History of Present Illness Pt is a 71 y/o female admitted secondary to hypoglycemia and unresponsiveness. PMH includes DM, CKD, CAD s/p stent placement, PVD, HTN, osteoporosis, and s/p R AV fistula placement.     PT Comments    Pt making progress towards her goals and is currently mod I for bed mobility, min guard for transfers, and supervision for ambulation of 75 feet with RW, and min guard for ascent/descent of 2 steps. Pt mobility is limited by back pain and mild DoE. Pt educated on energy conservation and need for short bouts of mobility to keep her strength and improve her endurance. Pt requires skilled PT to progress her gait training and to improve endurance for safe mobilization in her home environment at discharge.     Follow Up Recommendations  Home health PT;Supervision/Assistance - 24 hour     Equipment Recommendations  Rolling walker with 5" wheels;3in1 (PT)       Precautions / Restrictions Precautions Precautions: Fall Restrictions Weight Bearing Restrictions: No    Mobility  Bed Mobility Overal bed mobility: Needs Assistance Bed Mobility: Supine to Sit     Supine to sit: Modified independent (Device/Increase time)     General bed mobility comments: HoB elevated and used bed rail for exiting bed  Transfers Overall transfer level: Needs assistance Equipment used: Rolling walker (2 wheeled);None Transfers: Sit to/from Stand Sit to Stand: Min guard         General transfer comment: min guard for safety, good power up and able to steady in standing before reaching to AD vc to not begin gait until assessing stability in standing  Ambulation/Gait Ambulation/Gait assistance: Supervision Ambulation Distance (Feet): 75 Feet Assistive device: Rolling walker (2 wheeled) Gait Pattern/deviations: Step-through pattern;Decreased stride  length;Trunk flexed Gait velocity: Decreased Gait velocity interpretation: Below normal speed for age/gender General Gait Details: Slow, steady gait with RW, vc for managing RW around obstacles in hallway    Stairs Stairs: Yes   Stair Management: One rail Left;Sideways;Step to pattern;Forwards Number of Stairs: 2 General stair comments: min guard for safety vc for RW management to reach for railing and sequencing for safety  Wheelchair Mobility    Modified Rankin (Stroke Patients Only)       Balance Overall balance assessment: Needs assistance Sitting-balance support: No upper extremity supported;Feet supported Sitting balance-Leahy Scale: Good     Standing balance support: Bilateral upper extremity supported;During functional activity;No upper extremity supported Standing balance-Leahy Scale: Fair Standing balance comment: able to come to standing and steady before reaching to RW for support                            Cognition Arousal/Alertness: Awake/alert Behavior During Therapy: WFL for tasks assessed/performed Overall Cognitive Status: Within Functional Limits for tasks assessed                                           General Comments General comments (skin integrity, edema, etc.): VSS      Pertinent Vitals/Pain Pain Assessment: Faces Faces Pain Scale: Hurts little more Pain Location: back  Pain Descriptors / Indicators: Aching Pain Intervention(s): Monitored during session;Limited activity within patient's tolerance           PT Goals (current  goals can now be found in the care plan section) Acute Rehab PT Goals Patient Stated Goal: to feel better  PT Goal Formulation: With patient Time For Goal Achievement: 07/05/17 Potential to Achieve Goals: Good Progress towards PT goals: Progressing toward goals    Frequency    Min 3X/week      PT Plan Current plan remains appropriate    Co-evaluation               AM-PAC PT "6 Clicks" Daily Activity  Outcome Measure  Difficulty turning over in bed (including adjusting bedclothes, sheets and blankets)?: A Little Difficulty moving from lying on back to sitting on the side of the bed? : A Little Difficulty sitting down on and standing up from a chair with arms (e.g., wheelchair, bedside commode, etc,.)?: A Little Help needed moving to and from a bed to chair (including a wheelchair)?: A Little Help needed walking in hospital room?: A Little Help needed climbing 3-5 steps with a railing? : A Lot 6 Click Score: 17    End of Session Equipment Utilized During Treatment: Gait belt Activity Tolerance: Patient tolerated treatment well Patient left: in chair;with call bell/phone within reach;with family/visitor present Nurse Communication: Mobility status;Other (comment) (pt had BM ) PT Visit Diagnosis: Unsteadiness on feet (R26.81);Muscle weakness (generalized) (M62.81);Pain Pain - part of body:  (back )     Time: 4680-3212 PT Time Calculation (min) (ACUTE ONLY): 25 min  Charges:  $Gait Training: 23-37 mins                    G Codes:  Functional Assessment Tool Used: AM-PAC 6 Clicks Basic Mobility Functional Limitation: Mobility: Walking and moving around Mobility: Walking and Moving Around Current Status (Y4825): At least 40 percent but less than 60 percent impaired, limited or restricted Mobility: Walking and Moving Around Goal Status [id number]): At least 1 percent but less than 20 percent impaired, limited or restricted    Benjamine Mola B. Migdalia Dk PT, DPT Acute Rehabilitation  [phone] Pager [phone]     [place] 06/29/2017, 9:34 AM

## 2017-06-29 NOTE — Progress Notes (Signed)
[name] to be D/C'd Home per MD order.  Discussed prescriptions and follow up appointments with the patient. Prescriptions given to patient, medication list explained in detail. Pt verbalized understanding.  Allergies as of 06/29/2017   No Known Allergies     Medication List    STOP taking these medications   JANUVIA 100 MG tablet Generic drug:  sitaGLIPtin   LANTUS SOLOSTAR 100 UNIT/ML Solostar Pen Generic drug:  Insulin Glargine   pioglitazone 45 MG tablet Commonly known as:  ACTOS     TAKE these medications   ACCU-CHEK AVIVA PLUS w/Device Kit USE AS DIRECTED ONCE DAILY   acetaminophen 500 MG tablet Commonly known as:  TYLENOL Take 1-2 tablets (500-1,000 mg total) by mouth every 8 (eight) hours as needed for mild pain. What changed:  when to take this  reasons to take this   allopurinol 100 MG tablet Commonly known as:  ZYLOPRIM Take 100 mg by mouth daily.   amLODipine 10 MG tablet Commonly known as:  NORVASC TAKE ONE (1) TABLET EACH DAY   aspirin 81 MG tablet Take 81 mg by mouth daily.   atorvastatin 80 MG tablet Commonly known as:  LIPITOR Take 1 tablet (80 mg total) by mouth daily at 6 PM.   BD PEN NEEDLE NANO U/F 32G X 4 MM Misc Generic drug:  Insulin Pen Needle USE DAILY WITH LANTUS SOLASTAR   blood glucose meter kit and supplies Kit Dispense based on patient and insurance preference. Use up to three times daily as directed. (FOR ICD-9 250.00, 250.01).   calcitRIOL 0.25 MCG capsule Commonly known as:  ROCALTROL Take 0.25 mcg by mouth daily.   clopidogrel 75 MG tablet Commonly known as:  PLAVIX Take 75 mg by mouth daily.   ferrous sulfate 325 (65 FE) MG tablet TAKE 1 TABLET (325 MG TOTAL) BY MOUTH 2 (TWO) TIMES DAILY.   furosemide 80 MG tablet Commonly known as:  LASIX Take 0.5 tablets (40 mg total) by mouth 2 (two) times daily. What changed:  how much to take  when to take this   glucose blood test strip ACCU CHECK - Use as  instructed once daily   insulin aspart 100 UNIT/ML FlexPen Commonly known as:  NOVOLOG FLEXPEN Inject 0-9 Units into the skin 3 (three) times daily with meals. CBG < 70: eat or drink something sweet and recheck. CBG 70 - 120: 0 units CBG 121 - 150: 1 unit CBG 151 - 200: 2 units CBG 201 - 250: 3 units CBG 251 - 300: 5 units CBG 301 - 350: 7 units CBG 351 - 400 9 units CBG > 400 call MD.   Lancets Misc Use as directed three times daily with meals   metoprolol tartrate 100 MG tablet Commonly known as:  LOPRESSOR TAKE 1 TABLET BY MOUTH TWICE A DAY   nitroGLYCERIN 0.4 MG SL tablet Commonly known as:  NITROSTAT Place 0.4 mg under the tongue every 5 (five) minutes as needed for chest pain. Reported on 04/06/2016   omeprazole 20 MG capsule Commonly known as:  PRILOSEC TAKE 1 CAPSULE (20 MG TOTAL) BY MOUTH DAILY.   potassium chloride 10 MEQ tablet Commonly known as:  K-DUR,KLOR-CON Take 2 tablets (20 mEq total) by mouth daily.   predniSONE 5 MG tablet Commonly known as:  DELTASONE Take 5 mg by mouth daily with breakfast.   risedronate 150 MG tablet Commonly known as:  ACTONEL TAKE 1 TABLET BY MOUTH ONCE A MONTH-TAKE ON THE SAME DAY  OF EACH MONTH.   timolol 0.5 % ophthalmic solution Commonly known as:  BETIMOL Place 1 drop into both eyes daily.   traMADol 50 MG tablet Commonly known as:  ULTRAM Take 1 tablet (50 mg total) by mouth every 6 (six) hours as needed. What changed:  reasons to take this            Durable Medical Equipment        Start     Ordered   06/29/17 1311  For home use only DME Walker rolling  [facility])  Once    Comments:  With 5 inch wheels.  Question:  Patient needs a walker to treat with the following condition  Answer:  Physical deconditioning   06/29/17 1312   06/29/17 1310  DME 3-in-1  Once     06/29/17 1312   06/28/17 1401  For home use only DME Walker rolling  Once    Question:  Patient needs a walker to treat with the following condition   Answer:  Weakness   06/28/17 1400       Discharge Care Instructions        Start     Ordered   06/29/17 0000  acetaminophen (TYLENOL) 500 MG tablet  Every 8 hours PRN     06/29/17 1312   06/29/17 0000  furosemide (LASIX) 80 MG tablet  2 times daily     06/29/17 1312   06/29/17 0000  insulin aspart (NOVOLOG FLEXPEN) 100 UNIT/ML FlexPen  3 times daily with meals     06/29/17 1312   06/29/17 0000  Increase activity slowly     06/29/17 1312   06/29/17 0000  Diet - low sodium heart healthy     06/29/17 1312   06/29/17 0000  Diet Carb Modified     06/29/17 1312   06/29/17 0000  Call MD for:    Comments:  Confusion. Low or high blood sugars.   06/29/17 1312   06/29/17 0000  Call MD for:  difficulty breathing, headache or visual disturbances     06/29/17 1312   06/29/17 0000  Call MD for:  persistant dizziness or light-headedness     06/29/17 1312   06/29/17 0000  Call MD for:  extreme fatigue     06/29/17 1312   06/29/17 0000  (HEART FAILURE PATIENTS) Call MD:  Anytime you have any of the following symptoms: 1) 3 pound weight gain in 24 hours or 5 pounds in 1 week 2) shortness of breath, with or without a dry hacking cough 3) swelling in the hands, feet or stomach 4) if you have to sleep on extra pillows at night in order to breathe.     06/29/17 1312      Vitals:   06/29/17 0847 06/29/17 0906  BP: (!) 145/60 (!) 131/53  Pulse: 79 75  Resp: 19 20  Temp: 97.9 F (36.6 C) 97.8 F (36.6 C)  SpO2: 99% 96%    Skin clean, dry and intact without evidence of skin break down, no evidence of skin tears noted. IV catheter discontinued intact. Site without signs and symptoms of complications. Dressing and pressure applied. Pt denies pain at this time. No complaints noted.  An After Visit Summary was printed and given to the patient. Patient escorted via [place], and D/C home via private auto.  Dixie Dials RN, BSN

## 2017-06-29 NOTE — Consult Note (Signed)
[facility] CM Primary Care Navigator  06/29/2017  [name] 1946/01/20 [id number]   Met with patient at the bedside to identify possible discharge needs.  Patient reports "my blood sugar dropped for the first time" which had led to this admission. Patientconfirmed Dr. Cathlean Cower with [place] [facility] care provider.   [facility] [address] obtain medications without difficulty.   She verbalizedmanaging her medicationsat home straight out of the container but plans to use "pill box" system after discharge.  Patient reports that daughter [name]) and granddaughter [name]) has been providingtransportation to herdoctors'appointments.  Patient reports that she cares for self at home, although, she lives with a friend [name]) who will be able to provide assistancewith her careneeds if needed.  Anticipated discharge plan ishome according to patient.Per Inpatient CM note, patient had declined home health services and family had agreed to it.  Patientvoiced understanding to callprimary care provider's officewhen shegets hometo schedulea post hospitalfollow-upvisit within a week or sooner if needed. Patient letter (with PCP's contact number) was provided as a reminder.  Explained to patient regarding[facility]N CM services available for herbut she only opted and verbally agreed to [facility] follow-up calls to monitor herrecovery at home. Patient states being able to manage and care for herself and does "not need help at this point".  Patient verbalized that she knows to ask for help if she needs it.  Referral made for [facility] calls after discharge.   Patient statedunderstandingto seek referral from primary care provider to [facility] care management if deemed necessaryfor services in the near future.  [facility] care management information provided for future needs that may arise.   For questions,  please contact:  Dannielle Huh, BSN, RN- [facility] Primary Care Navigator  Telephone: [phone] [place]

## 2017-06-29 NOTE — Care Management Note (Signed)
Case Management Note  Patient Details  Name: [name] MRN: [id number] Date of Birth: 31-Mar-1946  Subjective/Objective:      CM following for progression and d/c planning.               Action/Plan: 06/29/2017 Noted [place] services offered on 06/28/2017 and pt declined. Walker ordered on 06/28/2017. No other needs identified.   Expected Discharge Date:                  Expected Discharge Plan:  Home/Self Care  In-House Referral:     Discharge planning Services  CM Consult  Post Acute Care Choice:  Durable Medical Equipment Choice offered to:  Patient  DME Arranged:  Walker rolling DME Agency:  [place]:  Patient Refused [place] Agency:  NA  Status of Service:  Completed, signed off  If discussed at [place] of Stay Meetings, dates discussed:    Additional Comments:  Adron Bene, RN 06/29/2017, 11:46 AM

## 2017-06-30 ENCOUNTER — Telehealth: Payer: Self-pay | Admitting: *Deleted

## 2017-06-30 ENCOUNTER — Other Ambulatory Visit: Payer: Self-pay | Admitting: Internal Medicine

## 2017-06-30 ENCOUNTER — Other Ambulatory Visit: Payer: Self-pay

## 2017-06-30 NOTE — Telephone Encounter (Signed)
I would not feel comfortable with refills.  Ok for OV and/or ask pt to be seen at [facility] clinic tomorrow

## 2017-06-30 NOTE — Telephone Encounter (Signed)
See below

## 2017-06-30 NOTE — Progress Notes (Signed)
Progress Note  I was informed this morning by the nursing that the patient's family had called post discharge indicating that patient's insurance does not cover NovoLog but covers Humalog. I called patient's listed pharmacy, discussed with the pharmacist Lanny Hurst and called in prescription for Humalog sliding scale. I called and updated patient's daughter.   Vernell Leep, MD, FACP, FHM. [facility] Pager [phone]  If 7PM-7AM, please contact night-coverage [email or web address] Password [email or web address] 06/30/2017, 11:42 AM

## 2017-06-30 NOTE — Patient Outreach (Signed)
[facility]) Care Management  06/30/17  [name] 09/09/1946 [id number]  RNCM received referral on 06/30/2017 for transition of care follow up of patient. Patient had a recent hospitalization 9/4-06/29/2017 for hypoglycemia.  Successful outreach completed with patient. Patient identification verified.   RNCM provided education about [facility] Care Management and role of [facility] RNCM and patient stated that she feels like she has everything under control and does not need follow up. She was willing to take down RNCM contact information and stated she would call if she needs anything in the future.   Prior to ending call, RNCM was able to verify patient's understanding of her new medication regimen for her diabetes. She verbalized that she was taken off her Januvia and Lantus and now takes insulin based on what her blood sugars are. Patient verbalized that she has and understands the instructions for her sliding scale insulin and was able to read those to the [facility] and demonstrate knowledge that she knows how to follow it. RNCM provided education about keeping glucose tablets or something sweet with her for when her sugar drops below 70 and patient verbalized understanding, stating that she always keeps something sweet around for that.  Plan: RNCM will close case as patient has declined services and will notify PCP of closure.  Ashley R. Areej Tayler, RN, BSN, [place] Management Coordinator [phone]

## 2017-06-30 NOTE — Telephone Encounter (Signed)
Transition Care Management Follow-up Telephone Call   Date discharged? 06/29/17   How have you been since you were released from the hospital? [place] w/ daughter [name]) states mom is during ok   Do you understand why you were in the hospital? YES   Do you understand the discharge instructions? YES   Where were you discharged to? Home   Items Reviewed:  Medications reviewed: YES  Allergies reviewed: YES  Dietary changes reviewed: YES, heart and diabetic diet  Referrals reviewed: daughter states they are waiting to her back from kidney MD   Functional Questionnaire:   Activities of Daily Living (ADLs):   She states mom are independent in the following: feeding, grooming, toileting and dressing States she require assistance with the following: ambulation, bathing and hygiene and continence   Any transportation issues/concerns?: NO   Any patient concerns? NO   Confirmed importance and date/time of follow-up visits scheduled YES, appt 07/06/17  Provider Appointment booked with Dr. [name]   Confirmed with patient if condition begins to worsen call PCP or go to the ER.  Patient was given the office number and encouraged to call back with question or concerns.  : YES

## 2017-07-04 DIAGNOSIS — Z79899 Other long term (current) drug therapy: Secondary | ICD-10-CM | POA: Diagnosis not present

## 2017-07-04 DIAGNOSIS — N183 Chronic kidney disease, stage 3 (moderate): Secondary | ICD-10-CM | POA: Diagnosis not present

## 2017-07-04 DIAGNOSIS — M1A9XX1 Chronic gout, unspecified, with tophus (tophi): Secondary | ICD-10-CM | POA: Diagnosis not present

## 2017-07-04 DIAGNOSIS — M1A09X0 Idiopathic chronic gout, multiple sites, without tophus (tophi): Secondary | ICD-10-CM | POA: Diagnosis not present

## 2017-07-06 ENCOUNTER — Ambulatory Visit (INDEPENDENT_AMBULATORY_CARE_PROVIDER_SITE_OTHER): Payer: Medicare Other | Admitting: Internal Medicine

## 2017-07-06 ENCOUNTER — Other Ambulatory Visit (INDEPENDENT_AMBULATORY_CARE_PROVIDER_SITE_OTHER): Payer: Medicare Other

## 2017-07-06 ENCOUNTER — Encounter: Payer: Self-pay | Admitting: Internal Medicine

## 2017-07-06 VITALS — BP 124/88 | HR 84 | Temp 97.9°F | Ht 61.0 in

## 2017-07-06 DIAGNOSIS — Z23 Encounter for immunization: Secondary | ICD-10-CM | POA: Diagnosis not present

## 2017-07-06 DIAGNOSIS — E1022 Type 1 diabetes mellitus with diabetic chronic kidney disease: Secondary | ICD-10-CM

## 2017-07-06 DIAGNOSIS — N185 Chronic kidney disease, stage 5: Secondary | ICD-10-CM

## 2017-07-06 DIAGNOSIS — E118 Type 2 diabetes mellitus with unspecified complications: Secondary | ICD-10-CM

## 2017-07-06 DIAGNOSIS — M545 Low back pain: Secondary | ICD-10-CM

## 2017-07-06 LAB — CBC WITH DIFFERENTIAL/PLATELET
BASOS ABS: 0.1 10*3/uL (ref 0.0–0.1)
Basophils Relative: 1.3 % (ref 0.0–3.0)
EOS PCT: 2.3 % (ref 0.0–5.0)
Eosinophils Absolute: 0.2 10*3/uL (ref 0.0–0.7)
HCT: 35.5 % — ABNORMAL LOW (ref 36.0–46.0)
Hemoglobin: 11.4 g/dL — ABNORMAL LOW (ref 12.0–15.0)
LYMPHS ABS: 1.6 10*3/uL (ref 0.7–4.0)
Lymphocytes Relative: 17.3 % (ref 12.0–46.0)
MCHC: 32.1 g/dL (ref 30.0–36.0)
MCV: 87.6 fl (ref 78.0–100.0)
MONOS PCT: 5.7 % (ref 3.0–12.0)
Monocytes Absolute: 0.5 10*3/uL (ref 0.1–1.0)
NEUTROS ABS: 6.9 10*3/uL (ref 1.4–7.7)
Neutrophils Relative %: 73.4 % (ref 43.0–77.0)
PLATELETS: 395 10*3/uL (ref 150.0–400.0)
RBC: 4.06 Mil/uL (ref 3.87–5.11)
RDW: 17.7 % — ABNORMAL HIGH (ref 11.5–15.5)
WBC: 9.5 10*3/uL (ref 4.0–10.5)

## 2017-07-06 LAB — HEPATIC FUNCTION PANEL
ALK PHOS: 129 U/L — AB (ref 39–117)
ALT: 16 U/L (ref 0–35)
AST: 13 U/L (ref 0–37)
Albumin: 3.8 g/dL (ref 3.5–5.2)
BILIRUBIN DIRECT: 0.1 mg/dL (ref 0.0–0.3)
TOTAL PROTEIN: 7.1 g/dL (ref 6.0–8.3)
Total Bilirubin: 0.4 mg/dL (ref 0.2–1.2)

## 2017-07-06 LAB — BASIC METABOLIC PANEL
BUN: 64 mg/dL — ABNORMAL HIGH (ref 6–23)
CALCIUM: 9.4 mg/dL (ref 8.4–10.5)
CHLORIDE: 104 meq/L (ref 96–112)
CO2: 22 meq/L (ref 19–32)
Creatinine, Ser: 2.8 mg/dL — ABNORMAL HIGH (ref 0.40–1.20)
GFR: 21.3 mL/min — ABNORMAL LOW (ref >60.00)
Glucose, Bld: 216 mg/dL — ABNORMAL HIGH (ref 70–99)
POTASSIUM: 4.3 meq/L (ref 3.5–5.1)
Sodium: 139 mEq/L (ref 135–145)

## 2017-07-06 MED ORDER — TRAMADOL HCL 50 MG PO TABS: 50.0000 mg | ORAL_TABLET | Freq: Four times a day (QID) | ORAL | 1 refills | Status: DC | PRN

## 2017-07-06 MED ORDER — TIZANIDINE HCL 4 MG PO TABS: 4.0000 mg | ORAL_TABLET | Freq: Four times a day (QID) | ORAL | 1 refills | Status: DC | PRN

## 2017-07-06 NOTE — Progress Notes (Signed)
Subjective:    Patient ID: [name], female    DOB: 09/25/1946, 71 y.o.   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 Mercy Moore >Dr. Justin Mend, Nephrology), failed right forearm AVG, CAD, carotid stenosis, GERD, who presented to the ED by EMS for hypoglycemia, hypothermia and decreased level of consciousness found by her roommate. EMS found her with blood sugar of 44, given D10 with marked improvement in mental status and hospitalized for further evaluation and management of hypoglycemia and acute on chronic kidney disease. A1c was 5.4 c/w good control.  Lantus and Celesta Gentile were able to be discontinued, and DM education done.  Pt has been on Novolog flex pen SSI and cbg's have been low 100s at home post d/c.  Was sent home with new walker which she does use and no falls.  Has f/u with renal appt on sept 25.  Today only c/o Pt continues to have recurring LBP without change in severity but needs better pain control, but no bowel or bladder change, fever, wt loss,  worsening LE pain/numbness/weakness Past Medical History:  Diagnosis Date  . Anemia, iron deficiency   . Arthritis   . CAD (coronary artery disease)   . Carotid stenosis    Carotid US (02/2014):  Bilateral ICA 40-59%; > 50% L ECA; F/u 1 year  . Cataract    removed both eyes  . Chronic kidney disease    chronic renal failure  . DM2 (diabetes mellitus, type 2) ([place])   . GERD (gastroesophageal reflux disease)   . Glaucoma   . HLD (hyperlipidemia)   . HTN (hypertension)   . Hx of cardiovascular stress test    Lexiscan Myoview (03/24/14):  No ischemia, EF 73%, Normal  . Hypoglycemia 06/27/2017  . Mitral regurgitation   . Osteoporosis   . PUD (peptic ulcer disease)   . PVD (peripheral vascular disease) ([place])   . Shortness of breath    with exertion   Past Surgical History:  Procedure Laterality  Date  . ABDOMINAL HYSTERECTOMY    . APPENDECTOMY    . AV FISTULA PLACEMENT Right 01/18/2013   Procedure: ARTERIOVENOUS (AV) FISTULA CREATION;  Surgeon: Rosetta Posner, MD;  Location: [place];  Service: Vascular;  Laterality: Right;  Ultrasound guided  . COLONOSCOPY    . CORONARY ANGIOPLASTY WITH STENT PLACEMENT    . RENAL ANGIOGRAM N/A 09/16/2011   Procedure: RENAL ANGIOGRAM;  Surgeon: Sherren Mocha, MD;  Location[facility] C[facility] LAB;  Service: Cardiovascular;  Laterality: N/A;    reports that she quit smoking about 9 years ago. She has never used smokeless tobacco. She reports that she does not drink alcohol or use drugs. family history includes Diabetes in her sister and son; Glaucoma in her father; Heart disease in her father. No Known Allergies Current Outpatient Prescriptions on File Prior to Visit  Medication Sig Dispense Refill  . acetaminophen (TYLENOL) 500 MG tablet Take 1-2 tablets (500-1,000 mg total) by mouth every 8 (eight) hours as needed for mild pain.    Marland Kitchen allopurinol (ZYLOPRIM) 100 MG tablet Take 100 mg by mouth daily.    Marland Kitchen amLODipine (NORVASC) 10 MG tablet TAKE ONE (1) TABLET EACH DAY 90 tablet 1  . aspirin 81 MG tablet Take 81 mg by mouth daily.    Marland Kitchen atorvastatin (LIPITOR) 80 MG tablet Take 1 tablet (80 mg total)  by mouth daily at 6 PM. 90 tablet 3  . BD PEN NEEDLE NANO U/F 32G X 4 MM MISC USE DAILY WITH LANTUS SOLASTAR 100 each 11  . blood glucose meter kit and supplies KIT Dispense based on patient and insurance preference. Use up to three times daily as directed. (FOR ICD-9 250.00, 250.01). 1 each 0  . Blood Glucose Monitoring Suppl (ACCU-CHEK AVIVA PLUS) w/Device KIT USE AS DIRECTED ONCE DAILY 1 kit 0  . calcitRIOL (ROCALTROL) 0.25 MCG capsule Take 0.25 mcg by mouth daily.    . clopidogrel (PLAVIX) 75 MG tablet Take 75 mg by mouth daily.    . ferrous sulfate 325 (65 FE) MG tablet TAKE 1 TABLET (325 MG TOTAL) BY MOUTH 2 (TWO) TIMES DAILY. 180 tablet 1  . furosemide (LASIX) 80 MG  tablet Take 0.5 tablets (40 mg total) by mouth 2 (two) times daily.    Marland Kitchen glucose blood test strip ACCU CHECK - Use as instructed once daily 100 each 12  . insulin aspart (NOVOLOG FLEXPEN) 100 UNIT/ML FlexPen Inject 0-9 Units into the skin 3 (three) times daily with meals. CBG < 70: eat or drink something sweet and recheck. CBG 70 - 120: 0 units CBG 121 - 150: 1 unit CBG 151 - 200: 2 units CBG 201 - 250: 3 units CBG 251 - 300: 5 units CBG 301 - 350: 7 units CBG 351 - 400 9 units CBG > 400 call MD. 15 mL 0  . Lancets MISC Use as directed three times daily with meals 100 each 3  . metoprolol (LOPRESSOR) 100 MG tablet TAKE 1 TABLET BY MOUTH TWICE A DAY 180 tablet 1  . nitroGLYCERIN (NITROSTAT) 0.4 MG SL tablet Place 0.4 mg under the tongue every 5 (five) minutes as needed for chest pain. Reported on 04/06/2016    . omeprazole (PRILOSEC) 20 MG capsule TAKE 1 CAPSULE (20 MG TOTAL) BY MOUTH DAILY. 90 capsule 1  . potassium chloride (K-DUR,KLOR-CON) 10 MEQ tablet Take 2 tablets (20 mEq total) by mouth daily. 180 tablet 3  . predniSONE (DELTASONE) 5 MG tablet Take 5 mg by mouth daily with breakfast.    . risedronate (ACTONEL) 150 MG tablet TAKE 1 TABLET BY MOUTH ONCE A MONTH-TAKE ON THE SAME DAY OF EACH MONTH. 1 tablet 5  . timolol (BETIMOL) 0.5 % ophthalmic solution Place 1 drop into both eyes daily.      No current facility-administered medications on file prior to visit.    Review of Systems  Constitutional: Negative for other unusual diaphoresis or sweats HENT: Negative for ear discharge or swelling Eyes: Negative for other worsening visual disturbances Respiratory: Negative for stridor or other swelling  Gastrointestinal: Negative for worsening distension or other blood Genitourinary: Negative for retention or other urinary change Musculoskeletal: Negative for other MSK pain or swelling Skin: Negative for color change or other new lesions Neurological: Negative for worsening tremors and  other numbness  Psychiatric/Behavioral: Negative for worsening agitation or other fatigue All other system neg per pt    Objective:   Physical Exam BP 124/88   Pulse 84   Temp 97.9 F (36.6 C) (Oral)   Ht _0  (1.549 m)  VS noted, not ill appearing Constitutional: Pt appears in NAD HENT: Head: NCAT.  Right Ear: External ear normal.  Left Ear: External ear normal.  Eyes: . Pupils are equal, round, and reactive to light. Conjunctivae and EOM are normal Nose: without d/c or deformity Neck: Neck supple. Gross normal ROM  Cardiovascular: Normal rate and regular rhythm.   Pulmonary/Chest: Effort normal and breath sounds without rales or wheezing.  Abd:  Soft, NT, ND, + BS, no organomegaly Spine: with chronic tender low midline lumbar without swelling or rash Neurological: Pt is alert. At baseline orientation, motor grossly intact Skin: Skin is warm. No rashes, other new lesions, no LE edema Psychiatric: Pt behavior is normal without agitation  No other exam findings Lab Results  Component Value Date   WBC 9.5 07/06/2017   HGB 11.4 (L) 07/06/2017   HCT 35.5 (L) 07/06/2017   PLT 395.0 07/06/2017   GLUCOSE 216 (H) 07/06/2017   CHOL 206 (H) 06/08/2017   TRIG 330.0 (H) 06/08/2017   HDL 24.70 (L) 06/08/2017   LDLDIRECT 105.0 06/08/2017   LDLCALC 61 03/13/2014   ALT 16 07/06/2017   AST 13 07/06/2017   NA 139 07/06/2017   K 4.3 07/06/2017   CL 104 07/06/2017   CREATININE 2.80 (H) 07/06/2017   BUN 64 (H) 07/06/2017   CO2 22 07/06/2017   TSH 3.28 06/08/2017   HGBA1C 5.4 06/08/2017   MICROALBUR <0.7 06/08/2017       Assessment & Plan:

## 2017-07-06 NOTE — Patient Instructions (Addendum)
You had the flu shot today  Please take all new medication as prescribed - the muscle relaxer as needed  Please continue all other medications as before, and refills have been done if requested - the tramadol  Please have the pharmacy call with any other refills you may need.  Please continue your efforts at being more active, low cholesterol diet, and weight control.  Please keep your appointments with your specialists as you may have planned  Please go to the LAB in the Basement (turn left off the elevator) for the tests to be done today  You will be contacted by phone if any changes need to be made immediately.  Otherwise, you will receive a letter about your results with an explanation, but please check with MyChart first.  Please remember to sign up for MyChart if you have not done so, as this will be important to you in the future with finding out test results, communicating by private email, and scheduling acute appointments online when needed.  Please return in 6 months, or sooner if needed

## 2017-07-07 NOTE — Assessment & Plan Note (Signed)
With recent hypoglycemia now improved on SSI only, to cont same tx

## 2017-07-07 NOTE — Assessment & Plan Note (Signed)
Chronic persistent with pain uncontrolled, no neuro changes or recent falls, denis dysuria or GI symptoms, for tramadol Prn, and trial zanaflex prn

## 2017-07-07 NOTE — Assessment & Plan Note (Signed)
Stable, for f/u with renal as planned

## 2017-07-08 ENCOUNTER — Other Ambulatory Visit: Payer: Self-pay | Admitting: Internal Medicine

## 2017-07-18 DIAGNOSIS — E1129 Type 2 diabetes mellitus with other diabetic kidney complication: Secondary | ICD-10-CM | POA: Diagnosis not present

## 2017-07-18 DIAGNOSIS — I129 Hypertensive chronic kidney disease with stage 1 through stage 4 chronic kidney disease, or unspecified chronic kidney disease: Secondary | ICD-10-CM | POA: Diagnosis not present

## 2017-07-18 DIAGNOSIS — D631 Anemia in chronic kidney disease: Secondary | ICD-10-CM | POA: Diagnosis not present

## 2017-07-18 DIAGNOSIS — N2581 Secondary hyperparathyroidism of renal origin: Secondary | ICD-10-CM | POA: Diagnosis not present

## 2017-07-18 DIAGNOSIS — N184 Chronic kidney disease, stage 4 (severe): Secondary | ICD-10-CM | POA: Diagnosis not present

## 2017-07-19 ENCOUNTER — Inpatient Hospital Stay: Admission: RE | Admit: 2017-07-19 | Payer: Medicare Other | Source: Ambulatory Visit

## 2017-07-20 ENCOUNTER — Ambulatory Visit (INDEPENDENT_AMBULATORY_CARE_PROVIDER_SITE_OTHER)
Admission: RE | Admit: 2017-07-20 | Discharge: 2017-07-20 | Disposition: A | Discharge status: Home / Self Care | Payer: Medicare Other | Source: Ambulatory Visit | Attending: Internal Medicine | Admitting: Internal Medicine

## 2017-07-20 DIAGNOSIS — E2839 Other primary ovarian failure: Secondary | ICD-10-CM

## 2017-07-21 ENCOUNTER — Encounter (HOSPITAL_COMMUNITY): Payer: Medicare Other

## 2017-07-21 ENCOUNTER — Encounter: Payer: Self-pay | Admitting: Internal Medicine

## 2017-07-21 DIAGNOSIS — M8580 Other specified disorders of bone density and structure, unspecified site: Secondary | ICD-10-CM | POA: Insufficient documentation

## 2017-07-24 ENCOUNTER — Encounter (HOSPITAL_COMMUNITY)
Admission: RE | Admit: 2017-07-24 | Discharge: 2017-07-24 | Disposition: A | Discharge status: Home / Self Care | Payer: Medicare Other | Source: Ambulatory Visit | Attending: Nephrology | Admitting: Nephrology

## 2017-07-24 DIAGNOSIS — D631 Anemia in chronic kidney disease: Secondary | ICD-10-CM | POA: Insufficient documentation

## 2017-07-24 DIAGNOSIS — N184 Chronic kidney disease, stage 4 (severe): Secondary | ICD-10-CM | POA: Diagnosis not present

## 2017-07-24 DIAGNOSIS — D638 Anemia in other chronic diseases classified elsewhere: Secondary | ICD-10-CM

## 2017-07-24 LAB — POCT HEMOGLOBIN-HEMACUE: HEMOGLOBIN: 12.1 g/dL (ref 12.0–15.0)

## 2017-07-24 MED ORDER — EPOETIN ALFA 10000 UNIT/ML IJ SOLN
20000.0000 [IU] | INTRAMUSCULAR | Status: DC
Start: 2017-07-24 — End: 2017-07-25

## 2017-07-31 ENCOUNTER — Ambulatory Visit (INDEPENDENT_AMBULATORY_CARE_PROVIDER_SITE_OTHER): Payer: Medicare Other | Admitting: Cardiovascular Disease

## 2017-07-31 ENCOUNTER — Encounter: Payer: Self-pay | Admitting: Cardiovascular Disease

## 2017-07-31 VITALS — BP 108/70 | HR 65 | Ht 61.0 in | Wt 181.1 lb

## 2017-07-31 DIAGNOSIS — R011 Cardiac murmur, unspecified: Secondary | ICD-10-CM

## 2017-07-31 DIAGNOSIS — I2510 Atherosclerotic heart disease of native coronary artery without angina pectoris: Secondary | ICD-10-CM | POA: Diagnosis not present

## 2017-07-31 NOTE — Progress Notes (Signed)
Cardiology Office Note Date:  08/02/2017   ID:  [name] 03/10/1946, MRN [id number]  PCP:  [name], MD  Cardiologist:  Sherren Mocha, MD    Chief Complaint  Patient presents with  . Follow-up   History of Present Illness: [name] is a 71 y.o. female who presents for follow-up of CAD, HTN, and hyperlipidemia.   She has a history of drug-eluting stent implantation in the circumflex in 2004. She also has known renal artery stenosis with occlusion of the left renal artery and an atrophic left kidney. Other comorbid conditions include chronic kidney disease, type 2 diabetes, hypertension, hyperlipidemia, and carotid stenosis. She was last seen in June 2017 for cardiology follow-up.  She is here alone today. She complains of shortness of breath with climbing stairs or with walking. This hasn't progressed since her last visit and states this is uncommon. No chest pain, chest pressure, or back discomfort with activity. Complains of dizziness/balance problems and states this occurs every 'once in a while.' No frank syncope. No leg swelling. No orthopnea or PND.   Review of hospital records from an admission in September 2018 showed acute kidney injury and hypoglycemia. Creatinine peaked at 3.63 mg/dL and improved to 2.8 mg/dL at discharge which is at about her baseline.   Past Medical History:  Diagnosis Date  . Anemia, iron deficiency   . Arthritis   . CAD (coronary artery disease)   . Carotid stenosis    Carotid US (02/2014):  Bilateral ICA 40-59%; > 50% L ECA; F/u 1 year  . Cataract    removed both eyes  . Chronic kidney disease    chronic renal failure  . DM2 (diabetes mellitus, type 2) ([place])   . GERD (gastroesophageal reflux disease)   . Glaucoma   . HLD (hyperlipidemia)   . HTN (hypertension)   . Hx of cardiovascular stress test    Lexiscan Myoview (03/24/14):  No ischemia, EF 73%, Normal  . Hypoglycemia 06/27/2017  . Mitral regurgitation   .  Osteopenia 07/21/2017  . Osteoporosis   . PUD (peptic ulcer disease)   . PVD (peripheral vascular disease) ([place])   . Shortness of breath    with exertion    Past Surgical History:  Procedure Laterality Date  . ABDOMINAL HYSTERECTOMY    . APPENDECTOMY    . AV FISTULA PLACEMENT Right 01/18/2013   Procedure: ARTERIOVENOUS (AV) FISTULA CREATION;  Surgeon: Rosetta Posner, MD;  Location: [facility];  Service: Vascular;  Laterality: Right;  Ultrasound guided  . COLONOSCOPY    . CORONARY ANGIOPLASTY WITH STENT PLACEMENT    . RENAL ANGIOGRAM N/A 09/16/2011   Procedure: RENAL ANGIOGRAM;  Surgeon: Sherren Mocha, MD;  Location: [facility] CATH LAB;  Service: Cardiovascular;  Laterality: N/A;    Current Outpatient Prescriptions  Medication Sig Dispense Refill  . acetaminophen (TYLENOL) 500 MG tablet Take 1-2 tablets (500-1,000 mg total) by mouth every 8 (eight) hours as needed for mild pain.    Marland Kitchen allopurinol (ZYLOPRIM) 100 MG tablet Take 100 mg by mouth daily.    Marland Kitchen amLODipine (NORVASC) 10 MG tablet Take 10 mg by mouth daily.    Marland Kitchen aspirin 81 MG tablet Take 81 mg by mouth daily.    Marland Kitchen atorvastatin (LIPITOR) 80 MG tablet Take 1 tablet (80 mg total) by mouth daily at 6 PM. 90 tablet 3  . BD PEN NEEDLE NANO U/F 32G X 4 MM MISC USE DAILY WITH LANTUS SOLASTAR 100 each 11  .  blood glucose meter kit and supplies KIT Dispense based on patient and insurance preference. Use up to three times daily as directed. (FOR ICD-9 250.00, 250.01). 1 each 0  . Blood Glucose Monitoring Suppl (ACCU-CHEK AVIVA PLUS) w/Device KIT USE AS DIRECTED ONCE DAILY 1 kit 0  . calcitRIOL (ROCALTROL) 0.25 MCG capsule Take 0.25 mcg by mouth daily.    . clopidogrel (PLAVIX) 75 MG tablet Take 75 mg by mouth daily.    . ferrous sulfate 325 (65 FE) MG tablet TAKE 1 TABLET (325 MG TOTAL) BY MOUTH 2 (TWO) TIMES DAILY. 180 tablet 1  . furosemide (LASIX) 80 MG tablet Take 0.5 tablets (40 mg total) by mouth 2 (two) times daily.    Marland Kitchen glucose blood test strip  ACCU CHECK - Use as instructed once daily 100 each 12  . HUMALOG KWIKPEN 100 UNIT/ML KiwkPen Inject 0-9 Units into the skin 3 (three) times daily. Per sliding scale CBG LESS THAN 70= EAT OR DRINK SWEET + RECHECK CBG 70-120=0 CBG 121-150=1 CBG 151-200=2 CBG 201-250=3 CBG 251-300=5 CBG 301-350=7 CBG 351-400=9 CBG GREATER THAN 400 CALL DOCTOR  0  . Lancets MISC Use as directed three times daily with meals 100 each 3  . metoprolol (LOPRESSOR) 100 MG tablet TAKE 1 TABLET BY MOUTH TWICE A DAY 180 tablet 1  . nitroGLYCERIN (NITROSTAT) 0.4 MG SL tablet Place 0.4 mg under the tongue every 5 (five) minutes as needed for chest pain. Reported on 04/06/2016    . omeprazole (PRILOSEC) 20 MG capsule TAKE 1 CAPSULE (20 MG TOTAL) BY MOUTH DAILY. 90 capsule 1  . potassium chloride SA (K-DUR,KLOR-CON) 20 MEQ tablet Take 20 mEq by mouth daily.    . timolol (BETIMOL) 0.5 % ophthalmic solution Place 1 drop into both eyes daily.     Marland Kitchen tiZANidine (ZANAFLEX) 4 MG tablet Take 1 tablet (4 mg total) by mouth every 6 (six) hours as needed for muscle spasms. 60 tablet 1  . traMADol (ULTRAM) 50 MG tablet Take 1 tablet (50 mg total) by mouth every 6 (six) hours as needed. 40 tablet 1  . zolpidem (AMBIEN) 5 MG tablet Take 5 mg by mouth at bedtime as needed for sleep.     No current facility-administered medications for this visit.     Allergies:   Patient has no known allergies.   Social History:  The patient  reports that she quit smoking about 9 years ago. She has never used smokeless tobacco. She reports that she does not drink alcohol or use drugs.   Family History:  The patient's family history includes Diabetes in her sister and son; Glaucoma in her father; Heart disease in her father.   ROS:  Please see the history of present illness.   All other systems are reviewed and negative.    PHYSICAL EXAM: VS:  BP 108/70   Pulse 65   Ht '5\' 1"'$  (1.549 m)   Wt 181 lb 1.9 oz (82.2 kg)   BMI 34.22 kg/m  , BMI Body mass index  is 34.22 kg/m. GEN: Well nourished, well developed, pleasant obese, elderly woman in no acute distress  HEENT: normal  Neck: no JVD, no masses. bilateral carotid bruits Cardiac: RRR with 2/6 harsh mid peaking systolic murmur at the RUSB, no diastolic murmur               Respiratory:  clear to auscultation bilaterally, normal work of breathing GI: soft, nontender, nondistended, + BS MS: no deformity or atrophy  Ext:  no pretibial edema, pedal pulses 2+= bilaterally Skin: warm and dry, no rash Neuro:  Strength and sensation are intact Psych: euthymic mood, full affect  EKG:  EKG is ordered today. The ekg ordered today shows NSR 65 bpm, nonspecific ST abnormality  Recent Labs: 06/08/2017: TSH 3.28 07/06/2017: ALT 16; BUN 64; Creatinine, Ser 2.80; Platelets 395.0; Potassium 4.3; Sodium 139 07/24/2017: Hemoglobin 12.1   Lipid Panel     Component Value Date/Time   CHOL 206 (H) 06/08/2017 1553   TRIG 330.0 (H) 06/08/2017 1553   HDL 24.70 (L) 06/08/2017 1553   CHOLHDL 8 06/08/2017 1553   VLDL 66.0 (H) 06/08/2017 1553   LDLCALC 61 03/13/2014 0957   LDLDIRECT 105.0 06/08/2017 1553      Wt Readings from Last 3 Encounters:  07/31/17 181 lb 1.9 oz (82.2 kg)  06/28/17 197 lb (89.4 kg)  06/08/17 194 lb (88 kg)     Cardiac Studies Reviewed: Nuclear stress scan 03/24/2014: QPS Raw Data Images: Normal; no motion artifact; normal heart/lung ratio. Stress Images: Normal homogeneous uptake in all areas of the myocardium. Rest Images: Normal homogeneous uptake in all areas of the myocardium. Subtraction (SDS): No evidence of ischemia. Transient Ischemic Dilatation (Normal <1.22): 1.08  Lung/Heart Ratio (Normal <0.45): 0.72  Quantitative Gated Spect Images QGS EDV: 77 ml QGS ESV: 21 ml  Impression Exercise Capacity: Lexiscan with low level exercise. BP Response: Normal blood pressure response. Clinical Symptoms: No significant symptoms noted. ECG Impression: No  significant ST segment change suggestive of ischemia. Comparison with Prior Nuclear Study: No images to compare  Overall Impression: Normal stress nuclear study.  LV Ejection Fraction: 73%. LV Wall Motion: NL LV Function; NL Wall Motion  ASSESSMENT AND PLAN: 1.  CAD, native vessel, with angina: minimal symptoms on her current regimen. Continue ASA, clopidogrel (first generation DES) with multiple risk factors (diabetes, CKD).   2. 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 Review of recent hospital labs as above.   3. Carotid stenosis without history of stroke: carotid doppler from 04-05-2016 reviewed with 40-59% bilateral ICA stenosis. Suspect carotid bruits related to external carotid disease.   4. Renal artery stenosis: atrophic left kidney with chronic occlusion of the left renal artery.  5. Hyperlipidemia: treated with atorvastatin.  6. Heart murmur: more prominent on exam than in past. No echo studies since 2011. Recommend updated echo - suspect aortic sclerosis/stenosis.   Current medicines are reviewed with the patient today.  The patient does not have concerns regarding medicines.  Labs/ tests ordered today include:   Orders Placed This Encounter  Procedures  . EKG 12-Lead  . ECHOCARDIOGRAM COMPLETE    Disposition:   FU one year  Signed, Sherren Mocha, MD  08/02/2017 11:14 PM    [facility] [place], [place],   [address] Phone: [phone]; Fax: [phone]

## 2017-07-31 NOTE — Patient Instructions (Signed)
Medication Instructions:  Your provider recommends that you continue on your current medications as directed. Please refer to the Current Medication list given to you today.    Labwork: None  Testing/Procedures: Your provider has requested that you have an echocardiogram. Echocardiography is a painless test that uses sound waves to create images of your heart. It provides your doctor with information about the size and shape of your heart and how well your heart's chambers and valves are working. This procedure takes approximately one hour. There are no restrictions for this procedure.  Follow-Up: Your provider wants you to follow-up in: 1 year with Dr. Cooper. You will receive a reminder letter in the mail two months in advance. If you don't receive a letter, please call our office to schedule the follow-up appointment.    Any Other Special Instructions Will Be Listed Below (If Applicable).     If you need a refill on your cardiac medications before your next appointment, please call your pharmacy.   

## 2017-08-08 ENCOUNTER — Emergency Department (HOSPITAL_COMMUNITY): Payer: Medicare Other

## 2017-08-08 ENCOUNTER — Inpatient Hospital Stay (HOSPITAL_COMMUNITY): Admission: RE | Admit: 2017-08-08 | Payer: Medicare Other | Source: Ambulatory Visit

## 2017-08-08 ENCOUNTER — Other Ambulatory Visit (HOSPITAL_COMMUNITY): Payer: Medicare Other

## 2017-08-08 ENCOUNTER — Emergency Department (HOSPITAL_COMMUNITY)
Admission: EM | Admit: 2017-08-08 | Discharge: 2017-08-08 | Disposition: A | Discharge status: Home / Self Care | Payer: Medicare Other | Attending: Emergency Medicine | Admitting: Emergency Medicine

## 2017-08-08 DIAGNOSIS — Z79899 Other long term (current) drug therapy: Secondary | ICD-10-CM | POA: Diagnosis not present

## 2017-08-08 DIAGNOSIS — R4182 Altered mental status, unspecified: Secondary | ICD-10-CM | POA: Diagnosis present

## 2017-08-08 DIAGNOSIS — Z794 Long term (current) use of insulin: Secondary | ICD-10-CM | POA: Diagnosis not present

## 2017-08-08 DIAGNOSIS — Z87891 Personal history of nicotine dependence: Secondary | ICD-10-CM | POA: Diagnosis not present

## 2017-08-08 DIAGNOSIS — E1122 Type 2 diabetes mellitus with diabetic chronic kidney disease: Secondary | ICD-10-CM | POA: Diagnosis not present

## 2017-08-08 DIAGNOSIS — N185 Chronic kidney disease, stage 5: Secondary | ICD-10-CM | POA: Diagnosis not present

## 2017-08-08 DIAGNOSIS — E162 Hypoglycemia, unspecified: Secondary | ICD-10-CM

## 2017-08-08 DIAGNOSIS — E11649 Type 2 diabetes mellitus with hypoglycemia without coma: Secondary | ICD-10-CM | POA: Diagnosis not present

## 2017-08-08 DIAGNOSIS — Z7982 Long term (current) use of aspirin: Secondary | ICD-10-CM | POA: Insufficient documentation

## 2017-08-08 DIAGNOSIS — R680 Hypothermia, not associated with low environmental temperature: Secondary | ICD-10-CM | POA: Insufficient documentation

## 2017-08-08 DIAGNOSIS — E876 Hypokalemia: Secondary | ICD-10-CM | POA: Diagnosis not present

## 2017-08-08 DIAGNOSIS — I129 Hypertensive chronic kidney disease with stage 1 through stage 4 chronic kidney disease, or unspecified chronic kidney disease: Secondary | ICD-10-CM | POA: Diagnosis not present

## 2017-08-08 DIAGNOSIS — N182 Chronic kidney disease, stage 2 (mild): Secondary | ICD-10-CM | POA: Diagnosis not present

## 2017-08-08 DIAGNOSIS — E161 Other hypoglycemia: Secondary | ICD-10-CM | POA: Diagnosis not present

## 2017-08-08 DIAGNOSIS — T68XXXA Hypothermia, initial encounter: Secondary | ICD-10-CM

## 2017-08-08 DIAGNOSIS — R0602 Shortness of breath: Secondary | ICD-10-CM | POA: Diagnosis not present

## 2017-08-08 LAB — COMPREHENSIVE METABOLIC PANEL
ALT: 13 U/L — ABNORMAL LOW (ref 14–54)
ANION GAP: 14 (ref 5–15)
AST: 18 U/L (ref 15–41)
Albumin: 3.6 g/dL (ref 3.5–5.0)
Alkaline Phosphatase: 172 U/L — ABNORMAL HIGH (ref 38–126)
BILIRUBIN TOTAL: 0.6 mg/dL (ref 0.3–1.2)
BUN: 34 mg/dL — ABNORMAL HIGH (ref 6–20)
CHLORIDE: 103 mmol/L (ref 101–111)
CO2: 19 mmol/L — ABNORMAL LOW (ref 22–32)
Calcium: 9.3 mg/dL (ref 8.9–10.3)
Creatinine, Ser: 2.22 mg/dL — ABNORMAL HIGH (ref 0.44–1.00)
GFR, EST AFRICAN AMERICAN: 24 mL/min — AB (ref >60)
GFR, EST NON AFRICAN AMERICAN: 21 mL/min — AB (ref >60)
Glucose, Bld: 70 mg/dL (ref 65–99)
POTASSIUM: 3.7 mmol/L (ref 3.5–5.1)
Sodium: 136 mmol/L (ref 135–145)
TOTAL PROTEIN: 7.4 g/dL (ref 6.5–8.1)

## 2017-08-08 LAB — CBG MONITORING, ED
GLUCOSE-CAPILLARY: 68 mg/dL (ref 65–99)
GLUCOSE-CAPILLARY: 93 mg/dL (ref 65–99)
GLUCOSE-CAPILLARY: 350 mg/dL — AB (ref 65–99)
GLUCOSE-CAPILLARY: 385 mg/dL — AB (ref 65–99)
Glucose-Capillary: 291 mg/dL — ABNORMAL HIGH (ref 65–99)
Glucose-Capillary: 363 mg/dL — ABNORMAL HIGH (ref 65–99)

## 2017-08-08 LAB — URINALYSIS, ROUTINE W REFLEX MICROSCOPIC
BILIRUBIN URINE: NEGATIVE
GLUCOSE, UA: NEGATIVE mg/dL
HGB URINE DIPSTICK: NEGATIVE
KETONES UR: NEGATIVE mg/dL
Leukocytes, UA: NEGATIVE
Nitrite: NEGATIVE
PH: 5 (ref 5.0–8.0)
Protein, ur: NEGATIVE mg/dL
SPECIFIC GRAVITY, URINE: 1.006 (ref 1.005–1.030)

## 2017-08-08 LAB — CBC WITH DIFFERENTIAL/PLATELET
BASOS ABS: 0 10*3/uL (ref 0.0–0.1)
Basophils Relative: 0 %
EOS PCT: 3 %
Eosinophils Absolute: 0.2 10*3/uL (ref 0.0–0.7)
HCT: 36.9 % (ref 36.0–46.0)
HEMOGLOBIN: 12.2 g/dL (ref 12.0–15.0)
LYMPHS ABS: 1.4 10*3/uL (ref 0.7–4.0)
LYMPHS PCT: 16 %
MCH: 27.7 pg (ref 26.0–34.0)
MCHC: 33.1 g/dL (ref 30.0–36.0)
MCV: 83.9 fL (ref 78.0–100.0)
Monocytes Absolute: 0.4 10*3/uL (ref 0.1–1.0)
Monocytes Relative: 4 %
NEUTROS ABS: 7.1 10*3/uL (ref 1.7–7.7)
NEUTROS PCT: 77 %
Platelets: 271 10*3/uL (ref 150–400)
RBC: 4.4 MIL/uL (ref 3.87–5.11)
RDW: 15.4 % (ref 11.5–15.5)
WBC: 9.2 10*3/uL (ref 4.0–10.5)

## 2017-08-08 LAB — ETHANOL: Alcohol, Ethyl (B): <10 mg/dL (ref <10)

## 2017-08-08 LAB — RAPID URINE DRUG SCREEN, HOSP PERFORMED
AMPHETAMINES: NOT DETECTED
BARBITURATES: NOT DETECTED
Benzodiazepines: NOT DETECTED
COCAINE: NOT DETECTED
OPIATES: NOT DETECTED
TETRAHYDROCANNABINOL: NOT DETECTED

## 2017-08-08 LAB — LACTIC ACID, PLASMA: Lactic Acid, Venous: 0.9 mmol/L (ref 0.5–1.9)

## 2017-08-08 LAB — AMMONIA: Ammonia: 37 umol/L — ABNORMAL HIGH (ref 9–35)

## 2017-08-08 MED ORDER — SODIUM CHLORIDE 0.9 % IV SOLN
INTRAVENOUS | Status: DC
  Administered 2017-08-08: 19:00:00 via INTRAVENOUS

## 2017-08-08 MED ORDER — HUMALOG KWIKPEN 100 UNIT/ML ~~LOC~~ SOPN: 0.0000 [IU] | PEN_INJECTOR | Freq: Two times a day (BID) | SUBCUTANEOUS | 0 refills | Status: DC

## 2017-08-08 MED ORDER — SODIUM CHLORIDE 0.9 % IV BOLUS (SEPSIS)
1000.0000 mL | Freq: Once | INTRAVENOUS | Status: AC
  Administered 2017-08-08: 1000 mL via INTRAVENOUS

## 2017-08-08 MED ORDER — DEXTROSE 50 % IV SOLN
1.0000 | Freq: Once | INTRAVENOUS | Status: AC
  Administered 2017-08-08: 50 mL via INTRAVENOUS
  Filled 2017-08-08: qty 50

## 2017-08-08 MED ORDER — DEXTROSE 10 % IV SOLN
INTRAVENOUS | Status: DC
  Administered 2017-08-08: 18:00:00 via INTRAVENOUS
  Filled 2017-08-08: qty 1000

## 2017-08-08 NOTE — ED Notes (Signed)
Bed: LP53 Expected date:  Expected time:  Means of arrival:  Comments: EMS-hyperglycemia

## 2017-08-08 NOTE — ED Notes (Signed)
ED Provider at bedside. 

## 2017-08-08 NOTE — ED Notes (Signed)
WARMING BLANKET APPLIED

## 2017-08-08 NOTE — ED Triage Notes (Signed)
Per [facility]- Pt resides at home. FULL CODE. CBG 40. 25 GRAMS D 10 GIVEN CBG 206. Mental status (in and out of consciousness). "eye opening only). CBG 137 during transport. 18# rt hand place by EMS.  EDP in room upon arrival.

## 2017-08-08 NOTE — ED Provider Notes (Signed)
[facility] DEPT Provider Note   CSN: [id number] Arrival date & time: 08/08/17  1638     History   Chief Complaint Chief Complaint  Patient presents with  . Hypoglycemia  . Altered Mental Status    HPI [name] is a 71 y.o. female.  Pt presents to the ED today with hypoglycemia and altered mental status.  The family found pt unresponsive and when EMS arrived, cbg 70.  Pt given 25 g D10 by EMS.  BS up to 137.  She is still altered.  EMS said she was diaphoretic when they arrived.  Pt has been unable to give any hx.  Pt admitted in September for the same thing.  Januvia and Actos stopped.  Pt on sliding scale insulin.      Past Medical History:  Diagnosis Date  . Anemia, iron deficiency   . Arthritis   . CAD (coronary artery disease)   . Carotid stenosis    Carotid US (02/2014):  Bilateral ICA 40-59%; > 50% L ECA; F/u 1 year  . Cataract    removed both eyes  . Chronic kidney disease    chronic renal failure  . DM2 (diabetes mellitus, type 2) ([place])   . GERD (gastroesophageal reflux disease)   . Glaucoma   . HLD (hyperlipidemia)   . HTN (hypertension)   . Hx of cardiovascular stress test    Lexiscan Myoview (03/24/14):  No ischemia, EF 73%, Normal  . Hypoglycemia 06/27/2017  . Mitral regurgitation   . Osteopenia 07/21/2017  . Osteoporosis   . PUD (peptic ulcer disease)   . PVD (peripheral vascular disease) ([place])   . Shortness of breath    with exertion    Patient Active Problem List   Diagnosis Date Noted  . Osteopenia 07/21/2017  . Hypoglycemia 06/27/2017  . Hypothermia 06/27/2017  . Diabetes mellitus with complication ([place]) 46/56/8127  . 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 Somnolence 06/27/2017  . AKI (acute kidney injury) ([place]) 06/27/2017  . Chronic pain 06/27/2017  . Bilateral hand numbness 06/08/2017  . Low back pain 03/09/2017  . Insomnia 12/27/2016  . Anemia of chronic disease  05/19/2016  . Second degree hemorrhoids 04/10/2016  . Aftercare following surgery of the circulatory system, [place] 03/26/2013  . Pain in joint, ankle and foot 03/13/2013  . End stage renal disease ([place]) 01/15/2013  . Bilateral foot pain 09/07/2012  . Sciatica of left side 03/10/2012  . Native stenosis of renal artery ([place]) 05/11/2011  . Renal artery stenosis ([place]) 04/25/2011  . Preventative health care 04/12/2011  . PERIPHERAL EDEMA 04/01/2010  . VITAMIN B12 DEFICIENCY 10/01/2009  . Nocturia 02/10/2009  . FATIGUE 12/18/2008  . Pain in joint, shoulder region 06/17/2008  . CERVICAL RADICULOPATHY 06/17/2008  . ANEMIA-IRON DEFICIENCY 12/13/2007  . PERIPHERAL VASCULAR DISEASE 12/13/2007  . Diabetes ([place]) 06/11/2007  . Hyperlipidemia 06/11/2007  . GLAUCOMA NOS 06/11/2007  . MITRAL REGURGITATION, 2 PLUS 06/11/2007  . Essential hypertension 06/11/2007  . CAD in native artery 06/11/2007  . CAROTID ARTERY STENOSIS 06/11/2007  . GERD 06/11/2007  . Peptic Ulcer, Site NOS 06/11/2007  . OSTEOPOROSIS 06/11/2007    Past Surgical History:  Procedure Laterality Date  . ABDOMINAL HYSTERECTOMY    . APPENDECTOMY    . AV FISTULA PLACEMENT Right 01/18/2013   Procedure: ARTERIOVENOUS (AV) FISTULA CREATION;  Surgeon: Rosetta Posner, MD;  Location: [facility];  Service: Vascular;  Laterality: Right;  Ultrasound guided  .  COLONOSCOPY    . CORONARY ANGIOPLASTY WITH STENT PLACEMENT    . RENAL ANGIOGRAM N/A 09/16/2011   Procedure: RENAL ANGIOGRAM;  Surgeon: Sherren Mocha, MD;  Location: [facility] CATH LAB;  Service: Cardiovascular;  Laterality: N/A;    OB History    No data available       Home Medications    Prior to Admission medications   Medication Sig Start Date End Date Taking? Authorizing Provider  acetaminophen (TYLENOL) 500 MG tablet Take 1-2 tablets (500-1,000 mg total) by mouth every 8 (eight) hours as needed for mild pain. 06/29/17  Yes Hongalgi, Lenis Dickinson, MD  allopurinol (ZYLOPRIM) 100 MG tablet  Take 100 mg by mouth daily.   Yes [provider]  amLODipine (NORVASC) 10 MG tablet Take 10 mg by mouth daily.   Yes [provider]  aspirin 81 MG tablet Take 81 mg by mouth daily.   Yes [provider]  atorvastatin (LIPITOR) 80 MG tablet Take 1 tablet (80 mg total) by mouth daily at 6 PM. 03/10/17  Yes [name], MD  calcitRIOL (ROCALTROL) 0.25 MCG capsule Take 0.25 mcg by mouth daily.   Yes [provider]  clopidogrel (PLAVIX) 75 MG tablet Take 75 mg by mouth daily.   Yes [provider]  Cyanocobalamin (B-12 PO) Take 1 tablet by mouth daily.   Yes [provider]  ferrous sulfate 325 (65 FE) MG tablet TAKE 1 TABLET (325 MG TOTAL) BY MOUTH 2 (TWO) TIMES DAILY. 12/27/16  Yes [name], MD  furosemide (LASIX) 80 MG tablet Take 0.5 tablets (40 mg total) by mouth 2 (two) times daily. 06/29/17  Yes Hongalgi, Lenis Dickinson, MD  metoprolol (LOPRESSOR) 100 MG tablet TAKE 1 TABLET BY MOUTH TWICE A DAY 02/27/17  Yes [name], MD  nitroGLYCERIN (NITROSTAT) 0.4 MG SL tablet Place 0.4 mg under the tongue every 5 (five) minutes as needed for chest pain. Reported on 04/06/2016   Yes [provider]  omeprazole (PRILOSEC) 20 MG capsule TAKE 1 CAPSULE (20 MG TOTAL) BY MOUTH DAILY. 04/10/17  Yes [name], MD  potassium chloride SA (K-DUR,KLOR-CON) 20 MEQ tablet Take 20 mEq by mouth daily.   Yes [provider]  Tetrahydrozoline HCl (VISINE OP) Place 2 drops into both eyes daily as needed (DRY EYE).   Yes [provider]  timolol (BETIMOL) 0.5 % ophthalmic solution Place 1 drop into both eyes daily.    Yes [provider]  tiZANidine (ZANAFLEX) 4 MG tablet Take 1 tablet (4 mg total) by mouth every 6 (six) hours as needed for muscle spasms. 07/06/17  Yes [name], MD  traMADol (ULTRAM) 50 MG tablet Take 1 tablet (50 mg total) by mouth every 6 (six) hours as needed. 07/06/17  Yes [name], MD  zolpidem (AMBIEN) 5  MG tablet Take 5 mg by mouth at bedtime as needed for sleep.   Yes [provider]  BD PEN NEEDLE NANO U/F 32G X 4 MM MISC USE DAILY WITH LANTUS SOLASTAR 05/20/14   [name], MD  blood glucose meter kit and supplies KIT Dispense based on patient and insurance preference. Use up to three times daily as directed. (FOR ICD-9 250.00, 250.01). 04/06/16   [name], MD  Blood Glucose Monitoring Suppl (ACCU-CHEK AVIVA PLUS) w/Device KIT USE AS DIRECTED ONCE DAILY 05/01/17   [name], MD  glucose blood test strip ACCU CHECK - Use as instructed once daily 03/09/17  [name], MD  HUMALOG KWIKPEN 100 UNIT/ML KiwkPen Inject 0-0.09 mLs (0-9 Units total) into the skin 2 (two) times daily. Per sliding scale CBG LESS THAN 70= EAT OR DRINK SWEET + RECHECK CBG 70-120=0 CBG 121-150=1 CBG 151-200=2 CBG 201-250=3 CBG 251-300=5 CBG 301-350=7 CBG 351-400=9 CBG GREATER THAN 400 CALL DOCTOR 08/08/17   Isla Pence, MD  Lancets MISC Use as directed three times daily with meals 03/09/17   [name], MD    Family History Family History  Problem Relation Age of Onset  . Heart disease Father   . Glaucoma Father   . Diabetes Sister   . Diabetes Son   . Colon cancer Neg Hx   . Colon polyps Neg Hx   . Esophageal cancer Neg Hx   . Rectal cancer Neg Hx   . Stomach cancer Neg Hx     Social History Social History  Substance Use Topics  . Smoking status: Former Smoker    Quit date: 10/25/2007  . Smokeless tobacco: Never Used     Comment: Stopped 1.5 yrs ago January 2009  . Alcohol use No     Allergies   Patient has no known allergies.   Review of Systems Review of Systems  Unable to perform ROS: Patient unresponsive     Physical Exam Updated Vital Signs BP 101/83 (BP Location: Left Arm)   Pulse 72   Temp (!) 97.5 F (36.4 C) (Oral)   Resp 15   Ht _0  (1.549 m)   Wt 82.1 kg (181 lb)   SpO2 97%   BMI 34.20 kg/m   Physical Exam  Constitutional: She appears  well-developed and well-nourished.  HENT:  Head: Normocephalic and atraumatic.  Right Ear: External ear normal.  Left Ear: External ear normal.  Nose: Nose normal.  Mouth/Throat: Oropharynx is clear and moist.  Eyes: Pupils are equal, round, and reactive to light. Conjunctivae and EOM are normal.  Neck: Normal range of motion. Neck supple.  Cardiovascular: Normal rate, regular rhythm, normal heart sounds and intact distal pulses.   Pulmonary/Chest: Effort normal and breath sounds normal.  Abdominal: Soft. Bowel sounds are normal.  Musculoskeletal: Normal range of motion.  Neurological:  Pt responsive to pain only.  Skin: Skin is warm.  Nursing note and vitals reviewed.    ED Treatments / Results  Labs (all labs ordered are listed, but only abnormal results are displayed) Labs Reviewed  COMPREHENSIVE METABOLIC PANEL - Abnormal; Notable for the following:       Result Value   CO2 19 (*)    BUN 34 (*)    Creatinine, Ser 2.22 (*)    ALT 13 (*)    Alkaline Phosphatase 172 (*)    GFR calc non Af Amer 21 (*)    GFR calc Af Amer 24 (*)    All other components within normal limits  AMMONIA - Abnormal; Notable for the following:    Ammonia 37 (*)    All other components within normal limits  URINALYSIS, ROUTINE W REFLEX MICROSCOPIC - Abnormal; Notable for the following:    Color, Urine STRAW (*)    All other components within normal limits  CBG MONITORING, ED - Abnormal; Notable for the following:    Glucose-Capillary 291 (*)    All other components within normal limits  CBG MONITORING, ED - Abnormal; Notable for the following:    Glucose-Capillary 363 (*)    All other components within normal limits  CBG MONITORING, ED -  Abnormal; Notable for the following:    Glucose-Capillary 350 (*)    All other components within normal limits  CBG MONITORING, ED - Abnormal; Notable for the following:    Glucose-Capillary 385 (*)    All other components within normal limits  CBC WITH  DIFFERENTIAL/PLATELET  LACTIC ACID, PLASMA  RAPID URINE DRUG SCREEN, HOSP PERFORMED  ETHANOL  CBG MONITORING, ED  CBG MONITORING, ED  CBG MONITORING, ED    EKG  EKG Interpretation  Date/Time:  Tuesday August 08 2017 16:50:50 EDT Ventricular Rate:  60 PR Interval:    QRS Duration: 100 QT Interval:  424 QTC Calculation: 424 R Axis:   31 Text Interpretation:  Sinus rhythm Anterior infarct, age indeterminate No significant change since last tracing Confirmed by Isla Pence [phone]) on 08/08/2017 5:01:33 PM       Radiology Ct Head Wo Contrast  Result Date: 08/08/2017 CLINICAL DATA:  Altered mental status and fever EXAM: CT HEAD WITHOUT CONTRAST TECHNIQUE: Contiguous axial images were obtained from the base of the skull through the vertex without intravenous contrast. COMPARISON:  Brain MRI 06/21/2008 FINDINGS: Brain: No mass lesion, intraparenchymal hemorrhage or extra-axial collection. No evidence of acute cortical infarct. Brain parenchyma and CSF-containing spaces are normal for age. Vascular: No hyperdense vessel or unexpected calcification. Skull: Normal visualized skull base, calvarium and extracranial soft tissues. Sinuses/Orbits: No sinus fluid levels or advanced mucosal thickening. No mastoid effusion. Normal orbits. IMPRESSION: Normal head CT for age. Electronically Signed   By: Ulyses Jarred M.D.   On: 08/08/2017 18:15   Dg Chest Port 1 View  Result Date: 08/08/2017 CLINICAL DATA:  Shortness of breath EXAM: PORTABLE CHEST 1 VIEW COMPARISON:  Chest radiograph 01/15/2013 FINDINGS: Cardiomegaly is unchanged. There is atelectasis of the right lung base. No focal consolidation or pulmonary edema. No pneumothorax or pleural effusion. IMPRESSION: Cardiomegaly without acute cardiopulmonary process. Electronically Signed   By: Ulyses Jarred M.D.   On: 08/08/2017 18:16    Procedures Procedures (including critical care time)  Medications Ordered in ED Medications  sodium  chloride 0.9 % bolus 1,000 mL (0 mLs Intravenous Stopped 08/08/17 1932)    And  0.9 %  sodium chloride infusion ( Intravenous New Bag/Given 08/08/17 1856)  dextrose 50 % solution 50 mL (50 mLs Intravenous Given 08/08/17 1730)     Initial Impression / Assessment and Plan / ED Course  I have reviewed the triage vital signs and the nursing notes.  Pertinent labs & imaging results that were available during my care of the patient were reviewed by me and considered in my medical decision making (see chart for details).    Pt is awake and alert now.  She is able to eat and drink.  She is at normal temperature.  She has been observed for 4 hours and has remained awake.  She is to not take any insulin tonight.  She is instructed to call her pcp regarding insulin usage.  She knows to return if worse.   Final Clinical Impressions(s) / ED Diagnoses   Final diagnoses:  Hypoglycemia  Hypothermia, initial encounter  CKD stage 5 due to type 2 diabetes mellitus [facility])    New Prescriptions Current Discharge Medication List       Isla Pence, MD 08/08/17 2040

## 2017-08-08 NOTE — Discharge Instructions (Signed)
Do not take insulin tonight.  Starting tomorrow, take sliding scale insulin twice a day.  Please call your doctor tomorrow to let him know of your low blood sugar and see if he has other recommendations regarding your insulin usage.

## 2017-08-11 ENCOUNTER — Encounter (HOSPITAL_COMMUNITY)
Admission: RE | Admit: 2017-08-11 | Discharge: 2017-08-11 | Disposition: A | Discharge status: Home / Self Care | Payer: Medicare Other | Source: Ambulatory Visit | Attending: Nephrology | Admitting: Nephrology

## 2017-08-11 DIAGNOSIS — N184 Chronic kidney disease, stage 4 (severe): Secondary | ICD-10-CM | POA: Diagnosis not present

## 2017-08-11 DIAGNOSIS — D638 Anemia in other chronic diseases classified elsewhere: Secondary | ICD-10-CM

## 2017-08-11 DIAGNOSIS — D631 Anemia in chronic kidney disease: Secondary | ICD-10-CM | POA: Diagnosis not present

## 2017-08-11 LAB — POCT HEMOGLOBIN-HEMACUE: HEMOGLOBIN: 11.7 g/dL — AB (ref 12.0–15.0)

## 2017-08-11 MED ORDER — EPOETIN ALFA 10000 UNIT/ML IJ SOLN: 20000.0000 [IU] | INTRAMUSCULAR | Status: DC

## 2017-08-11 MED ORDER — EPOETIN ALFA 20000 UNIT/ML IJ SOLN
INTRAMUSCULAR | Status: AC
  Administered 2017-08-11: 14:00:00 20000 [IU]
  Filled 2017-08-11: qty 1

## 2017-08-14 ENCOUNTER — Other Ambulatory Visit: Payer: Self-pay

## 2017-08-14 ENCOUNTER — Ambulatory Visit (HOSPITAL_COMMUNITY): Payer: Medicare Other | Attending: Cardiology

## 2017-08-14 DIAGNOSIS — I081 Rheumatic disorders of both mitral and tricuspid valves: Secondary | ICD-10-CM | POA: Diagnosis not present

## 2017-08-14 DIAGNOSIS — Z87891 Personal history of nicotine dependence: Secondary | ICD-10-CM | POA: Diagnosis not present

## 2017-08-14 DIAGNOSIS — I120 Hypertensive chronic kidney disease with stage 5 chronic kidney disease or end stage renal disease: Secondary | ICD-10-CM | POA: Insufficient documentation

## 2017-08-14 DIAGNOSIS — R011 Cardiac murmur, unspecified: Secondary | ICD-10-CM

## 2017-08-14 DIAGNOSIS — N186 End stage renal disease: Secondary | ICD-10-CM | POA: Diagnosis not present

## 2017-08-14 DIAGNOSIS — E785 Hyperlipidemia, unspecified: Secondary | ICD-10-CM | POA: Insufficient documentation

## 2017-08-14 DIAGNOSIS — E1151 Type 2 diabetes mellitus with diabetic peripheral angiopathy without gangrene: Secondary | ICD-10-CM | POA: Insufficient documentation

## 2017-08-14 DIAGNOSIS — E1122 Type 2 diabetes mellitus with diabetic chronic kidney disease: Secondary | ICD-10-CM | POA: Diagnosis not present

## 2017-08-14 DIAGNOSIS — I6529 Occlusion and stenosis of unspecified carotid artery: Secondary | ICD-10-CM | POA: Diagnosis not present

## 2017-08-14 DIAGNOSIS — I2510 Atherosclerotic heart disease of native coronary artery without angina pectoris: Secondary | ICD-10-CM | POA: Diagnosis not present

## 2017-08-18 ENCOUNTER — Other Ambulatory Visit: Payer: Self-pay | Admitting: Internal Medicine

## 2017-09-04 ENCOUNTER — Other Ambulatory Visit: Payer: Self-pay | Admitting: Internal Medicine

## 2017-09-15 ENCOUNTER — Inpatient Hospital Stay (HOSPITAL_COMMUNITY)
Admission: RE | Admit: 2017-09-15 | Discharge: 2017-09-15 | Disposition: A | Discharge status: Home / Self Care | Payer: Medicare Other | Source: Ambulatory Visit | Attending: Nephrology | Admitting: Nephrology

## 2017-09-15 ENCOUNTER — Encounter (HOSPITAL_COMMUNITY): Payer: Self-pay

## 2017-09-19 ENCOUNTER — Encounter (HOSPITAL_COMMUNITY)
Admission: RE | Admit: 2017-09-19 | Discharge: 2017-09-19 | Disposition: A | Discharge status: Home / Self Care | Payer: Medicare Other | Source: Ambulatory Visit | Attending: Nephrology | Admitting: Nephrology

## 2017-09-19 VITALS — BP 124/66 | HR 62 | Temp 98.1°F | Resp 20

## 2017-09-19 DIAGNOSIS — D631 Anemia in chronic kidney disease: Secondary | ICD-10-CM | POA: Diagnosis not present

## 2017-09-19 DIAGNOSIS — N184 Chronic kidney disease, stage 4 (severe): Secondary | ICD-10-CM | POA: Diagnosis not present

## 2017-09-19 DIAGNOSIS — D638 Anemia in other chronic diseases classified elsewhere: Secondary | ICD-10-CM

## 2017-09-19 LAB — IRON AND TIBC
Iron: 85 ug/dL (ref 28–170)
SATURATION RATIOS: 29 % (ref 10.4–31.8)
TIBC: 295 ug/dL (ref 250–450)
UIBC: 210 ug/dL

## 2017-09-19 LAB — FERRITIN: FERRITIN: 743 ng/mL — AB (ref 11–307)

## 2017-09-19 MED ORDER — EPOETIN ALFA 10000 UNIT/ML IJ SOLN: 20000.0000 [IU] | INTRAMUSCULAR | Status: DC

## 2017-09-20 LAB — POCT HEMOGLOBIN-HEMACUE: Hemoglobin: 12.4 g/dL (ref 12.0–15.0)

## 2017-10-03 ENCOUNTER — Encounter (HOSPITAL_COMMUNITY): Payer: Medicare Other

## 2017-10-04 ENCOUNTER — Other Ambulatory Visit: Payer: Self-pay | Admitting: Internal Medicine

## 2017-10-05 ENCOUNTER — Other Ambulatory Visit (HOSPITAL_COMMUNITY): Payer: Self-pay | Admitting: *Deleted

## 2017-10-06 ENCOUNTER — Encounter (HOSPITAL_COMMUNITY)
Admission: RE | Admit: 2017-10-06 | Discharge: 2017-10-06 | Disposition: A | Discharge status: Home / Self Care | Payer: Medicare Other | Source: Ambulatory Visit | Attending: Nephrology | Admitting: Nephrology

## 2017-10-06 VITALS — BP 144/49 | HR 75 | Temp 98.0°F | Resp 20

## 2017-10-06 DIAGNOSIS — N184 Chronic kidney disease, stage 4 (severe): Secondary | ICD-10-CM | POA: Insufficient documentation

## 2017-10-06 DIAGNOSIS — D638 Anemia in other chronic diseases classified elsewhere: Secondary | ICD-10-CM

## 2017-10-06 DIAGNOSIS — D631 Anemia in chronic kidney disease: Secondary | ICD-10-CM | POA: Diagnosis not present

## 2017-10-06 LAB — POCT HEMOGLOBIN-HEMACUE: HEMOGLOBIN: 12.2 g/dL (ref 12.0–15.0)

## 2017-10-06 MED ORDER — EPOETIN ALFA 20000 UNIT/ML IJ SOLN
INTRAMUSCULAR | Status: AC
  Filled 2017-10-06: qty 1

## 2017-10-06 MED ORDER — EPOETIN ALFA 10000 UNIT/ML IJ SOLN: 20000.0000 [IU] | INTRAMUSCULAR | Status: DC

## 2017-10-07 ENCOUNTER — Other Ambulatory Visit: Payer: Self-pay | Admitting: Internal Medicine

## 2017-10-13 ENCOUNTER — Telehealth: Payer: Self-pay | Admitting: Internal Medicine

## 2017-10-13 MED ORDER — HUMALOG KWIKPEN 100 UNIT/ML ~~LOC~~ SOPN: 0.0000 [IU] | PEN_INJECTOR | Freq: Two times a day (BID) | SUBCUTANEOUS | 1 refills | Status: DC

## 2017-10-13 NOTE — Telephone Encounter (Signed)
Copied from [place]. Topic: Quick Communication - Rx Refill/Question >> Oct 13, 2017  1:27 PM Carolyn Stare wrote: Has the patient contacted their pharmacy yes   PT SAID SHE IS DOWN TO HER LAST PEN   RX  HUMALOG KWIKPEN 100 UNIT/ML KiwkPen  Preferred Pharmacy (with phone number or street name)  [facility]    Agent: Please be advised that RX refills may take up to 3 business days. We ask that you follow-up with your pharmacy.

## 2017-10-13 NOTE — Telephone Encounter (Signed)
Reviewed chart pt is up-to-date sent refills to [facility]  

## 2017-10-19 DIAGNOSIS — E1122 Type 2 diabetes mellitus with diabetic chronic kidney disease: Secondary | ICD-10-CM | POA: Diagnosis not present

## 2017-10-19 DIAGNOSIS — D631 Anemia in chronic kidney disease: Secondary | ICD-10-CM | POA: Diagnosis not present

## 2017-10-19 DIAGNOSIS — N184 Chronic kidney disease, stage 4 (severe): Secondary | ICD-10-CM | POA: Diagnosis not present

## 2017-10-19 DIAGNOSIS — I129 Hypertensive chronic kidney disease with stage 1 through stage 4 chronic kidney disease, or unspecified chronic kidney disease: Secondary | ICD-10-CM | POA: Diagnosis not present

## 2017-10-19 DIAGNOSIS — N2581 Secondary hyperparathyroidism of renal origin: Secondary | ICD-10-CM | POA: Diagnosis not present

## 2017-11-01 ENCOUNTER — Other Ambulatory Visit: Payer: Self-pay | Admitting: Internal Medicine

## 2017-11-10 ENCOUNTER — Encounter (HOSPITAL_COMMUNITY)
Admission: RE | Admit: 2017-11-10 | Discharge: 2017-11-10 | Disposition: A | Discharge status: Home / Self Care | Payer: Medicare Other | Source: Ambulatory Visit | Attending: Nephrology | Admitting: Nephrology

## 2017-11-10 VITALS — BP 136/49 | HR 72 | Temp 98.2°F | Resp 20

## 2017-11-10 DIAGNOSIS — D631 Anemia in chronic kidney disease: Secondary | ICD-10-CM | POA: Insufficient documentation

## 2017-11-10 DIAGNOSIS — N184 Chronic kidney disease, stage 4 (severe): Secondary | ICD-10-CM | POA: Diagnosis not present

## 2017-11-10 DIAGNOSIS — D638 Anemia in other chronic diseases classified elsewhere: Secondary | ICD-10-CM

## 2017-11-10 LAB — POCT HEMOGLOBIN-HEMACUE: Hemoglobin: 11.6 g/dL — ABNORMAL LOW (ref 12.0–15.0)

## 2017-11-10 MED ORDER — EPOETIN ALFA 10000 UNIT/ML IJ SOLN
20000.0000 [IU] | INTRAMUSCULAR | Status: DC
Start: 2017-11-10 — End: 2017-11-11

## 2017-11-10 MED ORDER — EPOETIN ALFA 20000 UNIT/ML IJ SOLN
INTRAMUSCULAR | Status: AC
  Administered 2017-11-10: 13:00:00 20000 [IU] via SUBCUTANEOUS
  Filled 2017-11-10: qty 1

## 2017-11-15 ENCOUNTER — Ambulatory Visit (INDEPENDENT_AMBULATORY_CARE_PROVIDER_SITE_OTHER)
Admission: RE | Admit: 2017-11-15 | Discharge: 2017-11-15 | Disposition: A | Discharge status: Home / Self Care | Payer: Medicare Other | Source: Ambulatory Visit | Attending: Family | Admitting: Family

## 2017-11-15 ENCOUNTER — Encounter: Payer: Self-pay | Admitting: Family

## 2017-11-15 ENCOUNTER — Ambulatory Visit (INDEPENDENT_AMBULATORY_CARE_PROVIDER_SITE_OTHER): Payer: Medicare Other | Admitting: Family

## 2017-11-15 VITALS — BP 118/84 | HR 73 | Temp 98.1°F | Ht 61.0 in | Wt 197.0 lb

## 2017-11-15 DIAGNOSIS — R079 Chest pain, unspecified: Secondary | ICD-10-CM

## 2017-11-15 DIAGNOSIS — R1011 Right upper quadrant pain: Secondary | ICD-10-CM | POA: Diagnosis not present

## 2017-11-15 DIAGNOSIS — J9811 Atelectasis: Secondary | ICD-10-CM | POA: Diagnosis not present

## 2017-11-15 NOTE — Progress Notes (Signed)
[name] is a 72 y.o. female with the following history as recorded in EpicCare:  Patient Active Problem List   Diagnosis Date Noted  . Osteopenia 07/21/2017  . Hypoglycemia 06/27/2017  . Hypothermia 06/27/2017  . Diabetes mellitus with complication ([place]) 76/16/0737  . 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 Somnolence 06/27/2017  . AKI (acute kidney injury) ([place]) 06/27/2017  . Chronic pain 06/27/2017  . Bilateral hand numbness 06/08/2017  . Low back pain 03/09/2017  . Insomnia 12/27/2016  . Anemia of chronic disease 05/19/2016  . Second degree hemorrhoids 04/10/2016  . Aftercare following surgery of the circulatory system, [place] 03/26/2013  . Pain in joint, ankle and foot 03/13/2013  . End stage renal disease ([place]) 01/15/2013  . Bilateral foot pain 09/07/2012  . Sciatica of left side 03/10/2012  . Native stenosis of renal artery ([place]) 05/11/2011  . Renal artery stenosis ([place]) 04/25/2011  . Preventative health care 04/12/2011  . PERIPHERAL EDEMA 04/01/2010  . VITAMIN B12 DEFICIENCY 10/01/2009  . Nocturia 02/10/2009  . FATIGUE 12/18/2008  . Pain in joint, shoulder region 06/17/2008  . CERVICAL RADICULOPATHY 06/17/2008  . ANEMIA-IRON DEFICIENCY 12/13/2007  . PERIPHERAL VASCULAR DISEASE 12/13/2007  . Diabetes ([place]) 06/11/2007  . Hyperlipidemia 06/11/2007  . GLAUCOMA NOS 06/11/2007  . MITRAL REGURGITATION, 2 PLUS 06/11/2007  . Essential hypertension 06/11/2007  . CAD in native artery 06/11/2007  . CAROTID ARTERY STENOSIS 06/11/2007  . GERD 06/11/2007  . Peptic Ulcer, Site NOS 06/11/2007  . OSTEOPOROSIS 06/11/2007    Current Outpatient Medications  Medication Sig Dispense Refill  . acetaminophen (TYLENOL) 500 MG tablet Take 1-2 tablets (500-1,000 mg total) by mouth every 8 (eight) hours as needed for mild pain.    Marland Kitchen allopurinol (ZYLOPRIM) 100 MG tablet Take 100 mg by mouth daily.    Marland Kitchen amLODipine (NORVASC) 10 MG tablet TAKE ONE (1) TABLET  EACH DAY 90 tablet 1  . aspirin 81 MG tablet Take 81 mg by mouth daily.    Marland Kitchen atorvastatin (LIPITOR) 80 MG tablet Take 1 tablet (80 mg total) by mouth daily at 6 PM. 90 tablet 3  . BD PEN NEEDLE NANO U/F 32G X 4 MM MISC USE DAILY WITH LANTUS SOLASTAR 100 each 11  . blood glucose meter kit and supplies KIT Dispense based on patient and insurance preference. Use up to three times daily as directed. (FOR ICD-9 250.00, 250.01). 1 each 0  . Blood Glucose Monitoring Suppl (ACCU-CHEK AVIVA PLUS) w/Device KIT USE AS DIRECTED ONCE DAILY 1 kit 0  . calcitRIOL (ROCALTROL) 0.25 MCG capsule Take 0.25 mcg by mouth daily.    . clopidogrel (PLAVIX) 75 MG tablet Take 75 mg by mouth daily.    . Cyanocobalamin (B-12 PO) Take 1 tablet by mouth daily.    . ferrous sulfate 325 (65 FE) MG tablet TAKE 1 TABLET (325 MG TOTAL) BY MOUTH 2 (TWO) TIMES DAILY. 180 tablet 1  . furosemide (LASIX) 80 MG tablet Take 0.5 tablets (40 mg total) by mouth 2 (two) times daily.    Marland Kitchen glucose blood test strip ACCU CHECK - Use as instructed once daily 100 each 12  . HUMALOG KWIKPEN 100 UNIT/ML KiwkPen Inject 0-0.09 mLs (0-9 Units total) into the skin 2 (two) times daily. Per sliding scale CBG LESS THAN 70= EAT OR DRINK SWEET + RECHECK CBG 70-120=0 CBG 121-150=1 CBG 151-200=2 CBG 201-250=3 CBG 251-300=5 CBG 301-350=7 CBG 351-400=9 CBG GREATER THAN 400 CALL DOCTOR 15 mL  1  . Lancets MISC Use as directed three times daily with meals 100 each 3  . metoprolol tartrate (LOPRESSOR) 100 MG tablet TAKE 1 TABLET BY MOUTH TWICE A DAY 180 tablet 3  . nitroGLYCERIN (NITROSTAT) 0.4 MG SL tablet Place 0.4 mg under the tongue every 5 (five) minutes as needed for chest pain. Reported on 04/06/2016    . omeprazole (PRILOSEC) 20 MG capsule TAKE 1 CAPSULE (20 MG TOTAL) BY MOUTH DAILY. 90 capsule 1  . pioglitazone (ACTOS) 45 MG tablet TAKE 1 TABLET BY MOUTH EVERY DAY 90 tablet 3  . potassium chloride SA (K-DUR,KLOR-CON) 20 MEQ tablet Take 20 mEq by mouth daily.     . risedronate (ACTONEL) 150 MG tablet TAKE 1 TABLET BY MOUTH ONCE A MONTH-TAKE ON THE SAME DAY OF EACH MONTH. 1 tablet 5  . Tetrahydrozoline HCl (VISINE OP) Place 2 drops into both eyes daily as needed (DRY EYE).    Marland Kitchen timolol (BETIMOL) 0.5 % ophthalmic solution Place 1 drop into both eyes daily.     . timolol (TIMOPTIC) 0.5 % ophthalmic solution PLACE 1 DROP IN BOTH EYES DAILY  3  . tiZANidine (ZANAFLEX) 4 MG tablet TAKE 1 TABLET (4 MG TOTAL) BY MOUTH EVERY 6 (SIX) HOURS AS NEEDED FOR MUSCLE SPASMS. 60 tablet 1  . traMADol (ULTRAM) 50 MG tablet Take 1 tablet (50 mg total) by mouth every 6 (six) hours as needed. 40 tablet 1  . zolpidem (AMBIEN) 5 MG tablet Take 5 mg by mouth at bedtime as needed for sleep.     No current facility-administered medications for this visit.     Allergies: Patient has no known allergies.  Past Medical History:  Diagnosis Date  . Anemia, iron deficiency   . Arthritis   . CAD (coronary artery disease)   . Carotid stenosis    Carotid US (02/2014):  Bilateral ICA 40-59%; > 50% L ECA; F/u 1 year  . Cataract    removed both eyes  . Chronic kidney disease    chronic renal failure  . DM2 (diabetes mellitus, type 2) ([place])   . GERD (gastroesophageal reflux disease)   . Glaucoma   . HLD (hyperlipidemia)   . HTN (hypertension)   . Hx of cardiovascular stress test    Lexiscan Myoview (03/24/14):  No ischemia, EF 73%, Normal  . Hypoglycemia 06/27/2017  . Mitral regurgitation   . Osteopenia 07/21/2017  . Osteoporosis   . PUD (peptic ulcer disease)   . PVD (peripheral vascular disease) ([place])   . Shortness of breath    with exertion    Past Surgical History:  Procedure Laterality Date  . ABDOMINAL HYSTERECTOMY    . APPENDECTOMY    . AV FISTULA PLACEMENT Right 01/18/2013   Procedure: ARTERIOVENOUS (AV) FISTULA CREATION;  Surgeon: Rosetta Posner, MD;  Location: [facility];  Service: Vascular;  Laterality: Right;  Ultrasound guided  . COLONOSCOPY    . CORONARY ANGIOPLASTY  WITH STENT PLACEMENT    . RENAL ANGIOGRAM N/A 09/16/2011   Procedure: RENAL ANGIOGRAM;  Surgeon: Sherren Mocha, MD;  Location[facility] CATH LAB;  Service: Cardiovascular;  Laterality: N/A;    Family History  Problem Relation Age of Onset  . Heart disease Father   . Glaucoma Father   . Diabetes Sister   . Diabetes Son   . Colon cancer Neg Hx   . Colon polyps Neg Hx   . Esophageal cancer Neg Hx   . Rectal cancer Neg Hx   .  Stomach cancer Neg Hx     Social History   Tobacco Use  . Smoking status: Former Smoker    Last attempt to quit: 10/25/2007    Years since quitting: 10.0  . Smokeless tobacco: Never Used  . Tobacco comment: Stopped 1.5 yrs ago January 2009  Substance Use Topics  . Alcohol use: No    Subjective:  RUQ pain x 1 day- woke up with the pain; thought it was originally chest pain and took 2 Nitroglycerin yesterday ( 5 mins apart) with no relief; does not feel that food makes symptoms worse; feels that certain movements make the pain worse- noticeable with sitting to standing; no rash noted at area of concern; no changes in urinary habits- no blood in urine; no fever; no vomiting; "feels like I just have bad gas pains." Did have normal bowel movement last night;  Objective:  Vitals:   11/15/17 1422  BP: 118/84  Pulse: 73  Temp: 98.1 F (36.7 C)  TempSrc: Oral  SpO2: 98%  Weight: 197 lb 0.6 oz (89.4 kg)  Height: _0  (1.549 m)    General: Well developed, well nourished, in no acute distress  Skin : Warm and dry.  Head: Normocephalic and atraumatic  Lungs: Respirations unlabored; clear to auscultation bilaterally without wheeze, rales, rhonchi  CVS exam: normal rate and regular rhythm.  Abdomen: Soft; RUQ tender; nondistended; normoactive bowel sounds; no masses or hepatosplenomegaly  Musculoskeletal: No deformities; no active joint inflammation  Extremities: No edema, cyanosis, clubbing  Vessels: Symmetric bilaterally  Neurologic: Alert and oriented; speech  intact; face symmetrical; moves all extremities well; CNII-XII intact without focal deficit  Assessment:  1. Chest pain, unspecified type   2. RUQ pain     Plan:  Check EKG- no acute changes; question atypical presentation for shingles, gallbladder disease or muscular source; will update CXR today and abdominal ultrasound; will check CBC, CMP, Amylase, Lipase; follow-up to be determined.   No Follow-up on file.  Orders Placed This Encounter  Procedures  . DG Chest 2 View    Standing Status:   Future    Number of Occurrences:   1    Standing Expiration Date:   01/14/2019    Order Specific Question:   Reason for Exam (SYMPTOM  OR DIAGNOSIS REQUIRED)    Answer:   atypical chest pain    Order Specific Question:   Preferred imaging location?    Answer:   [facility]    Order Specific Question:   Radiology Contrast Protocol - do NOT remove file path    Answer:   [email or web address]  . US Abdomen Limited RUQ    Standing Status:   Future    Standing Expiration Date:   01/14/2019    Order Specific Question:   Reason for Exam (SYMPTOM  OR DIAGNOSIS REQUIRED)    Answer:   RUQ pain    Order Specific Question:   Preferred imaging location?    Answer:   [facility]  . CBC w/Diff    Standing Status:   Future    Standing Expiration Date:   11/15/2018  . Comprehensive metabolic panel    Standing Status:   Future    Standing Expiration Date:   11/15/2018  . Amylase    Standing Status:   Future    Standing Expiration Date:   11/15/2018  . Lipase    Standing Status:   Future    Standing Expiration Date:   11/15/2018  . EKG 12-Lead  Requested Prescriptions    No prescriptions requested or ordered in this encounter

## 2017-11-16 ENCOUNTER — Ambulatory Visit: Payer: Self-pay | Admitting: *Deleted

## 2017-11-16 ENCOUNTER — Other Ambulatory Visit: Payer: Self-pay

## 2017-11-16 ENCOUNTER — Telehealth: Payer: Self-pay | Admitting: Internal Medicine

## 2017-11-16 ENCOUNTER — Observation Stay (HOSPITAL_COMMUNITY): Payer: Medicare Other

## 2017-11-16 ENCOUNTER — Emergency Department (HOSPITAL_COMMUNITY): Payer: Medicare Other

## 2017-11-16 ENCOUNTER — Encounter (HOSPITAL_COMMUNITY): Payer: Self-pay

## 2017-11-16 ENCOUNTER — Inpatient Hospital Stay (HOSPITAL_COMMUNITY)
Admission: EM | Admit: 2017-11-16 | Discharge: 2017-11-18 | DRG: 690 | Disposition: A | Discharge status: Home / Self Care | Payer: Medicare Other | Attending: Family Medicine | Admitting: Family Medicine

## 2017-11-16 DIAGNOSIS — N281 Cyst of kidney, acquired: Secondary | ICD-10-CM | POA: Diagnosis present

## 2017-11-16 DIAGNOSIS — N184 Chronic kidney disease, stage 4 (severe): Secondary | ICD-10-CM

## 2017-11-16 DIAGNOSIS — I129 Hypertensive chronic kidney disease with stage 1 through stage 4 chronic kidney disease, or unspecified chronic kidney disease: Secondary | ICD-10-CM | POA: Diagnosis present

## 2017-11-16 DIAGNOSIS — K219 Gastro-esophageal reflux disease without esophagitis: Secondary | ICD-10-CM | POA: Diagnosis not present

## 2017-11-16 DIAGNOSIS — Z7902 Long term (current) use of antithrombotics/antiplatelets: Secondary | ICD-10-CM | POA: Diagnosis not present

## 2017-11-16 DIAGNOSIS — R1011 Right upper quadrant pain: Secondary | ICD-10-CM | POA: Diagnosis not present

## 2017-11-16 DIAGNOSIS — I2510 Atherosclerotic heart disease of native coronary artery without angina pectoris: Secondary | ICD-10-CM | POA: Diagnosis not present

## 2017-11-16 DIAGNOSIS — I340 Nonrheumatic mitral (valve) insufficiency: Secondary | ICD-10-CM | POA: Diagnosis present

## 2017-11-16 DIAGNOSIS — M8580 Other specified disorders of bone density and structure, unspecified site: Secondary | ICD-10-CM | POA: Diagnosis present

## 2017-11-16 DIAGNOSIS — H409 Unspecified glaucoma: Secondary | ICD-10-CM | POA: Diagnosis not present

## 2017-11-16 DIAGNOSIS — E1151 Type 2 diabetes mellitus with diabetic peripheral angiopathy without gangrene: Secondary | ICD-10-CM | POA: Diagnosis present

## 2017-11-16 DIAGNOSIS — Z83511 Family history of glaucoma: Secondary | ICD-10-CM

## 2017-11-16 DIAGNOSIS — E785 Hyperlipidemia, unspecified: Secondary | ICD-10-CM | POA: Diagnosis present

## 2017-11-16 DIAGNOSIS — N12 Tubulo-interstitial nephritis, not specified as acute or chronic: Secondary | ICD-10-CM | POA: Diagnosis not present

## 2017-11-16 DIAGNOSIS — D649 Anemia, unspecified: Secondary | ICD-10-CM | POA: Diagnosis present

## 2017-11-16 DIAGNOSIS — Z7982 Long term (current) use of aspirin: Secondary | ICD-10-CM

## 2017-11-16 DIAGNOSIS — Z90710 Acquired absence of both cervix and uterus: Secondary | ICD-10-CM

## 2017-11-16 DIAGNOSIS — Z9842 Cataract extraction status, left eye: Secondary | ICD-10-CM | POA: Diagnosis not present

## 2017-11-16 DIAGNOSIS — I700 Atherosclerosis of aorta: Secondary | ICD-10-CM | POA: Diagnosis not present

## 2017-11-16 DIAGNOSIS — Z79899 Other long term (current) drug therapy: Secondary | ICD-10-CM

## 2017-11-16 DIAGNOSIS — N10 Acute tubulo-interstitial nephritis: Secondary | ICD-10-CM

## 2017-11-16 DIAGNOSIS — E669 Obesity, unspecified: Secondary | ICD-10-CM | POA: Diagnosis present

## 2017-11-16 DIAGNOSIS — K828 Other specified diseases of gallbladder: Secondary | ICD-10-CM | POA: Diagnosis present

## 2017-11-16 DIAGNOSIS — N2889 Other specified disorders of kidney and ureter: Secondary | ICD-10-CM | POA: Diagnosis not present

## 2017-11-16 DIAGNOSIS — Z9841 Cataract extraction status, right eye: Secondary | ICD-10-CM

## 2017-11-16 DIAGNOSIS — M810 Age-related osteoporosis without current pathological fracture: Secondary | ICD-10-CM | POA: Diagnosis present

## 2017-11-16 DIAGNOSIS — Z87891 Personal history of nicotine dependence: Secondary | ICD-10-CM

## 2017-11-16 DIAGNOSIS — E118 Type 2 diabetes mellitus with unspecified complications: Secondary | ICD-10-CM | POA: Diagnosis not present

## 2017-11-16 DIAGNOSIS — K8020 Calculus of gallbladder without cholecystitis without obstruction: Secondary | ICD-10-CM | POA: Diagnosis not present

## 2017-11-16 DIAGNOSIS — Z833 Family history of diabetes mellitus: Secondary | ICD-10-CM

## 2017-11-16 DIAGNOSIS — Z6820 Body mass index (BMI) 20.0-20.9, adult: Secondary | ICD-10-CM

## 2017-11-16 DIAGNOSIS — Z8249 Family history of ischemic heart disease and other diseases of the circulatory system: Secondary | ICD-10-CM

## 2017-11-16 DIAGNOSIS — Z794 Long term (current) use of insulin: Secondary | ICD-10-CM

## 2017-11-16 DIAGNOSIS — Z8711 Personal history of peptic ulcer disease: Secondary | ICD-10-CM

## 2017-11-16 DIAGNOSIS — E1122 Type 2 diabetes mellitus with diabetic chronic kidney disease: Secondary | ICD-10-CM | POA: Diagnosis not present

## 2017-11-16 DIAGNOSIS — M5412 Radiculopathy, cervical region: Secondary | ICD-10-CM | POA: Diagnosis not present

## 2017-11-16 HISTORY — DX: Calculus of gallbladder without cholecystitis without obstruction: K80.20

## 2017-11-16 HISTORY — DX: Atherosclerosis of aorta: I70.0

## 2017-11-16 LAB — COMPREHENSIVE METABOLIC PANEL
ALT: 17 U/L (ref 14–54)
AST: 17 U/L (ref 15–41)
Albumin: 3.4 g/dL — ABNORMAL LOW (ref 3.5–5.0)
Alkaline Phosphatase: 137 U/L — ABNORMAL HIGH (ref 38–126)
Anion gap: 12 (ref 5–15)
BUN: 33 mg/dL — AB (ref 6–20)
CALCIUM: 9 mg/dL (ref 8.9–10.3)
CHLORIDE: 99 mmol/L — AB (ref 101–111)
CO2: 25 mmol/L (ref 22–32)
CREATININE: 2.44 mg/dL — AB (ref 0.44–1.00)
GFR calc Af Amer: 21 mL/min — ABNORMAL LOW (ref >60)
GFR calc non Af Amer: 19 mL/min — ABNORMAL LOW (ref >60)
GLUCOSE: 256 mg/dL — AB (ref 65–99)
Potassium: 4.2 mmol/L (ref 3.5–5.1)
SODIUM: 136 mmol/L (ref 135–145)
Total Bilirubin: 0.7 mg/dL (ref 0.3–1.2)
Total Protein: 7.1 g/dL (ref 6.5–8.1)

## 2017-11-16 LAB — URINALYSIS, ROUTINE W REFLEX MICROSCOPIC
Bilirubin Urine: NEGATIVE
Glucose, UA: NEGATIVE mg/dL
KETONES UR: NEGATIVE mg/dL
Nitrite: NEGATIVE
PH: 7 (ref 5.0–8.0)
Protein, ur: 30 mg/dL — AB
Specific Gravity, Urine: 1.005 (ref 1.005–1.030)

## 2017-11-16 LAB — LIPASE, BLOOD: LIPASE: 30 U/L (ref 11–51)

## 2017-11-16 LAB — CBC
HCT: 34.6 % — ABNORMAL LOW (ref 36.0–46.0)
Hemoglobin: 11.5 g/dL — ABNORMAL LOW (ref 12.0–15.0)
MCH: 27.6 pg (ref 26.0–34.0)
MCHC: 33.2 g/dL (ref 30.0–36.0)
MCV: 83.2 fL (ref 78.0–100.0)
PLATELETS: 305 10*3/uL (ref 150–400)
RBC: 4.16 MIL/uL (ref 3.87–5.11)
RDW: 15.6 % — AB (ref 11.5–15.5)
WBC: 10.2 10*3/uL (ref 4.0–10.5)

## 2017-11-16 MED ORDER — TETRAHYDROZOLINE HCL 0.05 % OP SOLN: 1.0000 [drp] | Freq: Every day | OPHTHALMIC | Status: DC | PRN

## 2017-11-16 MED ORDER — PANTOPRAZOLE SODIUM 40 MG PO TBEC
40.0000 mg | DELAYED_RELEASE_TABLET | Freq: Every day | ORAL | Status: DC
  Administered 2017-11-17 – 2017-11-18 (×2): 40 mg via ORAL
  Filled 2017-11-16 (×2): qty 1

## 2017-11-16 MED ORDER — TRAMADOL HCL 50 MG PO TABS
100.0000 mg | ORAL_TABLET | Freq: Two times a day (BID) | ORAL | Status: DC | PRN
  Administered 2017-11-18: 100 mg via ORAL
  Filled 2017-11-16: qty 2

## 2017-11-16 MED ORDER — CALCITRIOL 0.25 MCG PO CAPS
0.2500 ug | ORAL_CAPSULE | Freq: Every day | ORAL | Status: DC
  Administered 2017-11-17 – 2017-11-18 (×2): 0.25 ug via ORAL
  Filled 2017-11-16 (×2): qty 1

## 2017-11-16 MED ORDER — ACETAMINOPHEN 325 MG PO TABS: 650.0000 mg | ORAL_TABLET | Freq: Four times a day (QID) | ORAL | Status: DC | PRN

## 2017-11-16 MED ORDER — ATORVASTATIN CALCIUM 40 MG PO TABS
80.0000 mg | ORAL_TABLET | Freq: Every day | ORAL | Status: DC
  Administered 2017-11-17: 80 mg via ORAL
  Filled 2017-11-16: qty 2

## 2017-11-16 MED ORDER — AMLODIPINE BESYLATE 10 MG PO TABS
10.0000 mg | ORAL_TABLET | Freq: Every day | ORAL | Status: DC
  Administered 2017-11-17 – 2017-11-18 (×2): 10 mg via ORAL
  Filled 2017-11-16 (×2): qty 1

## 2017-11-16 MED ORDER — HYDRALAZINE HCL 20 MG/ML IJ SOLN: 5.0000 mg | Freq: Four times a day (QID) | INTRAMUSCULAR | Status: DC | PRN

## 2017-11-16 MED ORDER — FERROUS SULFATE 325 (65 FE) MG PO TABS
325.0000 mg | ORAL_TABLET | Freq: Two times a day (BID) | ORAL | Status: DC
  Administered 2017-11-17 – 2017-11-18 (×3): 325 mg via ORAL
  Filled 2017-11-16 (×3): qty 1

## 2017-11-16 MED ORDER — DEXTROSE 5 % IV SOLN
1.0000 g | Freq: Once | INTRAVENOUS | Status: AC
  Administered 2017-11-16: 1 g via INTRAVENOUS
  Filled 2017-11-16: qty 10

## 2017-11-16 MED ORDER — ZOLPIDEM TARTRATE 5 MG PO TABS: 5.0000 mg | ORAL_TABLET | Freq: Every evening | ORAL | Status: DC | PRN

## 2017-11-16 MED ORDER — TIZANIDINE HCL 4 MG PO TABS: 4.0000 mg | ORAL_TABLET | Freq: Four times a day (QID) | ORAL | Status: DC | PRN

## 2017-11-16 MED ORDER — TIMOLOL MALEATE 0.5 % OP SOLN
1.0000 [drp] | Freq: Every day | OPHTHALMIC | Status: DC
  Administered 2017-11-17 – 2017-11-18 (×2): 1 [drp] via OPHTHALMIC
  Filled 2017-11-16: qty 5

## 2017-11-16 MED ORDER — ENOXAPARIN SODIUM 30 MG/0.3ML ~~LOC~~ SOLN
30.0000 mg | SUBCUTANEOUS | Status: DC
  Administered 2017-11-16 – 2017-11-17 (×2): 30 mg via SUBCUTANEOUS
  Filled 2017-11-16 (×2): qty 0.3

## 2017-11-16 MED ORDER — METOPROLOL TARTRATE 50 MG PO TABS
100.0000 mg | ORAL_TABLET | Freq: Two times a day (BID) | ORAL | Status: DC
  Administered 2017-11-16 – 2017-11-18 (×4): 100 mg via ORAL
  Filled 2017-11-16 (×4): qty 2

## 2017-11-16 MED ORDER — ALLOPURINOL 100 MG PO TABS
100.0000 mg | ORAL_TABLET | Freq: Every day | ORAL | Status: DC
  Administered 2017-11-17 – 2017-11-18 (×2): 100 mg via ORAL
  Filled 2017-11-16 (×2): qty 1

## 2017-11-16 MED ORDER — ASPIRIN EC 81 MG PO TBEC
81.0000 mg | DELAYED_RELEASE_TABLET | Freq: Every day | ORAL | Status: DC
  Administered 2017-11-17 – 2017-11-18 (×2): 81 mg via ORAL
  Filled 2017-11-16 (×2): qty 1

## 2017-11-16 MED ORDER — POTASSIUM CHLORIDE CRYS ER 20 MEQ PO TBCR
20.0000 meq | EXTENDED_RELEASE_TABLET | Freq: Every day | ORAL | Status: DC
  Administered 2017-11-17 – 2017-11-18 (×2): 20 meq via ORAL
  Filled 2017-11-16 (×2): qty 1

## 2017-11-16 MED ORDER — FENTANYL CITRATE (PF) 100 MCG/2ML IJ SOLN
50.0000 ug | Freq: Once | INTRAMUSCULAR | Status: AC
  Administered 2017-11-16: 50 ug via INTRAVENOUS
  Filled 2017-11-16: qty 2

## 2017-11-16 MED ORDER — FUROSEMIDE 40 MG PO TABS
40.0000 mg | ORAL_TABLET | Freq: Two times a day (BID) | ORAL | Status: DC
  Administered 2017-11-16 – 2017-11-18 (×4): 40 mg via ORAL
  Filled 2017-11-16 (×4): qty 1

## 2017-11-16 MED ORDER — HYDROMORPHONE HCL 1 MG/ML IJ SOLN
0.5000 mg | INTRAMUSCULAR | Status: DC | PRN
  Administered 2017-11-17: 0.5 mg via INTRAVENOUS
  Filled 2017-11-16: qty 0.5

## 2017-11-16 MED ORDER — SODIUM CHLORIDE 0.9 % IV SOLN
INTRAVENOUS | Status: DC
  Administered 2017-11-16: 22:00:00 via INTRAVENOUS

## 2017-11-16 MED ORDER — CLOPIDOGREL BISULFATE 75 MG PO TABS
75.0000 mg | ORAL_TABLET | Freq: Every day | ORAL | Status: DC
  Administered 2017-11-17 – 2017-11-18 (×2): 75 mg via ORAL
  Filled 2017-11-16 (×2): qty 1

## 2017-11-16 MED ORDER — INSULIN ASPART 100 UNIT/ML ~~LOC~~ SOLN
0.0000 [IU] | Freq: Three times a day (TID) | SUBCUTANEOUS | Status: DC
  Administered 2017-11-17: 1 [IU] via SUBCUTANEOUS
  Administered 2017-11-17: 2 [IU] via SUBCUTANEOUS
  Administered 2017-11-17: 3 [IU] via SUBCUTANEOUS
  Administered 2017-11-18: 2 [IU] via SUBCUTANEOUS
  Administered 2017-11-18: 3 [IU] via SUBCUTANEOUS

## 2017-11-16 MED ORDER — DEXTROSE 5 % IV SOLN
1.0000 g | INTRAVENOUS | Status: DC
  Administered 2017-11-17: 1 g via INTRAVENOUS
  Filled 2017-11-16 (×2): qty 10

## 2017-11-16 MED ORDER — ACETAMINOPHEN 650 MG RE SUPP: 650.0000 mg | Freq: Four times a day (QID) | RECTAL | Status: DC | PRN

## 2017-11-16 MED ORDER — INSULIN ASPART 100 UNIT/ML ~~LOC~~ SOLN
0.0000 [IU] | Freq: Every day | SUBCUTANEOUS | Status: DC
  Administered 2017-11-17: 2 [IU] via SUBCUTANEOUS

## 2017-11-16 NOTE — Telephone Encounter (Signed)
Ok to forward to provider who saw her - L Murray/thanks

## 2017-11-16 NOTE — ED Notes (Signed)
Pt reports RUQ pain that started Sunday with nausea and dry heaves.  Denies diarrhea.  Pt reports pain radiates around to her R flank area and her shoulder.  Denies any urinary sxs.  Positive for diffuse abd pain, worse in her RUQ.  No change in the severity of pain when eating, states the pain stays the same.

## 2017-11-16 NOTE — ED Notes (Signed)
Back from MRI.

## 2017-11-16 NOTE — ED Notes (Signed)
Patient transported to MRI 

## 2017-11-16 NOTE — ED Provider Notes (Addendum)
[facility] DEPT Provider Note   CSN: [id number] Arrival date & time: 11/16/17  1237     History   Chief Complaint Chief Complaint  Patient presents with  . Abdominal Pain    HPI [name] is a 72 y.o. female.  HPI Patient presents with right flank abdominal pain.  Began a few days ago.  Right abdomen radiates to her back.  No nausea or vomiting.  Not worse with eating.  Saw her primary care doctor yesterday who ordered some lab work but is not returned.  Also ultrasound has been ordered.  Continued pain.  Chest x-ray done yesterday and did not show a cause.  Patient states when it started she thought it was gas but it is not cleared up.  No fevers or chills.  No dysuria. Past Medical History:  Diagnosis Date  . Anemia, iron deficiency   . Arthritis   . CAD (coronary artery disease)   . Carotid stenosis    Carotid US (02/2014):  Bilateral ICA 40-59%; > 50% L ECA; F/u 1 year  . Cataract    removed both eyes  . Chronic kidney disease    chronic renal failure  . DM2 (diabetes mellitus, type 2) ([place])   . GERD (gastroesophageal reflux disease)   . Glaucoma   . HLD (hyperlipidemia)   . HTN (hypertension)   . Hx of cardiovascular stress test    Lexiscan Myoview (03/24/14):  No ischemia, EF 73%, Normal  . Hypoglycemia 06/27/2017  . Mitral regurgitation   . Osteopenia 07/21/2017  . Osteoporosis   . PUD (peptic ulcer disease)   . PVD (peripheral vascular disease) ([place])   . Shortness of breath    with exertion    Patient Active Problem List   Diagnosis Date Noted  . Osteopenia 07/21/2017  . Hypoglycemia 06/27/2017  . Hypothermia 06/27/2017  . Diabetes mellitus with complication ([place]) 95/63/8756  . 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 Somnolence 06/27/2017  . AKI (acute kidney injury) ([place]) 06/27/2017  . Chronic pain 06/27/2017  . Bilateral hand numbness 06/08/2017  . Low back pain 03/09/2017  . Insomnia  12/27/2016  . Anemia of chronic disease 05/19/2016  . Second degree hemorrhoids 04/10/2016  . Aftercare following surgery of the circulatory system, [place] 03/26/2013  . Pain in joint, ankle and foot 03/13/2013  . End stage renal disease ([place]) 01/15/2013  . Bilateral foot pain 09/07/2012  . Sciatica of left side 03/10/2012  . Native stenosis of renal artery ([place]) 05/11/2011  . Renal artery stenosis ([place]) 04/25/2011  . Preventative health care 04/12/2011  . PERIPHERAL EDEMA 04/01/2010  . VITAMIN B12 DEFICIENCY 10/01/2009  . Nocturia 02/10/2009  . FATIGUE 12/18/2008  . Pain in joint, shoulder region 06/17/2008  . CERVICAL RADICULOPATHY 06/17/2008  . ANEMIA-IRON DEFICIENCY 12/13/2007  . PERIPHERAL VASCULAR DISEASE 12/13/2007  . Diabetes ([place]) 06/11/2007  . Hyperlipidemia 06/11/2007  . GLAUCOMA NOS 06/11/2007  . MITRAL REGURGITATION, 2 PLUS 06/11/2007  . Essential hypertension 06/11/2007  . CAD in native artery 06/11/2007  . CAROTID ARTERY STENOSIS 06/11/2007  . GERD 06/11/2007  . Peptic Ulcer, Site NOS 06/11/2007  . OSTEOPOROSIS 06/11/2007    Past Surgical History:  Procedure Laterality Date  . ABDOMINAL HYSTERECTOMY    . APPENDECTOMY    . AV FISTULA PLACEMENT Right 01/18/2013   Procedure: ARTERIOVENOUS (AV) FISTULA CREATION;  Surgeon: Rosetta Posner, MD;  Location: [facility];  Service: Vascular;  Laterality: Right;  Ultrasound guided  . COLONOSCOPY    . CORONARY ANGIOPLASTY WITH STENT PLACEMENT    . RENAL ANGIOGRAM N/A 09/16/2011   Procedure: RENAL ANGIOGRAM;  Surgeon: Sherren Mocha, MD;  Location: [facility] C[facility] LAB;  Service: Cardiovascular;  Laterality: N/A;    OB History    No data available       Home Medications    Prior to Admission medications   Medication Sig Start Date End Date Taking? Authorizing Provider  acetaminophen (TYLENOL) 500 MG tablet Take 1-2 tablets (500-1,000 mg total) by mouth every 8 (eight) hours as needed for mild pain. 06/29/17  Yes Hongalgi, Lenis Dickinson, MD    allopurinol (ZYLOPRIM) 100 MG tablet Take 100 mg by mouth daily.   Yes [provider]  amLODipine (NORVASC) 10 MG tablet TAKE ONE (1) TABLET EACH DAY 10/04/17  Yes [name], MD  aspirin 81 MG tablet Take 81 mg by mouth daily.   Yes [provider]  atorvastatin (LIPITOR) 80 MG tablet Take 1 tablet (80 mg total) by mouth daily at 6 PM. 03/10/17  Yes [name], MD  calcitRIOL (ROCALTROL) 0.25 MCG capsule Take 0.25 mcg by mouth daily.   Yes [provider]  clopidogrel (PLAVIX) 75 MG tablet Take 75 mg by mouth daily.   Yes [provider]  Cyanocobalamin (B-12 PO) Take 1 tablet by mouth daily.   Yes [provider]  ferrous sulfate 325 (65 FE) MG tablet TAKE 1 TABLET (325 MG TOTAL) BY MOUTH 2 (TWO) TIMES DAILY. 12/27/16  Yes [name], MD  furosemide (LASIX) 80 MG tablet Take 0.5 tablets (40 mg total) by mouth 2 (two) times daily. 06/29/17  Yes Hongalgi, Lenis Dickinson, MD  HUMALOG KWIKPEN 100 UNIT/ML KiwkPen Inject 0-0.09 mLs (0-9 Units total) into the skin 2 (two) times daily. Per sliding scale CBG LESS THAN 70= EAT OR DRINK SWEET + RECHECK CBG 70-120=0 CBG 121-150=1 CBG 151-200=2 CBG 201-250=3 CBG 251-300=5 CBG 301-350=7 CBG 351-400=9 CBG GREATER THAN 400 CALL DOCTOR 10/13/17  Yes [name], MD  metoprolol tartrate (LOPRESSOR) 100 MG tablet TAKE 1 TABLET BY MOUTH TWICE A DAY 08/19/17  Yes [name], MD  omeprazole (PRILOSEC) 20 MG capsule TAKE 1 CAPSULE (20 MG TOTAL) BY MOUTH DAILY. 10/04/17  Yes [name], MD  potassium chloride SA (K-DUR,KLOR-CON) 20 MEQ tablet Take 20 mEq by mouth daily.   Yes [provider]  risedronate (ACTONEL) 150 MG tablet TAKE 1 TABLET BY MOUTH ONCE A MONTH-TAKE ON THE SAME DAY OF EACH MONTH. 11/01/17  Yes [name], MD  Tetrahydrozoline HCl (VISINE OP) Place 2 drops into both eyes daily as needed (DRY EYE).   Yes [provider]  timolol (BETIMOL) 0.5 % ophthalmic solution Place 1 drop into  both eyes daily.    Yes [provider]  timolol (TIMOPTIC) 0.5 % ophthalmic solution PLACE 1 DROP IN BOTH EYES DAILY 11/08/17  Yes [provider]  tiZANidine (ZANAFLEX) 4 MG tablet TAKE 1 TABLET (4 MG TOTAL) BY MOUTH EVERY 6 (SIX) HOURS AS NEEDED FOR MUSCLE SPASMS. 10/09/17  Yes [name], MD  traMADol (ULTRAM) 50 MG tablet Take 1 tablet (50 mg total) by mouth every 6 (six) hours as needed. 07/06/17  Yes [name], MD  zolpidem (AMBIEN) 5 MG tablet Take 5 mg by mouth at bedtime as needed for sleep.   Yes [provider]  BD PEN NEEDLE NANO U/F 32G X 4 MM  MISC USE DAILY WITH LANTUS SOLASTAR 05/20/14   [name], MD  blood glucose meter kit and supplies KIT Dispense based on patient and insurance preference. Use up to three times daily as directed. (FOR ICD-9 250.00, 250.01). 04/06/16   [name], MD  Blood Glucose Monitoring Suppl (ACCU-CHEK AVIVA PLUS) w/Device KIT USE AS DIRECTED ONCE DAILY 05/01/17   [name], MD  glucose blood test strip ACCU CHECK - Use as instructed once daily 03/09/17   [name], MD  Lancets MISC Use as directed three times daily with meals 03/09/17   [name], MD  nitroGLYCERIN (NITROSTAT) 0.4 MG SL tablet Place 0.4 mg under the tongue every 5 (five) minutes as needed for chest pain. Reported on 04/06/2016    [provider]  pioglitazone (ACTOS) 45 MG tablet TAKE 1 TABLET BY MOUTH EVERY DAY Patient not taking: Reported on 11/16/2017 08/19/17   [name], MD    Family History Family History  Problem Relation Age of Onset  . Heart disease Father   . Glaucoma Father   . Diabetes Sister   . Diabetes Son   . Colon cancer Neg Hx   . Colon polyps Neg Hx   . Esophageal cancer Neg Hx   . Rectal cancer Neg Hx   . Stomach cancer Neg Hx     Social History Social History   Tobacco Use  . Smoking status: Former Smoker    Last attempt to quit: 10/25/2007    Years since quitting: 10.0  . Smokeless tobacco: Never  Used  . Tobacco comment: Stopped 1.5 yrs ago January 2009  Substance Use Topics  . Alcohol use: No  . Drug use: No     Allergies   Patient has no known allergies.   Review of Systems Review of Systems  Constitutional: Negative for appetite change and fever.  HENT: Negative for congestion.   Respiratory: Negative for cough and shortness of breath.   Cardiovascular: Negative for chest pain.  Gastrointestinal: Positive for abdominal pain. Negative for constipation, nausea and vomiting.  Genitourinary: Positive for flank pain. Negative for frequency.  Musculoskeletal: Positive for back pain.  Skin: Negative for rash.  Neurological: Negative for numbness.  Psychiatric/Behavioral: Negative for confusion.     Physical Exam Updated Vital Signs BP (!) 177/63 (BP Location: Left Arm)   Pulse 72   Temp 97.9 F (36.6 C) (Oral)   Resp 18   Ht '5\' 1"'$  (1.549 m)   Wt 48.5 kg (107 lb)   SpO2 97%   BMI 20.22 kg/m   Physical Exam  Constitutional: She appears well-developed.  Abdominal:  Right upper quadrant and right CVA tenderness.  No mass.  No skin changes.     ED Treatments / Results  Labs (all labs ordered are listed, but only abnormal results are displayed) Labs Reviewed  COMPREHENSIVE METABOLIC PANEL - Abnormal; Notable for the following components:      Result Value   Chloride 99 (*)    Glucose, Bld 256 (*)    BUN 33 (*)    Creatinine, Ser 2.44 (*)    Albumin 3.4 (*)    Alkaline Phosphatase 137 (*)    GFR calc non Af Amer 19 (*)    GFR calc Af Amer 21 (*)    All other components within normal limits  CBC - Abnormal; Notable for the following components:   Hemoglobin 11.5 (*)    HCT 34.6 (*)    RDW  15.6 (*)    All other components within normal limits  URINALYSIS, ROUTINE W REFLEX MICROSCOPIC - Abnormal; Notable for the following components:   APPearance HAZY (*)    Hgb urine dipstick SMALL (*)    Protein, ur 30 (*)    Leukocytes, UA LARGE (*)    Bacteria, UA  FEW (*)    Squamous Epithelial / LPF 6-30 (*)    All other components within normal limits  LIPASE, BLOOD    EKG  EKG Interpretation None       Radiology Dg Chest 2 View  Result Date: 11/15/2017 CLINICAL DATA:  Pain under LEFT breast for 2 days, history hypertension, diabetes mellitus, coronary disease post stenting, former smoker EXAM: CHEST  2 VIEW COMPARISON:  08/08/2017 FINDINGS: Enlargement of cardiac silhouette. Coronary arterial stent noted. Atherosclerotic calcification aorta. Mediastinal contours and pulmonary vascularity normal. Mild bibasilar atelectasis greater on LEFT. Lungs otherwise clear. No infiltrate, pleural effusion or pneumothorax. Bones unremarkable. IMPRESSION: Bibasilar atelectasis greater on LEFT. Enlargement of cardiac silhouette. Aortic atherosclerosis. Electronically Signed   By: Lavonia Dana M.D.   On: 11/15/2017 17:46   US Abdomen Complete  Result Date: 11/16/2017 CLINICAL DATA:  Upper abdominal pain EXAM: ABDOMEN ULTRASOUND COMPLETE COMPARISON:  None. FINDINGS: Gallbladder: Within the gallbladder, there are echogenic foci which move and shadow consistent with cholelithiasis. Largest gallstone measures 1.4 cm in length. There is no appreciable gallbladder wall thickening or pericholecystic fluid. No sonographic Murphy sign noted by sonographer. Common bile duct: Diameter: 3 mm. No intrahepatic, common hepatic, or common bile duct dilatation. Liver: No focal lesion identified. Within normal limits in parenchymal echogenicity. Portal vein is patent on color Doppler imaging with normal direction of blood flow towards the liver. IVC: No abnormality visualized. Pancreas: No pancreatic mass or inflammatory focus. Spleen: Size and appearance within normal limits. Right Kidney: Length: 8.8 cm. Echogenicity within normal limits. No hydronephrosis visualized. There is a questionable lesion arising from the lower pole of the right kidney measuring approximately 4 x 4 cm. This  area of questionable lesion arising from the lower pole the right kidney is somewhat ill-defined. A well-defined mass by ultrasound is not seen in this area. Left Kidney: Length: 6.6 cm. Echogenicity is increased with renal cortical thinning. No hydronephrosis visualized. There is a cyst arising from the upper pole of the right kidney measuring 1.7 x 1.2 x 0.9 cm. Abdominal aorta: No aneurysm visualized. Other findings: No demonstrable ascites. IMPRESSION: 1. Questionable mass arising from the lower pole right kidney. This finding warrants further evaluation with either pre and post-contrast CT or MR nonemergently to further evaluate. Right kidney overall is rather small. 2. Somewhat atrophic appearing left kidney with renal cortical thinning and increased renal echogenicity. Small cyst in left kidney but no hydronephrosis. The appearance of the left kidney may be indicative of chronic renal artery stenosis. In this regard, question whether patient is hypertensive. 3. Cholelithiasis. No gallbladder wall thickening or pericholecystic fluid. 4.  Study otherwise unremarkable. Electronically Signed   By: Lowella Grip III M.D.   On: 11/16/2017 15:25    Procedures Procedures (including critical care time)  Medications Ordered in ED Medications  fentaNYL (SUBLIMAZE) injection 50 mcg (not administered)     Initial Impression / Assessment and Plan / ED Course  I have reviewed the triage vital signs and the nursing notes.  Pertinent labs & imaging results that were available during my care of the patient were reviewed by me and considered in my medical decision  making (see chart for details).     Patient presents with right upper quadrant abdominal pain.  Has cholelithiasis.  However the ultrasound did not show other signs of cholecystitis.  Does have an apparent renal mass however.  Continued pain.  Not managed well at home.  Lab work overall reassuring.  I think patient would benefit from admission  to the hospital for pain control and further workup of biliary cause versus renal mass because of the pain.  No rash but zoster also considered since it is a potential dermatomal distribution.  Discussed with Dr. Dyann Kief from the hospitalist.  He requests further attempts at pain control in the ER.  Pain is controlled she can be worked up as an outpatient.  If not potentially could be admitted for pain control or further evaluation. Care turned over to Dr Winfred Leeds.  Final Clinical Impressions(s) / ED Diagnoses   Final diagnoses:  Right upper quadrant abdominal pain  Calculus of gallbladder without cholecystitis without obstruction  Renal mass    ED Discharge Orders    None       Davonna Belling, MD 11/16/17 1552    Davonna Belling, MD 11/16/17 Marlana Latus    Davonna Belling, MD 11/16/17 [phone]

## 2017-11-16 NOTE — ED Triage Notes (Addendum)
Pt reporting RUQ pain that started on Sunday. She states that the pain starting in her RUQ and radiates around to her back and R shoulder. Denies N/V/D. She states that the pain worsens with movement or changing positions. A&Ox4. Ambulatory. Pt is hypertensive in triage.

## 2017-11-16 NOTE — Telephone Encounter (Signed)
Copied from [place] [id number]. Topic: Quick Communication - See Telephone Encounter >> Nov 16, 2017 11:07 AM Bea Graff, NT wrote: CRM for notification. See Telephone encounter for: Pts daughter, [name], is calling to get the results of her moms chest xray from yesterday. She is also going to speak with nurse triage regarding her moms chest pain she is still experiencing.   11/16/17.

## 2017-11-16 NOTE — ED Provider Notes (Addendum)
Complains of right flank pain onset 2 days ago.  No nausea or vomiting.  Pain not made better or worse by anything pain is improved since treatment with IV fentanyl while here. History and urinalysis is consistent with pyelonephritis.  I discussed case with radiologist who suggested that if patient does not respond to medical therapy further imaging should be obtained with MRI of the abdomen without contrast   Orlie Dakin, MD 11/16/17 1810 I consulted Dr. Maudie Mercury who will arrange for overnight stay.  Urine sent for culture.  IV Rocephin ordered..  Renal insufficiency is chronic   Orlie Dakin, MD 11/16/17 [phone]

## 2017-11-16 NOTE — Telephone Encounter (Signed)
Patient has gone to ER today for further evaluation;

## 2017-11-16 NOTE — H&P (Signed)
TRH H&P   Patient Demographics:    [name], is a 72 y.o. female  MRN: [id number]   DOB - [birth year]  Admit Date - 11/16/2017  Outpatient Primary MD for the patient is [name], MD  Referring MD/NP/PA:  Angelina Ok  Outpatient Specialists:    Patient coming from: home  Chief Complaint  Patient presents with  . Abdominal Pain      HPI:    [name]  is a 72 y.o. female, w hypertension, hyperlipidemia, Dm2, with nephropathy, CKD stage4, CAD, presents with right flank pain starting 2 days ago.  Pt denies fever, chills, n/v, diarrhea, brbpr, black stool.  Pt presented to ED for evaluation of flank pain.    In Ed,   Lipase 30 Na 136, K 4.2, Glucose 256,  Bun 33, Creatinine 2.44 Alb 3.4 Ast 17, Alt 17  Wbc 10.2, Hgb 11.5, Plt 305 Urinalysis Wbc tntc, rbc tntc  Abdominal ultrasound IMPRESSION: 1. Questionable mass arising from the lower pole right kidney. This finding warrants further evaluation with either pre and post-contrast CT or MR nonemergently to further evaluate. Right kidney overall is rather small.  2. Somewhat atrophic appearing left kidney with renal cortical thinning and increased renal echogenicity. Small cyst in left kidney but no hydronephrosis. The appearance of the left kidney may be indicative of chronic renal artery stenosis. In this regard, question whether patient is hypertensive.  3. Cholelithiasis. No gallbladder wall thickening or pericholecystic fluid.  4.  Study otherwise unremarkable.   Pt will be admitted for uti, ? Pyelonephritis and possible kidney mass from the lower pole right kidney.     Review of systems:    In addition to the HPI above,  No Fever-chills, No Headache, No changes with Vision or hearing, No problems swallowing food or Liquids, No Chest pain, Cough or Shortness of Breath, No  Abdominal pain, No Nausea or Vommitting, Bowel movements are regular, No Blood in stool or Urine, No dysuria, No new skin rashes or bruises, No new joints pains-aches,  No new weakness, tingling, numbness in any extremity, No recent weight gain or loss, No polyuria, polydypsia or polyphagia, No significant Mental Stressors.  A full 10 point Review of Systems was done, except as stated above, all other Review of Systems were negative.   With Past History of the following :    Past Medical History:  Diagnosis Date  . Anemia, iron deficiency   . Arthritis   . CAD (coronary artery disease)   . Carotid stenosis    Carotid US (02/2014):  Bilateral ICA 40-59%; > 50% L ECA; F/u 1 year  . Cataract    removed both eyes  . Chronic kidney disease    chronic renal failure  . DM2 (diabetes mellitus, type 2) ([place])   . GERD (gastroesophageal reflux disease)   . Glaucoma   . HLD (hyperlipidemia)   .  HTN (hypertension)   . Hx of cardiovascular stress test    Lexiscan Myoview (03/24/14):  No ischemia, EF 73%, Normal  . Hypoglycemia 06/27/2017  . Mitral regurgitation   . Osteopenia 07/21/2017  . Osteoporosis   . PUD (peptic ulcer disease)   . PVD (peripheral vascular disease) ([place])   . Shortness of breath    with exertion      Past Surgical History:  Procedure Laterality Date  . ABDOMINAL HYSTERECTOMY    . APPENDECTOMY    . AV FISTULA PLACEMENT Right 01/18/2013   Procedure: ARTERIOVENOUS (AV) FISTULA CREATION;  Surgeon: Rosetta Posner, MD;  Location: [facility];  Service: Vascular;  Laterality: Right;  Ultrasound guided  . COLONOSCOPY    . CORONARY ANGIOPLASTY WITH STENT PLACEMENT    . RENAL ANGIOGRAM N/A 09/16/2011   Procedure: RENAL ANGIOGRAM;  Surgeon: Sherren Mocha, MD;  Location: [facility] CATH LAB;  Service: Cardiovascular;  Laterality: N/A;      Social History:     Social History   Tobacco Use  . Smoking status: Former Smoker    Last attempt to quit: 10/25/2007    Years since  quitting: 10.0  . Smokeless tobacco: Never Used  . Tobacco comment: Stopped 1.5 yrs ago January 2009  Substance Use Topics  . Alcohol use: No     Lives - at home   Mobility - typically can walk by self   Family History :     Family History  Problem Relation Age of Onset  . Heart disease Father   . Glaucoma Father   . Diabetes Sister   . Diabetes Son   . Colon cancer Neg Hx   . Colon polyps Neg Hx   . Esophageal cancer Neg Hx   . Rectal cancer Neg Hx   . Stomach cancer Neg Hx       Home Medications:   Prior to Admission medications   Medication Sig Start Date End Date Taking? Authorizing Provider  acetaminophen (TYLENOL) 500 MG tablet Take 1-2 tablets (500-1,000 mg total) by mouth every 8 (eight) hours as needed for mild pain. 06/29/17  Yes Hongalgi, Lenis Dickinson, MD  allopurinol (ZYLOPRIM) 100 MG tablet Take 100 mg by mouth daily.   Yes [provider]  amLODipine (NORVASC) 10 MG tablet TAKE ONE (1) TABLET EACH DAY 10/04/17  Yes [name], MD  aspirin 81 MG tablet Take 81 mg by mouth daily.   Yes [provider]  atorvastatin (LIPITOR) 80 MG tablet Take 1 tablet (80 mg total) by mouth daily at 6 PM. 03/10/17  Yes [name], MD  calcitRIOL (ROCALTROL) 0.25 MCG capsule Take 0.25 mcg by mouth daily.   Yes [provider]  clopidogrel (PLAVIX) 75 MG tablet Take 75 mg by mouth daily.   Yes [provider]  Cyanocobalamin (B-12 PO) Take 1 tablet by mouth daily.   Yes [provider]  ferrous sulfate 325 (65 FE) MG tablet TAKE 1 TABLET (325 MG TOTAL) BY MOUTH 2 (TWO) TIMES DAILY. 12/27/16  Yes [name], MD  furosemide (LASIX) 80 MG tablet Take 0.5 tablets (40 mg total) by mouth 2 (two) times daily. 06/29/17  Yes Hongalgi, Lenis Dickinson, MD  HUMALOG KWIKPEN 100 UNIT/ML KiwkPen Inject 0-0.09 mLs (0-9 Units total) into the skin 2 (two) times daily. Per sliding scale CBG LESS THAN 70= EAT OR DRINK SWEET + RECHECK CBG 70-120=0 CBG 121-150=1 CBG  151-200=2 CBG 201-250=3 CBG 251-300=5 CBG 301-350=7 CBG 351-400=9 CBG  GREATER THAN 400 CALL DOCTOR 10/13/17  Yes [name], MD  metoprolol tartrate (LOPRESSOR) 100 MG tablet TAKE 1 TABLET BY MOUTH TWICE A DAY 08/19/17  Yes [name], MD  omeprazole (PRILOSEC) 20 MG capsule TAKE 1 CAPSULE (20 MG TOTAL) BY MOUTH DAILY. 10/04/17  Yes [name], MD  potassium chloride SA (K-DUR,KLOR-CON) 20 MEQ tablet Take 20 mEq by mouth daily.   Yes [provider]  risedronate (ACTONEL) 150 MG tablet TAKE 1 TABLET BY MOUTH ONCE A MONTH-TAKE ON THE SAME DAY OF EACH MONTH. 11/01/17  Yes [name], MD  Tetrahydrozoline HCl (VISINE OP) Place 2 drops into both eyes daily as needed (DRY EYE).   Yes [provider]  timolol (BETIMOL) 0.5 % ophthalmic solution Place 1 drop into both eyes daily.    Yes [provider]  timolol (TIMOPTIC) 0.5 % ophthalmic solution PLACE 1 DROP IN BOTH EYES DAILY 11/08/17  Yes [provider]  tiZANidine (ZANAFLEX) 4 MG tablet TAKE 1 TABLET (4 MG TOTAL) BY MOUTH EVERY 6 (SIX) HOURS AS NEEDED FOR MUSCLE SPASMS. 10/09/17  Yes [name], MD  traMADol (ULTRAM) 50 MG tablet Take 1 tablet (50 mg total) by mouth every 6 (six) hours as needed. 07/06/17  Yes [name], MD  zolpidem (AMBIEN) 5 MG tablet Take 5 mg by mouth at bedtime as needed for sleep.   Yes [provider]  BD PEN NEEDLE NANO U/F 32G X 4 MM MISC USE DAILY WITH LANTUS SOLASTAR 05/20/14   [name], MD  blood glucose meter kit and supplies KIT Dispense based on patient and insurance preference. Use up to three times daily as directed. (FOR ICD-9 250.00, 250.01). 04/06/16   [name], MD  Blood Glucose Monitoring Suppl (ACCU-CHEK AVIVA PLUS) w/Device KIT USE AS DIRECTED ONCE DAILY 05/01/17   [name], MD  glucose blood test strip ACCU CHECK - Use as instructed once daily 03/09/17   [name], MD  Lancets MISC Use as directed three times daily with meals 03/09/17    [name], MD  nitroGLYCERIN (NITROSTAT) 0.4 MG SL tablet Place 0.4 mg under the tongue every 5 (five) minutes as needed for chest pain. Reported on 04/06/2016    [provider]  pioglitazone (ACTOS) 45 MG tablet TAKE 1 TABLET BY MOUTH EVERY DAY Patient not taking: Reported on 11/16/2017 08/19/17   [name], MD     Allergies:    No Known Allergies   Physical Exam:   Vitals  Blood pressure (!) 177/79, pulse 74, temperature 97.9 F (36.6 C), temperature source Oral, resp. rate 16, height 5' 1" (1.549 m), weight 48.5 kg (107 lb), SpO2 94 %.   1. General  lying in bed in NAD,    2. Normal affect and insight, Not Suicidal or Homicidal, Awake Alert, Oriented X 3.  3. No F.N deficits, ALL C.Nerves Intact, Strength 5/5 all 4 extremities, Sensation intact all 4 extremities, Plantars down going.  4. Ears and Eyes appear Normal, Conjunctivae clear, PERRLA. Moist Oral Mucosa.  5. Supple Neck, No JVD, No cervical lymphadenopathy appriciated, No Carotid Bruits.  6. Symmetrical Chest wall movement, Good air movement bilaterally, CTAB.  7. RRR, s1, s2, 2/6 sem apex  8. Positive Bowel Sounds, Abdomen Soft, No tenderness, No organomegaly appriciated,No rebound -guarding or rigidity.  9.  No Cyanosis, Normal Skin Turgor, No Skin Rash or Bruise.  10. Good muscle tone,  joints  appear normal , no effusions, Normal ROM.  11. No Palpable Lymph Nodes in Neck or Axillae  ? Slight cva tenderness    Data Review:    CBC Recent Labs  Lab 11/10/17 1323 11/16/17 1344  WBC  --  10.2  HGB 11.6* 11.5*  HCT  --  34.6*  PLT  --  305  MCV  --  83.2  MCH  --  27.6  MCHC  --  33.2  RDW  --  15.6*   ------------------------------------------------------------------------------------------------------------------  Chemistries  Recent Labs  Lab 11/16/17 1344  NA 136  K 4.2  CL 99*  CO2 25  GLUCOSE 256*  BUN 33*  CREATININE 2.44*  CALCIUM 9.0  AST 17  ALT 17  ALKPHOS  137*  BILITOT 0.7   ------------------------------------------------------------------------------------------------------------------ estimated creatinine clearance is 15.5 mL/min (A) (by C-G formula based on SCr of 2.44 mg/dL (H)). ------------------------------------------------------------------------------------------------------------------ No results for input(s): TSH, T4TOTAL, T3FREE, THYROIDAB in the last 72 hours.  Invalid input(s): FREET3  Coagulation profile No results for input(s): INR, PROTIME in the last 168 hours. ------------------------------------------------------------------------------------------------------------------- No results for input(s): DDIMER in the last 72 hours. -------------------------------------------------------------------------------------------------------------------  Cardiac Enzymes No results for input(s): CKMB, TROPONINI, MYOGLOBIN in the last 168 hours.  Invalid input(s): CK ------------------------------------------------------------------------------------------------------------------ No results found for: BNP   ---------------------------------------------------------------------------------------------------------------  Urinalysis    Component Value Date/Time   COLORURINE YELLOW 11/16/2017 1431   APPEARANCEUR HAZY (A) 11/16/2017 1431   LABSPEC 1.005 11/16/2017 1431   PHURINE 7.0 11/16/2017 1431   GLUCOSEU NEGATIVE 11/16/2017 1431   GLUCOSEU NEGATIVE 06/08/2017 1553   HGBUR SMALL (A) 11/16/2017 1431   BILIRUBINUR NEGATIVE 11/16/2017 1431   KETONESUR NEGATIVE 11/16/2017 1431   PROTEINUR 30 (A) 11/16/2017 1431   UROBILINOGEN 0.2 06/08/2017 1553   NITRITE NEGATIVE 11/16/2017 1431   LEUKOCYTESUR LARGE (A) 11/16/2017 1431    ----------------------------------------------------------------------------------------------------------------   Imaging Results:    Dg Chest 2 View  Result Date: 11/15/2017 CLINICAL DATA:  Pain  under LEFT breast for 2 days, history hypertension, diabetes mellitus, coronary disease post stenting, former smoker EXAM: CHEST  2 VIEW COMPARISON:  08/08/2017 FINDINGS: Enlargement of cardiac silhouette. Coronary arterial stent noted. Atherosclerotic calcification aorta. Mediastinal contours and pulmonary vascularity normal. Mild bibasilar atelectasis greater on LEFT. Lungs otherwise clear. No infiltrate, pleural effusion or pneumothorax. Bones unremarkable. IMPRESSION: Bibasilar atelectasis greater on LEFT. Enlargement of cardiac silhouette. Aortic atherosclerosis. Electronically Signed   By: Lavonia Dana M.D.   On: 11/15/2017 17:46   US Abdomen Complete  Result Date: 11/16/2017 CLINICAL DATA:  Upper abdominal pain EXAM: ABDOMEN ULTRASOUND COMPLETE COMPARISON:  None. FINDINGS: Gallbladder: Within the gallbladder, there are echogenic foci which move and shadow consistent with cholelithiasis. Largest gallstone measures 1.4 cm in length. There is no appreciable gallbladder wall thickening or pericholecystic fluid. No sonographic Murphy sign noted by sonographer. Common bile duct: Diameter: 3 mm. No intrahepatic, common hepatic, or common bile duct dilatation. Liver: No focal lesion identified. Within normal limits in parenchymal echogenicity. Portal vein is patent on color Doppler imaging with normal direction of blood flow towards the liver. IVC: No abnormality visualized. Pancreas: No pancreatic mass or inflammatory focus. Spleen: Size and appearance within normal limits. Right Kidney: Length: 8.8 cm. Echogenicity within normal limits. No hydronephrosis visualized. There is a questionable lesion arising from the lower pole of the right kidney measuring approximately 4 x 4 cm. This area of questionable lesion arising from the lower pole the right kidney is somewhat ill-defined. A well-defined mass by  ultrasound is not seen in this area. Left Kidney: Length: 6.6 cm. Echogenicity is increased with renal cortical  thinning. No hydronephrosis visualized. There is a cyst arising from the upper pole of the right kidney measuring 1.7 x 1.2 x 0.9 cm. Abdominal aorta: No aneurysm visualized. Other findings: No demonstrable ascites. IMPRESSION: 1. Questionable mass arising from the lower pole right kidney. This finding warrants further evaluation with either pre and post-contrast CT or MR nonemergently to further evaluate. Right kidney overall is rather small. 2. Somewhat atrophic appearing left kidney with renal cortical thinning and increased renal echogenicity. Small cyst in left kidney but no hydronephrosis. The appearance of the left kidney may be indicative of chronic renal artery stenosis. In this regard, question whether patient is hypertensive. 3. Cholelithiasis. No gallbladder wall thickening or pericholecystic fluid. 4.  Study otherwise unremarkable. Electronically Signed   By: Lowella Grip III M.D.   On: 11/16/2017 15:25       Assessment & Plan:    Principal Problem:   Pyelonephritis Active Problems:   Anemia   Diabetes mellitus with complication ([place])    Uti ? Pyelonephritis Urine culture Blood culture x2 Rocephin 1gm iv qday  R renal mass  ? Check MRI abdomen  Anemia Check cbc in am  CKD stage4 Hydrate with ns iv  Check cmp in am  Dm2 fsbs ac and qhs ISS  Hypertension Hydralazine 1m iv q6h prn sbp >160  CAD Cont metoprolol Cont aspirin Cont plavix Cont lipitor   DVT Prophylaxis Lovenox - SCDs   AM Labs Ordered, also please review Full Orders  Family Communication: Admission, patients condition and plan of care including tests being ordered have been discussed with the patient  who indicate understanding and agree with the plan and Code Status.  Code Status FULL CODE  Likely DC to   home  Condition GUARDED   Consults called:  none  Admission status: observation, may need to change to inpatient  Time spent in minutes : 45   JJani GravelM.D on 11/16/2017 at  7:26 PM  Between 7am to 7pm - Pager - [phone]  After 7pm go to [email or web address] - password [email or web address] - Office  [phone]

## 2017-11-16 NOTE — Telephone Encounter (Signed)
Pt's daughter [name] called stating that her mother is complaining of chest pain under her right breast going around to her right side; pt was evaluated 11/15/17 by Jodi Mourning; recommendations made per nurse nurse triage; spoke with [place] at [facility] regarding this; pt has not yet had abdominal ultrasound done per Jodi Mourning on 11/15/17; pt's daughter verbalizes understanding and will follow recommendation to take her mother to the ED; will route to [facility] for notification of this encounter.  Reason for Disposition . [1] SEVERE pain (e.g., excruciating) AND [2] present > 1 hour  Answer Assessment - Initial Assessment Questions 1. LOCATION: "Where does it hurt?"      Under right breast going around to right side 2.  Under right breast going around to right sideRADIATION: "Does the pain shoot anywhere else?" (e.g., chest, back)      3. ONSET: "When did the pain begin?" (e.g., minutes, hours or days ago)      11/15/17 4. SUDDEN: "Gradual or sudden onset?"     sudden 5. PATTERN "Does the pain come and go, or is it constant?"    - If constant: "Is it getting better, staying the same, or worsening?"      (Note: Constant means the pain never goes away completely; most serious pain is constant and it progresses)     - If intermittent: "How long does it last?" "Do you have pain now?"     (Note: Intermittent means the pain goes away completely between bouts)     constant 6. SEVERITY: "How bad is the pain?"  (e.g., Scale 1-10; mild, moderate, or severe)   - MILD (1-3): doesn't interfere with normal activities, abdomen soft and not tender to touch    - MODERATE (4-7): interferes with normal activities or awakens from sleep, tender to touch    - SEVERE (8-10): excruciating pain, doubled over, unable to do any normal activities      severe 7. RECURRENT SYMPTOM: "Have you ever had this type of abdominal pain before?" If so, ask: "When was the last time?" and "What happened that time?"      no 8. CAUSE:  "What do you think is causing the abdominal pain?"     unsure 9. RELIEVING/AGGRAVATING FACTORS: "What makes it better or worse?" (e.g., movement, antacids, bowel movement)     Ginger ale and gas-x made it a little better 10. OTHER SYMPTOMS: "Has there been any vomiting, diarrhea, constipation, or urine problems?"       Dry heaves but not vomiting  11. PREGNANCY: "Is there any chance you are pregnant?" "When was your last menstrual period?"       no  Protocols used: ABDOMINAL PAIN - UPPER-A-AH, ABDOMINAL PAIN - [facility]

## 2017-11-17 ENCOUNTER — Encounter (HOSPITAL_COMMUNITY): Payer: Self-pay | Admitting: Family Medicine

## 2017-11-17 DIAGNOSIS — K828 Other specified diseases of gallbladder: Secondary | ICD-10-CM | POA: Diagnosis present

## 2017-11-17 DIAGNOSIS — N12 Tubulo-interstitial nephritis, not specified as acute or chronic: Secondary | ICD-10-CM | POA: Diagnosis not present

## 2017-11-17 DIAGNOSIS — K8020 Calculus of gallbladder without cholecystitis without obstruction: Secondary | ICD-10-CM

## 2017-11-17 DIAGNOSIS — Z7902 Long term (current) use of antithrombotics/antiplatelets: Secondary | ICD-10-CM | POA: Diagnosis not present

## 2017-11-17 DIAGNOSIS — E118 Type 2 diabetes mellitus with unspecified complications: Secondary | ICD-10-CM | POA: Diagnosis not present

## 2017-11-17 DIAGNOSIS — I2510 Atherosclerotic heart disease of native coronary artery without angina pectoris: Secondary | ICD-10-CM | POA: Diagnosis present

## 2017-11-17 DIAGNOSIS — M5412 Radiculopathy, cervical region: Secondary | ICD-10-CM | POA: Diagnosis present

## 2017-11-17 DIAGNOSIS — E669 Obesity, unspecified: Secondary | ICD-10-CM | POA: Diagnosis present

## 2017-11-17 DIAGNOSIS — I700 Atherosclerosis of aorta: Secondary | ICD-10-CM

## 2017-11-17 DIAGNOSIS — E1151 Type 2 diabetes mellitus with diabetic peripheral angiopathy without gangrene: Secondary | ICD-10-CM | POA: Diagnosis present

## 2017-11-17 DIAGNOSIS — H409 Unspecified glaucoma: Secondary | ICD-10-CM | POA: Diagnosis present

## 2017-11-17 DIAGNOSIS — M8580 Other specified disorders of bone density and structure, unspecified site: Secondary | ICD-10-CM | POA: Diagnosis present

## 2017-11-17 DIAGNOSIS — I129 Hypertensive chronic kidney disease with stage 1 through stage 4 chronic kidney disease, or unspecified chronic kidney disease: Secondary | ICD-10-CM | POA: Diagnosis present

## 2017-11-17 DIAGNOSIS — I340 Nonrheumatic mitral (valve) insufficiency: Secondary | ICD-10-CM | POA: Diagnosis present

## 2017-11-17 DIAGNOSIS — M810 Age-related osteoporosis without current pathological fracture: Secondary | ICD-10-CM | POA: Diagnosis present

## 2017-11-17 DIAGNOSIS — D649 Anemia, unspecified: Secondary | ICD-10-CM | POA: Diagnosis present

## 2017-11-17 DIAGNOSIS — Z6820 Body mass index (BMI) 20.0-20.9, adult: Secondary | ICD-10-CM | POA: Diagnosis not present

## 2017-11-17 DIAGNOSIS — N281 Cyst of kidney, acquired: Secondary | ICD-10-CM | POA: Diagnosis present

## 2017-11-17 DIAGNOSIS — Z9842 Cataract extraction status, left eye: Secondary | ICD-10-CM | POA: Diagnosis not present

## 2017-11-17 DIAGNOSIS — K219 Gastro-esophageal reflux disease without esophagitis: Secondary | ICD-10-CM | POA: Diagnosis present

## 2017-11-17 DIAGNOSIS — N184 Chronic kidney disease, stage 4 (severe): Secondary | ICD-10-CM

## 2017-11-17 DIAGNOSIS — R1011 Right upper quadrant pain: Secondary | ICD-10-CM | POA: Diagnosis present

## 2017-11-17 DIAGNOSIS — N2889 Other specified disorders of kidney and ureter: Secondary | ICD-10-CM | POA: Diagnosis present

## 2017-11-17 DIAGNOSIS — N10 Acute tubulo-interstitial nephritis: Secondary | ICD-10-CM | POA: Diagnosis present

## 2017-11-17 DIAGNOSIS — Z9841 Cataract extraction status, right eye: Secondary | ICD-10-CM | POA: Diagnosis not present

## 2017-11-17 DIAGNOSIS — E785 Hyperlipidemia, unspecified: Secondary | ICD-10-CM | POA: Diagnosis present

## 2017-11-17 DIAGNOSIS — E1122 Type 2 diabetes mellitus with diabetic chronic kidney disease: Secondary | ICD-10-CM | POA: Diagnosis present

## 2017-11-17 HISTORY — DX: Atherosclerosis of aorta: I70.0

## 2017-11-17 HISTORY — DX: Calculus of gallbladder without cholecystitis without obstruction: K80.20

## 2017-11-17 LAB — COMPREHENSIVE METABOLIC PANEL
ALBUMIN: 3.1 g/dL — AB (ref 3.5–5.0)
ALK PHOS: 126 U/L (ref 38–126)
ALT: 15 U/L (ref 14–54)
ANION GAP: 11 (ref 5–15)
AST: 13 U/L — AB (ref 15–41)
BUN: 38 mg/dL — AB (ref 6–20)
CO2: 28 mmol/L (ref 22–32)
Calcium: 8.9 mg/dL (ref 8.9–10.3)
Chloride: 98 mmol/L — ABNORMAL LOW (ref 101–111)
Creatinine, Ser: 2.4 mg/dL — ABNORMAL HIGH (ref 0.44–1.00)
GFR calc Af Amer: 22 mL/min — ABNORMAL LOW (ref >60)
GFR calc non Af Amer: 19 mL/min — ABNORMAL LOW (ref >60)
GLUCOSE: 149 mg/dL — AB (ref 65–99)
POTASSIUM: 3.6 mmol/L (ref 3.5–5.1)
SODIUM: 137 mmol/L (ref 135–145)
Total Bilirubin: 0.6 mg/dL (ref 0.3–1.2)
Total Protein: 6.6 g/dL (ref 6.5–8.1)

## 2017-11-17 LAB — CBC
HEMATOCRIT: 32.6 % — AB (ref 36.0–46.0)
HEMOGLOBIN: 10.7 g/dL — AB (ref 12.0–15.0)
MCH: 27.1 pg (ref 26.0–34.0)
MCHC: 32.8 g/dL (ref 30.0–36.0)
MCV: 82.5 fL (ref 78.0–100.0)
Platelets: 300 10*3/uL (ref 150–400)
RBC: 3.95 MIL/uL (ref 3.87–5.11)
RDW: 15.4 % (ref 11.5–15.5)
WBC: 8.8 10*3/uL (ref 4.0–10.5)

## 2017-11-17 LAB — GLUCOSE, CAPILLARY
GLUCOSE-CAPILLARY: 149 mg/dL — AB (ref 65–99)
GLUCOSE-CAPILLARY: 227 mg/dL — AB (ref 65–99)
Glucose-Capillary: 185 mg/dL — ABNORMAL HIGH (ref 65–99)
Glucose-Capillary: 163 mg/dL — ABNORMAL HIGH (ref 65–99)
Glucose-Capillary: 243 mg/dL — ABNORMAL HIGH (ref 65–99)

## 2017-11-17 NOTE — Care Management Note (Signed)
Case Management Note  Patient Details  Name: [name] MRN: [id number] Date of Birth: 11-Sep-1946  Subjective/Objective:  72 y/o f admitted w/Pyelonephritis. From home w/daughter,has rw,cane. Has pcp,pharmacy.                  Action/Plan:d/c plan home.   Expected Discharge Date:  (unknown)               Expected Discharge Plan:  Home/Self Care  In-House Referral:     Discharge planning Services  CM Consult  Post Acute Care Choice:    Choice offered to:     DME Arranged:    DME Agency:     HH Arranged:    HH Agency:     Status of Service:  In process, will continue to follow  If discussed at Long Length of Stay Meetings, dates discussed:    Additional Comments:  Dessa Phi, RN 11/17/2017, 12:49 PM

## 2017-11-17 NOTE — Care Management Note (Signed)
Case Management Note  Patient Details  Name: [name] MRN: [id number] Date of Birth: 13-Mar-1946  Subjective/Objective: 72 y/o f admitted w/Pyelonephritis. From Home.                   Action/Plan:d/c plan home.   Expected Discharge Date:  (unknown)               Expected Discharge Plan:  Home/Self Care  In-House Referral:     Discharge planning Services  CM Consult  Post Acute Care Choice:    Choice offered to:     DME Arranged:    DME Agency:     HH Arranged:    HH Agency:     Status of Service:  In process, will continue to follow  If discussed at Long Length of Stay Meetings, dates discussed:    Additional Comments:  Dessa Phi, RN 11/17/2017, 11:52 AM

## 2017-11-17 NOTE — Progress Notes (Signed)
PROGRESS NOTE  [name] XTG:[id number] DOB: [birth year] DOA: 11/16/2017 PCP: Ashley Borg, MD  Brief Narrative: 68yow PMH HTN, DM type 2, CKD stage IV presented with right flank pain. Admitted for pyelonephritis, further eval of renal mass  Assessment/Plan Pyelonephritis. MR abdomen showed severe scarring in the lower pole of the right kidney. No discrete right renal mass on this non-contrast MRI. No hydronephrosis. - improving but still has significant right flank and RUQ pain - continue empiric ceftriaxone and f/u UC  DM type 2 with nephropathy - remains stable  Asymmetric severe global left renal atrophy, may be indicative of chronic renal artery stenosis  - f/u as outpatient  Cholelithiasis with distended gallbladder. No gallbladder wall thickening or pericholecystic fluid on u/s. LFTs and lipase WNL. - eating lunch without pain at this point. Pain elicited with palpation of flank and RUQ, presume secondary to pyelo. Do not suspect cholecystitis.  CKD stage IV - at baseline  CAD - continue metoprolol, ASA, clopidogrel, atorvastatin  Aortic atherosclerosis  - continue statin   Overall improving but not ready for d/c with ongoing pain, will continue IV abx, possibly home 1/26.  DVT prophylaxis: enoxaparin Code Status: full Family Communication: daughter [name] by telephone per patient request Disposition Plan: home    Ashley Hodgkins, MD  [facility] Direct contact: [phone] --Via amion app OR  --[email or web address]; password [email or web address]  7PM-7AM contact night coverage as above 11/17/2017, 12:44 PM  LOS: 0 days   Consultants:    Procedures:    Antimicrobials:  Ceftriaxone 1/24 >>  Interval history/Subjective: AF, VSS  Feels better today but still has pain in right flank radiating around to RUQ.  Objective: Vitals:  Vitals:   11/16/17 2339 11/17/17 0434  BP: (!) 160/57 134/68  Pulse:  68  Resp:  18  Temp:  98.5 F (36.9 C)  SpO2:  92%     Exam:  Constitutional:  . Appears calm and comfortable, eating sandwich for lunch. Respiratory:  . CTA bilaterally, no w/r/r.  . Respiratory effort normal.  Cardiovascular:  . RRR, no m/r/g . No LE extremity edema   Abdomen:  . Soft, obese, mild to moderate right flank and RUQ pain with palpation. No rash on back or right side of abdomen. Skin:  . No rashes, lesions, ulcers Psychiatric:  . Mental status o Mood, affect appropriate  I have personally reviewed the following:   Labs:  Creatinine stable 2.4, at baseline, remainder CMP unremarkable  Hgb stable 10.7. WBC WNL.  U/A grossly positive  Imaging studies:  CXR NAD  Abd u/s noted  MR abd noted  Scheduled Meds: . allopurinol  100 mg Oral Daily  . amLODipine  10 mg Oral Daily  . aspirin EC  81 mg Oral Daily  . atorvastatin  80 mg Oral q1800  . calcitRIOL  0.25 mcg Oral Daily  . clopidogrel  75 mg Oral Daily  . enoxaparin (LOVENOX) injection  30 mg Subcutaneous Q24H  . ferrous sulfate  325 mg Oral BID WC  . furosemide  40 mg Oral BID  . insulin aspart  0-5 Units Subcutaneous QHS  . insulin aspart  0-9 Units Subcutaneous TID WC  . metoprolol tartrate  100 mg Oral BID  . pantoprazole  40 mg Oral Daily  . potassium chloride SA  20 mEq Oral Daily  . timolol  1 drop Both Eyes Daily   Continuous Infusions: . cefTRIAXone (ROCEPHIN)  IV      Principal Problem:   Pyelonephritis  Active Problems:   Diabetes mellitus with complication ([facility])   Cholelithiasis   Chronic kidney disease (CKD), stage IV (severe) ([place])   Aortic atherosclerosis ([place])   LOS: 0 days

## 2017-11-18 DIAGNOSIS — N184 Chronic kidney disease, stage 4 (severe): Secondary | ICD-10-CM

## 2017-11-18 DIAGNOSIS — E118 Type 2 diabetes mellitus with unspecified complications: Secondary | ICD-10-CM

## 2017-11-18 DIAGNOSIS — K8020 Calculus of gallbladder without cholecystitis without obstruction: Secondary | ICD-10-CM

## 2017-11-18 DIAGNOSIS — N12 Tubulo-interstitial nephritis, not specified as acute or chronic: Secondary | ICD-10-CM

## 2017-11-18 LAB — URINE CULTURE

## 2017-11-18 LAB — GLUCOSE, CAPILLARY
GLUCOSE-CAPILLARY: 237 mg/dL — AB (ref 65–99)
Glucose-Capillary: 185 mg/dL — ABNORMAL HIGH (ref 65–99)

## 2017-11-18 MED ORDER — CEFUROXIME AXETIL 500 MG PO TABS: 500.0000 mg | ORAL_TABLET | Freq: Every day | ORAL | 0 refills | Status: AC

## 2017-11-18 NOTE — Discharge Summary (Signed)
Physician Discharge Summary  [name] MCN:[id number] DOB: Feb 27, 1946 DOA: 11/16/2017  PCP: [name], MD  Admit date: 11/16/2017 Discharge date: 11/18/2017  Recommendations for Outpatient Follow-up:  1. Resolution of acute pyelonephritis 2. Asymmetric severe global left renal atrophy, may be indicative of chronic renal artery stenosis. Could consider further evaluation as an outpatient.   Follow-up Information    [facility] Follow up.   Contact information: [address]       [name], MD. Schedule an appointment as soon as possible for a visit in 1 week(s).   Specialties:  Internal Medicine, Radiology Contact information: [address] [address] [phone]            Discharge Diagnoses:  1. Acute pyelonephritis 2. DM type 2 with nephropathy 3. Asymmetric severe global left renal atrophy 4. Cholelithiasis with distended gallbladder 5. CKD stage IV 6. CAD 7. Aortic atherosclerosis   Discharge Condition: improved Disposition: home  Diet recommendation: low sodium, heart healthy  Filed Weights   11/16/17 2121 11/17/17 0434 11/18/17 0422  Weight: 84.6 kg (186 lb 8.2 oz) 87.5 kg (192 lb 14.4 oz) 84.7 kg (186 lb 11.7 oz)    History of present illness:  31yow PMH HTN, DM type 2, CKD stage IV presented with right flank pain. Admitted for pyelonephritis, further eval of renal mass  Hospital Course:  Patient was treated with IV abx with rapid clinical improvement and resolution of pain. Urine culture was unrevealing, but based on improvement with ceftriaxone pt will complete a course of Ceftin as an outpatient. Hospitalization was uncomplicated; individual issues as below.  Pyelonephritis. MR abdomen showed severe scarring in the lower pole of the right kidney. No discrete right renal mass on this non-contrast MRI. No hydronephrosis.  DM type 2 with  nephropathy - remained stable  Asymmetric severe global left renal atrophy, may be indicative of chronic renal artery stenosis  - f/u as outpatient  Cholelithiasis with distended gallbladder. No gallbladder wall thickening or pericholecystic fluid on u/s. LFTs and lipase WNL. - eating without pain. Pain elicited with palpation of flank and RUQ, secondary to pyelo. No evidence to suggest cholecystitis.  CKD stage IV - remained at baseline  CAD - remained stable    Aortic atherosclerosis  - continue statin    Antimicrobials:  Ceftriaxone 1/24 >> 1/25  Ceftin 1/26 >> 2/2  Today's assessment: S: feels much better today; pain right side resolved; tolerating diet without pain. No urinary symptoms. O: Vitals:  Vitals:   11/18/17 0422 11/18/17 0750  BP: (!) 184/65 (!) 162/77  Pulse: 65 72  Resp: 20   Temp: 98 F (36.7 C)   SpO2: 98%     Constitutional:  . Appears calm and comfortable Respiratory:  . CTA bilaterally, no w/r/r.  . Respiratory effort normal.  Cardiovascular:  . RRR, no m/r/g Psychiatric:  . Mental status o Mood, affect appropriate  CBG stable UC multiple species  Discharge Instructions  Discharge Instructions    Diet - low sodium heart healthy   Complete by:  As directed    Discharge instructions   Complete by:  As directed    Call your physician or seek immediate medical attention for fever, pain, confusion or worsening of condition   Increase activity slowly   Complete by:  As directed      Allergies as of 11/18/2017   No Known Allergies  Medication List    TAKE these medications   ACCU-CHEK AVIVA PLUS w/Device Kit USE AS DIRECTED ONCE DAILY   acetaminophen 500 MG tablet Commonly known as:  TYLENOL Take 1-2 tablets (500-1,000 mg total) by mouth every 8 (eight) hours as needed for mild pain.   allopurinol 100 MG tablet Commonly known as:  ZYLOPRIM Take 100 mg by mouth daily.   amLODipine 10 MG tablet Commonly known as:   NORVASC TAKE ONE (1) TABLET EACH DAY   aspirin 81 MG tablet Take 81 mg by mouth daily.   atorvastatin 80 MG tablet Commonly known as:  LIPITOR Take 1 tablet (80 mg total) by mouth daily at 6 PM.   B-12 PO Take 1 tablet by mouth daily.   BD PEN NEEDLE NANO U/F 32G X 4 MM Misc Generic drug:  Insulin Pen Needle USE DAILY WITH LANTUS SOLASTAR   blood glucose meter kit and supplies Kit Dispense based on patient and insurance preference. Use up to three times daily as directed. (FOR ICD-9 250.00, 250.01).   calcitRIOL 0.25 MCG capsule Commonly known as:  ROCALTROL Take 0.25 mcg by mouth daily.   cefUROXime 500 MG tablet Commonly known as:  CEFTIN Take 1 tablet (500 mg total) by mouth at bedtime for 8 doses.   clopidogrel 75 MG tablet Commonly known as:  PLAVIX Take 75 mg by mouth daily.   ferrous sulfate 325 (65 FE) MG tablet TAKE 1 TABLET (325 MG TOTAL) BY MOUTH 2 (TWO) TIMES DAILY.   furosemide 80 MG tablet Commonly known as:  LASIX Take 0.5 tablets (40 mg total) by mouth 2 (two) times daily.   glucose blood test strip ACCU CHECK - Use as instructed once daily   HUMALOG KWIKPEN 100 UNIT/ML KiwkPen Generic drug:  insulin lispro Inject 0-0.09 mLs (0-9 Units total) into the skin 2 (two) times daily. Per sliding scale CBG LESS THAN 70= EAT OR DRINK SWEET + RECHECK CBG 70-120=0 CBG 121-150=1 CBG 151-200=2 CBG 201-250=3 CBG 251-300=5 CBG 301-350=7 CBG 351-400=9 CBG GREATER THAN 400 CALL DOCTOR   Lancets Misc Use as directed three times daily with meals   metoprolol tartrate 100 MG tablet Commonly known as:  LOPRESSOR TAKE 1 TABLET BY MOUTH TWICE A DAY   nitroGLYCERIN 0.4 MG SL tablet Commonly known as:  NITROSTAT Place 0.4 mg under the tongue every 5 (five) minutes as needed for chest pain. Reported on 04/06/2016   omeprazole 20 MG capsule Commonly known as:  PRILOSEC TAKE 1 CAPSULE (20 MG TOTAL) BY MOUTH DAILY.   pioglitazone 45 MG tablet Commonly known as:   ACTOS TAKE 1 TABLET BY MOUTH EVERY DAY   potassium chloride SA 20 MEQ tablet Commonly known as:  K-DUR,KLOR-CON Take 20 mEq by mouth daily.   risedronate 150 MG tablet Commonly known as:  ACTONEL TAKE 1 TABLET BY MOUTH ONCE A MONTH-TAKE ON THE SAME DAY OF EACH MONTH.   timolol 0.5 % ophthalmic solution Commonly known as:  BETIMOL Place 1 drop into both eyes daily.   timolol 0.5 % ophthalmic solution Commonly known as:  TIMOPTIC PLACE 1 DROP IN BOTH EYES DAILY   tiZANidine 4 MG tablet Commonly known as:  ZANAFLEX TAKE 1 TABLET (4 MG TOTAL) BY MOUTH EVERY 6 (SIX) HOURS AS NEEDED FOR MUSCLE SPASMS.   traMADol 50 MG tablet Commonly known as:  ULTRAM Take 1 tablet (50 mg total) by mouth every 6 (six) hours as needed.   VISINE OP Place 2 drops into both eyes  daily as needed (DRY EYE).   zolpidem 5 MG tablet Commonly known as:  AMBIEN Take 5 mg by mouth at bedtime as needed for sleep.      No Known Allergies  The results of significant diagnostics from this hospitalization (including imaging, microbiology, ancillary and laboratory) are listed below for reference.    Significant Diagnostic Studies: Dg Chest 2 View  Result Date: 11/15/2017 CLINICAL DATA:  Pain under LEFT breast for 2 days, history hypertension, diabetes mellitus, coronary disease post stenting, former smoker EXAM: CHEST  2 VIEW COMPARISON:  08/08/2017 FINDINGS: Enlargement of cardiac silhouette. Coronary arterial stent noted. Atherosclerotic calcification aorta. Mediastinal contours and pulmonary vascularity normal. Mild bibasilar atelectasis greater on LEFT. Lungs otherwise clear. No infiltrate, pleural effusion or pneumothorax. Bones unremarkable. IMPRESSION: Bibasilar atelectasis greater on LEFT. Enlargement of cardiac silhouette. Aortic atherosclerosis. Electronically Signed   By: Lavonia Dana M.D.   On: 11/15/2017 17:46   Mr Abdomen Wo Contrast  Result Date: 11/16/2017 CLINICAL DATA:  Right flank pain.  End-stage renal disease. Questionable lower right renal mass on abdominal sonogram. EXAM: MRI ABDOMEN WITHOUT CONTRAST TECHNIQUE: Multiplanar multisequence MR imaging was performed without the administration of intravenous contrast. COMPARISON:  11/16/2017 abdominal sonogram. FINDINGS: Examination is significantly limited by noncontrast technique and motion artifact. Lower chest: Subsegmental scarring versus atelectasis at the dependent lung bases. Hepatobiliary: Normal liver size and configuration. No hepatic steatosis. Two subcentimeter T2 hyperintense T1 hypointense right liver lobe lesions are incompletely characterized on this noncontrast scan, most likely to represent simple liver cysts. No additional liver lesions. Gallbladder is mildly distended. Subcentimeter gallstones are layering in the gallbladder. No gallbladder wall thickening or pericholecystic fluid. No biliary ductal dilatation. Common bile duct diameter 5 mm. No evidence of choledocholithiasis. Pancreas: No pancreatic mass or duct dilation.  No pancreas divisum. Spleen: Normal size spleen. No splenic mass. Loss of signal intensity on inphase chemical shift imaging in the spleen is compatible with splenic iron deposition, probably transfusion related. Adrenals/Urinary Tract: Normal adrenals. No hydronephrosis. Severe asymmetric global left renal atrophy. There is severe renal parenchymal scarring in the lower pole of the right kidney. No discrete right renal mass. Small simple appearing renal cysts in the left kidney, largest 1.0 cm. Stomach/Bowel: Grossly normal stomach. Visualized small and large bowel is normal caliber, with no bowel wall thickening. Vascular/Lymphatic: Normal caliber abdominal aorta. No pathologically enlarged lymph nodes in the abdomen. Other: No abdominal ascites or focal fluid collection. Musculoskeletal: No aggressive appearing focal osseous lesions. IMPRESSION: 1. Severe scarring in the lower pole of the right kidney. No  discrete right renal mass on this non-contrast MRI. No hydronephrosis. 2. Asymmetric severe global left renal atrophy. Small simple appearing left renal cysts. 3. Cholelithiasis. Distended gallbladder. No specific findings of acute cholecystitis. No biliary ductal dilatation. 4. 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 Electronically Signed   By: Ilona Sorrel M.D.   On: 11/16/2017 21:24   US Abdomen Complete  Result Date: 11/16/2017 CLINICAL DATA:  Upper abdominal pain EXAM: ABDOMEN ULTRASOUND COMPLETE COMPARISON:  None. FINDINGS: Gallbladder: Within the gallbladder, there are echogenic foci which move and shadow consistent with cholelithiasis. Largest gallstone measures 1.4 cm in length. There is no appreciable gallbladder wall thickening or pericholecystic fluid. No sonographic Murphy sign noted by sonographer. Common bile duct: Diameter: 3 mm. No intrahepatic, common hepatic, or common bile duct dilatation. Liver: No focal lesion identified. Within normal limits in parenchymal echogenicity. Portal vein is patent on color Doppler imaging with normal direction  of blood flow towards the liver. IVC: No abnormality visualized. Pancreas: No pancreatic mass or inflammatory focus. Spleen: Size and appearance within normal limits. Right Kidney: Length: 8.8 cm. Echogenicity within normal limits. No hydronephrosis visualized. There is a questionable lesion arising from the lower pole of the right kidney measuring approximately 4 x 4 cm. This area of questionable lesion arising from the lower pole the right kidney is somewhat ill-defined. A well-defined mass by ultrasound is not seen in this area. Left Kidney: Length: 6.6 cm. Echogenicity is increased with renal cortical thinning. No hydronephrosis visualized. There is a cyst arising from the upper pole of the right kidney measuring 1.7 x 1.2 x 0.9 cm. Abdominal aorta: No aneurysm visualized. Other findings: No demonstrable ascites. IMPRESSION:  1. Questionable mass arising from the lower pole right kidney. This finding warrants further evaluation with either pre and post-contrast CT or MR nonemergently to further evaluate. Right kidney overall is rather small. 2. Somewhat atrophic appearing left kidney with renal cortical thinning and increased renal echogenicity. Small cyst in left kidney but no hydronephrosis. The appearance of the left kidney may be indicative of chronic renal artery stenosis. In this regard, question whether patient is hypertensive. 3. Cholelithiasis. No gallbladder wall thickening or pericholecystic fluid. 4.  Study otherwise unremarkable. Electronically Signed   By: Lowella Grip III M.D.   On: 11/16/2017 15:25    Microbiology: Recent Results (from the past 240 hour(s))  Urine Culture     Status: Abnormal   Collection Time: 11/16/17  7:36 PM  Result Value Ref Range Status   Specimen Description URINE, RANDOM  Final   Special Requests Immunocompromised  Final   Culture MULTIPLE SPECIES PRESENT, SUGGEST RECOLLECTION (A)  Final   Report Status 11/18/2017 FINAL  Final     Labs: Basic Metabolic Panel: Recent Labs  Lab 11/16/17 1344 11/17/17 0519  NA 136 137  K 4.2 3.6  CL 99* 98*  CO2 25 28  GLUCOSE 256* 149*  BUN 33* 38*  CREATININE 2.44* 2.40*  CALCIUM 9.0 8.9   Liver Function Tests: Recent Labs  Lab 11/16/17 1344 11/17/17 0519  AST 17 13*  ALT 17 15  ALKPHOS 137* 126  BILITOT 0.7 0.6  PROT 7.1 6.6  ALBUMIN 3.4* 3.1*   Recent Labs  Lab 11/16/17 1344  LIPASE 30   CBC: Recent Labs  Lab 11/16/17 1344 11/17/17 0519  WBC 10.2 8.8  HGB 11.5* 10.7*  HCT 34.6* 32.6*  MCV 83.2 82.5  PLT 305 300    CBG: Recent Labs  Lab 11/17/17 1200 11/17/17 1657 11/17/17 2041 11/18/17 0736 11/18/17 1157  GLUCAP 243* 149* 227* 185* 237*    Principal Problem:   Pyelonephritis Active Problems:   Diabetes mellitus with complication ([place])   Cholelithiasis   Chronic kidney disease (CKD),  stage IV (severe) ([place])   Aortic atherosclerosis ([place])   Time coordinating discharge: 35 minutes  Signed:  Murray Hodgkins, MD [facility] 11/18/2017, 5:42 PM

## 2017-11-20 ENCOUNTER — Telehealth: Payer: Self-pay | Admitting: *Deleted

## 2017-11-20 NOTE — Telephone Encounter (Signed)
Called pt concerning hosp d/c 11/18/17. Pt states she will call back  This afternoon to make the appt once her daughter gets home because she will be the once bringing her...[name]

## 2017-11-21 NOTE — Telephone Encounter (Signed)
Transition Care Management Follow-up Telephone Call   Date discharged? 11/18/17   How have you been since you were released from the hospital? Pt states she is doing fine   Do you understand why you were in the hospital? YES   Do you understand the discharge instructions? YES   Where were you discharged to? YES   Items Reviewed:  Medications reviewed: YES  Allergies reviewed: YES  Dietary changes reviewed: YES, diabetic and low sodium  Referrals reviewed: No referral needed   Functional Questionnaire:   Activities of Daily Living (ADLs):   She states she are independent in the following: bathing and hygiene, feeding, continence, grooming, toileting and dressing States they require assistance with the following: ambulation   Any transportation issues/concerns?: NO   Any patient concerns? NO   Confirmed importance and date/time of follow-up visits scheduled YES, appt 11/23/17  Provider Appointment booked with Dr. [name]  Confirmed with patient if condition begins to worsen call PCP or go to the ER.  Patient was given the office number and encouraged to call back with question or concerns.  : YES

## 2017-11-23 ENCOUNTER — Encounter: Payer: Self-pay | Admitting: Internal Medicine

## 2017-11-23 ENCOUNTER — Ambulatory Visit (INDEPENDENT_AMBULATORY_CARE_PROVIDER_SITE_OTHER): Payer: Medicare Other | Admitting: Internal Medicine

## 2017-11-23 ENCOUNTER — Other Ambulatory Visit (INDEPENDENT_AMBULATORY_CARE_PROVIDER_SITE_OTHER): Payer: Medicare Other

## 2017-11-23 ENCOUNTER — Ambulatory Visit (INDEPENDENT_AMBULATORY_CARE_PROVIDER_SITE_OTHER)
Admission: RE | Admit: 2017-11-23 | Discharge: 2017-11-23 | Disposition: A | Discharge status: Home / Self Care | Payer: Medicare Other | Source: Ambulatory Visit | Attending: Internal Medicine | Admitting: Internal Medicine

## 2017-11-23 VITALS — BP 120/86 | HR 70 | Temp 98.2°F | Ht 61.0 in | Wt 189.0 lb

## 2017-11-23 DIAGNOSIS — R1011 Right upper quadrant pain: Secondary | ICD-10-CM | POA: Diagnosis not present

## 2017-11-23 DIAGNOSIS — N185 Chronic kidney disease, stage 5: Secondary | ICD-10-CM

## 2017-11-23 DIAGNOSIS — E119 Type 2 diabetes mellitus without complications: Secondary | ICD-10-CM

## 2017-11-23 DIAGNOSIS — E781 Pure hyperglyceridemia: Secondary | ICD-10-CM | POA: Diagnosis not present

## 2017-11-23 DIAGNOSIS — E1022 Type 1 diabetes mellitus with diabetic chronic kidney disease: Secondary | ICD-10-CM

## 2017-11-23 DIAGNOSIS — D539 Nutritional anemia, unspecified: Secondary | ICD-10-CM

## 2017-11-23 DIAGNOSIS — E118 Type 2 diabetes mellitus with unspecified complications: Secondary | ICD-10-CM

## 2017-11-23 DIAGNOSIS — N2889 Other specified disorders of kidney and ureter: Secondary | ICD-10-CM | POA: Diagnosis not present

## 2017-11-23 DIAGNOSIS — D510 Vitamin B12 deficiency anemia due to intrinsic factor deficiency: Secondary | ICD-10-CM | POA: Diagnosis not present

## 2017-11-23 DIAGNOSIS — D508 Other iron deficiency anemias: Secondary | ICD-10-CM

## 2017-11-23 DIAGNOSIS — R10811 Right upper quadrant abdominal tenderness: Secondary | ICD-10-CM

## 2017-11-23 DIAGNOSIS — E785 Hyperlipidemia, unspecified: Secondary | ICD-10-CM

## 2017-11-23 LAB — AMYLASE: Amylase: 52 U/L (ref 27–131)

## 2017-11-23 LAB — COMPREHENSIVE METABOLIC PANEL
ALK PHOS: 127 U/L — AB (ref 39–117)
ALT: 16 U/L (ref 0–35)
AST: 10 U/L (ref 0–37)
Albumin: 3.9 g/dL (ref 3.5–5.2)
BUN: 63 mg/dL — ABNORMAL HIGH (ref 6–23)
CO2: 28 meq/L (ref 19–32)
Calcium: 9.7 mg/dL (ref 8.4–10.5)
Chloride: 99 mEq/L (ref 96–112)
Creatinine, Ser: 2.7 mg/dL — ABNORMAL HIGH (ref 0.40–1.20)
GFR: 22.19 mL/min — AB (ref >60.00)
Glucose, Bld: 245 mg/dL — ABNORMAL HIGH (ref 70–99)
POTASSIUM: 4.4 meq/L (ref 3.5–5.1)
Sodium: 138 mEq/L (ref 135–145)
Total Bilirubin: 0.4 mg/dL (ref 0.2–1.2)
Total Protein: 7.4 g/dL (ref 6.0–8.3)

## 2017-11-23 LAB — HEPATIC FUNCTION PANEL
ALBUMIN: 3.9 g/dL (ref 3.5–5.2)
ALK PHOS: 124 U/L — AB (ref 39–117)
ALT: 19 U/L (ref 0–35)
AST: 15 U/L (ref 0–37)
Bilirubin, Direct: 0.1 mg/dL (ref 0.0–0.3)
Total Bilirubin: 0.4 mg/dL (ref 0.2–1.2)
Total Protein: 7.4 g/dL (ref 6.0–8.3)

## 2017-11-23 LAB — CBC WITH DIFFERENTIAL/PLATELET
Basophils Absolute: 0.1 10*3/uL (ref 0.0–0.1)
Basophils Relative: 1.2 % (ref 0.0–3.0)
EOS PCT: 6.3 % — AB (ref 0.0–5.0)
Eosinophils Absolute: 0.6 10*3/uL (ref 0.0–0.7)
HCT: 37.7 % (ref 36.0–46.0)
Hemoglobin: 12.4 g/dL (ref 12.0–15.0)
LYMPHS ABS: 1.3 10*3/uL (ref 0.7–4.0)
Lymphocytes Relative: 14.3 % (ref 12.0–46.0)
MCHC: 32.9 g/dL (ref 30.0–36.0)
MCV: 83.5 fl (ref 78.0–100.0)
Monocytes Absolute: 0.5 10*3/uL (ref 0.1–1.0)
Monocytes Relative: 6.2 % (ref 3.0–12.0)
NEUTROS ABS: 6.4 10*3/uL (ref 1.4–7.7)
NEUTROS PCT: 72 % (ref 43.0–77.0)
PLATELETS: 411 10*3/uL — AB (ref 150.0–400.0)
RBC: 4.52 Mil/uL (ref 3.87–5.11)
RDW: 15.6 % — AB (ref 11.5–15.5)
WBC: 8.9 10*3/uL (ref 4.0–10.5)

## 2017-11-23 LAB — BASIC METABOLIC PANEL
BUN: 63 mg/dL — AB (ref 6–23)
CALCIUM: 10 mg/dL (ref 8.4–10.5)
CO2: 25 mEq/L (ref 19–32)
CREATININE: 2.64 mg/dL — AB (ref 0.40–1.20)
Chloride: 98 mEq/L (ref 96–112)
GFR: 22.77 mL/min — AB (ref >60.00)
Glucose, Bld: 242 mg/dL — ABNORMAL HIGH (ref 70–99)
Potassium: 4.4 mEq/L (ref 3.5–5.1)
SODIUM: 138 meq/L (ref 135–145)

## 2017-11-23 LAB — URINALYSIS, ROUTINE W REFLEX MICROSCOPIC
BILIRUBIN URINE: NEGATIVE
HGB URINE DIPSTICK: NEGATIVE
Ketones, ur: NEGATIVE
NITRITE: NEGATIVE
Specific Gravity, Urine: 1.01 (ref 1.000–1.030)
Urine Glucose: NEGATIVE
Urobilinogen, UA: 0.2 (ref 0.0–1.0)
pH: 6 (ref 5.0–8.0)

## 2017-11-23 LAB — LIPID PANEL
CHOL/HDL RATIO: 4
Cholesterol: 173 mg/dL (ref 0–200)
HDL: 46.6 mg/dL (ref >39.00)
LDL Cholesterol: 93 mg/dL (ref 0–99)
NONHDL: 126.28
Triglycerides: 164 mg/dL — ABNORMAL HIGH (ref 0.0–149.0)
VLDL: 32.8 mg/dL (ref 0.0–40.0)

## 2017-11-23 LAB — MICROALBUMIN / CREATININE URINE RATIO
Creatinine,U: 22.3 mg/dL
Microalb Creat Ratio: 86.7 mg/g — ABNORMAL HIGH (ref 0.0–30.0)
Microalb, Ur: 19.3 mg/dL — ABNORMAL HIGH (ref 0.0–1.9)

## 2017-11-23 LAB — FOLATE: Folate: 8.8 ng/mL (ref >5.9)

## 2017-11-23 LAB — HEMOGLOBIN A1C: HEMOGLOBIN A1C: 6.9 % — AB (ref 4.6–6.5)

## 2017-11-23 LAB — VITAMIN B12: Vitamin B-12: 1254 pg/mL — ABNORMAL HIGH (ref 211–911)

## 2017-11-23 LAB — LIPASE: LIPASE: 85 U/L — AB (ref 11.0–59.0)

## 2017-11-23 NOTE — Progress Notes (Signed)
Subjective:  Patient ID: [name], female    DOB: [birth year]  Age: 72 y.o. MRN: [id number]  CC: Hospitalization Follow-up (TCM call made)   HPI [name] presents for hosp f/up -she was recently admitted for pyelonephritis.  She was initially treated with IV antibiotics and then discharged on Ceftin.  She was feeling better until earlier today when she ate a breakfast with sausage in it and since then has had diffuse pain in her right upper abdomen that extends into her right flank.  During the admission she had an ultrasound that documented cholelithiasis and concern for cysts/tumors in both kidneys.  She denies nausea, vomiting, fever, chills, coughing, diarrhea, constipation, dysuria, or hematuria.  1. Resolution of acute pyelonephritis 2. 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 Could consider further evaluation as an outpatient.     Outpatient Medications Prior to Visit  Medication Sig Dispense Refill  . acetaminophen (TYLENOL) 500 MG tablet Take 1-2 tablets (500-1,000 mg total) by mouth every 8 (eight) hours as needed for mild pain.    Marland Kitchen allopurinol (ZYLOPRIM) 100 MG tablet Take 100 mg by mouth daily.    Marland Kitchen amLODipine (NORVASC) 10 MG tablet TAKE ONE (1) TABLET EACH DAY 90 tablet 1  . aspirin 81 MG tablet Take 81 mg by mouth daily.    Marland Kitchen atorvastatin (LIPITOR) 80 MG tablet Take 1 tablet (80 mg total) by mouth daily at 6 PM. 90 tablet 3  . BD PEN NEEDLE NANO U/F 32G X 4 MM MISC USE DAILY WITH LANTUS SOLASTAR 100 each 11  . blood glucose meter kit and supplies KIT Dispense based on patient and insurance preference. Use up to three times daily as directed. (FOR ICD-9 250.00, 250.01). 1 each 0  . Blood Glucose Monitoring Suppl (ACCU-CHEK AVIVA PLUS) w/Device KIT USE AS DIRECTED ONCE DAILY 1 kit 0  . calcitRIOL (ROCALTROL) 0.25 MCG capsule Take 0.25 mcg by mouth daily.    . cefUROXime (CEFTIN) 500 MG tablet Take 1  tablet (500 mg total) by mouth at bedtime for 8 doses. 8 tablet 0  . clopidogrel (PLAVIX) 75 MG tablet Take 75 mg by mouth daily.    . Cyanocobalamin (B-12 PO) Take 1 tablet by mouth daily.    . ferrous sulfate 325 (65 FE) MG tablet TAKE 1 TABLET (325 MG TOTAL) BY MOUTH 2 (TWO) TIMES DAILY. 180 tablet 1  . furosemide (LASIX) 80 MG tablet Take 0.5 tablets (40 mg total) by mouth 2 (two) times daily.    Marland Kitchen glucose blood test strip ACCU CHECK - Use as instructed once daily 100 each 12  . HUMALOG KWIKPEN 100 UNIT/ML KiwkPen Inject 0-0.09 mLs (0-9 Units total) into the skin 2 (two) times daily. Per sliding scale CBG LESS THAN 70= EAT OR DRINK SWEET + RECHECK CBG 70-120=0 CBG 121-150=1 CBG 151-200=2 CBG 201-250=3 CBG 251-300=5 CBG 301-350=7 CBG 351-400=9 CBG GREATER THAN 400 CALL DOCTOR 15 mL 1  . metoprolol tartrate (LOPRESSOR) 100 MG tablet TAKE 1 TABLET BY MOUTH TWICE A DAY 180 tablet 3  . omeprazole (PRILOSEC) 20 MG capsule TAKE 1 CAPSULE (20 MG TOTAL) BY MOUTH DAILY. 90 capsule 1  . potassium chloride SA (K-DUR,KLOR-CON) 20 MEQ tablet Take 20 mEq by mouth daily.    . risedronate (ACTONEL) 150 MG tablet TAKE 1 TABLET BY MOUTH ONCE A MONTH-TAKE ON THE SAME DAY OF EACH MONTH. 1 tablet 5  . Tetrahydrozoline HCl (VISINE OP) Place 2 drops  into both eyes daily as needed (DRY EYE).    Marland Kitchen timolol (BETIMOL) 0.5 % ophthalmic solution Place 1 drop into both eyes daily.     . timolol (TIMOPTIC) 0.5 % ophthalmic solution PLACE 1 DROP IN BOTH EYES DAILY  3  . tiZANidine (ZANAFLEX) 4 MG tablet TAKE 1 TABLET (4 MG TOTAL) BY MOUTH EVERY 6 (SIX) HOURS AS NEEDED FOR MUSCLE SPASMS. 60 tablet 1  . traMADol (ULTRAM) 50 MG tablet Take 1 tablet (50 mg total) by mouth every 6 (six) hours as needed. 40 tablet 1  . zolpidem (AMBIEN) 5 MG tablet Take 5 mg by mouth at bedtime as needed for sleep.    . Lancets MISC Use as directed three times daily with meals (Patient not taking: Reported on 11/23/2017) 100 each 3  . nitroGLYCERIN  (NITROSTAT) 0.4 MG SL tablet Place 0.4 mg under the tongue every 5 (five) minutes as needed for chest pain. Reported on 04/06/2016    . pioglitazone (ACTOS) 45 MG tablet TAKE 1 TABLET BY MOUTH EVERY DAY (Patient not taking: Reported on 11/16/2017) 90 tablet 3   No facility-administered medications prior to visit.     ROS Review of Systems  Constitutional: Negative.  Negative for appetite change, diaphoresis, fatigue, fever and unexpected weight change.  HENT: Negative.  Negative for trouble swallowing.   Eyes: Negative.   Respiratory: Negative for cough, chest tightness, shortness of breath and wheezing.   Cardiovascular: Negative for chest pain, palpitations and leg swelling.  Gastrointestinal: Positive for abdominal pain. Negative for constipation, diarrhea, nausea and vomiting.  Endocrine: Negative.   Genitourinary: Positive for flank pain. Negative for decreased urine volume, difficulty urinating, dysuria, hematuria and urgency.  Skin: Negative.  Negative for rash.  Allergic/Immunologic: Negative.   Neurological: Negative.   Hematological: Negative for adenopathy. Does not bruise/bleed easily.  Psychiatric/Behavioral: Negative.     Objective:  BP 120/86 (BP Location: Left Arm, Patient Position: Sitting, Cuff Size: Large)   Pulse 70   Temp 98.2 F (36.8 C) (Oral)   Ht '5\' 1"'$  (1.549 m)   Wt 189 lb (85.7 kg)   SpO2 96%   BMI 35.71 kg/m   BP Readings from Last 3 Encounters:  11/23/17 120/86  11/18/17 (!) 162/77  11/15/17 118/84    Wt Readings from Last 3 Encounters:  11/23/17 189 lb (85.7 kg)  11/18/17 186 lb 11.7 oz (84.7 kg)  11/15/17 197 lb 0.6 oz (89.4 kg)    Physical Exam  Constitutional: She is oriented to person, place, and time.  Non-toxic appearance. She does not have a sickly appearance. She does not appear ill. No distress.  HENT:  Mouth/Throat: Oropharynx is clear and moist. No oropharyngeal exudate.  Eyes: Conjunctivae are normal. Left eye exhibits no  discharge. No scleral icterus.  Neck: Normal range of motion. Neck supple. No JVD present. No thyromegaly present.  Cardiovascular: Normal rate, regular rhythm and normal heart sounds. Exam reveals no gallop.  No murmur heard. Pulmonary/Chest: Effort normal and breath sounds normal. No respiratory distress. She has no wheezes. She has no rales.  Abdominal: Soft. Normal appearance. She exhibits no shifting dullness, no ascites and no mass. Bowel sounds are decreased. There is no hepatosplenomegaly, splenomegaly or hepatomegaly. There is tenderness in the right upper quadrant. There is CVA tenderness (rt).  Musculoskeletal: Normal range of motion. She exhibits no edema, tenderness or deformity.  Lymphadenopathy:    She has no cervical adenopathy.  Neurological: She is alert and oriented to person, place, and  time.  Skin: Skin is warm and dry. No rash noted. She is not diaphoretic. No erythema. No pallor.  Vitals reviewed.   Lab Results  Component Value Date   WBC 8.9 11/23/2017   HGB 12.4 11/23/2017   HCT 37.7 11/23/2017   PLT 411.0 (H) 11/23/2017   GLUCOSE 245 (H) 11/23/2017   GLUCOSE 242 (H) 11/23/2017   CHOL 173 11/23/2017   TRIG 164.0 (H) 11/23/2017   HDL 46.60 11/23/2017   LDLDIRECT 105.0 06/08/2017   LDLCALC 93 11/23/2017   ALT 16 11/23/2017   ALT 19 11/23/2017   AST 10 11/23/2017   AST 15 11/23/2017   NA 138 11/23/2017   NA 138 11/23/2017   K 4.4 11/23/2017   K 4.4 11/23/2017   CL 99 11/23/2017   CL 98 11/23/2017   CREATININE 2.70 (H) 11/23/2017   CREATININE 2.64 (H) 11/23/2017   BUN 63 (H) 11/23/2017   BUN 63 (H) 11/23/2017   CO2 28 11/23/2017   CO2 25 11/23/2017   TSH 3.28 06/08/2017   HGBA1C 6.9 (H) 11/23/2017   MICROALBUR 19.3 (H) 11/23/2017    Mr Abdomen Wo Contrast  Result Date: 11/16/2017 CLINICAL DATA:  Right flank pain. End-stage renal disease. Questionable lower right renal mass on abdominal sonogram. EXAM: MRI ABDOMEN WITHOUT CONTRAST TECHNIQUE:  Multiplanar multisequence MR imaging was performed without the administration of intravenous contrast. COMPARISON:  11/16/2017 abdominal sonogram. FINDINGS: Examination is significantly limited by noncontrast technique and motion artifact. Lower chest: Subsegmental scarring versus atelectasis at the dependent lung bases. Hepatobiliary: Normal liver size and configuration. No hepatic steatosis. Two subcentimeter T2 hyperintense T1 hypointense right liver lobe lesions are incompletely characterized on this noncontrast scan, most likely to represent simple liver cysts. No additional liver lesions. Gallbladder is mildly distended. Subcentimeter gallstones are layering in the gallbladder. No gallbladder wall thickening or pericholecystic fluid. No biliary ductal dilatation. Common bile duct diameter 5 mm. No evidence of choledocholithiasis. Pancreas: No pancreatic mass or duct dilation.  No pancreas divisum. Spleen: Normal size spleen. No splenic mass. Loss of signal intensity on inphase chemical shift imaging in the spleen is compatible with splenic iron deposition, probably transfusion related. Adrenals/Urinary Tract: Normal adrenals. No hydronephrosis. Severe asymmetric global left renal atrophy. There is severe renal parenchymal scarring in the lower pole of the right kidney. No discrete right renal mass. Small simple appearing renal cysts in the left kidney, largest 1.0 cm. Stomach/Bowel: Grossly normal stomach. Visualized small and large bowel is normal caliber, with no bowel wall thickening. Vascular/Lymphatic: Normal caliber abdominal aorta. No pathologically enlarged lymph nodes in the abdomen. Other: No abdominal ascites or focal fluid collection. Musculoskeletal: No aggressive appearing focal osseous lesions. IMPRESSION: 1. Severe scarring in the lower pole of the right kidney. No discrete right renal mass on this non-contrast MRI. No hydronephrosis. 2. Asymmetric severe global left renal atrophy. Small simple  appearing left renal cysts. 3. Cholelithiasis. Distended gallbladder. No specific findings of acute cholecystitis. No biliary ductal dilatation. 4. 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 Electronically Signed   By: Ilona Sorrel M.D.   On: 11/16/2017 21:24   US Abdomen Complete  Result Date: 11/16/2017 CLINICAL DATA:  Upper abdominal pain EXAM: ABDOMEN ULTRASOUND COMPLETE COMPARISON:  None. FINDINGS: Gallbladder: Within the gallbladder, there are echogenic foci which move and shadow consistent with cholelithiasis. Largest gallstone measures 1.4 cm in length. There is no appreciable gallbladder wall thickening or pericholecystic fluid. No sonographic Murphy sign noted by sonographer. Common bile duct: Diameter:  3 mm. No intrahepatic, common hepatic, or common bile duct dilatation. Liver: No focal lesion identified. Within normal limits in parenchymal echogenicity. Portal vein is patent on color Doppler imaging with normal direction of blood flow towards the liver. IVC: No abnormality visualized. Pancreas: No pancreatic mass or inflammatory focus. Spleen: Size and appearance within normal limits. Right Kidney: Length: 8.8 cm. Echogenicity within normal limits. No hydronephrosis visualized. There is a questionable lesion arising from the lower pole of the right kidney measuring approximately 4 x 4 cm. This area of questionable lesion arising from the lower pole the right kidney is somewhat ill-defined. A well-defined mass by ultrasound is not seen in this area. Left Kidney: Length: 6.6 cm. Echogenicity is increased with renal cortical thinning. No hydronephrosis visualized. There is a cyst arising from the upper pole of the right kidney measuring 1.7 x 1.2 x 0.9 cm. Abdominal aorta: No aneurysm visualized. Other findings: No demonstrable ascites. IMPRESSION: 1. Questionable mass arising from the lower pole right kidney. This finding warrants further evaluation with either pre and  post-contrast CT or MR nonemergently to further evaluate. Right kidney overall is rather small. 2. Somewhat atrophic appearing left kidney with renal cortical thinning and increased renal echogenicity. Small cyst in left kidney but no hydronephrosis. The appearance of the left kidney may be indicative of chronic renal artery stenosis. In this regard, question whether patient is hypertensive. 3. Cholelithiasis. No gallbladder wall thickening or pericholecystic fluid. 4.  Study otherwise unremarkable. Electronically Signed   By: Lowella Grip III M.D.   On: 11/16/2017 15:25    Dg Abd Acute W/chest  Result Date: 11/23/2017 CLINICAL DATA:  RUQ pain x 7 days, no chest complains, HTN, DM, hx of CAD, stents, x-smoker. EXAM: DG ABDOMEN ACUTE W/ 1V CHEST COMPARISON:  11/15/2017 FINDINGS: Heart size is normal. The lungs are free of focal consolidations and pleural effusions. No pulmonary edema. Mild bibasilar atelectasis. Moderate stool burden is noted throughout nondilated loops of colon. No evidence for organomegaly. Scattered phleboliths in the pelvis. There is atherosclerotic calcification of the abdominal aorta. IMPRESSION: 1. Mild bibasilar atelectasis. 2. Significant stool burden. 3.  Aortic atherosclerosis.  (ICD10-I70.0) Electronically Signed   By: Nolon Nations M.D.   On: 11/23/2017 11:49     Assessment & Plan:   [name] was seen today for hospitalization follow-up.  Diagnoses and all orders for this visit:  Vitamin B12 deficiency anemia due to intrinsic factor deficiency- Her H&H and vitamin B12 are normal now. -     Cancel: CBC with Differential/Platelet; Future -     Vitamin B12; Future -     Folate; Future -     CBC with Differential/Platelet; Future  Other iron deficiency anemia- Her H&H are normal.  She has very mild iron deficiency anemia. 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  She is  prerenal.  She appears dehydrated but during her office visit today she was not willing to be admitted for IV fluids. -     Basic metabolic panel; Future -     Urinalysis, Routine w reflex microscopic; Future -     Comprehensive metabolic panel; Future  Diabetes mellitus with complication ([place])- Her Y4M is at 6.8%.  Her blood sugars are adequately well controlled. -     Basic metabolic panel; Future -     Hemoglobin A1c; Future -     Microalbumin / creatinine  urine ratio; Future -     Comprehensive metabolic panel; Future  Deficiency anemia -     Cancel: CBC with Differential/Platelet; Future -     Vitamin B1; Future  Pure hyperglyceridemia-improvement noted. -     Lipid panel; Future  Hyperlipidemia LDL goal <130 -     Lipid panel; Future  Right upper quadrant abdominal tenderness without rebound tenderness- She has right upper quadrant and right flank pain.  Her urinalysis appears better than it did when she was recently treated for pyelonephritis so I do not think she has pyelonephritis again.  I am concerned she may have a complication of cholelithiasis or pancreatitis as her lipase is higher than it was 3 weeks ago.  I have advised her and her caretaker of these results.  I recommended that she be readmitted for further evaluation if her symptoms do not improve within the next day or 2.  In the meantime she will let me know if she develops any new or worsening symptoms. -     Comprehensive metabolic panel; Future -     CBC with Differential/Platelet; Future -     Amylase; Future -     Lipase; Future -     DG Abd Acute W/Chest; Future  Renal mass- These appear to be simple cysts on the ultrasound.  I anticipate she will undergo a CT without contrast soon which will give more information about these lesions. -     Ambulatory referral to Urology   I have discontinued [name]'s pioglitazone. I am also having her maintain her timolol, aspirin, BD PEN NEEDLE NANO U/F,  clopidogrel, nitroGLYCERIN, blood glucose meter kit and supplies, ferrous sulfate, glucose blood, Lancets, atorvastatin, ACCU-CHEK AVIVA PLUS, allopurinol, calcitRIOL, acetaminophen, furosemide, traMADol, zolpidem, potassium chloride SA, Cyanocobalamin (B-12 PO), Tetrahydrozoline HCl (VISINE OP), metoprolol tartrate, amLODipine, omeprazole, tiZANidine, HUMALOG KWIKPEN, risedronate, timolol, and cefUROXime.  No orders of the defined types were placed in this encounter.    Follow-up: No Follow-up on file.  Scarlette Calico, MD

## 2017-11-23 NOTE — Patient Instructions (Signed)

## 2017-11-27 ENCOUNTER — Encounter: Payer: Self-pay | Admitting: Internal Medicine

## 2017-11-27 ENCOUNTER — Other Ambulatory Visit: Payer: Self-pay | Admitting: Internal Medicine

## 2017-11-27 DIAGNOSIS — N281 Cyst of kidney, acquired: Secondary | ICD-10-CM

## 2017-11-27 DIAGNOSIS — N2889 Other specified disorders of kidney and ureter: Secondary | ICD-10-CM

## 2017-11-27 DIAGNOSIS — D539 Nutritional anemia, unspecified: Secondary | ICD-10-CM

## 2017-11-27 LAB — VITAMIN B1: VITAMIN B1 (THIAMINE): 9 nmol/L (ref 8–30)

## 2017-11-27 MED ORDER — VITAMIN B-1 50 MG PO TABS: 50.0000 mg | ORAL_TABLET | Freq: Every day | ORAL | 1 refills | Status: DC

## 2017-12-04 ENCOUNTER — Other Ambulatory Visit: Payer: Self-pay | Admitting: Cardiovascular Disease

## 2017-12-04 MED ORDER — NITROGLYCERIN 0.4 MG SL SUBL: 0.4000 mg | SUBLINGUAL_TABLET | SUBLINGUAL | 5 refills | Status: AC | PRN

## 2017-12-12 ENCOUNTER — Other Ambulatory Visit: Payer: Self-pay | Admitting: Internal Medicine

## 2017-12-13 ENCOUNTER — Ambulatory Visit (INDEPENDENT_AMBULATORY_CARE_PROVIDER_SITE_OTHER): Payer: Medicare Other | Admitting: Internal Medicine

## 2017-12-13 ENCOUNTER — Encounter: Payer: Self-pay | Admitting: Internal Medicine

## 2017-12-13 VITALS — BP 126/84 | HR 76 | Temp 97.6°F | Ht 61.0 in | Wt 192.0 lb

## 2017-12-13 DIAGNOSIS — K8020 Calculus of gallbladder without cholecystitis without obstruction: Secondary | ICD-10-CM | POA: Diagnosis not present

## 2017-12-13 DIAGNOSIS — E1022 Type 1 diabetes mellitus with diabetic chronic kidney disease: Secondary | ICD-10-CM | POA: Diagnosis not present

## 2017-12-13 DIAGNOSIS — G8929 Other chronic pain: Secondary | ICD-10-CM | POA: Diagnosis not present

## 2017-12-13 DIAGNOSIS — N185 Chronic kidney disease, stage 5: Secondary | ICD-10-CM

## 2017-12-13 DIAGNOSIS — Z0001 Encounter for general adult medical examination with abnormal findings: Secondary | ICD-10-CM | POA: Diagnosis not present

## 2017-12-13 DIAGNOSIS — E118 Type 2 diabetes mellitus with unspecified complications: Secondary | ICD-10-CM | POA: Diagnosis not present

## 2017-12-13 DIAGNOSIS — R10811 Right upper quadrant abdominal tenderness: Secondary | ICD-10-CM

## 2017-12-13 DIAGNOSIS — K5900 Constipation, unspecified: Secondary | ICD-10-CM | POA: Diagnosis not present

## 2017-12-13 MED ORDER — DICLOFENAC SODIUM 1 % TD GEL: 4.0000 g | Freq: Four times a day (QID) | TRANSDERMAL | 11 refills | Status: DC | PRN

## 2017-12-13 MED ORDER — GLUCOSE BLOOD VI STRP: ORAL_STRIP | 12 refills | Status: DC

## 2017-12-13 MED ORDER — POLYETHYLENE GLYCOL 3350 17 GM/SCOOP PO POWD: ORAL | 11 refills | Status: AC

## 2017-12-13 NOTE — Assessment & Plan Note (Addendum)
Recent lab eval and MRI neg for acute, has known cholelithiasis but lft's normal, o/w low suspicion for acute GB, suspect more likely related to constipation though cant completely r/o passed Gallstone with mild elevated lipase or pancreatitis for other reason; declines GI eval for now  In addition to the time spent performing CPE, I spent an additional 25 minutes face to face,in which greater than 50% of this time was spent in counseling and coordination of care for patient's acute illness as documented, including the differential dx, treatment, further evaluation and other management of RUQ pain, cholelithiasis, DM, ESRD, constipatain and arthritis pain

## 2017-12-13 NOTE — Assessment & Plan Note (Signed)
stable overall by history and exam, recent data reviewed with pt, and pt to continue medical treatment as before,  to f/u any worsening symptoms or concerns  

## 2017-12-13 NOTE — Assessment & Plan Note (Signed)

## 2017-12-13 NOTE — Assessment & Plan Note (Signed)
No evidence for acute GB, exam benign, ok to follow

## 2017-12-13 NOTE — Patient Instructions (Addendum)
Please take all new medication as prescribed - the voltaren gel, as well as the Miralax daily  Please continue all other medications as before, and refills have been done if requested.  Please have the pharmacy call with any other refills you may need.  Please continue your efforts at being more active, low cholesterol diet, and weight control.  You are otherwise up to date with prevention measures today.  Please keep your appointments with your specialists as you may have planned  No further lab work on imaging needed today  Please return in 6 months, or sooner if needed, with Lab testing done 3-5 days before

## 2017-12-13 NOTE — Assessment & Plan Note (Signed)
Ok for [employer] daily asd,  to f/u any worsening symptoms or concerns

## 2017-12-13 NOTE — Progress Notes (Signed)
Subjective:    Patient ID: [name], female    DOB: [birth year], 72 y.o.   MRN: [id number]  HPI  Here for wellness and f/u;  Now living with daughter in her home due to some recent worsening ability of pt to care for herself and take all meds, o/w overall doing ok;  Pt denies Chest pain, worsening SOB, DOE, wheezing, orthopnea, PND, worsening LE edema, palpitations, dizziness or syncope.  Pt denies neurological change such as new headache, facial or extremity weakness.  Pt denies polydipsia, polyuria, or low sugar symptoms. Pt states overall good compliance with treatment and medications, good tolerability, and has been trying to follow appropriate diet.  Pt denies worsening depressive symptoms, suicidal ideation or panic. No fever, night sweats, wt loss, loss of appetite, or other constitutional symptoms.  Pt states good ability with ADL's, has moderate fall risk, home safety reviewed and adequate, no other significant changes in hearing or vision, and not active with exercise.  Also has not yet started HD for end stage renal fxn, cont;s to f/u with renal. Did have multiple cbgs recently about 200.  Had a low sugar 51 woek her up in a sweat last wk, with weak and dizzy.  Did also have recent constipation with abd pain, MRI and labs neg for acute. Also c/o bilat hand pain at the arthritic MCP's, and daughters voltaren gel seemed to help quite a bit.   No other new complaints or interval hx Past Medical History:  Diagnosis Date  . Anemia, iron deficiency   . Aortic atherosclerosis ([place]) 11/17/2017  . Arthritis   . CAD (coronary artery disease)   . Carotid stenosis    Carotid US (02/2014):  Bilateral ICA 40-59%; > 50% L ECA; F/u 1 year  . Cataract    removed both eyes  . Cholelithiasis 11/17/2017  . Chronic kidney disease    chronic renal failure  . DM2 (diabetes mellitus, type 2) ([place])   . GERD (gastroesophageal reflux disease)   . Glaucoma   . HLD (hyperlipidemia)   . HTN (hypertension)    . Hx of cardiovascular stress test    Lexiscan Myoview (03/24/14):  No ischemia, EF 73%, Normal  . Hypoglycemia 06/27/2017  . Mitral regurgitation   . Osteopenia 07/21/2017  . Osteoporosis   . PUD (peptic ulcer disease)   . PVD (peripheral vascular disease) ([place])   . Shortness of breath    with exertion   Past Surgical History:  Procedure Laterality Date  . ABDOMINAL HYSTERECTOMY    . APPENDECTOMY    . AV FISTULA PLACEMENT Right 01/18/2013   Procedure: ARTERIOVENOUS (AV) FISTULA CREATION;  Surgeon: Rosetta Posner, MD;  Location: [facility];  Service: Vascular;  Laterality: Right;  Ultrasound guided  . COLONOSCOPY    . CORONARY ANGIOPLASTY WITH STENT PLACEMENT    . RENAL ANGIOGRAM N/A 09/16/2011   Procedure: RENAL ANGIOGRAM;  Surgeon: Sherren Mocha, MD;  Location: [facility] CATH LAB;  Service: Cardiovascular;  Laterality: N/A;    reports that she quit smoking about 10 years ago. she has never used smokeless tobacco. She reports that she does not drink alcohol or use drugs. family history includes Diabetes in her sister and son; Glaucoma in her father; Heart disease in her father. No Known Allergies Current Outpatient Medications on File Prior to Visit  Medication Sig Dispense Refill  . acetaminophen (TYLENOL) 500 MG tablet Take 1-2 tablets (500-1,000 mg total) by mouth every 8 (eight) hours as needed for mild  pain.    . allopurinol (ZYLOPRIM) 100 MG tablet Take 100 mg by mouth daily.    Marland Kitchen amLODipine (NORVASC) 10 MG tablet TAKE ONE (1) TABLET EACH DAY 90 tablet 1  . aspirin 81 MG tablet Take 81 mg by mouth daily.    Marland Kitchen atorvastatin (LIPITOR) 80 MG tablet Take 1 tablet (80 mg total) by mouth daily at 6 PM. 90 tablet 3  . BD PEN NEEDLE NANO U/F 32G X 4 MM MISC USE DAILY WITH LANTUS SOLASTAR 100 each 11  . blood glucose meter kit and supplies KIT Dispense based on patient and insurance preference. Use up to three times daily as directed. (FOR ICD-9 250.00, 250.01). 1 each 0  . Blood Glucose Monitoring  Suppl (ACCU-CHEK AVIVA PLUS) w/Device KIT USE AS DIRECTED ONCE DAILY 1 kit 0  . calcitRIOL (ROCALTROL) 0.25 MCG capsule Take 0.25 mcg by mouth daily.    . clopidogrel (PLAVIX) 75 MG tablet Take 75 mg by mouth daily.    . Cyanocobalamin (B-12 PO) Take 1 tablet by mouth daily.    . ferrous sulfate 325 (65 FE) MG tablet TAKE 1 TABLET (325 MG TOTAL) BY MOUTH 2 (TWO) TIMES DAILY. 180 tablet 1  . furosemide (LASIX) 80 MG tablet Take 0.5 tablets (40 mg total) by mouth 2 (two) times daily.    Marland Kitchen HUMALOG KWIKPEN 100 UNIT/ML KiwkPen Inject 0-0.09 mLs (0-9 Units total) into the skin 2 (two) times daily. Per sliding scale CBG LESS THAN 70= EAT OR DRINK SWEET + RECHECK CBG 70-120=0 CBG 121-150=1 CBG 151-200=2 CBG 201-250=3 CBG 251-300=5 CBG 301-350=7 CBG 351-400=9 CBG GREATER THAN 400 CALL DOCTOR 15 mL 1  . Lancets MISC Use as directed three times daily with meals 100 each 3  . metoprolol tartrate (LOPRESSOR) 100 MG tablet TAKE 1 TABLET BY MOUTH TWICE A DAY 180 tablet 3  . nitroGLYCERIN (NITROSTAT) 0.4 MG SL tablet Place 1 tablet (0.4 mg total) under the tongue every 5 (five) minutes as needed for chest pain. Reported on 04/06/2016 25 tablet 5  . omeprazole (PRILOSEC) 20 MG capsule TAKE 1 CAPSULE (20 MG TOTAL) BY MOUTH DAILY. 90 capsule 1  . potassium chloride SA (K-DUR,KLOR-CON) 20 MEQ tablet Take 20 mEq by mouth daily.    . risedronate (ACTONEL) 150 MG tablet TAKE 1 TABLET BY MOUTH ONCE A MONTH-TAKE ON THE SAME DAY OF EACH MONTH. 1 tablet 5  . Tetrahydrozoline HCl (VISINE OP) Place 2 drops into both eyes daily as needed (DRY EYE).    Marland Kitchen thiamine (VITAMIN B-1) 50 MG tablet Take 1 tablet (50 mg total) by mouth daily. 90 tablet 1  . timolol (BETIMOL) 0.5 % ophthalmic solution Place 1 drop into both eyes daily.     . timolol (TIMOPTIC) 0.5 % ophthalmic solution PLACE 1 DROP IN BOTH EYES DAILY  3  . tiZANidine (ZANAFLEX) 4 MG tablet TAKE 1 TABLET (4 MG TOTAL) BY MOUTH EVERY 6 (SIX) HOURS AS NEEDED FOR MUSCLE SPASMS.  60 tablet 1  . traMADol (ULTRAM) 50 MG tablet Take 1 tablet (50 mg total) by mouth every 6 (six) hours as needed. 40 tablet 1  . zolpidem (AMBIEN) 5 MG tablet Take 5 mg by mouth at bedtime as needed for sleep.     No current facility-administered medications on file prior to visit.    Review of Systems Constitutional: Negative for other unusual diaphoresis, sweats, appetite or weight changes HENT: Negative for other worsening hearing loss, ear pain, facial swelling, mouth sores or  neck stiffness.   Eyes: Negative for other worsening pain, redness or other visual disturbance.  Respiratory: Negative for other stridor or swelling Cardiovascular: Negative for other palpitations or other chest pain  Gastrointestinal: Negative for worsening diarrhea or loose stools, blood in stool, distention or other pain Genitourinary: Negative for hematuria, flank pain or other change in urine volume.  Musculoskeletal: Negative for myalgias or other joint swelling.  Skin: Negative for other color change, or other wound or worsening drainage.  Neurological: Negative for other syncope or numbness. Hematological: Negative for other adenopathy or swelling Psychiatric/Behavioral: Negative for hallucinations, other worsening agitation, SI, self-injury, or new decreased concentration All other system neg per pt    Objective:   Physical Exam BP 126/84   Pulse 76   Temp 97.6 F (36.4 C) (Oral)   Ht '5\' 1"'$  (1.549 m)   Wt 192 lb (87.1 kg)   SpO2 97%   BMI 36.28 kg/m  VS noted,  Constitutional: Pt is oriented to person, place, and time. Appears well-developed and well-nourished, in no significant distress and comfortable Head: Normocephalic and atraumatic  Eyes: Conjunctivae and EOM are normal. Pupils are equal, round, and reactive to light Right Ear: External ear normal without discharge Left Ear: External ear normal without discharge Nose: Nose without discharge or deformity Mouth/Throat: Oropharynx is  without other ulcerations and moist  Neck: Normal range of motion. Neck supple. No JVD present. No tracheal deviation present or significant neck LA or mass Cardiovascular: Normal rate, regular rhythm, normal heart sounds and intact distal pulses.   Pulmonary/Chest: WOB normal and breath sounds without rales or wheezing  Abdominal: Soft. Bowel sounds are normal. NT. No HSM  Musculoskeletal: Normal range of motion. Exhibits no edema Lymphadenopathy: Has no other cervical adenopathy.  Neurological: Pt is alert and oriented to person, place, and time. Pt has normal reflexes. No cranial nerve deficit. Motor grossly intact, Gait intact, ? Mild ST memory loss and reduced comprehension - seems to nod but not clear she understands Skin: Skin is warm and dry. No rash noted or new ulcerations Psychiatric:  Has normal mood and affect. Behavior is normal without agitation No other exam findings Lab Results  Component Value Date   WBC 8.9 11/23/2017   HGB 12.4 11/23/2017   HCT 37.7 11/23/2017   PLT 411.0 (H) 11/23/2017   GLUCOSE 245 (H) 11/23/2017   GLUCOSE 242 (H) 11/23/2017   CHOL 173 11/23/2017   TRIG 164.0 (H) 11/23/2017   HDL 46.60 11/23/2017   LDLDIRECT 105.0 06/08/2017   LDLCALC 93 11/23/2017   ALT 16 11/23/2017   ALT 19 11/23/2017   AST 10 11/23/2017   AST 15 11/23/2017   NA 138 11/23/2017   NA 138 11/23/2017   K 4.4 11/23/2017   K 4.4 11/23/2017   CL 99 11/23/2017   CL 98 11/23/2017   CREATININE 2.70 (H) 11/23/2017   CREATININE 2.64 (H) 11/23/2017   BUN 63 (H) 11/23/2017   BUN 63 (H) 11/23/2017   CO2 28 11/23/2017   CO2 25 11/23/2017   TSH 3.28 06/08/2017   HGBA1C 6.9 (H) 11/23/2017   MICROALBUR 19.3 (H) 11/23/2017       Assessment & Plan:

## 2017-12-13 NOTE — Assessment & Plan Note (Signed)
Today bilat hands - ok for volt gel prn,  to f/u any worsening symptoms or concerns

## 2017-12-14 DIAGNOSIS — H401124 Primary open-angle glaucoma, left eye, indeterminate stage: Secondary | ICD-10-CM | POA: Diagnosis not present

## 2017-12-14 DIAGNOSIS — H04123 Dry eye syndrome of bilateral lacrimal glands: Secondary | ICD-10-CM | POA: Diagnosis not present

## 2017-12-14 DIAGNOSIS — H401114 Primary open-angle glaucoma, right eye, indeterminate stage: Secondary | ICD-10-CM | POA: Diagnosis not present

## 2017-12-15 ENCOUNTER — Inpatient Hospital Stay (HOSPITAL_COMMUNITY)
Admission: RE | Admit: 2017-12-15 | Discharge: 2017-12-15 | Disposition: A | Discharge status: Home / Self Care | Payer: Medicare Other | Source: Ambulatory Visit | Attending: Nephrology | Admitting: Nephrology

## 2017-12-15 ENCOUNTER — Encounter (HOSPITAL_COMMUNITY): Payer: Self-pay

## 2017-12-21 ENCOUNTER — Other Ambulatory Visit: Payer: Self-pay | Admitting: Internal Medicine

## 2017-12-22 ENCOUNTER — Ambulatory Visit (INDEPENDENT_AMBULATORY_CARE_PROVIDER_SITE_OTHER): Payer: Medicare Other

## 2017-12-22 ENCOUNTER — Ambulatory Visit: Payer: Medicare Other | Admitting: Podiatry

## 2017-12-22 ENCOUNTER — Other Ambulatory Visit: Payer: Self-pay | Admitting: Podiatry

## 2017-12-22 ENCOUNTER — Encounter (HOSPITAL_COMMUNITY)
Admission: RE | Admit: 2017-12-22 | Discharge: 2017-12-22 | Disposition: A | Discharge status: Home / Self Care | Payer: Medicare Other | Source: Ambulatory Visit | Attending: Nephrology | Admitting: Nephrology

## 2017-12-22 VITALS — BP 154/77 | HR 70 | Temp 98.3°F | Resp 20

## 2017-12-22 DIAGNOSIS — M79671 Pain in right foot: Secondary | ICD-10-CM

## 2017-12-22 DIAGNOSIS — D539 Nutritional anemia, unspecified: Secondary | ICD-10-CM

## 2017-12-22 DIAGNOSIS — I739 Peripheral vascular disease, unspecified: Secondary | ICD-10-CM

## 2017-12-22 DIAGNOSIS — M79675 Pain in left toe(s): Secondary | ICD-10-CM

## 2017-12-22 DIAGNOSIS — B351 Tinea unguium: Secondary | ICD-10-CM | POA: Diagnosis not present

## 2017-12-22 DIAGNOSIS — M79674 Pain in right toe(s): Secondary | ICD-10-CM

## 2017-12-22 DIAGNOSIS — S92405S Nondisplaced unspecified fracture of left great toe, sequela: Secondary | ICD-10-CM

## 2017-12-22 DIAGNOSIS — D631 Anemia in chronic kidney disease: Secondary | ICD-10-CM | POA: Insufficient documentation

## 2017-12-22 DIAGNOSIS — N184 Chronic kidney disease, stage 4 (severe): Secondary | ICD-10-CM | POA: Diagnosis not present

## 2017-12-22 DIAGNOSIS — D689 Coagulation defect, unspecified: Secondary | ICD-10-CM | POA: Diagnosis not present

## 2017-12-22 LAB — IRON AND TIBC
Iron: 40 ug/dL (ref 28–170)
SATURATION RATIOS: 14 % (ref 10.4–31.8)
TIBC: 291 ug/dL (ref 250–450)
UIBC: 251 ug/dL

## 2017-12-22 LAB — POCT HEMOGLOBIN-HEMACUE: HEMOGLOBIN: 12.9 g/dL (ref 12.0–15.0)

## 2017-12-22 LAB — FERRITIN: Ferritin: 617 ng/mL — ABNORMAL HIGH (ref 11–307)

## 2017-12-22 MED ORDER — EPOETIN ALFA 10000 UNIT/ML IJ SOLN: 20000.0000 [IU] | INTRAMUSCULAR | Status: DC

## 2017-12-22 MED ORDER — EPOETIN ALFA 20000 UNIT/ML IJ SOLN
INTRAMUSCULAR | Status: AC
  Filled 2017-12-22: qty 1

## 2017-12-22 NOTE — Progress Notes (Signed)
Subjective:   Patient ID: [name], female   DOB: 72 y.o.   MRN: [id number]   HPI [name] presents to the office today for concerns of pain to her left fourth toe.  She states that she has had swelling and pain to the toe for the last week.  She states that she may have injured it that she cannot remember.  She said no recent treatment for this.  She did have some swelling but this did resolve.  Denies any open sores.  She also states her nails are thick, elongated she cannot trim them herself.  Denies any redness or drainage.  She also states that she does get pain to her legs when she walks.  She has no other concerns.  She is diabetic and her last blood sugar was 277.   Review of Systems  All other systems reviewed and are negative.  Past Medical History:  Diagnosis Date  . Anemia, iron deficiency   . Aortic atherosclerosis ([place]) 11/17/2017  . Arthritis   . CAD (coronary artery disease)   . Carotid stenosis    Carotid US (02/2014):  Bilateral ICA 40-59%; > 50% L ECA; F/u 1 year  . Cataract    removed both eyes  . Cholelithiasis 11/17/2017  . Chronic kidney disease    chronic renal failure  . DM2 (diabetes mellitus, type 2) ([place])   . GERD (gastroesophageal reflux disease)   . Glaucoma   . HLD (hyperlipidemia)   . HTN (hypertension)   . Hx of cardiovascular stress test    Lexiscan Myoview (03/24/14):  No ischemia, EF 73%, Normal  . Hypoglycemia 06/27/2017  . Mitral regurgitation   . Osteopenia 07/21/2017  . Osteoporosis   . PUD (peptic ulcer disease)   . PVD (peripheral vascular disease) ([place])   . Shortness of breath    with exertion    Past Surgical History:  Procedure Laterality Date  . ABDOMINAL HYSTERECTOMY    . APPENDECTOMY    . AV FISTULA PLACEMENT Right 01/18/2013   Procedure: ARTERIOVENOUS (AV) FISTULA CREATION;  Surgeon: Rosetta Posner, MD;  Location: [facility];  Service: Vascular;  Laterality: Right;  Ultrasound guided  . COLONOSCOPY    . CORONARY  ANGIOPLASTY WITH STENT PLACEMENT    . RENAL ANGIOGRAM N/A 09/16/2011   Procedure: RENAL ANGIOGRAM;  Surgeon: Sherren Mocha, MD;  Location: [facility] CATH LAB;  Service: Cardiovascular;  Laterality: N/A;     Current Outpatient Medications:  .  acetaminophen (TYLENOL) 500 MG tablet, Take 1-2 tablets (500-1,000 mg total) by mouth every 8 (eight) hours as needed for mild pain., Disp: , Rfl:  .  allopurinol (ZYLOPRIM) 100 MG tablet, Take 100 mg by mouth daily., Disp: , Rfl:  .  amLODipine (NORVASC) 10 MG tablet, TAKE ONE (1) TABLET EACH DAY, Disp: 90 tablet, Rfl: 1 .  aspirin 81 MG tablet, Take 81 mg by mouth daily., Disp: , Rfl:  .  atorvastatin (LIPITOR) 80 MG tablet, Take 1 tablet (80 mg total) by mouth daily at 6 PM., Disp: 90 tablet, Rfl: 3 .  BD PEN NEEDLE NANO U/F 32G X 4 MM MISC, USE DAILY WITH LANTUS SOLASTAR, Disp: 100 each, Rfl: 11 .  blood glucose meter kit and supplies KIT, Dispense based on patient and insurance preference. Use up to three times daily as directed. (FOR ICD-9 250.00, 250.01)., Disp: 1 each, Rfl: 0 .  Blood Glucose Monitoring Suppl (ACCU-CHEK AVIVA PLUS) w/Device KIT, USE AS DIRECTED ONCE DAILY, Disp:  1 kit, Rfl: 0 .  calcitRIOL (ROCALTROL) 0.25 MCG capsule, Take 0.25 mcg by mouth daily., Disp: , Rfl:  .  clopidogrel (PLAVIX) 75 MG tablet, Take 75 mg by mouth daily., Disp: , Rfl:  .  Cyanocobalamin (B-12 PO), Take 1 tablet by mouth daily., Disp: , Rfl:  .  diclofenac sodium (VOLTAREN) 1 % GEL, Apply 4 g topically 4 (four) times daily as needed., Disp: 400 g, Rfl: 11 .  ferrous sulfate 325 (65 FE) MG tablet, TAKE 1 TABLET (325 MG TOTAL) BY MOUTH 2 (TWO) TIMES DAILY., Disp: 180 tablet, Rfl: 1 .  ferrous sulfate 325 (65 FE) MG tablet, TAKE 1 TABLET (325 MG TOTAL) BY MOUTH 2 (TWO) TIMES DAILY., Disp: 60 tablet, Rfl: 5 .  furosemide (LASIX) 80 MG tablet, Take 0.5 tablets (40 mg total) by mouth 2 (two) times daily., Disp: , Rfl:  .  glucose blood test strip, ACCU CHECK - Use as  instructed three time daily  E11.9, Disp: 300 each, Rfl: 12 .  HUMALOG KWIKPEN 100 UNIT/ML KiwkPen, Inject 0-0.09 mLs (0-9 Units total) into the skin 2 (two) times daily. Per sliding scale CBG LESS THAN 70= EAT OR DRINK SWEET + RECHECK CBG 70-120=0 CBG 121-150=1 CBG 151-200=2 CBG 201-250=3 CBG 251-300=5 CBG 301-350=7 CBG 351-400=9 CBG GREATER THAN 400 CALL DOCTOR, Disp: 15 mL, Rfl: 1 .  Lancets MISC, Use as directed three times daily with meals, Disp: 100 each, Rfl: 3 .  metoprolol tartrate (LOPRESSOR) 100 MG tablet, TAKE 1 TABLET BY MOUTH TWICE A DAY, Disp: 180 tablet, Rfl: 3 .  nitroGLYCERIN (NITROSTAT) 0.4 MG SL tablet, Place 1 tablet (0.4 mg total) under the tongue every 5 (five) minutes as needed for chest pain. Reported on 04/06/2016, Disp: 25 tablet, Rfl: 5 .  omeprazole (PRILOSEC) 20 MG capsule, TAKE 1 CAPSULE (20 MG TOTAL) BY MOUTH DAILY., Disp: 90 capsule, Rfl: 1 .  polyethylene glycol powder (GLYCOLAX/MIRALAX) powder, Take 17 gm by mouth daily, Disp: 3350 g, Rfl: 11 .  potassium chloride SA (K-DUR,KLOR-CON) 20 MEQ tablet, Take 20 mEq by mouth daily., Disp: , Rfl:  .  risedronate (ACTONEL) 150 MG tablet, TAKE 1 TABLET BY MOUTH ONCE A MONTH-TAKE ON THE SAME DAY OF EACH MONTH., Disp: 1 tablet, Rfl: 5 .  Tetrahydrozoline HCl (VISINE OP), Place 2 drops into both eyes daily as needed (DRY EYE)., Disp: , Rfl:  .  thiamine (VITAMIN B-1) 50 MG tablet, Take 1 tablet (50 mg total) by mouth daily., Disp: 90 tablet, Rfl: 1 .  timolol (BETIMOL) 0.5 % ophthalmic solution, Place 1 drop into both eyes daily. , Disp: , Rfl:  .  timolol (TIMOPTIC) 0.5 % ophthalmic solution, PLACE 1 DROP IN BOTH EYES DAILY, Disp: , Rfl: 3 .  tiZANidine (ZANAFLEX) 4 MG tablet, TAKE 1 TABLET (4 MG TOTAL) BY MOUTH EVERY 6 (SIX) HOURS AS NEEDED FOR MUSCLE SPASMS., Disp: 60 tablet, Rfl: 1 .  traMADol (ULTRAM) 50 MG tablet, Take 1 tablet (50 mg total) by mouth every 6 (six) hours as needed., Disp: 40 tablet, Rfl: 1 .  zolpidem  (AMBIEN) 5 MG tablet, Take 5 mg by mouth at bedtime as needed for sleep., Disp: , Rfl:   No Known Allergies  Social History   Socioeconomic History  . Marital status: Single    Spouse name: Not on file  . Number of children: 3  . Years of education: Not on file  . Highest education level: Not on file  Social Needs  .  Financial resource strain: Not on file  . Food insecurity - worry: Not on file  . Food insecurity - inability: Not on file  . Transportation needs - medical: Not on file  . Transportation needs - non-medical: Not on file  Occupational History  . Occupation: homemaker  Tobacco Use  . Smoking status: Former Smoker    Last attempt to quit: 10/25/2007    Years since quitting: 10.1  . Smokeless tobacco: Never Used  . Tobacco comment: Stopped 1.5 yrs ago January 2009  Substance and Sexual Activity  . Alcohol use: No  . Drug use: No  . Sexual activity: No  Other Topics Concern  . Not on file  Social History Narrative  . Not on file        Objective:  Physical Exam  General: AAO x3, NAD  Dermatological: Nails are hypertrophic, dystrophic, brittle, discolored, elongated 10. No surrounding redness or drainage. Tenderness nails 1-5 bilaterally. No open lesions or pre-ulcerative lesions are identified today.  Vascular: Dorsalis Pedis artery and Posterior Tibial artery pedal pulses are decrease bilateral with immedate capillary fill time. Pedal hair growth present.  Mild bilateral ankle edema present.  There is no pain with calf compression, swelling, warmth, erythema.   Neruologic: Sensation somewhat decreased with Derrel Nip monofilament.  Musculoskeletal: There is tenderness palpation of the left fourth toe.  There is no significant edema there is no erythema or increase in warmth.  There is no pain in the metatarsals or other areas of the foot.  Muscular strength 5/5 in all groups tested bilateral.  Gait: Unassisted, Nonantalgic.      Assessment:    72 year old female with symptomatic onychomycosis, decreased pulses, likely left fourth toe fracture     Plan:  -Treatment options discussed including all alternatives, risks, and complications -Etiology of symptoms were discussed -X-rays were obtained and reviewed.  Questionable radiolucency in the proximal phalanx of the left fourth toe concerning for fracture. -In regard to the toe pain we discussed buddy splinting.  She has minimal pain to the area there is no swelling.  We discussed the surgical shoe but wishes to hold off on that which can do buddy splinting for now.  Discussed that this can take several weeks for this to heal completely.  Toes in good position. -Nails sharp debrided x10 without any complications or bleeding -I did an ABI in the office which was abnormal.  Because this order arterial duplex as well as consultation.  She does have some claudication symptoms but no ulceration.  Trula Slade DPM

## 2017-12-25 ENCOUNTER — Telehealth: Payer: Self-pay | Admitting: *Deleted

## 2017-12-25 ENCOUNTER — Telehealth: Payer: Self-pay | Admitting: Internal Medicine

## 2017-12-25 DIAGNOSIS — R0989 Other specified symptoms and signs involving the circulatory and respiratory systems: Secondary | ICD-10-CM

## 2017-12-25 NOTE — Telephone Encounter (Signed)
Done

## 2017-12-25 NOTE — Telephone Encounter (Signed)
-----   Message from Trula Slade, DPM sent at 12/22/2017  4:37 PM EST ----- She had an ABI in the office that is abnormal- can you please put in for a consult and arterial duplex? Thanks.

## 2017-12-25 NOTE — Telephone Encounter (Signed)
Left message informing [name] - [facility] of referral to the [facility] and that I would fax to their main line.

## 2017-12-25 NOTE — Telephone Encounter (Signed)
Copied from [place]. Topic: Quick Communication - See Telephone Encounter >> Dec 25, 2017 10:16 AM Ahmed Prima L wrote: CRM for notification. See Telephone encounter for:   12/25/17.  Patient's daughter Forensic psychologist) is requesting for her to have a script for a wheelchair. Please advise. [phone]

## 2017-12-26 ENCOUNTER — Encounter: Payer: Self-pay | Admitting: Vascular Surgery

## 2017-12-26 ENCOUNTER — Other Ambulatory Visit: Payer: Self-pay

## 2017-12-26 ENCOUNTER — Ambulatory Visit (HOSPITAL_COMMUNITY)
Admission: RE | Admit: 2017-12-26 | Discharge: 2017-12-26 | Disposition: A | Discharge status: Home / Self Care | Payer: Medicare Other | Source: Ambulatory Visit | Attending: Vascular Surgery | Admitting: Vascular Surgery

## 2017-12-26 ENCOUNTER — Ambulatory Visit: Payer: Medicare Other | Admitting: Vascular Surgery

## 2017-12-26 ENCOUNTER — Ambulatory Visit (INDEPENDENT_AMBULATORY_CARE_PROVIDER_SITE_OTHER)
Admission: RE | Admit: 2017-12-26 | Discharge: 2017-12-26 | Disposition: A | Discharge status: Home / Self Care | Payer: Medicare Other | Source: Ambulatory Visit | Attending: Vascular Surgery | Admitting: Vascular Surgery

## 2017-12-26 VITALS — BP 194/87 | HR 77 | Temp 97.2°F | Resp 20 | Ht 61.0 in | Wt 199.0 lb

## 2017-12-26 DIAGNOSIS — M25562 Pain in left knee: Secondary | ICD-10-CM

## 2017-12-26 DIAGNOSIS — E785 Hyperlipidemia, unspecified: Secondary | ICD-10-CM | POA: Diagnosis not present

## 2017-12-26 DIAGNOSIS — M25561 Pain in right knee: Secondary | ICD-10-CM | POA: Diagnosis not present

## 2017-12-26 DIAGNOSIS — R6889 Other general symptoms and signs: Secondary | ICD-10-CM | POA: Diagnosis not present

## 2017-12-26 DIAGNOSIS — I10 Essential (primary) hypertension: Secondary | ICD-10-CM | POA: Insufficient documentation

## 2017-12-26 DIAGNOSIS — Z87891 Personal history of nicotine dependence: Secondary | ICD-10-CM | POA: Insufficient documentation

## 2017-12-26 DIAGNOSIS — I70203 Unspecified atherosclerosis of native arteries of extremities, bilateral legs: Secondary | ICD-10-CM | POA: Diagnosis not present

## 2017-12-26 NOTE — Progress Notes (Signed)
Vascular and Vein Specialist of [facility]  Patient name: [name] MRN: [id number] DOB: [birth year] Sex: female  REASON FOR CONSULT: Evaluation of lower extremity arterial insufficiency  HPI: [name] is a 72 y.o. female, who is today for evaluation of lower extremity arterial insufficiency.  She was found to have pain in both lower extremities and underwent an outpatient screening ankle arm index was abnormal and she is seeing [place] today for further discussion.  She has multiple apical issues and is minimally ambulatory.  She is in a wheelchair today.  She is able to stand to transfer.  She does have a history of renal insufficiency and underwent fistula creation by myself in 2014.  The fistula remains small and she was not usable.  Fortunately she is not gone on to end-stage renal disease.  The last creatinine in the [facility] system was less than 3.  She does not report true rest pain type symptoms.  She does have diffuse discomfort in both lower extremities.  Past Medical History:  Diagnosis Date  . Anemia, iron deficiency   . Aortic atherosclerosis ([place]) 11/17/2017  . Arthritis   . CAD (coronary artery disease)   . Carotid stenosis    Carotid US (02/2014):  Bilateral ICA 40-59%; > 50% L ECA; F/u 1 year  . Cataract    removed both eyes  . Cholelithiasis 11/17/2017  . Chronic kidney disease    chronic renal failure  . DM2 (diabetes mellitus, type 2) ([place])   . GERD (gastroesophageal reflux disease)   . Glaucoma   . HLD (hyperlipidemia)   . HTN (hypertension)   . Hx of cardiovascular stress test    Lexiscan Myoview (03/24/14):  No ischemia, EF 73%, Normal  . Hypoglycemia 06/27/2017  . Mitral regurgitation   . Osteopenia 07/21/2017  . Osteoporosis   . PUD (peptic ulcer disease)   . PVD (peripheral vascular disease) ([place])   . Shortness of breath    with exertion    Family History  Problem Relation Age of Onset  . Heart disease Father     . Glaucoma Father   . Diabetes Sister   . Diabetes Son   . Colon cancer Neg Hx   . Colon polyps Neg Hx   . Esophageal cancer Neg Hx   . Rectal cancer Neg Hx   . Stomach cancer Neg Hx     SOCIAL HISTORY: Social History   Socioeconomic History  . Marital status: Single    Spouse name: Not on file  . Number of children: 3  . Years of education: Not on file  . Highest education level: Not on file  Social Needs  . Financial resource strain: Not on file  . Food insecurity - worry: Not on file  . Food insecurity - inability: Not on file  . Transportation needs - medical: Not on file  . Transportation needs - non-medical: Not on file  Occupational History  . Occupation: homemaker  Tobacco Use  . Smoking status: Former Smoker    Last attempt to quit: 10/25/2007    Years since quitting: 10.1  . Smokeless tobacco: Never Used  . Tobacco comment: Stopped 1.5 yrs ago January 2009  Substance and Sexual Activity  . Alcohol use: No  . Drug use: No  . Sexual activity: No  Other Topics Concern  . Not on file  Social History Narrative  . Not on file    No Known Allergies  Current Outpatient Medications  Medication  Sig Dispense Refill  . acetaminophen (TYLENOL) 500 MG tablet Take 1-2 tablets (500-1,000 mg total) by mouth every 8 (eight) hours as needed for mild pain.    Marland Kitchen allopurinol (ZYLOPRIM) 100 MG tablet Take 100 mg by mouth daily.    Marland Kitchen amLODipine (NORVASC) 10 MG tablet TAKE ONE (1) TABLET EACH DAY 90 tablet 1  . aspirin 81 MG tablet Take 81 mg by mouth daily.    Marland Kitchen atorvastatin (LIPITOR) 80 MG tablet Take 1 tablet (80 mg total) by mouth daily at 6 PM. 90 tablet 3  . BD PEN NEEDLE NANO U/F 32G X 4 MM MISC USE DAILY WITH LANTUS SOLASTAR 100 each 11  . blood glucose meter kit and supplies KIT Dispense based on patient and insurance preference. Use up to three times daily as directed. (FOR ICD-9 250.00, 250.01). 1 each 0  . Blood Glucose Monitoring Suppl (ACCU-CHEK AVIVA PLUS)  w/Device KIT USE AS DIRECTED ONCE DAILY 1 kit 0  . calcitRIOL (ROCALTROL) 0.25 MCG capsule Take 0.25 mcg by mouth daily.    . clopidogrel (PLAVIX) 75 MG tablet Take 75 mg by mouth daily.    . Cyanocobalamin (B-12 PO) Take 1 tablet by mouth daily.    . diclofenac sodium (VOLTAREN) 1 % GEL Apply 4 g topically 4 (four) times daily as needed. 400 g 11  . ferrous sulfate 325 (65 FE) MG tablet TAKE 1 TABLET (325 MG TOTAL) BY MOUTH 2 (TWO) TIMES DAILY. 180 tablet 1  . ferrous sulfate 325 (65 FE) MG tablet TAKE 1 TABLET (325 MG TOTAL) BY MOUTH 2 (TWO) TIMES DAILY. 60 tablet 5  . furosemide (LASIX) 80 MG tablet Take 0.5 tablets (40 mg total) by mouth 2 (two) times daily.    Marland Kitchen glucose blood test strip ACCU CHECK - Use as instructed three time daily  E11.9 300 each 12  . HUMALOG KWIKPEN 100 UNIT/ML KiwkPen Inject 0-0.09 mLs (0-9 Units total) into the skin 2 (two) times daily. Per sliding scale CBG LESS THAN 70= EAT OR DRINK SWEET + RECHECK CBG 70-120=0 CBG 121-150=1 CBG 151-200=2 CBG 201-250=3 CBG 251-300=5 CBG 301-350=7 CBG 351-400=9 CBG GREATER THAN 400 CALL DOCTOR 15 mL 1  . Lancets MISC Use as directed three times daily with meals 100 each 3  . metoprolol tartrate (LOPRESSOR) 100 MG tablet TAKE 1 TABLET BY MOUTH TWICE A DAY 180 tablet 3  . nitroGLYCERIN (NITROSTAT) 0.4 MG SL tablet Place 1 tablet (0.4 mg total) under the tongue every 5 (five) minutes as needed for chest pain. Reported on 04/06/2016 25 tablet 5  . omeprazole (PRILOSEC) 20 MG capsule TAKE 1 CAPSULE (20 MG TOTAL) BY MOUTH DAILY. 90 capsule 1  . polyethylene glycol powder (GLYCOLAX/MIRALAX) powder Take 17 gm by mouth daily 3350 g 11  . potassium chloride SA (K-DUR,KLOR-CON) 20 MEQ tablet Take 20 mEq by mouth daily.    . risedronate (ACTONEL) 150 MG tablet TAKE 1 TABLET BY MOUTH ONCE A MONTH-TAKE ON THE SAME DAY OF EACH MONTH. 1 tablet 5  . Tetrahydrozoline HCl (VISINE OP) Place 2 drops into both eyes daily as needed (DRY EYE).    Marland Kitchen thiamine  (VITAMIN B-1) 50 MG tablet Take 1 tablet (50 mg total) by mouth daily. 90 tablet 1  . timolol (BETIMOL) 0.5 % ophthalmic solution Place 1 drop into both eyes daily.     . timolol (TIMOPTIC) 0.5 % ophthalmic solution PLACE 1 DROP IN BOTH EYES DAILY  3  . tiZANidine (ZANAFLEX) 4 MG  tablet TAKE 1 TABLET (4 MG TOTAL) BY MOUTH EVERY 6 (SIX) HOURS AS NEEDED FOR MUSCLE SPASMS. 60 tablet 1  . traMADol (ULTRAM) 50 MG tablet Take 1 tablet (50 mg total) by mouth every 6 (six) hours as needed. 40 tablet 1  . zolpidem (AMBIEN) 5 MG tablet Take 5 mg by mouth at bedtime as needed for sleep.     No current facility-administered medications for this visit.     REVIEW OF SYSTEMS:  '[X]'$  denotes positive finding, '[ ]'$  denotes negative finding Cardiac  Comments:  Chest pain or chest pressure:    Shortness of breath upon exertion: x   Short of breath when lying flat: x   Irregular heart rhythm:        Vascular    Pain in calf, thigh, or hip brought on by ambulation:    Pain in feet at night that wakes you up from your sleep:  x   Blood clot in your veins: x   Leg swelling:  x       Pulmonary    Oxygen at home:    Productive cough:     Wheezing:         Neurologic    Sudden weakness in arms or legs:  x   Sudden numbness in arms or legs:  x   Sudden onset of difficulty speaking or slurred speech:    Temporary loss of vision in one eye:     Problems with dizziness:         Gastrointestinal    Blood in stool:     Vomited blood:         Genitourinary    Burning when urinating:     Blood in urine:        Psychiatric    Major depression:         Hematologic    Bleeding problems:    Problems with blood clotting too easily:        Skin    Rashes or ulcers:        Constitutional    Fever or chills:      PHYSICAL EXAM: Vitals:   12/26/17 0932  BP: (!) 194/87  Pulse: 77  Resp: 20  Temp: (!) 97.2 F (36.2 C)  TempSrc: Oral  SpO2: 99%  Weight: 199 lb (90.3 kg)  Height: '5\' 1"'$  (1.549 m)      GENERAL: The patient is a well-nourished female, in no acute distress. The vital signs are documented above. CARDIOVASCULAR: Affable radial pulses bilaterally.  I do not feel a thrill in her fistula absent pedal pulses.  She does have strong Doppler signals bilaterally better in the right and on dorsalis pedis and posterior tibial PULMONARY: There is good air exchange  ABDOMEN: Soft and non-tender  MUSCULOSKELETAL: There are no major deformities or cyanosis. NEUROLOGIC: No focal weakness or paresthesias are detected. SKIN: There are no ulcers or rashes noted. PSYCHIATRIC: The patient has a normal affect.  DATA:  He underwent noninvasive studies in our office today.  This showed ankle arm index 0.81 on the right and 0.56 on the left.  Toe brachial index is 0.44 on the right and 0.39 on the left.  Doppler suggests affable aortoiliac stenosis and potentially left superficial femoral artery occlusion  MEDICAL ISSUES: Had long discussion with the patient and her family present.  I do not feel that she has limb threatening ischemia.  I am not convinced that her symptoms are related to arterial  insufficiency.  I would recommend observation only not having any tissue loss.  Risk for renal function worsening with arteriography.  He was comfortable with this discussion will follow up with [place] again on an as-needed basis.  She will continue follow-up with Dr. Earleen Newport for foot care   Rosetta Posner, MD [facility] Vascular and Vein Specialists of [facility] Tel [phone] Pager [phone]

## 2017-12-26 NOTE — Telephone Encounter (Signed)
Done hardcopy to Shirron  

## 2017-12-26 NOTE — Telephone Encounter (Signed)
Daughter has been informed  Script at front desk

## 2018-01-01 DIAGNOSIS — M1A9XX1 Chronic gout, unspecified, with tophus (tophi): Secondary | ICD-10-CM | POA: Diagnosis not present

## 2018-01-01 DIAGNOSIS — M1A09X0 Idiopathic chronic gout, multiple sites, without tophus (tophi): Secondary | ICD-10-CM | POA: Diagnosis not present

## 2018-01-01 DIAGNOSIS — M79671 Pain in right foot: Secondary | ICD-10-CM | POA: Diagnosis not present

## 2018-01-01 DIAGNOSIS — Z79899 Other long term (current) drug therapy: Secondary | ICD-10-CM | POA: Diagnosis not present

## 2018-01-01 DIAGNOSIS — N183 Chronic kidney disease, stage 3 (moderate): Secondary | ICD-10-CM | POA: Diagnosis not present

## 2018-01-05 ENCOUNTER — Encounter (HOSPITAL_COMMUNITY)
Admission: RE | Admit: 2018-01-05 | Discharge: 2018-01-05 | Disposition: A | Discharge status: Home / Self Care | Payer: Medicare Other | Source: Ambulatory Visit | Attending: Nephrology | Admitting: Nephrology

## 2018-01-05 VITALS — BP 114/65 | HR 72 | Temp 98.1°F | Resp 20

## 2018-01-05 DIAGNOSIS — D631 Anemia in chronic kidney disease: Secondary | ICD-10-CM | POA: Diagnosis not present

## 2018-01-05 DIAGNOSIS — D539 Nutritional anemia, unspecified: Secondary | ICD-10-CM

## 2018-01-05 DIAGNOSIS — N184 Chronic kidney disease, stage 4 (severe): Secondary | ICD-10-CM | POA: Diagnosis not present

## 2018-01-05 LAB — POCT HEMOGLOBIN-HEMACUE: Hemoglobin: 11.3 g/dL — ABNORMAL LOW (ref 12.0–15.0)

## 2018-01-05 MED ORDER — EPOETIN ALFA 10000 UNIT/ML IJ SOLN
20000.0000 [IU] | INTRAMUSCULAR | Status: DC
  Administered 2018-01-05: 13:00:00 20000 [IU] via SUBCUTANEOUS

## 2018-01-05 MED ORDER — EPOETIN ALFA 20000 UNIT/ML IJ SOLN
INTRAMUSCULAR | Status: AC
  Filled 2018-01-05: qty 1

## 2018-01-08 MED FILL — Epoetin Alfa Inj 20000 Unit/ML: INTRAMUSCULAR | Qty: 1 | Status: AC

## 2018-01-19 ENCOUNTER — Encounter (HOSPITAL_COMMUNITY): Payer: Medicare Other

## 2018-01-23 ENCOUNTER — Other Ambulatory Visit: Payer: Self-pay

## 2018-01-23 MED ORDER — CLOPIDOGREL BISULFATE 75 MG PO TABS: 75.0000 mg | ORAL_TABLET | Freq: Every day | ORAL | 1 refills | Status: DC

## 2018-01-26 ENCOUNTER — Encounter (HOSPITAL_COMMUNITY): Payer: Medicare Other

## 2018-02-07 DIAGNOSIS — D631 Anemia in chronic kidney disease: Secondary | ICD-10-CM | POA: Diagnosis not present

## 2018-02-07 DIAGNOSIS — N2581 Secondary hyperparathyroidism of renal origin: Secondary | ICD-10-CM | POA: Diagnosis not present

## 2018-02-07 DIAGNOSIS — N184 Chronic kidney disease, stage 4 (severe): Secondary | ICD-10-CM | POA: Diagnosis not present

## 2018-02-07 DIAGNOSIS — E1122 Type 2 diabetes mellitus with diabetic chronic kidney disease: Secondary | ICD-10-CM | POA: Diagnosis not present

## 2018-02-07 DIAGNOSIS — I129 Hypertensive chronic kidney disease with stage 1 through stage 4 chronic kidney disease, or unspecified chronic kidney disease: Secondary | ICD-10-CM | POA: Diagnosis not present

## 2018-02-09 ENCOUNTER — Encounter (HOSPITAL_COMMUNITY): Payer: Medicare Other

## 2018-02-14 ENCOUNTER — Telehealth: Payer: Self-pay | Admitting: Internal Medicine

## 2018-02-14 MED ORDER — ZOLPIDEM TARTRATE 5 MG PO TABS: 5.0000 mg | ORAL_TABLET | Freq: Every evening | ORAL | 5 refills | Status: DC | PRN

## 2018-02-14 NOTE — Telephone Encounter (Signed)
Copied from [place] [id number]. Topic: Quick Communication - See Telephone Encounter >> Feb 14, 2018 12:37 PM Oneta Rack wrote:  Caller name: Isley,Lucy Relation to pt: daughter  Call back number: [phone]  Pharmacy: [id number] - [place], [place]  Reason for call:  Daughter requesting zolpidem (AMBIEN) 5 MG tablet due to patient expressing difficulty sleeping, please advise

## 2018-02-14 NOTE — Telephone Encounter (Signed)
Done erx 

## 2018-02-16 ENCOUNTER — Encounter (HOSPITAL_COMMUNITY)
Admission: RE | Admit: 2018-02-16 | Discharge: 2018-02-16 | Disposition: A | Discharge status: Home / Self Care | Payer: Medicare Other | Source: Ambulatory Visit | Attending: Nephrology | Admitting: Nephrology

## 2018-02-16 VITALS — BP 156/72 | HR 73 | Temp 98.1°F | Resp 20

## 2018-02-16 DIAGNOSIS — D539 Nutritional anemia, unspecified: Secondary | ICD-10-CM

## 2018-02-16 DIAGNOSIS — N184 Chronic kidney disease, stage 4 (severe): Secondary | ICD-10-CM | POA: Insufficient documentation

## 2018-02-16 DIAGNOSIS — D631 Anemia in chronic kidney disease: Secondary | ICD-10-CM | POA: Diagnosis not present

## 2018-02-16 LAB — POCT HEMOGLOBIN-HEMACUE: Hemoglobin: 12.3 g/dL (ref 12.0–15.0)

## 2018-02-16 MED ORDER — EPOETIN ALFA 10000 UNIT/ML IJ SOLN: 20000.0000 [IU] | INTRAMUSCULAR | Status: DC

## 2018-02-19 ENCOUNTER — Other Ambulatory Visit: Payer: Self-pay

## 2018-02-19 MED ORDER — HUMALOG KWIKPEN 100 UNIT/ML ~~LOC~~ SOPN: 0.0000 [IU] | PEN_INJECTOR | Freq: Two times a day (BID) | SUBCUTANEOUS | 1 refills | Status: DC

## 2018-02-20 ENCOUNTER — Other Ambulatory Visit: Payer: Self-pay | Admitting: Internal Medicine

## 2018-03-01 ENCOUNTER — Other Ambulatory Visit (HOSPITAL_COMMUNITY): Payer: Self-pay | Admitting: *Deleted

## 2018-03-02 ENCOUNTER — Ambulatory Visit (HOSPITAL_COMMUNITY)
Admission: RE | Admit: 2018-03-02 | Discharge: 2018-03-02 | Disposition: A | Discharge status: Home / Self Care | Payer: Medicare Other | Source: Ambulatory Visit | Attending: Nephrology | Admitting: Nephrology

## 2018-03-02 ENCOUNTER — Other Ambulatory Visit: Payer: Self-pay | Admitting: Internal Medicine

## 2018-03-02 VITALS — BP 155/77 | HR 66 | Temp 97.6°F | Resp 20

## 2018-03-02 DIAGNOSIS — N184 Chronic kidney disease, stage 4 (severe): Secondary | ICD-10-CM | POA: Diagnosis not present

## 2018-03-02 DIAGNOSIS — D631 Anemia in chronic kidney disease: Secondary | ICD-10-CM | POA: Insufficient documentation

## 2018-03-02 DIAGNOSIS — D539 Nutritional anemia, unspecified: Secondary | ICD-10-CM

## 2018-03-02 LAB — POCT HEMOGLOBIN-HEMACUE: HEMOGLOBIN: 12.8 g/dL (ref 12.0–15.0)

## 2018-03-02 LAB — IRON AND TIBC
Iron: 68 ug/dL (ref 28–170)
Saturation Ratios: 24 % (ref 10.4–31.8)
TIBC: 283 ug/dL (ref 250–450)
UIBC: 215 ug/dL

## 2018-03-02 LAB — FERRITIN: Ferritin: 686 ng/mL — ABNORMAL HIGH (ref 11–307)

## 2018-03-02 MED ORDER — EPOETIN ALFA 10000 UNIT/ML IJ SOLN: 20000.0000 [IU] | INTRAMUSCULAR | Status: DC

## 2018-03-02 MED ORDER — EPOETIN ALFA 20000 UNIT/ML IJ SOLN
INTRAMUSCULAR | Status: AC
  Filled 2018-03-02: qty 1

## 2018-03-02 NOTE — Telephone Encounter (Signed)
Done erx 

## 2018-03-03 ENCOUNTER — Other Ambulatory Visit: Payer: Self-pay | Admitting: Internal Medicine

## 2018-03-16 ENCOUNTER — Encounter (HOSPITAL_COMMUNITY): Payer: Medicare Other

## 2018-03-20 ENCOUNTER — Other Ambulatory Visit: Payer: Self-pay | Admitting: Internal Medicine

## 2018-03-20 NOTE — Telephone Encounter (Addendum)
Daughter calling to follow up on this order/request for a wheelchair. [facility] told daughter they never received any info they requested, so they cancelled the order.  Now they are requesting a new order, along with written Rx, and a narrative.  Daughter requesting all this. Pt does not have an appt with Dr [name] until august

## 2018-03-20 NOTE — Telephone Encounter (Signed)
Ok to call [facility] to see if she is an active patient, and let them know she needs a wheelchair, and for the form to be sent by them for [place] to fill out

## 2018-03-20 NOTE — Telephone Encounter (Signed)
I signed a wheelchair form and narrative recently for a patient, and I think it may have been for Ms Gorder  Can we check with [facility] to see if this was done, and if not we will need the form for documentation purposes (from [facility] or another home health if they are involved with a different Home Health agency)

## 2018-03-20 NOTE — Telephone Encounter (Signed)
I dont see any forms for a wheelchair that has been scanned into the chart's media tab and I check my fax folder and the orders I had were for other patients.

## 2018-03-21 NOTE — Telephone Encounter (Signed)
[facility] stated they will refax the forms over.

## 2018-03-22 ENCOUNTER — Encounter (HOSPITAL_COMMUNITY)
Admission: RE | Admit: 2018-03-22 | Discharge: 2018-03-22 | Disposition: A | Discharge status: Home / Self Care | Payer: Medicare Other | Source: Ambulatory Visit | Attending: Nephrology | Admitting: Nephrology

## 2018-03-22 VITALS — BP 155/65 | HR 70 | Temp 98.1°F | Resp 20

## 2018-03-22 DIAGNOSIS — D631 Anemia in chronic kidney disease: Secondary | ICD-10-CM | POA: Diagnosis not present

## 2018-03-22 DIAGNOSIS — D539 Nutritional anemia, unspecified: Secondary | ICD-10-CM

## 2018-03-22 DIAGNOSIS — N184 Chronic kidney disease, stage 4 (severe): Secondary | ICD-10-CM | POA: Diagnosis not present

## 2018-03-22 LAB — POCT HEMOGLOBIN-HEMACUE: Hemoglobin: 11.7 g/dL — ABNORMAL LOW (ref 12.0–15.0)

## 2018-03-22 MED ORDER — EPOETIN ALFA 20000 UNIT/ML IJ SOLN
INTRAMUSCULAR | Status: AC
  Filled 2018-03-22: qty 1

## 2018-03-22 MED ORDER — EPOETIN ALFA 10000 UNIT/ML IJ SOLN
20000.0000 [IU] | INTRAMUSCULAR | Status: DC
  Administered 2018-03-22: 20000 [IU] via SUBCUTANEOUS

## 2018-03-23 ENCOUNTER — Encounter (HOSPITAL_COMMUNITY): Payer: Medicare Other

## 2018-03-23 MED FILL — Epoetin Alfa Inj 20000 Unit/ML: INTRAMUSCULAR | Qty: 1 | Status: AC

## 2018-04-06 ENCOUNTER — Encounter (HOSPITAL_COMMUNITY): Payer: Medicare Other

## 2018-04-15 ENCOUNTER — Other Ambulatory Visit: Payer: Self-pay | Admitting: Internal Medicine

## 2018-04-17 DIAGNOSIS — M5432 Sciatica, left side: Secondary | ICD-10-CM | POA: Diagnosis not present

## 2018-04-17 DIAGNOSIS — N186 End stage renal disease: Secondary | ICD-10-CM | POA: Diagnosis not present

## 2018-04-17 DIAGNOSIS — G8929 Other chronic pain: Secondary | ICD-10-CM | POA: Diagnosis not present

## 2018-04-17 DIAGNOSIS — R2689 Other abnormalities of gait and mobility: Secondary | ICD-10-CM | POA: Diagnosis not present

## 2018-04-17 DIAGNOSIS — M25561 Pain in right knee: Secondary | ICD-10-CM | POA: Diagnosis not present

## 2018-04-20 ENCOUNTER — Encounter (HOSPITAL_COMMUNITY): Payer: Medicare Other

## 2018-04-27 ENCOUNTER — Ambulatory Visit (HOSPITAL_COMMUNITY)
Admission: RE | Admit: 2018-04-27 | Discharge: 2018-04-27 | Disposition: A | Discharge status: Home / Self Care | Payer: Medicare Other | Source: Ambulatory Visit | Attending: Nephrology | Admitting: Nephrology

## 2018-04-27 VITALS — BP 160/69 | HR 77 | Temp 98.3°F | Ht 61.0 in | Wt 198.0 lb

## 2018-04-27 DIAGNOSIS — N184 Chronic kidney disease, stage 4 (severe): Secondary | ICD-10-CM | POA: Diagnosis not present

## 2018-04-27 DIAGNOSIS — D631 Anemia in chronic kidney disease: Secondary | ICD-10-CM | POA: Diagnosis not present

## 2018-04-27 DIAGNOSIS — D539 Nutritional anemia, unspecified: Secondary | ICD-10-CM

## 2018-04-27 LAB — POCT HEMOGLOBIN-HEMACUE: HEMOGLOBIN: 11.2 g/dL — AB (ref 12.0–15.0)

## 2018-04-27 MED ORDER — EPOETIN ALFA 10000 UNIT/ML IJ SOLN: 20000.0000 [IU] | INTRAMUSCULAR | Status: DC

## 2018-04-27 MED ORDER — EPOETIN ALFA 20000 UNIT/ML IJ SOLN
INTRAMUSCULAR | Status: AC
  Administered 2018-04-27: 20000 [IU]
  Filled 2018-04-27: qty 1

## 2018-05-07 ENCOUNTER — Other Ambulatory Visit: Payer: Self-pay | Admitting: Internal Medicine

## 2018-05-07 DIAGNOSIS — D539 Nutritional anemia, unspecified: Secondary | ICD-10-CM

## 2018-05-16 ENCOUNTER — Other Ambulatory Visit: Payer: Self-pay | Admitting: Internal Medicine

## 2018-05-17 DIAGNOSIS — M25561 Pain in right knee: Secondary | ICD-10-CM | POA: Diagnosis not present

## 2018-05-17 DIAGNOSIS — R2689 Other abnormalities of gait and mobility: Secondary | ICD-10-CM | POA: Diagnosis not present

## 2018-05-23 DIAGNOSIS — N184 Chronic kidney disease, stage 4 (severe): Secondary | ICD-10-CM | POA: Diagnosis not present

## 2018-05-23 DIAGNOSIS — D631 Anemia in chronic kidney disease: Secondary | ICD-10-CM | POA: Diagnosis not present

## 2018-05-23 DIAGNOSIS — N2581 Secondary hyperparathyroidism of renal origin: Secondary | ICD-10-CM | POA: Diagnosis not present

## 2018-05-23 DIAGNOSIS — I129 Hypertensive chronic kidney disease with stage 1 through stage 4 chronic kidney disease, or unspecified chronic kidney disease: Secondary | ICD-10-CM | POA: Diagnosis not present

## 2018-05-23 DIAGNOSIS — N189 Chronic kidney disease, unspecified: Secondary | ICD-10-CM | POA: Diagnosis not present

## 2018-05-23 DIAGNOSIS — E1122 Type 2 diabetes mellitus with diabetic chronic kidney disease: Secondary | ICD-10-CM | POA: Diagnosis not present

## 2018-05-28 NOTE — Progress Notes (Addendum)
Subjective:   [name] is a 72 y.o. female who presents for Medicare Annual (Subsequent) preventive examination.  Review of Systems:  No ROS.  Medicare Wellness Visit. Additional risk factors are reflected in the social history.  Cardiac Risk Factors include: advanced age (>59men, >72 women);diabetes mellitus;dyslipidemia;hypertension;obesity (BMI >30kg/m2)  Sleep patterns: gets up 1-2 times nightly to void and sleeps 7-8 hours nightly.    Home Safety/Smoke Alarms: Feels safe in home. Smoke alarms in place.  Living environment; residence and Firearm Safety: 1-story house/ trailer, equipment: Radio producer, Type: Single [place] and Walkers, Type: Conservation officer, nature, no firearms. Lives with daughter, needs tub bench DME, good support system Seat Belt Safety/Bike Helmet: Wears seat belt.      Objective:     Vitals: BP (!) 146/82   Pulse 78   Resp 18   Ht 4\' 10"  (1.473 m)   Wt 212 lb (96.2 kg)   SpO2 98%   BMI 44.31 kg/m   Body mass index is 44.31 kg/m.  Advanced Directives 05/29/2018 12/26/2017 11/16/2017 11/16/2017 08/08/2017 06/27/2017 06/27/2017  Does Patient Have a Medical Advance Directive? No No No No (No Data) No No  Does patient want to make changes to medical advance directive? - - - - - - -  Would patient like information on creating a medical advance directive? No - Patient declined - No - Patient declined No - Patient declined - No - Patient declined -  Pre-existing out of facility DNR order (yellow form or pink MOST form) - - - - - - -    Tobacco Social History   Tobacco Use  Smoking Status Former Smoker  . Last attempt to quit: 10/25/2007  . Years since quitting: 10.6  Smokeless Tobacco Never Used  Tobacco Comment   Stopped 1.5 yrs ago January 2009     Counseling given: Not Answered Comment: Stopped 1.5 yrs ago January 2009  Past Medical History:  Diagnosis Date  . Anemia, iron deficiency   . Aortic atherosclerosis ([place]) 11/17/2017  . Arthritis   . CAD  (coronary artery disease)   . Carotid stenosis    Carotid US (02/2014):  Bilateral ICA 40-59%; > 50% L ECA; F/u 1 year  . Cataract    removed both eyes  . Cholelithiasis 11/17/2017  . Chronic kidney disease    chronic renal failure  . DM2 (diabetes mellitus, type 2) ([place])   . GERD (gastroesophageal reflux disease)   . Glaucoma   . HLD (hyperlipidemia)   . HTN (hypertension)   . Hx of cardiovascular stress test    Lexiscan Myoview (03/24/14):  No ischemia, EF 73%, Normal  . Hypoglycemia 06/27/2017  . Mitral regurgitation   . Osteopenia 07/21/2017  . Osteoporosis   . PUD (peptic ulcer disease)   . PVD (peripheral vascular disease) ([employer])   . Shortness of breath    with exertion   Past Surgical History:  Procedure Laterality Date  . ABDOMINAL HYSTERECTOMY    . APPENDECTOMY    . AV FISTULA PLACEMENT Right 01/18/2013   Procedure: ARTERIOVENOUS (AV) FISTULA CREATION;  Surgeon: Rosetta Posner, MD;  Location: [facility];  Service: Vascular;  Laterality: Right;  Ultrasound guided  . COLONOSCOPY    . CORONARY ANGIOPLASTY WITH STENT PLACEMENT    . RENAL ANGIOGRAM N/A 09/16/2011   Procedure: RENAL ANGIOGRAM;  Surgeon: Sherren Mocha, MD;  [facility] C[facility] LAB;  Service: Cardiovascular;  Laterality: N/A;   Family History  Problem Relation Age of Onset  .  Heart disease Father   . Glaucoma Father   . Diabetes Sister   . Diabetes Son   . Colon cancer Neg Hx   . Colon polyps Neg Hx   . Esophageal cancer Neg Hx   . Rectal cancer Neg Hx   . Stomach cancer Neg Hx    Social History   Socioeconomic History  . Marital status: Single    Spouse name: Not on file  . Number of children: 3  . Years of education: Not on file  . Highest education level: Not on file  Occupational History  . Occupation: homemaker  Social Needs  . Financial resource strain: Not hard at all  . Food insecurity:    Worry: Never true    Inability: Never true  . Transportation needs:    Medical: No    Non-medical: No    Tobacco Use  . Smoking status: Former Smoker    Last attempt to quit: 10/25/2007    Years since quitting: 10.6  . Smokeless tobacco: Never Used  . Tobacco comment: Stopped 1.5 yrs ago January 2009  Substance and Sexual Activity  . Alcohol use: No  . Drug use: No  . Sexual activity: Never  Lifestyle  . Physical activity:    Days per week: 0 days    Minutes per session: 0 min  . Stress: Not at all  Relationships  . Social connections:    Talks on phone: More than three times a week    Gets together: More than three times a week    Attends religious service: Not on file    Active member of club or organization: Not on file    Attends meetings of clubs or organizations: Not on file    Relationship status: Not on file  Other Topics Concern  . Not on file  Social History Narrative  . Not on file      Activities of Daily Living In your present state of health, do you have any difficulty performing the following activities: 05/29/2018 11/16/2017  Hearing? N N  Vision? N N  Difficulty concentrating or making decisions? N N  Walking or climbing stairs? Y Y  Dressing or bathing? Y N  Doing errands, shopping? Y N  Preparing Food and eating ? N -  Using the Toilet? N -  In the past six months, have you accidently leaked urine? N -  Do you have problems with loss of bowel control? N -  Managing your Medications? N -  Managing your Finances? N -  Housekeeping or managing your Housekeeping? Y -  Some recent data might be hidden    Patient Care Team: [name], MD as PCP - [name], MD as Consulting Physician (Nephrology) Trula Slade, DPM as Consulting Physician (Podiatry) Sherren Mocha, MD as Consulting Physician (Cardiology) Gatha Mayer, MD as Consulting Physician (Gastroenterology)    Assessment:   This is a routine wellness examination for [name]. Physical assessment deferred to PCP.   Exercise Activities and Dietary recommendations Current  Exercise Habits: The patient does not participate in regular exercise at present(chair exercises provided), Exercise limited by: orthopedic condition(s)  Diet (meal preparation, eat out, water intake, caffeinated beverages, dairy products, fruits and vegetables): in general, a "healthy" diet  , well balanced   Reviewed heart healthy and diabetic diet, Encouraged patient to increase daily water and healthy fluid intake.  Goals    . Patient Stated     Stay as active and  as independent as possible. I want to become more active socially by checking out [facility] or [facility].       Fall Risk Fall Risk  05/29/2018 12/13/2017 06/08/2017 12/20/2016 03/18/2015  Falls in the past year? Yes No No Yes No  Number falls in past yr: 1 - - 1 -  Injury with Fall? No - - No -  Risk for fall due to : Impaired balance/gait;Impaired mobility - - Impaired balance/gait;Impaired mobility;Impaired vision -  Follow up Falls prevention discussed - - Falls prevention discussed;Education provided -    Depression Screen PHQ 2/9 Scores 05/29/2018 12/13/2017 12/20/2016 03/18/2015  PHQ - 2 Score 0 0 1 0  PHQ- 9 Score 1 - - -     Cognitive Function       Ad8 score reviewed for issues:  Issues making decisions: no  Less interest in hobbies / activities: no  Repeats questions, stories (family complaining): no  Trouble using ordinary gadgets (microwave, computer, phone):no  Forgets the month or year: no  Mismanaging finances: no  Remembering appts: no  Daily problems with thinking and/or memory: no Ad8 score is= 0  Immunization History  Administered Date(s) Administered  . H1N1 12/18/2008  . Influenza Whole 09/30/2009, 10/07/2010  . Influenza, High Dose Seasonal PF 07/06/2017  . Influenza, Seasonal, Injecte, Preservative Fre 08/02/2013  . Influenza-Unspecified 07/25/2015, 08/25/2015  . Pneumococcal Conjugate-13 09/13/2013  . Pneumococcal Polysaccharide-23 06/05/2006, 05/11/2011  . Td 12/18/2008     Screening Tests Health Maintenance  Topic Date Due  . HEMOGLOBIN A1C  05/23/2018  . INFLUENZA VACCINE  05/24/2018  . FOOT EXAM  06/08/2018  . OPHTHALMOLOGY EXAM  11/20/2018  . URINE MICROALBUMIN  11/23/2018  . TETANUS/TDAP  12/18/2018  . MAMMOGRAM  01/26/2019  . COLONOSCOPY  07/22/2026  . DEXA SCAN  Completed  . Hepatitis C Screening  Completed  . PNA vac Low Risk Adult  Completed    Plan:    Tub bench ordered for patient's safety and to help maintain independence of ADLs.   Continue doing brain stimulating activities (puzzles, reading, adult coloring books, staying active) to keep memory sharp.   Continue to eat heart healthy diet (full of fruits, vegetables, whole grains, lean protein, water--limit salt, fat, and sugar intake) and increase physical activity as tolerated.   I have personally reviewed and noted the following in the patient's chart:   . Medical and social history . Use of alcohol, tobacco or illicit drugs  . Current medications and supplements . Functional ability and status . Nutritional status . Physical activity . Advanced directives . List of other physicians . Vitals . Screenings to include cognitive, depression, and falls . Referrals and appointments  In addition, I have reviewed and discussed with patient certain preventive protocols, quality metrics, and best practice recommendations. A written personalized care plan for preventive services as well as general preventive health recommendations were provided to patient.     Michiel Cowboy, RN  05/29/2018    Medical screening examination/treatment/procedure(s) were performed by non-physician practitioner and as supervising physician I was immediately available for consultation/collaboration. I agree with above. Cathlean Cower, MD

## 2018-05-29 ENCOUNTER — Ambulatory Visit (INDEPENDENT_AMBULATORY_CARE_PROVIDER_SITE_OTHER): Payer: Medicare Other | Admitting: *Deleted

## 2018-05-29 VITALS — BP 146/82 | HR 78 | Resp 18 | Ht <= 58 in | Wt 212.0 lb

## 2018-05-29 DIAGNOSIS — Z0000 Encounter for general adult medical examination without abnormal findings: Secondary | ICD-10-CM

## 2018-05-29 DIAGNOSIS — Z9181 History of falling: Secondary | ICD-10-CM | POA: Diagnosis not present

## 2018-05-29 NOTE — Patient Instructions (Addendum)
Continue doing brain stimulating activities (puzzles, reading, adult coloring books, staying active) to keep memory sharp.   Continue to eat heart healthy diet (full of fruits, vegetables, whole grains, lean protein, water--limit salt, fat, and sugar intake) and increase physical activity as tolerated.   [name] , Thank you for taking time to come for your Medicare Wellness Visit. I appreciate your ongoing commitment to your health goals. Please review the following plan we discussed and let me know if I can assist you in the future.   These are the goals we discussed: Goals    . Patient Stated     Stay as active and as independent as possible. I want to become more active socially by checking out [facility] or [facility].       This is a list of the screening recommended for you and due dates:  Health Maintenance  Topic Date Due  . Hemoglobin A1C  05/23/2018  . Flu Shot  05/24/2018  . Complete foot exam   06/08/2018  . Eye exam for diabetics  11/20/2018  . Urine Protein Check  11/23/2018  . Tetanus Vaccine  12/18/2018  . Mammogram  01/26/2019  . Colon Cancer Screening  07/22/2026  . DEXA scan (bone density measurement)  Completed  .  Hepatitis C: One time screening is recommended by Center for Disease Control  (CDC) for  adults born from [birth year] through 1967.   Completed  . Pneumonia vaccines  Completed

## 2018-05-31 ENCOUNTER — Ambulatory Visit (HOSPITAL_COMMUNITY)
Admission: RE | Admit: 2018-05-31 | Discharge: 2018-05-31 | Disposition: A | Discharge status: Home / Self Care | Payer: Medicare Other | Source: Ambulatory Visit | Attending: Nephrology | Admitting: Nephrology

## 2018-05-31 VITALS — BP 139/69 | HR 78 | Temp 98.3°F | Resp 15 | Ht 61.0 in | Wt 212.0 lb

## 2018-05-31 DIAGNOSIS — D631 Anemia in chronic kidney disease: Secondary | ICD-10-CM | POA: Insufficient documentation

## 2018-05-31 DIAGNOSIS — D539 Nutritional anemia, unspecified: Secondary | ICD-10-CM

## 2018-05-31 DIAGNOSIS — N184 Chronic kidney disease, stage 4 (severe): Secondary | ICD-10-CM | POA: Insufficient documentation

## 2018-05-31 LAB — IRON AND TIBC
IRON: 37 ug/dL (ref 28–170)
SATURATION RATIOS: 14 % (ref 10.4–31.8)
TIBC: 263 ug/dL (ref 250–450)
UIBC: 226 ug/dL

## 2018-05-31 LAB — FERRITIN: Ferritin: 627 ng/mL — ABNORMAL HIGH (ref 11–307)

## 2018-05-31 LAB — POCT HEMOGLOBIN-HEMACUE: Hemoglobin: 11.9 g/dL — ABNORMAL LOW (ref 12.0–15.0)

## 2018-05-31 MED ORDER — EPOETIN ALFA 10000 UNIT/ML IJ SOLN: 20000.0000 [IU] | INTRAMUSCULAR | Status: DC

## 2018-05-31 MED ORDER — EPOETIN ALFA 20000 UNIT/ML IJ SOLN
INTRAMUSCULAR | Status: AC
  Administered 2018-05-31: 20000 [IU] via SUBCUTANEOUS
  Filled 2018-05-31: qty 1

## 2018-06-01 ENCOUNTER — Encounter: Payer: Self-pay | Admitting: Podiatry

## 2018-06-01 ENCOUNTER — Ambulatory Visit: Payer: Medicare Other | Admitting: Podiatry

## 2018-06-01 DIAGNOSIS — M79674 Pain in right toe(s): Secondary | ICD-10-CM

## 2018-06-01 DIAGNOSIS — M79675 Pain in left toe(s): Secondary | ICD-10-CM

## 2018-06-01 DIAGNOSIS — I739 Peripheral vascular disease, unspecified: Secondary | ICD-10-CM | POA: Diagnosis not present

## 2018-06-01 DIAGNOSIS — B351 Tinea unguium: Secondary | ICD-10-CM | POA: Diagnosis not present

## 2018-06-04 ENCOUNTER — Other Ambulatory Visit: Payer: Self-pay | Admitting: Internal Medicine

## 2018-06-04 NOTE — Progress Notes (Signed)
Subjective: [name] presents today for routine footcare. Last podiatry appt was 12/22/17, where an abnormal ABI was revealed. She does have some claudication symptoms, but no wounds. She complains of painful, thick, elongated toenails. Pain is aggravated when wearing enclosed shoe gear. Pain is getting progressively worse and relieved with periodic professional debridement.   Objective: Vascular Examination: Capillary refill time immediate x 10 Dorsalis pedis pulses and posterior tibial pulses diminished b/l No digital hair x 10 digits Ankle edema BLE  Dermatological Examination: Skin thin, shiny and atrophic b/l Toenails 1-5 b/l discolored, thick, dystrophic with subungual debris and pain with palpation to nailbeds due to thickness of nails.  Musculoskeletal: Muscle strength 5/5 to all LE muscle groups Left 4th toe with no tenderness to palpation.  Neurological: Sensation diminished with 10 gram monofilament b/l   Assessment: 1. Painful onychomycosis toenails 1-5 b/l 2. Peripheral arterial disease with claudication  Plan: 1. Awaiting formal ABI studies from Vascular consultation. 2. Toenails 1-5 b/l were debrided in length and girth without iatrogenic bleeding. 3. Patient to continue soft, supportive shoe gear 4. Patient to report any pedal injuries to medical professional  5. Follow up 3 months.  6. Patient/POA to call should there be a concern in the interim.

## 2018-06-13 ENCOUNTER — Ambulatory Visit: Payer: Medicare Other | Admitting: Internal Medicine

## 2018-06-13 DIAGNOSIS — Z0289 Encounter for other administrative examinations: Secondary | ICD-10-CM

## 2018-06-14 ENCOUNTER — Other Ambulatory Visit (INDEPENDENT_AMBULATORY_CARE_PROVIDER_SITE_OTHER): Payer: Medicare Other

## 2018-06-14 ENCOUNTER — Other Ambulatory Visit: Payer: Self-pay | Admitting: Internal Medicine

## 2018-06-14 ENCOUNTER — Ambulatory Visit (INDEPENDENT_AMBULATORY_CARE_PROVIDER_SITE_OTHER): Payer: Medicare Other | Admitting: Internal Medicine

## 2018-06-14 VITALS — BP 142/84 | HR 78 | Temp 97.7°F | Ht 61.0 in | Wt 210.2 lb

## 2018-06-14 DIAGNOSIS — E118 Type 2 diabetes mellitus with unspecified complications: Secondary | ICD-10-CM | POA: Diagnosis not present

## 2018-06-14 DIAGNOSIS — E785 Hyperlipidemia, unspecified: Secondary | ICD-10-CM

## 2018-06-14 DIAGNOSIS — M25532 Pain in left wrist: Secondary | ICD-10-CM | POA: Diagnosis not present

## 2018-06-14 DIAGNOSIS — I10 Essential (primary) hypertension: Secondary | ICD-10-CM | POA: Diagnosis not present

## 2018-06-14 DIAGNOSIS — L03115 Cellulitis of right lower limb: Secondary | ICD-10-CM | POA: Diagnosis not present

## 2018-06-14 LAB — LIPID PANEL
Cholesterol: 150 mg/dL (ref 0–200)
HDL: 48.3 mg/dL (ref >39.00)
LDL Cholesterol: 81 mg/dL (ref 0–99)
NONHDL: 101.9
Total CHOL/HDL Ratio: 3
Triglycerides: 103 mg/dL (ref 0.0–149.0)
VLDL: 20.6 mg/dL (ref 0.0–40.0)

## 2018-06-14 LAB — HEPATIC FUNCTION PANEL
ALK PHOS: 116 U/L (ref 39–117)
ALT: 15 U/L (ref 0–35)
AST: 11 U/L (ref 0–37)
Albumin: 4 g/dL (ref 3.5–5.2)
BILIRUBIN TOTAL: 0.5 mg/dL (ref 0.2–1.2)
Bilirubin, Direct: 0.1 mg/dL (ref 0.0–0.3)
Total Protein: 7.7 g/dL (ref 6.0–8.3)

## 2018-06-14 LAB — BASIC METABOLIC PANEL
BUN: 61 mg/dL — AB (ref 6–23)
CALCIUM: 9.5 mg/dL (ref 8.4–10.5)
CO2: 23 meq/L (ref 19–32)
CREATININE: 2.25 mg/dL — AB (ref 0.40–1.20)
Chloride: 106 mEq/L (ref 96–112)
GFR: 27.34 mL/min — ABNORMAL LOW (ref >60.00)
GLUCOSE: 178 mg/dL — AB (ref 70–99)
Potassium: 3.7 mEq/L (ref 3.5–5.1)
Sodium: 139 mEq/L (ref 135–145)

## 2018-06-14 LAB — HEMOGLOBIN A1C: HEMOGLOBIN A1C: 7.9 % — AB (ref 4.6–6.5)

## 2018-06-14 MED ORDER — CEPHALEXIN 500 MG PO CAPS: 500.0000 mg | ORAL_CAPSULE | Freq: Three times a day (TID) | ORAL | 0 refills | Status: AC

## 2018-06-14 MED ORDER — DICLOFENAC SODIUM 1 % TD GEL: 4.0000 g | Freq: Four times a day (QID) | TRANSDERMAL | 11 refills | Status: DC | PRN

## 2018-06-14 MED ORDER — INSULIN GLARGINE 100 UNIT/ML SOLOSTAR PEN: 10.0000 [IU] | PEN_INJECTOR | Freq: Every day | SUBCUTANEOUS | 11 refills | Status: DC

## 2018-06-14 NOTE — Assessment & Plan Note (Signed)
C/w DJD, for volt gel prn,  to f/u any worsening symptoms or concerns

## 2018-06-14 NOTE — Assessment & Plan Note (Signed)
stable overall by history and exam, recent data reviewed with pt, and pt to continue medical treatment as before,  to f/u any worsening symptoms or concerns, for f/u lab today 

## 2018-06-14 NOTE — Assessment & Plan Note (Signed)
Mild to mod, for antibx course,  to f/u any worsening symptoms or concerns 

## 2018-06-14 NOTE — Assessment & Plan Note (Signed)
stable overall by history and exam, recent data reviewed with pt, and pt to continue medical treatment as before,  to f/u any worsening symptoms or concerns Lab Results  Component Value Date   LDLCALC 93 11/23/2017

## 2018-06-14 NOTE — Assessment & Plan Note (Signed)
Mild elevated today, declines med changes, o/w stable overall by history and exam, recent data reviewed with pt, and pt to continue medical treatment as before,  to f/u any worsening symptoms or concerns BP Readings from Last 3 Encounters:  06/14/18 (!) 142/84  05/31/18 139/69  05/29/18 (!) 146/82

## 2018-06-14 NOTE — Patient Instructions (Signed)
Please take all new medication as prescribed - the antibiotic, and anti-inflammatory topical gel for pain and swelling  Please continue all other medications as before, and refills have been done if requested.  Please have the pharmacy call with any other refills you may need.  Please continue your efforts at being more active, low cholesterol diet, and weight control.  Please keep your appointments with your specialists as you may have planned  Please go to the LAB in the Basement (turn left off the elevator) for the tests to be done today  You will be contacted by phone if any changes need to be made immediately.  Otherwise, you will receive a letter about your results with an explanation, but please check with MyChart first.  Please remember to sign up for MyChart if you have not done so, as this will be important to you in the future with finding out test results, communicating by private email, and scheduling acute appointments online when needed.  Please return in 6 months, or sooner if needed

## 2018-06-14 NOTE — Progress Notes (Signed)
Subjective:    Patient ID: [name], female    DOB: 03-18-1946, 72 y.o.   MRN: [id number]  HPI  Here to f/u; overall doing ok,  Pt denies chest pain, increasing sob or doe, wheezing, orthopnea, PND, increased LE swelling, palpitations, dizziness or syncope.  Pt denies new neurological symptoms such as new headache, or facial or extremity weakness or numbness.  Pt denies polydipsia, polyuria, or low sugar episode.  Pt states overall good compliance with meds, mostly trying to follow appropriate diet, with wt overall stable,  but little exercise however. CBG's in low 100's.  Not yet started HD.  Also with c/o Left wrist pain with post wrist tender and swelling, asking for volt gel, normally uses the cane in the right hand, but sometime on the left at times  Also incidentally with 2-3 days onset worsening redness and pain to distal RLE, without chills but starting to feel a bit ill.  No abscess or drainage.  But has been scratching quite a bit with several excoriations Past Medical History:  Diagnosis Date  . Anemia, iron deficiency   . Aortic atherosclerosis ([place]) 11/17/2017  . Arthritis   . CAD (coronary artery disease)   . Carotid stenosis    Carotid US (02/2014):  Bilateral ICA 40-59%; > 50% L ECA; F/u 1 year  . Cataract    removed both eyes  . Cholelithiasis 11/17/2017  . Chronic kidney disease    chronic renal failure  . DM2 (diabetes mellitus, type 2) ([place])   . GERD (gastroesophageal reflux disease)   . Glaucoma   . HLD (hyperlipidemia)   . HTN (hypertension)   . Hx of cardiovascular stress test    Lexiscan Myoview (03/24/14):  No ischemia, EF 73%, Normal  . Hypoglycemia 06/27/2017  . Mitral regurgitation   . Osteopenia 07/21/2017  . Osteoporosis   . PUD (peptic ulcer disease)   . PVD (peripheral vascular disease) ([place])   . Shortness of breath    with exertion   Past Surgical History:  Procedure Laterality Date  . ABDOMINAL HYSTERECTOMY    . APPENDECTOMY    . AV  FISTULA PLACEMENT Right 01/18/2013   Procedure: ARTERIOVENOUS (AV) FISTULA CREATION;  Surgeon: Rosetta Posner, MD;  Location: [facility];  Service: Vascular;  Laterality: Right;  Ultrasound guided  . COLONOSCOPY    . CORONARY ANGIOPLASTY WITH STENT PLACEMENT    . RENAL ANGIOGRAM N/A 09/16/2011   Procedure: RENAL ANGIOGRAM;  Surgeon: Sherren Mocha, MD;  Location: [facility] CATH LAB;  Service: Cardiovascular;  Laterality: N/A;    reports that she quit smoking about 10 years ago. She has never used smokeless tobacco. She reports that she does not drink alcohol or use drugs. family history includes Diabetes in her sister and son; Glaucoma in her father; Heart disease in her father. No Known Allergies Meds - see current list  Review of Systems  Constitutional: Negative for other unusual diaphoresis or sweats HENT: Negative for ear discharge or swelling Eyes: Negative for other worsening visual disturbances Respiratory: Negative for stridor or other swelling  Gastrointestinal: Negative for worsening distension or other blood Genitourinary: Negative for retention or other urinary change Musculoskeletal: Negative for other MSK pain or swelling Skin: Negative for color change or other new lesions Neurological: Negative for worsening tremors and other numbness  Psychiatric/Behavioral: Negative for worsening agitation or other fatigue All other system neg per pt    Objective:   Physical Exam BP (!) 142/84 (BP Location: Left Arm,  Patient Position: Sitting, Cuff Size: Large)   Pulse 78   Temp 97.7 F (36.5 C) (Oral)   Ht 5\' 1"  (1.549 m)   Wt 210 lb 3.2 oz (95.3 kg)   SpO2 96%   BMI 39.72 kg/m  VS noted,  Constitutional: Pt appears in NAD HENT: Head: NCAT.  Right Ear: External ear normal.  Left Ear: External ear normal.  Eyes: . Pupils are equal, round, and reactive to light. Conjunctivae and EOM are normal Nose: without d/c or deformity Neck: Neck supple. Gross normal ROM Cardiovascular: Normal rate  and regular rhythm.   Pulmonary/Chest: Effort normal and breath sounds without rales or wheezing.  Abd:  Soft, NT, ND, + BS, no organomegaly Neurological: Pt is alert. At baseline orientation, motor grossly intact Skin: Skin is warm. No rashes, other new lesions, no LE edema but distal RLE above the ankle with stocking glove diffuse erythema tender with mult scratches and excoriations, without specific area of abscess or drainage, area involves above the ankle to mid leg mostly anterior pretibial Left wrist with mild tender post prox hand/wrist area with mild swelling, decresaed ROM Psychiatric: Pt behavior is normal without agitation  No other exam finding  Lab Results  Component Value Date   WBC 8.9 11/23/2017   HGB 11.9 (L) 05/31/2018   HCT 37.7 11/23/2017   PLT 411.0 (H) 11/23/2017   GLUCOSE 245 (H) 11/23/2017   GLUCOSE 242 (H) 11/23/2017   CHOL 173 11/23/2017   TRIG 164.0 (H) 11/23/2017   HDL 46.60 11/23/2017   LDLDIRECT 105.0 06/08/2017   LDLCALC 93 11/23/2017   ALT 16 11/23/2017   ALT 19 11/23/2017   AST 10 11/23/2017   AST 15 11/23/2017   NA 138 11/23/2017   NA 138 11/23/2017   K 4.4 11/23/2017   K 4.4 11/23/2017   CL 99 11/23/2017   CL 98 11/23/2017   CREATININE 2.70 (H) 11/23/2017   CREATININE 2.64 (H) 11/23/2017   BUN 63 (H) 11/23/2017   BUN 63 (H) 11/23/2017   CO2 28 11/23/2017   CO2 25 11/23/2017   TSH 3.28 06/08/2017   HGBA1C 6.9 (H) 11/23/2017   MICROALBUR 19.3 (H) 11/23/2017       Assessment & Plan:

## 2018-06-17 DIAGNOSIS — R2689 Other abnormalities of gait and mobility: Secondary | ICD-10-CM | POA: Diagnosis not present

## 2018-06-17 DIAGNOSIS — M25561 Pain in right knee: Secondary | ICD-10-CM | POA: Diagnosis not present

## 2018-06-18 ENCOUNTER — Other Ambulatory Visit: Payer: Self-pay | Admitting: Internal Medicine

## 2018-06-28 ENCOUNTER — Ambulatory Visit (HOSPITAL_COMMUNITY)
Admission: RE | Admit: 2018-06-28 | Discharge: 2018-06-28 | Disposition: A | Discharge status: Home / Self Care | Payer: Medicare Other | Source: Ambulatory Visit | Attending: Nephrology | Admitting: Nephrology

## 2018-06-28 VITALS — BP 155/63 | HR 74 | Temp 98.3°F | Resp 20

## 2018-06-28 DIAGNOSIS — Z5181 Encounter for therapeutic drug level monitoring: Secondary | ICD-10-CM | POA: Diagnosis not present

## 2018-06-28 DIAGNOSIS — Z79899 Other long term (current) drug therapy: Secondary | ICD-10-CM | POA: Diagnosis not present

## 2018-06-28 DIAGNOSIS — D631 Anemia in chronic kidney disease: Secondary | ICD-10-CM | POA: Diagnosis not present

## 2018-06-28 DIAGNOSIS — N184 Chronic kidney disease, stage 4 (severe): Secondary | ICD-10-CM | POA: Insufficient documentation

## 2018-06-28 DIAGNOSIS — D539 Nutritional anemia, unspecified: Secondary | ICD-10-CM

## 2018-06-28 LAB — POCT HEMOGLOBIN-HEMACUE: Hemoglobin: 12.4 g/dL (ref 12.0–15.0)

## 2018-06-28 MED ORDER — EPOETIN ALFA 10000 UNIT/ML IJ SOLN: 20000.0000 [IU] | INTRAMUSCULAR | Status: DC

## 2018-07-04 DIAGNOSIS — M1A09X0 Idiopathic chronic gout, multiple sites, without tophus (tophi): Secondary | ICD-10-CM | POA: Diagnosis not present

## 2018-07-04 DIAGNOSIS — Z79899 Other long term (current) drug therapy: Secondary | ICD-10-CM | POA: Diagnosis not present

## 2018-07-04 DIAGNOSIS — N183 Chronic kidney disease, stage 3 (moderate): Secondary | ICD-10-CM | POA: Diagnosis not present

## 2018-07-04 DIAGNOSIS — M79671 Pain in right foot: Secondary | ICD-10-CM | POA: Diagnosis not present

## 2018-07-04 DIAGNOSIS — M1A9XX1 Chronic gout, unspecified, with tophus (tophi): Secondary | ICD-10-CM | POA: Diagnosis not present

## 2018-07-10 DIAGNOSIS — E119 Type 2 diabetes mellitus without complications: Secondary | ICD-10-CM | POA: Diagnosis not present

## 2018-07-10 DIAGNOSIS — H401114 Primary open-angle glaucoma, right eye, indeterminate stage: Secondary | ICD-10-CM | POA: Diagnosis not present

## 2018-07-10 DIAGNOSIS — H04123 Dry eye syndrome of bilateral lacrimal glands: Secondary | ICD-10-CM | POA: Diagnosis not present

## 2018-07-10 DIAGNOSIS — H401124 Primary open-angle glaucoma, left eye, indeterminate stage: Secondary | ICD-10-CM | POA: Diagnosis not present

## 2018-07-12 ENCOUNTER — Encounter (HOSPITAL_COMMUNITY)
Admission: RE | Admit: 2018-07-12 | Discharge: 2018-07-12 | Disposition: A | Discharge status: Home / Self Care | Payer: Medicare Other | Source: Ambulatory Visit | Attending: Nephrology | Admitting: Nephrology

## 2018-07-12 DIAGNOSIS — D631 Anemia in chronic kidney disease: Secondary | ICD-10-CM | POA: Insufficient documentation

## 2018-07-12 DIAGNOSIS — D539 Nutritional anemia, unspecified: Secondary | ICD-10-CM

## 2018-07-12 DIAGNOSIS — N184 Chronic kidney disease, stage 4 (severe): Secondary | ICD-10-CM | POA: Insufficient documentation

## 2018-07-12 LAB — POCT HEMOGLOBIN-HEMACUE: Hemoglobin: 12.5 g/dL (ref 12.0–15.0)

## 2018-07-12 MED ORDER — EPOETIN ALFA 10000 UNIT/ML IJ SOLN: 20000.0000 [IU] | INTRAMUSCULAR | Status: DC

## 2018-07-18 ENCOUNTER — Other Ambulatory Visit: Payer: Self-pay | Admitting: Internal Medicine

## 2018-07-18 DIAGNOSIS — R2689 Other abnormalities of gait and mobility: Secondary | ICD-10-CM | POA: Diagnosis not present

## 2018-07-18 DIAGNOSIS — M25561 Pain in right knee: Secondary | ICD-10-CM | POA: Diagnosis not present

## 2018-07-18 NOTE — Telephone Encounter (Signed)
Done erx 

## 2018-07-19 NOTE — Telephone Encounter (Signed)
MD approved and sent electronically to pof../lmb  

## 2018-07-23 ENCOUNTER — Other Ambulatory Visit: Payer: Self-pay | Admitting: Internal Medicine

## 2018-07-25 ENCOUNTER — Encounter (HOSPITAL_COMMUNITY): Payer: Self-pay | Admitting: Emergency Medicine

## 2018-07-25 ENCOUNTER — Observation Stay (HOSPITAL_COMMUNITY)
Admission: EM | Admit: 2018-07-25 | Discharge: 2018-07-27 | Disposition: A | Discharge status: Home / Self Care | Payer: Medicare Other | Attending: Family Medicine | Admitting: Family Medicine

## 2018-07-25 DIAGNOSIS — Z955 Presence of coronary angioplasty implant and graft: Secondary | ICD-10-CM | POA: Insufficient documentation

## 2018-07-25 DIAGNOSIS — N179 Acute kidney failure, unspecified: Secondary | ICD-10-CM | POA: Insufficient documentation

## 2018-07-25 DIAGNOSIS — E785 Hyperlipidemia, unspecified: Secondary | ICD-10-CM | POA: Insufficient documentation

## 2018-07-25 DIAGNOSIS — R0602 Shortness of breath: Secondary | ICD-10-CM | POA: Diagnosis not present

## 2018-07-25 DIAGNOSIS — E1051 Type 1 diabetes mellitus with diabetic peripheral angiopathy without gangrene: Secondary | ICD-10-CM | POA: Diagnosis not present

## 2018-07-25 DIAGNOSIS — Z7982 Long term (current) use of aspirin: Secondary | ICD-10-CM | POA: Insufficient documentation

## 2018-07-25 DIAGNOSIS — Z794 Long term (current) use of insulin: Secondary | ICD-10-CM | POA: Insufficient documentation

## 2018-07-25 DIAGNOSIS — Z87891 Personal history of nicotine dependence: Secondary | ICD-10-CM | POA: Insufficient documentation

## 2018-07-25 DIAGNOSIS — Z9842 Cataract extraction status, left eye: Secondary | ICD-10-CM | POA: Insufficient documentation

## 2018-07-25 DIAGNOSIS — Z90710 Acquired absence of both cervix and uterus: Secondary | ICD-10-CM | POA: Insufficient documentation

## 2018-07-25 DIAGNOSIS — Z8249 Family history of ischemic heart disease and other diseases of the circulatory system: Secondary | ICD-10-CM | POA: Insufficient documentation

## 2018-07-25 DIAGNOSIS — E1022 Type 1 diabetes mellitus with diabetic chronic kidney disease: Secondary | ICD-10-CM | POA: Diagnosis not present

## 2018-07-25 DIAGNOSIS — R52 Pain, unspecified: Secondary | ICD-10-CM

## 2018-07-25 DIAGNOSIS — I10 Essential (primary) hypertension: Secondary | ICD-10-CM | POA: Diagnosis present

## 2018-07-25 DIAGNOSIS — Z820 Family history of epilepsy and other diseases of the nervous system: Secondary | ICD-10-CM | POA: Insufficient documentation

## 2018-07-25 DIAGNOSIS — Z8711 Personal history of peptic ulcer disease: Secondary | ICD-10-CM | POA: Insufficient documentation

## 2018-07-25 DIAGNOSIS — E118 Type 2 diabetes mellitus with unspecified complications: Secondary | ICD-10-CM | POA: Diagnosis present

## 2018-07-25 DIAGNOSIS — M1990 Unspecified osteoarthritis, unspecified site: Secondary | ICD-10-CM | POA: Insufficient documentation

## 2018-07-25 DIAGNOSIS — L03116 Cellulitis of left lower limb: Secondary | ICD-10-CM | POA: Insufficient documentation

## 2018-07-25 DIAGNOSIS — I771 Stricture of artery: Secondary | ICD-10-CM | POA: Insufficient documentation

## 2018-07-25 DIAGNOSIS — E781 Pure hyperglyceridemia: Secondary | ICD-10-CM | POA: Diagnosis not present

## 2018-07-25 DIAGNOSIS — M810 Age-related osteoporosis without current pathological fracture: Secondary | ICD-10-CM | POA: Diagnosis not present

## 2018-07-25 DIAGNOSIS — I700 Atherosclerosis of aorta: Secondary | ICD-10-CM | POA: Diagnosis not present

## 2018-07-25 DIAGNOSIS — M8580 Other specified disorders of bone density and structure, unspecified site: Secondary | ICD-10-CM | POA: Diagnosis not present

## 2018-07-25 DIAGNOSIS — K5900 Constipation, unspecified: Secondary | ICD-10-CM | POA: Insufficient documentation

## 2018-07-25 DIAGNOSIS — I120 Hypertensive chronic kidney disease with stage 5 chronic kidney disease or end stage renal disease: Secondary | ICD-10-CM | POA: Diagnosis not present

## 2018-07-25 DIAGNOSIS — L0390 Cellulitis, unspecified: Secondary | ICD-10-CM | POA: Diagnosis present

## 2018-07-25 DIAGNOSIS — I340 Nonrheumatic mitral (valve) insufficiency: Secondary | ICD-10-CM | POA: Insufficient documentation

## 2018-07-25 DIAGNOSIS — D509 Iron deficiency anemia, unspecified: Secondary | ICD-10-CM | POA: Diagnosis not present

## 2018-07-25 DIAGNOSIS — M5412 Radiculopathy, cervical region: Secondary | ICD-10-CM | POA: Insufficient documentation

## 2018-07-25 DIAGNOSIS — I2510 Atherosclerotic heart disease of native coronary artery without angina pectoris: Secondary | ICD-10-CM | POA: Diagnosis not present

## 2018-07-25 DIAGNOSIS — Z9841 Cataract extraction status, right eye: Secondary | ICD-10-CM | POA: Diagnosis not present

## 2018-07-25 DIAGNOSIS — H409 Unspecified glaucoma: Secondary | ICD-10-CM | POA: Diagnosis not present

## 2018-07-25 DIAGNOSIS — I701 Atherosclerosis of renal artery: Secondary | ICD-10-CM | POA: Insufficient documentation

## 2018-07-25 DIAGNOSIS — N184 Chronic kidney disease, stage 4 (severe): Secondary | ICD-10-CM | POA: Diagnosis present

## 2018-07-25 DIAGNOSIS — M109 Gout, unspecified: Secondary | ICD-10-CM | POA: Insufficient documentation

## 2018-07-25 DIAGNOSIS — N185 Chronic kidney disease, stage 5: Secondary | ICD-10-CM | POA: Insufficient documentation

## 2018-07-25 DIAGNOSIS — D631 Anemia in chronic kidney disease: Secondary | ICD-10-CM | POA: Diagnosis not present

## 2018-07-25 DIAGNOSIS — Z833 Family history of diabetes mellitus: Secondary | ICD-10-CM | POA: Insufficient documentation

## 2018-07-25 DIAGNOSIS — G4700 Insomnia, unspecified: Secondary | ICD-10-CM | POA: Insufficient documentation

## 2018-07-25 DIAGNOSIS — L03115 Cellulitis of right lower limb: Principal | ICD-10-CM | POA: Diagnosis present

## 2018-07-25 DIAGNOSIS — G8929 Other chronic pain: Secondary | ICD-10-CM | POA: Insufficient documentation

## 2018-07-25 DIAGNOSIS — I739 Peripheral vascular disease, unspecified: Secondary | ICD-10-CM | POA: Diagnosis present

## 2018-07-25 DIAGNOSIS — Z7902 Long term (current) use of antithrombotics/antiplatelets: Secondary | ICD-10-CM | POA: Insufficient documentation

## 2018-07-25 DIAGNOSIS — Z79899 Other long term (current) drug therapy: Secondary | ICD-10-CM | POA: Insufficient documentation

## 2018-07-25 DIAGNOSIS — K219 Gastro-esophageal reflux disease without esophagitis: Secondary | ICD-10-CM | POA: Diagnosis not present

## 2018-07-25 MED ORDER — PIPERACILLIN-TAZOBACTAM 3.375 G IVPB 30 MIN
3.3750 g | Freq: Once | INTRAVENOUS | Status: AC
  Administered 2018-07-26: 3.375 g via INTRAVENOUS
  Filled 2018-07-25: qty 50

## 2018-07-25 MED ORDER — VANCOMYCIN HCL IN DEXTROSE 1-5 GM/200ML-% IV SOLN
1000.0000 mg | Freq: Once | INTRAVENOUS | Status: AC
  Administered 2018-07-26: 1000 mg via INTRAVENOUS
  Filled 2018-07-25: qty 200

## 2018-07-25 NOTE — ED Triage Notes (Signed)
Pt has erythema on right lower leg for about week. Reports painful with movement.

## 2018-07-26 ENCOUNTER — Observation Stay (HOSPITAL_BASED_OUTPATIENT_CLINIC_OR_DEPARTMENT_OTHER): Payer: Medicare Other

## 2018-07-26 ENCOUNTER — Observation Stay (HOSPITAL_COMMUNITY): Payer: Medicare Other

## 2018-07-26 ENCOUNTER — Other Ambulatory Visit: Payer: Self-pay

## 2018-07-26 ENCOUNTER — Encounter (HOSPITAL_COMMUNITY): Payer: Self-pay | Admitting: Internal Medicine

## 2018-07-26 ENCOUNTER — Ambulatory Visit (HOSPITAL_BASED_OUTPATIENT_CLINIC_OR_DEPARTMENT_OTHER): Payer: Medicare Other

## 2018-07-26 ENCOUNTER — Inpatient Hospital Stay (HOSPITAL_COMMUNITY)
Admission: RE | Admit: 2018-07-26 | Discharge: 2018-07-26 | Disposition: A | Discharge status: Home / Self Care | Payer: Medicare Other | Source: Ambulatory Visit | Attending: Nephrology | Admitting: Nephrology

## 2018-07-26 DIAGNOSIS — L03115 Cellulitis of right lower limb: Secondary | ICD-10-CM | POA: Diagnosis not present

## 2018-07-26 DIAGNOSIS — I10 Essential (primary) hypertension: Secondary | ICD-10-CM | POA: Diagnosis not present

## 2018-07-26 DIAGNOSIS — I739 Peripheral vascular disease, unspecified: Secondary | ICD-10-CM | POA: Diagnosis not present

## 2018-07-26 DIAGNOSIS — M7989 Other specified soft tissue disorders: Secondary | ICD-10-CM | POA: Diagnosis not present

## 2018-07-26 DIAGNOSIS — N184 Chronic kidney disease, stage 4 (severe): Secondary | ICD-10-CM

## 2018-07-26 DIAGNOSIS — L0390 Cellulitis, unspecified: Secondary | ICD-10-CM | POA: Diagnosis present

## 2018-07-26 DIAGNOSIS — R600 Localized edema: Secondary | ICD-10-CM | POA: Diagnosis not present

## 2018-07-26 DIAGNOSIS — E118 Type 2 diabetes mellitus with unspecified complications: Secondary | ICD-10-CM

## 2018-07-26 DIAGNOSIS — L03116 Cellulitis of left lower limb: Secondary | ICD-10-CM

## 2018-07-26 LAB — I-STAT CHEM 8, ED
BUN: 67 mg/dL — ABNORMAL HIGH (ref 8–23)
CREATININE: 2.6 mg/dL — AB (ref 0.44–1.00)
Calcium, Ion: 1.19 mmol/L (ref 1.15–1.40)
Chloride: 107 mmol/L (ref 98–111)
GLUCOSE: 90 mg/dL (ref 70–99)
HCT: 36 % (ref 36.0–46.0)
HEMOGLOBIN: 12.2 g/dL (ref 12.0–15.0)
POTASSIUM: 4.1 mmol/L (ref 3.5–5.1)
Sodium: 140 mmol/L (ref 135–145)
TCO2: 26 mmol/L (ref 22–32)

## 2018-07-26 LAB — CBC WITH DIFFERENTIAL/PLATELET
BASOS ABS: 0.1 10*3/uL (ref 0.0–0.1)
Basophils Relative: 1 %
Eosinophils Absolute: 0.8 10*3/uL — ABNORMAL HIGH (ref 0.0–0.7)
Eosinophils Relative: 8 %
HEMATOCRIT: 35.9 % — AB (ref 36.0–46.0)
Hemoglobin: 12.1 g/dL (ref 12.0–15.0)
LYMPHS ABS: 1.8 10*3/uL (ref 0.7–4.0)
Lymphocytes Relative: 18 %
MCH: 27.7 pg (ref 26.0–34.0)
MCHC: 33.7 g/dL (ref 30.0–36.0)
MCV: 82.2 fL (ref 78.0–100.0)
MONO ABS: 0.7 10*3/uL (ref 0.1–1.0)
Monocytes Relative: 7 %
NEUTROS ABS: 6.6 10*3/uL (ref 1.7–7.7)
Neutrophils Relative %: 67 %
Platelets: 380 10*3/uL (ref 150–400)
RBC: 4.37 MIL/uL (ref 3.87–5.11)
RDW: 14.7 % (ref 11.5–15.5)
WBC: 10 10*3/uL (ref 4.0–10.5)

## 2018-07-26 LAB — GLUCOSE, CAPILLARY
GLUCOSE-CAPILLARY: 223 mg/dL — AB (ref 70–99)
GLUCOSE-CAPILLARY: 251 mg/dL — AB (ref 70–99)
GLUCOSE-CAPILLARY: 239 mg/dL — AB (ref 70–99)
Glucose-Capillary: 165 mg/dL — ABNORMAL HIGH (ref 70–99)

## 2018-07-26 LAB — BASIC METABOLIC PANEL
Anion gap: 12 (ref 5–15)
BUN: 51 mg/dL — AB (ref 8–23)
CHLORIDE: 107 mmol/L (ref 98–111)
CO2: 21 mmol/L — ABNORMAL LOW (ref 22–32)
Calcium: 9 mg/dL (ref 8.9–10.3)
Creatinine, Ser: 2.43 mg/dL — ABNORMAL HIGH (ref 0.44–1.00)
GFR calc non Af Amer: 18 mL/min — ABNORMAL LOW (ref >60)
GFR, EST AFRICAN AMERICAN: 21 mL/min — AB (ref >60)
Glucose, Bld: 226 mg/dL — ABNORMAL HIGH (ref 70–99)
POTASSIUM: 3.4 mmol/L — AB (ref 3.5–5.1)
SODIUM: 140 mmol/L (ref 135–145)

## 2018-07-26 LAB — CBC
HEMATOCRIT: 35.3 % — AB (ref 36.0–46.0)
Hemoglobin: 11.5 g/dL — ABNORMAL LOW (ref 12.0–15.0)
MCH: 26.8 pg (ref 26.0–34.0)
MCHC: 32.6 g/dL (ref 30.0–36.0)
MCV: 82.3 fL (ref 78.0–100.0)
Platelets: 321 10*3/uL (ref 150–400)
RBC: 4.29 MIL/uL (ref 3.87–5.11)
RDW: 14.9 % (ref 11.5–15.5)
WBC: 7.8 10*3/uL (ref 4.0–10.5)

## 2018-07-26 LAB — URIC ACID: Uric Acid, Serum: 7.4 mg/dL — ABNORMAL HIGH (ref 2.5–7.1)

## 2018-07-26 MED ORDER — VANCOMYCIN HCL 500 MG IV SOLR
500.0000 mg | INTRAVENOUS | Status: DC
  Administered 2018-07-26: 500 mg via INTRAVENOUS
  Filled 2018-07-26 (×2): qty 500

## 2018-07-26 MED ORDER — TIZANIDINE HCL 4 MG PO TABS
4.0000 mg | ORAL_TABLET | Freq: Four times a day (QID) | ORAL | Status: DC | PRN
  Administered 2018-07-26 – 2018-07-27 (×2): 4 mg via ORAL
  Filled 2018-07-26 (×2): qty 1

## 2018-07-26 MED ORDER — PANTOPRAZOLE SODIUM 40 MG PO TBEC
40.0000 mg | DELAYED_RELEASE_TABLET | Freq: Every day | ORAL | Status: DC
  Administered 2018-07-26 – 2018-07-27 (×2): 40 mg via ORAL
  Filled 2018-07-26 (×3): qty 1

## 2018-07-26 MED ORDER — CLOPIDOGREL BISULFATE 75 MG PO TABS
75.0000 mg | ORAL_TABLET | Freq: Every day | ORAL | Status: DC
  Administered 2018-07-26 – 2018-07-27 (×2): 75 mg via ORAL
  Filled 2018-07-26 (×2): qty 1

## 2018-07-26 MED ORDER — POTASSIUM CHLORIDE CRYS ER 20 MEQ PO TBCR
20.0000 meq | EXTENDED_RELEASE_TABLET | Freq: Every day | ORAL | Status: DC
  Administered 2018-07-26 – 2018-07-27 (×2): 20 meq via ORAL
  Filled 2018-07-26 (×2): qty 1

## 2018-07-26 MED ORDER — HEPARIN SODIUM (PORCINE) 5000 UNIT/ML IJ SOLN
5000.0000 [IU] | Freq: Three times a day (TID) | INTRAMUSCULAR | Status: DC
  Administered 2018-07-26 – 2018-07-27 (×4): 5000 [IU] via SUBCUTANEOUS
  Filled 2018-07-26 (×4): qty 1

## 2018-07-26 MED ORDER — TRAMADOL HCL 50 MG PO TABS
50.0000 mg | ORAL_TABLET | Freq: Four times a day (QID) | ORAL | Status: DC | PRN
  Administered 2018-07-26 – 2018-07-27 (×4): 50 mg via ORAL
  Filled 2018-07-26 (×4): qty 1

## 2018-07-26 MED ORDER — AMLODIPINE BESYLATE 5 MG PO TABS
10.0000 mg | ORAL_TABLET | Freq: Every day | ORAL | Status: DC
  Administered 2018-07-26 – 2018-07-27 (×2): 10 mg via ORAL
  Filled 2018-07-26 (×2): qty 2

## 2018-07-26 MED ORDER — NITROGLYCERIN 0.4 MG SL SUBL: 0.4000 mg | SUBLINGUAL_TABLET | SUBLINGUAL | Status: DC | PRN

## 2018-07-26 MED ORDER — FERROUS SULFATE 325 (65 FE) MG PO TABS
325.0000 mg | ORAL_TABLET | Freq: Two times a day (BID) | ORAL | Status: DC
  Administered 2018-07-26 – 2018-07-27 (×3): 325 mg via ORAL
  Filled 2018-07-26 (×3): qty 1

## 2018-07-26 MED ORDER — ZOLPIDEM TARTRATE 5 MG PO TABS: 5.0000 mg | ORAL_TABLET | Freq: Every evening | ORAL | Status: DC | PRN

## 2018-07-26 MED ORDER — TIMOLOL MALEATE 0.5 % OP SOLN
1.0000 [drp] | Freq: Every day | OPHTHALMIC | Status: DC
  Administered 2018-07-26 – 2018-07-27 (×2): 1 [drp] via OPHTHALMIC
  Filled 2018-07-26: qty 5

## 2018-07-26 MED ORDER — ALLOPURINOL 100 MG PO TABS
100.0000 mg | ORAL_TABLET | Freq: Every day | ORAL | Status: DC
  Administered 2018-07-26 – 2018-07-27 (×2): 100 mg via ORAL
  Filled 2018-07-26 (×2): qty 1

## 2018-07-26 MED ORDER — METOPROLOL TARTRATE 25 MG PO TABS
100.0000 mg | ORAL_TABLET | Freq: Two times a day (BID) | ORAL | Status: DC
  Administered 2018-07-26 – 2018-07-27 (×4): 100 mg via ORAL
  Filled 2018-07-26 (×4): qty 4

## 2018-07-26 MED ORDER — PIPERACILLIN-TAZOBACTAM 3.375 G IVPB
3.3750 g | Freq: Three times a day (TID) | INTRAVENOUS | Status: DC
  Administered 2018-07-26 – 2018-07-27 (×4): 3.375 g via INTRAVENOUS
  Filled 2018-07-26 (×5): qty 50

## 2018-07-26 MED ORDER — CALCITRIOL 0.25 MCG PO CAPS
0.2500 ug | ORAL_CAPSULE | Freq: Every day | ORAL | Status: DC
  Administered 2018-07-26 – 2018-07-27 (×2): 0.25 ug via ORAL
  Filled 2018-07-26 (×2): qty 1

## 2018-07-26 MED ORDER — ASPIRIN EC 81 MG PO TBEC
81.0000 mg | DELAYED_RELEASE_TABLET | Freq: Every day | ORAL | Status: DC
  Administered 2018-07-26 – 2018-07-27 (×2): 81 mg via ORAL
  Filled 2018-07-26 (×2): qty 1

## 2018-07-26 MED ORDER — ONDANSETRON HCL 4 MG/2ML IJ SOLN: 4.0000 mg | Freq: Four times a day (QID) | INTRAMUSCULAR | Status: DC | PRN

## 2018-07-26 MED ORDER — VITAMIN B-1 100 MG PO TABS
50.0000 mg | ORAL_TABLET | Freq: Every day | ORAL | Status: DC
  Administered 2018-07-26 – 2018-07-27 (×2): 50 mg via ORAL
  Filled 2018-07-26 (×2): qty 1

## 2018-07-26 MED ORDER — ATORVASTATIN CALCIUM 80 MG PO TABS
80.0000 mg | ORAL_TABLET | Freq: Every day | ORAL | Status: DC
  Administered 2018-07-26: 80 mg via ORAL
  Filled 2018-07-26: qty 1
  Filled 2018-07-26: qty 2
  Filled 2018-07-26: qty 1

## 2018-07-26 MED ORDER — ONDANSETRON HCL 4 MG PO TABS: 4.0000 mg | ORAL_TABLET | Freq: Four times a day (QID) | ORAL | Status: DC | PRN

## 2018-07-26 MED ORDER — SODIUM CHLORIDE 0.9 % IV SOLN
INTRAVENOUS | Status: DC | PRN
  Administered 2018-07-26 (×2): 250 mL via INTRAVENOUS

## 2018-07-26 MED ORDER — INSULIN ASPART 100 UNIT/ML ~~LOC~~ SOLN
0.0000 [IU] | Freq: Three times a day (TID) | SUBCUTANEOUS | Status: DC
  Administered 2018-07-26: 2 [IU] via SUBCUTANEOUS
  Administered 2018-07-26: 5 [IU] via SUBCUTANEOUS
  Administered 2018-07-26 – 2018-07-27 (×2): 3 [IU] via SUBCUTANEOUS
  Administered 2018-07-27: 5 [IU] via SUBCUTANEOUS

## 2018-07-26 MED ORDER — POLYETHYLENE GLYCOL 3350 17 G PO PACK
17.0000 g | PACK | Freq: Every day | ORAL | Status: DC
  Administered 2018-07-26: 17 g via ORAL
  Filled 2018-07-26: qty 1

## 2018-07-26 MED ORDER — ACETAMINOPHEN 650 MG RE SUPP: 650.0000 mg | Freq: Four times a day (QID) | RECTAL | Status: DC | PRN

## 2018-07-26 MED ORDER — FUROSEMIDE 40 MG PO TABS
40.0000 mg | ORAL_TABLET | Freq: Two times a day (BID) | ORAL | Status: DC
  Administered 2018-07-26 – 2018-07-27 (×3): 40 mg via ORAL
  Filled 2018-07-26 (×4): qty 1

## 2018-07-26 MED ORDER — ACETAMINOPHEN 325 MG PO TABS
650.0000 mg | ORAL_TABLET | Freq: Four times a day (QID) | ORAL | Status: DC | PRN
  Administered 2018-07-26 (×2): 650 mg via ORAL
  Filled 2018-07-26 (×2): qty 2

## 2018-07-26 NOTE — Plan of Care (Signed)
Plan of care discussed.   

## 2018-07-26 NOTE — Progress Notes (Signed)
Inpatient Diabetes Program Recommendations  AACE/ADA: New Consensus Statement on Inpatient Glycemic Control (2015)  Target Ranges:  Prepandial:   less than 140 mg/dL      Peak postprandial:   less than 180 mg/dL (1-2 hours)      Critically ill patients:  140 - 180 mg/dL   Lab Results  Component Value Date   GLUCAP 251 (H) 07/26/2018   HGBA1C 7.9 (H) 06/14/2018    Review of Glycemic Control  Diabetes history: DM2 Outpatient Diabetes medications: Humalog 0-9 units bid, Not taking Lantus 10 units QD Current orders for Inpatient glycemic control:  Novolog 0-9 units tidwc  HgbA1C - 7.9% Post-prandials elevated.  Inpatient Diabetes Program Recommendations:     Add Novolog 3 units tidwc for meal coverage insulin if pt eats > 50% meal.  Continue to follow glucose trends.  Thank you. Lorenda Peck, RD, LDN, CDE Inpatient Diabetes Coordinator [phone]

## 2018-07-26 NOTE — ED Notes (Signed)
ED TO INPATIENT HANDOFF REPORT  Name/Age/Gender [name] 72 y.o. female  Code Status    Code Status Orders  (From admission, onward)         Start     Ordered   07/26/18 0053  Full code  Continuous     07/26/18 0053        Code Status History    Date Active Date Inactive Code Status Order ID Comments User Context   11/16/2017 1958 11/18/2017 1708 Full Code [id number]  Ashley Gravel, MD ED   06/27/2017 0806 06/29/2017 2159 Full Code [id number]  Ashley Dickens, MD ED      Home/SNF/Other Home  Chief Complaint Cellulitis  Level of Care/Admitting Diagnosis ED Disposition    ED Disposition Condition [facility] Area: [facility] [[id number]]  Level of Care: Med-Surg [16]  Diagnosis: Cellulitis [817711]  Admitting Physician: [name] [phone]  Attending Physician: [name] [phone]  PT Class (Do Not Modify): Observation [104]  PT Acc Code (Do Not Modify): Observation [10022]       Medical History Past Medical History:  Diagnosis Date  . Anemia, iron deficiency   . Aortic atherosclerosis ([place]) 11/17/2017  . Arthritis   . CAD (coronary artery disease)   . Carotid stenosis    Carotid US (02/2014):  Bilateral ICA 40-59%; > 50% L ECA; F/u 1 year  . Cataract    removed both eyes  . Cholelithiasis 11/17/2017  . Chronic kidney disease    chronic renal failure  . DM2 (diabetes mellitus, type 2) ([place])   . GERD (gastroesophageal reflux disease)   . Glaucoma   . HLD (hyperlipidemia)   . HTN (hypertension)   . Hx of cardiovascular stress test    Lexiscan Myoview (03/24/14):  No ischemia, EF 73%, Normal  . Hypoglycemia 06/27/2017  . Mitral regurgitation   . Osteopenia 07/21/2017  . Osteoporosis   . PUD (peptic ulcer disease)   . PVD (peripheral vascular disease) ([place])   . Shortness of breath    with exertion    Allergies No Known Allergies  IV Location/Drains/Wounds Patient Lines/Drains/Airways Status   Active  Line/Drains/Airways    Name:   Placement date:   Placement time:   Site:   Days:   Peripheral IV 07/26/18 Left;Anterior Forearm   07/26/18    0005    Forearm   less than 1   Vascular Access Right Forearm Arteriovenous fistula   01/18/13    0803    Forearm   2015   Incision 01/18/13 Arm Right   01/18/13    0757     2015          Labs/Imaging Results for orders placed or performed during the hospital encounter of 07/25/18 (from the past 48 hour(s))  CBC with Differential/Platelet     Status: Abnormal   Collection Time: 07/26/18 12:09 AM  Result Value Ref Range   WBC 10.0 4.0 - 10.5 K/uL   RBC 4.37 3.87 - 5.11 MIL/uL   Hemoglobin 12.1 12.0 - 15.0 g/dL   HCT 35.9 (L) 36.0 - 46.0 %   MCV 82.2 78.0 - 100.0 fL   MCH 27.7 26.0 - 34.0 pg   MCHC 33.7 30.0 - 36.0 g/dL   RDW 14.7 11.5 - 15.5 %   Platelets 380 150 - 400 K/uL   Neutrophils Relative % 67 %   Neutro Abs 6.6 1.7 - 7.7 K/uL   Lymphocytes Relative 18 %  Lymphs Abs 1.8 0.7 - 4.0 K/uL   Monocytes Relative 7 %   Monocytes Absolute 0.7 0.1 - 1.0 K/uL   Eosinophils Relative 8 %   Eosinophils Absolute 0.8 (H) 0.0 - 0.7 K/uL   Basophils Relative 1 %   Basophils Absolute 0.1 0.0 - 0.1 K/uL    Comment: Performed at [facility], [place] [address], [place], [place] [address]  I-Stat Chem 8, ED     Status: Abnormal   Collection Time: 07/26/18 12:16 AM  Result Value Ref Range   Sodium 140 135 - 145 mmol/L   Potassium 4.1 3.5 - 5.1 mmol/L   Chloride 107 98 - 111 mmol/L   BUN 67 (H) 8 - 23 mg/dL   Creatinine, Ser 2.60 (H) 0.44 - 1.00 mg/dL   Glucose, Bld 90 70 - 99 mg/dL   Calcium, Ion 1.19 1.15 - 1.40 mmol/L   TCO2 26 22 - 32 mmol/L   Hemoglobin 12.2 12.0 - 15.0 g/dL   HCT 36.0 36.0 - 46.0 %   No results found.  Pending Labs Unresulted Labs (From admission, onward)    Start     Ordered   07/26/18 2774  Basic metabolic panel  Tomorrow morning,   R     07/26/18 0053   07/26/18 0500  CBC  Tomorrow morning,   R      07/26/18 0053   07/26/18 0053  CBC  (heparin)  Once,   R    Comments:  Baseline for heparin therapy IF NOT ALREADY DRAWN.  Notify MD if PLT < 100 K.    07/26/18 0053   07/26/18 0053  Creatinine, serum  (heparin)  Once,   R    Comments:  Baseline for heparin therapy IF NOT ALREADY DRAWN.    07/26/18 0053   07/25/18 2338  Blood culture (routine x 2)  BLOOD CULTURE X 2,   STAT     07/25/18 2338          Vitals/Pain Today's Vitals   07/25/18 1830 07/25/18 1831 07/25/18 2025 07/26/18 0000  BP:  (!) 150/79 (!) 163/77 (!) 178/74  Pulse:  81 62 69  Resp:  19 18 17   Temp:  98 F (36.7 C)    TempSrc:  Oral    SpO2:  96% 100% 97%  Weight: 210 lb 9 oz (95.5 kg)     Height: 5\' 1"  (1.549 m)       Isolation Precautions No active isolations  Medications Medications  vancomycin (VANCOCIN) IVPB 1000 mg/200 mL premix (1,000 mg Intravenous New Bag/Given 07/26/18 0017)  allopurinol (ZYLOPRIM) tablet 100 mg (has no administration in time range)  aspirin tablet 81 mg (has no administration in time range)  traMADol (ULTRAM) tablet 50 mg (has no administration in time range)  amLODipine (NORVASC) tablet 10 mg (has no administration in time range)  atorvastatin (LIPITOR) tablet 80 mg (has no administration in time range)  furosemide (LASIX) tablet 40 mg (has no administration in time range)  metoprolol tartrate (LOPRESSOR) tablet 100 mg (has no administration in time range)  nitroGLYCERIN (NITROSTAT) SL tablet 0.4 mg (has no administration in time range)  zolpidem (AMBIEN) tablet 5 mg (has no administration in time range)  calcitRIOL (ROCALTROL) capsule 0.25 mcg (has no administration in time range)  pantoprazole (PROTONIX) EC tablet 40 mg (has no administration in time range)  clopidogrel (PLAVIX) tablet 75 mg (has no administration in time range)  polyethylene glycol powder (GLYCOLAX/MIRALAX) container 127.5 g (has no  administration in time range)  ferrous sulfate tablet 325 mg (has no  administration in time range)  tiZANidine (ZANAFLEX) tablet 4 mg (has no administration in time range)  Potassium Chloride ER TBCR 20 mEq (has no administration in time range)  thiamine (VITAMIN B-1) tablet 50 mg (has no administration in time range)  timolol (BETIMOL) 0.5 % ophthalmic solution 1 drop (has no administration in time range)  acetaminophen (TYLENOL) tablet 650 mg (has no administration in time range)    Or  acetaminophen (TYLENOL) suppository 650 mg (has no administration in time range)  ondansetron (ZOFRAN) tablet 4 mg (has no administration in time range)    Or  ondansetron (ZOFRAN) injection 4 mg (has no administration in time range)  insulin aspart (novoLOG) injection 0-9 Units (has no administration in time range)  heparin injection 5,000 Units (has no administration in time range)  piperacillin-tazobactam (ZOSYN) IVPB 3.375 g (0 g Intravenous Stopped 07/26/18 0102)    Mobility walks

## 2018-07-26 NOTE — Progress Notes (Signed)
VASCULAR LAB PRELIMINARY  ARTERIAL  ABI completed:    RIGHT    LEFT    PRESSURE WAVEFORM  PRESSURE WAVEFORM  BRACHIAL 159 Triphasic BRACHIAL 180 Triphasic  DP 95 Monophasic DP 80 Monophasic  AT   AT    PT 83 Monophasic PT 75 Monophasic  PER   PER    GREAT TOE 36 NA GREAT TOE 40 NA    RIGHT LEFT  ABI 0.53 0.44  TBI 0.20 0.22   The right ABI is suggestive of moderate arterial insufficiency at rest. The left ABI is suggestive of severe arterial insufficiency at rest. Bilateral TBIs are abnormal at rest.  Compared to the previous study from 12/26/17, there is reduction in bilateral ABIs and TBIs bilaterally.  Preliminary results discussed with Dr. Darrick Meigs.  07/26/2018 1:29 PM Maudry Mayhew, MHA, RVT, RDCS, RDMS

## 2018-07-26 NOTE — Progress Notes (Signed)
Subjective: Patient admitted this morning, see detailed H&P by Dr Hal Hope 71 y.o. female with history of chronic kidney disease stage IV-V, gout, diabetes mellitus, hypertension, anemia, CAD status post stenting presents to the ER because of worsening pain in the right lower extremity involving the right foot and lower part of her right leg.  Patient denies any pain in  the leg  Vitals:   07/26/18 0525 07/26/18 1403  BP: (!) 149/127 (!) 146/66  Pulse: 66 68  Resp: 20 20  Temp: 97.8 F (36.6 C) 98 F (36.7 C)  SpO2: 98% 97%      A/P Right lower extremity cellulitis Moderate to severe arterial insufficiency, seen on ABI today right 0.53, left 0.44 History of CAD Hypertension Diabetes mellitus  Continue vancomycin and Zosyn We will consult vascular surgery for further recommendations     [place] Hospitalist Pager- [phone]

## 2018-07-26 NOTE — ED Provider Notes (Signed)
[facility] DEPT Provider Note   CSN: [id number] Arrival date & time: 07/25/18  1807     History   Chief Complaint Chief Complaint  Patient presents with  . Cellulitis    HPI [name] is a 72 y.o. female.  The history is provided by the patient and a relative.  Rash   This is a new problem. The current episode started more than 1 week ago. The problem has been gradually worsening. The problem is associated with nothing. There has been no fever. The rash is present on the right lower leg. The pain is moderate. The pain has been constant since onset. Associated symptoms include itching and weeping. Pertinent negatives include no blisters. Treatments tried: recent antibiotics. The treatment provided no relief. Risk factors: diabetes and vascular disease.  No f/c/r.  No streaking.    Past Medical History:  Diagnosis Date  . Anemia, iron deficiency   . Aortic atherosclerosis ([place]) 11/17/2017  . Arthritis   . CAD (coronary artery disease)   . Carotid stenosis    Carotid US (02/2014):  Bilateral ICA 40-59%; > 50% L ECA; F/u 1 year  . Cataract    removed both eyes  . Cholelithiasis 11/17/2017  . Chronic kidney disease    chronic renal failure  . DM2 (diabetes mellitus, type 2) ([place])   . GERD (gastroesophageal reflux disease)   . Glaucoma   . HLD (hyperlipidemia)   . HTN (hypertension)   . Hx of cardiovascular stress test    Lexiscan Myoview (03/24/14):  No ischemia, EF 73%, Normal  . Hypoglycemia 06/27/2017  . Mitral regurgitation   . Osteopenia 07/21/2017  . Osteoporosis   . PUD (peptic ulcer disease)   . PVD (peripheral vascular disease) ([place])   . Shortness of breath    with exertion    Patient Active Problem List   Diagnosis Date Noted  . Cellulitis of both lower extremities 07/26/2018  . Cellulitis of right leg 06/14/2018  . Left wrist pain 06/14/2018  . Constipation 12/13/2017  . Hyperlipidemia LDL goal <130 11/23/2017  .  Right upper quadrant abdominal tenderness without rebound tenderness 11/23/2017  . Cholelithiasis 11/17/2017  . Chronic kidney disease (CKD), stage IV (severe) ([place]) 11/17/2017  . Aortic atherosclerosis ([place]) 11/17/2017  . Diabetes mellitus with complication ([place]) 63/78/5885  . 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 Somnolence 06/27/2017  . Chronic pain 06/27/2017  . Bilateral hand numbness 06/08/2017  . Low back pain 03/09/2017  . Insomnia 12/27/2016  . Deficiency anemia 05/19/2016  . End stage renal disease ([place]) 01/15/2013  . Bilateral foot pain 09/07/2012  . Sciatica of left side 03/10/2012  . 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 Renal artery stenosis ([place]) 04/25/2011  . Encounter for well adult exam with abnormal findings 04/12/2011  . PERIPHERAL EDEMA 04/01/2010  . B12 deficiency anemia 10/01/2009  . CERVICAL RADICULOPATHY 06/17/2008  . ANEMIA-IRON DEFICIENCY 12/13/2007  . PERIPHERAL VASCULAR DISEASE 12/13/2007  . Pure hyperglyceridemia 06/11/2007  . GLAUCOMA NOS 06/11/2007  . MITRAL REGURGITATION, 2 PLUS 06/11/2007  . Essential hypertension 06/11/2007  . CAD in native artery 06/11/2007  . CAROTID ARTERY STENOSIS 06/11/2007  . GERD 06/11/2007  . Peptic Ulcer, Site NOS 06/11/2007  . OSTEOPOROSIS 06/11/2007    Past Surgical History:  Procedure Laterality Date  . ABDOMINAL HYSTERECTOMY    . APPENDECTOMY    . AV FISTULA PLACEMENT Right 01/18/2013   Procedure: ARTERIOVENOUS (AV)  FISTULA CREATION;  Surgeon: Rosetta Posner, MD;  Location: [facility];  Service: Vascular;  Laterality: Right;  Ultrasound guided  . COLONOSCOPY    . CORONARY ANGIOPLASTY WITH STENT PLACEMENT    . RENAL ANGIOGRAM N/A 09/16/2011   Procedure: RENAL ANGIOGRAM;  Surgeon: Sherren Mocha, MD;  Location[facility] LAB;  Service: Cardiovascular;  Laterality: N/A;     OB History   None      Home Medications      Family History Family History  Problem Relation Age  of Onset  . Heart disease Father   . Glaucoma Father   . Diabetes Sister   . Diabetes Son   . Colon cancer Neg Hx   . Colon polyps Neg Hx   . Esophageal cancer Neg Hx   . Rectal cancer Neg Hx   . Stomach cancer Neg Hx     Social History Social History   Tobacco Use  . Smoking status: Former Smoker    Last attempt to quit: 10/25/2007    Years since quitting: 10.7  . Smokeless tobacco: Never Used  . Tobacco comment: Stopped 1.5 yrs ago January 2009  Substance Use Topics  . Alcohol use: No  . Drug use: No     Allergies   Patient has no known allergies.   Review of Systems Review of Systems  Constitutional: Negative for diaphoresis and fever.  Respiratory: Negative for shortness of breath.   Cardiovascular: Negative for chest pain.  Gastrointestinal: Negative for abdominal pain, diarrhea and vomiting.  Skin: Positive for color change, itching and rash.  All other systems reviewed and are negative.    Physical Exam Updated Vital Signs BP (!) 178/74 (BP Location: Left Arm)   Pulse 69   Temp 98 F (36.7 C) (Oral)   Resp 17   Ht 5\' 1"  (1.549 m)   Wt 95.5 kg   SpO2 97%   BMI 39.79 kg/m   Physical Exam  Constitutional: She appears well-developed and well-nourished. No distress.  HENT:  Head: Normocephalic.  Mouth/Throat: No oropharyngeal exudate.  Eyes: Pupils are equal, round, and reactive to light. Conjunctivae are normal.  Neck: Normal range of motion. Neck supple.  Cardiovascular: Normal rate, regular rhythm, normal heart sounds and intact distal pulses.  Pulmonary/Chest: Effort normal and breath sounds normal. No stridor. She has no wheezes. She has no rales.  Abdominal: Soft. Bowel sounds are normal. She exhibits no mass. There is no tenderness. There is no rebound and no guarding.  Musculoskeletal: Normal range of motion.  Neurological: She is alert. She displays normal reflexes.  Skin: Skin is warm and dry. Capillary refill takes less than 2 seconds.  There is erythema.  excoriations  Psychiatric: She has a normal mood and affect.     ED Treatments / Results  Labs (all labs ordered are listed, but only abnormal results are displayed) Results for orders placed or performed during the hospital encounter of 07/25/18  CBC with Differential/Platelet  Result Value Ref Range   WBC 10.0 4.0 - 10.5 K/uL   RBC 4.37 3.87 - 5.11 MIL/uL   Hemoglobin 12.1 12.0 - 15.0 g/dL   HCT 35.9 (L) 36.0 - 46.0 %   MCV 82.2 78.0 - 100.0 fL   MCH 27.7 26.0 - 34.0 pg   MCHC 33.7 30.0 - 36.0 g/dL   RDW 14.7 11.5 - 15.5 %   Platelets 380 150 - 400 K/uL   Neutrophils Relative % 67 %   Neutro Abs 6.6 1.7 -  7.7 K/uL   Lymphocytes Relative 18 %   Lymphs Abs 1.8 0.7 - 4.0 K/uL   Monocytes Relative 7 %   Monocytes Absolute 0.7 0.1 - 1.0 K/uL   Eosinophils Relative 8 %   Eosinophils Absolute 0.8 (H) 0.0 - 0.7 K/uL   Basophils Relative 1 %   Basophils Absolute 0.1 0.0 - 0.1 K/uL  I-Stat Chem 8, ED  Result Value Ref Range   Sodium 140 135 - 145 mmol/L   Potassium 4.1 3.5 - 5.1 mmol/L   Chloride 107 98 - 111 mmol/L   BUN 67 (H) 8 - 23 mg/dL   Creatinine, Ser 2.60 (H) 0.44 - 1.00 mg/dL   Glucose, Bld 90 70 - 99 mg/dL   Calcium, Ion 1.19 1.15 - 1.40 mmol/L   TCO2 26 22 - 32 mmol/L   Hemoglobin 12.2 12.0 - 15.0 g/dL   HCT 36.0 36.0 - 46.0 %   No results found.  EKG None  Radiology No results found.  Procedures Procedures (including critical care time)  Medications Ordered in ED Medications  vancomycin (VANCOCIN) IVPB 1000 mg/200 mL premix (1,000 mg Intravenous New Bag/Given 07/26/18 0017)  piperacillin-tazobactam (ZOSYN) IVPB 3.375 g (3.375 g Intravenous New Bag/Given 07/26/18 0020)      Final Clinical Impressions(s) / ED Diagnoses   Final diagnoses:  Cellulitis of right lower extremity    Will admit for cellulitis.     Takiah Maiden, MD 07/26/18 [id number]

## 2018-07-26 NOTE — Progress Notes (Signed)
Pharmacy Antibiotic Note  [name] is a 72 y.o. female admitted on 07/25/2018 with cellulitis.  Pharmacy has been consulted for zosyn and vancomycin dosing.  Plan: Zosyn 3.375g IV q8h (4 hour infusion).  If Scr worsens may need to adjust zosyn and check VR for dose Vancomycin 1 Gm x1 then 500 mg IV q36h for est AUC = 480 Goal AUC = 400-500 F/u scr/cultures/levels  Height: 5\' 1"  (154.9 cm) Weight: 210 lb 9 oz (95.5 kg) IBW/kg (Calculated) : 47.8  Temp (24hrs), Avg:98 F (36.7 C), Min:98 F (36.7 C), Max:98 F (36.7 C)  Recent Labs  Lab 07/26/18 0009 07/26/18 0016  WBC 10.0  --   CREATININE  --  2.60*    Estimated Creatinine Clearance: 20 mL/min (A) (by C-G formula based on SCr of 2.6 mg/dL (H)).    No Known Allergies  Antimicrobials this admission: 10/2 zosyn >>  10/2 vancomycin >>   Dose adjustments this admission:   Microbiology results:  BCx:   UCx:    Sputum:    MRSA PCR:   Thank you for allowing pharmacy to be a part of this patient's care.  Dorrene German 07/26/2018 1:06 AM

## 2018-07-26 NOTE — Progress Notes (Signed)
Bilateral lower extremity venous duplex completed - preliminary results - There is no evidence of DVT or Baker's cyst. Toma Copier, RVS 10?12/2017, 12:33 PM

## 2018-07-26 NOTE — Consult Note (Signed)
Hospital Consult    Reason for Consult: Right lower extremity cellulitis Referring Physician: Dr. Darrick Meigs MRN #:  [id number]  History of Present Illness: This is a 72 y.o. female with history of chronic kidney disease, diabetes and has undergone fistula creation by Dr. Donnetta Hutching in the past that was not usable.  She was recently seen by him in May to evaluate for bilateral lower extremity pain this was not thought to be arterial in nature.  She was noted to have mildly decreased ABIs at that time with concern for occlusion of the left SFA and likely multi-level disease in the right lower extremity.  She now presents with cellulitis of her right lower extremity.  She does continue to walk with the help of a cane at home is walking with aid of a walker here.  Now is not having fevers.  She does take aspirin Plavix daily.   Past Medical History:  Diagnosis Date  . Anemia, iron deficiency   . Aortic atherosclerosis ([place]) 11/17/2017  . Arthritis   . CAD (coronary artery disease)   . Carotid stenosis    Carotid US (02/2014):  Bilateral ICA 40-59%; > 50% L ECA; F/u 1 year  . Cataract    removed both eyes  . Cholelithiasis 11/17/2017  . Chronic kidney disease    chronic renal failure  . DM2 (diabetes mellitus, type 2) ([place])   . GERD (gastroesophageal reflux disease)   . Glaucoma   . HLD (hyperlipidemia)   . HTN (hypertension)   . Hx of cardiovascular stress test    Lexiscan Myoview (03/24/14):  No ischemia, EF 73%, Normal  . Hypoglycemia 06/27/2017  . Mitral regurgitation   . Osteopenia 07/21/2017  . Osteoporosis   . PUD (peptic ulcer disease)   . PVD (peripheral vascular disease) ([place])   . Shortness of breath    with exertion    Past Surgical History:  Procedure Laterality Date  . ABDOMINAL HYSTERECTOMY    . APPENDECTOMY    . AV FISTULA PLACEMENT Right 01/18/2013   Procedure: ARTERIOVENOUS (AV) FISTULA CREATION;  Surgeon: Rosetta Posner, MD;  Location: [facility];  Service: Vascular;  Laterality:  Right;  Ultrasound guided  . COLONOSCOPY    . CORONARY ANGIOPLASTY WITH STENT PLACEMENT    . RENAL ANGIOGRAM N/A 09/16/2011   Procedure: RENAL ANGIOGRAM;  Surgeon: Sherren Mocha, MD;  Location: [facility] CATH LAB;  Service: Cardiovascular;  Laterality: N/A;    No Known Allergies  Prior to Admission medications   Medication Sig Start Date End Date Taking? Authorizing Provider  acetaminophen (TYLENOL) 500 MG tablet Take 1-2 tablets (500-1,000 mg total) by mouth every 8 (eight) hours as needed for mild pain. 06/29/17  Yes Hongalgi, Lenis Dickinson, MD  allopurinol (ZYLOPRIM) 100 MG tablet Take 100 mg by mouth daily.   Yes [provider]  amLODipine (NORVASC) 10 MG tablet TAKE ONE (1) TABLET EACH DAY Patient taking differently: Take 10 mg by mouth daily.  03/05/18  Yes [name], MD  aspirin 81 MG tablet Take 81 mg by mouth daily.   Yes [provider]  atorvastatin (LIPITOR) 80 MG tablet TAKE 1 TABLET (80 MG TOTAL) BY MOUTH DAILY AT 6 PM.* NEED APPT 02/20/18  Yes [name], MD  calcitRIOL (ROCALTROL) 0.25 MCG capsule Take 0.25 mcg by mouth daily.   Yes [provider]  clopidogrel (PLAVIX) 75 MG tablet TAKE 1 TABLET BY MOUTH EVERY DAY 07/23/18  Yes [name], MD  Cyanocobalamin (B-12  PO) Take 1 tablet by mouth daily.   Yes [provider]  diclofenac sodium (VOLTAREN) 1 % GEL Apply 4 g topically 4 (four) times daily as needed. 06/14/18  Yes [name], MD  ferrous sulfate 325 (65 FE) MG tablet TAKE 1 TABLET (325 MG TOTAL) BY MOUTH 2 (TWO) TIMES DAILY. Patient taking differently: Take 325 mg by mouth 2 (two) times daily with a meal. TAKE 1 TABLET (325 MG TOTAL) BY MOUTH 2 (TWO) TIMES DAILY. 06/18/18  Yes [name], MD  furosemide (LASIX) 80 MG tablet Take 0.5 tablets (40 mg total) by mouth 2 (two) times daily. 06/29/17  Yes Hongalgi, Lenis Dickinson, MD  HUMALOG KWIKPEN 100 UNIT/ML KiwkPen INJECT 0-0.09 MLS (0-9 UNITS TOTAL) INTO THE SKIN 2 TIMES DAILY Patient taking  differently: Inject 0-9 Units into the skin 2 (two) times daily.  04/16/18  Yes [name], MD  metoprolol tartrate (LOPRESSOR) 100 MG tablet TAKE 1 TABLET BY MOUTH TWICE A DAY 08/19/17  Yes [name], MD  omeprazole (PRILOSEC) 20 MG capsule TAKE 1 CAPSULE (20 MG TOTAL) BY MOUTH DAILY. Patient taking differently: Take 20 mg by mouth daily.  06/04/18  Yes [name], MD  polyethylene glycol powder [facility]) powder Take 17 gm by mouth daily 12/13/17  Yes [name], MD  Potassium Chloride ER 20 MEQ TBCR Take 20 mEq by mouth daily.  05/05/18  Yes [provider]  risedronate (ACTONEL) 150 MG tablet TAKE 1 TABLET BY MOUTH ONCE A MONTH-TAKE ON THE SAME DAY OF EACH MONTH. Patient taking differently: Take 150 mg by mouth every 30 (thirty) days. First Tuesday 05/16/18  Yes [name], MD  Tetrahydrozoline HCl (VISINE OP) Place 2 drops into both eyes daily as needed (DRY EYE).   Yes [provider]  thiamine (VITAMIN B-1) 50 MG tablet Take 1 tablet (50 mg total) by mouth daily. 11/27/17  Yes Janith Lima, MD  timolol (BETIMOL) 0.5 % ophthalmic solution Place 1 drop into both eyes daily.    Yes [provider]  tiZANidine (ZANAFLEX) 4 MG tablet TAKE 1 TABLET (4 MG TOTAL) BY MOUTH EVERY 6 (SIX) HOURS AS NEEDED FOR MUSCLE SPASMS. 12/13/17  Yes [name], MD  traMADol (ULTRAM) 50 MG tablet TAKE 1 TABLET BY MOUTH EVERY 6 HOURS AS NEEDED 07/18/18  Yes [name], MD  zolpidem (AMBIEN) 5 MG tablet Take 1 tablet (5 mg total) by mouth at bedtime as needed for sleep. 02/14/18  Yes [name], MD  BD PEN NEEDLE NANO U/F 32G X 4 MM MISC USE DAILY WITH LANTUS SOLASTAR 05/20/14   [name], MD  blood glucose meter kit and supplies KIT Dispense based on patient and insurance preference. Use up to three times daily as directed. (FOR ICD-9 250.00, 250.01). 04/06/16   [name], MD  glucose blood test strip [facility] - Use as instructed three time daily  E11.9  12/13/17   [name], MD  Insulin Glargine (LANTUS SOLOSTAR) 100 UNIT/ML Solostar Pen Inject 10 Units into the skin daily. Patient not taking: Reported on 07/25/2018 06/14/18   [name], MD  Lancets MISC Use as directed three times daily with meals 03/09/17   [name], MD  nitroGLYCERIN (NITROSTAT) 0.4 MG SL tablet Place 1 tablet (0.4 mg total) under the tongue every 5 (five) minutes as needed for chest pain. Reported on 04/06/2016 12/04/17   Sherren Mocha, MD  Social History   Socioeconomic History  . Marital status: Single    Spouse name: Not on file  . Number of children: 3  . Years of education: Not on file  . Highest education level: Not on file  Occupational History  . Occupation: homemaker  Social Needs  . Financial resource strain: Not hard at all  . Food insecurity:    Worry: Never true    Inability: Never true  . Transportation needs:    Medical: No    Non-medical: No  Tobacco Use  . Smoking status: Former Smoker    Last attempt to quit: 10/25/2007    Years since quitting: 10.7  . Smokeless tobacco: Never Used  . Tobacco comment: Stopped 1.5 yrs ago January 2009  Substance and Sexual Activity  . Alcohol use: No  . Drug use: No  . Sexual activity: Never  Lifestyle  . Physical activity:    Days per week: 0 days    Minutes per session: 0 min  . Stress: Not at all  Relationships  . Social connections:    Talks on phone: More than three times a week    Gets together: More than three times a week    Attends religious service: Not on file    Active member of club or organization: Not on file    Attends meetings of clubs or organizations: Not on file    Relationship status: Not on file  . Intimate partner violence:    Fear of current or ex partner: Not on file    Emotionally abused: Not on file    Physically abused: Not on file    Forced sexual activity: Not on file  Other Topics Concern  . Not on file  Social History Narrative  . Not on file      Family History  Problem Relation Age of Onset  . Heart disease Father   . Glaucoma Father   . Diabetes Sister   . Diabetes Son   . Colon cancer Neg Hx   . Colon polyps Neg Hx   . Esophageal cancer Neg Hx   . Rectal cancer Neg Hx   . Stomach cancer Neg Hx     ROS: _0  Positive   _1  Negative   _2  All sytems reviewed and are negative  Cardiovascular: _3  chest pain/pressure _4  palpitations _5  SOB lying flat _6  DOE _7  pain in legs while walking _8  pain in legs at rest _9  pain in legs at night _10  non-healing ulcers _11  hx of DVT _12  swelling in legs  Pulmonary: _13  productive cough _14  asthma/wheezing _15  home O2  Neurologic: _16  weakness in _17  arms _18  legs _19  numbness in _20  arms _21  legs _22  hx of CVA _23  mini stroke _24 difficulty speaking or slurred speech _25  temporary loss of vision in one eye _26  dizziness  Hematologic: _27  hx of cancer _28  bleeding problems _29  problems with blood clotting easily  Endocrine:   _30  diabetes _31  thyroid disease  GI _32  vomiting blood _33  blood in stool  GU: _34  CKD/renal failure _35  HD--_36  M/W/F or _37  T/T/S _38  burning with urination _39  blood in urine  Psychiatric: _40  anxiety _41  depression  Musculoskeletal: _42  arthritis _43  joint pain  Integumentary: _44  rashes _45  ulcers  Constitutional: _46  fever _47  chills   Physical Examination  Vitals:   07/26/18 0525 07/26/18 1403  BP: (!) 149/127 (!) 146/66  Pulse: 66 68  Resp: 20 20  Temp: 97.8 F (36.6 C) 98 F (36.7  C)  SpO2: 98% 97%   Body mass index is 37.99 kg/m.  General:  WDWN in NAD Gait: Not observed HENT: WNL, normocephalic Pulmonary: normal non-labored breathing Cardiac: Weak femoral pulses bilaterally I cannot palpate popliteal or pedal pulses bilaterally Abdomen: soft, NT/ND, no masses Skin: Changes in the right leg appear to be consistent with C4 venous disease Musculoskeletal: no muscle wasting or atrophy  Neurologic: A&O X 3; Appropriate Affect  ; SENSATION: normal; MOTOR FUNCTION:  moving all extremities equally. Speech is fluent/normal  CBC    Component Value Date/Time   WBC 7.8 07/26/2018 0433   RBC 4.29 07/26/2018 0433   HGB 11.5 (L) 07/26/2018 0433   HCT 35.3 (L) 07/26/2018 0433   PLT 321 07/26/2018 0433   MCV 82.3 07/26/2018 0433   MCH 26.8 07/26/2018 0433   MCHC 32.6 07/26/2018 0433   RDW 14.9 07/26/2018 0433   LYMPHSABS 1.8 07/26/2018 0009   MONOABS 0.7 07/26/2018 0009   EOSABS 0.8 (H) 07/26/2018 0009   BASOSABS 0.1 07/26/2018 0009    BMET    Component Value Date/Time   NA 140 07/26/2018 0433   K 3.4 (L) 07/26/2018 0433   CL 107 07/26/2018 0433   CO2 21 (L) 07/26/2018 0433   GLUCOSE 226 (H) 07/26/2018 0433   BUN 51 (H) 07/26/2018 0433   CREATININE 2.43 (H) 07/26/2018 0433   CALCIUM 9.0 07/26/2018 0433   GFRNONAA 18 (L) 07/26/2018 0433   GFRAA 21 (L) 07/26/2018 0433    COAGS: No results found for: INR, PROTIME   Non-Invasive Vascular Imaging:      ASSESSMENT/PLAN: This is a 72 y.o. 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  This point her ABIs are 0.5 on the right and 0.4 on the left with significantly decreased toe pressures.  She will need angiogram to evaluate her right lower extremity and given her chronic kidney issues this will require CO2 and we will only be able to evaluate one leg at a time.  If her cellulitic issues resolve she could be discharged and this could be performed as an outpatient although I would probably keep her overnight for hydration post procedure.  If she continues to have issues we can transfer her this weekend and plan angiogram early next week.  I discussed this with the patient and she demonstrates good understanding.  Dhiren Azimi C. Donzetta Matters, MD [facility] Office: [phone] Pager: [phone]

## 2018-07-26 NOTE — H&P (Addendum)
History and Physical    [name]:[id number] DOB: [birth year] DOA: 07/25/2018  PCP: [name], MD  Patient coming from: Home.  Chief Complaint: Pain and swelling right lower extremity.  HPI: [name] is a 72 y.o. female with history of chronic kidney disease stage IV-V, gout, diabetes mellitus, hypertension, anemia, CAD status post stenting presents to the ER because of worsening pain in the right lower extremity involving the right foot and lower part of her right leg.  Has been ongoing for last few days.  Had a similar episode last month and was treated for cellulitis by primary care physician.  Patient states she took antibiotics for about a week and symptoms improved at the time.  Patient noted that this time that patient's pain has been increasing on trying to walk and decided to come to the ER because of difficulty walking and pain.  Denies any trauma or insect bite.  Has noticed discoloration of the right lower extremity when compared to the left.  ED Course: On exam patient is warm to touch right lower extremity involving the foot and the lower part of the leg.  There is clear skin discoloration involving the right lower extremity.  Patient was started on empiric antibiotics for cellulitis and admitted for further management.  Review of Systems: As per HPI, rest all negative.   Past Medical History:  Diagnosis Date  . Anemia, iron deficiency   . Aortic atherosclerosis ([place]) 11/17/2017  . Arthritis   . CAD (coronary artery disease)   . Carotid stenosis    Carotid US (02/2014):  Bilateral ICA 40-59%; > 50% L ECA; F/u 1 year  . Cataract    removed both eyes  . Cholelithiasis 11/17/2017  . Chronic kidney disease    chronic renal failure  . DM2 (diabetes mellitus, type 2) ([place])   . GERD (gastroesophageal reflux disease)   . Glaucoma   . HLD (hyperlipidemia)   . HTN (hypertension)   . Hx of cardiovascular stress test    Lexiscan Myoview (03/24/14):  No  ischemia, EF 73%, Normal  . Hypoglycemia 06/27/2017  . Mitral regurgitation   . Osteopenia 07/21/2017  . Osteoporosis   . PUD (peptic ulcer disease)   . PVD (peripheral vascular disease) ([place])   . Shortness of breath    with exertion    Past Surgical History:  Procedure Laterality Date  . ABDOMINAL HYSTERECTOMY    . APPENDECTOMY    . AV FISTULA PLACEMENT Right 01/18/2013   Procedure: ARTERIOVENOUS (AV) FISTULA CREATION;  Surgeon: Rosetta Posner, MD;  Location: [facility];  Service: Vascular;  Laterality: Right;  Ultrasound guided  . COLONOSCOPY    . CORONARY ANGIOPLASTY WITH STENT PLACEMENT    . RENAL ANGIOGRAM N/A 09/16/2011   Procedure: RENAL ANGIOGRAM;  Surgeon: Sherren Mocha, MD;  Location: [facility] CATH LAB;  Service: Cardiovascular;  Laterality: N/A;     reports that she quit smoking about 10 years ago. She has never used smokeless tobacco. She reports that she does not drink alcohol or use drugs.  No Known Allergies  Family History  Problem Relation Age of Onset  . Heart disease Father   . Glaucoma Father   . Diabetes Sister   . Diabetes Son   . Colon cancer Neg Hx   . Colon polyps Neg Hx   . Esophageal cancer Neg Hx   . Rectal cancer Neg Hx   . Stomach cancer Neg Hx     Prior  to Admission medications   Medication Sig Start Date End Date Taking? Authorizing Provider  acetaminophen (TYLENOL) 500 MG tablet Take 1-2 tablets (500-1,000 mg total) by mouth every 8 (eight) hours as needed for mild pain. 06/29/17  Yes Hongalgi, Lenis Dickinson, MD  allopurinol (ZYLOPRIM) 100 MG tablet Take 100 mg by mouth daily.   Yes [provider]  amLODipine (NORVASC) 10 MG tablet TAKE ONE (1) TABLET EACH DAY Patient taking differently: Take 10 mg by mouth daily.  03/05/18  Yes [name], MD  aspirin 81 MG tablet Take 81 mg by mouth daily.   Yes [provider]  atorvastatin (LIPITOR) 80 MG tablet TAKE 1 TABLET (80 MG TOTAL) BY MOUTH DAILY AT 6 PM.* NEED APPT 02/20/18  Yes [name],  MD  calcitRIOL (ROCALTROL) 0.25 MCG capsule Take 0.25 mcg by mouth daily.   Yes [provider]  clopidogrel (PLAVIX) 75 MG tablet TAKE 1 TABLET BY MOUTH EVERY DAY 07/23/18  Yes [name], MD  Cyanocobalamin (B-12 PO) Take 1 tablet by mouth daily.   Yes [provider]  diclofenac sodium (VOLTAREN) 1 % GEL Apply 4 g topically 4 (four) times daily as needed. 06/14/18  Yes [name], MD  ferrous sulfate 325 (65 FE) MG tablet TAKE 1 TABLET (325 MG TOTAL) BY MOUTH 2 (TWO) TIMES DAILY. Patient taking differently: Take 325 mg by mouth 2 (two) times daily with a meal. TAKE 1 TABLET (325 MG TOTAL) BY MOUTH 2 (TWO) TIMES DAILY. 06/18/18  Yes [name], MD  furosemide (LASIX) 80 MG tablet Take 0.5 tablets (40 mg total) by mouth 2 (two) times daily. 06/29/17  Yes Hongalgi, Lenis Dickinson, MD  HUMALOG KWIKPEN 100 UNIT/ML KiwkPen INJECT 0-0.09 MLS (0-9 UNITS TOTAL) INTO THE SKIN 2 TIMES DAILY Patient taking differently: Inject 0-9 Units into the skin 2 (two) times daily.  04/16/18  Yes [name], MD  metoprolol tartrate (LOPRESSOR) 100 MG tablet TAKE 1 TABLET BY MOUTH TWICE A DAY 08/19/17  Yes [name], MD  omeprazole (PRILOSEC) 20 MG capsule TAKE 1 CAPSULE (20 MG TOTAL) BY MOUTH DAILY. Patient taking differently: Take 20 mg by mouth daily.  06/04/18  Yes [name], MD  polyethylene glycol powder [facility]) powder Take 17 gm by mouth daily 12/13/17  Yes [name], MD  Potassium Chloride ER 20 MEQ TBCR Take 20 mEq by mouth daily.  05/05/18  Yes [provider]  risedronate (ACTONEL) 150 MG tablet TAKE 1 TABLET BY MOUTH ONCE A MONTH-TAKE ON THE SAME DAY OF EACH MONTH. Patient taking differently: Take 150 mg by mouth every 30 (thirty) days. First Tuesday 05/16/18  Yes [name], MD  Tetrahydrozoline HCl (VISINE OP) Place 2 drops into both eyes daily as needed (DRY EYE).   Yes [provider]  thiamine (VITAMIN B-1) 50 MG tablet Take 1 tablet (50 mg total)  by mouth daily. 11/27/17  Yes Janith Lima, MD  timolol (BETIMOL) 0.5 % ophthalmic solution Place 1 drop into both eyes daily.    Yes [provider]  tiZANidine (ZANAFLEX) 4 MG tablet TAKE 1 TABLET (4 MG TOTAL) BY MOUTH EVERY 6 (SIX) HOURS AS NEEDED FOR MUSCLE SPASMS. 12/13/17  Yes [name], MD  traMADol (ULTRAM) 50 MG tablet TAKE 1 TABLET BY MOUTH EVERY 6 HOURS AS NEEDED 07/18/18  Yes [name], MD  zolpidem (AMBIEN) 5 MG tablet Take 1 tablet (5 mg total) by  mouth at bedtime as needed for sleep. 02/14/18  Yes [name], MD  BD PEN NEEDLE NANO U/F 32G X 4 MM MISC USE DAILY WITH LANTUS SOLASTAR 05/20/14   [name], MD  blood glucose meter kit and supplies KIT Dispense based on patient and insurance preference. Use up to three times daily as directed. (FOR ICD-9 250.00, 250.01). 04/06/16   [name], MD  glucose blood test strip [facility] - Use as instructed three time daily  E11.9 12/13/17   [name], MD  Insulin Glargine (LANTUS SOLOSTAR) 100 UNIT/ML Solostar Pen Inject 10 Units into the skin daily. Patient not taking: Reported on 07/25/2018 06/14/18   [name], MD  Lancets MISC Use as directed three times daily with meals 03/09/17   [name], MD  nitroGLYCERIN (NITROSTAT) 0.4 MG SL tablet Place 1 tablet (0.4 mg total) under the tongue every 5 (five) minutes as needed for chest pain. Reported on 04/06/2016 12/04/17   Sherren Mocha, MD    Physical Exam: Vitals:   07/25/18 1830 07/25/18 1831 07/25/18 2025 07/26/18 0000  BP:  (!) 150/79 (!) 163/77 (!) 178/74  Pulse:  81 62 69  Resp:  _0 Temp:  98 F (36.7 C)    TempSrc:  Oral    SpO2:  96% 100% 97%  Weight: 95.5 kg     Height: _1  (1.549 m)         Constitutional: Moderately built and nourished. Vitals:   07/25/18 1830 07/25/18 1831 07/25/18 2025 07/26/18 0000  BP:  (!) 150/79 (!) 163/77 (!) 178/74  Pulse:  81 62 69  Resp:  _2 Temp:  98 F (36.7 C)    TempSrc:  Oral    SpO2:   96% 100% 97%  Weight: 95.5 kg     Height: _3  (1.549 m)      Eyes: Anicteric no pallor. ENMT: No discharge from the ears eyes nose or mouth. Neck: No mass or.  No neck rigidity. Respiratory: No rhonchi or crepitations. Cardiovascular: S1-S2 heard no murmurs appreciated. Abdomen: Soft nontender bowel sounds present. Musculoskeletal: Swelling involving the right lower extremity from the foot up to the mid calf.  Warm to touch.  No acute ischemic changes. Skin: No rash involving the right lower extremity.  Involves right lower half of the leg and foot. Neurologic: Alert awake oriented to time place and person.  Moves all extremities. Psychiatric: Appears normal.  Normal affect.   Labs on Admission: I have personally reviewed following labs and imaging studies  CBC: Recent Labs  Lab 07/26/18 0009 07/26/18 0016  WBC 10.0  --   NEUTROABS 6.6  --   HGB 12.1 12.2  HCT 35.9* 36.0  MCV 82.2  --   PLT 380  --    Basic Metabolic Panel: Recent Labs  Lab 07/26/18 0016  NA 140  K 4.1  CL 107  GLUCOSE 90  BUN 67*  CREATININE 2.60*   GFR: Estimated Creatinine Clearance: 20 mL/min (A) (by C-G formula based on SCr of 2.6 mg/dL (H)). Liver Function Tests: No results for input(s): AST, ALT, ALKPHOS, BILITOT, PROT, ALBUMIN in the last 168 hours. No results for input(s): LIPASE, AMYLASE in the last 168 hours. No results for input(s): AMMONIA in the last 168 hours. Coagulation Profile: No results for input(s): INR, PROTIME in the last 168 hours. Cardiac Enzymes: No results for input(s): CKTOTAL, CKMB, CKMBINDEX, TROPONINI in the last  168 hours. BNP (last 3 results) No results for input(s): PROBNP in the last 8760 hours. HbA1C: No results for input(s): HGBA1C in the last 72 hours. CBG: No results for input(s): GLUCAP in the last 168 hours. Lipid Profile: No results for input(s): CHOL, HDL, LDLCALC, TRIG, CHOLHDL, LDLDIRECT in the last 72 hours. Thyroid Function Tests: No results  for input(s): TSH, T4TOTAL, FREET4, T3FREE, THYROIDAB in the last 72 hours. Anemia Panel: No results for input(s): VITAMINB12, FOLATE, FERRITIN, TIBC, IRON, RETICCTPCT in the last 72 hours. Urine analysis:    Component Value Date/Time   COLORURINE YELLOW 11/23/2017 1109   APPEARANCEUR [place] (A) 11/23/2017 1109   LABSPEC 1.010 11/23/2017 1109   PHURINE 6.0 11/23/2017 1109   GLUCOSEU NEGATIVE 11/23/2017 1109   [place] 11/23/2017 1109   [place] 11/23/2017 1109   [place] 11/23/2017 1109   PROTEINUR 30 (A) 11/16/2017 1431   UROBILINOGEN 0.2 11/23/2017 1109   NITRITE NEGATIVE 11/23/2017 1109   LEUKOCYTESUR SMALL (A) 11/23/2017 1109   Sepsis Labs: _0 (procalcitonin:4,lacticidven:4) )No results found for this or any previous visit (from the past 240 hour(s)).   Radiological Exams on Admission: No results found.    Assessment/Plan Active Problems:   ANEMIA-IRON DEFICIENCY   Essential hypertension   PERIPHERAL VASCULAR DISEASE   Diabetes mellitus with complication (HCC)   Chronic kidney disease (CKD), stage IV (severe) (HCC)   Cellulitis of both lower extremities   Cellulitis    1. Right lower extremity cellulitis -patient placed on empiric antibiotics.  Given the acute pain will check uric acid and also x-rays and ABI and Dopplers. 2. Diabetes mellitus type 2 -it looks like patient has been discontinued of Lantus by her primary care physician recently.  We will keep patient on sliding scale coverage and closely follow CBGs. 3. Hypertension on amlodipine and metoprolol. 4. History of CAD status post stenting on aspirin Plavix statins and metoprolol. 5. Chronic kidney disease stage IV-V -follow metabolic panel patient is on large doses of Lasix. 6. Anemia likely from renal disease on iron supplements. 7. History of gout on allopurinol.   DVT prophylaxis: Heparin. Code Status: Full code. Family Communication: Discussed with  patient. Disposition Plan: Home. Consults called: None. Admission status: Observation.   [name] MD [facility] Pager [phone].  If 7PM-7AM, please contact night-coverage [email or web address] Password [email or web address]  07/26/2018, 12:54 AM

## 2018-07-27 ENCOUNTER — Telehealth: Payer: Self-pay | Admitting: Vascular Surgery

## 2018-07-27 DIAGNOSIS — I739 Peripheral vascular disease, unspecified: Secondary | ICD-10-CM

## 2018-07-27 DIAGNOSIS — I10 Essential (primary) hypertension: Secondary | ICD-10-CM | POA: Diagnosis not present

## 2018-07-27 DIAGNOSIS — N184 Chronic kidney disease, stage 4 (severe): Secondary | ICD-10-CM | POA: Diagnosis not present

## 2018-07-27 DIAGNOSIS — L03115 Cellulitis of right lower limb: Secondary | ICD-10-CM | POA: Diagnosis not present

## 2018-07-27 DIAGNOSIS — E118 Type 2 diabetes mellitus with unspecified complications: Secondary | ICD-10-CM | POA: Diagnosis not present

## 2018-07-27 LAB — BASIC METABOLIC PANEL
Anion gap: 13 (ref 5–15)
BUN: 60 mg/dL — ABNORMAL HIGH (ref 8–23)
CALCIUM: 8.9 mg/dL (ref 8.9–10.3)
CHLORIDE: 106 mmol/L (ref 98–111)
CO2: 21 mmol/L — ABNORMAL LOW (ref 22–32)
CREATININE: 3.09 mg/dL — AB (ref 0.44–1.00)
GFR, EST AFRICAN AMERICAN: 16 mL/min — AB (ref >60)
GFR, EST NON AFRICAN AMERICAN: 14 mL/min — AB (ref >60)
Glucose, Bld: 237 mg/dL — ABNORMAL HIGH (ref 70–99)
Potassium: 3.7 mmol/L (ref 3.5–5.1)
SODIUM: 140 mmol/L (ref 135–145)

## 2018-07-27 LAB — GLUCOSE, CAPILLARY
GLUCOSE-CAPILLARY: 228 mg/dL — AB (ref 70–99)
GLUCOSE-CAPILLARY: 273 mg/dL — AB (ref 70–99)

## 2018-07-27 MED ORDER — HYDROXYZINE HCL 10 MG PO TABS: 10.0000 mg | ORAL_TABLET | Freq: Three times a day (TID) | ORAL | 0 refills | Status: AC | PRN

## 2018-07-27 MED ORDER — INSULIN GLARGINE 100 UNIT/ML ~~LOC~~ SOLN
10.0000 [IU] | Freq: Every day | SUBCUTANEOUS | Status: DC
  Filled 2018-07-27: qty 0.1

## 2018-07-27 MED ORDER — DOXYCYCLINE HYCLATE 100 MG PO CAPS: 100.0000 mg | ORAL_CAPSULE | Freq: Two times a day (BID) | ORAL | 0 refills | Status: DC

## 2018-07-27 MED ORDER — HYDROXYZINE HCL 10 MG PO TABS
10.0000 mg | ORAL_TABLET | Freq: Three times a day (TID) | ORAL | Status: DC | PRN
  Administered 2018-07-27: 10 mg via ORAL
  Filled 2018-07-27 (×2): qty 1

## 2018-07-27 MED ORDER — SODIUM CHLORIDE 0.9 % IV SOLN: INTRAVENOUS | Status: DC | PRN

## 2018-07-27 NOTE — Care Management Note (Signed)
Case Management Note  Patient Details  Name: [name] MRN: [id number] Date of Birth: 05/31/1946  Subjective/Objective:    Spoke with patient at bedside. States she lives at home with her daughter. Her daughter assist with meals and transportation. She ambulates with a cane. She has a PCP. No issues taking or getting meds. Her daughter will pick her up for d/c.                 Action/Plan: No needs.  Expected Discharge Date:  07/27/18               Expected Discharge Plan:  Home/Self Care  In-House Referral:  NA  Discharge planning Services  CM Consult  Post Acute Care Choice:  NA Choice offered to:  Patient  DME Arranged:  N/A DME Agency:  NA  HH Arranged:  NA HH Agency:  NA  Status of Service:  Completed, signed off  If discussed at [place] of Stay Meetings, dates discussed:    Additional Comments:  Guadalupe Maple, RN 07/27/2018, 2:07 PM

## 2018-07-27 NOTE — Telephone Encounter (Signed)
appt spk to pt 08/10/18 3pm f/u MD

## 2018-07-27 NOTE — Care Management Obs Status (Signed)
[place] NOTIFICATION   Patient Details  Name: MAKYNZI EASTLAND MRN: [id number] Date of Birth: July 09, 1946   Medicare Observation Status Notification Given:  Yes    Guadalupe Maple, RN 07/27/2018, 2:06 PM

## 2018-07-27 NOTE — Discharge Summary (Addendum)
Physician Discharge Summary  [name]:[id number] DOB: 20-Mar-1946 DOA: 07/25/2018  PCP: Ashley Borg, MD  Admit date: 07/25/2018 Discharge date: 07/27/2018  Time spent: *25 minutes  Recommendations for Outpatient Follow-up:  1. Hold Lasix and potassium for 2 more days.  Start taking on 07/30/2018 2. We need to repeat BMP in 1 week 3. Follow-up vascular surgery in 1 week 4. Follow-up PCP in 1 week   Discharge Diagnoses:  Active Problems:   ANEMIA-IRON DEFICIENCY   Essential hypertension   PERIPHERAL VASCULAR DISEASE   Diabetes mellitus with complication (HCC)   Chronic kidney disease (CKD), stage IV (severe) (HCC)   Cellulitis of both lower extremities   Cellulitis   Discharge Condition: Stable  Diet recommendation: Carb modified diet  Filed Weights   07/25/18 1830 07/26/18 0156 07/26/18 0525  Weight: 95.5 kg 94.5 kg 91.2 kg    History of present illness:  72 y.o. female with history of chronic kidney disease stage IV-V, gout, diabetes mellitus, hypertension, anemia, CAD status post stenting presents to the ER because of worsening pain in the right lower extremity involving the right foot and lower part of her right leg.  Has been ongoing for last few days.  Had a similar episode last month and was treated for cellulitis by primary care physician.  Patient states she took antibiotics for about a week and symptoms improved at the time.  Patient noted that this time that patient's pain has been increasing on trying to walk and decided to come to the ER because of difficulty walking and pain.  Denies any trauma or insect bite.  Has noticed discoloration of the right lower extremity when compared to the left.  Hospital Course:  Right lower extremity cellulitis-resolved, patient was started on empiric antibiotics vancomycin and Zosyn.  At this time cellulitis has improved.  Will discharge patient home on doxycycline for 5 more days.  Lower extremity venous Doppler was  negative for DVT  Peripheral arterial disease-ABIs done in the hospital shows moderate arterial insufficiency, right ABI 0.53, left 0.44.  Vascular surgery was consulted.  Dr. Donzetta [name] saw the patient, and recommend follow-up as outpatient for angiogram.  Patient is feeling better, cellulitis has improved and will be discharged home.  She will follow-up with vascular surgery in 1 week.   Acute kidney injury on CKD stage IV-patient baseline creatinine is around 2.7, today creatinine is elevated 3.09.  I have recommended to hold Lasix for 2 days and start taking from Monday.  She will need to have BMP checked in 1 week.  History of CAD-status post stenting on aspirin and Plavix, metoprolol.  History of gout-continue allopurinol  Diabetes mellitus-blood glucose is elevated, continue taking Humalog at home.  Patient says that she stopped taking Lantus after her blood glucose had dropped significantly.  She is to follow-up with her PCP for further adjustment of medications.   Discussed with patient, recommendation by vascular surgery regarding angiogram as outpatient versus inpatient.  Patient is feeling much better, cellulitis has almost resolved.  She wants to go home and come back for angiogram as outpatient.  Procedures:  None  Consultations:  Vascular surgery  Discharge Exam: Vitals:   07/27/18 0609 07/27/18 1033  BP: (!) 157/69 (!) 158/65  Pulse: 67 62  Resp: 17   Temp: 97.7 F (36.5 C)   SpO2: 95%     General: Appears in no acute distress  Cardiovascular: S1-S2, regular Respiratory: Clear to auscultation bilaterally Extremities-erythema has significantly improved in the  right lower extremity.   Allergies as of 07/27/2018   No Known Allergies     Medication List    TAKE these medications   acetaminophen 500 MG tablet Commonly known as:  TYLENOL Take 1-2 tablets (500-1,000 mg total) by mouth every 8 (eight) hours as needed for mild pain.   allopurinol 100 MG  tablet Commonly known as:  ZYLOPRIM Take 100 mg by mouth daily.   amLODipine 10 MG tablet Commonly known as:  NORVASC TAKE ONE (1) TABLET EACH DAY What changed:  See the new instructions.   aspirin 81 MG tablet Take 81 mg by mouth daily.   atorvastatin 80 MG tablet Commonly known as:  LIPITOR TAKE 1 TABLET (80 MG TOTAL) BY MOUTH DAILY AT 6 PM.* NEED APPT   B-12 PO Take 1 tablet by mouth daily.   BD PEN NEEDLE NANO U/F 32G X 4 MM Misc Generic drug:  Insulin Pen Needle USE DAILY WITH LANTUS SOLASTAR   blood glucose meter kit and supplies Kit Dispense based on patient and insurance preference. Use up to three times daily as directed. (FOR ICD-9 250.00, 250.01).   calcitRIOL 0.25 MCG capsule Commonly known as:  ROCALTROL Take 0.25 mcg by mouth daily.   clopidogrel 75 MG tablet Commonly known as:  PLAVIX TAKE 1 TABLET BY MOUTH EVERY DAY   diclofenac sodium 1 % Gel Commonly known as:  VOLTAREN Apply 4 g topically 4 (four) times daily as needed.   doxycycline 100 MG capsule Commonly known as:  VIBRAMYCIN Take 1 capsule (100 mg total) by mouth 2 (two) times daily.   ferrous sulfate 325 (65 FE) MG tablet TAKE 1 TABLET (325 MG TOTAL) BY MOUTH 2 (TWO) TIMES DAILY. What changed:  See the new instructions.   furosemide 80 MG tablet Commonly known as:  LASIX Take 0.5 tablets (40 mg total) by mouth 2 (two) times daily.   glucose blood test strip ACCU CHECK - Use as instructed three time daily  E11.9   HUMALOG KWIKPEN 100 UNIT/ML KiwkPen Generic drug:  insulin lispro INJECT 0-0.09 MLS (0-9 UNITS TOTAL) INTO THE SKIN 2 TIMES DAILY What changed:  See the new instructions.   hydrOXYzine 10 MG tablet Commonly known as:  ATARAX/VISTARIL Take 1 tablet (10 mg total) by mouth 3 (three) times daily as needed for itching.   Insulin Glargine 100 UNIT/ML Solostar Pen Commonly known as:  LANTUS Inject 10 Units into the skin daily.   Lancets Misc Use as directed three times daily  with meals   metoprolol tartrate 100 MG tablet Commonly known as:  LOPRESSOR TAKE 1 TABLET BY MOUTH TWICE A DAY   nitroGLYCERIN 0.4 MG SL tablet Commonly known as:  NITROSTAT Place 1 tablet (0.4 mg total) under the tongue every 5 (five) minutes as needed for chest pain. Reported on 04/06/2016   omeprazole 20 MG capsule Commonly known as:  PRILOSEC TAKE 1 CAPSULE (20 MG TOTAL) BY MOUTH DAILY. What changed:  See the new instructions.   polyethylene glycol powder powder Commonly known as:  GLYCOLAX/MIRALAX Take 17 gm by mouth daily   Potassium Chloride ER 20 MEQ Tbcr Take 20 mEq by mouth daily.   risedronate 150 MG tablet Commonly known as:  ACTONEL TAKE 1 TABLET BY MOUTH ONCE A MONTH-TAKE ON THE SAME DAY OF EACH MONTH. What changed:  See the new instructions.   thiamine 50 MG tablet Commonly known as:  VITAMIN B-1 Take 1 tablet (50 mg total) by mouth daily.  timolol 0.5 % ophthalmic solution Commonly known as:  BETIMOL Place 1 drop into both eyes daily.   tiZANidine 4 MG tablet Commonly known as:  ZANAFLEX TAKE 1 TABLET (4 MG TOTAL) BY MOUTH EVERY 6 (SIX) HOURS AS NEEDED FOR MUSCLE SPASMS.   traMADol 50 MG tablet Commonly known as:  ULTRAM TAKE 1 TABLET BY MOUTH EVERY 6 HOURS AS NEEDED   VISINE OP Place 2 drops into both eyes daily as needed (DRY EYE).   zolpidem 5 MG tablet Commonly known as:  AMBIEN Take 1 tablet (5 mg total) by mouth at bedtime as needed for sleep.       Discharge Instructions   Discharge Instructions    Diet - low sodium heart healthy   Complete by:  As directed    Discharge instructions   Complete by:  As directed    Do not take Lasix, and potassium pills for 2 days.  Start taking from Monday, 07/30/2018 Need to check BMP in 1 week   Increase activity slowly   Complete by:  As directed       No Known Allergies Follow-up Information    Ashley Borg, MD Follow up in 1 week(s).   Specialties:  Internal Medicine, Radiology Why:   Check BMP in 1 week Contact information: [place] [address] [phone]        Waynetta Sandy, MD. Schedule an appointment as soon as possible for a visit in 1 week(s).   Specialties:  Vascular Surgery, Cardiology Contact information: [address] [place] [address] [phone]            The results of significant diagnostics from this hospitalization (including imaging, microbiology, ancillary and laboratory) are listed below for reference.    Significant Diagnostic Studies: Dg Ankle 2 Views Right  Result Date: 07/26/2018 CLINICAL DATA:  Right ankle pain.  Redness and swelling. EXAM: RIGHT ANKLE - 2 VIEW COMPARISON:  None. FINDINGS: There is no evidence of fracture, dislocation, or joint effusion. The ankle mortise is preserved. Tiny plantar calcaneal spur. Diffuse soft tissue edema about the ankle. No bony destructive change. Dense vascular calcifications. No soft tissue air or radiopaque foreign body. IMPRESSION: Diffuse soft tissue edema without acute osseous abnormality. Electronically Signed   By: Keith Rake M.D.   On: 07/26/2018 05:17    Microbiology: Recent Results (from the past 240 hour(s))  Blood culture (routine x 2)     Status: None (Preliminary result)   Collection Time: 07/26/18 12:09 AM  Result Value Ref Range Status   Specimen Description   Final    BLOOD LEFT HAND Performed at [facility] [address] [place], [place] [address]    Special Requests   Final    BOTTLES DRAWN AEROBIC ONLY Blood Culture adequate volume Performed at [facility] [address], [place], [place] [address]    Culture   Final    NO GROWTH 1 DAY Performed at [facility], [place] [address], [place], [place] [address]    Report Status PENDING  Incomplete  Blood culture (routine x 2)     Status: None (Preliminary result)   Collection Time: 07/26/18 12:09 AM  Result Value Ref Range  Status   Specimen Description   Final    BLOOD LEFT FOREARM Performed at [facility], [place] [address] [place], [place] [address]    Special Requests   Final    BOTTLES  DRAWN AEROBIC ONLY Blood Culture adequate volume Performed at [facility] [address], [place], [place] [address]    Culture   Final    NO GROWTH 1 DAY Performed at [facility], [place] [address] [place], [place] [address]    Report Status PENDING  Incomplete     Labs: Basic Metabolic Panel: Recent Labs  Lab 07/26/18 0016 07/26/18 0433 07/27/18 0508  NA 140 140 140  K 4.1 3.4* 3.7  CL 107 107 106  CO2  --  21* 21*  GLUCOSE 90 226* 237*  BUN 67* 51* 60*  CREATININE 2.60* 2.43* 3.09*  CALCIUM  --  9.0 8.9   Liver Function Tests: No results for input(s): AST, ALT, ALKPHOS, BILITOT, PROT, ALBUMIN in the last 168 hours. No results for input(s): LIPASE, AMYLASE in the last 168 hours. No results for input(s): AMMONIA in the last 168 hours. CBC: Recent Labs  Lab 07/26/18 0009 07/26/18 0016 07/26/18 0433  WBC 10.0  --  7.8  NEUTROABS 6.6  --   --   HGB 12.1 12.2 11.5*  HCT 35.9* 36.0 35.3*  MCV 82.2  --  82.3  PLT 380  --  321    CBG: Recent Labs  Lab 07/26/18 1219 07/26/18 1638 07/26/18 2215 07/27/18 0723 07/27/18 1200  GLUCAP 251* 165* 239* 228* 273*       Signed:  Oswald Hillock MD.  [facility] 07/27/2018, 2:18 PM

## 2018-07-31 LAB — CULTURE, BLOOD (ROUTINE X 2)
CULTURE: NO GROWTH
Culture: NO GROWTH
Special Requests: ADEQUATE
Special Requests: ADEQUATE

## 2018-08-01 ENCOUNTER — Ambulatory Visit: Payer: Medicare Other | Admitting: Cardiovascular Disease

## 2018-08-02 ENCOUNTER — Other Ambulatory Visit (INDEPENDENT_AMBULATORY_CARE_PROVIDER_SITE_OTHER): Payer: Medicare Other

## 2018-08-02 ENCOUNTER — Encounter (HOSPITAL_COMMUNITY): Payer: Medicare Other

## 2018-08-02 ENCOUNTER — Ambulatory Visit (INDEPENDENT_AMBULATORY_CARE_PROVIDER_SITE_OTHER): Payer: Medicare Other | Admitting: Internal Medicine

## 2018-08-02 ENCOUNTER — Encounter: Payer: Self-pay | Admitting: Internal Medicine

## 2018-08-02 VITALS — BP 122/76 | HR 70 | Temp 97.7°F | Ht 61.0 in | Wt 211.0 lb

## 2018-08-02 DIAGNOSIS — E118 Type 2 diabetes mellitus with unspecified complications: Secondary | ICD-10-CM

## 2018-08-02 DIAGNOSIS — Z23 Encounter for immunization: Secondary | ICD-10-CM

## 2018-08-02 DIAGNOSIS — L03115 Cellulitis of right lower limb: Secondary | ICD-10-CM

## 2018-08-02 DIAGNOSIS — R269 Unspecified abnormalities of gait and mobility: Secondary | ICD-10-CM

## 2018-08-02 DIAGNOSIS — I10 Essential (primary) hypertension: Secondary | ICD-10-CM

## 2018-08-02 LAB — BASIC METABOLIC PANEL
BUN: 50 mg/dL — ABNORMAL HIGH (ref 6–23)
CALCIUM: 9.4 mg/dL (ref 8.4–10.5)
CHLORIDE: 106 meq/L (ref 96–112)
CO2: 22 mEq/L (ref 19–32)
CREATININE: 2.21 mg/dL — AB (ref 0.40–1.20)
GFR: 27.9 mL/min — ABNORMAL LOW (ref >60.00)
Glucose, Bld: 255 mg/dL — ABNORMAL HIGH (ref 70–99)
Potassium: 4.3 mEq/L (ref 3.5–5.1)
SODIUM: 136 meq/L (ref 135–145)

## 2018-08-02 MED ORDER — CLINDAMYCIN HCL 300 MG PO CAPS: 300.0000 mg | ORAL_CAPSULE | Freq: Three times a day (TID) | ORAL | 0 refills | Status: DC

## 2018-08-02 NOTE — Patient Instructions (Signed)
Ok to change antibiotic to Cleocin 300 mg three times per day  You are given the Prescription for the [employer] (Rollater)  Please continue all other medications as before, including the tramadol for pain  Please keep your appointments with your specialists as you may have planned - Aug 10, 2018 Vascular  Please go to the LAB in the Basement (turn left off the elevator) for the tests to be done today  You will be contacted by phone if any changes need to be made immediately.  Otherwise, you will receive a letter about your results with an explanation, but please check with MyChart first.  Please remember to sign up for MyChart if you have not done so, as this will be important to you in the future with finding out test results, communicating by private email, and scheduling acute appointments online when needed.

## 2018-08-02 NOTE — Progress Notes (Signed)
Subjective:    Patient ID: [name], female    DOB: Dec 01, 1945, 72 y.o.   MRN: [id number]  HPI  Here to f/u recent hospn 10/2 - 10/4 with RLE cellulitis, tx with IV vanc and zosyn with much improvement, d/c on doxy course with finish up today, but RLE today still seems to be red, mild tender, swelling diffusely below the knee to the ankle, but without specific abscess or drainage or red streaks.  No fever, but c/o fatigue more than usual.  Pt denies chest pain, increased sob or doe, wheezing, orthopnea, PND, increased LE swelling, palpitations, dizziness or syncope.   Pt denies polydipsia, polyuria, Denies worsening reflux, abd pain, dysphagia, n/v, bowel change or blood. And taking po "good enough" per pt stay avoid dehydration and low K also treated at hospn.  Daughter with her asks for Rollater walker as she continues to be unsteady, maybe a bit worse than when left the hospital but without falls.  Lasix and K had been held for 2 days post hospn, now resumed, and also for f/u labs today.  Plans to see vascular surgury soon Past Medical History:  Diagnosis Date  . Anemia, iron deficiency   . Aortic atherosclerosis ([place]) 11/17/2017  . Arthritis   . CAD (coronary artery disease)   . Carotid stenosis    Carotid US (02/2014):  Bilateral ICA 40-59%; > 50% L ECA; F/u 1 year  . Cataract    removed both eyes  . Cholelithiasis 11/17/2017  . Chronic kidney disease    chronic renal failure  . DM2 (diabetes mellitus, type 2) ([place])   . GERD (gastroesophageal reflux disease)   . Glaucoma   . HLD (hyperlipidemia)   . HTN (hypertension)   . Hx of cardiovascular stress test    Lexiscan Myoview (03/24/14):  No ischemia, EF 73%, Normal  . Hypoglycemia 06/27/2017  . Mitral regurgitation   . Osteopenia 07/21/2017  . Osteoporosis   . PUD (peptic ulcer disease)   . PVD (peripheral vascular disease) ([place])   . Shortness of breath    with exertion   Past Surgical History:  Procedure Laterality Date    . ABDOMINAL HYSTERECTOMY    . APPENDECTOMY    . AV FISTULA PLACEMENT Right 01/18/2013   Procedure: ARTERIOVENOUS (AV) FISTULA CREATION;  Surgeon: Rosetta Posner, MD;  Location: [facility];  Service: Vascular;  Laterality: Right;  Ultrasound guided  . COLONOSCOPY    . CORONARY ANGIOPLASTY WITH STENT PLACEMENT    . RENAL ANGIOGRAM N/A 09/16/2011   Procedure: RENAL ANGIOGRAM;  Surgeon: Sherren Mocha, MD;  Location: [facility] CATH LAB;  Service: Cardiovascular;  Laterality: N/A;    reports that she quit smoking about 10 years ago. She has never used smokeless tobacco. She reports that she does not drink alcohol or use drugs. family history includes Diabetes in her sister and son; Glaucoma in her father; Heart disease in her father. No Known Allergies Current Outpatient Medications on File Prior to Visit  Medication Sig Dispense Refill  . acetaminophen (TYLENOL) 500 MG tablet Take 1-2 tablets (500-1,000 mg total) by mouth every 8 (eight) hours as needed for mild pain.    Marland Kitchen allopurinol (ZYLOPRIM) 100 MG tablet Take 100 mg by mouth daily.    Marland Kitchen amLODipine (NORVASC) 10 MG tablet TAKE ONE (1) TABLET EACH DAY (Patient taking differently: Take 10 mg by mouth daily. ) 90 tablet 2  . aspirin 81 MG tablet Take 81 mg by mouth daily.    Marland Kitchen  BD PEN NEEDLE NANO U/F 32G X 4 MM MISC USE DAILY WITH LANTUS SOLASTAR 100 each 11  . blood glucose meter kit and supplies KIT Dispense based on patient and insurance preference. Use up to three times daily as directed. (FOR ICD-9 250.00, 250.01). 1 each 0  . calcitRIOL (ROCALTROL) 0.25 MCG capsule Take 0.25 mcg by mouth daily.    . clopidogrel (PLAVIX) 75 MG tablet TAKE 1 TABLET BY MOUTH EVERY DAY 90 tablet 1  . Cyanocobalamin (B-12 PO) Take 1 tablet by mouth daily.    . diclofenac sodium (VOLTAREN) 1 % GEL Apply 4 g topically 4 (four) times daily as needed. 400 g 11  . ferrous sulfate 325 (65 FE) MG tablet TAKE 1 TABLET (325 MG TOTAL) BY MOUTH 2 (TWO) TIMES DAILY. (Patient taking  differently: Take 325 mg by mouth 2 (two) times daily with a meal. TAKE 1 TABLET (325 MG TOTAL) BY MOUTH 2 (TWO) TIMES DAILY.) 180 tablet 1  . furosemide (LASIX) 80 MG tablet Take 0.5 tablets (40 mg total) by mouth 2 (two) times daily.    Marland Kitchen glucose blood test strip ACCU CHECK - Use as instructed three time daily  E11.9 300 each 12  . HUMALOG KWIKPEN 100 UNIT/ML KiwkPen INJECT 0-0.09 MLS (0-9 UNITS TOTAL) INTO THE SKIN 2 TIMES DAILY (Patient taking differently: Inject 0-9 Units into the skin 2 (two) times daily. ) 15 pen 1  . hydrOXYzine (ATARAX/VISTARIL) 10 MG tablet Take 1 tablet (10 mg total) by mouth 3 (three) times daily as needed for itching. 30 tablet 0  . Insulin Glargine (LANTUS SOLOSTAR) 100 UNIT/ML Solostar Pen Inject 10 Units into the skin daily. 5 pen 11  . Lancets MISC Use as directed three times daily with meals 100 each 3  . metoprolol tartrate (LOPRESSOR) 100 MG tablet TAKE 1 TABLET BY MOUTH TWICE A DAY 180 tablet 3  . nitroGLYCERIN (NITROSTAT) 0.4 MG SL tablet Place 1 tablet (0.4 mg total) under the tongue every 5 (five) minutes as needed for chest pain. Reported on 04/06/2016 25 tablet 5  . omeprazole (PRILOSEC) 20 MG capsule TAKE 1 CAPSULE (20 MG TOTAL) BY MOUTH DAILY. (Patient taking differently: Take 20 mg by mouth daily. ) 90 capsule 0  . polyethylene glycol powder (GLYCOLAX/MIRALAX) powder Take 17 gm by mouth daily 3350 g 11  . Potassium Chloride ER 20 MEQ TBCR Take 20 mEq by mouth daily.   6  . risedronate (ACTONEL) 150 MG tablet TAKE 1 TABLET BY MOUTH ONCE A MONTH-TAKE ON THE SAME DAY OF EACH MONTH. (Patient taking differently: Take 150 mg by mouth every 30 (thirty) days. First Tuesday) 3 tablet 1  . Tetrahydrozoline HCl (VISINE OP) Place 2 drops into both eyes daily as needed (DRY EYE).    Marland Kitchen thiamine (VITAMIN B-1) 50 MG tablet Take 1 tablet (50 mg total) by mouth daily. 90 tablet 1  . timolol (BETIMOL) 0.5 % ophthalmic solution Place 1 drop into both eyes daily.     Marland Kitchen  tiZANidine (ZANAFLEX) 4 MG tablet TAKE 1 TABLET (4 MG TOTAL) BY MOUTH EVERY 6 (SIX) HOURS AS NEEDED FOR MUSCLE SPASMS. 60 tablet 1  . traMADol (ULTRAM) 50 MG tablet TAKE 1 TABLET BY MOUTH EVERY 6 HOURS AS NEEDED 30 tablet 2  . zolpidem (AMBIEN) 5 MG tablet Take 1 tablet (5 mg total) by mouth at bedtime as needed for sleep. 30 tablet 5   No current facility-administered medications on file prior to visit.  Review of Systems  Constitutional: Negative for other unusual diaphoresis or sweats HENT: Negative for ear discharge or swelling Eyes: Negative for other worsening visual disturbances Respiratory: Negative for stridor or other swelling  Gastrointestinal: Negative for worsening distension or other blood Genitourinary: Negative for retention or other urinary change Musculoskeletal: Negative for other MSK pain or swelling Skin: Negative for color change or other new lesions Neurological: Negative for worsening tremors and other numbness  Psychiatric/Behavioral: Negative for worsening agitation or other fatigue All other system neg per pt    Objective:   Physical Exam  BP 122/76   Pulse 70   Temp 97.7 F (36.5 C) (Oral)   Ht '5\' 1"'$  (1.549 m)   Wt 211 lb (95.7 kg)   SpO2 96%   BMI 39.87 kg/m  VS noted,  Constitutional: Pt appears in NAD HENT: Head: NCAT.  Right Ear: External ear normal.  Left Ear: External ear normal.  Eyes: . Pupils are equal, round, and reactive to light. Conjunctivae and EOM are normal Nose: without d/c or deformity Neck: Neck supple. Gross normal ROM Cardiovascular: Normal rate and regular rhythm.   Pulmonary/Chest: Effort normal and breath sounds without rales or wheezing.  Abd:  Soft, NT, ND, + BS, no organomegaly Neurological: Pt is alert. At baseline orientation, motor grossly intact Skin: Skin is warmer than usual with trace to 1+ RLE swelling below the knee to the ankle mostly pretibial and tender very mild erythema on chronic skin change, without  abscess or drainage Psychiatric: Pt behavior is normal without agitation  No other exam findings  Lab Results  Component Value Date   WBC 7.8 07/26/2018   HGB 11.5 (L) 07/26/2018   HCT 35.3 (L) 07/26/2018   PLT 321 07/26/2018   GLUCOSE 255 (H) 08/02/2018   CHOL 150 06/14/2018   TRIG 103.0 06/14/2018   HDL 48.30 06/14/2018   LDLDIRECT 105.0 06/08/2017   LDLCALC 81 06/14/2018   ALT 15 06/14/2018   AST 11 06/14/2018   NA 136 08/02/2018   K 4.3 08/02/2018   CL 106 08/02/2018   CREATININE 2.21 (H) 08/02/2018   BUN 50 (H) 08/02/2018   CO2 22 08/02/2018   TSH 3.28 06/08/2017   HGBA1C 7.9 (H) 06/14/2018   MICROALBUR 19.3 (H) 11/23/2017       Assessment & Plan:

## 2018-08-04 DIAGNOSIS — R269 Unspecified abnormalities of gait and mobility: Secondary | ICD-10-CM | POA: Insufficient documentation

## 2018-08-04 MED ORDER — ATORVASTATIN CALCIUM 80 MG PO TABS: ORAL_TABLET | ORAL | 3 refills | Status: DC

## 2018-08-04 NOTE — Assessment & Plan Note (Signed)
stable overall by history and exam, recent data reviewed with pt, and pt to continue medical treatment as before,  to f/u any worsening symptoms or concerns  

## 2018-08-04 NOTE — Assessment & Plan Note (Addendum)
I suspect still mild active despite hospn with aggressive IV Vanc and zosyn, and then d/c on doxy to which she has been compliant; ok for change tx with cleocin asd, f/u vascular as planned, for f/u BMP as well given recent abnormal K and dehydration; ok for rollater walker rx due to gait difficulty

## 2018-08-04 NOTE — Assessment & Plan Note (Signed)
For rollater as above

## 2018-08-07 ENCOUNTER — Telehealth: Payer: Self-pay

## 2018-08-07 DIAGNOSIS — R269 Unspecified abnormalities of gait and mobility: Secondary | ICD-10-CM

## 2018-08-07 NOTE — Telephone Encounter (Signed)
Copied from [place] [id number]. Topic: General - Other >> Aug 07, 2018  2:22 PM Carolyn Stare wrote:  Daughter said the paperwork need to say  rolling walker with a seat and can be sent to [facility]. The paperwork they were given last week did have the correct wording    Fax number [phone]

## 2018-08-08 NOTE — Addendum Note (Signed)
Addended by: [name] on: 08/08/2018 08:06 AM   Modules accepted: Orders

## 2018-08-08 NOTE — Telephone Encounter (Signed)
See below

## 2018-08-08 NOTE — Telephone Encounter (Signed)
Done hardcopy to shirron 

## 2018-08-08 NOTE — Telephone Encounter (Signed)
Has been faxed to [facility]

## 2018-08-10 ENCOUNTER — Ambulatory Visit: Payer: Medicare Other | Admitting: Vascular Surgery

## 2018-08-10 ENCOUNTER — Other Ambulatory Visit: Payer: Self-pay | Admitting: *Deleted

## 2018-08-10 ENCOUNTER — Encounter: Payer: Self-pay | Admitting: *Deleted

## 2018-08-10 ENCOUNTER — Other Ambulatory Visit: Payer: Self-pay

## 2018-08-10 ENCOUNTER — Other Ambulatory Visit: Payer: Self-pay | Admitting: Internal Medicine

## 2018-08-10 ENCOUNTER — Encounter: Payer: Self-pay | Admitting: Vascular Surgery

## 2018-08-10 VITALS — BP 183/91 | HR 74 | Temp 96.9°F | Resp 16 | Ht 61.0 in | Wt 210.0 lb

## 2018-08-10 DIAGNOSIS — I739 Peripheral vascular disease, unspecified: Secondary | ICD-10-CM

## 2018-08-10 NOTE — Telephone Encounter (Signed)
Spoke with Ankit, pharmacist at [facility] who states that the pt was given 3 pens on 07/03/18 which should last the pt until November if the pt is taking 0-9 units twice a day as current instructions state Pharmacist states medication would not be covered to be refilled by insurance until 09/08/18.  Pt states that she only has one pen left. Attempted to call pt to receive clarification of how medication is being taken currently but no answer. Unable to leave message due to voicemail mailbox not being set up.

## 2018-08-10 NOTE — Progress Notes (Signed)
Patient ID: [name], female   DOB: April 21, 1946, 72 y.o.   MRN: [id number]  Reason for Consult: Follow-up   Referred by [name], MD  Subjective:     HPI:  [name] is a 72 y.o. female with history of chronic renal insufficiency previously had a right upper extremity fistula created that failed.  I recently saw her in the hospital for concern right lower extremity swelling.  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  She now follows up from inpatient after not wanting to have any intervention at that time.  She continues to have pain in her right lower extremity.  She does walk.  No new wounds but continues to have a wound on her right lower extremity.  Past Medical History:  Diagnosis Date  . Anemia, iron deficiency   . Aortic atherosclerosis ([place]) 11/17/2017  . Arthritis   . CAD (coronary artery disease)   . Carotid stenosis    Carotid US (02/2014):  Bilateral ICA 40-59%; > 50% L ECA; F/u 1 year  . Cataract    removed both eyes  . Cholelithiasis 11/17/2017  . Chronic kidney disease    chronic renal failure  . DM2 (diabetes mellitus, type 2) ([place])   . GERD (gastroesophageal reflux disease)   . Glaucoma   . HLD (hyperlipidemia)   . HTN (hypertension)   . Hx of cardiovascular stress test    Lexiscan Myoview (03/24/14):  No ischemia, EF 73%, Normal  . Hypoglycemia 06/27/2017  . Mitral regurgitation   . Osteopenia 07/21/2017  . Osteoporosis   . PUD (peptic ulcer disease)   . PVD (peripheral vascular disease) ([place])   . Shortness of breath    with exertion   Family History  Problem Relation Age of Onset  . Heart disease Father   . Glaucoma Father   . Diabetes Sister   . Diabetes Son   . Colon cancer Neg Hx   . Colon polyps Neg Hx   . Esophageal cancer Neg Hx   . Rectal cancer Neg Hx   . Stomach cancer Neg Hx    Past Surgical History:  Procedure Laterality Date  . ABDOMINAL HYSTERECTOMY    . APPENDECTOMY    . AV FISTULA PLACEMENT Right  01/18/2013   Procedure: ARTERIOVENOUS (AV) FISTULA CREATION;  Surgeon: Rosetta Posner, MD;  Location: [facility];  Service: Vascular;  Laterality: Right;  Ultrasound guided  . COLONOSCOPY    . CORONARY ANGIOPLASTY WITH STENT PLACEMENT    . RENAL ANGIOGRAM N/A 09/16/2011   Procedure: RENAL ANGIOGRAM;  Surgeon: Sherren Mocha, MD;  Location: [facility] CATH LAB;  Service: Cardiovascular;  Laterality: N/A;    Short Social History:  Social History   Tobacco Use  . Smoking status: Former Smoker    Last attempt to quit: 10/25/2007    Years since quitting: 10.8  . Smokeless tobacco: Never Used  . Tobacco comment: Stopped 1.5 yrs ago January 2009  Substance Use Topics  . Alcohol use: No    No Known Allergies  Current Outpatient Medications  Medication Sig Dispense Refill  . acetaminophen (TYLENOL) 500 MG tablet Take 1-2 tablets (500-1,000 mg total) by mouth every 8 (eight) hours as needed for mild pain.    Marland Kitchen allopurinol (ZYLOPRIM) 100 MG tablet Take 100 mg by mouth daily.    Marland Kitchen amLODipine (NORVASC) 10 MG tablet TAKE ONE (1) TABLET EACH DAY (Patient taking differently: Take 10 mg by mouth daily. ) 90  tablet 2  . aspirin 81 MG tablet Take 81 mg by mouth daily.    Marland Kitchen atorvastatin (LIPITOR) 80 MG tablet TAKE 1 TABLET (80 MG TOTAL) BY MOUTH DAILY AT 6 PM. 90 tablet 3  . BD PEN NEEDLE NANO U/F 32G X 4 MM MISC USE DAILY WITH LANTUS SOLASTAR 100 each 11  . blood glucose meter kit and supplies KIT Dispense based on patient and insurance preference. Use up to three times daily as directed. (FOR ICD-9 250.00, 250.01). 1 each 0  . calcitRIOL (ROCALTROL) 0.25 MCG capsule Take 0.25 mcg by mouth daily.    . clindamycin (CLEOCIN) 300 MG capsule Take 1 capsule (300 mg total) by mouth 3 (three) times daily. 30 capsule 0  . clopidogrel (PLAVIX) 75 MG tablet TAKE 1 TABLET BY MOUTH EVERY DAY 90 tablet 1  . Cyanocobalamin (B-12 PO) Take 1 tablet by mouth daily.    . diclofenac sodium (VOLTAREN) 1 % GEL Apply 4 g topically 4  (four) times daily as needed. 400 g 11  . ferrous sulfate 325 (65 FE) MG tablet TAKE 1 TABLET (325 MG TOTAL) BY MOUTH 2 (TWO) TIMES DAILY. (Patient taking differently: Take 325 mg by mouth 2 (two) times daily with a meal. TAKE 1 TABLET (325 MG TOTAL) BY MOUTH 2 (TWO) TIMES DAILY.) 180 tablet 1  . furosemide (LASIX) 80 MG tablet Take 0.5 tablets (40 mg total) by mouth 2 (two) times daily.    Marland Kitchen glucose blood test strip ACCU CHECK - Use as instructed three time daily  E11.9 300 each 12  . HUMALOG KWIKPEN 100 UNIT/ML KiwkPen INJECT 0-0.09 MLS (0-9 UNITS TOTAL) INTO THE SKIN 2 TIMES DAILY (Patient taking differently: Inject 0-9 Units into the skin 2 (two) times daily. ) 15 pen 1  . hydrOXYzine (ATARAX/VISTARIL) 10 MG tablet Take 1 tablet (10 mg total) by mouth 3 (three) times daily as needed for itching. 30 tablet 0  . Insulin Glargine (LANTUS SOLOSTAR) 100 UNIT/ML Solostar Pen Inject 10 Units into the skin daily. 5 pen 11  . Lancets MISC Use as directed three times daily with meals 100 each 3  . metoprolol tartrate (LOPRESSOR) 100 MG tablet TAKE 1 TABLET BY MOUTH TWICE A DAY 180 tablet 3  . nitroGLYCERIN (NITROSTAT) 0.4 MG SL tablet Place 1 tablet (0.4 mg total) under the tongue every 5 (five) minutes as needed for chest pain. Reported on 04/06/2016 25 tablet 5  . omeprazole (PRILOSEC) 20 MG capsule TAKE 1 CAPSULE (20 MG TOTAL) BY MOUTH DAILY. (Patient taking differently: Take 20 mg by mouth daily. ) 90 capsule 0  . polyethylene glycol powder (GLYCOLAX/MIRALAX) powder Take 17 gm by mouth daily 3350 g 11  . Potassium Chloride ER 20 MEQ TBCR Take 20 mEq by mouth daily.   6  . risedronate (ACTONEL) 150 MG tablet TAKE 1 TABLET BY MOUTH ONCE A MONTH-TAKE ON THE SAME DAY OF EACH MONTH. (Patient taking differently: Take 150 mg by mouth every 30 (thirty) days. First Tuesday) 3 tablet 1  . Tetrahydrozoline HCl (VISINE OP) Place 2 drops into both eyes daily as needed (DRY EYE).    Marland Kitchen thiamine (VITAMIN B-1) 50 MG  tablet Take 1 tablet (50 mg total) by mouth daily. 90 tablet 1  . timolol (BETIMOL) 0.5 % ophthalmic solution Place 1 drop into both eyes daily.     Marland Kitchen tiZANidine (ZANAFLEX) 4 MG tablet TAKE 1 TABLET (4 MG TOTAL) BY MOUTH EVERY 6 (SIX) HOURS AS NEEDED FOR MUSCLE  SPASMS. 60 tablet 1  . traMADol (ULTRAM) 50 MG tablet TAKE 1 TABLET BY MOUTH EVERY 6 HOURS AS NEEDED 30 tablet 2  . zolpidem (AMBIEN) 5 MG tablet Take 1 tablet (5 mg total) by mouth at bedtime as needed for sleep. 30 tablet 5   No current facility-administered medications for this visit.     Review of Systems  Constitutional:  Constitutional negative. HENT: HENT negative.  Eyes: Eyes negative.  Cardiovascular: Cardiovascular negative.  Musculoskeletal: Positive for gait problem, leg pain and joint pain.  Skin: Positive for wound.  Psychiatric: Psychiatric negative.        Objective:  Objective   Vitals:   08/10/18 1355 08/10/18 1359  BP: (!) 185/87 (!) 183/91  Pulse: 74 74  Resp: 16   Temp: (!) 96.9 F (36.1 C)   TempSrc: Oral   SpO2: 97%   Weight: 210 lb (95.3 kg)   Height: 5' 1" (1.549 m)    Body mass index is 39.68 kg/m.  Physical Exam  Constitutional: She is oriented to person, place, and time. She appears well-developed.  HENT:  Head: Normocephalic.  Eyes: Pupils are equal, round, and reactive to light.  Neck: Normal range of motion. Neck supple.  Cardiovascular: Normal rate.  Pulses:      Popliteal pulses are 0 on the right side, and 0 on the left side.  Weak bilateral femoral pulses, monophasic DP PT bilaterally  Pulmonary/Chest: Effort normal.  Abdominal: Soft.  Musculoskeletal: Normal range of motion. She exhibits no deformity.  Right lower extremity has skin changes consistent with chronic venous insufficiency mixed with arterial insufficiency multiple ulcers  Neurological: She is alert and oriented to person, place, and time.  Skin: Skin is warm and dry.    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  At this time we will proceed with angiogram right lower extremity will need to use CO2 given her history of renal insufficiency.  We discussed the risk and benefits including the need for dialysis if she gets too much contrast.  She demonstrates good understanding we will schedule her today.     Waynetta Sandy MD [facility]

## 2018-08-10 NOTE — Telephone Encounter (Signed)
Copied from [place] [id number]. Topic: Quick Communication - Rx Refill/Question >> Aug 10, 2018  4:08 PM Reyne Dumas L wrote: Medication: HUMALOG KWIKPEN 100 UNIT/ML KiwkPen  Has the patient contacted their pharmacy? Yes - states that pharmacy tells her what she has should last her until November, but pt only has one pen left.   (Agent: If no, request that the patient contact the pharmacy for the refill.) (Agent: If yes, when and what did the pharmacy advise?)  Preferred Pharmacy (with phone number or street name): [id number] [address], [place] - [place] [phone] (Phone) [phone] (Fax)  Agent: Please be advised that RX refills may take up to 3 business days. We ask that you follow-up with your pharmacy.

## 2018-08-10 NOTE — Progress Notes (Signed)
Vitals:   08/10/18 1355  BP: (!) 185/87  Pulse: 74  Resp: 16  Temp: (!) 96.9 F (36.1 C)  TempSrc: Oral  SpO2: 97%  Weight: 210 lb (95.3 kg)  Height: 5\' 1"  (1.549 m)

## 2018-08-13 ENCOUNTER — Observation Stay (HOSPITAL_COMMUNITY)
Admission: RE | Admit: 2018-08-13 | Discharge: 2018-08-13 | Disposition: A | Discharge status: Home / Self Care | Payer: Medicare Other | Source: Ambulatory Visit | Attending: Vascular Surgery | Admitting: Vascular Surgery

## 2018-08-13 ENCOUNTER — Other Ambulatory Visit: Payer: Self-pay

## 2018-08-13 ENCOUNTER — Encounter (HOSPITAL_COMMUNITY)
Admission: RE | Disposition: A | Discharge status: Home / Self Care | Payer: Self-pay | Source: Ambulatory Visit | Attending: Vascular Surgery

## 2018-08-13 ENCOUNTER — Telehealth: Payer: Self-pay | Admitting: Vascular Surgery

## 2018-08-13 DIAGNOSIS — L97919 Non-pressure chronic ulcer of unspecified part of right lower leg with unspecified severity: Secondary | ICD-10-CM | POA: Diagnosis not present

## 2018-08-13 DIAGNOSIS — N189 Chronic kidney disease, unspecified: Secondary | ICD-10-CM | POA: Insufficient documentation

## 2018-08-13 DIAGNOSIS — I129 Hypertensive chronic kidney disease with stage 1 through stage 4 chronic kidney disease, or unspecified chronic kidney disease: Secondary | ICD-10-CM | POA: Diagnosis not present

## 2018-08-13 DIAGNOSIS — I70202 Unspecified atherosclerosis of native arteries of extremities, left leg: Secondary | ICD-10-CM | POA: Insufficient documentation

## 2018-08-13 DIAGNOSIS — Z794 Long term (current) use of insulin: Secondary | ICD-10-CM | POA: Diagnosis not present

## 2018-08-13 DIAGNOSIS — E1151 Type 2 diabetes mellitus with diabetic peripheral angiopathy without gangrene: Principal | ICD-10-CM | POA: Insufficient documentation

## 2018-08-13 DIAGNOSIS — I70239 Atherosclerosis of native arteries of right leg with ulceration of unspecified site: Secondary | ICD-10-CM | POA: Diagnosis not present

## 2018-08-13 DIAGNOSIS — I2510 Atherosclerotic heart disease of native coronary artery without angina pectoris: Secondary | ICD-10-CM | POA: Diagnosis not present

## 2018-08-13 DIAGNOSIS — Z7902 Long term (current) use of antithrombotics/antiplatelets: Secondary | ICD-10-CM | POA: Insufficient documentation

## 2018-08-13 DIAGNOSIS — E785 Hyperlipidemia, unspecified: Secondary | ICD-10-CM | POA: Diagnosis not present

## 2018-08-13 DIAGNOSIS — I739 Peripheral vascular disease, unspecified: Secondary | ICD-10-CM | POA: Diagnosis present

## 2018-08-13 DIAGNOSIS — E1122 Type 2 diabetes mellitus with diabetic chronic kidney disease: Secondary | ICD-10-CM | POA: Diagnosis not present

## 2018-08-13 DIAGNOSIS — K219 Gastro-esophageal reflux disease without esophagitis: Secondary | ICD-10-CM | POA: Diagnosis not present

## 2018-08-13 DIAGNOSIS — Z7982 Long term (current) use of aspirin: Secondary | ICD-10-CM | POA: Insufficient documentation

## 2018-08-13 HISTORY — PX: ABDOMINAL AORTOGRAM W/LOWER EXTREMITY: CATH118223

## 2018-08-13 HISTORY — PX: PERIPHERAL VASCULAR INTERVENTION: CATH118257

## 2018-08-13 LAB — POCT I-STAT, CHEM 8
BUN: 45 mg/dL — AB (ref 8–23)
CREATININE: 2.7 mg/dL — AB (ref 0.44–1.00)
Calcium, Ion: 1.15 mmol/L (ref 1.15–1.40)
Chloride: 104 mmol/L (ref 98–111)
Glucose, Bld: 321 mg/dL — ABNORMAL HIGH (ref 70–99)
HEMATOCRIT: 36 % (ref 36.0–46.0)
Hemoglobin: 12.2 g/dL (ref 12.0–15.0)
POTASSIUM: 4.2 mmol/L (ref 3.5–5.1)
SODIUM: 139 mmol/L (ref 135–145)
TCO2: 25 mmol/L (ref 22–32)

## 2018-08-13 LAB — GLUCOSE, CAPILLARY: Glucose-Capillary: 312 mg/dL — ABNORMAL HIGH (ref 70–99)

## 2018-08-13 IMAGING — CT CT HEAD W/O CM
3 of 4 series · 15 of 47 positions shown, 18 images · non-contrast
Comparison: Brain MRI 06/21/2008

CLINICAL DATA: Altered mental status and fever

EXAM:
CT HEAD WITHOUT CONTRAST
TECHNIQUE: Contiguous axial images were obtained from the base of the skull
through the vertex without intravenous contrast.

[Series 2: head w/o · axial · non-contrast · 0.45mm/px · z∈[-105,+10]mm · 9 of 29 slices shown, 12 images]
[im 3/29  brain]
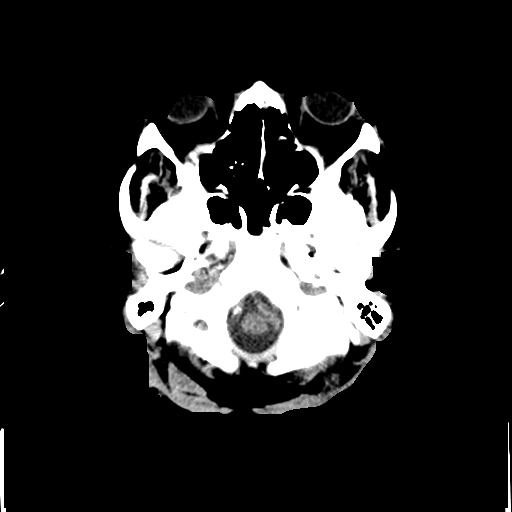
[im 3/29  bone]
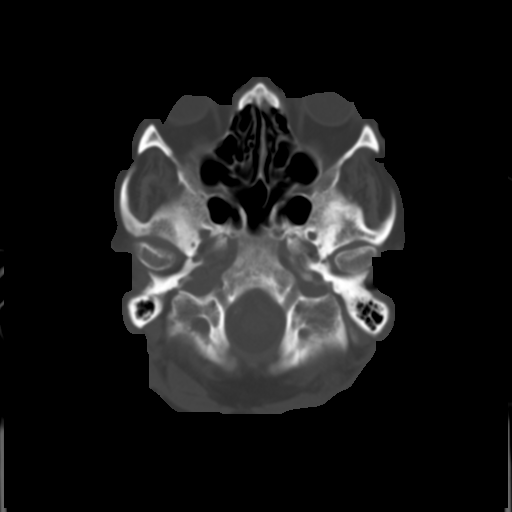
[im 6/29  brain]
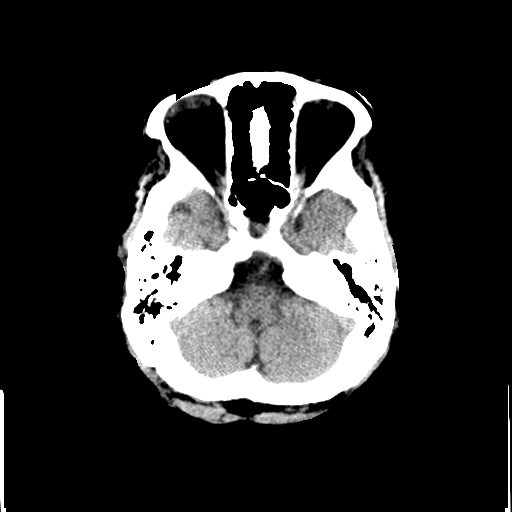
[im 9/29  brain]
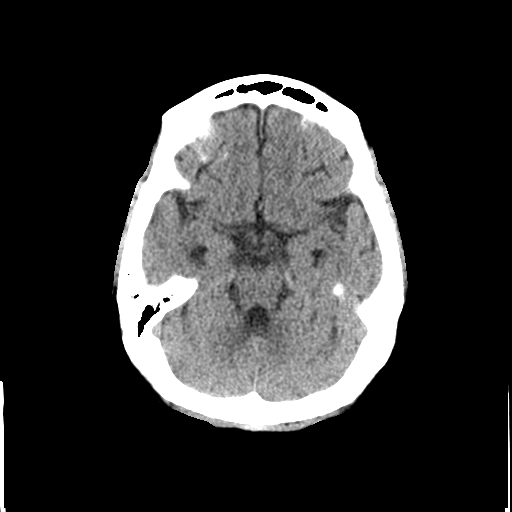
[im 12/29  brain]
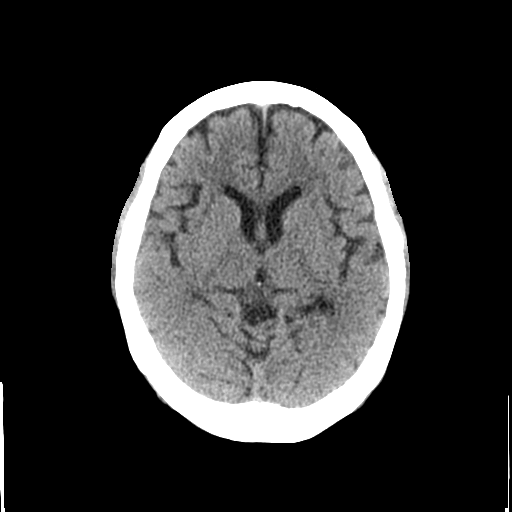
[im 15/29  brain]
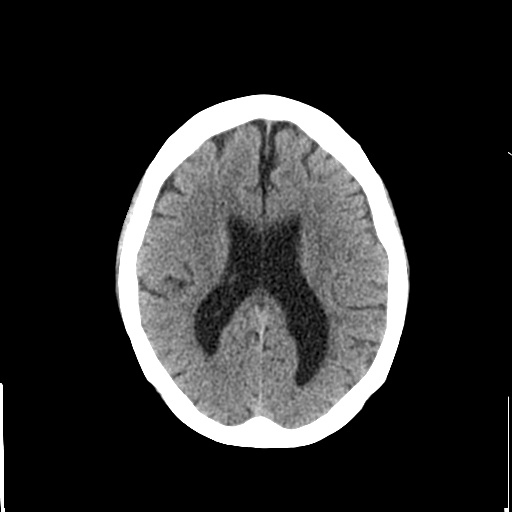
[im 15/29  bone]
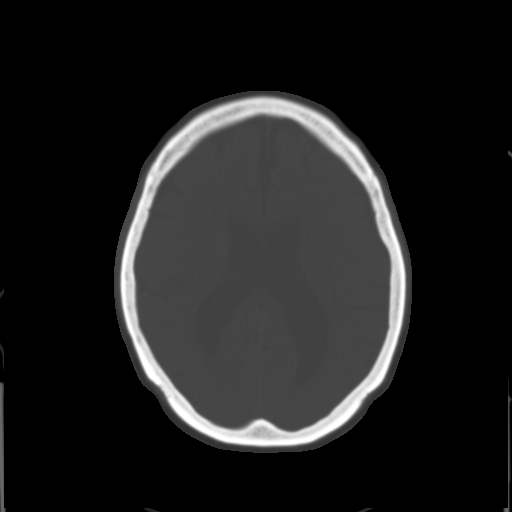
[im 17/29  brain]
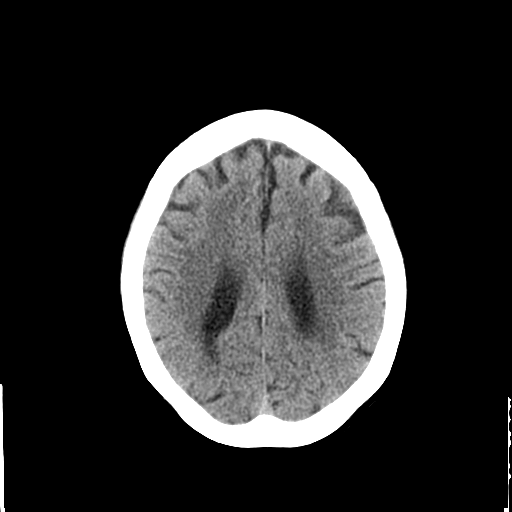
[im 20/29  brain]
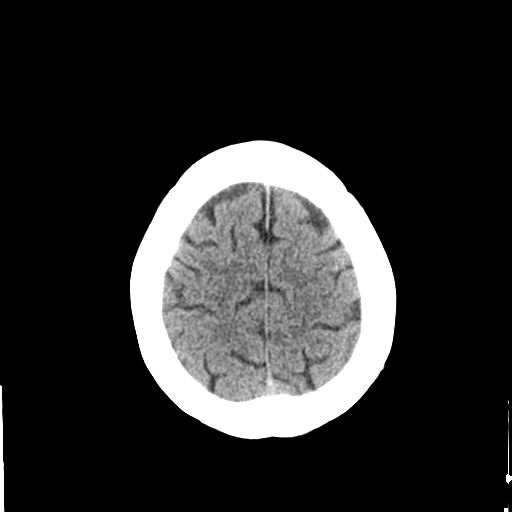
[im 23/29  brain]
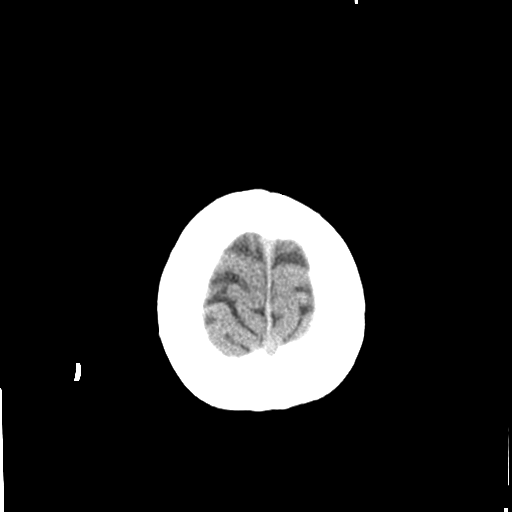
[im 26/29  brain]
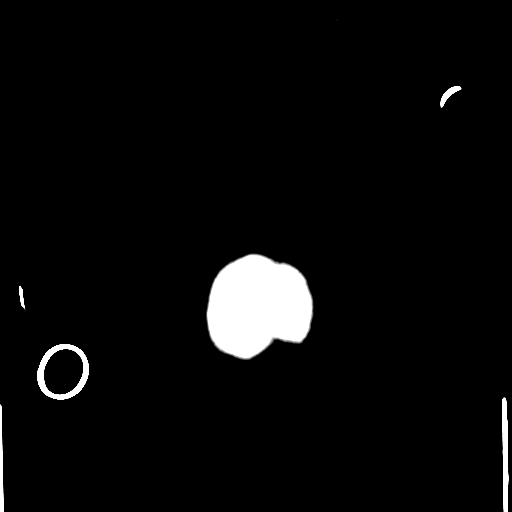
[im 26/29  bone]
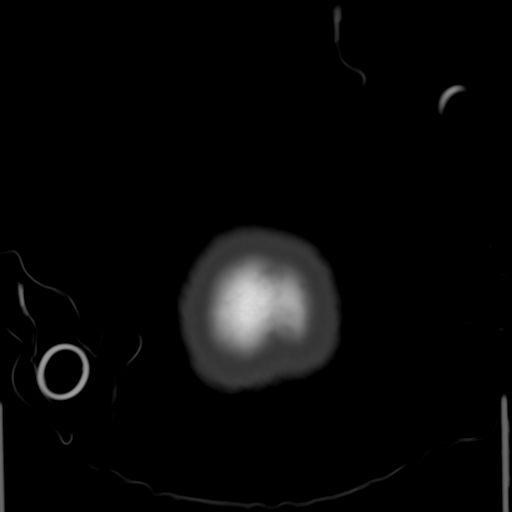

[Series 4: coronal · coronal · 0.25mm/px · 3 of 58 slices shown]
[im 20/58  brain]
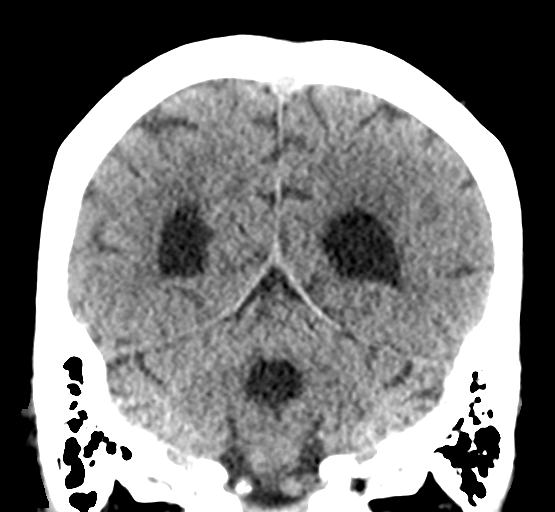
[im 26/58  brain]
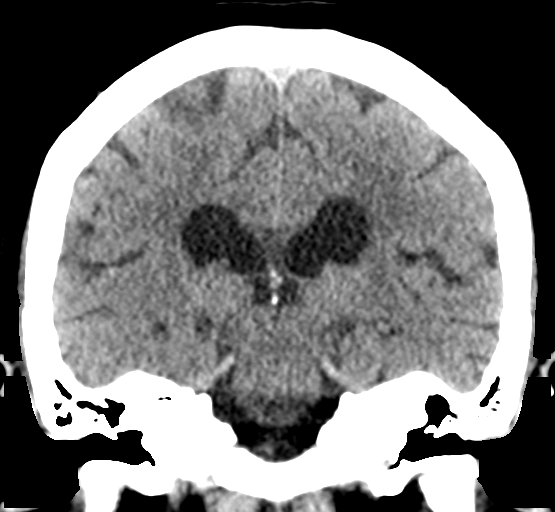
[im 32/58  brain]
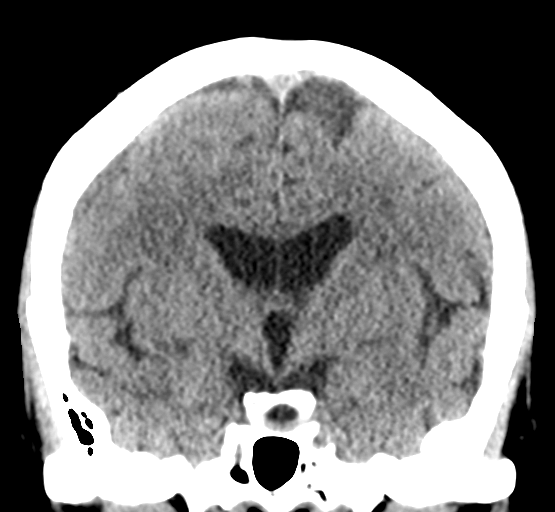

[Series 5: sagittal · sagittal · 0.25mm/px · 3 of 48 slices shown]
[im 16/48  brain]
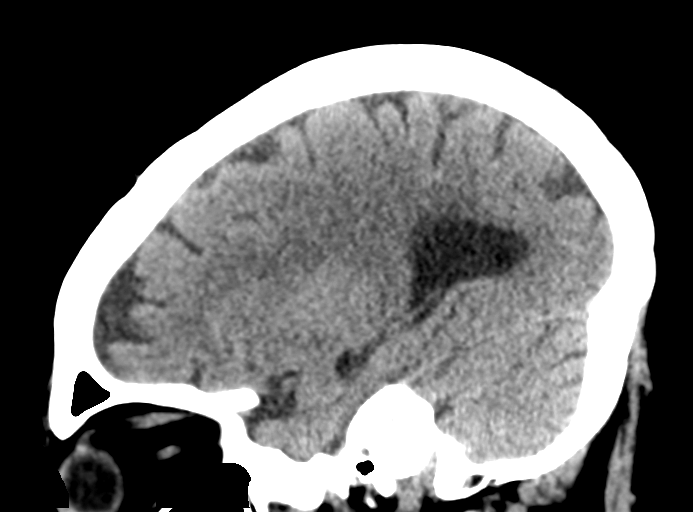
[im 24/48  brain]
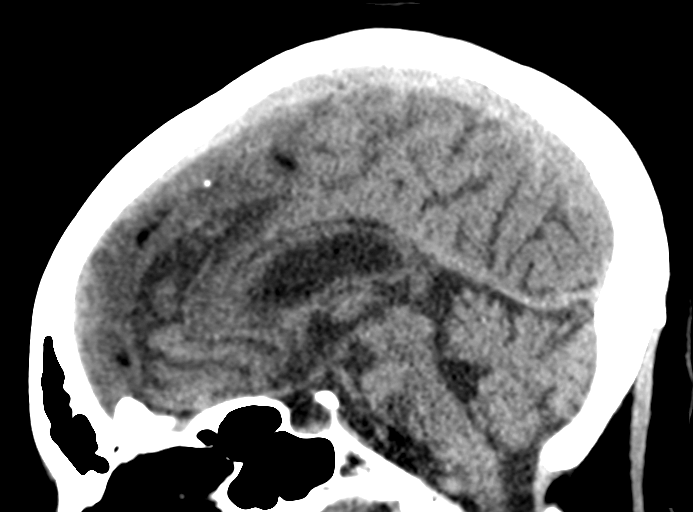
[im 32/48  brain]
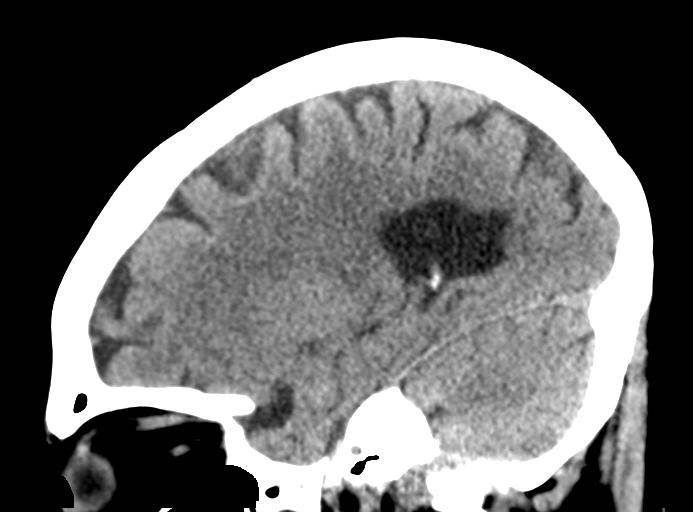

[15 of 47 positions shown; findings below may reference images not displayed]

FINDINGS: Brain: No mass lesion, intraparenchymal hemorrhage or extra-axial
collection. No evidence of acute cortical infarct. Brain parenchyma
and CSF-containing spaces are normal for age.

Vascular: No hyperdense vessel or unexpected calcification.

Skull: Normal visualized skull base, calvarium and extracranial soft
tissues.

Sinuses/Orbits: No sinus fluid levels or advanced mucosal
thickening. No mastoid effusion. Normal orbits.
IMPRESSION: Normal head CT for age.

## 2018-08-13 SURGERY — ABDOMINAL AORTOGRAM W/LOWER EXTREMITY
Anesthesia: LOCAL

## 2018-08-13 MED ORDER — LIDOCAINE HCL (PF) 1 % IJ SOLN
INTRAMUSCULAR | Status: DC | PRN
  Administered 2018-08-13: 15 mL

## 2018-08-13 MED ORDER — LABETALOL HCL 5 MG/ML IV SOLN: 10.0000 mg | INTRAVENOUS | Status: DC | PRN

## 2018-08-13 MED ORDER — LABETALOL HCL 5 MG/ML IV SOLN
INTRAVENOUS | Status: DC | PRN
  Administered 2018-08-13: 20 mg via INTRAVENOUS

## 2018-08-13 MED ORDER — HEPARIN (PORCINE) IN NACL 1000-0.9 UT/500ML-% IV SOLN
INTRAVENOUS | Status: DC | PRN
  Administered 2018-08-13: 500 mL

## 2018-08-13 MED ORDER — HYDRALAZINE HCL 20 MG/ML IJ SOLN: 5.0000 mg | INTRAMUSCULAR | Status: DC | PRN

## 2018-08-13 MED ORDER — HEPARIN SODIUM (PORCINE) 1000 UNIT/ML IJ SOLN
INTRAMUSCULAR | Status: DC | PRN
  Administered 2018-08-13: 10000 [IU] via INTRAVENOUS

## 2018-08-13 MED ORDER — OXYCODONE HCL 5 MG PO TABS: 5.0000 mg | ORAL_TABLET | ORAL | Status: DC | PRN

## 2018-08-13 MED ORDER — ACETAMINOPHEN 325 MG PO TABS: 650.0000 mg | ORAL_TABLET | ORAL | Status: DC | PRN

## 2018-08-13 MED ORDER — HEPARIN SODIUM (PORCINE) 1000 UNIT/ML IJ SOLN
INTRAMUSCULAR | Status: AC
  Filled 2018-08-13: qty 1

## 2018-08-13 MED ORDER — SODIUM CHLORIDE 0.9 % IV SOLN
INTRAVENOUS | Status: DC
  Administered 2018-08-13: 11:00:00 via INTRAVENOUS

## 2018-08-13 MED ORDER — LIDOCAINE HCL (PF) 1 % IJ SOLN
INTRAMUSCULAR | Status: AC
  Filled 2018-08-13: qty 30

## 2018-08-13 MED ORDER — LABETALOL HCL 5 MG/ML IV SOLN
INTRAVENOUS | Status: AC
  Filled 2018-08-13: qty 4

## 2018-08-13 MED ORDER — SODIUM CHLORIDE 0.9% FLUSH: 3.0000 mL | Freq: Two times a day (BID) | INTRAVENOUS | Status: DC

## 2018-08-13 MED ORDER — IODIXANOL 320 MG/ML IV SOLN
INTRAVENOUS | Status: DC | PRN
  Administered 2018-08-13: 30 mL via INTRA_ARTERIAL

## 2018-08-13 MED ORDER — HEPARIN (PORCINE) IN NACL 1000-0.9 UT/500ML-% IV SOLN
INTRAVENOUS | Status: AC
  Filled 2018-08-13: qty 1000

## 2018-08-13 MED ORDER — SODIUM CHLORIDE 0.9 % WEIGHT BASED INFUSION: 1.0000 mL/kg/h | INTRAVENOUS | Status: DC

## 2018-08-13 MED ORDER — ONDANSETRON HCL 4 MG/2ML IJ SOLN: 4.0000 mg | Freq: Four times a day (QID) | INTRAMUSCULAR | Status: DC | PRN

## 2018-08-13 MED ORDER — SODIUM CHLORIDE 0.9 % IV SOLN: 250.0000 mL | INTRAVENOUS | Status: DC | PRN

## 2018-08-13 MED ORDER — SODIUM CHLORIDE 0.9% FLUSH: 3.0000 mL | INTRAVENOUS | Status: DC | PRN

## 2018-08-13 SURGICAL SUPPLY — 22 items
CATH ANGIO 5F BER2 65CM (CATHETERS) ×1 IMPLANT
CATH OMNI FLUSH 5F 65CM (CATHETERS) ×1 IMPLANT
CATH STRAIGHT 5FR 65CM (CATHETERS) ×1 IMPLANT
CLOSURE MYNX CONTROL 6F/7F (Vascular Products) ×1 IMPLANT
DEVICE TORQUE .025-.038 (MISCELLANEOUS) ×1 IMPLANT
FILTER CO2 0.2 MICRON (VASCULAR PRODUCTS) ×1 IMPLANT
GUIDEWIRE ANGLED .035X150CM (WIRE) ×1 IMPLANT
KIT ENCORE 26 ADVANTAGE (KITS) ×1 IMPLANT
KIT MICROPUNCTURE NIT STIFF (SHEATH) ×1 IMPLANT
KIT PV (KITS) ×3 IMPLANT
RESERVOIR CO2 (VASCULAR PRODUCTS) ×1 IMPLANT
SET FLUSH CO2 (MISCELLANEOUS) ×1 IMPLANT
SHEATH HIGHFLEX ANSEL 7FR 55CM (SHEATH) ×1 IMPLANT
SHEATH PINNACLE 5F 10CM (SHEATH) ×1 IMPLANT
SHEATH PINNACLE 7F 10CM (SHEATH) ×1 IMPLANT
SHEATH PROBE COVER 6X72 (BAG) ×1 IMPLANT
STENT VIABAHN 7X39X80 VBX (Permanent Stent) ×2 IMPLANT
SYR MEDRAD MARK V 150ML (SYRINGE) ×3 IMPLANT
TRANSDUCER W/STOPCOCK (MISCELLANEOUS) ×3 IMPLANT
TRAY PV CATH (CUSTOM PROCEDURE TRAY) ×3 IMPLANT
WIRE BENTSON .035X145CM (WIRE) ×1 IMPLANT
WIRE ROSEN-J .035X260CM (WIRE) ×1 IMPLANT

## 2018-08-13 NOTE — H&P (Signed)
   History and Physical Update  The patient was interviewed and re-examined.  The patient's previous History and Physical has been reviewed and is unchanged recent office visit.  Plan for CO2 angiogram to evaluate the right lower extremity today.  Brandon C. Donzetta Matters, MD [facility] Office: [phone] Pager: [phone]  08/13/2018, 10:27 AM

## 2018-08-13 NOTE — Telephone Encounter (Signed)
Noted. Will forward to PCP and attempt to reach out to patient also to clarify dosing.

## 2018-08-13 NOTE — Telephone Encounter (Signed)
-----   Message from Waynetta Sandy, MD sent at 08/13/2018  2:21 PM EDT ----- [name] [id number] [birth year]  08/13/2018 Pre-operative Diagnosis: Peripheral arterial disease with right lower extremity wound  Surgeon:  Eda Paschal. Donzetta Matters, MD  Procedure Performed: 1.  Ultrasound guided cannulation left common femoral artery 2.  CO2 aortogram and right lower extremity angiogram 3.  Bilateral common iliac artery stenting with 7 x 39 mm VBX 4.  Minx device closure left common femoral artery  F/u in 4-6 weeks with ABI and aortoiliac duplex with me or np or pa

## 2018-08-13 NOTE — Op Note (Signed)
Patient name: [name] MRN: [id number] DOB: 20-May-1946 Sex: female  08/13/2018 Pre-operative Diagnosis: Peripheral arterial disease with right lower extremity wound Post-operative diagnosis:  Same Surgeon:  Eda Paschal. Donzetta Matters, MD Procedure Performed: 1.  Ultrasound guided cannulation left common femoral artery 2.  CO2 aortogram and right lower extremity angiogram 3.  Bilateral common iliac artery stenting with 7 x 39 mm VBX 4.  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  She has moderately depressed ABIs is indicated for angiogram with bilateral lower extremity runoff.  She has an elevated creatinine and CO2 will be used.  Findings: The aorta was calcified although free of flow-limiting stenosis.  On the left side there is an obvious 90% stenosis in the left common iliac artery.  The right common iliac artery I cannot identify anything by angiography however it was heavily calcified.  Pullback pressure gradient was 50 mmHg systolic from the external to the aorta.  Following stenting there is 0% residual stenosis bilaterally and then there is a palpable right common femoral pulse that was not there previously.  Right lower extremity runoff is via peroneal artery only.   Procedure:  The patient was identified in the holding area and taken to room 8.  The patient was then placed supine on the table and prepped and draped in the usual sterile fashion.  A time out was called.  Ultrasound was used to evaluate the the left common femoral artery which was noted to be patent although moderately underfilled.  This was cannulated with direct ultrasound guidance with micropuncture needle followed by wire and sheath.  An image was saved the permanent record.  A Bentson wire was initially placed but stopped at the common iliac artery.  A 5 French sheath was placed.  I used a Glidewire and bare  catheter to cross the stenosis in the common iliac artery on the left then exchanged for a Omni Flush catheter perform CO2 angiogram.  I do not identify any stenosis on the right side but I can identify on the left.  Contrasted angiogram demonstrated the stenosis again on the left but not on the right.  I then crossed the bifurcation perform right lower extremity angiogram with CO2 which demonstrated patent SFA popliteal and peroneal runoff to the ankle giving rise to a PT distally.  I then performed a pullback pressure gradient which was 50 mmHg in the right common iliac artery.  With this we elected to stent.  We then exchanged for a Rosen wire up and over the bifurcation and placed a long 7 [place] sheath patient was heparinized fully.  Following this we then brought a 7 x 39 mm stent to the right common iliac artery pulled back the sheath performed angiogram and deployed there.  We then brought a 7 x 39 mm stent into the left common iliac artery pullback that sheath this time performed CO2 angiogram and deployed this stent.  Completion angiogram demonstrated patency of the stents.  There is a palpable right common femoral pulse at the end of this.  Patient is already on aspirin Plavix.  So no medication was given on table.  We then exchanged for short 7 French sheath and deployed a minx device.  She tolerated this procedure well without immediate complication.  Next  Contrast: 30cc   Mc Hollen C. Donzetta Matters, MD [facility] Office: [phone] Pager: [phone]

## 2018-08-13 NOTE — Telephone Encounter (Signed)
sch appt phone NA mld ltr 09/28/18 9am Aorta/iliac 10am ABI 1030am p/o MD

## 2018-08-13 NOTE — Progress Notes (Signed)
Rennis Harding, Rn called and informed of CBG result.

## 2018-08-13 NOTE — Discharge Instructions (Signed)
Femoral Site Care °Refer to this sheet in the next few weeks. These instructions provide you with information about caring for yourself after your procedure. Your health care provider may also give you more specific instructions. Your treatment has been planned according to current medical practices, but problems sometimes occur. Call your health care provider if you have any problems or questions after your procedure. °What can I expect after the procedure? °After your procedure, it is typical to have the following: °· Bruising at the site that usually fades within 1-2 weeks. °· Blood collecting in the tissue (hematoma) that may be painful to the touch. It should usually decrease in size and tenderness within 1-2 weeks. ° °Follow these instructions at home: °· Take medicines only as directed by your health care provider. °· You may shower 24-48 hours after the procedure or as directed by your health care provider. Remove the bandage (dressing) and gently wash the site with plain soap and water. Pat the area dry with a clean towel. Do not rub the site, because this may cause bleeding. °· Do not take baths, swim, or use a hot tub until your health care provider approves. °· Check your insertion site every day for redness, swelling, or drainage. °· Do not apply powder or lotion to the site. °· Limit use of stairs to twice a day for the first 2-3 days or as directed by your health care provider. °· Do not squat for the first 2-3 days or as directed by your health care provider. °· Do not lift over 10 lb (4.5 kg) for 5 days after your procedure or as directed by your health care provider. °· Ask your health care provider when it is okay to: °? Return to work or school. °? Resume usual physical activities or sports. °? Resume sexual activity. °· Do not drive home if you are discharged the same day as the procedure. Have someone else drive you. °· You may drive 24 hours after the procedure unless otherwise instructed by  your health care provider. °· Do not operate machinery or power tools for 24 hours after the procedure or as directed by your health care provider. °· If your procedure was done as an outpatient procedure, which means that you went home the same day as your procedure, a responsible adult should be with you for the first 24 hours after you arrive home. °· Keep all follow-up visits as directed by your health care provider. This is important. °Contact a health care provider if: °· You have a fever. °· You have chills. °· You have increased bleeding from the site. Hold pressure on the site. °Get help right away if: °· You have unusual pain at the site. °· You have redness, warmth, or swelling at the site. °· You have drainage (other than a small amount of blood on the dressing) from the site. °· The site is bleeding, and the bleeding does not stop after 30 minutes of holding steady pressure on the site. °· Your leg or foot becomes pale, cool, tingly, or numb. °This information is not intended to replace advice given to you by your health care provider. Make sure you discuss any questions you have with your health care provider. °Document Released: 06/13/2014 Document Revised: 03/17/2016 Document Reviewed: 04/29/2014 °Elsevier Interactive Patient Education © 2018 [employer] °Moderate Conscious Sedation, Adult, Care After °These instructions provide you with information about caring for yourself after your procedure. Your health care provider may also give you more   specific instructions. Your treatment has been planned according to current medical practices, but problems sometimes occur. Call your health care provider if you have any problems or questions after your procedure. °What can I expect after the procedure? °After your procedure, it is common: °· To feel sleepy for several hours. °· To feel clumsy and have poor balance for several hours. °· To have poor judgment for several hours. °· To vomit if you eat too  soon. ° °Follow these instructions at home: °For at least 24 hours after the procedure: ° °· Do not: °? Participate in activities where you could fall or become injured. °? Drive. °? Use heavy machinery. °? Drink alcohol. °? Take sleeping pills or medicines that cause drowsiness. °? Make important decisions or sign legal documents. °? Take care of children on your own. °· Rest. °Eating and drinking °· Follow the diet recommended by your health care provider. °· If you vomit: °? Drink water, juice, or soup when you can drink without vomiting. °? Make sure you have little or no nausea before eating solid foods. °General instructions °· Have a responsible adult stay with you until you are awake and alert. °· Take over-the-counter and prescription medicines only as told by your health care provider. °· If you smoke, do not smoke without supervision. °· Keep all follow-up visits as told by your health care provider. This is important. °Contact a health care provider if: °· You keep feeling nauseous or you keep vomiting. °· You feel light-headed. °· You develop a rash. °· You have a fever. °Get help right away if: °· You have trouble breathing. °This information is not intended to replace advice given to you by your health care provider. Make sure you discuss any questions you have with your health care provider. °Document Released: 07/31/2013 Document Revised: 03/14/2016 Document Reviewed: 01/30/2016 °Elsevier Interactive Patient Education © 2018 Elsevier Inc. ° °

## 2018-08-13 NOTE — Telephone Encounter (Deleted)
Noted. Will forward to PCP.  

## 2018-08-13 NOTE — Telephone Encounter (Signed)
I suspect there may be some treatment dosing errors  OK to fill early this time

## 2018-08-13 NOTE — Telephone Encounter (Signed)
Tired to contact patient. Home number is disconnected. Mobile number goes to a VM that has not been set up yet.

## 2018-08-14 ENCOUNTER — Encounter (HOSPITAL_COMMUNITY): Payer: Self-pay | Admitting: Vascular Surgery

## 2018-08-14 NOTE — Telephone Encounter (Signed)
Was able to get in contact with pt's granddaughter [place]. Informed her that I have some Humalog samples that could be picked up for the patient to help get her through until insurance will cover medication again. I also informed her that numbers we have on file for patient and DPR Lucy contact were not working.   SAMPLES ARE IN SIDE B FRIDGE.

## 2018-08-16 ENCOUNTER — Encounter (HOSPITAL_COMMUNITY): Payer: Medicare Other

## 2018-08-17 ENCOUNTER — Ambulatory Visit: Payer: Medicare Other | Admitting: Vascular Surgery

## 2018-08-17 ENCOUNTER — Other Ambulatory Visit: Payer: Self-pay | Admitting: *Deleted

## 2018-08-17 DIAGNOSIS — R6889 Other general symptoms and signs: Secondary | ICD-10-CM

## 2018-08-17 DIAGNOSIS — I739 Peripheral vascular disease, unspecified: Secondary | ICD-10-CM

## 2018-08-17 DIAGNOSIS — M25561 Pain in right knee: Secondary | ICD-10-CM | POA: Diagnosis not present

## 2018-08-17 DIAGNOSIS — R2689 Other abnormalities of gait and mobility: Secondary | ICD-10-CM | POA: Diagnosis not present

## 2018-08-27 ENCOUNTER — Other Ambulatory Visit: Payer: Self-pay | Admitting: Internal Medicine

## 2018-08-30 ENCOUNTER — Encounter (HOSPITAL_COMMUNITY): Payer: Medicare Other

## 2018-09-03 ENCOUNTER — Ambulatory Visit: Payer: Medicare Other | Admitting: Podiatry

## 2018-09-03 ENCOUNTER — Encounter: Payer: Self-pay | Admitting: Podiatry

## 2018-09-03 DIAGNOSIS — M79674 Pain in right toe(s): Secondary | ICD-10-CM

## 2018-09-03 DIAGNOSIS — M79675 Pain in left toe(s): Secondary | ICD-10-CM | POA: Diagnosis not present

## 2018-09-03 DIAGNOSIS — B351 Tinea unguium: Secondary | ICD-10-CM | POA: Diagnosis not present

## 2018-09-03 DIAGNOSIS — E1151 Type 2 diabetes mellitus with diabetic peripheral angiopathy without gangrene: Secondary | ICD-10-CM | POA: Diagnosis not present

## 2018-09-03 NOTE — Patient Instructions (Signed)

## 2018-09-04 ENCOUNTER — Inpatient Hospital Stay (HOSPITAL_COMMUNITY): Admission: RE | Admit: 2018-09-04 | Payer: Medicare Other | Source: Ambulatory Visit

## 2018-09-07 ENCOUNTER — Ambulatory Visit (HOSPITAL_COMMUNITY)
Admission: RE | Admit: 2018-09-07 | Discharge: 2018-09-07 | Disposition: A | Discharge status: Home / Self Care | Payer: Medicare Other | Source: Ambulatory Visit | Attending: Nephrology | Admitting: Nephrology

## 2018-09-07 DIAGNOSIS — D631 Anemia in chronic kidney disease: Secondary | ICD-10-CM | POA: Diagnosis not present

## 2018-09-07 DIAGNOSIS — E1122 Type 2 diabetes mellitus with diabetic chronic kidney disease: Secondary | ICD-10-CM | POA: Diagnosis not present

## 2018-09-07 DIAGNOSIS — D539 Nutritional anemia, unspecified: Secondary | ICD-10-CM | POA: Diagnosis not present

## 2018-09-07 DIAGNOSIS — I129 Hypertensive chronic kidney disease with stage 1 through stage 4 chronic kidney disease, or unspecified chronic kidney disease: Secondary | ICD-10-CM | POA: Diagnosis not present

## 2018-09-07 DIAGNOSIS — N2581 Secondary hyperparathyroidism of renal origin: Secondary | ICD-10-CM | POA: Diagnosis not present

## 2018-09-07 DIAGNOSIS — N184 Chronic kidney disease, stage 4 (severe): Secondary | ICD-10-CM | POA: Diagnosis not present

## 2018-09-07 DIAGNOSIS — N189 Chronic kidney disease, unspecified: Secondary | ICD-10-CM | POA: Diagnosis not present

## 2018-09-07 DIAGNOSIS — I739 Peripheral vascular disease, unspecified: Secondary | ICD-10-CM | POA: Diagnosis not present

## 2018-09-07 LAB — IRON AND TIBC
Iron: 55 ug/dL (ref 28–170)
SATURATION RATIOS: 21 % (ref 10.4–31.8)
TIBC: 259 ug/dL (ref 250–450)
UIBC: 204 ug/dL

## 2018-09-07 LAB — FERRITIN: FERRITIN: 951 ng/mL — AB (ref 11–307)

## 2018-09-07 LAB — POCT HEMOGLOBIN-HEMACUE: HEMOGLOBIN: 11.9 g/dL — AB (ref 12.0–15.0)

## 2018-09-07 MED ORDER — EPOETIN ALFA 10000 UNIT/ML IJ SOLN: 20000.0000 [IU] | INTRAMUSCULAR | Status: DC

## 2018-09-07 MED ORDER — EPOETIN ALFA 20000 UNIT/ML IJ SOLN
INTRAMUSCULAR | Status: AC
  Administered 2018-09-07: 20000 [IU] via SUBCUTANEOUS
  Filled 2018-09-07: qty 1

## 2018-09-10 ENCOUNTER — Telehealth: Payer: Self-pay | Admitting: Internal Medicine

## 2018-09-10 NOTE — Telephone Encounter (Signed)
Copied from [place] [id number]. Topic: Quick Communication - Rx Refill/Question >> Sep 10, 2018  1:17 PM Vernona Rieger wrote: Medication: HUMALOG KWIKPEN 100 UNIT/ML KiwkPen  Has the patient contacted their pharmacy? Yes, needs it written to using 3 times a day instead of 2 times a day (Agent: If no, request that the patient contact the pharmacy for the refill.) (Agent: If yes, when and what did the pharmacy advise?)  Preferred Pharmacy (with phone number or street name): [id number] - [place], [address] [place] [address]    Agent: Please be advised that RX refills may take up to 3 business days. We ask that you follow-up with your pharmacy.

## 2018-09-11 MED ORDER — INSULIN LISPRO (1 UNIT DIAL) 100 UNIT/ML (KWIKPEN): PEN_INJECTOR | SUBCUTANEOUS | 1 refills | Status: DC

## 2018-09-11 NOTE — Telephone Encounter (Signed)
Done erx 

## 2018-09-11 NOTE — Telephone Encounter (Signed)
See request. Thanks. 

## 2018-09-11 NOTE — Addendum Note (Signed)
Addended by: [name] on: 09/11/2018 05:09 PM   Modules accepted: Orders

## 2018-09-17 DIAGNOSIS — R2689 Other abnormalities of gait and mobility: Secondary | ICD-10-CM | POA: Diagnosis not present

## 2018-09-17 DIAGNOSIS — M25561 Pain in right knee: Secondary | ICD-10-CM | POA: Diagnosis not present

## 2018-09-18 NOTE — Progress Notes (Signed)
Subjective: [name] presents today with painful, thick toenails 1-5 b/l that she cannot cut and which interfere with daily activities.  Pain is aggravated when wearing enclosed shoe gear.  She had a stent placed RLE by Dr. Donzetta Matters on August 13, 2018. She has recovered well from the procedure.  Objective: Vascular Examination: Capillary refill time immediate x 10 digits Dorsalis pedis and Posterior tibial pulses diminished left; faintly palpable right LE Digital hair x 10 digits absent Skin temperature gradient WNL b/l  Dermatological Examination: Skin thin, shiny and atrophic b/l  Toenails 1-5 b/l discolored, thick, dystrophic with subungual debris and pain with palpation to nailbeds due to thickness of nails.  Musculoskeletal: Muscle strength 5/5 to all LE muscle groups  Neurological: Sensation intact with 10 gram monofilament. Vibratory sensation intact.  ABIs 07/26/2018: Rt ABI=0.81, Rt TBI=0.44,  Lt ABI=0.51, Lt TBI=0.39  Assessment: 1. Painful onychomycosis toenails 1-5 b/l  2. NIDDM with PAD  Plan: 1. Toenails 1-5 b/l were debrided in length and girth without iatrogenic bleeding. 2. Patient to continue soft, supportive shoe gear 3. Patient to report any pedal injuries to medical professional immediately. 4. Follow up 3 months. Patient/POA to call should there be a concern in the interim.

## 2018-09-28 ENCOUNTER — Encounter: Payer: Medicare Other | Admitting: Vascular Surgery

## 2018-09-28 ENCOUNTER — Inpatient Hospital Stay (HOSPITAL_COMMUNITY): Admit: 2018-09-28 | Payer: Medicare Other

## 2018-09-28 ENCOUNTER — Encounter (HOSPITAL_COMMUNITY): Payer: Medicare Other

## 2018-10-01 ENCOUNTER — Other Ambulatory Visit: Payer: Self-pay

## 2018-10-01 ENCOUNTER — Encounter: Payer: Self-pay | Admitting: Vascular Surgery

## 2018-10-03 ENCOUNTER — Ambulatory Visit (HOSPITAL_COMMUNITY)
Admission: RE | Admit: 2018-10-03 | Discharge: 2018-10-03 | Disposition: A | Discharge status: Home / Self Care | Payer: Medicare Other | Source: Ambulatory Visit | Attending: Vascular Surgery | Admitting: Vascular Surgery

## 2018-10-03 ENCOUNTER — Ambulatory Visit (INDEPENDENT_AMBULATORY_CARE_PROVIDER_SITE_OTHER)
Admission: RE | Admit: 2018-10-03 | Discharge: 2018-10-03 | Disposition: A | Discharge status: Home / Self Care | Payer: Medicare Other | Source: Ambulatory Visit | Attending: Vascular Surgery | Admitting: Vascular Surgery

## 2018-10-03 DIAGNOSIS — R6889 Other general symptoms and signs: Secondary | ICD-10-CM | POA: Insufficient documentation

## 2018-10-03 DIAGNOSIS — I739 Peripheral vascular disease, unspecified: Secondary | ICD-10-CM

## 2018-10-05 ENCOUNTER — Encounter: Payer: Self-pay | Admitting: Vascular Surgery

## 2018-10-05 ENCOUNTER — Other Ambulatory Visit: Payer: Self-pay

## 2018-10-05 ENCOUNTER — Ambulatory Visit (INDEPENDENT_AMBULATORY_CARE_PROVIDER_SITE_OTHER): Payer: Medicare Other | Admitting: Vascular Surgery

## 2018-10-05 VITALS — BP 175/91 | HR 77 | Temp 97.1°F | Resp 18 | Ht 61.0 in | Wt 215.0 lb

## 2018-10-05 DIAGNOSIS — I771 Stricture of artery: Secondary | ICD-10-CM

## 2018-10-05 DIAGNOSIS — L98499 Non-pressure chronic ulcer of skin of other sites with unspecified severity: Secondary | ICD-10-CM | POA: Diagnosis not present

## 2018-10-05 NOTE — Progress Notes (Signed)
Patient ID: [name], female   DOB: 1945/11/17, 72 y.o.   MRN: [id number]  Reason for Consult: Follow-up (4-6 wk f/u )   Referred by [name], MD  Subjective:     HPI:  [name] is a 72 y.o. female with history of chronic kidney disease.  She had mixed arterial and venous disease in her right lower extremity.  She is also on what bilateral common iliac artery stenting.  She is doing well.  States she is walking much better.  She does have persistent swelling but all wounds are healed.  She is now wearing compression stockings.  She is taking aspirin Plavix and a statin.  Vascular risk factors include former smoking status, diabetes, chronic kidney disease  Past Medical History:  Diagnosis Date  . Anemia, iron deficiency   . Aortic atherosclerosis ([place]) 11/17/2017  . Arthritis   . CAD (coronary artery disease)   . Carotid stenosis    Carotid US (02/2014):  Bilateral ICA 40-59%; > 50% L ECA; F/u 1 year  . Cataract    removed both eyes  . Cholelithiasis 11/17/2017  . Chronic kidney disease    chronic renal failure  . DM2 (diabetes mellitus, type 2) ([place])   . GERD (gastroesophageal reflux disease)   . Glaucoma   . HLD (hyperlipidemia)   . HTN (hypertension)   . Hx of cardiovascular stress test    Lexiscan Myoview (03/24/14):  No ischemia, EF 73%, Normal  . Hypoglycemia 06/27/2017  . Mitral regurgitation   . Osteopenia 07/21/2017  . Osteoporosis   . PUD (peptic ulcer disease)   . PVD (peripheral vascular disease) ([place])   . Shortness of breath    with exertion   Family History  Problem Relation Age of Onset  . Heart disease Father   . Glaucoma Father   . Diabetes Sister   . Diabetes Son   . Colon cancer Neg Hx   . Colon polyps Neg Hx   . Esophageal cancer Neg Hx   . Rectal cancer Neg Hx   . Stomach cancer Neg Hx    Past Surgical History:  Procedure Laterality Date  . ABDOMINAL AORTOGRAM W/LOWER EXTREMITY N/A 08/13/2018   Procedure: ABDOMINAL  AORTOGRAM W/LOWER EXTREMITY;  Surgeon: Waynetta Sandy, MD;  Location: [facility];  Service: Cardiovascular;  Laterality: N/A;  . ABDOMINAL HYSTERECTOMY    . APPENDECTOMY    . AV FISTULA PLACEMENT Right 01/18/2013   Procedure: ARTERIOVENOUS (AV) FISTULA CREATION;  Surgeon: Rosetta Posner, MD;  Location: [facility];  Service: Vascular;  Laterality: Right;  Ultrasound guided  . COLONOSCOPY    . CORONARY ANGIOPLASTY WITH STENT PLACEMENT    . PERIPHERAL VASCULAR INTERVENTION Bilateral 08/13/2018   Procedure: PERIPHERAL VASCULAR INTERVENTION;  Surgeon: Waynetta Sandy, MD;  Location: [facility];  Service: Cardiovascular;  Laterality: Bilateral;  . RENAL ANGIOGRAM N/A 09/16/2011   Procedure: RENAL ANGIOGRAM;  Surgeon: Sherren Mocha, MD;  Location: [facility];  Service: Cardiovascular;  Laterality: N/A;    Short Social History:  Social History   Tobacco Use  . Smoking status: Former Smoker    Last attempt to quit: 10/25/2007    Years since quitting: 10.9  . Smokeless tobacco: Never Used  . Tobacco comment: Stopped 1.5 yrs ago January 2009  Substance Use Topics  . Alcohol use: No    No Known Allergies  Current Outpatient Medications  Medication Sig Dispense Refill  . acetaminophen (TYLENOL)  500 MG tablet Take 1-2 tablets (500-1,000 mg total) by mouth every 8 (eight) hours as needed for mild pain.    Marland Kitchen allopurinol (ZYLOPRIM) 100 MG tablet Take 100 mg by mouth daily.    Marland Kitchen amLODipine (NORVASC) 10 MG tablet TAKE ONE (1) TABLET EACH DAY (Patient taking differently: Take 10 mg by mouth daily. ) 90 tablet 2  . aspirin 81 MG tablet Take 81 mg by mouth daily.    Marland Kitchen atorvastatin (LIPITOR) 80 MG tablet TAKE 1 TABLET (80 MG TOTAL) BY MOUTH DAILY AT 6 PM. 90 tablet 3  . BD PEN NEEDLE NANO U/F 32G X 4 MM MISC USE DAILY WITH LANTUS SOLASTAR 100 each 11  . blood glucose meter kit and supplies KIT Dispense based on patient and insurance preference. Use up to three times daily as  directed. (FOR ICD-9 250.00, 250.01). 1 each 0  . calcitRIOL (ROCALTROL) 0.25 MCG capsule Take 0.25 mcg by mouth daily.    . clindamycin (CLEOCIN) 300 MG capsule Take 1 capsule (300 mg total) by mouth 3 (three) times daily. 30 capsule 0  . clopidogrel (PLAVIX) 75 MG tablet TAKE 1 TABLET BY MOUTH EVERY DAY 90 tablet 1  . Cyanocobalamin (B-12 PO) Take 1 tablet by mouth daily.    . diclofenac sodium (VOLTAREN) 1 % GEL Apply 4 g topically 4 (four) times daily as needed. 400 g 11  . ferrous sulfate 325 (65 FE) MG tablet TAKE 1 TABLET (325 MG TOTAL) BY MOUTH 2 (TWO) TIMES DAILY. (Patient taking differently: Take 325 mg by mouth 2 (two) times daily with a meal. TAKE 1 TABLET (325 MG TOTAL) BY MOUTH 2 (TWO) TIMES DAILY.) 180 tablet 1  . furosemide (LASIX) 80 MG tablet Take 0.5 tablets (40 mg total) by mouth 2 (two) times daily.    Marland Kitchen glucose blood test strip ACCU CHECK - Use as instructed three time daily  E11.9 300 each 12  . hydrOXYzine (ATARAX/VISTARIL) 10 MG tablet Take 1 tablet (10 mg total) by mouth 3 (three) times daily as needed for itching. 30 tablet 0  . Insulin Glargine (LANTUS SOLOSTAR) 100 UNIT/ML Solostar Pen Inject 10 Units into the skin daily. 5 pen 11  . insulin lispro (HUMALOG KWIKPEN) 100 UNIT/ML KwikPen INJECT 0-0.9 ml (0-9 units) into the skin 3 times per day 15 pen 1  . Lancets MISC Use as directed three times daily with meals 100 each 3  . metoprolol tartrate (LOPRESSOR) 100 MG tablet TAKE 1 TABLET BY MOUTH TWICE A DAY 180 tablet 2  . nitroGLYCERIN (NITROSTAT) 0.4 MG SL tablet Place 1 tablet (0.4 mg total) under the tongue every 5 (five) minutes as needed for chest pain. Reported on 04/06/2016 25 tablet 5  . omeprazole (PRILOSEC) 20 MG capsule TAKE 1 CAPSULE (20 MG TOTAL) BY MOUTH DAILY. (Patient taking differently: Take 20 mg by mouth daily. ) 90 capsule 0  . polyethylene glycol powder (GLYCOLAX/MIRALAX) powder Take 17 gm by mouth daily 3350 g 11  . Potassium Chloride ER 20 MEQ TBCR  Take 20 mEq by mouth daily.   6  . risedronate (ACTONEL) 150 MG tablet TAKE 1 TABLET BY MOUTH ONCE A MONTH-TAKE ON THE SAME DAY OF EACH MONTH. (Patient taking differently: Take 150 mg by mouth every 30 (thirty) days. First Tuesday) 3 tablet 1  . Tetrahydrozoline HCl (VISINE OP) Place 2 drops into both eyes daily as needed (DRY EYE).    Marland Kitchen thiamine (VITAMIN B-1) 50 MG tablet Take 1 tablet (50  mg total) by mouth daily. 90 tablet 1  . timolol (BETIMOL) 0.5 % ophthalmic solution Place 1 drop into both eyes daily.     Marland Kitchen tiZANidine (ZANAFLEX) 4 MG tablet TAKE 1 TABLET (4 MG TOTAL) BY MOUTH EVERY 6 (SIX) HOURS AS NEEDED FOR MUSCLE SPASMS. 60 tablet 1  . traMADol (ULTRAM) 50 MG tablet TAKE 1 TABLET BY MOUTH EVERY 6 HOURS AS NEEDED 30 tablet 2  . zolpidem (AMBIEN) 5 MG tablet Take 1 tablet (5 mg total) by mouth at bedtime as needed for sleep. 30 tablet 5   No current facility-administered medications for this visit.     Review of Systems  Constitutional:  Constitutional negative. HENT: HENT negative.  Eyes: Eyes negative.  Respiratory: Positive for shortness of breath.  Cardiovascular: Positive for leg swelling.  Musculoskeletal: Musculoskeletal negative.  Skin: Skin negative.  Neurological: Positive for focal weakness.  Hematologic: Hematologic/lymphatic negative.  Psychiatric: Psychiatric negative.        Objective:  Objective   Vitals:   10/05/18 0924  BP: (!) 175/91  Pulse: 77  Resp: 18  Temp: (!) 97.1 F (36.2 C)  TempSrc: Oral  SpO2: 90%  Weight: 215 lb (97.5 kg)  Height: '5\' 1"'$  (1.549 m)   Body mass index is 40.62 kg/m.  Physical Exam HENT:     Head: Normocephalic.  Eyes:     Pupils: Pupils are equal, round, and reactive to light.  Neck:     Musculoskeletal: Normal range of motion and neck supple.     Vascular: No carotid bruit.  Cardiovascular:     Rate and Rhythm: Normal rate.     Comments: Palpable radial pulses bilaterally Strong peroneal artery signal on the  right and can be traced out dorsalis pedis to the toes Abdominal:     Palpations: Abdomen is soft.  Musculoskeletal:     Right lower leg: Edema present.     Left lower leg: Edema present.  Lymphadenopathy:     Cervical: No cervical adenopathy.  Skin:    General: Skin is warm and dry.  Neurological:     General: No focal deficit present.     Mental Status: She is alert and oriented to person, place, and time.  Psychiatric:        Mood and Affect: Mood normal.        Behavior: Behavior normal.        Thought Content: Thought content normal.        Judgment: Judgment normal.     Data: Right common iliac artery demonstrates elevated velocity 370 cm/s left common iliac artery 191.  This gives a right common iliac artery stent stenosis range greater than 50% although both stents are patent  ABI on the right 1.58 with toe pressure 97 on the left 0.74 with toe pressure 116     Assessment/Plan:     72 year old female status post bilateral common iliac artery stenting for mixed arterial and venous disease with ulceration on the right lower extremity which has now healed.  She is now walking better.  Her stents are patent although there appears to be stenosis of the right common iliac artery which I think is residual from previous as the angiogram did not demonstrate renal stenosis but pressure gradient was 50 mmHg down to 0 after stenting.  Since she is improved has chronic kidney disease which is somewhat prohibitive to have further angiography will continue on aspirin Plavix follow-up in 3 to 4 months with repeat studies.  I have recommended compression stockings bilaterally.     Waynetta Sandy MD [facility]

## 2018-10-09 ENCOUNTER — Ambulatory Visit (HOSPITAL_COMMUNITY)
Admission: RE | Admit: 2018-10-09 | Discharge: 2018-10-09 | Disposition: A | Discharge status: Home / Self Care | Payer: Medicare Other | Source: Ambulatory Visit | Attending: Nephrology | Admitting: Nephrology

## 2018-10-09 VITALS — BP 155/75 | HR 72 | Temp 98.2°F | Resp 20

## 2018-10-09 DIAGNOSIS — D539 Nutritional anemia, unspecified: Secondary | ICD-10-CM

## 2018-10-09 LAB — POCT HEMOGLOBIN-HEMACUE: Hemoglobin: 12.5 g/dL (ref 12.0–15.0)

## 2018-10-09 MED ORDER — EPOETIN ALFA 10000 UNIT/ML IJ SOLN: 20000.0000 [IU] | INTRAMUSCULAR | Status: DC

## 2018-10-10 ENCOUNTER — Other Ambulatory Visit: Payer: Self-pay | Admitting: Internal Medicine

## 2018-10-17 DIAGNOSIS — R2689 Other abnormalities of gait and mobility: Secondary | ICD-10-CM | POA: Diagnosis not present

## 2018-10-17 DIAGNOSIS — M25561 Pain in right knee: Secondary | ICD-10-CM | POA: Diagnosis not present

## 2018-10-22 DIAGNOSIS — N2581 Secondary hyperparathyroidism of renal origin: Secondary | ICD-10-CM | POA: Diagnosis not present

## 2018-11-01 ENCOUNTER — Other Ambulatory Visit: Payer: Self-pay | Admitting: Internal Medicine

## 2018-11-02 NOTE — Telephone Encounter (Signed)
Done erx 

## 2018-11-13 ENCOUNTER — Inpatient Hospital Stay (HOSPITAL_COMMUNITY): Admission: RE | Admit: 2018-11-13 | Payer: Medicare Other | Source: Ambulatory Visit

## 2018-11-14 ENCOUNTER — Ambulatory Visit: Payer: Medicare Other | Admitting: Cardiovascular Disease

## 2018-11-14 ENCOUNTER — Encounter: Payer: Self-pay | Admitting: Cardiovascular Disease

## 2018-11-14 VITALS — BP 130/80 | HR 69 | Ht 61.0 in | Wt 221.4 lb

## 2018-11-14 DIAGNOSIS — I10 Essential (primary) hypertension: Secondary | ICD-10-CM

## 2018-11-14 DIAGNOSIS — I25119 Atherosclerotic heart disease of native coronary artery with unspecified angina pectoris: Secondary | ICD-10-CM | POA: Diagnosis not present

## 2018-11-14 DIAGNOSIS — I5032 Chronic diastolic (congestive) heart failure: Secondary | ICD-10-CM

## 2018-11-14 DIAGNOSIS — I739 Peripheral vascular disease, unspecified: Secondary | ICD-10-CM | POA: Diagnosis not present

## 2018-11-14 NOTE — Patient Instructions (Signed)
Medication Instructions:  Your provider recommends that you continue on your current medications as directed. Please refer to the Current Medication list given to you today.    Labwork: None  Testing/Procedures: None  Follow-Up: Your provider wants you to follow-up in: 1 year with Dr. Burt Knack or his assistant. You will receive a reminder letter in the mail two months in advance. If you don't receive a letter, please call our office to schedule the follow-up appointment.

## 2018-11-14 NOTE — Progress Notes (Signed)
Cardiology Office Note:    Date:  11/14/2018   ID:  Ashley, [name] 12-23-1945, MRN [id number]  PCP:  [name], MD  Cardiologist:  Sherren Mocha, MD  Electrophysiologist:  None   Referring MD: [name], MD   Chief Complaint  Patient presents with  . Shortness of Breath    History of Present Illness:    [name] is a 73 y.o. female with a hx of coronary artery disease, hypertension, and hyperlipidemia.  The patient underwent DES implantation in the circumflex in 2004.  She has a history of renal artery stenosis with occlusion of the left renal artery and an atrophic left kidney.  She has peripheral arterial disease and has undergone bilateral iliac stenting.  She is followed by vascular surgery.  Other medical problems include chronic kidney disease stage 4, type 2 diabetes, hypertension, hyperlipidemia, and carotid stenosis.  The patient is here with her daughter today.  She moved in with her about 1 year ago because she needed more help.  The patient complains of shortness of breath with activity.  Her breathing has gotten worse over the past year.  She is gained about 10 pounds and attributes her worsening breathing to weight gain.  I checked an echocardiogram I saw her last year and this showed normal LV function with mild diastolic dysfunction and no valvular disease.  The patient has mild leg swelling unchanged over time.  She denies orthopnea or PND.  She denies chest pain or pressure. Her chronic kidney disease is followed by Dr Justin Mend.   Past Medical History:  Diagnosis Date  . Anemia, iron deficiency   . Aortic atherosclerosis ([employer]) 11/17/2017  . Arthritis   . CAD (coronary artery disease)   . Carotid stenosis    Carotid US (02/2014):  Bilateral ICA 40-59%; > 50% L ECA; F/u 1 year  . Cataract    removed both eyes  . Cholelithiasis 11/17/2017  . Chronic kidney disease    chronic renal failure  . DM2 (diabetes mellitus, type 2) ([place])   . GERD  (gastroesophageal reflux disease)   . Glaucoma   . HLD (hyperlipidemia)   . HTN (hypertension)   . Hx of cardiovascular stress test    Lexiscan Myoview (03/24/14):  No ischemia, EF 73%, Normal  . Hypoglycemia 06/27/2017  . Mitral regurgitation   . Osteopenia 07/21/2017  . Osteoporosis   . PUD (peptic ulcer disease)   . PVD (peripheral vascular disease) ([place])   . Shortness of breath    with exertion    Past Surgical History:  Procedure Laterality Date  . ABDOMINAL AORTOGRAM W/LOWER EXTREMITY N/A 08/13/2018   Procedure: ABDOMINAL AORTOGRAM W/LOWER EXTREMITY;  Surgeon: Waynetta Sandy, MD;  Location: [facility];  Service: Cardiovascular;  Laterality: N/A;  . ABDOMINAL HYSTERECTOMY    . APPENDECTOMY    . AV FISTULA PLACEMENT Right 01/18/2013   Procedure: ARTERIOVENOUS (AV) FISTULA CREATION;  Surgeon: Rosetta Posner, MD;  Location: [facility];  Service: Vascular;  Laterality: Right;  Ultrasound guided  . COLONOSCOPY    . CORONARY ANGIOPLASTY WITH STENT PLACEMENT    . PERIPHERAL VASCULAR INTERVENTION Bilateral 08/13/2018   Procedure: PERIPHERAL VASCULAR INTERVENTION;  Surgeon: Waynetta Sandy, MD;  Location: [facility];  Service: Cardiovascular;  Laterality: Bilateral;  . RENAL ANGIOGRAM N/A 09/16/2011   Procedure: RENAL ANGIOGRAM;  Surgeon: Sherren Mocha, MD;  Location: [facility] LAB;  Service: Cardiovascular;  Laterality: N/A;  Current Medications: Current Meds  Medication Sig  . acetaminophen (TYLENOL) 500 MG tablet Take 1-2 tablets (500-1,000 mg total) by mouth every 8 (eight) hours as needed for mild pain.  Marland Kitchen allopurinol (ZYLOPRIM) 100 MG tablet Take 100 mg by mouth daily.  Marland Kitchen amLODipine (NORVASC) 10 MG tablet TAKE ONE (1) TABLET EACH DAY  . aspirin 81 MG tablet Take 81 mg by mouth daily.  Marland Kitchen atorvastatin (LIPITOR) 80 MG tablet TAKE 1 TABLET (80 MG TOTAL) BY MOUTH DAILY AT 6 PM.  . BD PEN NEEDLE NANO U/F 32G X 4 MM MISC USE DAILY WITH LANTUS SOLASTAR  .  blood glucose meter kit and supplies KIT Dispense based on patient and insurance preference. Use up to three times daily as directed. (FOR ICD-9 250.00, 250.01).  . calcitRIOL (ROCALTROL) 0.25 MCG capsule Take 0.25 mcg by mouth daily.  . clindamycin (CLEOCIN) 300 MG capsule Take 1 capsule (300 mg total) by mouth 3 (three) times daily.  . clopidogrel (PLAVIX) 75 MG tablet TAKE 1 TABLET BY MOUTH EVERY DAY  . Cyanocobalamin (B-12 PO) Take 1 tablet by mouth daily.  . diclofenac sodium (VOLTAREN) 1 % GEL Apply 4 g topically 4 (four) times daily as needed.  . ferrous sulfate 325 (65 FE) MG tablet TAKE 1 TABLET (325 MG TOTAL) BY MOUTH 2 (TWO) TIMES DAILY.  . furosemide (LASIX) 80 MG tablet Take 0.5 tablets (40 mg total) by mouth 2 (two) times daily.  Marland Kitchen glucose blood test strip ACCU CHECK - Use as instructed three time daily  E11.9  . hydrOXYzine (ATARAX/VISTARIL) 10 MG tablet Take 1 tablet (10 mg total) by mouth 3 (three) times daily as needed for itching.  . Insulin Glargine (LANTUS SOLOSTAR) 100 UNIT/ML Solostar Pen Inject 10 Units into the skin daily.  . insulin lispro (HUMALOG KWIKPEN) 100 UNIT/ML KwikPen INJECT 0-0.9 ml (0-9 units) into the skin 3 times per day  . Lancets MISC Use as directed three times daily with meals  . metoprolol tartrate (LOPRESSOR) 100 MG tablet TAKE 1 TABLET BY MOUTH TWICE A DAY  . nitroGLYCERIN (NITROSTAT) 0.4 MG SL tablet Place 1 tablet (0.4 mg total) under the tongue every 5 (five) minutes as needed for chest pain. Reported on 04/06/2016  . omeprazole (PRILOSEC) 20 MG capsule TAKE 1 CAPSULE (20 MG TOTAL) BY MOUTH DAILY.  Marland Kitchen polyethylene glycol powder (GLYCOLAX/MIRALAX) powder Take 17 gm by mouth daily  . Potassium Chloride ER 20 MEQ TBCR Take 20 mEq by mouth daily.   . risedronate (ACTONEL) 150 MG tablet Take 1 tablet (150 mg total) by mouth every 30 (thirty) days. First Tuesday  . Tetrahydrozoline HCl (VISINE OP) Place 2 drops into both eyes daily as needed (DRY EYE).  Marland Kitchen  thiamine (VITAMIN B-1) 50 MG tablet Take 1 tablet (50 mg total) by mouth daily.  . timolol (BETIMOL) 0.5 % ophthalmic solution Place 1 drop into both eyes daily.   Marland Kitchen tiZANidine (ZANAFLEX) 4 MG tablet TAKE 1 TABLET (4 MG TOTAL) BY MOUTH EVERY 6 (SIX) HOURS AS NEEDED FOR MUSCLE SPASMS.  Marland Kitchen traMADol (ULTRAM) 50 MG tablet TAKE 1 TABLET BY MOUTH EVERY 6 HOURS AS NEEDED  . zolpidem (AMBIEN) 5 MG tablet Take 1 tablet (5 mg total) by mouth at bedtime as needed for sleep.     Allergies:   Patient has no known allergies.   Social History   Socioeconomic History  . Marital status: Single    Spouse name: Not on file  . Number of children:  3  . Years of education: Not on file  . Highest education level: Not on file  Occupational History  . Occupation: homemaker  Social Needs  . Financial resource strain: Not hard at all  . Food insecurity:    Worry: Never true    Inability: Never true  . Transportation needs:    Medical: No    Non-medical: No  Tobacco Use  . Smoking status: Former Smoker    Last attempt to quit: 10/25/2007    Years since quitting: 11.0  . Smokeless tobacco: Never Used  . Tobacco comment: Stopped 1.5 yrs ago January 2009  Substance and Sexual Activity  . Alcohol use: No  . Drug use: No  . Sexual activity: Never  Lifestyle  . Physical activity:    Days per week: 0 days    Minutes per session: 0 min  . Stress: Not at all  Relationships  . Social connections:    Talks on phone: More than three times a week    Gets together: More than three times a week    Attends religious service: Not on file    Active member of club or organization: Not on file    Attends meetings of clubs or organizations: Not on file    Relationship status: Not on file  Other Topics Concern  . Not on file  Social History Narrative  . Not on file     Family History: The patient's family history includes Diabetes in her sister and son; Glaucoma in her father; Heart disease in her father. There  is no history of Colon cancer, Colon polyps, Esophageal cancer, Rectal cancer, or Stomach cancer.  ROS:   Please see the history of present illness.    Positive for weight gain, back pain, muscle pain, dizziness.  All other systems reviewed and are negative.  EKGs/Labs/Other Studies Reviewed:    The following studies were reviewed today: 2D echocardiogram 08/14/2017: Study Conclusions  - Left ventricle: The cavity size was normal. Systolic function was   normal. The estimated ejection fraction was in the range of 55%   to 60%. Wall motion was normal; there were no regional wall   motion abnormalities. Doppler parameters are consistent with   abnormal left ventricular relaxation (grade 1 diastolic   dysfunction). Doppler parameters are consistent with elevated   ventricular end-diastolic filling pressure. - Aortic valve: Trileaflet; mildly thickened, mildly calcified   leaflets. There was no regurgitation. - Mitral valve: There was mild regurgitation. - Left atrium: The atrium was mildly dilated. - Right ventricle: The cavity size was normal. Wall thickness was   normal. Systolic function was normal. - Right atrium: The atrium was mildly dilated. - Tricuspid valve: There was moderate regurgitation. - Pulmonic valve: There was mild regurgitation. - Pulmonary arteries: Systolic pressure was moderately increased.   PA peak pressure: 53 mm Hg (S). - Inferior vena cava: The vessel was normal in size.  EKG:  EKG is ordered today.  The ekg ordered today demonstrates normal sinus rhythm 69 bpm, age-indeterminate septal infarct, otherwise within normal limits  Recent Labs: 06/14/2018: ALT 15 07/26/2018: Platelets 321 08/13/2018: BUN 45; Creatinine, Ser 2.70; Potassium 4.2; Sodium 139 10/09/2018: Hemoglobin 12.5  Recent Lipid Panel    Component Value Date/Time   CHOL 150 06/14/2018 1051   TRIG 103.0 06/14/2018 1051   HDL 48.30 06/14/2018 1051   CHOLHDL 3 06/14/2018 1051   VLDL 20.6  06/14/2018 1051   LDLCALC 81 06/14/2018 1051   LDLDIRECT 105.0  06/08/2017 1553    Physical Exam:    VS:  BP 130/80   Pulse 69   Ht '5\' 1"'$  (1.549 m)   Wt 221 lb 6.4 oz (100.4 kg)   SpO2 96%   BMI 41.83 kg/m     Wt Readings from Last 3 Encounters:  11/14/18 221 lb 6.4 oz (100.4 kg)  10/05/18 215 lb (97.5 kg)  08/13/18 211 lb (95.7 kg)     GEN:  Pleasant, morbidly obese woman, in no acute distress HEENT: Normal NECK: No JVD; No carotid bruits LYMPHATICS: No lymphadenopathy CARDIAC: RRR, 2/6 SEM at the RUSB RESPIRATORY:  Clear to auscultation without rales, wheezing or rhonchi  ABDOMEN: Soft, non-tender, non-distended MUSCULOSKELETAL:  Trace bilateral pretibial edema; No deformity  SKIN: Warm and dry NEUROLOGIC:  Alert and oriented x 3 PSYCHIATRIC:  Normal affect   ASSESSMENT:    1. Coronary artery disease involving native coronary artery of native heart with angina pectoris ([place])   2. Chronic diastolic heart failure ([place])   3. PAD (peripheral artery disease) ([place])   4. Essential hypertension    PLAN:    In order of problems listed above:  1. The patient appears stable on her current antianginal regimen which includes amlodipine and metoprolol.  She is on dual antiplatelet therapy with aspirin and clopidogrel related to her peripheral arterial disease.  She underwent viabahn stent placement a few months ago.  No changes are made in her medical regimen today. 2. Difficult to know her volume status in the setting of morbid obesity.  She has New York Heart Association functional class III symptoms.  Her echo was fairly unimpressive last year with normal LV systolic function and only mild diastolic dysfunction.  She will continue on a loop diuretic with furosemide 40 mg twice daily.  She is otherwise treated with multiple antihypertensive drugs.  We discussed the importance of sodium restriction and weight loss. 3. Followed by vascular surgery.  Appears to be stable.  Her leg  wounds have healed and she states that she is walking better. 4. Blood pressure is well controlled.  Chronic kidney disease followed by Dr. Justin Mend.   Medication Adjustments/Labs and Tests Ordered: Current medicines are reviewed at length with the patient today.  Concerns regarding medicines are outlined above.  Orders Placed This Encounter  Procedures  . EKG 12-Lead   No orders of the defined types were placed in this encounter.   Patient Instructions  Medication Instructions:  Your provider recommends that you continue on your current medications as directed. Please refer to the Current Medication list given to you today.    Labwork: None  Testing/Procedures: None  Follow-Up: Your provider wants you to follow-up in: 1 year with Dr. Burt Knack or his assistant. You will receive a reminder letter in the mail two months in advance. If you don't receive a letter, please call our office to schedule the follow-up appointment.      Signed, Sherren Mocha, MD  11/14/2018 5:31 PM    [place]

## 2018-11-17 DIAGNOSIS — R2689 Other abnormalities of gait and mobility: Secondary | ICD-10-CM | POA: Diagnosis not present

## 2018-11-17 DIAGNOSIS — M25561 Pain in right knee: Secondary | ICD-10-CM | POA: Diagnosis not present

## 2018-11-19 ENCOUNTER — Ambulatory Visit: Payer: Medicare Other | Admitting: Cardiovascular Disease

## 2018-11-20 ENCOUNTER — Encounter (HOSPITAL_COMMUNITY): Payer: Medicare Other

## 2018-11-20 ENCOUNTER — Other Ambulatory Visit: Payer: Self-pay | Admitting: Internal Medicine

## 2018-11-22 ENCOUNTER — Inpatient Hospital Stay (HOSPITAL_COMMUNITY): Admission: RE | Admit: 2018-11-22 | Payer: Medicare Other | Source: Ambulatory Visit

## 2018-11-25 ENCOUNTER — Other Ambulatory Visit: Payer: Self-pay | Admitting: Internal Medicine

## 2018-11-28 IMAGING — DX DG ABDOMEN ACUTE W/ 1V CHEST
3 series · 3 of 3 positions shown · non-contrast
Comparison: 11/15/2017

CLINICAL DATA: RUQ pain x 7 days, no chest complains, HTN, DM, hx
of CAD, stents, x-smoker.

EXAM:
DG ABDOMEN ACUTE W/ 1V CHEST

[chest pa]
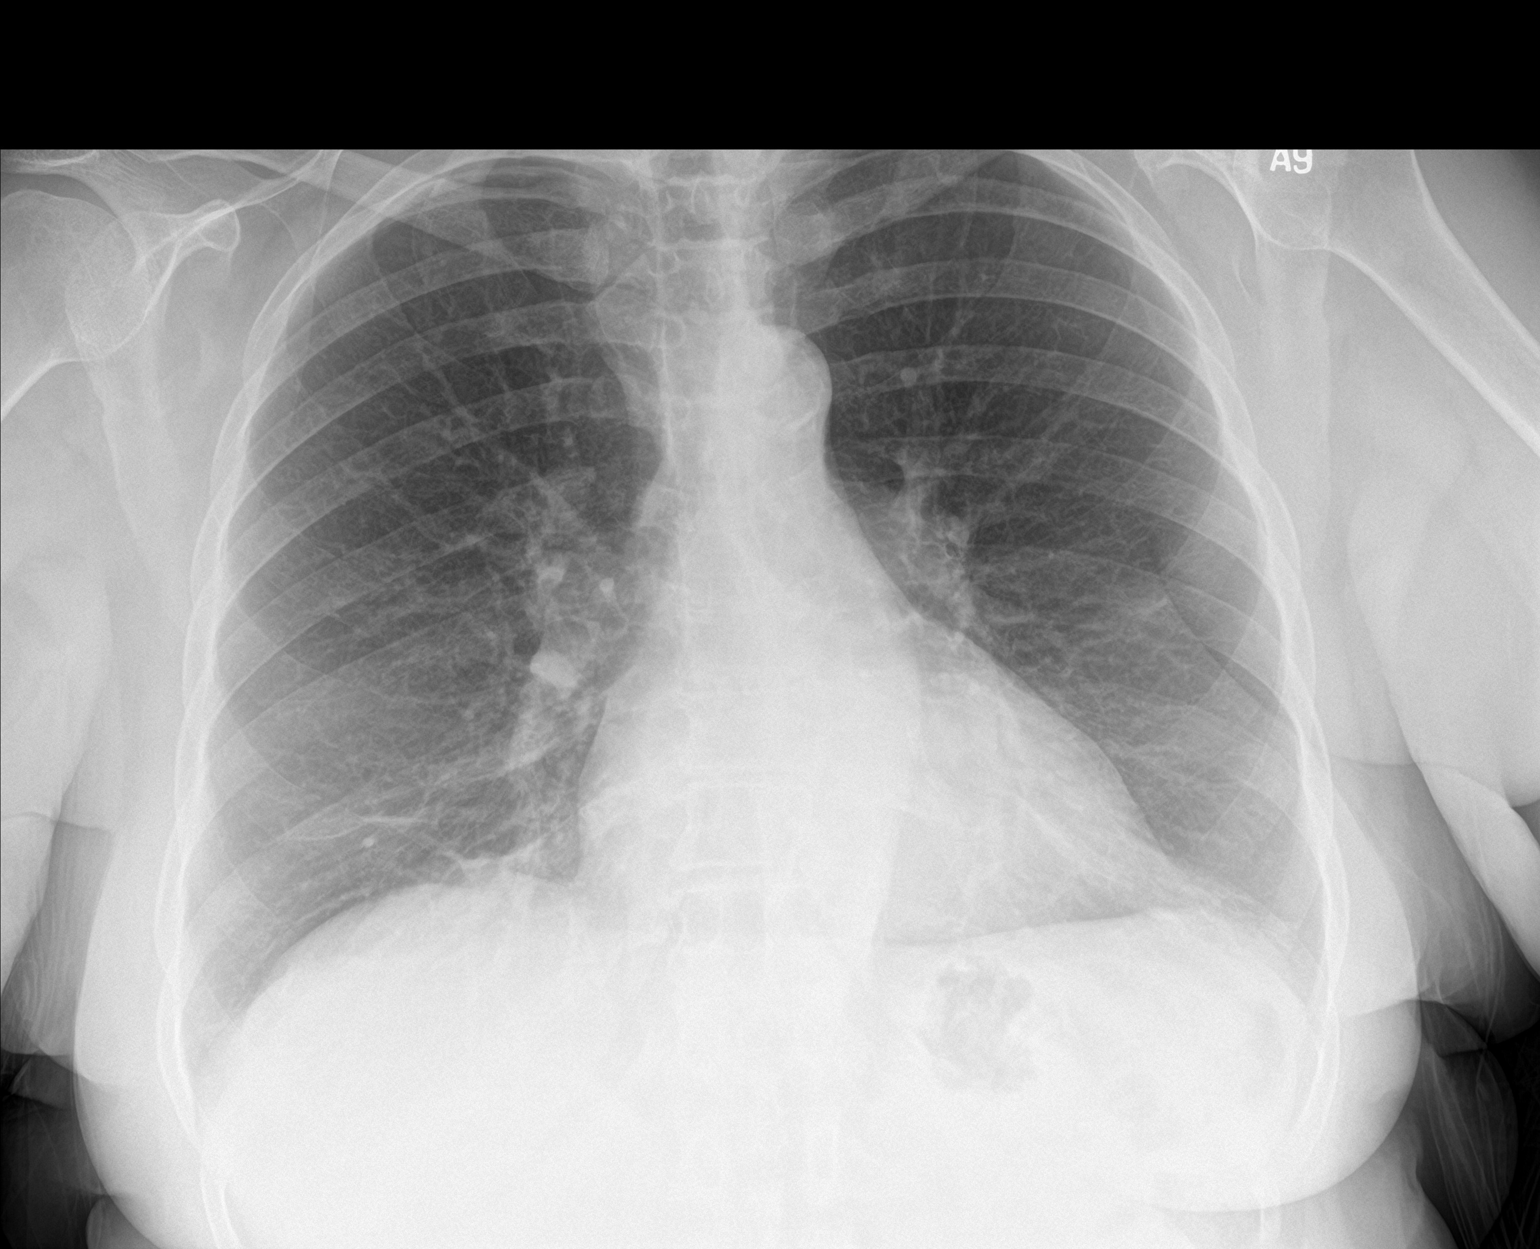

[abdomen erect]
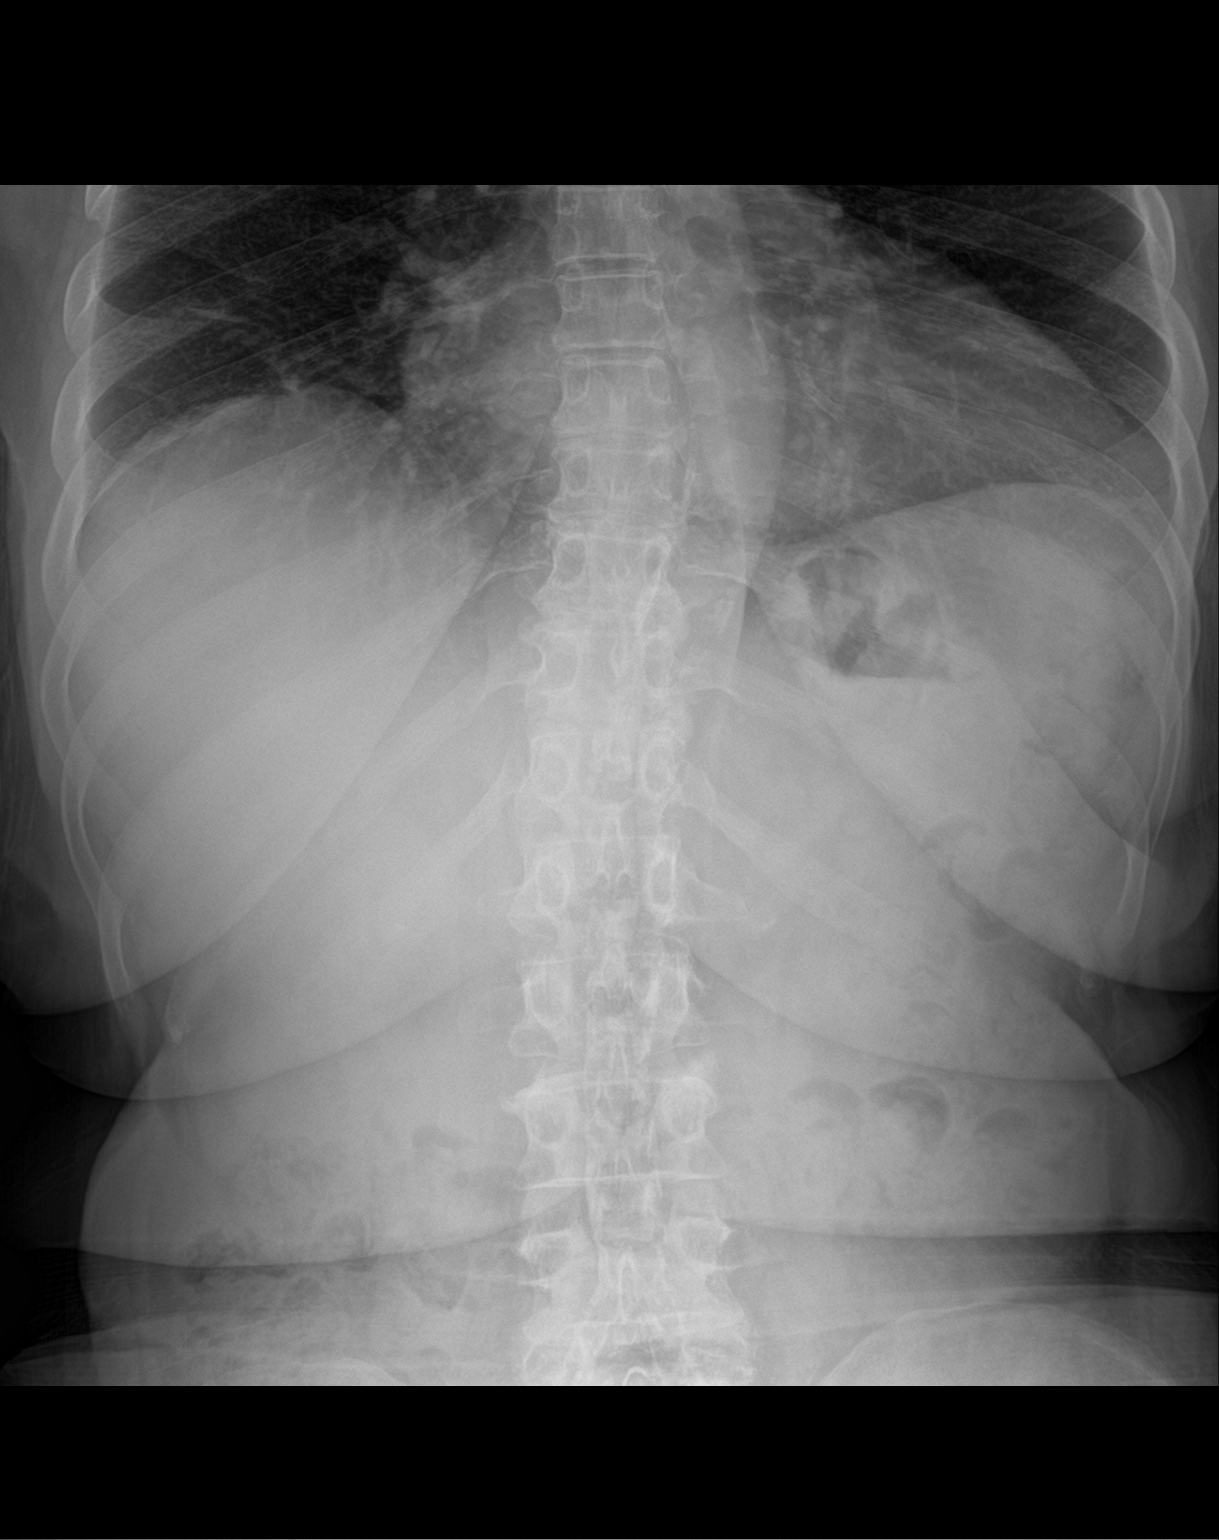

[abdomen supine]
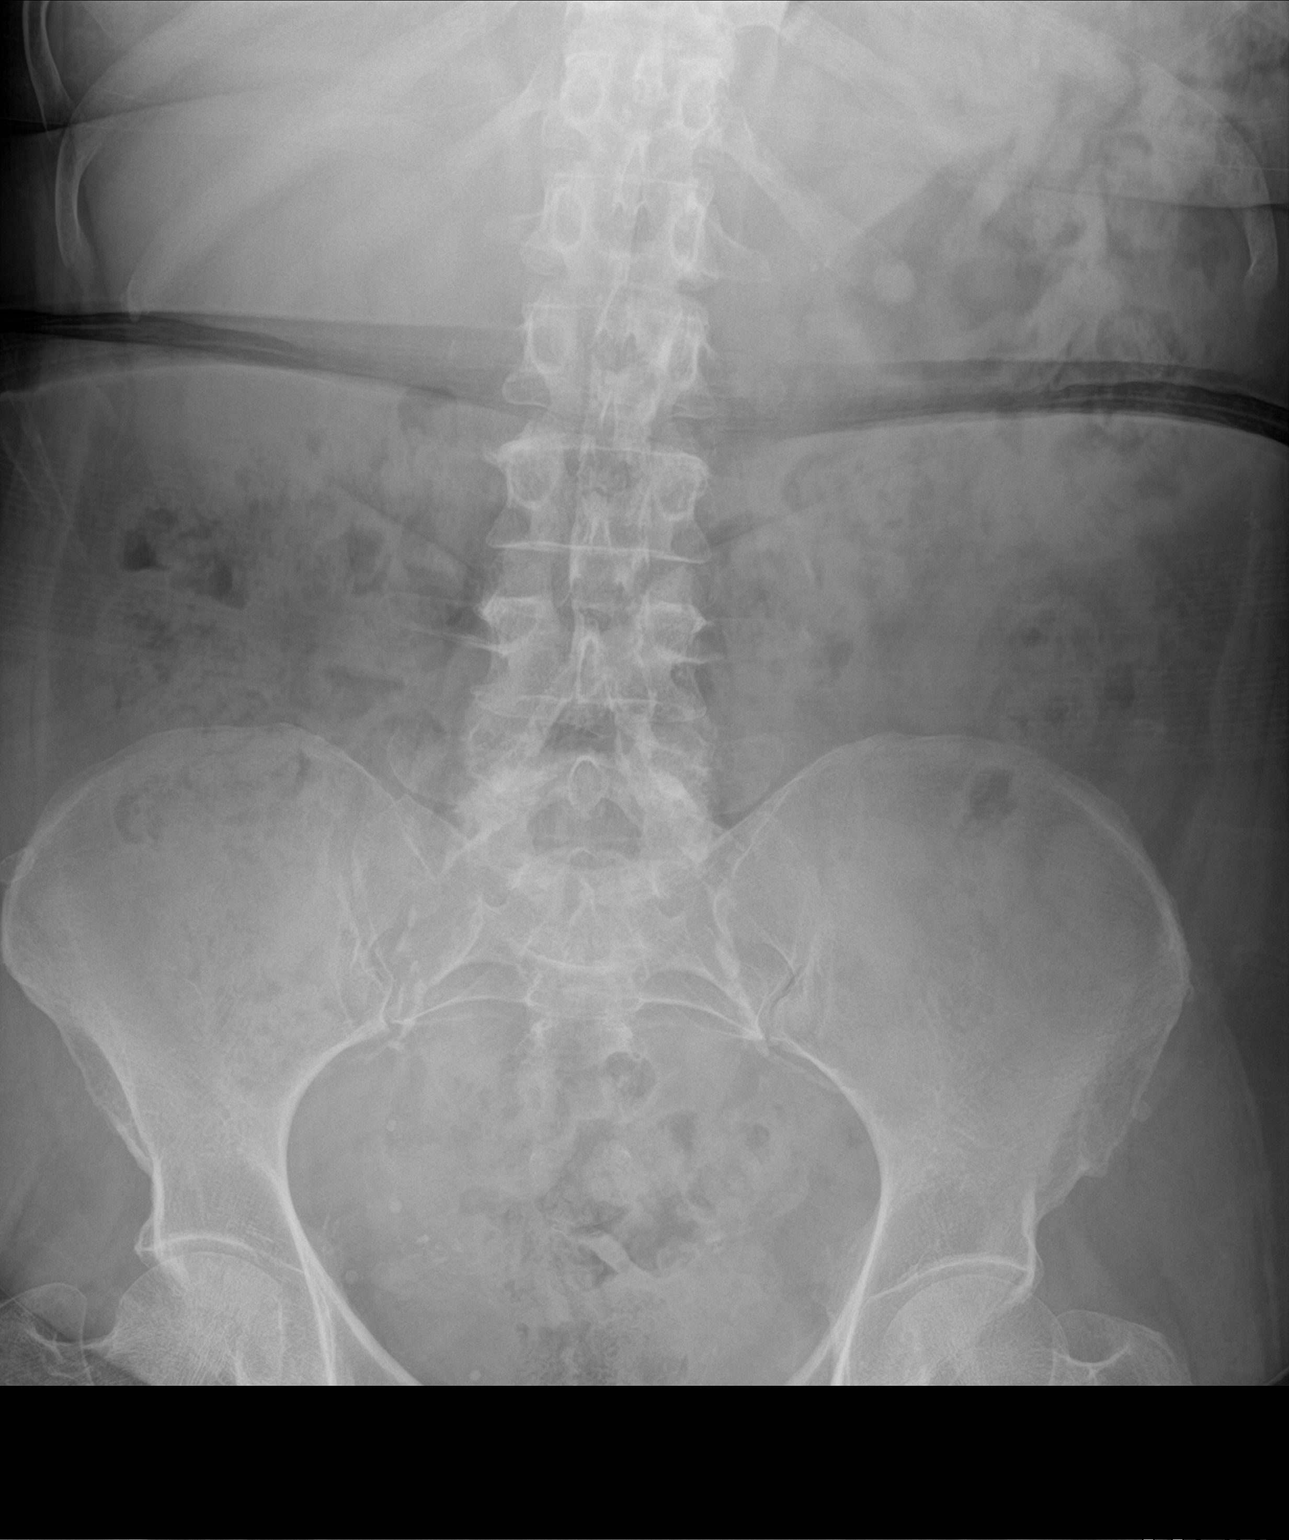

[3 of 3 positions shown; findings below may reference images not displayed]

FINDINGS: Heart size is normal. The lungs are free of focal consolidations and
pleural effusions. No pulmonary edema. Mild bibasilar atelectasis.

Moderate stool burden is noted throughout nondilated loops of colon.
No evidence for organomegaly. Scattered phleboliths in the pelvis.
There is atherosclerotic calcification of the abdominal aorta.
IMPRESSION: 1. Mild bibasilar atelectasis.
2. Significant stool burden.
3.  Aortic atherosclerosis.  ([id number])

## 2018-11-29 ENCOUNTER — Inpatient Hospital Stay (HOSPITAL_COMMUNITY): Admission: RE | Admit: 2018-11-29 | Payer: Medicare Other | Source: Ambulatory Visit

## 2018-12-03 ENCOUNTER — Ambulatory Visit: Payer: Medicare Other | Admitting: Podiatry

## 2018-12-06 ENCOUNTER — Encounter (HOSPITAL_COMMUNITY)
Admission: RE | Admit: 2018-12-06 | Discharge: 2018-12-06 | Disposition: A | Discharge status: Home / Self Care | Payer: Medicare Other | Source: Ambulatory Visit | Attending: Nephrology | Admitting: Nephrology

## 2018-12-06 VITALS — BP 179/78 | HR 72 | Temp 98.3°F | Resp 20

## 2018-12-06 DIAGNOSIS — D539 Nutritional anemia, unspecified: Secondary | ICD-10-CM

## 2018-12-06 LAB — POCT HEMOGLOBIN-HEMACUE: Hemoglobin: 12.7 g/dL (ref 12.0–15.0)

## 2018-12-06 LAB — IRON AND TIBC
IRON: 67 ug/dL (ref 28–170)
Saturation Ratios: 23 % (ref 10.4–31.8)
TIBC: 286 ug/dL (ref 250–450)
UIBC: 219 ug/dL

## 2018-12-06 LAB — FERRITIN: FERRITIN: 794 ng/mL — AB (ref 11–307)

## 2018-12-06 MED ORDER — EPOETIN ALFA 10000 UNIT/ML IJ SOLN: 20000.0000 [IU] | INTRAMUSCULAR | Status: DC

## 2018-12-06 MED ORDER — EPOETIN ALFA 20000 UNIT/ML IJ SOLN
INTRAMUSCULAR | Status: AC
  Filled 2018-12-06: qty 1

## 2018-12-07 DIAGNOSIS — D631 Anemia in chronic kidney disease: Secondary | ICD-10-CM | POA: Diagnosis not present

## 2018-12-07 DIAGNOSIS — N2581 Secondary hyperparathyroidism of renal origin: Secondary | ICD-10-CM | POA: Diagnosis not present

## 2018-12-07 DIAGNOSIS — E1122 Type 2 diabetes mellitus with diabetic chronic kidney disease: Secondary | ICD-10-CM | POA: Diagnosis not present

## 2018-12-07 DIAGNOSIS — N2889 Other specified disorders of kidney and ureter: Secondary | ICD-10-CM | POA: Diagnosis not present

## 2018-12-07 DIAGNOSIS — N184 Chronic kidney disease, stage 4 (severe): Secondary | ICD-10-CM | POA: Diagnosis not present

## 2018-12-07 DIAGNOSIS — N189 Chronic kidney disease, unspecified: Secondary | ICD-10-CM | POA: Diagnosis not present

## 2018-12-18 ENCOUNTER — Encounter (HOSPITAL_COMMUNITY): Payer: Medicare Other

## 2018-12-18 DIAGNOSIS — M25561 Pain in right knee: Secondary | ICD-10-CM | POA: Diagnosis not present

## 2018-12-18 DIAGNOSIS — R2689 Other abnormalities of gait and mobility: Secondary | ICD-10-CM | POA: Diagnosis not present

## 2018-12-20 ENCOUNTER — Encounter: Payer: Self-pay | Admitting: Internal Medicine

## 2018-12-20 ENCOUNTER — Ambulatory Visit (INDEPENDENT_AMBULATORY_CARE_PROVIDER_SITE_OTHER): Payer: Medicare Other | Admitting: Internal Medicine

## 2018-12-20 VITALS — BP 140/86 | HR 89 | Temp 98.8°F | Ht 61.0 in | Wt 221.0 lb

## 2018-12-20 DIAGNOSIS — Z0000 Encounter for general adult medical examination without abnormal findings: Secondary | ICD-10-CM | POA: Diagnosis not present

## 2018-12-20 DIAGNOSIS — E118 Type 2 diabetes mellitus with unspecified complications: Secondary | ICD-10-CM

## 2018-12-20 DIAGNOSIS — Z23 Encounter for immunization: Secondary | ICD-10-CM

## 2018-12-20 LAB — POCT GLYCOSYLATED HEMOGLOBIN (HGB A1C): Hemoglobin A1C: 10.7 % — AB (ref 4.0–5.6)

## 2018-12-20 MED ORDER — TIZANIDINE HCL 4 MG PO TABS: 4.0000 mg | ORAL_TABLET | Freq: Four times a day (QID) | ORAL | 2 refills | Status: DC | PRN

## 2018-12-20 MED ORDER — INSULIN GLARGINE 100 UNIT/ML SOLOSTAR PEN: 10.0000 [IU] | PEN_INJECTOR | Freq: Every day | SUBCUTANEOUS | 11 refills | Status: DC

## 2018-12-20 MED ORDER — TRAMADOL HCL 50 MG PO TABS: 50.0000 mg | ORAL_TABLET | Freq: Four times a day (QID) | ORAL | 2 refills | Status: DC | PRN

## 2018-12-20 NOTE — Patient Instructions (Addendum)
You had the Tdap tetanus shot today  Your A1c was high today at 10.7  Please start the Lantus 10 units per day  Plesae call in 1 week if the sugars are still > 200  Please continue all other medications as before, and refills have been done if requested - the tizanidine and tramadol  Please have the pharmacy call with any other refills you may need.  Please continue your efforts at being more active, low cholesterol diet, and weight control.  You are otherwise up to date with prevention measures today.  Please keep your appointments with your specialists as you may have planned - Dr Justin Mend with kidney doctors  Please return in 6 months, or sooner if needed, with Lab testing done 3-5 days before

## 2018-12-20 NOTE — Assessment & Plan Note (Signed)
Uncontrolled, to add lantus 10 units daily, call in 1 wk for sugar > 200

## 2018-12-20 NOTE — Assessment & Plan Note (Signed)

## 2018-12-20 NOTE — Progress Notes (Signed)
Subjective:    Patient ID: [name], female    DOB: 1946/10/03, 73 y.o.   MRN: [id number]  HPI  Here for wellness and f/u;  Overall doing ok;  Pt denies Chest pain, worsening SOB, DOE, wheezing, orthopnea, PND, worsening LE edema, palpitations, dizziness or syncope.  Pt denies neurological change such as new headache, facial or extremity weakness.  Pt denies polydipsia, polyuria, or low sugar symptoms. Pt states overall good compliance with treatment and medications, good tolerability, and has been trying to follow appropriate diet.  Pt denies worsening depressive symptoms, suicidal ideation or panic. No fever, night sweats, wt loss, loss of appetite, or other constitutional symptoms.  Pt states good ability with ADL's, has low fall risk, home safety reviewed and adequate, no other significant changes in hearing or vision, but remains severe low vision.  Did not start the lantus as recommended at last visit.  Pt continues to have recurring LBP without change in severity, bowel or bladder change, fever, wt loss,  worsening LE pain/numbness/weakness, gait change or falls. Past Medical History:  Diagnosis Date  . Anemia, iron deficiency   . Aortic atherosclerosis ([place]) 11/17/2017  . Arthritis   . CAD (coronary artery disease)   . Carotid stenosis    Carotid US (02/2014):  Bilateral ICA 40-59%; > 50% L ECA; F/u 1 year  . Cataract    removed both eyes  . Cholelithiasis 11/17/2017  . Chronic kidney disease    chronic renal failure  . DM2 (diabetes mellitus, type 2) ([place])   . GERD (gastroesophageal reflux disease)   . Glaucoma   . HLD (hyperlipidemia)   . HTN (hypertension)   . Hx of cardiovascular stress test    Lexiscan Myoview (03/24/14):  No ischemia, EF 73%, Normal  . Hypoglycemia 06/27/2017  . Mitral regurgitation   . Osteopenia 07/21/2017  . Osteoporosis   . PUD (peptic ulcer disease)   . PVD (peripheral vascular disease) ([place])   . Shortness of breath    with exertion   Past  Surgical History:  Procedure Laterality Date  . ABDOMINAL AORTOGRAM W/LOWER EXTREMITY N/A 08/13/2018   Procedure: ABDOMINAL AORTOGRAM W/LOWER EXTREMITY;  Surgeon: Waynetta Sandy, MD;  Location: [facility];  Service: Cardiovascular;  Laterality: N/A;  . ABDOMINAL HYSTERECTOMY    . APPENDECTOMY    . AV FISTULA PLACEMENT Right 01/18/2013   Procedure: ARTERIOVENOUS (AV) FISTULA CREATION;  Surgeon: Rosetta Posner, MD;  Location: [facility];  Service: Vascular;  Laterality: Right;  Ultrasound guided  . COLONOSCOPY    . CORONARY ANGIOPLASTY WITH STENT PLACEMENT    . PERIPHERAL VASCULAR INTERVENTION Bilateral 08/13/2018   Procedure: PERIPHERAL VASCULAR INTERVENTION;  Surgeon: Waynetta Sandy, MD;  Location: [facility];  Service: Cardiovascular;  Laterality: Bilateral;  . RENAL ANGIOGRAM N/A 09/16/2011   Procedure: RENAL ANGIOGRAM;  Surgeon: Sherren Mocha, MD;  Location: [facility];  Service: Cardiovascular;  Laterality: N/A;    reports that she quit smoking about 11 years ago. She has never used smokeless tobacco. She reports that she does not drink alcohol or use drugs. family history includes Diabetes in her sister and son; Glaucoma in her father; Heart disease in her father. No Known Allergies Current Outpatient Medications on File Prior to Visit  Medication Sig Dispense Refill  . acetaminophen (TYLENOL) 500 MG tablet Take 1-2 tablets (500-1,000 mg total) by mouth every 8 (eight) hours as needed for mild pain.    Marland Kitchen allopurinol (ZYLOPRIM) 100  MG tablet Take 100 mg by mouth daily.    Marland Kitchen amLODipine (NORVASC) 10 MG tablet TAKE ONE (1) TABLET EACH DAY 90 tablet 1  . aspirin 81 MG tablet Take 81 mg by mouth daily.    Marland Kitchen atorvastatin (LIPITOR) 80 MG tablet TAKE 1 TABLET (80 MG TOTAL) BY MOUTH DAILY AT 6 PM. 90 tablet 3  . BD PEN NEEDLE NANO U/F 32G X 4 MM MISC USE DAILY WITH LANTUS SOLASTAR 100 each 11  . blood glucose meter kit and supplies KIT Dispense based on patient  and insurance preference. Use up to three times daily as directed. (FOR ICD-9 250.00, 250.01). 1 each 0  . calcitRIOL (ROCALTROL) 0.25 MCG capsule Take 0.25 mcg by mouth daily.    . clindamycin (CLEOCIN) 300 MG capsule Take 1 capsule (300 mg total) by mouth 3 (three) times daily. 30 capsule 0  . clopidogrel (PLAVIX) 75 MG tablet TAKE 1 TABLET BY MOUTH EVERY DAY 90 tablet 1  . Cyanocobalamin (B-12 PO) Take 1 tablet by mouth daily.    . diclofenac sodium (VOLTAREN) 1 % GEL Apply 4 g topically 4 (four) times daily as needed. 400 g 11  . ferrous sulfate 325 (65 FE) MG tablet TAKE 1 TABLET (325 MG TOTAL) BY MOUTH 2 (TWO) TIMES DAILY. 180 tablet 1  . furosemide (LASIX) 80 MG tablet Take 0.5 tablets (40 mg total) by mouth 2 (two) times daily.    Marland Kitchen glucose blood test strip ACCU CHECK - Use as instructed three time daily  E11.9 300 each 12  . hydrOXYzine (ATARAX/VISTARIL) 10 MG tablet Take 1 tablet (10 mg total) by mouth 3 (three) times daily as needed for itching. 30 tablet 0  . insulin lispro (HUMALOG KWIKPEN) 100 UNIT/ML KwikPen INJECT 0-0.9 ml (0-9 units) into the skin 3 times per day 15 pen 1  . Lancets MISC Use as directed three times daily with meals 100 each 3  . metoprolol tartrate (LOPRESSOR) 100 MG tablet TAKE 1 TABLET BY MOUTH TWICE A DAY 180 tablet 2  . nitroGLYCERIN (NITROSTAT) 0.4 MG SL tablet Place 1 tablet (0.4 mg total) under the tongue every 5 (five) minutes as needed for chest pain. Reported on 04/06/2016 25 tablet 5  . omeprazole (PRILOSEC) 20 MG capsule TAKE 1 CAPSULE (20 MG TOTAL) BY MOUTH DAILY. 90 capsule 0  . polyethylene glycol powder (GLYCOLAX/MIRALAX) powder Take 17 gm by mouth daily 3350 g 11  . Potassium Chloride ER 20 MEQ TBCR Take 20 mEq by mouth daily.   6  . risedronate (ACTONEL) 150 MG tablet Take 1 tablet (150 mg total) by mouth every 30 (thirty) days. First Tuesday 3 tablet 3  . Tetrahydrozoline HCl (VISINE OP) Place 2 drops into both eyes daily as needed (DRY EYE).      Marland Kitchen thiamine (VITAMIN B-1) 50 MG tablet Take 1 tablet (50 mg total) by mouth daily. 90 tablet 1  . timolol (BETIMOL) 0.5 % ophthalmic solution Place 1 drop into both eyes daily.     Marland Kitchen zolpidem (AMBIEN) 5 MG tablet Take 1 tablet (5 mg total) by mouth at bedtime as needed for sleep. 30 tablet 5   No current facility-administered medications on file prior to visit.    Review of Systems Constitutional: Negative for other unusual diaphoresis, sweats, appetite or weight changes HENT: Negative for other worsening hearing loss, ear pain, facial swelling, mouth sores or neck stiffness.   Eyes: Negative for other worsening pain, redness or other visual disturbance.  Respiratory: Negative for other stridor or swelling Cardiovascular: Negative for other palpitations or other chest pain  Gastrointestinal: Negative for worsening diarrhea or loose stools, blood in stool, distention or other pain Genitourinary: Negative for hematuria, flank pain or other change in urine volume.  Musculoskeletal: Negative for myalgias or other joint swelling.  Skin: Negative for other color change, or other wound or worsening drainage.  Neurological: Negative for other syncope or numbness. Hematological: Negative for other adenopathy or swelling Psychiatric/Behavioral: Negative for hallucinations, other worsening agitation, SI, self-injury, or new decreased concentration All other system neg per pt    Objective:   Physical Exam BP 140/86   Pulse 89   Temp 98.8 F (37.1 C) (Oral)   Ht '5\' 1"'$  (1.549 m)   Wt 221 lb (100.2 kg)   SpO2 95%   BMI 41.76 kg/m  VS noted, non toxic Constitutional: Pt is oriented to person, place, and time. Appears well-developed and well-nourished, in no significant distress and comfortable Head: Normocephalic and atraumatic  Eyes: Conjunctivae and EOM are normal. Pupils are equal, round, and reactive to light Right Ear: External ear normal without discharge Left Ear: External ear normal  without discharge Nose: Nose without discharge or deformity Mouth/Throat: Oropharynx is without other ulcerations and moist  Neck: Normal range of motion. Neck supple. No JVD present. No tracheal deviation present or significant neck LA or mass Cardiovascular: Normal rate, regular rhythm, normal heart sounds and intact distal pulses.   Pulmonary/Chest: WOB normal and breath sounds without rales or wheezing  Abdominal: Soft. Bowel sounds are normal. NT. No HSM  Musculoskeletal: Normal range of motion. Exhibits no edema Lymphadenopathy: Has no other cervical adenopathy.  Neurological: Pt is alert and oriented to person, place, and time. Pt has normal reflexes. No cranial nerve deficit. Motor grossly intact, Gait intact Skin: Skin is warm and dry. No rash noted or new ulcerations Psychiatric:  Has normal mood and affect. Behavior is normal without agitation No other exam findings  POCT glycosylated hemoglobin (Hb A1C)  Order: [id number]  Status:  Final result Visible to patient:  No (Not Released) Dx:  Diabetes mellitus with complication (...   Ref Range & Units 15:02 (12/20/18) 35moago (06/14/18) 164yrgo (11/23/17) [id number] (06/08/17) 69yr40yr (03/09/17) 74yr 39yr(10/06/16) 51yr a70yr5/25/16)  Hemoglobin A1C 4.0 - 5.6 % 10.7Abnormal   7.9High  R, CM 6.9High  R, CM 5.4 R, CM 5.9 R, CM 5.6 R, CM 6.1 R, CM           Assessment & Plan:

## 2018-12-21 ENCOUNTER — Encounter (HOSPITAL_COMMUNITY)
Admission: RE | Admit: 2018-12-21 | Discharge: 2018-12-21 | Disposition: A | Discharge status: Home / Self Care | Payer: Medicare Other | Source: Ambulatory Visit | Attending: Nephrology | Admitting: Nephrology

## 2018-12-21 VITALS — BP 128/107 | HR 98 | Temp 98.7°F | Resp 20

## 2018-12-21 DIAGNOSIS — D539 Nutritional anemia, unspecified: Secondary | ICD-10-CM | POA: Diagnosis not present

## 2018-12-21 LAB — POCT HEMOGLOBIN-HEMACUE: Hemoglobin: 11.5 g/dL — ABNORMAL LOW (ref 12.0–15.0)

## 2018-12-21 MED ORDER — EPOETIN ALFA 10000 UNIT/ML IJ SOLN: 20000.0000 [IU] | INTRAMUSCULAR | Status: DC

## 2018-12-21 MED ORDER — EPOETIN ALFA 20000 UNIT/ML IJ SOLN
INTRAMUSCULAR | Status: AC
  Administered 2018-12-21: 20000 [IU]
  Filled 2018-12-21: qty 1

## 2018-12-27 ENCOUNTER — Encounter (HOSPITAL_COMMUNITY): Payer: Medicare Other

## 2018-12-28 ENCOUNTER — Other Ambulatory Visit (HOSPITAL_COMMUNITY): Payer: Self-pay | Admitting: Nephrology

## 2018-12-28 DIAGNOSIS — M7989 Other specified soft tissue disorders: Secondary | ICD-10-CM

## 2018-12-31 ENCOUNTER — Ambulatory Visit (HOSPITAL_COMMUNITY)
Admission: RE | Admit: 2018-12-31 | Discharge: 2018-12-31 | Disposition: A | Discharge status: Home / Self Care | Payer: Medicare Other | Source: Ambulatory Visit | Attending: Internal Medicine | Admitting: Internal Medicine

## 2018-12-31 ENCOUNTER — Other Ambulatory Visit: Payer: Self-pay

## 2018-12-31 DIAGNOSIS — M7989 Other specified soft tissue disorders: Secondary | ICD-10-CM | POA: Diagnosis not present

## 2018-12-31 DIAGNOSIS — L98499 Non-pressure chronic ulcer of skin of other sites with unspecified severity: Secondary | ICD-10-CM

## 2018-12-31 DIAGNOSIS — I739 Peripheral vascular disease, unspecified: Secondary | ICD-10-CM

## 2018-12-31 DIAGNOSIS — I771 Stricture of artery: Secondary | ICD-10-CM

## 2018-12-31 DIAGNOSIS — R6889 Other general symptoms and signs: Secondary | ICD-10-CM

## 2018-12-31 DIAGNOSIS — M25562 Pain in left knee: Secondary | ICD-10-CM

## 2018-12-31 DIAGNOSIS — M25561 Pain in right knee: Secondary | ICD-10-CM

## 2018-12-31 NOTE — Progress Notes (Signed)
VASCULAR LAB PRELIMINARY  PRELIMINARY  PRELIMINARY  PRELIMINARY  Bilateral upper extremity venous duplex completed.    Preliminary report:  See CV Proc for results  [facility] with results  Eletha Culbertson, RVT 12/31/2018, 10:49 AM

## 2019-01-02 ENCOUNTER — Ambulatory Visit
Admission: RE | Admit: 2019-01-02 | Discharge: 2019-01-02 | Disposition: A | Discharge status: Home / Self Care | Payer: Medicare Other | Source: Ambulatory Visit | Attending: Nephrology | Admitting: Nephrology

## 2019-01-02 ENCOUNTER — Other Ambulatory Visit: Payer: Self-pay | Admitting: Nephrology

## 2019-01-02 ENCOUNTER — Other Ambulatory Visit: Payer: Self-pay

## 2019-01-02 DIAGNOSIS — N184 Chronic kidney disease, stage 4 (severe): Secondary | ICD-10-CM

## 2019-01-02 DIAGNOSIS — I25119 Atherosclerotic heart disease of native coronary artery with unspecified angina pectoris: Secondary | ICD-10-CM

## 2019-01-02 DIAGNOSIS — I739 Peripheral vascular disease, unspecified: Secondary | ICD-10-CM

## 2019-01-03 ENCOUNTER — Encounter (HOSPITAL_COMMUNITY): Payer: Medicare Other

## 2019-01-04 ENCOUNTER — Ambulatory Visit (HOSPITAL_COMMUNITY)
Admission: RE | Admit: 2019-01-04 | Discharge: 2019-01-04 | Disposition: A | Discharge status: Home / Self Care | Payer: Medicare Other | Source: Ambulatory Visit | Attending: Vascular Surgery | Admitting: Vascular Surgery

## 2019-01-04 ENCOUNTER — Other Ambulatory Visit: Payer: Self-pay

## 2019-01-04 ENCOUNTER — Ambulatory Visit: Payer: Medicare Other | Admitting: Family

## 2019-01-04 ENCOUNTER — Ambulatory Visit (INDEPENDENT_AMBULATORY_CARE_PROVIDER_SITE_OTHER)
Admission: RE | Admit: 2019-01-04 | Discharge: 2019-01-04 | Disposition: A | Discharge status: Home / Self Care | Payer: Medicare Other | Source: Ambulatory Visit | Attending: Vascular Surgery | Admitting: Vascular Surgery

## 2019-01-04 ENCOUNTER — Encounter: Payer: Self-pay | Admitting: Family

## 2019-01-04 VITALS — BP 184/96 | HR 85 | Temp 96.9°F | Resp 16 | Ht 61.0 in | Wt 223.0 lb

## 2019-01-04 DIAGNOSIS — I779 Disorder of arteries and arterioles, unspecified: Secondary | ICD-10-CM

## 2019-01-04 DIAGNOSIS — I739 Peripheral vascular disease, unspecified: Secondary | ICD-10-CM | POA: Insufficient documentation

## 2019-01-04 DIAGNOSIS — N184 Chronic kidney disease, stage 4 (severe): Secondary | ICD-10-CM

## 2019-01-04 DIAGNOSIS — M25562 Pain in left knee: Secondary | ICD-10-CM | POA: Insufficient documentation

## 2019-01-04 DIAGNOSIS — I6523 Occlusion and stenosis of bilateral carotid arteries: Secondary | ICD-10-CM

## 2019-01-04 DIAGNOSIS — R6889 Other general symptoms and signs: Secondary | ICD-10-CM | POA: Insufficient documentation

## 2019-01-04 DIAGNOSIS — I872 Venous insufficiency (chronic) (peripheral): Secondary | ICD-10-CM

## 2019-01-04 DIAGNOSIS — I771 Stricture of artery: Secondary | ICD-10-CM

## 2019-01-04 DIAGNOSIS — L98499 Non-pressure chronic ulcer of skin of other sites with unspecified severity: Secondary | ICD-10-CM | POA: Insufficient documentation

## 2019-01-04 DIAGNOSIS — M25561 Pain in right knee: Secondary | ICD-10-CM

## 2019-01-04 NOTE — Progress Notes (Signed)
[facility] [place]   CC: Follow up peripheral artery occlusive disease  History of Present Illness [name] is a 73 y.o. female who is status post bilateral common iliac artery stenting on 08-13-18 by Dr. 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  She is walking better.   At her visit in 10-05-18, her stents were patent although there appeared to be stenosis of the right common iliac artery which Dr. Donzetta Matters thought was residual from previous as the angiogram did not demonstrate renal stenosis but pressure gradient was 50 mmHg down to 0 after stenting.  She has chronic kidney disease which is somewhat prohibitive to have further angiography. Last serum creatinine and GFR result on file was 2.21 and 27.9; she sees [facility].  Dr. Donzetta Matters last evaluated pt on 10-05-18. At that time Dr. Donzetta Matters advised pt to continue on aspirin and Plavix, follow-up in 3 to 4 months with repeat studies. Dr. Donzetta Matters recommended compression stockings bilaterally.  Pt denies any known hx of stroke or TIA. She states that the swelling in her lower legs resolves by morning with overnight elevation.    Diabetic: Yes, 10.7 A1C on 12-20-18, uncontrolled Tobacco use: former smoker, quit in 2009, started at age 54 years   Pt meds include: Statin :Yes Betablocker: Yes ASA: Yes Other anticoagulants/antiplatelets: Plavix  Past Medical History:  Diagnosis Date  . Anemia, iron deficiency   . Aortic atherosclerosis ([place]) 11/17/2017  . Arthritis   . CAD (coronary artery disease)   . Carotid stenosis    Carotid US (02/2014):  Bilateral ICA 40-59%; > 50% L ECA; F/u 1 year  . Cataract    removed both eyes  . Cholelithiasis 11/17/2017  . Chronic kidney disease    chronic renal failure  . DM2 (diabetes mellitus, type 2) ([place])   . GERD (gastroesophageal reflux disease)   . Glaucoma   . HLD (hyperlipidemia)   . HTN  (hypertension)   . Hx of cardiovascular stress test    Lexiscan Myoview (03/24/14):  No ischemia, EF 73%, Normal  . Hypoglycemia 06/27/2017  . Mitral regurgitation   . Osteopenia 07/21/2017  . Osteoporosis   . PUD (peptic ulcer disease)   . PVD (peripheral vascular disease) ([place])   . Shortness of breath    with exertion    Social History Social History   Tobacco Use  . Smoking status: Former Smoker    Last attempt to quit: 10/25/2007    Years since quitting: 11.2  . Smokeless tobacco: Never Used  . Tobacco comment: Stopped 1.5 yrs ago January 2009  Substance Use Topics  . Alcohol use: No  . Drug use: No    Family History Family History  Problem Relation Age of Onset  . Heart disease Father   . Glaucoma Father   . Diabetes Sister   . Diabetes Son   . Colon cancer Neg Hx   . Colon polyps Neg Hx   . Esophageal cancer Neg Hx   . Rectal cancer Neg Hx   . Stomach cancer Neg Hx     Past Surgical History:  Procedure Laterality Date  . ABDOMINAL AORTOGRAM W/LOWER EXTREMITY N/A 08/13/2018   Procedure: ABDOMINAL AORTOGRAM W/LOWER EXTREMITY;  Surgeon: Waynetta Sandy, MD;  Location: [facility];  Service: Cardiovascular;  Laterality: N/A;  . ABDOMINAL HYSTERECTOMY    . APPENDECTOMY    . AV FISTULA PLACEMENT Right 01/18/2013  Procedure: ARTERIOVENOUS (AV) FISTULA CREATION;  Surgeon: Rosetta Posner, MD;  Location: [facility];  Service: Vascular;  Laterality: Right;  Ultrasound guided  . COLONOSCOPY    . CORONARY ANGIOPLASTY WITH STENT PLACEMENT    . PERIPHERAL VASCULAR INTERVENTION Bilateral 08/13/2018   Procedure: PERIPHERAL VASCULAR INTERVENTION;  Surgeon: Waynetta Sandy, MD;  Location: [facility];  Service: Cardiovascular;  Laterality: Bilateral;  . RENAL ANGIOGRAM N/A 09/16/2011   Procedure: RENAL ANGIOGRAM;  Surgeon: Sherren Mocha, MD;  Location: [facility];  Service: Cardiovascular;  Laterality: N/A;    No Known Allergies  Current  Outpatient Medications  Medication Sig Dispense Refill  . acetaminophen (TYLENOL) 500 MG tablet Take 1-2 tablets (500-1,000 mg total) by mouth every 8 (eight) hours as needed for mild pain.    Marland Kitchen allopurinol (ZYLOPRIM) 100 MG tablet Take 100 mg by mouth daily.    Marland Kitchen amLODipine (NORVASC) 10 MG tablet TAKE ONE (1) TABLET EACH DAY 90 tablet 1  . aspirin 81 MG tablet Take 81 mg by mouth daily.    Marland Kitchen atorvastatin (LIPITOR) 80 MG tablet TAKE 1 TABLET (80 MG TOTAL) BY MOUTH DAILY AT 6 PM. 90 tablet 3  . BD PEN NEEDLE NANO U/F 32G X 4 MM MISC USE DAILY WITH LANTUS SOLASTAR 100 each 11  . blood glucose meter kit and supplies KIT Dispense based on patient and insurance preference. Use up to three times daily as directed. (FOR ICD-9 250.00, 250.01). 1 each 0  . calcitRIOL (ROCALTROL) 0.25 MCG capsule Take 0.25 mcg by mouth daily.    . clopidogrel (PLAVIX) 75 MG tablet TAKE 1 TABLET BY MOUTH EVERY DAY 90 tablet 1  . Cyanocobalamin (B-12 PO) Take 1 tablet by mouth daily.    . diclofenac sodium (VOLTAREN) 1 % GEL Apply 4 g topically 4 (four) times daily as needed. 400 g 11  . ferrous sulfate 325 (65 FE) MG tablet TAKE 1 TABLET (325 MG TOTAL) BY MOUTH 2 (TWO) TIMES DAILY. 180 tablet 1  . furosemide (LASIX) 80 MG tablet Take 0.5 tablets (40 mg total) by mouth 2 (two) times daily.    Marland Kitchen glucose blood test strip ACCU CHECK - Use as instructed three time daily  E11.9 300 each 12  . hydrOXYzine (ATARAX/VISTARIL) 10 MG tablet Take 1 tablet (10 mg total) by mouth 3 (three) times daily as needed for itching. 30 tablet 0  . Insulin Glargine (LANTUS SOLOSTAR) 100 UNIT/ML Solostar Pen Inject 10 Units into the skin daily. 5 pen 11  . insulin lispro (HUMALOG KWIKPEN) 100 UNIT/ML KwikPen INJECT 0-0.9 ml (0-9 units) into the skin 3 times per day 15 pen 1  . Lancets MISC Use as directed three times daily with meals 100 each 3  . metoprolol tartrate (LOPRESSOR) 100 MG tablet TAKE 1 TABLET BY MOUTH TWICE A DAY 180 tablet 2  .  omeprazole (PRILOSEC) 20 MG capsule TAKE 1 CAPSULE (20 MG TOTAL) BY MOUTH DAILY. 90 capsule 0  . polyethylene glycol powder (GLYCOLAX/MIRALAX) powder Take 17 gm by mouth daily 3350 g 11  . Potassium Chloride ER 20 MEQ TBCR Take 20 mEq by mouth daily.   6  . risedronate (ACTONEL) 150 MG tablet Take 1 tablet (150 mg total) by mouth every 30 (thirty) days. First Tuesday 3 tablet 3  . Tetrahydrozoline HCl (VISINE OP) Place 2 drops into both eyes daily as needed (DRY EYE).    Marland Kitchen thiamine (VITAMIN B-1) 50 MG tablet Take 1 tablet (50 mg  total) by mouth daily. 90 tablet 1  . timolol (BETIMOL) 0.5 % ophthalmic solution Place 1 drop into both eyes daily.     Marland Kitchen tiZANidine (ZANAFLEX) 4 MG tablet Take 1 tablet (4 mg total) by mouth every 6 (six) hours as needed for muscle spasms. 60 tablet 2  . traMADol (ULTRAM) 50 MG tablet Take 1 tablet (50 mg total) by mouth every 6 (six) hours as needed. 120 tablet 2  . zolpidem (AMBIEN) 5 MG tablet Take 1 tablet (5 mg total) by mouth at bedtime as needed for sleep. 30 tablet 5  . clindamycin (CLEOCIN) 300 MG capsule Take 1 capsule (300 mg total) by mouth 3 (three) times daily. (Patient not taking: Reported on 01/04/2019) 30 capsule 0  . nitroGLYCERIN (NITROSTAT) 0.4 MG SL tablet Place 1 tablet (0.4 mg total) under the tongue every 5 (five) minutes as needed for chest pain. Reported on 04/06/2016 (Patient not taking: Reported on 01/04/2019) 25 tablet 5   No current facility-administered medications for this visit.     ROS: See HPI for pertinent positives and negatives.   Physical Examination  Vitals:   01/04/19 1028 01/04/19 1033  BP: (!) 197/101 (!) 184/96  Pulse: 85 85  Resp: 16   Temp: (!) 96.9 F (36.1 C)   TempSrc: Oral   SpO2: 94%   Weight: 223 lb (101.2 kg)   Height: '5\' 1"'$  (1.549 m)    Body mass index is 42.14 kg/m.  General: A&O x 3, WDWN, morbidly obese female. Gait: slow, steady, using cane HENT: No gross abnormalities. Large neck Eyes:  PERRLA. Pulmonary: Respirations are non labored, CTAB, good air movement in all fields Cardiac: regular rhythm, no detected murmur.         Carotid Bruits Right Left   Positive Positive   Radial pulses are 2+ palpable bilaterally   Adominal aortic pulse is not palpable                         VASCULAR EXAM: Extremities without ischemic changes, without Gangrene; without open wounds. 1+ pretibial pitting edema bilaterally                                                                                                           LE Pulses Right Left       FEMORAL  not palpable (morbidly obese)  not palpable        POPLITEAL  not palpable   not palpable       POSTERIOR TIBIAL  not palpable   not palpable        DORSALIS PEDIS      ANTERIOR TIBIAL not palpable  not palpable    Abdomen: soft, NT, no palpable masses. Skin: no rashes, no cellulitis, no ulcers noted. Musculoskeletal: no muscle wasting or atrophy.  Neurologic: A&O X 3; appropriate affect, Sensation is normal; MOTOR FUNCTION:  moving all extremities equally, motor strength 5/5 throughout. Speech is fluent/normal. CN 2-12 intact. Psychiatric: Thought content is normal, mood appropriate for clinical situation.  ASSESSMENT: [name] is a 73 y.o. female who is status post bilateral common iliac artery stenting on 08-13-18 by Dr. 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   There are no signs of ischemia in her feet or legs. She walks with a cane, her walking seems limited by her multiple medical problems and not claudication.     - Bilateral carotid bruits, no hx of stroke or TIA: most recent carotid duplex result on file is from 2017, shows 40-59% bilateral ICA stenosis and left ECA stenosis.   Her atherosclerotic risk factors include uncontrolled DM, CKD, morbid obesity, former smoker, and CAD.  I advised pt to work closely with her  PCP to get her blood sugar under as good control as possible.  She is under the care of a nephrologist.   DATA  01-04-19 Abdominal Aorta Findings: +-------------+-------+----------+----------+--------+--------+--------+ Location     AP (cm)Trans (cm)PSV (cm/s)WaveformThrombusComments +-------------+-------+----------+----------+--------+--------+--------+ Distal                        236                                +-------------+-------+----------+----------+--------+--------+--------+ RT CIA Mid                    74                                 +-------------+-------+----------+----------+--------+--------+--------+ RT CIA Distal                 119                                +-------------+-------+----------+----------+--------+--------+--------+ RT EIA Mid                    155                                +-------------+-------+----------+----------+--------+--------+--------+ LT CIA Distal                 84                                 +-------------+-------+----------+----------+--------+--------+--------+ LT EIA Mid                    135                                +-------------+-------+----------+----------+--------+--------+--------+ Summary: Stenosis: Technically limited exam due to body habitus and bowel gas. Patent bilateral CIA stents with elevated velocities noted in the distal aorta/right common iliac artery in the >50% stenosis range.   ABI (Date: 01/04/2019): ABI Findings: +---------+------------------+-----+----------+--------+ Right    Rt Pressure (mmHg)IndexWaveform  Comment  +---------+------------------+-----+----------+--------+ Brachial 178                                       +---------+------------------+-----+----------+--------+ PTA      255  1.43 biphasic           +---------+------------------+-----+----------+--------+ DP       132                0.74 monophasic         +---------+------------------+-----+----------+--------+ Great Toe59                0.33 Abnormal           +---------+------------------+-----+----------+--------+  +---------+------------------+-----+--------+-------+ Left     Lt Pressure (mmHg)IndexWaveformComment +---------+------------------+-----+--------+-------+ Brachial 176                                    +---------+------------------+-----+--------+-------+ PTA      103               0.58 biphasic        +---------+------------------+-----+--------+-------+ DP       97                0.54 biphasic        +---------+------------------+-----+--------+-------+ Great Toe71                0.40 Abnormal        +---------+------------------+-----+--------+-------+  +-------+-----------+-----------+------------+------------+ ABI/TBIToday's ABIToday's TBIPrevious ABIPrevious TBI +-------+-----------+-----------+------------+------------+ Right  [place]         0.33       [place]          0.61         +-------+-----------+-----------+------------+------------+ Left   0.58       0.40       0.74        0.73         +-------+-----------+-----------+------------+------------+  Right ABIs appear essentially unchanged compared to prior study on 10/03/2018. Left ABIs and TBIs appear decreased compared to prior study on 10/03/2018.   Summary: Right: Resting right ankle-brachial index indicates noncompressible right lower extremity arteries.The right toe-brachial index is abnormal. RT great toe pressure = 59 mmHg. PPG tracings appear dampened.  Left: Resting left ankle-brachial index indicates moderate left lower extremity arterial disease. The left toe-brachial index is abnormal. LT Great toe pressure = 71 mmHg. PPG tracings appear dampened.   Carotid Duplex (04-05-16): Heterogeneous plaque, bilaterally. Stable 40-59% bilateral ICA stenosis. >50% LECA  stenosis. Normal subclavian arteries, bilaterally. Patent vertebral arteries with antegrade flow.   PLAN:  Based on the patient's vascular studies and examination, pt will return to clinic in 3 months with ABI's, bilateral aortoiliac duplex, and carotid duplex.  I advised pot to notify us if she develops concerns re the circulation in her feet or legs.  Daily seated leg exercises discussed and demonstrated.   I discussed in depth with the patient the nature of atherosclerosis, and emphasized the importance of maximal medical management including strict control of blood pressure, blood glucose, and lipid levels, obtaining regular exercise, and continued cessation of smoking.  The patient is aware that without maximal medical management the underlying atherosclerotic disease process will progress, limiting the benefit of any interventions.  The patient was given information about PAD including signs, symptoms, treatment, what symptoms should prompt the patient to seek immediate medical care, and risk reduction measures to take.  Clemon Chambers, RN, MSN, FNP-C [facility] Phone: [phone]  Clinic MD: Donzetta Matters  01/04/19 11:27 AM

## 2019-01-04 NOTE — Patient Instructions (Signed)
Before your next abdominal ultrasound:  Avoid gas forming foods and beverages the day before the test.   Take two Extra-Strength Gas-X capsules at bedtime the night before the test. Take another two Extra-Strength Gas-X capsules in the middle of the night if you get up to the restroom, if not, first thing in the morning with water.  Do not chew gum.     Peripheral Vascular Disease  Peripheral vascular disease (PVD) is a disease of the blood vessels that are not part of your heart and brain. A simple term for PVD is poor circulation. In most cases, PVD narrows the blood vessels that carry blood from your heart to the rest of your body. This can reduce the supply of blood to your arms, legs, and internal organs, like your stomach or kidneys. However, PVD most often affects a person's lower legs and feet. Without treatment, PVD tends to get worse. PVD can also lead to acute ischemic limb. This is when an arm or leg suddenly cannot get enough blood. This is a medical emergency. Follow these instructions at home: Lifestyle  Do not use any products that contain nicotine or tobacco, such as cigarettes and e-cigarettes. If you need help quitting, ask your doctor.  Lose weight if you are overweight. Or, stay at a healthy weight as told by your doctor.  Eat a diet that is low in fat and cholesterol. If you need help, ask your doctor.  Exercise regularly. Ask your doctor for activities that are right for you. General instructions  Take over-the-counter and prescription medicines only as told by your doctor.  Take good care of your feet: ? Wear comfortable shoes that fit well. ? Check your feet often for any cuts or sores.  Keep all follow-up visits as told by your doctor This is important. Contact a doctor if:  You have cramps in your legs when you walk.  You have leg pain when you are at rest.  You have coldness in a leg or foot.  Your skin changes.  You are unable to get or have an  erection (erectile dysfunction).  You have cuts or sores on your feet that do not heal. Get help right away if:  Your arm or leg turns cold, numb, and blue.  Your arms or legs become red, warm, swollen, painful, or numb.  You have chest pain.  You have trouble breathing.  You suddenly have weakness in your face, arm, or leg.  You become very confused or you cannot speak.  You suddenly have a very bad headache.  You suddenly cannot see. Summary  Peripheral vascular disease (PVD) is a disease of the blood vessels.  A simple term for PVD is poor circulation. Without treatment, PVD tends to get worse.  Treatment may include exercise, low fat and low cholesterol diet, and quitting smoking. This information is not intended to replace advice given to you by your health care provider. Make sure you discuss any questions you have with your health care provider. Document Released: 01/04/2010 Document Revised: 11/17/2016 Document Reviewed: 11/17/2016 Elsevier Interactive Patient Education  2019 [employer].     To decrease swelling in your feet and legs: Elevate feet above slightly bent knees, feet above heart, overnight and 3-4 times per day for 20 minutes.

## 2019-01-07 ENCOUNTER — Other Ambulatory Visit: Payer: Self-pay

## 2019-01-07 ENCOUNTER — Ambulatory Visit: Payer: Medicare Other | Admitting: Podiatry

## 2019-01-07 ENCOUNTER — Encounter: Payer: Self-pay | Admitting: Podiatry

## 2019-01-07 DIAGNOSIS — B351 Tinea unguium: Secondary | ICD-10-CM

## 2019-01-07 DIAGNOSIS — M79674 Pain in right toe(s): Secondary | ICD-10-CM

## 2019-01-07 DIAGNOSIS — M79675 Pain in left toe(s): Secondary | ICD-10-CM

## 2019-01-07 DIAGNOSIS — E1151 Type 2 diabetes mellitus with diabetic peripheral angiopathy without gangrene: Secondary | ICD-10-CM

## 2019-01-07 NOTE — Patient Instructions (Signed)
Diabetes Mellitus and Foot Care Foot care is an important part of your health, especially when you have diabetes. Diabetes may cause you to have problems because of poor blood flow (circulation) to your feet and legs, which can cause your skin to:  Become thinner and drier.  Break more easily.  Heal more slowly.  Peel and crack. You may also have nerve damage (neuropathy) in your legs and feet, causing decreased feeling in them. This means that you may not notice minor injuries to your feet that could lead to more serious problems. Noticing and addressing any potential problems early is the best way to prevent future foot problems. How to care for your feet Foot hygiene  Wash your feet daily with warm water and mild soap. Do not use hot water. Then, pat your feet and the areas between your toes until they are completely dry. Do not soak your feet as this can dry your skin.  Trim your toenails straight across. Do not dig under them or around the cuticle. File the edges of your nails with an emery board or nail file.  Apply a moisturizing lotion or petroleum jelly to the skin on your feet and to dry, brittle toenails. Use lotion that does not contain alcohol and is unscented. Do not apply lotion between your toes. Shoes and socks  Wear clean socks or stockings every day. Make sure they are not too tight. Do not wear knee-high stockings since they may decrease blood flow to your legs.  Wear shoes that fit properly and have enough cushioning. Always look in your shoes before you put them on to be sure there are no objects inside.  To break in new shoes, wear them for just a few hours a day. This prevents injuries on your feet. Wounds, scrapes, corns, and calluses  Check your feet daily for blisters, cuts, bruises, sores, and redness. If you cannot see the bottom of your feet, use a mirror or ask someone for help.  Do not cut corns or calluses or try to remove them with medicine.  If you  find a minor scrape, cut, or break in the skin on your feet, keep it and the skin around it clean and dry. You may clean these areas with mild soap and water. Do not clean the area with peroxide, alcohol, or iodine.  If you have a wound, scrape, corn, or callus on your foot, look at it several times a day to make sure it is healing and not infected. Check for: ? Redness, swelling, or pain. ? Fluid or blood. ? Warmth. ? Pus or a bad smell. General instructions  Do not cross your legs. This may decrease blood flow to your feet.  Do not use heating pads or hot water bottles on your feet. They may burn your skin. If you have lost feeling in your feet or legs, you may not know this is happening until it is too late.  Protect your feet from hot and cold by wearing shoes, such as at the beach or on hot pavement.  Schedule a complete foot exam at least once a year (annually) or more often if you have foot problems. If you have foot problems, report any cuts, sores, or bruises to your health care provider immediately. Contact a health care provider if:  You have a medical condition that increases your risk of infection and you have any cuts, sores, or bruises on your feet.  You have an injury that is not   healing.  You have redness on your legs or feet.  You feel burning or tingling in your legs or feet.  You have pain or cramps in your legs and feet.  Your legs or feet are numb.  Your feet always feel cold.  You have pain around a toenail. Get help right away if:  You have a wound, scrape, corn, or callus on your foot and: ? You have pain, swelling, or redness that gets worse. ? You have fluid or blood coming from the wound, scrape, corn, or callus. ? Your wound, scrape, corn, or callus feels warm to the touch. ? You have pus or a bad smell coming from the wound, scrape, corn, or callus. ? You have a fever. ? You have a red line going up your leg. Summary  Check your feet every day  for cuts, sores, red spots, swelling, and blisters.  Moisturize feet and legs daily.  Wear shoes that fit properly and have enough cushioning.  If you have foot problems, report any cuts, sores, or bruises to your health care provider immediately.  Schedule a complete foot exam at least once a year (annually) or more often if you have foot problems. This information is not intended to replace advice given to you by your health care provider. Make sure you discuss any questions you have with your health care provider. Document Released: 10/07/2000 Document Revised: 11/22/2017 Document Reviewed: 11/11/2016 [employer]vier Interactive Patient Education  2019 [employer]  Onychomycosis/Fungal Toenails  WHAT IS IT? An infection that lies within the keratin of your nail plate that is caused by a fungus.  WHY ME? Fungal infections affect all ages, sexes, races, and creeds.  There may be many factors that predispose you to a fungal infection such as age, coexisting medical conditions such as diabetes, or an autoimmune disease; stress, medications, fatigue, genetics, etc.  Bottom line: fungus thrives in a warm, moist environment and your shoes offer such a location.  IS IT CONTAGIOUS? Theoretically, yes.  You do not want to share shoes, nail clippers or files with someone who has fungal toenails.  Walking around barefoot in the same room or sleeping in the same bed is unlikely to transfer the organism.  It is important to realize, however, that fungus can spread easily from one nail to the next on the same foot.  HOW DO WE TREAT THIS?  There are several ways to treat this condition.  Treatment may depend on many factors such as age, medications, pregnancy, liver and kidney conditions, etc.  It is best to ask your doctor which options are available to you.  1. No treatment.   Unlike many other medical concerns, you can live with this condition.  However for many people this can be a painful condition and  may lead to ingrown toenails or a bacterial infection.  It is recommended that you keep the nails cut short to help reduce the amount of fungal nail. 2. Topical treatment.  These range from herbal remedies to prescription strength nail lacquers.  About 40-50% effective, topicals require twice daily application for approximately 9 to 12 months or until an entirely new nail has grown out.  The most effective topicals are medical grade medications available through physicians offices. 3. Oral antifungal medications.  With an 80-90% cure rate, the most common oral medication requires 3 to 4 months of therapy and stays in your system for a year as the new nail grows out.  Oral antifungal medications do require   blood work to make sure it is a safe drug for you.  A liver function panel will be performed prior to starting the medication and after the first month of treatment.  It is important to have the blood work performed to avoid any harmful side effects.  In general, this medication safe but blood work is required. 4. Laser Therapy.  This treatment is performed by applying a specialized laser to the affected nail plate.  This therapy is noninvasive, fast, and non-painful.  It is not covered by insurance and is therefore, out of pocket.  The results have been very good with a 80-95% cure rate.  [facility] is the only practice in the area to offer this therapy. 5. Permanent Nail Avulsion.  Removing the entire nail so that a new nail will not grow back. 

## 2019-01-14 ENCOUNTER — Other Ambulatory Visit: Payer: Self-pay | Admitting: Internal Medicine

## 2019-01-16 DIAGNOSIS — M5432 Sciatica, left side: Secondary | ICD-10-CM | POA: Diagnosis not present

## 2019-01-16 DIAGNOSIS — M25561 Pain in right knee: Secondary | ICD-10-CM | POA: Diagnosis not present

## 2019-01-16 DIAGNOSIS — N186 End stage renal disease: Secondary | ICD-10-CM | POA: Diagnosis not present

## 2019-01-16 DIAGNOSIS — G8929 Other chronic pain: Secondary | ICD-10-CM | POA: Diagnosis not present

## 2019-01-16 NOTE — Progress Notes (Signed)
Subjective: [name] is a 73 y.o. y.o. female who presents for preventative foot care today with  NIDDM and PAD for painful, mycotic toenails which interfere with daily activities. Pain is aggravated when wearing enclosed shoe gear. Pain is relieved with periodic professional debridement.  PCP is Cathlean Cower. Last visit 12/20/2018.  She has h/o stent placement RLE by Dr. Donzetta Matters in October 2019.  She voices no new problems on today's visit.  She remains on Plavix.  No Known Allergies  Objective: Vascular Examination: Capillary refill time immediate x 10 digits  Dorsalis pedis pulses diminished left; faintly palpable right  Posterior tibial pulses diminished left; faintly palpable right  No digital hair x 10 digits  Skin temperature WNL b/l.  Dermatological Examination: Skin thin, shiny and atrophic b/l.  Toenails 1-5 b/l discolored, thick, dystrophic with subungual debris and pain with palpation to nailbeds due to thickness of nails.  Musculoskeletal: Muscle strength 5/5 to all LE muscle groups  Neurological: Sensation intact with 10 gram monofilament.  Vibratory sensation intact b/l.  Assessment: 1. Painful onychomycosis toenails 1-5 b/l 2. NIDDM with Peripheral arterial disease  Plan: 1. Continue diabetic foot care principles. Literature dispensed. 2. Toenails 1-5 b/l were debrided in length and girth without iatrogenic bleeding. 3. Patient to continue soft, supportive shoe gear daily. 4. Patient to report any pedal injuries to medical professional immediately. 5. Follow up 3 months.  6. Patient/POA to call should there be a concern in the interim.

## 2019-01-20 ENCOUNTER — Other Ambulatory Visit: Payer: Self-pay | Admitting: Internal Medicine

## 2019-01-21 ENCOUNTER — Other Ambulatory Visit: Payer: Self-pay | Admitting: Internal Medicine

## 2019-01-24 ENCOUNTER — Other Ambulatory Visit: Payer: Self-pay

## 2019-01-25 ENCOUNTER — Ambulatory Visit (HOSPITAL_COMMUNITY)
Admission: RE | Admit: 2019-01-25 | Discharge: 2019-01-25 | Disposition: A | Discharge status: Home / Self Care | Payer: Medicare Other | Source: Ambulatory Visit | Attending: Nephrology | Admitting: Nephrology

## 2019-01-25 ENCOUNTER — Other Ambulatory Visit: Payer: Self-pay | Admitting: Internal Medicine

## 2019-01-25 VITALS — BP 144/84 | HR 82 | Temp 97.3°F | Resp 18

## 2019-01-25 DIAGNOSIS — D539 Nutritional anemia, unspecified: Secondary | ICD-10-CM | POA: Diagnosis not present

## 2019-01-25 LAB — RENAL FUNCTION PANEL
Albumin: 3.5 g/dL (ref 3.5–5.0)
Anion gap: 14 (ref 5–15)
BUN: 71 mg/dL — ABNORMAL HIGH (ref 8–23)
CO2: 24 mmol/L (ref 22–32)
Calcium: 10.3 mg/dL (ref 8.9–10.3)
Chloride: 101 mmol/L (ref 98–111)
Creatinine, Ser: 2.84 mg/dL — ABNORMAL HIGH (ref 0.44–1.00)
GFR calc Af Amer: 18 mL/min — ABNORMAL LOW (ref >60)
GFR calc non Af Amer: 16 mL/min — ABNORMAL LOW (ref >60)
Glucose, Bld: 150 mg/dL — ABNORMAL HIGH (ref 70–99)
Phosphorus: 4.5 mg/dL (ref 2.5–4.6)
Potassium: 4 mmol/L (ref 3.5–5.1)
Sodium: 139 mmol/L (ref 135–145)

## 2019-01-25 LAB — IRON AND TIBC
Iron: 65 ug/dL (ref 28–170)
Saturation Ratios: 22 % (ref 10.4–31.8)
TIBC: 301 ug/dL (ref 250–450)
UIBC: 236 ug/dL

## 2019-01-25 LAB — POCT HEMOGLOBIN-HEMACUE: Hemoglobin: 11.3 g/dL — ABNORMAL LOW (ref 12.0–15.0)

## 2019-01-25 LAB — FERRITIN: Ferritin: 839 ng/mL — ABNORMAL HIGH (ref 11–307)

## 2019-01-25 MED ORDER — EPOETIN ALFA 20000 UNIT/ML IJ SOLN
INTRAMUSCULAR | Status: AC
  Administered 2019-01-25: 20000 [IU] via SUBCUTANEOUS
  Filled 2019-01-25: qty 1

## 2019-01-25 MED ORDER — EPOETIN ALFA 10000 UNIT/ML IJ SOLN: 20000.0000 [IU] | INTRAMUSCULAR | Status: DC

## 2019-01-25 NOTE — Telephone Encounter (Signed)
Done erx 

## 2019-01-26 LAB — PTH, INTACT AND CALCIUM
Calcium, Total (PTH): 10.4 mg/dL — ABNORMAL HIGH (ref 8.7–10.3)
PTH: 26 pg/mL (ref 15–65)

## 2019-01-28 ENCOUNTER — Other Ambulatory Visit: Payer: Self-pay | Admitting: Internal Medicine

## 2019-03-01 ENCOUNTER — Inpatient Hospital Stay (HOSPITAL_COMMUNITY)
Admission: RE | Admit: 2019-03-01 | Discharge: 2019-03-01 | Disposition: A | Discharge status: Home / Self Care | Payer: Medicare Other | Source: Ambulatory Visit | Attending: Nephrology | Admitting: Nephrology

## 2019-03-11 ENCOUNTER — Other Ambulatory Visit: Payer: Self-pay

## 2019-03-11 ENCOUNTER — Ambulatory Visit (HOSPITAL_COMMUNITY)
Admission: RE | Admit: 2019-03-11 | Discharge: 2019-03-11 | Disposition: A | Discharge status: Home / Self Care | Payer: Medicare Other | Source: Ambulatory Visit | Attending: Nephrology | Admitting: Nephrology

## 2019-03-11 VITALS — BP 128/84 | HR 89 | Temp 97.3°F | Resp 20

## 2019-03-11 DIAGNOSIS — D539 Nutritional anemia, unspecified: Secondary | ICD-10-CM | POA: Diagnosis not present

## 2019-03-11 LAB — POCT HEMOGLOBIN-HEMACUE: Hemoglobin: 11.6 g/dL — ABNORMAL LOW (ref 12.0–15.0)

## 2019-03-11 MED ORDER — EPOETIN ALFA 20000 UNIT/ML IJ SOLN
INTRAMUSCULAR | Status: AC
  Administered 2019-03-11: 20000 [IU] via SUBCUTANEOUS
  Filled 2019-03-11: qty 1

## 2019-03-11 MED ORDER — EPOETIN ALFA 10000 UNIT/ML IJ SOLN: 20000.0000 [IU] | INTRAMUSCULAR | Status: DC

## 2019-03-13 DIAGNOSIS — H401134 Primary open-angle glaucoma, bilateral, indeterminate stage: Secondary | ICD-10-CM | POA: Diagnosis not present

## 2019-03-13 DIAGNOSIS — H27132 Posterior dislocation of lens, left eye: Secondary | ICD-10-CM | POA: Diagnosis not present

## 2019-03-28 ENCOUNTER — Other Ambulatory Visit: Payer: Self-pay | Admitting: Internal Medicine

## 2019-04-04 ENCOUNTER — Other Ambulatory Visit: Payer: Self-pay

## 2019-04-04 DIAGNOSIS — I779 Disorder of arteries and arterioles, unspecified: Secondary | ICD-10-CM

## 2019-04-04 DIAGNOSIS — I872 Venous insufficiency (chronic) (peripheral): Secondary | ICD-10-CM

## 2019-04-04 DIAGNOSIS — I6523 Occlusion and stenosis of bilateral carotid arteries: Secondary | ICD-10-CM

## 2019-04-05 ENCOUNTER — Ambulatory Visit (HOSPITAL_COMMUNITY)
Admission: RE | Admit: 2019-04-05 | Discharge: 2019-04-05 | Disposition: A | Discharge status: Home / Self Care | Payer: Medicare Other | Source: Ambulatory Visit | Attending: Nephrology | Admitting: Nephrology

## 2019-04-05 ENCOUNTER — Telehealth (HOSPITAL_COMMUNITY): Payer: Self-pay | Admitting: Rehabilitation

## 2019-04-05 ENCOUNTER — Other Ambulatory Visit: Payer: Self-pay

## 2019-04-05 VITALS — BP 130/65 | HR 72 | Temp 97.2°F | Resp 20

## 2019-04-05 DIAGNOSIS — D539 Nutritional anemia, unspecified: Secondary | ICD-10-CM | POA: Insufficient documentation

## 2019-04-05 LAB — IRON AND TIBC
Iron: 57 ug/dL (ref 28–170)
Saturation Ratios: 21 % (ref 10.4–31.8)
TIBC: 276 ug/dL (ref 250–450)
UIBC: 219 ug/dL

## 2019-04-05 LAB — FERRITIN: Ferritin: 837 ng/mL — ABNORMAL HIGH (ref 11–307)

## 2019-04-05 LAB — POCT HEMOGLOBIN-HEMACUE: Hemoglobin: 12.5 g/dL (ref 12.0–15.0)

## 2019-04-05 MED ORDER — EPOETIN ALFA 20000 UNIT/ML IJ SOLN
INTRAMUSCULAR | Status: AC
  Filled 2019-04-05: qty 1

## 2019-04-05 MED ORDER — EPOETIN ALFA 10000 UNIT/ML IJ SOLN: 20000.0000 [IU] | INTRAMUSCULAR | Status: DC

## 2019-04-05 NOTE — Telephone Encounter (Signed)
The above patient or their representative was contacted and gave the following answers to these questions:         Do you have any of the following symptoms? No Fever                    Cough                   Shortness of breath  Do  you have any of the following other symptoms? No  muscle pain         vomiting,        diarrhea        rash         weakness        red eye        abdominal pain         bruising         bleeding              joint pain           severe headache  Have you been in contact with someone who was or has been sick in the past 2 weeks? No Yes                 Unsure                         Unable to assess   Does the person that you were in contact with have any of the following symptoms?  Cough         shortness of breath           muscle pain         vomiting,            diarrhea            rash            weakness           fever            red eye           abdominal pain          bruising  or  bleeding                joint pain                severe headache             Have you  or someone you have been in contact with traveled internationally in the last month?  No      If yes, which countries?  Have you  or someone you have been in contact with traveled outside  in the last month?  No      If yes, which state and city?  COMMENTS OR ACTION PLAN FOR THIS PATIENT:    

## 2019-04-07 ENCOUNTER — Other Ambulatory Visit: Payer: Self-pay | Admitting: Internal Medicine

## 2019-04-08 ENCOUNTER — Encounter: Payer: Self-pay | Admitting: Family

## 2019-04-08 ENCOUNTER — Ambulatory Visit: Payer: Medicare Other | Admitting: Family

## 2019-04-08 ENCOUNTER — Ambulatory Visit (INDEPENDENT_AMBULATORY_CARE_PROVIDER_SITE_OTHER)
Admission: RE | Admit: 2019-04-08 | Discharge: 2019-04-08 | Disposition: A | Discharge status: Home / Self Care | Payer: Medicare Other | Source: Ambulatory Visit | Attending: Family | Admitting: Family

## 2019-04-08 ENCOUNTER — Other Ambulatory Visit: Payer: Self-pay

## 2019-04-08 ENCOUNTER — Ambulatory Visit (HOSPITAL_COMMUNITY)
Admission: RE | Admit: 2019-04-08 | Discharge: 2019-04-08 | Disposition: A | Discharge status: Home / Self Care | Payer: Medicare Other | Source: Ambulatory Visit | Attending: Family | Admitting: Family

## 2019-04-08 VITALS — BP 180/92 | HR 83 | Temp 97.1°F | Resp 18 | Ht 61.0 in | Wt 225.0 lb

## 2019-04-08 DIAGNOSIS — I6523 Occlusion and stenosis of bilateral carotid arteries: Secondary | ICD-10-CM | POA: Insufficient documentation

## 2019-04-08 DIAGNOSIS — I779 Disorder of arteries and arterioles, unspecified: Secondary | ICD-10-CM | POA: Insufficient documentation

## 2019-04-08 DIAGNOSIS — I872 Venous insufficiency (chronic) (peripheral): Secondary | ICD-10-CM | POA: Insufficient documentation

## 2019-04-08 DIAGNOSIS — N184 Chronic kidney disease, stage 4 (severe): Secondary | ICD-10-CM

## 2019-04-08 NOTE — Progress Notes (Signed)
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 female who is status post bilateral common iliac artery stenting on 08-13-18 by Dr. 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  At her visit in 10-05-18, her stents were patent although there appeared to be stenosis of the right common iliac artery which Dr. Donzetta Matters thought was residual from previous as the angiogram did not demonstrate renal stenosis but pressure gradient was 50 mmHg down to 0 after stenting.  She has chronic kidney disease which is somewhat prohibitive to have further angiography. Last serum creatinine and GFR result on file was 2.84 and <16 on 01-25-19, she sees [facility].  She states her blood pressure at home yesterday was 140/80; she denies chest pain, denies feeling light headed, denies headache. She is somewhat dyspneic, but states not more short of breath than usual.  Dr. Donzetta Matters last evaluated [name] on 10-05-18. At that time Dr. Donzetta Matters advised [name] to continue on aspirin and Plavix, follow-up in 3 to 4 months with repeat studies. Dr. Donzetta Matters recommended compression stockings bilaterally.  [name] denies any known hx of stroke or TIA. She states that the swelling in her lower legs resolves by morning with overnight elevation.    Diabetic: Yes, 10.7 A1C on 12-20-18, uncontrolled Tobacco use: former smoker, quit in 2009, started at age 53 years   [name] meds include: Statin :Yes Betablocker: Yes ASA: Yes Other anticoagulants/antiplatelets: Plavix   Past Medical History:  Diagnosis Date  . Anemia, iron deficiency   . Aortic atherosclerosis ([place]) 11/17/2017  . Arthritis   . CAD (coronary artery disease)   . Carotid stenosis    Carotid US (02/2014):  Bilateral ICA 40-59%; > 50% L ECA; F/u 1 year  . Cataract     removed both eyes  . Cholelithiasis 11/17/2017  . Chronic kidney disease    chronic renal failure  . DM2 (diabetes mellitus, type 2) ([place])   . GERD (gastroesophageal reflux disease)   . Glaucoma   . HLD (hyperlipidemia)   . HTN (hypertension)   . Hx of cardiovascular stress test    Lexiscan Myoview (03/24/14):  No ischemia, EF 73%, Normal  . Hypoglycemia 06/27/2017  . Mitral regurgitation   . Osteopenia 07/21/2017  . Osteoporosis   . PUD (peptic ulcer disease)   . PVD (peripheral vascular disease) ([place])   . Shortness of breath    with exertion    Social History Social History   Tobacco Use  . Smoking status: Former Smoker    Quit date: 10/25/2007    Years since quitting: 11.4  . Smokeless tobacco: Never Used  . Tobacco comment: Stopped 1.5 yrs ago January 2009  Substance Use Topics  . Alcohol use: No  . Drug use: No    Family History Family History  Problem Relation Age of Onset  . Heart disease Father   . Glaucoma Father   . Diabetes Sister   . Diabetes Son   . Colon cancer Neg Hx   . Colon polyps Neg Hx   . Esophageal cancer Neg Hx   . Rectal cancer Neg Hx   . Stomach cancer Neg Hx     Past Surgical History:  Procedure Laterality Date  . ABDOMINAL AORTOGRAM W/LOWER EXTREMITY N/A 08/13/2018   Procedure: ABDOMINAL AORTOGRAM W/LOWER EXTREMITY;  Surgeon:  Waynetta Sandy, MD;  Location: [facility];  Service: Cardiovascular;  Laterality: N/A;  . ABDOMINAL HYSTERECTOMY    . APPENDECTOMY    . AV FISTULA PLACEMENT Right 01/18/2013   Procedure: ARTERIOVENOUS (AV) FISTULA CREATION;  Surgeon: Rosetta Posner, MD;  Location: [facility];  Service: Vascular;  Laterality: Right;  Ultrasound guided  . COLONOSCOPY    . CORONARY ANGIOPLASTY WITH STENT PLACEMENT    . PERIPHERAL VASCULAR INTERVENTION Bilateral 08/13/2018   Procedure: PERIPHERAL VASCULAR INTERVENTION;  Surgeon: Waynetta Sandy, MD;  Location: [facility];  Service: Cardiovascular;   Laterality: Bilateral;  . RENAL ANGIOGRAM N/A 09/16/2011   Procedure: RENAL ANGIOGRAM;  Surgeon: Sherren Mocha, MD;  Location: [facility];  Service: Cardiovascular;  Laterality: N/A;    No Known Allergies  Current Outpatient Medications  Medication Sig Dispense Refill  . ACCU-CHEK AVIVA PLUS test strip USE AS INSTRUCTED THREE TIMES DAILY. DX E11.9 100 each 12  . acetaminophen (TYLENOL) 500 MG tablet Take 1-2 tablets (500-1,000 mg total) by mouth every 8 (eight) hours as needed for mild pain.    Marland Kitchen allopurinol (ZYLOPRIM) 100 MG tablet Take 100 mg by mouth daily.    Marland Kitchen amLODipine (NORVASC) 10 MG tablet TAKE ONE (1) TABLET EACH DAY 90 tablet 1  . aspirin 81 MG tablet Take 81 mg by mouth daily.    Marland Kitchen atorvastatin (LIPITOR) 80 MG tablet TAKE 1 TABLET (80 MG TOTAL) BY MOUTH DAILY AT 6 PM. 90 tablet 3  . BD PEN NEEDLE NANO U/F 32G X 4 MM MISC USE DAILY WITH LANTUS SOLASTAR 100 each 11  . blood glucose meter kit and supplies KIT Dispense based on patient and insurance preference. Use up to three times daily as directed. (FOR ICD-9 250.00, 250.01). 1 each 0  . calcitRIOL (ROCALTROL) 0.25 MCG capsule Take 0.25 mcg by mouth daily.    . clindamycin (CLEOCIN) 300 MG capsule Take 1 capsule (300 mg total) by mouth 3 (three) times daily. 30 capsule 0  . clopidogrel (PLAVIX) 75 MG tablet TAKE 1 TABLET BY MOUTH EVERY DAY 90 tablet 1  . Cyanocobalamin (B-12 PO) Take 1 tablet by mouth daily.    . diclofenac sodium (VOLTAREN) 1 % GEL Apply 4 g topically 4 (four) times daily as needed. 400 g 11  . ferrous sulfate 325 (65 FE) MG tablet TAKE 1 TABLET (325 MG TOTAL) BY MOUTH 2 (TWO) TIMES DAILY. 180 tablet 1  . furosemide (LASIX) 80 MG tablet Take 0.5 tablets (40 mg total) by mouth 2 (two) times daily.    . hydrOXYzine (ATARAX/VISTARIL) 10 MG tablet Take 1 tablet (10 mg total) by mouth 3 (three) times daily as needed for itching. 30 tablet 0  . Insulin Glargine (LANTUS SOLOSTAR) 100 UNIT/ML Solostar Pen Inject 10  Units into the skin daily. 5 pen 11  . insulin lispro (HUMALOG KWIKPEN) 100 UNIT/ML KwikPen INEJCT 0-9 UNITS AS DIRECTED INTO SKIN 3 TIMES A DAY 15 mL 1  . Lancets MISC Use as directed three times daily with meals 100 each 3  . metoprolol tartrate (LOPRESSOR) 100 MG tablet TAKE 1 TABLET BY MOUTH TWICE A DAY 180 tablet 2  . nitroGLYCERIN (NITROSTAT) 0.4 MG SL tablet Place 1 tablet (0.4 mg total) under the tongue every 5 (five) minutes as needed for chest pain. Reported on 04/06/2016 25 tablet 5  . omeprazole (PRILOSEC) 20 MG capsule TAKE 1 CAPSULE BY MOUTH EVERY DAY 90 capsule 0  . polyethylene glycol  powder (GLYCOLAX/MIRALAX) powder Take 17 gm by mouth daily 3350 g 11  . Potassium Chloride ER 20 MEQ TBCR Take 20 mEq by mouth daily.   6  . risedronate (ACTONEL) 150 MG tablet Take 1 tablet (150 mg total) by mouth every 30 (thirty) days. First Tuesday 3 tablet 3  . Tetrahydrozoline HCl (VISINE OP) Place 2 drops into both eyes daily as needed (DRY EYE).    Marland Kitchen thiamine (VITAMIN B-1) 50 MG tablet Take 1 tablet (50 mg total) by mouth daily. 90 tablet 1  . timolol (BETIMOL) 0.5 % ophthalmic solution Place 1 drop into both eyes daily.     Marland Kitchen tiZANidine (ZANAFLEX) 4 MG tablet TAKE 1 TABLET BY MOUTH EVERY 6 HOURS AS NEEDED FOR MUSCLE SPASMS 60 tablet 2  . traMADol (ULTRAM) 50 MG tablet Take 1 tablet (50 mg total) by mouth every 6 (six) hours as needed. 120 tablet 2  . zolpidem (AMBIEN) 5 MG tablet TAKE 1 TABLET (5 MG TOTAL) BY MOUTH AT BEDTIME AS NEEDED FOR SLEEP. 30 tablet 5   No current facility-administered medications for this visit.     ROS: See HPI for pertinent positives and negatives.   Physical Examination  Vitals:   04/08/19 1134  BP: (!) 180/92  Pulse: 83  Resp: 18  Temp: (!) 97.1 F (36.2 C)  TempSrc: Oral  SpO2: 97%  Weight: 225 lb (102.1 kg)  Height: _0  (1.549 m)   Body mass index is 42.51 kg/m.  General: A&O x 3, WDWN, morbidly obese female. Gait: slow, steady, using  cane HENT: No gross abnormalities.  Eyes: Pupils are equal Pulmonary: Respirations are moderately labored at rest, CTAB, good air movement in all fields, occasional moist cough. Cardiac: regular rhythm, no detected murmur.         Carotid Bruits Right Left   Negative Negative   Radial pulses are 1+ palpable right, 2+ left. Adominal aortic pulse is not palpable                         VASCULAR EXAM: Extremities without ischemic changes, without Gangrene; without open wounds. 1+ bilateral pretibial edema.                                                                                                           LE Pulses Right Left       FEMORAL  not palpable, morbidly obese  not palpable        POPLITEAL  not palpable   not palpable       POSTERIOR TIBIAL  not palpable   not palpable        DORSALIS PEDIS      ANTERIOR TIBIAL not palpable  not palpable    Abdomen: softly obese, NT, no palpable masses. Skin: no rashes, no cellulitis, no ulcers noted. Tips of ears are somewhat cyanotic.  Musculoskeletal: no muscle wasting or atrophy.  Neurologic: A&O X 3; appropriate affect, Sensation is normal; MOTOR FUNCTION:  moving all extremities equally, motor strength 4/5  throughout. Speech is fluent/normal. CN 2-12 intact. Psychiatric: Thought content is normal, mood appropriate for clinical situation.    ASSESSMENT: KATELYN BROADNAX is a 73 y.o. female who is status post bilateral common iliac artery stenting on 08-13-18 by Dr. 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   There are no signs of ischemia in her feet or legs. She walks with a cane, her walking seems limited by her multiple medical problems, dyspnea, morbid obesity, and no claudication.  She does not seem to walk enough to elicit claudication.  Bilateral aortoiliac duplex today was a suboptimal exam, further testing may be warranted. Probable > 50%  common iliac artery stenosis. Stent walls not visualized. Brisk monophasic signals bilaterally.  ABI's today unchanged from 01-04-19: biphasic waveforms bilaterally, non compressible vessels on the right, moderate disease in the left.   She has no hx of stroke or ZYS:AYTKZ'S carotid duplex shows 1-39% ICA stenosis; less bilateral ICA stenosis than noted on the carotid duplex on 04-05-16.   Her atherosclerotic risk factors include uncontrolled DM, CKD, morbid obesity, former smoker, and CAD.  I advised [name] to work closely with her PCP to get her blood sugar under as good control as possible.  She is under the care of a nephrologist.    DATA  Carotid Duplex (04-08-19): Right Carotid Findings: +----------+--------+--------+--------+------------+--------+           PSV cm/sEDV cm/sStenosisDescribe    Comments +----------+--------+--------+--------+------------+--------+ CCA Prox  80      9                                    +----------+--------+--------+--------+------------+--------+ CCA Mid   63      12                                   +----------+--------+--------+--------+------------+--------+ CCA Distal46      9                                    +----------+--------+--------+--------+------------+--------+ ICA Prox  115     21      1-39%   heterogenous         +----------+--------+--------+--------+------------+--------+ ICA Mid   97      25                                   +----------+--------+--------+--------+------------+--------+ ICA Distal55      19                                   +----------+--------+--------+--------+------------+--------+ ECA       42      6                                    +----------+--------+--------+--------+------------+--------+  +----------+--------+-------+----------------+-------------------+           PSV cm/sEDV cmsDescribe        Arm Pressure  (mmHG) +----------+--------+-------+----------------+-------------------+ [id number]     1      Multiphasic, WNL                    +----------+--------+-------+----------------+-------------------+  +---------+--------+--+--------+-+----------------------------+  VertebralPSV cm/s28EDV cm/s7Atypical and Bi- directional +---------+--------+--+--------+-+----------------------------+ Right Brachial 81 cm/s triphasic waveform. Left Brachial waveform 77 cm/s triphasic waveforms.   Left Carotid Findings: +----------+--------+--------+--------+-------------------------+--------+           PSV cm/sEDV cm/sStenosisDescribe                 Comments +----------+--------+--------+--------+-------------------------+--------+ CCA Prox  67      10                                                +----------+--------+--------+--------+-------------------------+--------+ CCA Mid   72      12                                                +----------+--------+--------+--------+-------------------------+--------+ CCA Distal86      16              heterogenous                      +----------+--------+--------+--------+-------------------------+--------+ ICA Prox  128     30      1-39%   heterogenous and calcific         +----------+--------+--------+--------+-------------------------+--------+ ICA Mid   97      23                                                +----------+--------+--------+--------+-------------------------+--------+ ICA Distal67      17                                                +----------+--------+--------+--------+-------------------------+--------+ ECA       183     2               heterogenous                      +----------+--------+--------+--------+-------------------------+--------+  +----------+--------+--------+----------------+-------------------+ SubclavianPSV cm/sEDV cm/sDescribe        Arm  Pressure (mmHG) +----------+--------+--------+----------------+-------------------+           138     4       Multiphasic, WNL                    +----------+--------+--------+----------------+-------------------+  +---------+--------+--+--------+--+---------+ VertebralPSV cm/s48EDV cm/s11Antegrade +---------+--------+--+--------+--+---------+  Summary: Right Carotid: Velocities in the right ICA are consistent with a 1-39% stenosis,                upper end of range. Atypical vertebral artery waveform suggesting                more proximal disease. Left Carotid: Velocities in the left ICA are consistent with a 1-39% stenosis,               probable upper end of range. Calcific plaque may obscure higher               velocity.    Aortoiliac Duplex (04-08-19): Abdominal Aorta Findings: +-------------+-------+----------+----------+----------+--------+--------+ Location  AP (cm)Trans (cm)PSV (cm/s)Waveform  ThrombusComments +-------------+-------+----------+----------+----------+--------+--------+ RT EIA Distal212.0                      monophasic        brisk    +-------------+-------+----------+----------+----------+--------+--------+ LT EIA Prox  166.0                      monophasic                 +-------------+-------+----------+----------+----------+--------+--------+ LT EIA Mid   199.0                      monophasic        brisk    +-------------+-------+----------+----------+----------+--------+--------+ LT EIA Distal215.0                      monophasic        brisk    +-------------+-------+----------+----------+----------+--------+--------+    Right Stent(s): +--------------+---+++--------+ Prox to Stent 222stenotic +--------------+---+++--------+ Proximal Stent227stenotic +--------------+---+++--------+ Right CFA 114 cm/s broad biphasic waveform    Left  Stent(s): +--------------+---++----------+--------+ Prox to Stent 231                   +--------------+---++----------+--------+ Proximal Stent41moophasicstenotic +--------------+---++----------+--------+ Distal Stent  273mophasicbrisk    +--------------+---++----------+--------+ Left CFA 85 cm/s Brisk monophasic waveform   Summary: IVC/Iliac: Suboptimal exam, further testing may be warranted. Probable > 50% common iliac artery stenosis. Stent walls not visualized.    ABI (Date: 04/08/2019): ABI Findings: +---------+------------------+-----+--------+--------+ Right    Rt Pressure (mmHg)IndexWaveformComment  +---------+------------------+-----+--------+--------+ Brachial 216                                     +---------+------------------+-----+--------+--------+ PTA      254               1.18 biphasic         +---------+------------------+-----+--------+--------+ DP       139               0.64 biphasic         +---------+------------------+-----+--------+--------+ Great Toe86                0.40 Abnormal         +---------+------------------+-----+--------+--------+  +---------+------------------+-----+--------+-------+ Left     Lt Pressure (mmHg)IndexWaveformComment +---------+------------------+-----+--------+-------+ Brachial 203                                    +---------+------------------+-----+--------+-------+ PTA      119               0.55 biphasic        +---------+------------------+-----+--------+-------+ DP       131               0.61 biphasic        +---------+------------------+-----+--------+-------+ Great Toe77                0.36 Abnormal        +---------+------------------+-----+--------+-------+  +-------+-----------+-----------+------------+------------+ ABI/TBIToday's ABIToday's TBIPrevious ABIPrevious  TBI +-------+-----------+-----------+------------+------------+ Right  [place]         0.40       [place]          0.33         +-------+-----------+-----------+------------+------------+ Left   0.61  0.36       0.58        0.40         +-------+-----------+-----------+------------+------------  Bilateral ABIs and TBIs appear essentially unchanged.   Summary: Right: Resting right ankle-brachial index indicates noncompressible right lower extremity arteries.The right toe-brachial index is abnormal. Left: Resting left ankle-brachial index indicates moderate left lower extremity arterial disease. The left toe-brachial index is abnormal.     PLAN:  Based on the patient's vascular studies and examination, [name] will return to clinic in 3 months with bilateral aortoiliac artery duplex and ABI's   We discussed walking more as long as she is steady on her feet. Daily seated leg exercises discussed and demonstrated.   I discussed in depth with the patient the nature of atherosclerosis, and emphasized the importance of maximal medical management including strict control of blood pressure, blood glucose, and lipid levels, obtaining regular exercise, and continued cessation of smoking.  The patient is aware that without maximal medical management the underlying atherosclerotic disease process will progress, limiting the benefit of any interventions.  The patient was given information about PAD including signs, symptoms, treatment, what symptoms should prompt the patient to seek immediate medical care, and risk reduction measures to take.  Clemon Chambers, RN, MSN, FNP-C [facility] Phone: [phone]  Clinic MD: Trula Slade  04/08/19 12:10 PM

## 2019-04-08 NOTE — Patient Instructions (Signed)
Before your next abdominal ultrasound:  Avoid gas forming foods and beverages the day before the test.   Take two Extra-Strength Gas-X capsules at bedtime the night before the test. Take another two Extra-Strength Gas-X capsules in the middle of the night if you get up to the restroom, if not, first thing in the morning with water.  Do not chew gum.    Stroke Prevention Some medical conditions and lifestyle choices can lead to a higher risk for a stroke. You can help to prevent a stroke by making nutrition, lifestyle, and other changes. What nutrition changes can be made?   Eat healthy foods. ? Choose foods that are high in fiber. These include:  Fresh fruits.  Fresh vegetables.  Whole grains. ? Eat at least 5 or more servings of fruits and vegetables each day. Try to fill half of your plate at each meal with fruits and vegetables. ? Choose lean protein foods. These include:  Lowfat (lean) cuts of meat.  Chicken without skin.  Fish.  Tofu.  Beans.  Nuts. ? Eat low-fat dairy products. ? Avoid foods that:  Are high in salt (sodium).  Have saturated fat.  Have trans fat.  Have cholesterol.  Are processed.  Are premade.  Follow eating guidelines as told by your doctor. These may include: ? Reducing how many calories you eat and drink each day. ? Limiting how much salt you eat or drink each day to 1,500 milligrams (mg). ? Using only healthy fats for cooking. These include:  Olive oil.  Canola oil.  Sunflower oil. ? Counting how many carbohydrates you eat and drink each day. What lifestyle changes can be made?  Try to stay at a healthy weight. Talk to your doctor about what a good weight is for you.  Get at least 30 minutes of moderate physical activity at least 5 days a week. This can include: ? Fast walking. ? Biking. ? Swimming.  Do not use any products that have nicotine or tobacco. This includes cigarettes and e-cigarettes. If you need help  quitting, ask your doctor. Avoid being around tobacco smoke in general.  Limit how much alcohol you drink to no more than 1 drink a day for nonpregnant women and 2 drinks a day for men. One drink equals 12 oz of beer, 5 oz of wine, or 1 oz of hard liquor.  Do not use drugs.  Avoid taking birth control pills. Talk to your doctor about the risks of taking birth control pills if: ? You are over 23 years old. ? You smoke. ? You get migraines. ? You have had a blood clot. What other changes can be made?  Manage your cholesterol. ? It is important to eat a healthy diet. ? If your cholesterol cannot be managed through your diet, you may also need to take medicines. Take medicines as told by your doctor.  Manage your diabetes. ? It is important to eat a healthy diet and to exercise regularly. ? If your blood sugar cannot be managed through diet and exercise, you may need to take medicines. Take medicines as told by your doctor.  Control your high blood pressure (hypertension). ? Try to keep your blood pressure below 130/80. This can help lower your risk of stroke. ? It is important to eat a healthy diet and to exercise regularly. ? If your blood pressure cannot be managed through diet and exercise, you may need to take medicines. Take medicines as told by your doctor. ? Ask  your doctor if you should check your blood pressure at home. ? Have your blood pressure checked every year. Do this even if your blood pressure is normal.  Talk to your doctor about getting checked for a sleep disorder. Signs of this can include: ? Snoring a lot. ? Feeling very tired.  Take over-the-counter and prescription medicines only as told by your doctor. These may include aspirin or blood thinners (antiplatelets or anticoagulants).  Make sure that any other medical conditions you have are managed. Where to find more information  American Stroke Association: www.strokeassociation.org  National Stroke  Association: www.stroke.org Get help right away if:  You have any symptoms of stroke. "BE FAST" is an easy way to remember the main warning signs: ? B - Balance. Signs are dizziness, sudden trouble walking, or loss of balance. ? E - Eyes. Signs are trouble seeing or a sudden change in how you see. ? F - Face. Signs are sudden weakness or loss of feeling of the face, or the face or eyelid drooping on one side. ? A - Arms. Signs are weakness or loss of feeling in an arm. This happens suddenly and usually on one side of the body. ? S - Speech. Signs are sudden trouble speaking, slurred speech, or trouble understanding what people say. ? T - Time. Time to call emergency services. Write down what time symptoms started.  You have other signs of stroke, such as: ? A sudden, very bad headache with no known cause. ? Feeling sick to your stomach (nausea). ? Throwing up (vomiting). ? Jerky movements you cannot control (seizure). These symptoms may represent a serious problem that is an emergency. Do not wait to see if the symptoms will go away. Get medical help right away. Call your local emergency services (911 in the [place] Do not drive yourself to the hospital. Summary  You can prevent a stroke by eating healthy, exercising, not smoking, drinking less alcohol, and treating other health problems, such as diabetes, high blood pressure, or high cholesterol.  Do not use any products that contain nicotine or tobacco, such as cigarettes and e-cigarettes.  Get help right away if you have any signs or symptoms of a stroke. This information is not intended to replace advice given to you by your health care provider. Make sure you discuss any questions you have with your health care provider. Document Released: 04/10/2012 Document Revised: 01/11/2017 Document Reviewed: 01/11/2017 Elsevier Interactive Patient Education  2019 [place].     Peripheral Vascular Disease  Peripheral vascular disease  (PVD) is a disease of the blood vessels that are not part of your heart and brain. A simple term for PVD is poor circulation. In most cases, PVD narrows the blood vessels that carry blood from your heart to the rest of your body. This can reduce the supply of blood to your arms, legs, and internal organs, like your stomach or kidneys. However, PVD most often affects a person's lower legs and feet. Without treatment, PVD tends to get worse. PVD can also lead to acute ischemic limb. This is when an arm or leg suddenly cannot get enough blood. This is a medical emergency. Follow these instructions at home: Lifestyle  Do not use any products that contain nicotine or tobacco, such as cigarettes and e-cigarettes. If you need help quitting, ask your doctor.  Lose weight if you are overweight. Or, stay at a healthy weight as told by your doctor.  Eat a diet that is low in  fat and cholesterol. If you need help, ask your doctor.  Exercise regularly. Ask your doctor for activities that are right for you. General instructions  Take over-the-counter and prescription medicines only as told by your doctor.  Take good care of your feet: ? Wear comfortable shoes that fit well. ? Check your feet often for any cuts or sores.  Keep all follow-up visits as told by your doctor This is important. Contact a doctor if:  You have cramps in your legs when you walk.  You have leg pain when you are at rest.  You have coldness in a leg or foot.  Your skin changes.  You are unable to get or have an erection (erectile dysfunction).  You have cuts or sores on your feet that do not heal. Get help right away if:  Your arm or leg turns cold, numb, and blue.  Your arms or legs become red, warm, swollen, painful, or numb.  You have chest pain.  You have trouble breathing.  You suddenly have weakness in your face, arm, or leg.  You become very confused or you cannot speak.  You suddenly have a very bad  headache.  You suddenly cannot see. Summary  Peripheral vascular disease (PVD) is a disease of the blood vessels.  A simple term for PVD is poor circulation. Without treatment, PVD tends to get worse.  Treatment may include exercise, low fat and low cholesterol diet, and quitting smoking. This information is not intended to replace advice given to you by your health care provider. Make sure you discuss any questions you have with your health care provider. Document Released: 01/04/2010 Document Revised: 11/17/2016 Document Reviewed: 11/17/2016 Elsevier Interactive Patient Education  2019 [employer].

## 2019-04-10 ENCOUNTER — Other Ambulatory Visit: Payer: Self-pay

## 2019-04-10 ENCOUNTER — Ambulatory Visit: Payer: Medicare Other | Admitting: Podiatry

## 2019-04-10 ENCOUNTER — Encounter: Payer: Self-pay | Admitting: Podiatry

## 2019-04-10 VITALS — Temp 97.9°F

## 2019-04-10 DIAGNOSIS — M79674 Pain in right toe(s): Secondary | ICD-10-CM

## 2019-04-10 DIAGNOSIS — M79675 Pain in left toe(s): Secondary | ICD-10-CM | POA: Diagnosis not present

## 2019-04-10 DIAGNOSIS — E1151 Type 2 diabetes mellitus with diabetic peripheral angiopathy without gangrene: Secondary | ICD-10-CM

## 2019-04-10 DIAGNOSIS — B351 Tinea unguium: Secondary | ICD-10-CM | POA: Diagnosis not present

## 2019-04-10 NOTE — Patient Instructions (Signed)

## 2019-04-15 ENCOUNTER — Encounter (HOSPITAL_COMMUNITY): Payer: Medicare Other

## 2019-04-17 ENCOUNTER — Other Ambulatory Visit: Payer: Self-pay | Admitting: Internal Medicine

## 2019-04-19 ENCOUNTER — Ambulatory Visit (HOSPITAL_COMMUNITY)
Admission: RE | Admit: 2019-04-19 | Discharge: 2019-04-19 | Disposition: A | Discharge status: Home / Self Care | Payer: Medicare Other | Source: Ambulatory Visit | Attending: Nephrology | Admitting: Nephrology

## 2019-04-19 ENCOUNTER — Other Ambulatory Visit: Payer: Self-pay

## 2019-04-19 VITALS — BP 150/67 | HR 69 | Temp 96.4°F | Resp 20

## 2019-04-19 DIAGNOSIS — D539 Nutritional anemia, unspecified: Secondary | ICD-10-CM | POA: Diagnosis not present

## 2019-04-19 LAB — POCT HEMOGLOBIN-HEMACUE: Hemoglobin: 12.4 g/dL (ref 12.0–15.0)

## 2019-04-19 MED ORDER — EPOETIN ALFA 10000 UNIT/ML IJ SOLN: 20000.0000 [IU] | INTRAMUSCULAR | Status: DC

## 2019-04-22 NOTE — Progress Notes (Signed)
Subjective: [name] is a 73 y.o. y.o. female who presents for preventative diabetic foot care today with PAD and cc of painful, discolored, thick toenails which interfere with daily activities. Pain is aggravated when wearing enclosed shoe gear. Pain is relieved with periodic professional debridement.  [name], MD is her PCP.   She recently had Vascular studies done.    Current Outpatient Medications:  .  ACCU-CHEK AVIVA PLUS test strip, USE AS INSTRUCTED THREE TIMES DAILY. DX E11.9, Disp: 100 each, Rfl: 12 .  acetaminophen (TYLENOL) 500 MG tablet, Take 1-2 tablets (500-1,000 mg total) by mouth every 8 (eight) hours as needed for mild pain., Disp: , Rfl:  .  allopurinol (ZYLOPRIM) 100 MG tablet, Take 100 mg by mouth daily., Disp: , Rfl:  .  amLODipine (NORVASC) 10 MG tablet, TAKE ONE (1) TABLET EACH DAY, Disp: 90 tablet, Rfl: 1 .  aspirin 81 MG tablet, Take 81 mg by mouth daily., Disp: , Rfl:  .  atorvastatin (LIPITOR) 80 MG tablet, TAKE 1 TABLET (80 MG TOTAL) BY MOUTH DAILY AT 6 PM., Disp: 90 tablet, Rfl: 3 .  BD PEN NEEDLE NANO U/F 32G X 4 MM MISC, USE DAILY WITH LANTUS SOLASTAR, Disp: 100 each, Rfl: 11 .  blood glucose meter kit and supplies KIT, Dispense based on patient and insurance preference. Use up to three times daily as directed. (FOR ICD-9 250.00, 250.01)., Disp: 1 each, Rfl: 0 .  calcitRIOL (ROCALTROL) 0.25 MCG capsule, Take 0.25 mcg by mouth daily., Disp: , Rfl:  .  calcitRIOL (ROCALTROL) 0.5 MCG capsule, Take by mouth daily., Disp: , Rfl:  .  clindamycin (CLEOCIN) 300 MG capsule, Take 1 capsule (300 mg total) by mouth 3 (three) times daily., Disp: 30 capsule, Rfl: 0 .  clopidogrel (PLAVIX) 75 MG tablet, TAKE 1 TABLET BY MOUTH EVERY DAY, Disp: 90 tablet, Rfl: 1 .  Cyanocobalamin (B-12 PO), Take 1 tablet by mouth daily., Disp: , Rfl:  .  diclofenac sodium (VOLTAREN) 1 % GEL, Apply 4 g topically 4 (four) times daily as needed., Disp: 400 g, Rfl: 11 .  ferrous  sulfate 325 (65 FE) MG tablet, TAKE 1 TABLET (325 MG TOTAL) BY MOUTH 2 (TWO) TIMES DAILY., Disp: 180 tablet, Rfl: 1 .  furosemide (LASIX) 80 MG tablet, Take 0.5 tablets (40 mg total) by mouth 2 (two) times daily., Disp: , Rfl:  .  hydrOXYzine (ATARAX/VISTARIL) 10 MG tablet, Take 1 tablet (10 mg total) by mouth 3 (three) times daily as needed for itching., Disp: 30 tablet, Rfl: 0 .  Insulin Glargine (LANTUS SOLOSTAR) 100 UNIT/ML Solostar Pen, Inject 10 Units into the skin daily., Disp: 5 pen, Rfl: 11 .  insulin lispro (HUMALOG KWIKPEN) 100 UNIT/ML KwikPen, INEJCT 0-9 UNITS AS DIRECTED INTO SKIN 3 TIMES A DAY, Disp: 15 mL, Rfl: 1 .  Lancets MISC, Use as directed three times daily with meals, Disp: 100 each, Rfl: 3 .  metoprolol tartrate (LOPRESSOR) 100 MG tablet, TAKE 1 TABLET BY MOUTH TWICE A DAY, Disp: 180 tablet, Rfl: 2 .  nitroGLYCERIN (NITROSTAT) 0.4 MG SL tablet, Place 1 tablet (0.4 mg total) under the tongue every 5 (five) minutes as needed for chest pain. Reported on 04/06/2016, Disp: 25 tablet, Rfl: 5 .  omeprazole (PRILOSEC) 20 MG capsule, TAKE 1 CAPSULE BY MOUTH EVERY DAY, Disp: 90 capsule, Rfl: 0 .  polyethylene glycol powder (GLYCOLAX/MIRALAX) powder, Take 17 gm by mouth daily, Disp: 3350 g, Rfl: 11 .  Potassium Chloride ER  20 MEQ TBCR, Take 20 mEq by mouth daily. , Disp: , Rfl: 6 .  risedronate (ACTONEL) 150 MG tablet, Take 1 tablet (150 mg total) by mouth every 30 (thirty) days. First Tuesday, Disp: 3 tablet, Rfl: 3 .  sodium bicarbonate 650 MG tablet, Take 650 mg by mouth 2 (two) times daily., Disp: , Rfl:  .  Tetrahydrozoline HCl (VISINE OP), Place 2 drops into both eyes daily as needed (DRY EYE)., Disp: , Rfl:  .  thiamine (VITAMIN B-1) 50 MG tablet, Take 1 tablet (50 mg total) by mouth daily., Disp: 90 tablet, Rfl: 1 .  timolol (BETIMOL) 0.5 % ophthalmic solution, Place 1 drop into both eyes daily. , Disp: , Rfl:  .  timolol (TIMOPTIC) 0.5 % ophthalmic solution, PLACE 1 DROP IN BOTH  EYES DAILY, Disp: , Rfl:  .  tiZANidine (ZANAFLEX) 4 MG tablet, TAKE 1 TABLET BY MOUTH EVERY 6 HOURS AS NEEDED FOR MUSCLE SPASMS, Disp: 60 tablet, Rfl: 2 .  traMADol (ULTRAM) 50 MG tablet, Take 1 tablet (50 mg total) by mouth every 6 (six) hours as needed., Disp: 120 tablet, Rfl: 2 .  zolpidem (AMBIEN) 5 MG tablet, TAKE 1 TABLET (5 MG TOTAL) BY MOUTH AT BEDTIME AS NEEDED FOR SLEEP., Disp: 30 tablet, Rfl: 5  No Known Allergies  Objective: Vitals:   04/10/19 1538  Temp: 97.9 F (36.6 C)    Vascular Examination: Capillary refill time <3 seconds x 10 digits  Dorsalis pedis pulses diminished left; faintly palpable right foot.  Posterior tibial pulses diminished left; faintly palpable right foot  No digital hair x 10 digits.   Skin temperature WNL b/l.  Nonpitting edema BLE.   Dermatological Examination: Skin thin, shiny and atrophic b/l.  Toenails 2-5 b/l discolored, thick, dystrophic with subungual debris and pain with palpation to nailbeds due to thickness of nails.   Anonychia b/l great toe(s) with evidence of permanent total nail avulsion. Nailbed(s) completely epithelialized and intact.  Musculoskeletal: Muscle strength 5/5 to all LE muscle groups.  Hammertoes b/l 2nd digit.  Neurological: Sensation intact 5/5 with 10 gram monofilament.  Vibratory sensation intact b/l.  Vas [place] Burn[place] Bunting With/wo Tbi  Result Date: 04/08/2019 LOWER EXTREMITY DOPPLER STUDY Indications: Claudication, peripheral artery disease, and CAD. High Risk         Hypertension, hyperlipidemia, Diabetes, past history of Factors:          smoking.  Vascular Interventions: Bilateral common iliac artery stents 08/13/2018. Performing Technologist: Alvia Grove RVT  Examination Guidelines: A complete evaluation includes at minimum, Doppler waveform signals and systolic blood pressure reading at the level of bilateral brachial, anterior tibial, and posterior tibial arteries, when vessel segments are accessible.  Bilateral testing is considered an integral part of a complete examination. Photoelectric Plethysmograph (PPG) waveforms and toe systolic pressure readings are included as required and additional duplex testing as needed. Limited examinations for reoccurring indications may be performed as noted.  ABI Findings: +---------+------------------+-----+--------+--------+  Right    Rt Pressure (mmHg)IndexWaveformComment  +---------+------------------+-----+--------+--------+  Brachial 216                                     +---------+------------------+-----+--------+--------+  PTA      254               1.18 biphasic         +---------+------------------+-----+--------+--------+  DP       139  0.64 biphasic         +---------+------------------+-----+--------+--------+  Great Toe86                0.40 Abnormal         +---------+------------------+-----+--------+--------+ +---------+------------------+-----+--------+-------+  Left     Lt Pressure (mmHg)IndexWaveformComment +---------+------------------+-----+--------+-------+  Brachial 203                                    +---------+------------------+-----+--------+-------+  PTA      119               0.55 biphasic        +---------+------------------+-----+--------+-------+ DP       131               0.61 biphasic        +---------+------------------+-----+--------+-------+ Great Toe77                0.36 Abnormal        +---------+------------------+-----+--------+-------+ +-------+-----------+-----------+------------+------------+  ABI/TBIToday's ABIToday's TBIPrevious ABIPrevious TBI +-------+-----------+-----------+------------+------------+  Right  [place]         0.40       [place]          0.33         +-------+-----------+-----------+------------+------------+  Left   0.61       0.36       0.58        0.40          +-------+-----------+-----------+------------+------------+  Bilateral ABIs and TBIs appear essentially unchanged.   Summary: Right: Resting right ankle-brachial index indicates noncompressible right lower extremity arteries.The right toe-brachial index is abnormal. Left: Resting left ankle-brachial index indicates moderate left lower extremity arterial disease. The left toe-brachial index is abnormal.  *See table(s) above for measurements and observations.  Electronically signed by Harold Barban MD on 04/08/2019 at 1:32:53 PM.    Final    Assessment: 1. Painful onychomycosis toenails 1-5 b/l 2. NIDDM with Peripheral arterial disease  Plan: 1. Continue diabetic foot care principles. Literature dispensed. 2. Toenails 2-5 b/l were debrided in length and girth without iatrogenic bleeding. 3. Patient to continue soft, supportive shoe gear daily. 4. Patient to report any pedal injuries to medical professional immediately. 5. Follow up 3 months.  6. Patient/POA to call should there be a concern in the interim.

## 2019-04-24 ENCOUNTER — Other Ambulatory Visit: Payer: Self-pay | Admitting: Internal Medicine

## 2019-04-26 ENCOUNTER — Other Ambulatory Visit: Payer: Self-pay | Admitting: Internal Medicine

## 2019-05-01 ENCOUNTER — Other Ambulatory Visit: Payer: Self-pay | Admitting: Internal Medicine

## 2019-05-02 ENCOUNTER — Other Ambulatory Visit: Payer: Self-pay

## 2019-05-02 DIAGNOSIS — N184 Chronic kidney disease, stage 4 (severe): Secondary | ICD-10-CM | POA: Diagnosis not present

## 2019-05-02 DIAGNOSIS — N189 Chronic kidney disease, unspecified: Secondary | ICD-10-CM | POA: Diagnosis not present

## 2019-05-02 DIAGNOSIS — Z1159 Encounter for screening for other viral diseases: Secondary | ICD-10-CM | POA: Diagnosis not present

## 2019-05-02 DIAGNOSIS — E1122 Type 2 diabetes mellitus with diabetic chronic kidney disease: Secondary | ICD-10-CM | POA: Diagnosis not present

## 2019-05-02 DIAGNOSIS — N2581 Secondary hyperparathyroidism of renal origin: Secondary | ICD-10-CM | POA: Diagnosis not present

## 2019-05-02 DIAGNOSIS — N2889 Other specified disorders of kidney and ureter: Secondary | ICD-10-CM | POA: Diagnosis not present

## 2019-05-02 DIAGNOSIS — D631 Anemia in chronic kidney disease: Secondary | ICD-10-CM | POA: Diagnosis not present

## 2019-05-03 ENCOUNTER — Encounter (HOSPITAL_COMMUNITY)
Admission: RE | Admit: 2019-05-03 | Discharge: 2019-05-03 | Disposition: A | Discharge status: Home / Self Care | Payer: Medicare Other | Source: Ambulatory Visit | Attending: Nephrology | Admitting: Nephrology

## 2019-05-03 VITALS — BP 133/83 | HR 79 | Temp 97.1°F | Resp 20

## 2019-05-03 DIAGNOSIS — D539 Nutritional anemia, unspecified: Secondary | ICD-10-CM | POA: Insufficient documentation

## 2019-05-03 LAB — POCT HEMOGLOBIN-HEMACUE: Hemoglobin: 11.6 g/dL — ABNORMAL LOW (ref 12.0–15.0)

## 2019-05-03 MED ORDER — EPOETIN ALFA 10000 UNIT/ML IJ SOLN
INTRAMUSCULAR | Status: AC
  Filled 2019-05-03: qty 1

## 2019-05-03 MED ORDER — EPOETIN ALFA 10000 UNIT/ML IJ SOLN
10000.0000 [IU] | INTRAMUSCULAR | Status: DC
  Administered 2019-05-03: 10000 [IU] via SUBCUTANEOUS

## 2019-05-05 ENCOUNTER — Other Ambulatory Visit: Payer: Self-pay | Admitting: Internal Medicine

## 2019-05-09 ENCOUNTER — Ambulatory Visit: Payer: Medicare Other | Admitting: Orthotics

## 2019-05-09 ENCOUNTER — Other Ambulatory Visit: Payer: Self-pay | Admitting: Internal Medicine

## 2019-05-09 ENCOUNTER — Other Ambulatory Visit: Payer: Self-pay

## 2019-05-09 DIAGNOSIS — M6788 Other specified disorders of synovium and tendon, other site: Secondary | ICD-10-CM

## 2019-05-09 DIAGNOSIS — E1151 Type 2 diabetes mellitus with diabetic peripheral angiopathy without gangrene: Secondary | ICD-10-CM

## 2019-05-09 DIAGNOSIS — I739 Peripheral vascular disease, unspecified: Secondary | ICD-10-CM

## 2019-05-09 NOTE — Progress Notes (Signed)

## 2019-05-10 ENCOUNTER — Encounter (HOSPITAL_COMMUNITY): Payer: Medicare Other

## 2019-05-15 ENCOUNTER — Other Ambulatory Visit: Payer: Self-pay | Admitting: Internal Medicine

## 2019-05-18 ENCOUNTER — Other Ambulatory Visit: Payer: Self-pay | Admitting: Internal Medicine

## 2019-05-24 ENCOUNTER — Other Ambulatory Visit: Payer: Self-pay | Admitting: Internal Medicine

## 2019-05-24 ENCOUNTER — Encounter (HOSPITAL_COMMUNITY): Payer: Medicare Other

## 2019-05-29 ENCOUNTER — Other Ambulatory Visit: Payer: Self-pay | Admitting: Internal Medicine

## 2019-06-03 ENCOUNTER — Ambulatory Visit (INDEPENDENT_AMBULATORY_CARE_PROVIDER_SITE_OTHER): Payer: Medicare Other | Admitting: *Deleted

## 2019-06-03 DIAGNOSIS — Z0000 Encounter for general adult medical examination without abnormal findings: Secondary | ICD-10-CM | POA: Diagnosis not present

## 2019-06-03 NOTE — Progress Notes (Addendum)
Subjective:   [name] is a 73 y.o. female who presents for Medicare Annual (Subsequent) preventive examination. I connected with patient by a telephone and verified that I am speaking with the correct person using two identifiers. Patient stated full name and DOB. Patient gave permission to continue with telephonic visit. Patient's location was at home and Nurse's location was at [place] office.   Review of Systems:   Cardiac Risk Factors include: advanced age (>2mn, >>80women);diabetes mellitus;dyslipidemia;hypertension Sleep patterns: feels rested on waking, gets up 1-3 times nightly to void and sleeps 7-8 hours nightly.    Home Safety/Smoke Alarms: Feels safe in home. Smoke alarms in place.  Living environment; residence and Firearm Safety: 1-story house/ trailer, equipment: CRadio producer Type: [place] Walk[place], Type: RConservation officer, nature Seat Belt Safety/Bike Helmet: Wears seat belt. Lives with daughter , no needs for DME, good support system     Objective:     Vitals: There were no vitals taken for this visit.  There is no height or weight on file to calculate BMI.  Advanced Directives 06/03/2019 08/13/2018 07/26/2018 07/25/2018 05/29/2018 12/26/2017 11/16/2017  Does Patient Have a Medical Advance Directive? _0  No No  Does patient want to make changes to medical advance directive? - - - - - - -  Would patient like information on creating a medical advance directive? No - Patient declined No - Patient declined No - Patient declined - No - Patient declined - No - Patient declined  Pre-existing out of facility DNR order (yellow form or pink MOST form) - - - - - - -    Tobacco Social History   Tobacco Use  Smoking Status Former Smoker   Quit date: 10/25/2007   Years since quitting: 11.6  Smokeless Tobacco Never Used  Tobacco Comment   Stopped 1.5 yrs ago January 2009     Counseling given: Not Answered Comment: Stopped 1.5 yrs ago January 2009  Past  Medical History:  Diagnosis Date   Anemia, iron deficiency    Aortic atherosclerosis ([place] 11/17/2017   Arthritis    CAD (coronary artery disease)    Carotid stenosis    Carotid [place](02[place]):  Bilateral ICA 40-59%; > 50% L ECA; F/u 1 year   Cataract    removed both eyes   Cholelithiasis 11/17/2017   Chronic kidney disease    chronic renal failure   DM2 (diabetes mellitus, type 2) (HCC)    GERD (gastroesophageal reflux disease)    Glaucoma    HLD (hyperlipidemia)    HTN (hypertension)    Hx of cardiovascular stress test    Lexiscan Myoview (03/24/14):  No ischemia, EF 73%, Normal   Hypoglycemia 06/27/2017   Mitral regurgitation    Osteopenia 07/21/2017   Osteoporosis    PUD (peptic ulcer disease)    PVD (peripheral vascular disease) (HCC)    Shortness of breath    with exertion   Past Surgical History:  Procedure Laterality Date   ABDOMINAL AORTOGRAM W/LOWER EXTREMITY N/A 08/13/2018   Procedure: ABDOMINAL AORTOGRAM W/LOWER EXTREMITY;  Surgeon: CWaynetta Sandy MD;  [facility];  Service: Cardiovascular;  Laterality: N/A;   ABDOMINAL HYSTERECTOMY     APPENDECTOMY     AV FISTULA PLACEMENT Right 01/18/2013   Procedure: ARTERIOVENOUS (AV) FISTULA CREATION;  Surgeon: TRosetta Posner MD;  [facility]  Service: Vascular;  Laterality: Right;  Ultrasound guided   COLONOSCOPY     CORONARY ANGIOPLASTY WITH  STENT PLACEMENT     PERIPHERAL VASCULAR INTERVENTION Bilateral 08/13/2018   Procedure: PERIPHERAL VASCULAR INTERVENTION;  Surgeon: Waynetta Sandy, MD;  Location: [facility];  Service: Cardiovascular;  Laterality: Bilateral;   RENAL ANGIOGRAM N/A 09/16/2011   Procedure: RENAL ANGIOGRAM;  Surgeon: Sherren Mocha, MD;  Location[facility] LAB;  Service: Cardiovascular;  Laterality: N/A;   Family History  Problem Relation Age of Onset   Heart disease Father    Glaucoma Father    Diabetes Sister    Diabetes Son     Colon cancer Neg Hx    Colon polyps Neg Hx    Esophageal cancer Neg Hx    Rectal cancer Neg Hx    Stomach cancer Neg Hx    Social History   Socioeconomic History   Marital status: Single    Spouse name: Not on file   Number of children: 3   Years of education: Not on file   Highest education level: Not on file  Occupational History   Occupation: homemaker  Social Needs   Financial resource strain: Not hard at all   Food insecurity    Worry: Never true    Inability: Never true   Transportation needs    Medical: No    Non-medical: No  Tobacco Use   Smoking status: Former Smoker    Quit date: 10/25/2007    Years since quitting: 11.6   Smokeless tobacco: Never Used   Tobacco comment: Stopped 1.5 yrs ago January 2009  Substance and Sexual Activity   Alcohol use: No   Drug use: No   Sexual activity: Never  Lifestyle   Physical activity    Days per week: 0 days    Minutes per session: 0 min   Stress: Not at all  Relationships   Social connections    Talks on phone: More than three times a week    Gets together: More than three times a week    Attends religious service: Not on file    Active member of club or organization: Not on file    Attends meetings of clubs or organizations: Not on file    Relationship status: Not on file  Other Topics Concern   Not on file  Social History Narrative   Not on file    Outpatient Encounter Medications as of 06/03/2019  Medication Sig   ACCU-CHEK AVIVA PLUS test strip USE AS INSTRUCTED THREE TIMES DAILY. DX E11.9   acetaminophen (TYLENOL) 500 MG tablet Take 1-2 tablets (500-1,000 mg total) by mouth every 8 (eight) hours as needed for mild pain.   allopurinol (ZYLOPRIM) 100 MG tablet Take 100 mg by mouth daily.   amLODipine (NORVASC) 10 MG tablet TAKE 1 TABLET BY MOUTH EVERY DAY   aspirin 81 MG tablet Take 81 mg by mouth daily.   atorvastatin (LIPITOR) 80 MG tablet TAKE 1 TABLET (80 MG TOTAL) BY  MOUTH DAILY AT 6 PM.   BD PEN NEEDLE NANO U/F 32G X 4 MM MISC USE DAILY WITH LANTUS SOLASTAR   blood glucose meter kit and supplies KIT Dispense based on patient and insurance preference. Use up to three times daily as directed. (FOR ICD-9 250.00, 250.01).   calcitRIOL (ROCALTROL) 0.25 MCG capsule Take 0.25 mcg by mouth daily.   calcitRIOL (ROCALTROL) 0.5 MCG capsule Take by mouth daily.   clopidogrel (PLAVIX) 75 MG tablet TAKE 1 TABLET BY MOUTH EVERY DAY   Cyanocobalamin (B-12 PO) Take 1 tablet by mouth daily.  diclofenac sodium (VOLTAREN) 1 % GEL Apply 4 g topically 4 (four) times daily as needed.   ferrous sulfate 325 (65 FE) MG tablet TAKE 1 TABLET BY MOUTH TWICE A DAY   furosemide (LASIX) 80 MG tablet Take 0.5 tablets (40 mg total) by mouth 2 (two) times daily.   hydrOXYzine (ATARAX/VISTARIL) 10 MG tablet Take 1 tablet (10 mg total) by mouth 3 (three) times daily as needed for itching.   Insulin Glargine (LANTUS SOLOSTAR) 100 UNIT/ML Solostar Pen Inject 10 Units into the skin daily.   insulin lispro (HUMALOG KWIKPEN) 100 UNIT/ML KwikPen INEJCT 0-9 UNITS AS DIRECTED INTO SKIN 3 TIMES A DAY   Lancets MISC Use as directed three times daily with meals   metoprolol tartrate (LOPRESSOR) 100 MG tablet TAKE 1 TABLET BY MOUTH TWICE A DAY   nitroGLYCERIN (NITROSTAT) 0.4 MG SL tablet Place 1 tablet (0.4 mg total) under the tongue every 5 (five) minutes as needed for chest pain. Reported on 04/06/2016   omeprazole (PRILOSEC) 20 MG capsule TAKE 1 CAPSULE BY MOUTH EVERY DAY   polyethylene glycol powder (GLYCOLAX/MIRALAX) powder Take 17 gm by mouth daily   Potassium Chloride ER 20 MEQ TBCR Take 20 mEq by mouth daily.    risedronate (ACTONEL) 150 MG tablet Take 1 tablet (150 mg total) by mouth every 30 (thirty) days. First Tuesday   sodium bicarbonate 650 MG tablet Take 650 mg by mouth 2 (two) times daily.   Tetrahydrozoline HCl (VISINE OP) Place 2 drops into both eyes daily as needed  (DRY EYE).   thiamine (VITAMIN B-1) 50 MG tablet Take 1 tablet (50 mg total) by mouth daily.   timolol (TIMOPTIC) 0.5 % ophthalmic solution PLACE 1 DROP IN BOTH EYES DAILY   tiZANidine (ZANAFLEX) 4 MG tablet TAKE 1 TABLET BY MOUTH EVERY 6 HOURS AS NEEDED FOR MUSCLE SPASMS   traMADol (ULTRAM) 50 MG tablet Take 1 tablet (50 mg total) by mouth every 6 (six) hours as needed.   zolpidem (AMBIEN) 5 MG tablet TAKE 1 TABLET (5 MG TOTAL) BY MOUTH AT BEDTIME AS NEEDED FOR SLEEP.   [DISCONTINUED] clindamycin (CLEOCIN) 300 MG capsule Take 1 capsule (300 mg total) by mouth 3 (three) times daily. (Patient not taking: Reported on 06/03/2019)   [DISCONTINUED] timolol (BETIMOL) 0.5 % ophthalmic solution Place 1 drop into both eyes daily.    No facility-administered encounter medications on file as of 06/03/2019.     Activities of Daily Living In your present state of health, do you have any difficulty performing the following activities: 06/03/2019 07/26/2018  Hearing? N N  Vision? N N  Difficulty concentrating or making decisions? N N  Walking or climbing stairs? Y N  Dressing or bathing? N N  Doing errands, shopping? Y N  Preparing Food and eating ? N -  Using the Toilet? N -  In the past six months, have you accidently leaked urine? N -  Do you have problems with loss of bowel control? N -  Managing your Medications? N -  Managing your Finances? N -  Housekeeping or managing your Housekeeping? N -  Some recent data might be hidden    Patient Care Team: [name], MD as PCP - Cyndia Diver, MD as PCP - Cardiology (Cardiology) Edrick Oh, MD as Consulting Physician (Nephrology) Trula Slade, DPM as Consulting Physician (Podiatry) Gatha Mayer, MD as Consulting Physician (Gastroenterology)    Assessment:   This is a routine wellness examination for [name]. Physical  assessment deferred to PCP.   Exercise Activities and Dietary recommendations Current Exercise  Habits: Home exercise routine, Type of exercise: walking(chair exersice), Time (Minutes): 30, Frequency (Times/Week): 5, Weekly Exercise (Minutes/Week): 150, Exercise limited by: orthopedic condition(s)  Diet (meal preparation, eat out, water intake, caffeinated beverages, dairy products, fruits and vegetables): in general, a "healthy" diet  , well balanced   Reviewed heart healthy and diabetic diet. Encouraged patient to increase daily water and healthy fluid intake.  Goals     Patient Stated     Stay as active and as independent as possible. I want to become more active socially by checking out [facility] or [facility].     Start to walk with my daughter x 2 weekly       Fall Risk Fall Risk  06/03/2019 12/20/2018 05/29/2018 12/13/2017 06/08/2017  Falls in the past year? 0 0 Yes No No  Number falls in past yr: 0 - 1 - -  Injury with Fall? 0 - No - -  Risk for fall due to : Impaired balance/gait;Impaired mobility - Impaired balance/gait;Impaired mobility - -  Follow up - - Falls prevention discussed - -    Depression Screen PHQ 2/9 Scores 06/03/2019 12/20/2018 05/29/2018 12/13/2017  PHQ - 2 Score 0 0 0 0  PHQ- 9 Score - - 1 -     Cognitive Function       Ad8 score reviewed for issues:  Issues making decisions: no  Less interest in hobbies / activities: no  Repeats questions, stories (family complaining): no  Trouble using ordinary gadgets (microwave, computer, phone):no  Forgets the month or year: no  Mismanaging finances: no  Remembering appts: no  Daily problems with thinking and/or memory: no Ad8 score is= 0  Immunization History  Administered Date(s) Administered   H1N1 12/18/2008   Influenza Whole 09/30/2009, 10/07/2010   Influenza, High Dose Seasonal PF 07/06/2017, 08/02/2018   Influenza, Seasonal, Injecte, Preservative Fre 08/02/2013   Influenza-Unspecified 07/25/2015, 08/25/2015   Pneumococcal Conjugate-13 09/13/2013   Pneumococcal  Polysaccharide-23 06/05/2006, 05/11/2011   Td 12/18/2008   Tdap 12/20/2018   Screening Tests Health Maintenance  Topic Date Due   OPHTHALMOLOGY EXAM  11/20/2018   URINE MICROALBUMIN  11/23/2018   INFLUENZA VACCINE  05/25/2019   HEMOGLOBIN A1C  06/20/2019   FOOT EXAM  12/21/2019   COLONOSCOPY  07/22/2026   TETANUS/TDAP  12/20/2028   DEXA SCAN  Completed   Hepatitis C Screening  Completed   PNA vac Low Risk Adult  Completed      Plan:     Reviewed health maintenance screenings with patient today and relevant education, vaccines, and/or referrals were provided.   Continue to eat heart healthy diet (full of fruits, vegetables, whole grains, lean protein, water--limit salt, fat, and sugar intake) and increase physical activity as tolerated.  Continue doing brain stimulating activities (puzzles, reading, adult coloring books, staying active) to keep memory sharp.  I have personally reviewed and noted the following in the patients chart:    Medical and social history  Use of alcohol, tobacco or illicit drugs   Current medications and supplements  Functional ability and status  Nutritional status  Physical activity  Advanced directives  List of other physicians  Screenings to include cognitive, depression, and falls  Referrals and appointments  In addition, I have reviewed and discussed with patient certain preventive protocols, quality metrics, and best practice recommendations. A written personalized care plan for preventive services as well as general preventive  health recommendations were provided to patient.     Michiel Cowboy, RN  06/03/2019    Medical screening examination/treatment/procedure(s) were performed by non-physician practitioner and as supervising physician I was immediately available for consultation/collaboration. I agree with above. Cathlean Cower, MD

## 2019-06-07 ENCOUNTER — Other Ambulatory Visit: Payer: Self-pay

## 2019-06-07 ENCOUNTER — Encounter (HOSPITAL_COMMUNITY)
Admission: RE | Admit: 2019-06-07 | Discharge: 2019-06-07 | Disposition: A | Discharge status: Home / Self Care | Payer: Medicare Other | Source: Ambulatory Visit | Attending: Nephrology | Admitting: Nephrology

## 2019-06-07 VITALS — BP 130/65 | HR 71 | Temp 97.5°F | Resp 20

## 2019-06-07 DIAGNOSIS — D539 Nutritional anemia, unspecified: Secondary | ICD-10-CM | POA: Diagnosis not present

## 2019-06-07 LAB — FERRITIN: Ferritin: 979 ng/mL — ABNORMAL HIGH (ref 11–307)

## 2019-06-07 LAB — POCT HEMOGLOBIN-HEMACUE: Hemoglobin: 11.7 g/dL — ABNORMAL LOW (ref 12.0–15.0)

## 2019-06-07 LAB — IRON AND TIBC
Iron: 52 ug/dL (ref 28–170)
Saturation Ratios: 22 % (ref 10.4–31.8)
TIBC: 237 ug/dL — ABNORMAL LOW (ref 250–450)
UIBC: 185 ug/dL

## 2019-06-07 MED ORDER — EPOETIN ALFA 10000 UNIT/ML IJ SOLN
10000.0000 [IU] | INTRAMUSCULAR | Status: DC
  Administered 2019-06-07: 10000 [IU] via SUBCUTANEOUS

## 2019-06-07 MED ORDER — EPOETIN ALFA 10000 UNIT/ML IJ SOLN
INTRAMUSCULAR | Status: AC
  Filled 2019-06-07: qty 1

## 2019-06-12 ENCOUNTER — Other Ambulatory Visit: Payer: Self-pay | Admitting: Internal Medicine

## 2019-06-20 ENCOUNTER — Other Ambulatory Visit: Payer: Self-pay

## 2019-06-20 ENCOUNTER — Encounter: Payer: Self-pay | Admitting: Internal Medicine

## 2019-06-20 ENCOUNTER — Other Ambulatory Visit: Payer: Self-pay | Admitting: Internal Medicine

## 2019-06-20 ENCOUNTER — Ambulatory Visit (INDEPENDENT_AMBULATORY_CARE_PROVIDER_SITE_OTHER): Payer: Medicare Other | Admitting: Internal Medicine

## 2019-06-20 ENCOUNTER — Other Ambulatory Visit (INDEPENDENT_AMBULATORY_CARE_PROVIDER_SITE_OTHER): Payer: Medicare Other

## 2019-06-20 VITALS — BP 162/88 | HR 83 | Temp 97.9°F | Ht 61.0 in | Wt 237.2 lb

## 2019-06-20 DIAGNOSIS — E785 Hyperlipidemia, unspecified: Secondary | ICD-10-CM

## 2019-06-20 DIAGNOSIS — N185 Chronic kidney disease, stage 5: Secondary | ICD-10-CM

## 2019-06-20 DIAGNOSIS — E538 Deficiency of other specified B group vitamins: Secondary | ICD-10-CM

## 2019-06-20 DIAGNOSIS — E559 Vitamin D deficiency, unspecified: Secondary | ICD-10-CM | POA: Diagnosis not present

## 2019-06-20 DIAGNOSIS — E611 Iron deficiency: Secondary | ICD-10-CM

## 2019-06-20 DIAGNOSIS — E1022 Type 1 diabetes mellitus with diabetic chronic kidney disease: Secondary | ICD-10-CM | POA: Diagnosis not present

## 2019-06-20 DIAGNOSIS — E118 Type 2 diabetes mellitus with unspecified complications: Secondary | ICD-10-CM

## 2019-06-20 DIAGNOSIS — I10 Essential (primary) hypertension: Secondary | ICD-10-CM

## 2019-06-20 DIAGNOSIS — Z23 Encounter for immunization: Secondary | ICD-10-CM | POA: Diagnosis not present

## 2019-06-20 LAB — URINALYSIS, ROUTINE W REFLEX MICROSCOPIC
Bilirubin Urine: NEGATIVE
Hgb urine dipstick: NEGATIVE
Ketones, ur: NEGATIVE
Leukocytes,Ua: NEGATIVE
Nitrite: NEGATIVE
RBC / HPF: NONE SEEN (ref 0–?)
Specific Gravity, Urine: 1.015 (ref 1.000–1.030)
Total Protein, Urine: 100 — AB
Urine Glucose: NEGATIVE
Urobilinogen, UA: 0.2 (ref 0.0–1.0)
pH: 6.5 (ref 5.0–8.0)

## 2019-06-20 LAB — MICROALBUMIN / CREATININE URINE RATIO
Creatinine,U: 42.3 mg/dL
Microalb Creat Ratio: 133.5 mg/g — ABNORMAL HIGH (ref 0.0–30.0)
Microalb, Ur: 56.5 mg/dL — ABNORMAL HIGH (ref 0.0–1.9)

## 2019-06-20 LAB — BASIC METABOLIC PANEL
BUN: 39 mg/dL — ABNORMAL HIGH (ref 6–23)
CO2: 26 mEq/L (ref 19–32)
Calcium: 9.9 mg/dL (ref 8.4–10.5)
Chloride: 101 mEq/L (ref 96–112)
Creatinine, Ser: 2.33 mg/dL — ABNORMAL HIGH (ref 0.40–1.20)
GFR: 24.64 mL/min — ABNORMAL LOW (ref >60.00)
Glucose, Bld: 206 mg/dL — ABNORMAL HIGH (ref 70–99)
Potassium: 4 mEq/L (ref 3.5–5.1)
Sodium: 139 mEq/L (ref 135–145)

## 2019-06-20 LAB — LIPID PANEL
Cholesterol: 188 mg/dL (ref 0–200)
HDL: 41.8 mg/dL (ref >39.00)
NonHDL: 145.84
Total CHOL/HDL Ratio: 4
Triglycerides: 206 mg/dL — ABNORMAL HIGH (ref 0.0–149.0)
VLDL: 41.2 mg/dL — ABNORMAL HIGH (ref 0.0–40.0)

## 2019-06-20 LAB — HEPATIC FUNCTION PANEL
ALT: 14 U/L (ref 0–35)
AST: 13 U/L (ref 0–37)
Albumin: 4 g/dL (ref 3.5–5.2)
Alkaline Phosphatase: 114 U/L (ref 39–117)
Bilirubin, Direct: 0.1 mg/dL (ref 0.0–0.3)
Total Bilirubin: 0.5 mg/dL (ref 0.2–1.2)
Total Protein: 7.6 g/dL (ref 6.0–8.3)

## 2019-06-20 LAB — VITAMIN B12: Vitamin B-12: 305 pg/mL (ref 211–911)

## 2019-06-20 LAB — CBC WITH DIFFERENTIAL/PLATELET
Basophils Absolute: 0 10*3/uL (ref 0.0–0.1)
Basophils Relative: 0.3 % (ref 0.0–3.0)
Eosinophils Absolute: 0.4 10*3/uL (ref 0.0–0.7)
Eosinophils Relative: 4.2 % (ref 0.0–5.0)
HCT: 36 % (ref 36.0–46.0)
Hemoglobin: 11.8 g/dL — ABNORMAL LOW (ref 12.0–15.0)
Lymphocytes Relative: 19 % (ref 12.0–46.0)
Lymphs Abs: 1.9 10*3/uL (ref 0.7–4.0)
MCHC: 32.9 g/dL (ref 30.0–36.0)
MCV: 84.3 fl (ref 78.0–100.0)
Monocytes Absolute: 0.6 10*3/uL (ref 0.1–1.0)
Monocytes Relative: 6.3 % (ref 3.0–12.0)
Neutro Abs: 7 10*3/uL (ref 1.4–7.7)
Neutrophils Relative %: 70.2 % (ref 43.0–77.0)
Platelets: 367 10*3/uL (ref 150.0–400.0)
RBC: 4.27 Mil/uL (ref 3.87–5.11)
RDW: 16.3 % — ABNORMAL HIGH (ref 11.5–15.5)
WBC: 9.9 10*3/uL (ref 4.0–10.5)

## 2019-06-20 LAB — HEMOGLOBIN A1C: Hgb A1c MFr Bld: 8.1 % — ABNORMAL HIGH (ref 4.6–6.5)

## 2019-06-20 LAB — VITAMIN D 25 HYDROXY (VIT D DEFICIENCY, FRACTURES): VITD: 9.61 ng/mL — ABNORMAL LOW (ref 30.00–100.00)

## 2019-06-20 LAB — IBC PANEL
Iron: 43 ug/dL (ref 42–145)
Saturation Ratios: 15.9 % — ABNORMAL LOW (ref 20.0–50.0)
Transferrin: 193 mg/dL — ABNORMAL LOW (ref 212.0–360.0)

## 2019-06-20 LAB — TSH: TSH: 3.34 u[IU]/mL (ref 0.35–4.50)

## 2019-06-20 LAB — LDL CHOLESTEROL, DIRECT: Direct LDL: 101 mg/dL

## 2019-06-20 MED ORDER — LANTUS SOLOSTAR 100 UNIT/ML ~~LOC~~ SOPN: 15.0000 [IU] | PEN_INJECTOR | Freq: Every day | SUBCUTANEOUS | 11 refills | Status: DC

## 2019-06-20 MED ORDER — VITAMIN D (ERGOCALCIFEROL) 1.25 MG (50000 UNIT) PO CAPS: 50000.0000 [IU] | ORAL_CAPSULE | ORAL | 0 refills | Status: DC

## 2019-06-20 NOTE — Assessment & Plan Note (Addendum)
Mild uncontrolled, o/w stable overall by history and exam, recent data reviewed with pt, and pt to continue medical treatment as before - declines med change,  to f/u any worsening symptoms or concerns

## 2019-06-20 NOTE — Assessment & Plan Note (Signed)
stable overall by history and exam, recent data reviewed with pt, and pt to continue medical treatment as before,  to f/u any worsening symptoms or concerns  

## 2019-06-20 NOTE — Patient Instructions (Signed)

## 2019-06-20 NOTE — Progress Notes (Signed)
Subjective:    Patient ID: [name], female    DOB: [birth year], 73 y.o.   MRN: [id number]  HPI  Here to f/u; overall doing ok,  Pt denies chest pain, increasing sob or doe, wheezing, orthopnea, PND, increased LE swelling, palpitations, dizziness or syncope.  Pt denies new neurological symptoms such as new headache, or facial or extremity weakness or numbness.  Pt denies polydipsia, polyuria, or low sugar episode.  Pt states overall good compliance with meds, mostly trying to follow appropriate diet, with wt overall stable,  but little exercise however.  CBGs running mid 100's per pt.   Pt denies fever, wt loss, night sweats, loss of appetite, or other constitutional symptoms Wt Readings from Last 3 Encounters:  06/20/19 237 lb 3.2 oz (107.6 kg)  04/08/19 225 lb (102.1 kg)  01/04/19 223 lb (101.2 kg)  BP at home was 137/60 BP Readings from Last 3 Encounters:  06/20/19 (!) 162/88  06/07/19 130/65  05/03/19 133/83   Past Medical History:  Diagnosis Date  . Anemia, iron deficiency   . Aortic atherosclerosis ([place]) 11/17/2017  . Arthritis   . CAD (coronary artery disease)   . Carotid stenosis    Carotid US (02/2014):  Bilateral ICA 40-59%; > 50% L ECA; F/u 1 year  . Cataract    removed both eyes  . Cholelithiasis 11/17/2017  . Chronic kidney disease    chronic renal failure  . DM2 (diabetes mellitus, type 2) ([place])   . GERD (gastroesophageal reflux disease)   . Glaucoma   . HLD (hyperlipidemia)   . HTN (hypertension)   . Hx of cardiovascular stress test    Lexiscan Myoview (03/24/14):  No ischemia, EF 73%, Normal  . Hypoglycemia 06/27/2017  . Mitral regurgitation   . Osteopenia 07/21/2017  . Osteoporosis   . PUD (peptic ulcer disease)   . PVD (peripheral vascular disease) ([place])   . Shortness of breath    with exertion   Past Surgical History:  Procedure Laterality Date  . ABDOMINAL AORTOGRAM W/LOWER EXTREMITY N/A 08/13/2018   Procedure: ABDOMINAL AORTOGRAM W/LOWER  EXTREMITY;  Surgeon: Waynetta Sandy, MD;  Location: [place] [facility];  Service: Cardiovascular;  Laterality: N/A;  . ABDOMINAL HYSTERECTOMY    . APPENDECTOMY    . AV FISTULA PLACEMENT Right 01/18/2013   Procedure: ARTERIOVENOUS (AV) FISTULA CREATION;  Surgeon: Rosetta Posner, MD;  Location: [place];  Service: Vascular;  Laterality: Right;  Ultrasound guided  . COLONOSCOPY    . CORONARY ANGIOPLASTY WITH STENT PLACEMENT    . PERIPHERAL VASCULAR INTERVENTION Bilateral 08/13/2018   Procedure: PERIPHERAL VASCULAR INTERVENTION;  Surgeon: Waynetta Sandy, MD;  Location: [place] [facility];  Service: Cardiovascular;  Laterality: Bilateral;  . RENAL ANGIOGRAM N/A 09/16/2011   Procedure: RENAL ANGIOGRAM;  Surgeon: Sherren Mocha, MD;  Location: [facility] CATH LAB;  Service: Cardiovascular;  Laterality: N/A;    reports that she quit smoking about 11 years ago. She has never used smokeless tobacco. She reports that she does not drink alcohol or use drugs. family history includes Diabetes in her sister and son; Glaucoma in her father; Heart disease in her father. No Known Allergies Current Outpatient Medications on File Prior to Visit  Medication Sig Dispense Refill  . ACCU-CHEK AVIVA PLUS test strip USE AS INSTRUCTED THREE TIMES DAILY. DX E11.9 100 each 12  . acetaminophen (TYLENOL) 500 MG tablet Take 1-2 tablets (500-1,000 mg total) by mouth every 8 (eight) hours as needed for mild  pain.    . allopurinol (ZYLOPRIM) 100 MG tablet Take 100 mg by mouth daily.    Marland Kitchen amLODipine (NORVASC) 10 MG tablet TAKE 1 TABLET BY MOUTH EVERY DAY 90 tablet 1  . aspirin 81 MG tablet Take 81 mg by mouth daily.    Marland Kitchen atorvastatin (LIPITOR) 80 MG tablet TAKE 1 TABLET (80 MG TOTAL) BY MOUTH DAILY AT 6 PM. 90 tablet 3  . BD PEN NEEDLE NANO U/F 32G X 4 MM MISC USE DAILY WITH LANTUS SOLASTAR 100 each 11  . blood glucose meter kit and supplies KIT Dispense based on patient and insurance preference. Use up to three times  daily as directed. (FOR ICD-9 250.00, 250.01). 1 each 0  . calcitRIOL (ROCALTROL) 0.25 MCG capsule Take 0.25 mcg by mouth daily.    . calcitRIOL (ROCALTROL) 0.5 MCG capsule Take by mouth daily.    . clopidogrel (PLAVIX) 75 MG tablet TAKE 1 TABLET BY MOUTH EVERY DAY 90 tablet 1  . Cyanocobalamin (B-12 PO) Take 1 tablet by mouth daily.    . diclofenac sodium (VOLTAREN) 1 % GEL Apply 4 g topically 4 (four) times daily as needed. 400 g 11  . ferrous sulfate 325 (65 FE) MG tablet TAKE 1 TABLET BY MOUTH TWICE A DAY 180 tablet 1  . furosemide (LASIX) 80 MG tablet Take 0.5 tablets (40 mg total) by mouth 2 (two) times daily.    . hydrOXYzine (ATARAX/VISTARIL) 10 MG tablet Take 1 tablet (10 mg total) by mouth 3 (three) times daily as needed for itching. 30 tablet 0  . insulin lispro (HUMALOG KWIKPEN) 100 UNIT/ML KwikPen INEJCT 0-9 UNITS AS DIRECTED INTO SKIN 3 TIMES A DAY 15 mL 3  . Lancets MISC Use as directed three times daily with meals 100 each 3  . metoprolol tartrate (LOPRESSOR) 100 MG tablet TAKE 1 TABLET BY MOUTH TWICE A DAY 180 tablet 2  . nitroGLYCERIN (NITROSTAT) 0.4 MG SL tablet Place 1 tablet (0.4 mg total) under the tongue every 5 (five) minutes as needed for chest pain. Reported on 04/06/2016 25 tablet 5  . omeprazole (PRILOSEC) 20 MG capsule TAKE 1 CAPSULE BY MOUTH EVERY DAY 90 capsule 0  . polyethylene glycol powder (GLYCOLAX/MIRALAX) powder Take 17 gm by mouth daily 3350 g 11  . Potassium Chloride ER 20 MEQ TBCR Take 20 mEq by mouth daily.   6  . risedronate (ACTONEL) 150 MG tablet Take 1 tablet (150 mg total) by mouth every 30 (thirty) days. First Tuesday 3 tablet 3  . sodium bicarbonate 650 MG tablet Take 650 mg by mouth 2 (two) times daily.    . Tetrahydrozoline HCl (VISINE OP) Place 2 drops into both eyes daily as needed (DRY EYE).    Marland Kitchen thiamine (VITAMIN B-1) 50 MG tablet Take 1 tablet (50 mg total) by mouth daily. 90 tablet 1  . timolol (TIMOPTIC) 0.5 % ophthalmic solution PLACE 1  DROP IN BOTH EYES DAILY    . tiZANidine (ZANAFLEX) 4 MG tablet TAKE 1 TABLET BY MOUTH EVERY 6 HOURS AS NEEDED FOR MUSCLE SPASMS 60 tablet 2  . traMADol (ULTRAM) 50 MG tablet Take 1 tablet (50 mg total) by mouth every 6 (six) hours as needed. 120 tablet 2  . zolpidem (AMBIEN) 5 MG tablet TAKE 1 TABLET (5 MG TOTAL) BY MOUTH AT BEDTIME AS NEEDED FOR SLEEP. 30 tablet 5   No current facility-administered medications on file prior to visit.    Review of Systems  Constitutional: Negative for other  unusual diaphoresis or sweats HENT: Negative for ear discharge or swelling Eyes: Negative for other worsening visual disturbances Respiratory: Negative for stridor or other swelling  Gastrointestinal: Negative for worsening distension or other blood Genitourinary: Negative for retention or other urinary change Musculoskeletal: Negative for other MSK pain or swelling Skin: Negative for color change or other new lesions Neurological: Negative for worsening tremors and other numbness  Psychiatric/Behavioral: Negative for worsening agitation or other fatigue All other system neg per pt    Objective:   Physical Exam BP (!) 162/88 (BP Location: Left Arm, Patient Position: Sitting, Cuff Size: Large)   Pulse 83   Temp 97.9 F (36.6 C) (Oral)   Ht '5\' 1"'$  (1.549 m)   Wt 237 lb 3.2 oz (107.6 kg)   SpO2 94%   BMI 44.82 kg/m  VS noted,  Constitutional: Pt appears in NAD HENT: Head: NCAT.  Right Ear: External ear normal.  Left Ear: External ear normal.  Eyes: . Pupils are equal, round, and reactive to light. Conjunctivae and EOM are normal Nose: without d/c or deformity Neck: Neck supple. Gross normal ROM Cardiovascular: Normal rate and regular rhythm.   Pulmonary/Chest: Effort normal and breath sounds without rales or wheezing.  Neurological: Pt is alert. At baseline orientation, motor grossly intact Skin: Skin is warm. No rashes, other new lesions, no LE edema Psychiatric: Pt behavior is normal  without agitation  No other exam findings Lab Results  Component Value Date   WBC 9.9 06/20/2019   HGB 11.8 (L) 06/20/2019   HCT 36.0 06/20/2019   PLT 367.0 06/20/2019   GLUCOSE 206 (H) 06/20/2019   CHOL 188 06/20/2019   TRIG 206.0 (H) 06/20/2019   HDL 41.80 06/20/2019   LDLDIRECT 101.0 06/20/2019   LDLCALC 81 06/14/2018   ALT 14 06/20/2019   AST 13 06/20/2019   NA 139 06/20/2019   K 4.0 06/20/2019   CL 101 06/20/2019   CREATININE 2.33 (H) 06/20/2019   BUN 39 (H) 06/20/2019   CO2 26 06/20/2019   TSH 3.34 06/20/2019   HGBA1C 8.1 (H) 06/20/2019   MICROALBUR 56.5 (H) 06/20/2019        Assessment & Plan:

## 2019-06-21 ENCOUNTER — Ambulatory Visit: Payer: Medicare Other | Admitting: Orthotics

## 2019-06-21 ENCOUNTER — Telehealth: Payer: Self-pay

## 2019-06-21 ENCOUNTER — Other Ambulatory Visit: Payer: Self-pay

## 2019-06-21 DIAGNOSIS — M2041 Other hammer toe(s) (acquired), right foot: Secondary | ICD-10-CM | POA: Diagnosis not present

## 2019-06-21 DIAGNOSIS — M2042 Other hammer toe(s) (acquired), left foot: Secondary | ICD-10-CM | POA: Diagnosis not present

## 2019-06-21 DIAGNOSIS — E1151 Type 2 diabetes mellitus with diabetic peripheral angiopathy without gangrene: Secondary | ICD-10-CM | POA: Diagnosis not present

## 2019-06-21 NOTE — Telephone Encounter (Signed)
Pt has been informed of results and expressed understanding.  °

## 2019-06-21 NOTE — Telephone Encounter (Signed)
-----   Message from [name], MD sent at 06/20/2019  7:25 PM EDT ----- Left message on MyChart, pt to cont same tx except  The test results show that your current treatment is OK, as the tests are stable, except the Vitamin D level is low, and the A1c is mildly high.  Please take Vitamin D 50000 units weekly for 12 weeks, then plan to change to OTC Vitamin D3 at 2000 units per day, indefinitely.  Also please increase the Lantus from 10 unit to 15 units per day.    Sheretta Grumbine to please inform pt, I will do rx x 2

## 2019-06-24 ENCOUNTER — Other Ambulatory Visit: Payer: Self-pay | Admitting: Internal Medicine

## 2019-06-24 NOTE — Telephone Encounter (Signed)
Done erx 

## 2019-07-01 ENCOUNTER — Other Ambulatory Visit: Payer: Self-pay | Admitting: Internal Medicine

## 2019-07-04 DIAGNOSIS — M1A9XX1 Chronic gout, unspecified, with tophus (tophi): Secondary | ICD-10-CM | POA: Diagnosis not present

## 2019-07-04 DIAGNOSIS — Z79899 Other long term (current) drug therapy: Secondary | ICD-10-CM | POA: Diagnosis not present

## 2019-07-04 DIAGNOSIS — M1A09X0 Idiopathic chronic gout, multiple sites, without tophus (tophi): Secondary | ICD-10-CM | POA: Diagnosis not present

## 2019-07-04 DIAGNOSIS — M79671 Pain in right foot: Secondary | ICD-10-CM | POA: Diagnosis not present

## 2019-07-04 DIAGNOSIS — N183 Chronic kidney disease, stage 3 (moderate): Secondary | ICD-10-CM | POA: Diagnosis not present

## 2019-07-08 ENCOUNTER — Other Ambulatory Visit: Payer: Self-pay

## 2019-07-08 ENCOUNTER — Other Ambulatory Visit: Payer: Medicare Other

## 2019-07-08 ENCOUNTER — Other Ambulatory Visit (INDEPENDENT_AMBULATORY_CARE_PROVIDER_SITE_OTHER): Payer: Medicare Other

## 2019-07-08 ENCOUNTER — Encounter: Payer: Self-pay | Admitting: Internal Medicine

## 2019-07-08 ENCOUNTER — Ambulatory Visit: Payer: Self-pay | Admitting: *Deleted

## 2019-07-08 ENCOUNTER — Ambulatory Visit (INDEPENDENT_AMBULATORY_CARE_PROVIDER_SITE_OTHER): Payer: Medicare Other | Admitting: Internal Medicine

## 2019-07-08 VITALS — BP 128/84 | HR 82 | Temp 98.2°F | Ht 61.0 in | Wt 238.0 lb

## 2019-07-08 DIAGNOSIS — G8929 Other chronic pain: Secondary | ICD-10-CM

## 2019-07-08 DIAGNOSIS — M25511 Pain in right shoulder: Secondary | ICD-10-CM | POA: Diagnosis not present

## 2019-07-08 DIAGNOSIS — M5412 Radiculopathy, cervical region: Secondary | ICD-10-CM | POA: Diagnosis not present

## 2019-07-08 DIAGNOSIS — I10 Essential (primary) hypertension: Secondary | ICD-10-CM

## 2019-07-08 DIAGNOSIS — R269 Unspecified abnormalities of gait and mobility: Secondary | ICD-10-CM

## 2019-07-08 DIAGNOSIS — R32 Unspecified urinary incontinence: Secondary | ICD-10-CM

## 2019-07-08 DIAGNOSIS — E118 Type 2 diabetes mellitus with unspecified complications: Secondary | ICD-10-CM

## 2019-07-08 LAB — URINALYSIS, ROUTINE W REFLEX MICROSCOPIC
Bilirubin Urine: NEGATIVE
Ketones, ur: NEGATIVE
Leukocytes,Ua: NEGATIVE
Nitrite: NEGATIVE
RBC / HPF: NONE SEEN (ref 0–?)
Specific Gravity, Urine: 1.02 (ref 1.000–1.030)
Total Protein, Urine: 100 — AB
Urine Glucose: NEGATIVE
Urobilinogen, UA: 0.2 (ref 0.0–1.0)
pH: 5.5 (ref 5.0–8.0)

## 2019-07-08 MED ORDER — GABAPENTIN 100 MG PO CAPS: 100.0000 mg | ORAL_CAPSULE | Freq: Three times a day (TID) | ORAL | 5 refills | Status: DC

## 2019-07-08 NOTE — Telephone Encounter (Signed)
Appointment scheduled.

## 2019-07-08 NOTE — Patient Instructions (Addendum)
Please take all new medication as prescribed - the gabapentin for pain  Please continue all other medications as before  Please have the pharmacy call with any other refills you may need.  Please continue your efforts at being more active, low cholesterol diet, and weight control.  Please keep your appointments with your specialists as you may have planned  You will be contacted regarding the referral for: MRI for the neck, orthopedic for the neck and shoulder, Urology for the incontinence, as well as [place], and [place] (which helps the home health)  Please go to the LAB in the Basement (turn left off the elevator) for the tests to be done today- just the urine testing today  You will be contacted by phone if any changes need to be made immediately.  Otherwise, you will receive a letter about your results with an explanation, but please check with MyChart first.  Please remember to sign up for MyChart if you have not done so, as this will be important to you in the future with finding out test results, communicating by private email, and scheduling acute appointments online when needed.  Please return in 6 months, or sooner if needed

## 2019-07-08 NOTE — Progress Notes (Signed)
Subjective:    Patient ID: [name], female    DOB: Apr 29, 1946, 73 y.o.   MRN: [id number]  HPI  Here with 4 days onset right side neck pain, mod but severe with movement, intermittent, better to sit still but worse to move the head or get up and walk around.  Also c/o bowel or bladder change except for nocturia requiring depends at night > 1 mo, fever, wt loss, or falls, though does also have pain to the right shoulder, cant lift overhead, and pain and numbness to the distal RUE, and maybe some grip strength loss, but also has similar to left hand grip as well.   Having to use the cane in the home since yesterday to avoid falling, when she normally only uses outside of the home. Denies urinary symptoms such as dysuria, frequency, urgency, flank pain, hematuria or n/v, fever, chills. On HD with persistent mild worsening feet and ankle swelling.  Duaghter upset that pt c/o pain at home and not at the doctors office.  Concerned that mom seems to sit too much.  Asking for [facility] with PT as well.  Tramadol works ok but tends to make her sleepy at times? Past Medical History:  Diagnosis Date  . Anemia, iron deficiency   . Aortic atherosclerosis ([place]) 11/17/2017  . Arthritis   . CAD (coronary artery disease)   . Carotid stenosis    Carotid US (02/2014):  Bilateral ICA 40-59%; > 50% L ECA; F/u 1 year  . Cataract    removed both eyes  . Cholelithiasis 11/17/2017  . Chronic kidney disease    chronic renal failure  . DM2 (diabetes mellitus, type 2) ([place])   . GERD (gastroesophageal reflux disease)   . Glaucoma   . HLD (hyperlipidemia)   . HTN (hypertension)   . Hx of cardiovascular stress test    Lexiscan Myoview (03/24/14):  No ischemia, EF 73%, Normal  . Hypoglycemia 06/27/2017  . Mitral regurgitation   . Osteopenia 07/21/2017  . Osteoporosis   . PUD (peptic ulcer disease)   . PVD (peripheral vascular disease) ([place])   . Shortness of breath    with exertion   Past Surgical History:   Procedure Laterality Date  . ABDOMINAL AORTOGRAM W/LOWER EXTREMITY N/A 08/13/2018   Procedure: ABDOMINAL AORTOGRAM W/LOWER EXTREMITY;  Surgeon: Waynetta Sandy, MD;  Location: [place] [facility];  Service: Cardiovascular;  Laterality: N/A;  . ABDOMINAL HYSTERECTOMY    . APPENDECTOMY    . AV FISTULA PLACEMENT Right 01/18/2013   Procedure: ARTERIOVENOUS (AV) FISTULA CREATION;  Surgeon: Rosetta Posner, MD;  Location: [place];  Service: Vascular;  Laterality: Right;  Ultrasound guided  . COLONOSCOPY    . CORONARY ANGIOPLASTY WITH STENT PLACEMENT    . PERIPHERAL VASCULAR INTERVENTION Bilateral 08/13/2018   Procedure: PERIPHERAL VASCULAR INTERVENTION;  Surgeon: Waynetta Sandy, MD;  Location: [place] [facility];  Service: Cardiovascular;  Laterality: Bilateral;  . RENAL ANGIOGRAM N/A 09/16/2011   Procedure: RENAL ANGIOGRAM;  Surgeon: Sherren Mocha, MD;  Location: [facility];  Service: Cardiovascular;  Laterality: N/A;    reports that she quit smoking about 11 years ago. She has never used smokeless tobacco. She reports that she does not drink alcohol or use drugs. family history includes Diabetes in her sister and son; Glaucoma in her father; Heart disease in her father. No Known Allergies Current Outpatient Medications on File Prior to Visit  Medication Sig Dispense Refill  . ACCU-CHEK AVIVA PLUS test  strip USE AS INSTRUCTED THREE TIMES DAILY. DX E11.9 100 each 12  . acetaminophen (TYLENOL) 500 MG tablet Take 1-2 tablets (500-1,000 mg total) by mouth every 8 (eight) hours as needed for mild pain.    Marland Kitchen allopurinol (ZYLOPRIM) 100 MG tablet Take 100 mg by mouth daily.    Marland Kitchen amLODipine (NORVASC) 10 MG tablet TAKE 1 TABLET BY MOUTH EVERY DAY 90 tablet 1  . aspirin 81 MG tablet Take 81 mg by mouth daily.    Marland Kitchen atorvastatin (LIPITOR) 80 MG tablet TAKE 1 TABLET (80 MG TOTAL) BY MOUTH DAILY AT 6 PM. 90 tablet 3  . BD PEN NEEDLE NANO U/F 32G X 4 MM MISC USE DAILY WITH LANTUS SOLASTAR 100  each 11  . blood glucose meter kit and supplies KIT Dispense based on patient and insurance preference. Use up to three times daily as directed. (FOR ICD-9 250.00, 250.01). 1 each 0  . calcitRIOL (ROCALTROL) 0.25 MCG capsule Take 0.25 mcg by mouth daily.    . calcitRIOL (ROCALTROL) 0.5 MCG capsule Take by mouth daily.    . clopidogrel (PLAVIX) 75 MG tablet TAKE 1 TABLET BY MOUTH EVERY DAY 90 tablet 1  . Cyanocobalamin (B-12 PO) Take 1 tablet by mouth daily.    . diclofenac sodium (VOLTAREN) 1 % GEL Apply 4 g topically 4 (four) times daily as needed. 400 g 11  . ferrous sulfate 325 (65 FE) MG tablet TAKE 1 TABLET BY MOUTH TWICE A DAY 180 tablet 1  . furosemide (LASIX) 80 MG tablet Take 0.5 tablets (40 mg total) by mouth 2 (two) times daily.    . hydrOXYzine (ATARAX/VISTARIL) 10 MG tablet Take 1 tablet (10 mg total) by mouth 3 (three) times daily as needed for itching. 30 tablet 0  . Insulin Glargine (LANTUS SOLOSTAR) 100 UNIT/ML Solostar Pen Inject 15 Units into the skin daily. 5 pen 11  . insulin lispro (HUMALOG KWIKPEN) 100 UNIT/ML KwikPen INEJCT 0-9 UNITS AS DIRECTED INTO SKIN 3 TIMES A DAY 15 mL 3  . Lancets MISC Use as directed three times daily with meals 100 each 3  . metoprolol tartrate (LOPRESSOR) 100 MG tablet TAKE 1 TABLET BY MOUTH TWICE A DAY 180 tablet 2  . nitroGLYCERIN (NITROSTAT) 0.4 MG SL tablet Place 1 tablet (0.4 mg total) under the tongue every 5 (five) minutes as needed for chest pain. Reported on 04/06/2016 25 tablet 5  . omeprazole (PRILOSEC) 20 MG capsule TAKE 1 CAPSULE BY MOUTH EVERY DAY 90 capsule 0  . polyethylene glycol powder (GLYCOLAX/MIRALAX) powder Take 17 gm by mouth daily 3350 g 11  . Potassium Chloride ER 20 MEQ TBCR Take 20 mEq by mouth daily.   6  . risedronate (ACTONEL) 150 MG tablet Take 1 tablet (150 mg total) by mouth every 30 (thirty) days. First Tuesday 3 tablet 3  . sodium bicarbonate 650 MG tablet Take 650 mg by mouth 2 (two) times daily.    .  Tetrahydrozoline HCl (VISINE OP) Place 2 drops into both eyes daily as needed (DRY EYE).    Marland Kitchen thiamine (VITAMIN B-1) 50 MG tablet Take 1 tablet (50 mg total) by mouth daily. 90 tablet 1  . timolol (TIMOPTIC) 0.5 % ophthalmic solution PLACE 1 DROP IN BOTH EYES DAILY    . tiZANidine (ZANAFLEX) 4 MG tablet TAKE 1 TABLET BY MOUTH EVERY 6 HOURS AS NEEDED FOR MUSCLE SPASMS 60 tablet 2  . traMADol (ULTRAM) 50 MG tablet TAKE 1 TABLET (50 MG TOTAL) BY MOUTH  EVERY 6 (SIX) HOURS AS NEEDED. 120 tablet 2  . Vitamin D, Ergocalciferol, (DRISDOL) 1.25 MG (50000 UT) CAPS capsule Take 1 capsule (50,000 Units total) by mouth every 7 (seven) days. 12 capsule 0  . zolpidem (AMBIEN) 5 MG tablet TAKE 1 TABLET (5 MG TOTAL) BY MOUTH AT BEDTIME AS NEEDED FOR SLEEP. 30 tablet 5   No current facility-administered medications on file prior to visit.    Review of Systems  Constitutional: Negative for other unusual diaphoresis or sweats HENT: Negative for ear discharge or swelling Eyes: Negative for other worsening visual disturbances Respiratory: Negative for stridor or other swelling  Gastrointestinal: Negative for worsening distension or other blood Genitourinary: Negative for retention or other urinary change Musculoskeletal: Negative for other MSK pain or swelling Skin: Negative for color change or other new lesions Neurological: Negative for worsening tremors and other numbness  Psychiatric/Behavioral: Negative for worsening agitation or other fatigue All other system neg per pt    Objective:   Physical Exam BP 128/84   Pulse 82   Temp 98.2 F (36.8 C) (Oral)   Ht '5\' 1"'$  (1.549 m)   Wt 238 lb (108 kg)   SpO2 95%   BMI 44.97 kg/m  VS noted,  Constitutional: Pt appears in NAD HENT: Head: NCAT.  Right Ear: External ear normal.  Left Ear: External ear normal.  Eyes: . Pupils are equal, round, and reactive to light. Conjunctivae and EOM are normal Nose: without d/c or deformity Neck: Neck supple. Gross  normal ROM Cardiovascular: Normal rate and regular rhythm.   Pulmonary/Chest: Effort normal and breath sounds without rales or wheezing.  Abd:  Soft, NT, ND, + BS, no organomegaly, no flank tender Right shoulder NT, but with marked decreased ROM to less than 90 degrees only; right neck with mild tender right lateral neck without rash or sweling Neurological: Pt is alert. At baseline orientation, motor grossly intact except the RUE 4+/5 Skin: Skin is warm. No rashes, other new lesions, no LE edema Psychiatric: Pt behavior is normal without agitation  No other exam findings Lab Results  Component Value Date   WBC 9.9 06/20/2019   HGB 11.8 (L) 06/20/2019   HCT 36.0 06/20/2019   PLT 367.0 06/20/2019   GLUCOSE 206 (H) 06/20/2019   CHOL 188 06/20/2019   TRIG 206.0 (H) 06/20/2019   HDL 41.80 06/20/2019   LDLDIRECT 101.0 06/20/2019   LDLCALC 81 06/14/2018   ALT 14 06/20/2019   AST 13 06/20/2019   NA 139 06/20/2019   K 4.0 06/20/2019   CL 101 06/20/2019   CREATININE 2.33 (H) 06/20/2019   BUN 39 (H) 06/20/2019   CO2 26 06/20/2019   TSH 3.34 06/20/2019   HGBA1C 8.1 (H) 06/20/2019   MICROALBUR 56.5 (H) 06/20/2019      Assessment & Plan:

## 2019-07-08 NOTE — Telephone Encounter (Signed)
Needs OV with me or Dr Tamala Julian in this office please

## 2019-07-08 NOTE — Telephone Encounter (Signed)
Pt reports woke up Friday with right sided neck pain. States back of neck on right side and along bottom.  Reports 10/10 with movement, turning head from side to side or down. States mild "stiffiness" if holding still. Reports took tramadol and Zanaflex few times, helps "But knocks me out." Also has been using heat, effective "But comes right back."  Reports "Painful to lift my right arm too far, and hurts my shoulder when I lift my arm."  Pt states this AM "Fingers of right hand feel a little numb."  Attempted to reach practice, recording of office hours. Care advise given  per protocol; advised to go to ED if pain increases in intensity, duration, radiates. Any increased numbness, CP, weakness. Pt verbalizes understanding. Does not have access to computer. Daughter has  Iphone.   Daughters # [phone] Please advise   Reason for Disposition . Numbness in an arm or hand (i.e., loss of sensation)  Answer Assessment - Initial Assessment Questions 1. ONSET: "When did the pain begin?"      Friday 2. LOCATION: "Where does it hurt?"      Right sided at  back and bottom of neck 3. PATTERN "Does the pain come and go, or has it been constant since it started?"      Constant with movement only 4. SEVERITY: "How bad is the pain?"  (Scale 1-10; or mild, moderate, severe)   - MILD (1-3): doesn't interfere with normal activities    - MODERATE (4-7): interferes with normal activities or awakens from sleep    - SEVERE (8-10):  excruciating pain, unable to do any normal activities      10/10 with movement, mild pain with rest 5. RADIATION: "Does the pain go anywhere else, shoot into your arms?"    Hurts to raise right arm in neck and shoulder 6. CORD SYMPTOMS: "Any weakness or numbness of the arms or legs?"     PAin to lift right arm and shoulder 7. CAUSE: "What do you think is causing the neck pain?"     MAybe slept funny 8. NECK OVERUSE: "Any recent activities that involved turning or twisting the  neck?"    no 9. OTHER SYMPTOMS: "Do you have any other symptoms?" (e.g., headache, fever, chest pain, difficulty breathing, neck swelling)  right hand fingers a little numb this AM  Protocols used: NECK PAIN OR STIFFNESS-A-AH

## 2019-07-09 ENCOUNTER — Telehealth: Payer: Self-pay | Admitting: Internal Medicine

## 2019-07-09 NOTE — Telephone Encounter (Signed)
Copied from [place] [id number]. Topic: General - Other >> Jul 09, 2019  3:33 PM Keene Breath wrote: Reason for CRM: [facility] call to request the previous lab results of patient be faxed over to them.  Fax # [phone], Phone# [phone]

## 2019-07-09 NOTE — Telephone Encounter (Signed)
results faxed

## 2019-07-10 ENCOUNTER — Encounter: Payer: Self-pay | Admitting: Family

## 2019-07-10 ENCOUNTER — Other Ambulatory Visit: Payer: Self-pay | Admitting: Internal Medicine

## 2019-07-10 ENCOUNTER — Ambulatory Visit: Payer: Medicare Other | Admitting: Family

## 2019-07-10 ENCOUNTER — Other Ambulatory Visit: Payer: Self-pay

## 2019-07-10 ENCOUNTER — Telehealth: Payer: Self-pay

## 2019-07-10 VITALS — BP 167/89 | HR 99 | Temp 97.9°F | Resp 18 | Ht 61.0 in | Wt 232.8 lb

## 2019-07-10 DIAGNOSIS — I779 Disorder of arteries and arterioles, unspecified: Secondary | ICD-10-CM | POA: Diagnosis not present

## 2019-07-10 DIAGNOSIS — N184 Chronic kidney disease, stage 4 (severe): Secondary | ICD-10-CM

## 2019-07-10 DIAGNOSIS — I872 Venous insufficiency (chronic) (peripheral): Secondary | ICD-10-CM

## 2019-07-10 LAB — URINE CULTURE
MICRO NUMBER:: 877507
SPECIMEN QUALITY:: ADEQUATE

## 2019-07-10 MED ORDER — CIPROFLOXACIN HCL 500 MG PO TABS: 500.0000 mg | ORAL_TABLET | Freq: Two times a day (BID) | ORAL | 0 refills | Status: AC

## 2019-07-10 NOTE — Telephone Encounter (Signed)
Pt has been informed of results and expressed understanding.  °

## 2019-07-10 NOTE — Telephone Encounter (Signed)
-----   Message from [name], MD sent at 07/10/2019 12:14 PM EDT ----- Left message on MyChart, pt to cont same tx except  The test results show that your current treatment is OK, except the urine culture is consistent with an infection.  I will send an antibiotic  Charbel Los to please inform pt, I will do rx

## 2019-07-10 NOTE — Progress Notes (Signed)
[facility] [place]   CC: Follow up peripheral artery occlusive disease  History of Present Illness [name] is a 73 y.o. female who isstatus post bilateral common iliac artery stentingon 08-13-18 by Dr. Jason Fila mixed arterial and venous disease with ulceration on the right lower extremity which has subsequentlyhealed.  At her visit in 10-05-18, her stentswere patent although there appearedto be stenosis of the right common iliac artery which Dr. Donzetta Matters thought wasresidual from previous as the angiogram did not demonstrate renal stenosis but pressure gradient was 50 mmHg down to 0 after stenting.  Dr. Donzetta Matters last evaluated pt on12-13-19. At that timeDr. Donzetta Matters advised pt tocontinue on aspirin andPlavix,follow-up in 3 to 4 months with repeat studies. Dr. Dwana Curd compression stockings bilaterally.   Shehas chronic kidney disease which is somewhat prohibitive to have further angiography. Serum creatinine and GFR result on file was 2.33 and 24.64 on 06-20-19, [facility]  She denies chest pain, denies feeling light headed, denies headache. She is somewhat dyspneic, but states not more short of breath than usual.  Pt denies any known hx of stroke or TIA. She states that the swelling in her lower legs resolves by morning with overnight elevation.She has not been wearing compression hose.   Diabetic:Yes, A1C was 8.1 on 06-20-19, improved from 10.7 A1C on 12-20-18 Tobacco WUJ:WJXBJY smoker, quitin 2009, started at age 60years   Pt meds include: Statin :Yes Betablocker:Yes ASA:Yes Other anticoagulants/antiplatelets:Plavix   Past Medical History:  Diagnosis Date  . Anemia, iron deficiency   . Aortic atherosclerosis ([place]) 11/17/2017  . Arthritis   . CAD (coronary artery disease)   . Carotid stenosis    Carotid US (02/2014):  Bilateral ICA 40-59%; > 50% L ECA; F/u 1 year  . Cataract    removed both eyes  .  Cholelithiasis 11/17/2017  . Chronic kidney disease    chronic renal failure  . DM2 (diabetes mellitus, type 2) ([place])   . GERD (gastroesophageal reflux disease)   . Glaucoma   . HLD (hyperlipidemia)   . HTN (hypertension)   . Hx of cardiovascular stress test    Lexiscan Myoview (03/24/14):  No ischemia, EF 73%, Normal  . Hypoglycemia 06/27/2017  . Mitral regurgitation   . Osteopenia 07/21/2017  . Osteoporosis   . PUD (peptic ulcer disease)   . PVD (peripheral vascular disease) ([place])   . Shortness of breath    with exertion    Social History Social History   Tobacco Use  . Smoking status: Former Smoker    Quit date: 10/25/2007    Years since quitting: 11.7  . Smokeless tobacco: Never Used  . Tobacco comment: Stopped 1.5 yrs ago January 2009  Substance Use Topics  . Alcohol use: No  . Drug use: No    Family History Family History  Problem Relation Age of Onset  . Heart disease Father   . Glaucoma Father   . Diabetes Sister   . Diabetes Son   . Colon cancer Neg Hx   . Colon polyps Neg Hx   . Esophageal cancer Neg Hx   . Rectal cancer Neg Hx   . Stomach cancer Neg Hx     Past Surgical History:  Procedure Laterality Date  . ABDOMINAL AORTOGRAM W/LOWER EXTREMITY N/A 08/13/2018   Procedure: ABDOMINAL AORTOGRAM W/LOWER EXTREMITY;  Surgeon: Waynetta Sandy, MD;  Location: [facility];  Service: Cardiovascular;  Laterality: N/A;  . ABDOMINAL HYSTERECTOMY    . APPENDECTOMY    .  AV FISTULA PLACEMENT Right 01/18/2013   Procedure: ARTERIOVENOUS (AV) FISTULA CREATION;  Surgeon: Rosetta Posner, MD;  Location: [facility];  Service: Vascular;  Laterality: Right;  Ultrasound guided  . COLONOSCOPY    . CORONARY ANGIOPLASTY WITH STENT PLACEMENT    . PERIPHERAL VASCULAR INTERVENTION Bilateral 08/13/2018   Procedure: PERIPHERAL VASCULAR INTERVENTION;  Surgeon: Waynetta Sandy, MD;  Location: [facility];  Service: Cardiovascular;  Laterality: Bilateral;  .  RENAL ANGIOGRAM N/A 09/16/2011   Procedure: RENAL ANGIOGRAM;  Surgeon: Sherren Mocha, MD;  Location[facility];  Service: Cardiovascular;  Laterality: N/A;    No Known Allergies  Current Outpatient Medications  Medication Sig Dispense Refill  . ACCU-CHEK AVIVA PLUS test strip USE AS INSTRUCTED THREE TIMES DAILY. DX E11.9 100 each 12  . acetaminophen (TYLENOL) 500 MG tablet Take 1-2 tablets (500-1,000 mg total) by mouth every 8 (eight) hours as needed for mild pain.    Marland Kitchen allopurinol (ZYLOPRIM) 100 MG tablet Take 100 mg by mouth daily.    Marland Kitchen amLODipine (NORVASC) 10 MG tablet TAKE 1 TABLET BY MOUTH EVERY DAY 90 tablet 1  . aspirin 81 MG tablet Take 81 mg by mouth daily.    Marland Kitchen atorvastatin (LIPITOR) 80 MG tablet TAKE 1 TABLET (80 MG TOTAL) BY MOUTH DAILY AT 6 PM. 90 tablet 3  . BD PEN NEEDLE NANO U/F 32G X 4 MM MISC USE DAILY WITH LANTUS SOLASTAR 100 each 11  . blood glucose meter kit and supplies KIT Dispense based on patient and insurance preference. Use up to three times daily as directed. (FOR ICD-9 250.00, 250.01). 1 each 0  . calcitRIOL (ROCALTROL) 0.25 MCG capsule Take 0.25 mcg by mouth daily.    . calcitRIOL (ROCALTROL) 0.5 MCG capsule Take by mouth daily.    . clopidogrel (PLAVIX) 75 MG tablet TAKE 1 TABLET BY MOUTH EVERY DAY 90 tablet 1  . Cyanocobalamin (B-12 PO) Take 1 tablet by mouth daily.    . diclofenac sodium (VOLTAREN) 1 % GEL Apply 4 g topically 4 (four) times daily as needed. 400 g 11  . ferrous sulfate 325 (65 FE) MG tablet TAKE 1 TABLET BY MOUTH TWICE A DAY 180 tablet 1  . furosemide (LASIX) 80 MG tablet Take 0.5 tablets (40 mg total) by mouth 2 (two) times daily.    Marland Kitchen gabapentin (NEURONTIN) 100 MG capsule Take 1 capsule (100 mg total) by mouth 3 (three) times daily. 90 capsule 5  . hydrOXYzine (ATARAX/VISTARIL) 10 MG tablet Take 1 tablet (10 mg total) by mouth 3 (three) times daily as needed for itching. 30 tablet 0  . Insulin Glargine (LANTUS SOLOSTAR) 100 UNIT/ML  Solostar Pen Inject 15 Units into the skin daily. 5 pen 11  . insulin lispro (HUMALOG KWIKPEN) 100 UNIT/ML KwikPen INEJCT 0-9 UNITS AS DIRECTED INTO SKIN 3 TIMES A DAY 15 mL 3  . Lancets MISC Use as directed three times daily with meals 100 each 3  . metoprolol tartrate (LOPRESSOR) 100 MG tablet TAKE 1 TABLET BY MOUTH TWICE A DAY 180 tablet 2  . nitroGLYCERIN (NITROSTAT) 0.4 MG SL tablet Place 1 tablet (0.4 mg total) under the tongue every 5 (five) minutes as needed for chest pain. Reported on 04/06/2016 25 tablet 5  . omeprazole (PRILOSEC) 20 MG capsule TAKE 1 CAPSULE BY MOUTH EVERY DAY 90 capsule 0  . polyethylene glycol powder (GLYCOLAX/MIRALAX) powder Take 17 gm by mouth daily 3350 g 11  . Potassium Chloride ER 20 MEQ  TBCR Take 20 mEq by mouth daily.   6  . risedronate (ACTONEL) 150 MG tablet Take 1 tablet (150 mg total) by mouth every 30 (thirty) days. First Tuesday 3 tablet 3  . sodium bicarbonate 650 MG tablet Take 650 mg by mouth 2 (two) times daily.    . Tetrahydrozoline HCl (VISINE OP) Place 2 drops into both eyes daily as needed (DRY EYE).    Marland Kitchen thiamine (VITAMIN B-1) 50 MG tablet Take 1 tablet (50 mg total) by mouth daily. 90 tablet 1  . timolol (TIMOPTIC) 0.5 % ophthalmic solution PLACE 1 DROP IN BOTH EYES DAILY    . tiZANidine (ZANAFLEX) 4 MG tablet TAKE 1 TABLET BY MOUTH EVERY 6 HOURS AS NEEDED FOR MUSCLE SPASMS 60 tablet 2  . traMADol (ULTRAM) 50 MG tablet TAKE 1 TABLET (50 MG TOTAL) BY MOUTH EVERY 6 (SIX) HOURS AS NEEDED. 120 tablet 2  . Vitamin D, Ergocalciferol, (DRISDOL) 1.25 MG (50000 UT) CAPS capsule Take 1 capsule (50,000 Units total) by mouth every 7 (seven) days. 12 capsule 0  . zolpidem (AMBIEN) 5 MG tablet TAKE 1 TABLET (5 MG TOTAL) BY MOUTH AT BEDTIME AS NEEDED FOR SLEEP. 30 tablet 5   No current facility-administered medications for this visit.     ROS: See HPI for pertinent positives and negatives.   Physical Examination  Vitals:   07/10/19 1000  BP: (!) 167/89   Pulse: 99  Resp: 18  Temp: 97.9 F (36.6 C)  TempSrc: Temporal  SpO2: 97%  Weight: 232 lb 12.8 oz (105.6 kg)  Height: '5\' 1"'$  (1.549 m)   Body mass index is 43.99 kg/m.  General: A&O x 3, WDWN, morbidly obese female. Accompanied by her grand daughter  Gait: slow, steady, using cane HENT: No gross abnormalities.  Eyes: PERRLA. Pulmonary: Respirations are moderately labored at rest, fair air movement in all fields, no rales, rhonchi, or wheezes Cardiac: regular rhythm, no detected murmur.         Carotid Bruits Right Left   Negative Negative   Radial pulses are 1+ palpable right, 2+ palpable left  Adominal aortic pulse is not palpable                         VASCULAR EXAM: Extremities without ischemic changes, without Gangrene; without open wounds. 1+ non pitting bilateral pretibial edema                                                                                                           LE Pulses Right Left       FEMORAL  not palpable, morbidly obese, large panus  not palpable        POPLITEAL  not palpable   not palpable       POSTERIOR TIBIAL  not palpable   not palpable        DORSALIS PEDIS      ANTERIOR TIBIAL not palpable  not palpable    Abdomen: soft, NT, no palpable masses. Skin: no rashes, no cellulitis,  no ulcers noted. Musculoskeletal: no muscle wasting or atrophy.  Neurologic: A&O X 3; appropriate affect, Sensation is normal; MOTOR FUNCTION:  moving all extremities equally, motor strength 4/5 throughout. Speech is fluent/normal. CN 2-12 intact. Psychiatric: Thought content is normal, mood appropriate for clinical situation.    DATA  Carotid Duplex (04-08-19): Right Carotid Findings: +----------+--------+--------+--------+------------+--------+  PSV cm/sEDV cm/sStenosisDescribe Comments +----------+--------+--------+--------+------------+--------+ CCA Prox 80 9      +----------+--------+--------+--------+------------+--------+ CCA Mid 63 12     +----------+--------+--------+--------+------------+--------+ CCA Distal46 9     +----------+--------+--------+--------+------------+--------+ ICA Prox 115 21 1-39% heterogenous  +----------+--------+--------+--------+------------+--------+ ICA Mid 97 25     +----------+--------+--------+--------+------------+--------+ ICA Distal55 19     +----------+--------+--------+--------+------------+--------+ ECA 42 6     +----------+--------+--------+--------+------------+--------+  +----------+--------+-------+----------------+-------------------+  PSV cm/sEDV cmsDescribe Arm Pressure (mmHG) +----------+--------+-------+----------------+-------------------+ [id number] 1 Multiphasic, WNL  +----------+--------+-------+----------------+-------------------+  +---------+--------+--+--------+-+----------------------------+ VertebralPSV cm/s28EDV cm/s7Atypical and Bi- directional +---------+--------+--+--------+-+----------------------------+ Right Brachial 81 cm/s triphasic waveform. Left Brachial waveform 77 cm/s triphasic waveforms.  Left Carotid Findings: +----------+--------+--------+--------+-------------------------+--------+  PSV cm/sEDV cm/sStenosisDescribe Comments +----------+--------+--------+--------+-------------------------+--------+ CCA Prox 67 10     +----------+--------+--------+--------+-------------------------+--------+ CCA Mid 72 12      +----------+--------+--------+--------+-------------------------+--------+ CCA Distal86 16  heterogenous   +----------+--------+--------+--------+-------------------------+--------+ ICA Prox 128 30 1-39% heterogenous and calcific  +----------+--------+--------+--------+-------------------------+--------+ ICA Mid 97 23     +----------+--------+--------+--------+-------------------------+--------+ ICA Distal67 17     +----------+--------+--------+--------+-------------------------+--------+ ECA 183 2  heterogenous   +----------+--------+--------+--------+-------------------------+--------+  +----------+--------+--------+----------------+-------------------+ SubclavianPSV cm/sEDV cm/sDescribe Arm Pressure (mmHG) +----------+--------+--------+----------------+-------------------+  138 4 Multiphasic, WNL  +----------+--------+--------+----------------+-------------------+  +---------+--------+--+--------+--+---------+ VertebralPSV cm/s48EDV cm/s11Antegrade +---------+--------+--+--------+--+---------+  Summary: Right Carotid: Velocities in the right ICA are consistent with a 1-39% stenosis, upper end of range. Atypical vertebral artery waveform suggesting more proximal disease. Left Carotid: Velocities in the left ICA are consistent with a 1-39% stenosis, probable upper end of range. Calcific plaque may obscure higher velocity.    Aortoiliac Duplex (04-08-19): Abdominal Aorta Findings: +-------------+-------+----------+----------+----------+--------+--------+ Location AP (cm)Trans (cm)PSV (cm/s)Waveform  ThrombusComments +-------------+-------+----------+----------+----------+--------+--------+ RT EIA Distal212.0   monophasic brisk  +-------------+-------+----------+----------+----------+--------+--------+ LT EIA Prox 166.0   monophasic   +-------------+-------+----------+----------+----------+--------+--------+ LT EIA Mid 199.0   monophasic brisk  +-------------+-------+----------+----------+----------+--------+--------+ LT EIA Distal215.0   monophasic brisk  +-------------+-------+----------+----------+----------+--------+--------+   Right Stent(s): +--------------+---+++--------+ Prox to Stent 222stenotic +--------------+---+++--------+ Proximal Stent227stenotic +--------------+---+++--------+ Right CFA 114 cm/s broad biphasic waveform   Left Stent(s): +--------------+---++----------+--------+ Prox to Stent 231   +--------------+---++----------+--------+ Proximal Stent20moophasicstenotic +--------------+---++----------+--------+ Distal Stent 228mophasicbrisk  +--------------+---++----------+--------+ Left CFA 85 cm/s Brisk monophasic waveform  Summary: IVC/Iliac: Suboptimal exam, further testing may be warranted. Probable > 50% common iliac artery stenosis. Stent walls not visualized.    ABI (Date: 04/08/2019): ABI Findings: +---------+------------------+-----+--------+--------+ Right Rt Pressure (mmHg)IndexWaveformComment  +---------+------------------+-----+--------+--------+ Brachial 216     +---------+------------------+-----+--------+--------+ PTA 254 1.18 biphasic  +---------+------------------+-----+--------+--------+ DP 139 0.64 biphasic   +---------+------------------+-----+--------+--------+ Great Toe86 0.40 Abnormal  +---------+------------------+-----+--------+--------+  +---------+------------------+-----+--------+-------+ Left Lt Pressure (mmHg)IndexWaveformComment +---------+------------------+-----+--------+-------+ Brachial 203     +---------+------------------+-----+--------+-------+ PTA 119 0.55 biphasic  +---------+------------------+-----+--------+-------+ DP 131 0.61 biphasic  +---------+------------------+-----+--------+-------+ Great Toe77 0.36 Abnormal  +---------+------------------+-----+--------+-------+  +-------+-----------+-----------+------------+------------+ ABI/TBIToday's ABIToday's TBIPrevious ABIPrevious TBI +-------+-----------+-----------+------------+------------+ Right [place] 0.40 [place] 0.33  +-------+-----------+-----------+------------+------------+ Left 0.61 0.36 0.58 0.40  +-------+-----------+-----------+------------+------------  Bilateral ABIs and TBIs appear essentially unchanged.  Summary: Right: Resting right ankle-brachial index indicates noncompressible right lower extremity arteries.The right toe-brachial index is abnormal. Left: Resting left ankle-brachial index indicates moderate left lower extremity arterial disease. The left toe-brachial index is abnormal.   ASSESSMENT: Ashley MANSs a [age].o. female who isstatus post bilateral common iliac artery stentingon 08-13-18 by Dr. CaJason Filaixed arterial and venous disease with  ulceration on the right lower extremity which has subsequentlyhealed.  There are no signs of ischemia in her feet or legs. She walks with a cane, her walking seems limited by her multiple  medical problems, dyspnea, morbid obesity, and no claudication.She does not seem to walk enough to elicit claudication.   Bilateral 1+ pretibial edema: pt states the swelling in her lower legs resolves by morning with overnight elevation of her legs. She is not wearing compression hose. We measured her lower legs today for knee high compression hose, gave her the measurements, and gave her and her grand daughter the information re [employer] ([place]) to obtain discount knee high compression hose.  Her grand daughter states that there is a family member that live with pt that can help her donn the compression hose in the AM, and remove them at bedtime.  We also discussed elevation of her legs to minimize dependent edema, see Patent Instructions   She has not had any testing of her peripheral artery disease since the June 2020 exam.  She will need to return for this.   Bilateral aortoiliac duplex on 04-08-19 was a suboptimal exam.. Probable > 50% common iliac artery stenosis. Stent walls not visualized. Brisk monophasic signals bilaterally.  ABI's in June 2020 were unchanged from 01-04-19: biphasic waveforms bilaterally, non compressible vessels on the right, moderate disease in the left.   She has no hx of stroke or TIA: 04-08-19 carotid duplex showed 1-39% ICA stenosis; less bilateral ICA stenosis than noted on the carotid duplex on 04-05-16. No need to recheck this until about 2 years unless she becomes symptomatic.   Her atherosclerotic risk factors include uncontrolled but improvinng DM, CKD, morbid obesity, former smoker, and CAD.  I advised pt to continue to work closely with her PCP to get her blood sugar under as good control as possible.  She is under the care of a nephrologist.   PLAN:  Based on the patient's vascular studies and examination, pt will return to clinic in 2-4 weeks with bilateral aortoiliac duplex and ABI's.   We discussed walking more as long as she is steady on  her feet. Daily seated leg exercises discussed and demonstrated.    I discussed in depth with the patient the nature of atherosclerosis, and emphasized the importance of maximal medical management including strict control of blood pressure, blood glucose, and lipid levels, obtaining regular exercise, and continued cessation of smoking.  The patient is aware that without maximal medical management the underlying atherosclerotic disease process will progress, limiting the benefit of any interventions.  The patient was given information about PAD including signs, symptoms, treatment, what symptoms should prompt the patient to seek immediate medical care, and risk reduction measures to take.  Clemon Chambers, RN, MSN, FNP-C [facility] Phone: [phone]  Clinic MD: Laqueta Due  07/10/19 10:09 AM

## 2019-07-10 NOTE — Patient Instructions (Addendum)
Before your next abdominal ultrasound:  Avoid gas forming foods and beverages the day before the test.   Take two Extra-Strength Gas-X capsules at bedtime the night before the test. Take another two Extra-Strength Gas-X capsules in the middle of the night if you get up to the restroom, if not, first thing in the morning with water.  Do not chew gum.      To decrease swelling in your feet and legs: Elevate feet above slightly bent knees, feet above heart, overnight and 3-4 times per day for 20 minutes.     Peripheral Vascular Disease  Peripheral vascular disease (PVD) is a disease of the blood vessels that are not part of your heart and brain. A simple term for PVD is poor circulation. In most cases, PVD narrows the blood vessels that carry blood from your heart to the rest of your body. This can reduce the supply of blood to your arms, legs, and internal organs, like your stomach or kidneys. However, PVD most often affects a person's lower legs and feet. Without treatment, PVD tends to get worse. PVD can also lead to acute ischemic limb. This is when an arm or leg suddenly cannot get enough blood. This is a medical emergency. Follow these instructions at home: Lifestyle  Do not use any products that contain nicotine or tobacco, such as cigarettes and e-cigarettes. If you need help quitting, ask your doctor.  Lose weight if you are overweight. Or, stay at a healthy weight as told by your doctor.  Eat a diet that is low in fat and cholesterol. If you need help, ask your doctor.  Exercise regularly. Ask your doctor for activities that are right for you. General instructions  Take over-the-counter and prescription medicines only as told by your doctor.  Take good care of your feet: ? Wear comfortable shoes that fit well. ? Check your feet often for any cuts or sores.  Keep all follow-up visits as told by your doctor This is important. Contact a doctor if:  You have cramps in your  legs when you walk.  You have leg pain when you are at rest.  You have coldness in a leg or foot.  Your skin changes.  You are unable to get or have an erection (erectile dysfunction).  You have cuts or sores on your feet that do not heal. Get help right away if:  Your arm or leg turns cold, numb, and blue.  Your arms or legs become red, warm, swollen, painful, or numb.  You have chest pain.  You have trouble breathing.  You suddenly have weakness in your face, arm, or leg.  You become very confused or you cannot speak.  You suddenly have a very bad headache.  You suddenly cannot see. Summary  Peripheral vascular disease (PVD) is a disease of the blood vessels.  A simple term for PVD is poor circulation. Without treatment, PVD tends to get worse.  Treatment may include exercise, low fat and low cholesterol diet, and quitting smoking. This information is not intended to replace advice given to you by your health care provider. Make sure you discuss any questions you have with your health care provider. Document Released: 01/04/2010 Document Revised: 09/22/2017 Document Reviewed: 11/17/2016 Elsevier Patient Education  2020 [employer].

## 2019-07-11 ENCOUNTER — Other Ambulatory Visit: Payer: Self-pay | Admitting: *Deleted

## 2019-07-11 NOTE — Patient Outreach (Signed)
[facility]) Care Management  07/11/2019  [name] 09-03-1946 [id number]    Referral received: 07/08/2019 Initial Outreach: 07/11/2019  Telephone Assessment-Enrolled (Diabetes)  RN spoke with pt today along with permission to speak to her daughter Gailen Shelter and granddaughter Makala). RN introduced the [facility] services and available assistance. Discussed social workers, pharmacy and community resources that maybe available to further assist pt with her needs. Pt lives with her daughter [name] both the pt and family were receptive the [facility] services. RN further discussed any related to pt's medical problems that she feels can be better managed. Diabetes was the topic with the last A1C 8.1 on 8/27 previously 10.1 6 months ago. RN stressed the importance on how diabetes can affects the body and lead to other acute medical problems (verbalized an understanding). Other issues reported by the pt's family was a recent vascular visit to the her provider's office on yesterday as pt was informed to elevated her LE and wear her compression stockings to again prevent issues from occurring later with bilateral swelling. RN educated pt and family on the importance on why pt should elevate and wear her compression stockings to prevent ongoing swelling. Based upon what the granddaughter reports pt maybe 3-4+ on her measurements and not wearing her current compression stocking that pt reports are to tight. Again RN encouraged pt to elevate her legs in the recliner or bed to reduce/prevent ongoing swelling. Also provided resources for obtaining compression stockings from any medical supply store, [facility], [employer] in [place] which is a warehouse that can properly fit the pt for different compression levels of comfortable on compression stockings. Stats they will visit soon.  RN discussed enrolling into the [facility] program and services for diabetes management related reducing her  A1C through healthy eating habits and education on her ongoing edema. Generated a plan of care however pt request a call back to completed the initial assessment over the next two weeks. RN scheduled a date and time. Will also make a referral for social worker for transportation assistance and notify pt's provider on her disposition with [facility] services. Both daughter and granddaughter grateful for the call today and appreciates the call.  THN CM Care Plan Problem One     Most Recent Value  Care Plan Problem One  Knowledge Deficit of diabetes control related to A1c-8.1%  Role Documenting the Problem One  Care Management [place] for Problem One  Active  THN Long Term Goal   Pt will report a reduction in A1c 1-2 points in the next 90 days.  THN Long Term Goal Start Date  07/11/19  Interventions for Problem One Long Term Goal  Will discuss the importance of A1c and send pt printed material conerning increase knowledge base on how to increase her A1c. Will discussed daily BS and how dietary modificatins and exercise improves her overall diabetes and readings.  THN CM Short Term Goal #1   Pt will be able to recognize a healthy food items related to modification to create an appropriate diabetic diet,  THN CM Short Term Goal #1 Start Date  07/11/19  Interventions for Short Term Goal #1  Will discuss dietary measures and better eating habits with simple carbs, fats  and proteins. Will also send  THN CM Short Term Goal #2   Bilateral swelling to LE will reduce within the next 30 days.  THN CM Short Term Goal #2 Start Date  07/11/19  Interventions  for Short Term Goal #2  Will educate on elevating her LE and wearingher compression stockings as recommended by her vascular provider. Stress the importance of reducing her swelling to prevent acute events from occurring.      Raina Mina, RN Care Management Coordinator [place] Office [phone]

## 2019-07-12 ENCOUNTER — Other Ambulatory Visit: Payer: Self-pay

## 2019-07-12 ENCOUNTER — Ambulatory Visit: Payer: Medicare Other | Admitting: Podiatry

## 2019-07-12 ENCOUNTER — Ambulatory Visit (HOSPITAL_COMMUNITY)
Admission: RE | Admit: 2019-07-12 | Discharge: 2019-07-12 | Disposition: A | Discharge status: Home / Self Care | Payer: Medicare Other | Source: Ambulatory Visit | Attending: Nephrology | Admitting: Nephrology

## 2019-07-12 ENCOUNTER — Encounter: Payer: Self-pay | Admitting: Podiatry

## 2019-07-12 VITALS — BP 144/61 | HR 99 | Temp 97.8°F | Resp 18

## 2019-07-12 DIAGNOSIS — E1151 Type 2 diabetes mellitus with diabetic peripheral angiopathy without gangrene: Secondary | ICD-10-CM | POA: Diagnosis not present

## 2019-07-12 DIAGNOSIS — D539 Nutritional anemia, unspecified: Secondary | ICD-10-CM | POA: Diagnosis not present

## 2019-07-12 DIAGNOSIS — B351 Tinea unguium: Secondary | ICD-10-CM | POA: Diagnosis not present

## 2019-07-12 LAB — POCT HEMOGLOBIN-HEMACUE: Hemoglobin: 11.1 g/dL — ABNORMAL LOW (ref 12.0–15.0)

## 2019-07-12 MED ORDER — EPOETIN ALFA 10000 UNIT/ML IJ SOLN
10000.0000 [IU] | INTRAMUSCULAR | Status: DC
  Administered 2019-07-12: 10000 [IU] via SUBCUTANEOUS

## 2019-07-12 MED ORDER — EPOETIN ALFA 10000 UNIT/ML IJ SOLN
INTRAMUSCULAR | Status: AC
  Filled 2019-07-12: qty 1

## 2019-07-12 NOTE — Patient Outreach (Signed)
[place] [facility]) Care Management  07/12/2019  [name] Jan 24, 1946 [id number]   Social work referral received from [facility], Raina Mina to assist patient with transportation resources.  Successful outreach to patient today.  Patient stated that her daughter and/or granddaughter are usually able to transport her to MD appointments but she would like a back up option if needed. Patient was not aware that she may be eligible for transportation through [facility].  Encouraged her to contact customer service to verify. Discussed SCAT services but patient declined application being submitted at this time. She would like to contact [facility] first.   Will follow up with her next week and complete/submit SCAT application if she decides she would like to apply.  Ronn Melena, BSW Social Worker [phone]

## 2019-07-12 NOTE — Patient Instructions (Signed)
Diabetes Mellitus and Foot Care Foot care is an important part of your health, especially when you have diabetes. Diabetes may cause you to have problems because of poor blood flow (circulation) to your feet and legs, which can cause your skin to:  Become thinner and drier.  Break more easily.  Heal more slowly.  Peel and crack. You may also have nerve damage (neuropathy) in your legs and feet, causing decreased feeling in them. This means that you may not notice minor injuries to your feet that could lead to more serious problems. Noticing and addressing any potential problems early is the best way to prevent future foot problems. How to care for your feet Foot hygiene  Wash your feet daily with warm water and mild soap. Do not use hot water. Then, pat your feet and the areas between your toes until they are completely dry. Do not soak your feet as this can dry your skin.  Trim your toenails straight across. Do not dig under them or around the cuticle. File the edges of your nails with an emery board or nail file.  Apply a moisturizing lotion or petroleum jelly to the skin on your feet and to dry, brittle toenails. Use lotion that does not contain alcohol and is unscented. Do not apply lotion between your toes. Shoes and socks  Wear clean socks or stockings every day. Make sure they are not too tight. Do not wear knee-high stockings since they may decrease blood flow to your legs.  Wear shoes that fit properly and have enough cushioning. Always look in your shoes before you put them on to be sure there are no objects inside.  To break in new shoes, wear them for just a few hours a day. This prevents injuries on your feet. Wounds, scrapes, corns, and calluses  Check your feet daily for blisters, cuts, bruises, sores, and redness. If you cannot see the bottom of your feet, use a mirror or ask someone for help.  Do not cut corns or calluses or try to remove them with medicine.  If you  find a minor scrape, cut, or break in the skin on your feet, keep it and the skin around it clean and dry. You may clean these areas with mild soap and water. Do not clean the area with peroxide, alcohol, or iodine.  If you have a wound, scrape, corn, or callus on your foot, look at it several times a day to make sure it is healing and not infected. Check for: ? Redness, swelling, or pain. ? Fluid or blood. ? Warmth. ? Pus or a bad smell. General instructions  Do not cross your legs. This may decrease blood flow to your feet.  Do not use heating pads or hot water bottles on your feet. They may burn your skin. If you have lost feeling in your feet or legs, you may not know this is happening until it is too late.  Protect your feet from hot and cold by wearing shoes, such as at the beach or on hot pavement.  Schedule a complete foot exam at least once a year (annually) or more often if you have foot problems. If you have foot problems, report any cuts, sores, or bruises to your health care provider immediately. Contact a health care provider if:  You have a medical condition that increases your risk of infection and you have any cuts, sores, or bruises on your feet.  You have an injury that is not   healing.  You have redness on your legs or feet.  You feel burning or tingling in your legs or feet.  You have pain or cramps in your legs and feet.  Your legs or feet are numb.  Your feet always feel cold.  You have pain around a toenail. Get help right away if:  You have a wound, scrape, corn, or callus on your foot and: ? You have pain, swelling, or redness that gets worse. ? You have fluid or blood coming from the wound, scrape, corn, or callus. ? Your wound, scrape, corn, or callus feels warm to the touch. ? You have pus or a bad smell coming from the wound, scrape, corn, or callus. ? You have a fever. ? You have a red line going up your leg. Summary  Check your feet every day  for cuts, sores, red spots, swelling, and blisters.  Moisturize feet and legs daily.  Wear shoes that fit properly and have enough cushioning.  If you have foot problems, report any cuts, sores, or bruises to your health care provider immediately.  Schedule a complete foot exam at least once a year (annually) or more often if you have foot problems. This information is not intended to replace advice given to you by your health care provider. Make sure you discuss any questions you have with your health care provider. Document Released: 10/07/2000 Document Revised: 11/22/2017 Document Reviewed: 11/11/2016 Elsevier Patient Education  2020 Elsevier Inc.   Onychomycosis/Fungal Toenails  WHAT IS IT? An infection that lies within the keratin of your nail plate that is caused by a fungus.  WHY ME? Fungal infections affect all ages, sexes, races, and creeds.  There may be many factors that predispose you to a fungal infection such as age, coexisting medical conditions such as diabetes, or an autoimmune disease; stress, medications, fatigue, genetics, etc.  Bottom line: fungus thrives in a warm, moist environment and your shoes offer such a location.  IS IT CONTAGIOUS? Theoretically, yes.  You do not want to share shoes, nail clippers or files with someone who has fungal toenails.  Walking around barefoot in the same room or sleeping in the same bed is unlikely to transfer the organism.  It is important to realize, however, that fungus can spread easily from one nail to the next on the same foot.  HOW DO WE TREAT THIS?  There are several ways to treat this condition.  Treatment may depend on many factors such as age, medications, pregnancy, liver and kidney conditions, etc.  It is best to ask your doctor which options are available to you.  1. No treatment.   Unlike many other medical concerns, you can live with this condition.  However for many people this can be a painful condition and may lead to  ingrown toenails or a bacterial infection.  It is recommended that you keep the nails cut short to help reduce the amount of fungal nail. 2. Topical treatment.  These range from herbal remedies to prescription strength nail lacquers.  About 40-50% effective, topicals require twice daily application for approximately 9 to 12 months or until an entirely new nail has grown out.  The most effective topicals are medical grade medications available through physicians offices. 3. Oral antifungal medications.  With an 80-90% cure rate, the most common oral medication requires 3 to 4 months of therapy and stays in your system for a year as the new nail grows out.  Oral antifungal medications do require   blood work to make sure it is a safe drug for you.  A liver function panel will be performed prior to starting the medication and after the first month of treatment.  It is important to have the blood work performed to avoid any harmful side effects.  In general, this medication safe but blood work is required. 4. Laser Therapy.  This treatment is performed by applying a specialized laser to the affected nail plate.  This therapy is noninvasive, fast, and non-painful.  It is not covered by insurance and is therefore, out of pocket.  The results have been very good with a 80-95% cure rate.  The [facility] is the only practice in the area to offer this therapy. 5. Permanent Nail Avulsion.  Removing the entire nail so that a new nail will not grow back. 

## 2019-07-14 ENCOUNTER — Encounter: Payer: Self-pay | Admitting: Internal Medicine

## 2019-07-14 NOTE — Assessment & Plan Note (Addendum)
For MRI c spine, gabapentin asd, and refer orthopedic  Note:  Total time for pt hx, exam, review of record with pt in the room, determination of diagnoses and plan for further eval and tx is > 40 min, with over 50% spent in coordination and counseling of patient including the differential dx, tx, further evaluation and other management of right cervical radiculopathy, right shoulder pain, urinary incontinence, gait d/o, HTN, DM

## 2019-07-14 NOTE — Assessment & Plan Note (Signed)
Etiology unclear, ? rotater cuff dz, for orthopedic referral

## 2019-07-14 NOTE — Assessment & Plan Note (Signed)
For ua and cx, also refer urology

## 2019-07-14 NOTE — Assessment & Plan Note (Signed)
stable overall by history and exam, recent data reviewed with pt, and pt to continue medical treatment as before,  to f/u any worsening symptoms or concerns  

## 2019-07-14 NOTE — Assessment & Plan Note (Signed)
For [facility] with RN, PT, also THN

## 2019-07-15 ENCOUNTER — Other Ambulatory Visit: Payer: Self-pay | Admitting: Internal Medicine

## 2019-07-18 NOTE — Progress Notes (Signed)
Subjective: [name] is seen today with h/o PAD for follow up painful, elongated, thickened toenails 2-5 b/l feet that she cannot cut. Pain interferes with daily activities. Aggravating factor includes wearing enclosed shoe gear and relieved with periodic debridement.   She is followed closely by Vascular.  Current Outpatient Medications on File Prior to Visit  Medication Sig  . ACCU-CHEK AVIVA PLUS test strip USE AS INSTRUCTED THREE TIMES DAILY. DX E11.9  . acetaminophen (TYLENOL) 500 MG tablet Take 1-2 tablets (500-1,000 mg total) by mouth every 8 (eight) hours as needed for mild pain.  Marland Kitchen allopurinol (ZYLOPRIM) 100 MG tablet Take 100 mg by mouth daily.  Marland Kitchen amLODipine (NORVASC) 10 MG tablet TAKE 1 TABLET BY MOUTH EVERY DAY  . aspirin 81 MG tablet Take 81 mg by mouth daily.  Marland Kitchen atorvastatin (LIPITOR) 80 MG tablet TAKE 1 TABLET (80 MG TOTAL) BY MOUTH DAILY AT 6 PM.  . BD PEN NEEDLE NANO U/F 32G X 4 MM MISC USE DAILY WITH LANTUS SOLASTAR  . blood glucose meter kit and supplies KIT Dispense based on patient and insurance preference. Use up to three times daily as directed. (FOR ICD-9 250.00, 250.01).  . calcitRIOL (ROCALTROL) 0.25 MCG capsule Take 0.25 mcg by mouth daily.  . calcitRIOL (ROCALTROL) 0.5 MCG capsule Take by mouth daily.  . ciprofloxacin (CIPRO) 500 MG tablet Take 1 tablet (500 mg total) by mouth 2 (two) times daily for 10 days.  . clopidogrel (PLAVIX) 75 MG tablet TAKE 1 TABLET BY MOUTH EVERY DAY  . Cyanocobalamin (B-12 PO) Take 1 tablet by mouth daily.  . diclofenac sodium (VOLTAREN) 1 % GEL Apply 4 g topically 4 (four) times daily as needed.  . ferrous sulfate 325 (65 FE) MG tablet TAKE 1 TABLET BY MOUTH TWICE A DAY  . furosemide (LASIX) 80 MG tablet Take 0.5 tablets (40 mg total) by mouth 2 (two) times daily.  Marland Kitchen gabapentin (NEURONTIN) 100 MG capsule Take 1 capsule (100 mg total) by mouth 3 (three) times daily.  . hydrOXYzine (ATARAX/VISTARIL) 10 MG tablet Take 1 tablet  (10 mg total) by mouth 3 (three) times daily as needed for itching.  . Insulin Glargine (LANTUS SOLOSTAR) 100 UNIT/ML Solostar Pen Inject 15 Units into the skin daily.  . insulin lispro (HUMALOG KWIKPEN) 100 UNIT/ML KwikPen INEJCT 0-9 UNITS AS DIRECTED INTO SKIN 3 TIMES A DAY  . Lancets MISC Use as directed three times daily with meals  . metoprolol tartrate (LOPRESSOR) 100 MG tablet TAKE 1 TABLET BY MOUTH TWICE A DAY  . nitroGLYCERIN (NITROSTAT) 0.4 MG SL tablet Place 1 tablet (0.4 mg total) under the tongue every 5 (five) minutes as needed for chest pain. Reported on 04/06/2016  . omeprazole (PRILOSEC) 20 MG capsule TAKE 1 CAPSULE BY MOUTH EVERY DAY  . polyethylene glycol powder (GLYCOLAX/MIRALAX) powder Take 17 gm by mouth daily  . Potassium Chloride ER 20 MEQ TBCR Take 20 mEq by mouth daily.   . risedronate (ACTONEL) 150 MG tablet Take 1 tablet (150 mg total) by mouth every 30 (thirty) days. First Tuesday  . sodium bicarbonate 650 MG tablet Take 650 mg by mouth 2 (two) times daily.  . Tetrahydrozoline HCl (VISINE OP) Place 2 drops into both eyes daily as needed (DRY EYE).  Marland Kitchen thiamine (VITAMIN B-1) 50 MG tablet Take 1 tablet (50 mg total) by mouth daily.  . timolol (TIMOPTIC) 0.5 % ophthalmic solution PLACE 1 DROP IN BOTH EYES DAILY  . traMADol (ULTRAM) 50 MG tablet  TAKE 1 TABLET (50 MG TOTAL) BY MOUTH EVERY 6 (SIX) HOURS AS NEEDED.  . Vitamin D, Ergocalciferol, (DRISDOL) 1.25 MG (50000 UT) CAPS capsule Take 1 capsule (50,000 Units total) by mouth every 7 (seven) days.  Marland Kitchen zolpidem (AMBIEN) 5 MG tablet TAKE 1 TABLET (5 MG TOTAL) BY MOUTH AT BEDTIME AS NEEDED FOR SLEEP.   No current facility-administered medications on file prior to visit.     No Known Allergies   Objective:  Vascular Examination: Capillary refill time immediate x 10 digits.  Dorsalis pedis pulses diminished left; faintly palpable right foot.  Posterior tibial pulses diminihsed left; faintly palpable right  foot.  Digital hair absent x 10 digits.  Skin temperature gradient WNL b/l.   Dermatological Examination: Skin thin, shiny and atrophic b/l.  No open wounds b/l.  No interdigital macerations b/l.  Toenails 2-5 b/l discolored, thick, dystrophic with subungual debris and pain with palpation to nailbeds due to thickness of nails.  Anonychia b/l great toes with evidence of permanent total nail avulsion. Nailbeds completely epithelialized and intact.  Musculoskeletal: Muscle strength 5/5 to all LE muscle groups  Hammertoes b/l.  No pain, crepitus or joint limitation noted with ROM.   Neurological Examination: Protective sensation intact 5/5 b/l with 10 gram monofilament bilaterally.  LOWER EXTREMITY DOPPLER STUDY  Indications: Claudication, peripheral artery disease, and CAD.  High Risk Hypertension, hyperlipidemia, Diabetes, past history of Factors: smoking.   Vascular Interventions: Bilateral common iliac artery stents 08/13/2018.  Performing Technologist: Alvia Grove RVT    Examination Guidelines: A complete evaluation includes at minimum, Doppler waveform signals and systolic blood pressure reading at the level of bilateral brachial, anterior tibial, and posterior tibial arteries, when vessel segments are accessible. Bilateral testing is considered an integral part of a complete examination. Photoelectric Plethysmograph (PPG) waveforms and toe systolic pressure readings are included as required and additional duplex testing as needed. Limited examinations for reoccurring indications may be performed as noted.    ABI Findings: +---------+------------------+-----+--------+--------+ Right    Rt Pressure (mmHg)IndexWaveformComment  +---------+------------------+-----+--------+--------+ Brachial 216                                     +---------+------------------+-----+--------+--------+ PTA      254               1.18 biphasic          +---------+------------------+-----+--------+--------+ DP       139               0.64 biphasic         +---------+------------------+-----+--------+--------+ Great Toe86                0.40 Abnormal         +---------+------------------+-----+--------+--------+  +---------+------------------+-----+--------+-------+ Left     Lt Pressure (mmHg)IndexWaveformComment +---------+------------------+-----+--------+-------+ Brachial 203                                    +---------+------------------+-----+--------+-------+ PTA      119               0.55 biphasic        +---------+------------------+-----+--------+-------+ DP       131               0.61 biphasic        +---------+------------------+-----+--------+-------+ Delbert Harness  0.36 Abnormal        +---------+------------------+-----+--------+-------+  +-------+-----------+-----------+------------+------------+ ABI/TBIToday's ABIToday's TBIPrevious ABIPrevious TBI +-------+-----------+-----------+------------+------------+ Right  [place]         0.40       [place]          0.33         +-------+-----------+-----------+------------+------------+ Left   0.61       0.36       0.58        0.40         +-------+-----------+-----------+------------+------------+  Bilateral ABIs and TBIs appear essentially unchanged.   Summary: Right: Resting right ankle-brachial index indicates noncompressible right lower extremity arteries.The right toe-brachial index is abnormal.  Left: Resting left ankle-brachial index indicates moderate left lower extremity arterial disease. The left toe-brachial index is abnormal.     *See table(s) above for measurements and observations.     Electronically signed by Harold Barban MD on 04/08/2019 at 1:32:53 PM. Final    Assessment: Painful onychomycosis toenails 1-5 b/l  NIDDM with PAD  Plan: 1. Toenails 2-5 b/l were debrided in  length and girth without iatrogenic bleeding. 2. Patient to continue soft, supportive shoe gear 3. Patient to report any pedal injuries to medical professional immediately. 4. Follow up 9 weeks. 5. Patient/POA to call should there be a concern in the interim.

## 2019-07-19 ENCOUNTER — Other Ambulatory Visit: Payer: Self-pay | Admitting: Internal Medicine

## 2019-07-19 ENCOUNTER — Other Ambulatory Visit: Payer: Self-pay

## 2019-07-19 NOTE — Patient Outreach (Signed)
[place] [facility]) Care Management  07/19/2019  [name] [birth year] [id number]   Successful follow up call to patient regarding social work referral for transportation assistance.   Patient declined assistance with SCAT application during initial outreach stating that she would like to inquire about transportation benefit with [employer] first.   As of today, patient has not been in communication with [facility] representative.  Still denied assistance with SCAT application.  Closing social work case but did provide her with my contact information and encouraged her to call if she would like to apply or additional needs arise.  Ronn Melena, BSW Social Worker [phone]

## 2019-07-23 DIAGNOSIS — M25511 Pain in right shoulder: Secondary | ICD-10-CM | POA: Diagnosis not present

## 2019-07-23 DIAGNOSIS — M542 Cervicalgia: Secondary | ICD-10-CM | POA: Diagnosis not present

## 2019-07-24 ENCOUNTER — Encounter: Payer: Self-pay | Admitting: Internal Medicine

## 2019-07-24 DIAGNOSIS — I700 Atherosclerosis of aorta: Secondary | ICD-10-CM | POA: Diagnosis not present

## 2019-07-24 DIAGNOSIS — E1122 Type 2 diabetes mellitus with diabetic chronic kidney disease: Secondary | ICD-10-CM | POA: Diagnosis not present

## 2019-07-24 DIAGNOSIS — I6529 Occlusion and stenosis of unspecified carotid artery: Secondary | ICD-10-CM | POA: Diagnosis not present

## 2019-07-24 DIAGNOSIS — I051 Rheumatic mitral insufficiency: Secondary | ICD-10-CM | POA: Diagnosis not present

## 2019-07-24 DIAGNOSIS — H409 Unspecified glaucoma: Secondary | ICD-10-CM | POA: Diagnosis not present

## 2019-07-24 DIAGNOSIS — D509 Iron deficiency anemia, unspecified: Secondary | ICD-10-CM | POA: Diagnosis not present

## 2019-07-24 DIAGNOSIS — K219 Gastro-esophageal reflux disease without esophagitis: Secondary | ICD-10-CM | POA: Diagnosis not present

## 2019-07-24 DIAGNOSIS — G4700 Insomnia, unspecified: Secondary | ICD-10-CM | POA: Diagnosis not present

## 2019-07-24 DIAGNOSIS — I120 Hypertensive chronic kidney disease with stage 5 chronic kidney disease or end stage renal disease: Secondary | ICD-10-CM | POA: Diagnosis not present

## 2019-07-24 DIAGNOSIS — M25511 Pain in right shoulder: Secondary | ICD-10-CM | POA: Diagnosis not present

## 2019-07-24 DIAGNOSIS — N186 End stage renal disease: Secondary | ICD-10-CM | POA: Diagnosis not present

## 2019-07-24 DIAGNOSIS — Z7902 Long term (current) use of antithrombotics/antiplatelets: Secondary | ICD-10-CM | POA: Diagnosis not present

## 2019-07-24 DIAGNOSIS — M5432 Sciatica, left side: Secondary | ICD-10-CM | POA: Diagnosis not present

## 2019-07-24 DIAGNOSIS — E1151 Type 2 diabetes mellitus with diabetic peripheral angiopathy without gangrene: Secondary | ICD-10-CM | POA: Diagnosis not present

## 2019-07-24 DIAGNOSIS — M1990 Unspecified osteoarthritis, unspecified site: Secondary | ICD-10-CM | POA: Diagnosis not present

## 2019-07-24 DIAGNOSIS — K5900 Constipation, unspecified: Secondary | ICD-10-CM | POA: Diagnosis not present

## 2019-07-24 DIAGNOSIS — K279 Peptic ulcer, site unspecified, unspecified as acute or chronic, without hemorrhage or perforation: Secondary | ICD-10-CM | POA: Diagnosis not present

## 2019-07-24 DIAGNOSIS — E781 Pure hyperglyceridemia: Secondary | ICD-10-CM | POA: Diagnosis not present

## 2019-07-24 DIAGNOSIS — M5412 Radiculopathy, cervical region: Secondary | ICD-10-CM | POA: Diagnosis not present

## 2019-07-24 DIAGNOSIS — G8929 Other chronic pain: Secondary | ICD-10-CM | POA: Diagnosis not present

## 2019-07-24 DIAGNOSIS — I2510 Atherosclerotic heart disease of native coronary artery without angina pectoris: Secondary | ICD-10-CM | POA: Diagnosis not present

## 2019-07-24 DIAGNOSIS — M545 Low back pain: Secondary | ICD-10-CM | POA: Diagnosis not present

## 2019-07-24 DIAGNOSIS — M810 Age-related osteoporosis without current pathological fracture: Secondary | ICD-10-CM | POA: Diagnosis not present

## 2019-07-24 DIAGNOSIS — D519 Vitamin B12 deficiency anemia, unspecified: Secondary | ICD-10-CM | POA: Diagnosis not present

## 2019-07-25 ENCOUNTER — Other Ambulatory Visit: Payer: Self-pay

## 2019-07-25 DIAGNOSIS — I779 Disorder of arteries and arterioles, unspecified: Secondary | ICD-10-CM

## 2019-07-25 DIAGNOSIS — I872 Venous insufficiency (chronic) (peripheral): Secondary | ICD-10-CM

## 2019-07-26 ENCOUNTER — Other Ambulatory Visit: Payer: Self-pay | Admitting: *Deleted

## 2019-07-26 DIAGNOSIS — E1151 Type 2 diabetes mellitus with diabetic peripheral angiopathy without gangrene: Secondary | ICD-10-CM | POA: Diagnosis not present

## 2019-07-26 DIAGNOSIS — D519 Vitamin B12 deficiency anemia, unspecified: Secondary | ICD-10-CM | POA: Diagnosis not present

## 2019-07-26 DIAGNOSIS — I2510 Atherosclerotic heart disease of native coronary artery without angina pectoris: Secondary | ICD-10-CM | POA: Diagnosis not present

## 2019-07-26 DIAGNOSIS — K279 Peptic ulcer, site unspecified, unspecified as acute or chronic, without hemorrhage or perforation: Secondary | ICD-10-CM | POA: Diagnosis not present

## 2019-07-26 DIAGNOSIS — K5900 Constipation, unspecified: Secondary | ICD-10-CM | POA: Diagnosis not present

## 2019-07-26 DIAGNOSIS — E781 Pure hyperglyceridemia: Secondary | ICD-10-CM | POA: Diagnosis not present

## 2019-07-26 DIAGNOSIS — K219 Gastro-esophageal reflux disease without esophagitis: Secondary | ICD-10-CM | POA: Diagnosis not present

## 2019-07-26 DIAGNOSIS — M5412 Radiculopathy, cervical region: Secondary | ICD-10-CM | POA: Diagnosis not present

## 2019-07-26 DIAGNOSIS — N186 End stage renal disease: Secondary | ICD-10-CM | POA: Diagnosis not present

## 2019-07-26 DIAGNOSIS — M810 Age-related osteoporosis without current pathological fracture: Secondary | ICD-10-CM | POA: Diagnosis not present

## 2019-07-26 DIAGNOSIS — M1990 Unspecified osteoarthritis, unspecified site: Secondary | ICD-10-CM | POA: Diagnosis not present

## 2019-07-26 DIAGNOSIS — D509 Iron deficiency anemia, unspecified: Secondary | ICD-10-CM | POA: Diagnosis not present

## 2019-07-26 DIAGNOSIS — I6529 Occlusion and stenosis of unspecified carotid artery: Secondary | ICD-10-CM | POA: Diagnosis not present

## 2019-07-26 DIAGNOSIS — Z7902 Long term (current) use of antithrombotics/antiplatelets: Secondary | ICD-10-CM | POA: Diagnosis not present

## 2019-07-26 DIAGNOSIS — H409 Unspecified glaucoma: Secondary | ICD-10-CM | POA: Diagnosis not present

## 2019-07-26 DIAGNOSIS — G8929 Other chronic pain: Secondary | ICD-10-CM | POA: Diagnosis not present

## 2019-07-26 DIAGNOSIS — M5432 Sciatica, left side: Secondary | ICD-10-CM | POA: Diagnosis not present

## 2019-07-26 DIAGNOSIS — G4700 Insomnia, unspecified: Secondary | ICD-10-CM | POA: Diagnosis not present

## 2019-07-26 DIAGNOSIS — I700 Atherosclerosis of aorta: Secondary | ICD-10-CM | POA: Diagnosis not present

## 2019-07-26 DIAGNOSIS — M545 Low back pain: Secondary | ICD-10-CM | POA: Diagnosis not present

## 2019-07-26 DIAGNOSIS — E1122 Type 2 diabetes mellitus with diabetic chronic kidney disease: Secondary | ICD-10-CM | POA: Diagnosis not present

## 2019-07-26 DIAGNOSIS — I120 Hypertensive chronic kidney disease with stage 5 chronic kidney disease or end stage renal disease: Secondary | ICD-10-CM | POA: Diagnosis not present

## 2019-07-26 DIAGNOSIS — I051 Rheumatic mitral insufficiency: Secondary | ICD-10-CM | POA: Diagnosis not present

## 2019-07-26 DIAGNOSIS — M25511 Pain in right shoulder: Secondary | ICD-10-CM | POA: Diagnosis not present

## 2019-07-26 NOTE — Patient Outreach (Signed)
[place] [facility]) Care Management  07/26/2019  [name] 04/13/1946 [id number]    Telephone Assessment  RN attempted outreach however unsuccessful and unable to leave a HIPAA approved voice message.   Will follow up next week with another outreach call of inquire on pt's ongoing management of care.  Raina Mina, RN Care Management Coordinator [place] Office [phone]

## 2019-07-29 DIAGNOSIS — M1990 Unspecified osteoarthritis, unspecified site: Secondary | ICD-10-CM | POA: Diagnosis not present

## 2019-07-29 DIAGNOSIS — M5412 Radiculopathy, cervical region: Secondary | ICD-10-CM | POA: Diagnosis not present

## 2019-07-29 DIAGNOSIS — E781 Pure hyperglyceridemia: Secondary | ICD-10-CM | POA: Diagnosis not present

## 2019-07-29 DIAGNOSIS — G8929 Other chronic pain: Secondary | ICD-10-CM | POA: Diagnosis not present

## 2019-07-29 DIAGNOSIS — M545 Low back pain: Secondary | ICD-10-CM | POA: Diagnosis not present

## 2019-07-29 DIAGNOSIS — N186 End stage renal disease: Secondary | ICD-10-CM | POA: Diagnosis not present

## 2019-07-29 DIAGNOSIS — H409 Unspecified glaucoma: Secondary | ICD-10-CM | POA: Diagnosis not present

## 2019-07-29 DIAGNOSIS — E1122 Type 2 diabetes mellitus with diabetic chronic kidney disease: Secondary | ICD-10-CM | POA: Diagnosis not present

## 2019-07-29 DIAGNOSIS — D509 Iron deficiency anemia, unspecified: Secondary | ICD-10-CM | POA: Diagnosis not present

## 2019-07-29 DIAGNOSIS — Z7902 Long term (current) use of antithrombotics/antiplatelets: Secondary | ICD-10-CM | POA: Diagnosis not present

## 2019-07-29 DIAGNOSIS — I051 Rheumatic mitral insufficiency: Secondary | ICD-10-CM | POA: Diagnosis not present

## 2019-07-29 DIAGNOSIS — D519 Vitamin B12 deficiency anemia, unspecified: Secondary | ICD-10-CM | POA: Diagnosis not present

## 2019-07-29 DIAGNOSIS — M810 Age-related osteoporosis without current pathological fracture: Secondary | ICD-10-CM | POA: Diagnosis not present

## 2019-07-29 DIAGNOSIS — E1151 Type 2 diabetes mellitus with diabetic peripheral angiopathy without gangrene: Secondary | ICD-10-CM | POA: Diagnosis not present

## 2019-07-29 DIAGNOSIS — M25511 Pain in right shoulder: Secondary | ICD-10-CM | POA: Diagnosis not present

## 2019-07-29 DIAGNOSIS — M5432 Sciatica, left side: Secondary | ICD-10-CM | POA: Diagnosis not present

## 2019-07-29 DIAGNOSIS — I700 Atherosclerosis of aorta: Secondary | ICD-10-CM | POA: Diagnosis not present

## 2019-07-29 DIAGNOSIS — K5900 Constipation, unspecified: Secondary | ICD-10-CM | POA: Diagnosis not present

## 2019-07-29 DIAGNOSIS — I120 Hypertensive chronic kidney disease with stage 5 chronic kidney disease or end stage renal disease: Secondary | ICD-10-CM | POA: Diagnosis not present

## 2019-07-29 DIAGNOSIS — I2510 Atherosclerotic heart disease of native coronary artery without angina pectoris: Secondary | ICD-10-CM | POA: Diagnosis not present

## 2019-07-29 DIAGNOSIS — G4700 Insomnia, unspecified: Secondary | ICD-10-CM | POA: Diagnosis not present

## 2019-07-29 DIAGNOSIS — I6529 Occlusion and stenosis of unspecified carotid artery: Secondary | ICD-10-CM | POA: Diagnosis not present

## 2019-07-29 DIAGNOSIS — K279 Peptic ulcer, site unspecified, unspecified as acute or chronic, without hemorrhage or perforation: Secondary | ICD-10-CM | POA: Diagnosis not present

## 2019-07-29 DIAGNOSIS — K219 Gastro-esophageal reflux disease without esophagitis: Secondary | ICD-10-CM | POA: Diagnosis not present

## 2019-07-30 ENCOUNTER — Other Ambulatory Visit: Payer: Self-pay | Admitting: Internal Medicine

## 2019-07-30 DIAGNOSIS — Z7902 Long term (current) use of antithrombotics/antiplatelets: Secondary | ICD-10-CM | POA: Diagnosis not present

## 2019-07-30 DIAGNOSIS — K219 Gastro-esophageal reflux disease without esophagitis: Secondary | ICD-10-CM | POA: Diagnosis not present

## 2019-07-30 DIAGNOSIS — E1122 Type 2 diabetes mellitus with diabetic chronic kidney disease: Secondary | ICD-10-CM | POA: Diagnosis not present

## 2019-07-30 DIAGNOSIS — I6529 Occlusion and stenosis of unspecified carotid artery: Secondary | ICD-10-CM | POA: Diagnosis not present

## 2019-07-30 DIAGNOSIS — I2510 Atherosclerotic heart disease of native coronary artery without angina pectoris: Secondary | ICD-10-CM | POA: Diagnosis not present

## 2019-07-30 DIAGNOSIS — D519 Vitamin B12 deficiency anemia, unspecified: Secondary | ICD-10-CM | POA: Diagnosis not present

## 2019-07-30 DIAGNOSIS — H409 Unspecified glaucoma: Secondary | ICD-10-CM | POA: Diagnosis not present

## 2019-07-30 DIAGNOSIS — K279 Peptic ulcer, site unspecified, unspecified as acute or chronic, without hemorrhage or perforation: Secondary | ICD-10-CM | POA: Diagnosis not present

## 2019-07-30 DIAGNOSIS — M545 Low back pain: Secondary | ICD-10-CM | POA: Diagnosis not present

## 2019-07-30 DIAGNOSIS — M810 Age-related osteoporosis without current pathological fracture: Secondary | ICD-10-CM | POA: Diagnosis not present

## 2019-07-30 DIAGNOSIS — N186 End stage renal disease: Secondary | ICD-10-CM | POA: Diagnosis not present

## 2019-07-30 DIAGNOSIS — K5900 Constipation, unspecified: Secondary | ICD-10-CM | POA: Diagnosis not present

## 2019-07-30 DIAGNOSIS — G8929 Other chronic pain: Secondary | ICD-10-CM | POA: Diagnosis not present

## 2019-07-30 DIAGNOSIS — M1990 Unspecified osteoarthritis, unspecified site: Secondary | ICD-10-CM | POA: Diagnosis not present

## 2019-07-30 DIAGNOSIS — D509 Iron deficiency anemia, unspecified: Secondary | ICD-10-CM | POA: Diagnosis not present

## 2019-07-30 DIAGNOSIS — I700 Atherosclerosis of aorta: Secondary | ICD-10-CM | POA: Diagnosis not present

## 2019-07-30 DIAGNOSIS — E1151 Type 2 diabetes mellitus with diabetic peripheral angiopathy without gangrene: Secondary | ICD-10-CM | POA: Diagnosis not present

## 2019-07-30 DIAGNOSIS — M5432 Sciatica, left side: Secondary | ICD-10-CM | POA: Diagnosis not present

## 2019-07-30 DIAGNOSIS — M5412 Radiculopathy, cervical region: Secondary | ICD-10-CM | POA: Diagnosis not present

## 2019-07-30 DIAGNOSIS — M25511 Pain in right shoulder: Secondary | ICD-10-CM | POA: Diagnosis not present

## 2019-07-30 DIAGNOSIS — E781 Pure hyperglyceridemia: Secondary | ICD-10-CM | POA: Diagnosis not present

## 2019-07-30 DIAGNOSIS — I120 Hypertensive chronic kidney disease with stage 5 chronic kidney disease or end stage renal disease: Secondary | ICD-10-CM | POA: Diagnosis not present

## 2019-07-30 DIAGNOSIS — G4700 Insomnia, unspecified: Secondary | ICD-10-CM | POA: Diagnosis not present

## 2019-07-30 DIAGNOSIS — I051 Rheumatic mitral insufficiency: Secondary | ICD-10-CM | POA: Diagnosis not present

## 2019-07-31 ENCOUNTER — Other Ambulatory Visit: Payer: Self-pay | Admitting: *Deleted

## 2019-07-31 NOTE — Patient Outreach (Signed)
[place] [facility]) Care Management  07/31/2019  [name] 11/18/1945 [id number]  Telephone Assessment-2nd unsuccessful outreach  RN outreached to pt today however unsuccessful and unable to leave a voice message with voice mail not set up and the other contact number not valid.   Will send outreach letter to alert pt of contacts. Will scheduled another outreach call with in the next 4 days.  Raina Mina, RN Care Management Coordinator [place] Office [phone]

## 2019-08-01 ENCOUNTER — Telehealth (HOSPITAL_COMMUNITY): Payer: Self-pay

## 2019-08-01 DIAGNOSIS — M810 Age-related osteoporosis without current pathological fracture: Secondary | ICD-10-CM | POA: Diagnosis not present

## 2019-08-01 DIAGNOSIS — M25511 Pain in right shoulder: Secondary | ICD-10-CM | POA: Diagnosis not present

## 2019-08-01 DIAGNOSIS — D519 Vitamin B12 deficiency anemia, unspecified: Secondary | ICD-10-CM | POA: Diagnosis not present

## 2019-08-01 DIAGNOSIS — N186 End stage renal disease: Secondary | ICD-10-CM | POA: Diagnosis not present

## 2019-08-01 DIAGNOSIS — E1122 Type 2 diabetes mellitus with diabetic chronic kidney disease: Secondary | ICD-10-CM | POA: Diagnosis not present

## 2019-08-01 DIAGNOSIS — E1151 Type 2 diabetes mellitus with diabetic peripheral angiopathy without gangrene: Secondary | ICD-10-CM | POA: Diagnosis not present

## 2019-08-01 DIAGNOSIS — I700 Atherosclerosis of aorta: Secondary | ICD-10-CM | POA: Diagnosis not present

## 2019-08-01 DIAGNOSIS — I051 Rheumatic mitral insufficiency: Secondary | ICD-10-CM | POA: Diagnosis not present

## 2019-08-01 DIAGNOSIS — M5432 Sciatica, left side: Secondary | ICD-10-CM | POA: Diagnosis not present

## 2019-08-01 DIAGNOSIS — K5900 Constipation, unspecified: Secondary | ICD-10-CM | POA: Diagnosis not present

## 2019-08-01 DIAGNOSIS — I2510 Atherosclerotic heart disease of native coronary artery without angina pectoris: Secondary | ICD-10-CM | POA: Diagnosis not present

## 2019-08-01 DIAGNOSIS — I6529 Occlusion and stenosis of unspecified carotid artery: Secondary | ICD-10-CM | POA: Diagnosis not present

## 2019-08-01 DIAGNOSIS — H409 Unspecified glaucoma: Secondary | ICD-10-CM | POA: Diagnosis not present

## 2019-08-01 DIAGNOSIS — Z7902 Long term (current) use of antithrombotics/antiplatelets: Secondary | ICD-10-CM | POA: Diagnosis not present

## 2019-08-01 DIAGNOSIS — M1990 Unspecified osteoarthritis, unspecified site: Secondary | ICD-10-CM | POA: Diagnosis not present

## 2019-08-01 DIAGNOSIS — G8929 Other chronic pain: Secondary | ICD-10-CM | POA: Diagnosis not present

## 2019-08-01 DIAGNOSIS — K279 Peptic ulcer, site unspecified, unspecified as acute or chronic, without hemorrhage or perforation: Secondary | ICD-10-CM | POA: Diagnosis not present

## 2019-08-01 DIAGNOSIS — M5412 Radiculopathy, cervical region: Secondary | ICD-10-CM | POA: Diagnosis not present

## 2019-08-01 DIAGNOSIS — M545 Low back pain: Secondary | ICD-10-CM | POA: Diagnosis not present

## 2019-08-01 DIAGNOSIS — E781 Pure hyperglyceridemia: Secondary | ICD-10-CM | POA: Diagnosis not present

## 2019-08-01 DIAGNOSIS — G4700 Insomnia, unspecified: Secondary | ICD-10-CM | POA: Diagnosis not present

## 2019-08-01 DIAGNOSIS — K219 Gastro-esophageal reflux disease without esophagitis: Secondary | ICD-10-CM | POA: Diagnosis not present

## 2019-08-01 DIAGNOSIS — D509 Iron deficiency anemia, unspecified: Secondary | ICD-10-CM | POA: Diagnosis not present

## 2019-08-01 DIAGNOSIS — I120 Hypertensive chronic kidney disease with stage 5 chronic kidney disease or end stage renal disease: Secondary | ICD-10-CM | POA: Diagnosis not present

## 2019-08-01 NOTE — Telephone Encounter (Signed)
The above patient or their representative was contacted and gave the following answers to these questions:         Do you have any of the following symptoms?    NO  Fever                    Cough                   Shortness of breath  Do  you have any of the following other symptoms? no   muscle pain         vomiting,        diarrhea        rash         weakness        red eye        abdominal pain         bruising          bruising or bleeding              joint pain           severe headache    Have you been in contact with someone who was or has been sick in the past 2 weeks?  NO  Yes                 Unsure                         Unable to assess   Does the person that you were in contact with have any of the following symptoms?   Cough         shortness of breath           muscle pain         vomiting,            diarrhea            rash            weakness           fever            red eye           abdominal pain           bruising  or  bleeding                joint pain                severe headache                 COMMENTS OR ACTION PLAN FOR THIS PATIENT:

## 2019-08-02 ENCOUNTER — Encounter (HOSPITAL_COMMUNITY): Payer: Medicare Other

## 2019-08-02 ENCOUNTER — Ambulatory Visit: Payer: Medicare Other | Admitting: Family

## 2019-08-02 DIAGNOSIS — E1122 Type 2 diabetes mellitus with diabetic chronic kidney disease: Secondary | ICD-10-CM | POA: Diagnosis not present

## 2019-08-02 DIAGNOSIS — K5900 Constipation, unspecified: Secondary | ICD-10-CM | POA: Diagnosis not present

## 2019-08-02 DIAGNOSIS — E781 Pure hyperglyceridemia: Secondary | ICD-10-CM | POA: Diagnosis not present

## 2019-08-02 DIAGNOSIS — M5432 Sciatica, left side: Secondary | ICD-10-CM | POA: Diagnosis not present

## 2019-08-02 DIAGNOSIS — E1151 Type 2 diabetes mellitus with diabetic peripheral angiopathy without gangrene: Secondary | ICD-10-CM | POA: Diagnosis not present

## 2019-08-02 DIAGNOSIS — G4700 Insomnia, unspecified: Secondary | ICD-10-CM | POA: Diagnosis not present

## 2019-08-02 DIAGNOSIS — D509 Iron deficiency anemia, unspecified: Secondary | ICD-10-CM | POA: Diagnosis not present

## 2019-08-02 DIAGNOSIS — M810 Age-related osteoporosis without current pathological fracture: Secondary | ICD-10-CM | POA: Diagnosis not present

## 2019-08-02 DIAGNOSIS — D519 Vitamin B12 deficiency anemia, unspecified: Secondary | ICD-10-CM | POA: Diagnosis not present

## 2019-08-02 DIAGNOSIS — Z7902 Long term (current) use of antithrombotics/antiplatelets: Secondary | ICD-10-CM | POA: Diagnosis not present

## 2019-08-02 DIAGNOSIS — M1990 Unspecified osteoarthritis, unspecified site: Secondary | ICD-10-CM | POA: Diagnosis not present

## 2019-08-02 DIAGNOSIS — I700 Atherosclerosis of aorta: Secondary | ICD-10-CM | POA: Diagnosis not present

## 2019-08-02 DIAGNOSIS — I2510 Atherosclerotic heart disease of native coronary artery without angina pectoris: Secondary | ICD-10-CM | POA: Diagnosis not present

## 2019-08-02 DIAGNOSIS — I051 Rheumatic mitral insufficiency: Secondary | ICD-10-CM | POA: Diagnosis not present

## 2019-08-02 DIAGNOSIS — H409 Unspecified glaucoma: Secondary | ICD-10-CM | POA: Diagnosis not present

## 2019-08-02 DIAGNOSIS — I120 Hypertensive chronic kidney disease with stage 5 chronic kidney disease or end stage renal disease: Secondary | ICD-10-CM | POA: Diagnosis not present

## 2019-08-02 DIAGNOSIS — K279 Peptic ulcer, site unspecified, unspecified as acute or chronic, without hemorrhage or perforation: Secondary | ICD-10-CM | POA: Diagnosis not present

## 2019-08-02 DIAGNOSIS — K219 Gastro-esophageal reflux disease without esophagitis: Secondary | ICD-10-CM | POA: Diagnosis not present

## 2019-08-02 DIAGNOSIS — I6529 Occlusion and stenosis of unspecified carotid artery: Secondary | ICD-10-CM | POA: Diagnosis not present

## 2019-08-02 DIAGNOSIS — N186 End stage renal disease: Secondary | ICD-10-CM | POA: Diagnosis not present

## 2019-08-02 DIAGNOSIS — G8929 Other chronic pain: Secondary | ICD-10-CM | POA: Diagnosis not present

## 2019-08-02 DIAGNOSIS — M545 Low back pain: Secondary | ICD-10-CM | POA: Diagnosis not present

## 2019-08-02 DIAGNOSIS — M5412 Radiculopathy, cervical region: Secondary | ICD-10-CM | POA: Diagnosis not present

## 2019-08-02 DIAGNOSIS — M25511 Pain in right shoulder: Secondary | ICD-10-CM | POA: Diagnosis not present

## 2019-08-05 DIAGNOSIS — M5432 Sciatica, left side: Secondary | ICD-10-CM | POA: Diagnosis not present

## 2019-08-05 DIAGNOSIS — M810 Age-related osteoporosis without current pathological fracture: Secondary | ICD-10-CM | POA: Diagnosis not present

## 2019-08-05 DIAGNOSIS — E781 Pure hyperglyceridemia: Secondary | ICD-10-CM | POA: Diagnosis not present

## 2019-08-05 DIAGNOSIS — M1990 Unspecified osteoarthritis, unspecified site: Secondary | ICD-10-CM | POA: Diagnosis not present

## 2019-08-05 DIAGNOSIS — K5900 Constipation, unspecified: Secondary | ICD-10-CM | POA: Diagnosis not present

## 2019-08-05 DIAGNOSIS — M5412 Radiculopathy, cervical region: Secondary | ICD-10-CM | POA: Diagnosis not present

## 2019-08-05 DIAGNOSIS — D509 Iron deficiency anemia, unspecified: Secondary | ICD-10-CM | POA: Diagnosis not present

## 2019-08-05 DIAGNOSIS — I2510 Atherosclerotic heart disease of native coronary artery without angina pectoris: Secondary | ICD-10-CM | POA: Diagnosis not present

## 2019-08-05 DIAGNOSIS — E1151 Type 2 diabetes mellitus with diabetic peripheral angiopathy without gangrene: Secondary | ICD-10-CM | POA: Diagnosis not present

## 2019-08-05 DIAGNOSIS — I700 Atherosclerosis of aorta: Secondary | ICD-10-CM | POA: Diagnosis not present

## 2019-08-05 DIAGNOSIS — E1122 Type 2 diabetes mellitus with diabetic chronic kidney disease: Secondary | ICD-10-CM | POA: Diagnosis not present

## 2019-08-05 DIAGNOSIS — N186 End stage renal disease: Secondary | ICD-10-CM | POA: Diagnosis not present

## 2019-08-05 DIAGNOSIS — H409 Unspecified glaucoma: Secondary | ICD-10-CM | POA: Diagnosis not present

## 2019-08-05 DIAGNOSIS — Z7902 Long term (current) use of antithrombotics/antiplatelets: Secondary | ICD-10-CM | POA: Diagnosis not present

## 2019-08-05 DIAGNOSIS — K219 Gastro-esophageal reflux disease without esophagitis: Secondary | ICD-10-CM | POA: Diagnosis not present

## 2019-08-05 DIAGNOSIS — M545 Low back pain: Secondary | ICD-10-CM | POA: Diagnosis not present

## 2019-08-05 DIAGNOSIS — K279 Peptic ulcer, site unspecified, unspecified as acute or chronic, without hemorrhage or perforation: Secondary | ICD-10-CM | POA: Diagnosis not present

## 2019-08-05 DIAGNOSIS — G4700 Insomnia, unspecified: Secondary | ICD-10-CM | POA: Diagnosis not present

## 2019-08-05 DIAGNOSIS — I6529 Occlusion and stenosis of unspecified carotid artery: Secondary | ICD-10-CM | POA: Diagnosis not present

## 2019-08-05 DIAGNOSIS — D519 Vitamin B12 deficiency anemia, unspecified: Secondary | ICD-10-CM | POA: Diagnosis not present

## 2019-08-05 DIAGNOSIS — I120 Hypertensive chronic kidney disease with stage 5 chronic kidney disease or end stage renal disease: Secondary | ICD-10-CM | POA: Diagnosis not present

## 2019-08-05 DIAGNOSIS — M25511 Pain in right shoulder: Secondary | ICD-10-CM | POA: Diagnosis not present

## 2019-08-05 DIAGNOSIS — G8929 Other chronic pain: Secondary | ICD-10-CM | POA: Diagnosis not present

## 2019-08-05 DIAGNOSIS — I051 Rheumatic mitral insufficiency: Secondary | ICD-10-CM | POA: Diagnosis not present

## 2019-08-06 ENCOUNTER — Encounter: Payer: Self-pay | Admitting: *Deleted

## 2019-08-06 ENCOUNTER — Other Ambulatory Visit: Payer: Self-pay | Admitting: *Deleted

## 2019-08-06 DIAGNOSIS — G4700 Insomnia, unspecified: Secondary | ICD-10-CM | POA: Diagnosis not present

## 2019-08-06 DIAGNOSIS — M545 Low back pain: Secondary | ICD-10-CM | POA: Diagnosis not present

## 2019-08-06 DIAGNOSIS — I120 Hypertensive chronic kidney disease with stage 5 chronic kidney disease or end stage renal disease: Secondary | ICD-10-CM | POA: Diagnosis not present

## 2019-08-06 DIAGNOSIS — M25511 Pain in right shoulder: Secondary | ICD-10-CM | POA: Diagnosis not present

## 2019-08-06 DIAGNOSIS — I2510 Atherosclerotic heart disease of native coronary artery without angina pectoris: Secondary | ICD-10-CM | POA: Diagnosis not present

## 2019-08-06 DIAGNOSIS — M5412 Radiculopathy, cervical region: Secondary | ICD-10-CM | POA: Diagnosis not present

## 2019-08-06 DIAGNOSIS — M5432 Sciatica, left side: Secondary | ICD-10-CM | POA: Diagnosis not present

## 2019-08-06 DIAGNOSIS — E781 Pure hyperglyceridemia: Secondary | ICD-10-CM | POA: Diagnosis not present

## 2019-08-06 DIAGNOSIS — D509 Iron deficiency anemia, unspecified: Secondary | ICD-10-CM | POA: Diagnosis not present

## 2019-08-06 DIAGNOSIS — I6529 Occlusion and stenosis of unspecified carotid artery: Secondary | ICD-10-CM | POA: Diagnosis not present

## 2019-08-06 DIAGNOSIS — E1122 Type 2 diabetes mellitus with diabetic chronic kidney disease: Secondary | ICD-10-CM | POA: Diagnosis not present

## 2019-08-06 DIAGNOSIS — K5900 Constipation, unspecified: Secondary | ICD-10-CM | POA: Diagnosis not present

## 2019-08-06 DIAGNOSIS — K279 Peptic ulcer, site unspecified, unspecified as acute or chronic, without hemorrhage or perforation: Secondary | ICD-10-CM | POA: Diagnosis not present

## 2019-08-06 DIAGNOSIS — M810 Age-related osteoporosis without current pathological fracture: Secondary | ICD-10-CM | POA: Diagnosis not present

## 2019-08-06 DIAGNOSIS — I051 Rheumatic mitral insufficiency: Secondary | ICD-10-CM | POA: Diagnosis not present

## 2019-08-06 DIAGNOSIS — M1990 Unspecified osteoarthritis, unspecified site: Secondary | ICD-10-CM | POA: Diagnosis not present

## 2019-08-06 DIAGNOSIS — D519 Vitamin B12 deficiency anemia, unspecified: Secondary | ICD-10-CM | POA: Diagnosis not present

## 2019-08-06 DIAGNOSIS — E1151 Type 2 diabetes mellitus with diabetic peripheral angiopathy without gangrene: Secondary | ICD-10-CM | POA: Diagnosis not present

## 2019-08-06 DIAGNOSIS — N186 End stage renal disease: Secondary | ICD-10-CM | POA: Diagnosis not present

## 2019-08-06 DIAGNOSIS — Z7902 Long term (current) use of antithrombotics/antiplatelets: Secondary | ICD-10-CM | POA: Diagnosis not present

## 2019-08-06 DIAGNOSIS — I700 Atherosclerosis of aorta: Secondary | ICD-10-CM | POA: Diagnosis not present

## 2019-08-06 DIAGNOSIS — H409 Unspecified glaucoma: Secondary | ICD-10-CM | POA: Diagnosis not present

## 2019-08-06 DIAGNOSIS — K219 Gastro-esophageal reflux disease without esophagitis: Secondary | ICD-10-CM | POA: Diagnosis not present

## 2019-08-06 DIAGNOSIS — G8929 Other chronic pain: Secondary | ICD-10-CM | POA: Diagnosis not present

## 2019-08-06 NOTE — Patient Outreach (Addendum)
[facility]) Care Management  08/06/2019  [name] 1945/11/24 [id number]    Telephone Assessment-Successful-Diabetes.  RN spoke with the pt today and completed the initial assessment. Discussed ongoing issues with swelling at pt indicated she is awaiting the [facility] agency to supply her with compression stockings. RN encouraged pt to call since it's been a few weeks. Also discussed elevating her LE to reduce swelling. Verified her swelling remains the same with no additional swelling. Other resources provided to obtain compression stockings. RN suggested measurements if needed however stress the importance of wearing the stockings to prevent ongoing swelling (verbalized an understanding).  Pt aware to check her feet and lubricate her skin to prevent skin irritation. Verified pt has seen her eye provider (twice annually with the last visit in June). Verified pt's AM glucose has been under 200 with this morning at 187. Discussed foods to avoid or limited that have an impact on her readings. Strongly encourage pt to review the dietary information to assist with better food choices related to her diabetes. Care plan discussed with goals and interventions that have been adjusted to accommodate pt's progress. Will discuss each goal and how to improve pt's ongoing management of care with available tools and education needed. No inquires or questions today related.  Will encouraged pt to continue to comply with the plan of care currently in place and reviewed the sent information packet related to diabetes.  Will follow up next month with an update on pt's ongoing progress.  THN CM Care Plan Problem One     Most Recent Value  Care Plan Problem One  Knowledge Deficit of diabetes control related to A1c-8.1%  Role Documenting the Problem One  Care Management [place] for Problem One  Active  THN Long Term Goal   Pt will report a reduction in A1c 1-2 points in the next  90 days.  THN Long Term Goal Start Date  07/11/19  Interventions for Problem One Long Term Goal  Will continue to discuss the improtance of reducing her AIC to avoid diabetic complications. Will re-evaluate pt's understanding based upon the information packet sent to pt.  THN CM Short Term Goal #1   Pt will be able to recognize a healthy food items related to modification to create an appropriate diabetic diet,  THN CM Short Term Goal #1 Start Date  07/11/19  Interventions for Short Term Goal #1  Will extend to allow pt to review the printed material on dietary habits for a diabetic in improving her eating habits.   THN CM Short Term Goal #2   Bilateral swelling to LE will reduce within the next 30 days.  THN CM Short Term Goal #2 Start Date  07/11/19  Interventions for Short Term Goal #2  WIll discuss the importance of elevated LE and strongly encouraged pt to obtain TEDs      Raina Mina, RN Care Management Coordinator [place] Office [phone]

## 2019-08-07 ENCOUNTER — Telehealth: Payer: Self-pay | Admitting: Internal Medicine

## 2019-08-07 DIAGNOSIS — K219 Gastro-esophageal reflux disease without esophagitis: Secondary | ICD-10-CM | POA: Diagnosis not present

## 2019-08-07 DIAGNOSIS — H409 Unspecified glaucoma: Secondary | ICD-10-CM | POA: Diagnosis not present

## 2019-08-07 DIAGNOSIS — I700 Atherosclerosis of aorta: Secondary | ICD-10-CM | POA: Diagnosis not present

## 2019-08-07 DIAGNOSIS — E1151 Type 2 diabetes mellitus with diabetic peripheral angiopathy without gangrene: Secondary | ICD-10-CM | POA: Diagnosis not present

## 2019-08-07 DIAGNOSIS — D509 Iron deficiency anemia, unspecified: Secondary | ICD-10-CM | POA: Diagnosis not present

## 2019-08-07 DIAGNOSIS — D519 Vitamin B12 deficiency anemia, unspecified: Secondary | ICD-10-CM | POA: Diagnosis not present

## 2019-08-07 DIAGNOSIS — I2510 Atherosclerotic heart disease of native coronary artery without angina pectoris: Secondary | ICD-10-CM | POA: Diagnosis not present

## 2019-08-07 DIAGNOSIS — M5432 Sciatica, left side: Secondary | ICD-10-CM | POA: Diagnosis not present

## 2019-08-07 DIAGNOSIS — Z7902 Long term (current) use of antithrombotics/antiplatelets: Secondary | ICD-10-CM | POA: Diagnosis not present

## 2019-08-07 DIAGNOSIS — M810 Age-related osteoporosis without current pathological fracture: Secondary | ICD-10-CM | POA: Diagnosis not present

## 2019-08-07 DIAGNOSIS — E781 Pure hyperglyceridemia: Secondary | ICD-10-CM | POA: Diagnosis not present

## 2019-08-07 DIAGNOSIS — M545 Low back pain: Secondary | ICD-10-CM | POA: Diagnosis not present

## 2019-08-07 DIAGNOSIS — G8929 Other chronic pain: Secondary | ICD-10-CM | POA: Diagnosis not present

## 2019-08-07 DIAGNOSIS — M1990 Unspecified osteoarthritis, unspecified site: Secondary | ICD-10-CM | POA: Diagnosis not present

## 2019-08-07 DIAGNOSIS — M5412 Radiculopathy, cervical region: Secondary | ICD-10-CM | POA: Diagnosis not present

## 2019-08-07 DIAGNOSIS — N186 End stage renal disease: Secondary | ICD-10-CM | POA: Diagnosis not present

## 2019-08-07 DIAGNOSIS — K279 Peptic ulcer, site unspecified, unspecified as acute or chronic, without hemorrhage or perforation: Secondary | ICD-10-CM | POA: Diagnosis not present

## 2019-08-07 DIAGNOSIS — M170 Bilateral primary osteoarthritis of knee: Secondary | ICD-10-CM

## 2019-08-07 DIAGNOSIS — I6529 Occlusion and stenosis of unspecified carotid artery: Secondary | ICD-10-CM | POA: Diagnosis not present

## 2019-08-07 DIAGNOSIS — I120 Hypertensive chronic kidney disease with stage 5 chronic kidney disease or end stage renal disease: Secondary | ICD-10-CM | POA: Diagnosis not present

## 2019-08-07 DIAGNOSIS — R296 Repeated falls: Secondary | ICD-10-CM

## 2019-08-07 DIAGNOSIS — I051 Rheumatic mitral insufficiency: Secondary | ICD-10-CM | POA: Diagnosis not present

## 2019-08-07 DIAGNOSIS — E1122 Type 2 diabetes mellitus with diabetic chronic kidney disease: Secondary | ICD-10-CM | POA: Diagnosis not present

## 2019-08-07 DIAGNOSIS — K5900 Constipation, unspecified: Secondary | ICD-10-CM | POA: Diagnosis not present

## 2019-08-07 DIAGNOSIS — M25511 Pain in right shoulder: Secondary | ICD-10-CM | POA: Diagnosis not present

## 2019-08-07 DIAGNOSIS — G4700 Insomnia, unspecified: Secondary | ICD-10-CM | POA: Diagnosis not present

## 2019-08-07 NOTE — Telephone Encounter (Signed)
To fax per request

## 2019-08-08 DIAGNOSIS — K5900 Constipation, unspecified: Secondary | ICD-10-CM | POA: Diagnosis not present

## 2019-08-08 DIAGNOSIS — G4700 Insomnia, unspecified: Secondary | ICD-10-CM | POA: Diagnosis not present

## 2019-08-08 DIAGNOSIS — E1122 Type 2 diabetes mellitus with diabetic chronic kidney disease: Secondary | ICD-10-CM | POA: Diagnosis not present

## 2019-08-08 DIAGNOSIS — M5412 Radiculopathy, cervical region: Secondary | ICD-10-CM | POA: Diagnosis not present

## 2019-08-08 DIAGNOSIS — D509 Iron deficiency anemia, unspecified: Secondary | ICD-10-CM | POA: Diagnosis not present

## 2019-08-08 DIAGNOSIS — I120 Hypertensive chronic kidney disease with stage 5 chronic kidney disease or end stage renal disease: Secondary | ICD-10-CM | POA: Diagnosis not present

## 2019-08-08 DIAGNOSIS — N186 End stage renal disease: Secondary | ICD-10-CM | POA: Diagnosis not present

## 2019-08-08 DIAGNOSIS — H409 Unspecified glaucoma: Secondary | ICD-10-CM | POA: Diagnosis not present

## 2019-08-08 DIAGNOSIS — I2510 Atherosclerotic heart disease of native coronary artery without angina pectoris: Secondary | ICD-10-CM | POA: Diagnosis not present

## 2019-08-08 DIAGNOSIS — E781 Pure hyperglyceridemia: Secondary | ICD-10-CM | POA: Diagnosis not present

## 2019-08-08 DIAGNOSIS — K219 Gastro-esophageal reflux disease without esophagitis: Secondary | ICD-10-CM | POA: Diagnosis not present

## 2019-08-08 DIAGNOSIS — E1151 Type 2 diabetes mellitus with diabetic peripheral angiopathy without gangrene: Secondary | ICD-10-CM | POA: Diagnosis not present

## 2019-08-08 DIAGNOSIS — M545 Low back pain: Secondary | ICD-10-CM | POA: Diagnosis not present

## 2019-08-08 DIAGNOSIS — K279 Peptic ulcer, site unspecified, unspecified as acute or chronic, without hemorrhage or perforation: Secondary | ICD-10-CM | POA: Diagnosis not present

## 2019-08-08 DIAGNOSIS — I051 Rheumatic mitral insufficiency: Secondary | ICD-10-CM | POA: Diagnosis not present

## 2019-08-08 DIAGNOSIS — Z7902 Long term (current) use of antithrombotics/antiplatelets: Secondary | ICD-10-CM | POA: Diagnosis not present

## 2019-08-08 DIAGNOSIS — I6529 Occlusion and stenosis of unspecified carotid artery: Secondary | ICD-10-CM | POA: Diagnosis not present

## 2019-08-08 DIAGNOSIS — M810 Age-related osteoporosis without current pathological fracture: Secondary | ICD-10-CM | POA: Diagnosis not present

## 2019-08-08 DIAGNOSIS — D519 Vitamin B12 deficiency anemia, unspecified: Secondary | ICD-10-CM | POA: Diagnosis not present

## 2019-08-08 DIAGNOSIS — M5432 Sciatica, left side: Secondary | ICD-10-CM | POA: Diagnosis not present

## 2019-08-08 DIAGNOSIS — M25511 Pain in right shoulder: Secondary | ICD-10-CM | POA: Diagnosis not present

## 2019-08-08 DIAGNOSIS — M1990 Unspecified osteoarthritis, unspecified site: Secondary | ICD-10-CM | POA: Diagnosis not present

## 2019-08-08 DIAGNOSIS — G8929 Other chronic pain: Secondary | ICD-10-CM | POA: Diagnosis not present

## 2019-08-08 DIAGNOSIS — I700 Atherosclerosis of aorta: Secondary | ICD-10-CM | POA: Diagnosis not present

## 2019-08-09 DIAGNOSIS — M810 Age-related osteoporosis without current pathological fracture: Secondary | ICD-10-CM | POA: Diagnosis not present

## 2019-08-09 DIAGNOSIS — D519 Vitamin B12 deficiency anemia, unspecified: Secondary | ICD-10-CM | POA: Diagnosis not present

## 2019-08-09 DIAGNOSIS — K5900 Constipation, unspecified: Secondary | ICD-10-CM | POA: Diagnosis not present

## 2019-08-09 DIAGNOSIS — I2510 Atherosclerotic heart disease of native coronary artery without angina pectoris: Secondary | ICD-10-CM | POA: Diagnosis not present

## 2019-08-09 DIAGNOSIS — G8929 Other chronic pain: Secondary | ICD-10-CM | POA: Diagnosis not present

## 2019-08-09 DIAGNOSIS — E1151 Type 2 diabetes mellitus with diabetic peripheral angiopathy without gangrene: Secondary | ICD-10-CM | POA: Diagnosis not present

## 2019-08-09 DIAGNOSIS — E781 Pure hyperglyceridemia: Secondary | ICD-10-CM | POA: Diagnosis not present

## 2019-08-09 DIAGNOSIS — M5432 Sciatica, left side: Secondary | ICD-10-CM | POA: Diagnosis not present

## 2019-08-09 DIAGNOSIS — E1122 Type 2 diabetes mellitus with diabetic chronic kidney disease: Secondary | ICD-10-CM | POA: Diagnosis not present

## 2019-08-09 DIAGNOSIS — K219 Gastro-esophageal reflux disease without esophagitis: Secondary | ICD-10-CM | POA: Diagnosis not present

## 2019-08-09 DIAGNOSIS — H409 Unspecified glaucoma: Secondary | ICD-10-CM | POA: Diagnosis not present

## 2019-08-09 DIAGNOSIS — I051 Rheumatic mitral insufficiency: Secondary | ICD-10-CM | POA: Diagnosis not present

## 2019-08-09 DIAGNOSIS — K279 Peptic ulcer, site unspecified, unspecified as acute or chronic, without hemorrhage or perforation: Secondary | ICD-10-CM | POA: Diagnosis not present

## 2019-08-09 DIAGNOSIS — G4700 Insomnia, unspecified: Secondary | ICD-10-CM | POA: Diagnosis not present

## 2019-08-09 DIAGNOSIS — Z7902 Long term (current) use of antithrombotics/antiplatelets: Secondary | ICD-10-CM | POA: Diagnosis not present

## 2019-08-09 DIAGNOSIS — I120 Hypertensive chronic kidney disease with stage 5 chronic kidney disease or end stage renal disease: Secondary | ICD-10-CM | POA: Diagnosis not present

## 2019-08-09 DIAGNOSIS — M1990 Unspecified osteoarthritis, unspecified site: Secondary | ICD-10-CM | POA: Diagnosis not present

## 2019-08-09 DIAGNOSIS — M545 Low back pain: Secondary | ICD-10-CM | POA: Diagnosis not present

## 2019-08-09 DIAGNOSIS — D509 Iron deficiency anemia, unspecified: Secondary | ICD-10-CM | POA: Diagnosis not present

## 2019-08-09 DIAGNOSIS — M5412 Radiculopathy, cervical region: Secondary | ICD-10-CM | POA: Diagnosis not present

## 2019-08-09 DIAGNOSIS — I700 Atherosclerosis of aorta: Secondary | ICD-10-CM | POA: Diagnosis not present

## 2019-08-09 DIAGNOSIS — N186 End stage renal disease: Secondary | ICD-10-CM | POA: Diagnosis not present

## 2019-08-09 DIAGNOSIS — M25511 Pain in right shoulder: Secondary | ICD-10-CM | POA: Diagnosis not present

## 2019-08-09 DIAGNOSIS — I6529 Occlusion and stenosis of unspecified carotid artery: Secondary | ICD-10-CM | POA: Diagnosis not present

## 2019-08-13 ENCOUNTER — Other Ambulatory Visit: Payer: Self-pay | Admitting: Internal Medicine

## 2019-08-13 DIAGNOSIS — M25511 Pain in right shoulder: Secondary | ICD-10-CM | POA: Diagnosis not present

## 2019-08-13 DIAGNOSIS — I6529 Occlusion and stenosis of unspecified carotid artery: Secondary | ICD-10-CM | POA: Diagnosis not present

## 2019-08-13 DIAGNOSIS — M5432 Sciatica, left side: Secondary | ICD-10-CM | POA: Diagnosis not present

## 2019-08-13 DIAGNOSIS — I051 Rheumatic mitral insufficiency: Secondary | ICD-10-CM | POA: Diagnosis not present

## 2019-08-13 DIAGNOSIS — G4700 Insomnia, unspecified: Secondary | ICD-10-CM | POA: Diagnosis not present

## 2019-08-13 DIAGNOSIS — Z7902 Long term (current) use of antithrombotics/antiplatelets: Secondary | ICD-10-CM | POA: Diagnosis not present

## 2019-08-13 DIAGNOSIS — G8929 Other chronic pain: Secondary | ICD-10-CM | POA: Diagnosis not present

## 2019-08-13 DIAGNOSIS — M1990 Unspecified osteoarthritis, unspecified site: Secondary | ICD-10-CM | POA: Diagnosis not present

## 2019-08-13 DIAGNOSIS — D509 Iron deficiency anemia, unspecified: Secondary | ICD-10-CM | POA: Diagnosis not present

## 2019-08-13 DIAGNOSIS — I2510 Atherosclerotic heart disease of native coronary artery without angina pectoris: Secondary | ICD-10-CM | POA: Diagnosis not present

## 2019-08-13 DIAGNOSIS — K5900 Constipation, unspecified: Secondary | ICD-10-CM | POA: Diagnosis not present

## 2019-08-13 DIAGNOSIS — E1151 Type 2 diabetes mellitus with diabetic peripheral angiopathy without gangrene: Secondary | ICD-10-CM | POA: Diagnosis not present

## 2019-08-13 DIAGNOSIS — K219 Gastro-esophageal reflux disease without esophagitis: Secondary | ICD-10-CM | POA: Diagnosis not present

## 2019-08-13 DIAGNOSIS — H409 Unspecified glaucoma: Secondary | ICD-10-CM | POA: Diagnosis not present

## 2019-08-13 DIAGNOSIS — M545 Low back pain: Secondary | ICD-10-CM | POA: Diagnosis not present

## 2019-08-13 DIAGNOSIS — E781 Pure hyperglyceridemia: Secondary | ICD-10-CM | POA: Diagnosis not present

## 2019-08-13 DIAGNOSIS — M5412 Radiculopathy, cervical region: Secondary | ICD-10-CM | POA: Diagnosis not present

## 2019-08-13 DIAGNOSIS — K279 Peptic ulcer, site unspecified, unspecified as acute or chronic, without hemorrhage or perforation: Secondary | ICD-10-CM | POA: Diagnosis not present

## 2019-08-13 DIAGNOSIS — I700 Atherosclerosis of aorta: Secondary | ICD-10-CM | POA: Diagnosis not present

## 2019-08-13 DIAGNOSIS — M810 Age-related osteoporosis without current pathological fracture: Secondary | ICD-10-CM | POA: Diagnosis not present

## 2019-08-13 DIAGNOSIS — I120 Hypertensive chronic kidney disease with stage 5 chronic kidney disease or end stage renal disease: Secondary | ICD-10-CM | POA: Diagnosis not present

## 2019-08-13 DIAGNOSIS — D519 Vitamin B12 deficiency anemia, unspecified: Secondary | ICD-10-CM | POA: Diagnosis not present

## 2019-08-13 DIAGNOSIS — N186 End stage renal disease: Secondary | ICD-10-CM | POA: Diagnosis not present

## 2019-08-13 DIAGNOSIS — E1122 Type 2 diabetes mellitus with diabetic chronic kidney disease: Secondary | ICD-10-CM | POA: Diagnosis not present

## 2019-08-14 DIAGNOSIS — G8929 Other chronic pain: Secondary | ICD-10-CM | POA: Diagnosis not present

## 2019-08-14 DIAGNOSIS — I6529 Occlusion and stenosis of unspecified carotid artery: Secondary | ICD-10-CM | POA: Diagnosis not present

## 2019-08-14 DIAGNOSIS — E1122 Type 2 diabetes mellitus with diabetic chronic kidney disease: Secondary | ICD-10-CM | POA: Diagnosis not present

## 2019-08-14 DIAGNOSIS — M1990 Unspecified osteoarthritis, unspecified site: Secondary | ICD-10-CM | POA: Diagnosis not present

## 2019-08-14 DIAGNOSIS — I120 Hypertensive chronic kidney disease with stage 5 chronic kidney disease or end stage renal disease: Secondary | ICD-10-CM | POA: Diagnosis not present

## 2019-08-15 DIAGNOSIS — I700 Atherosclerosis of aorta: Secondary | ICD-10-CM | POA: Diagnosis not present

## 2019-08-15 DIAGNOSIS — K219 Gastro-esophageal reflux disease without esophagitis: Secondary | ICD-10-CM | POA: Diagnosis not present

## 2019-08-15 DIAGNOSIS — I120 Hypertensive chronic kidney disease with stage 5 chronic kidney disease or end stage renal disease: Secondary | ICD-10-CM | POA: Diagnosis not present

## 2019-08-15 DIAGNOSIS — M810 Age-related osteoporosis without current pathological fracture: Secondary | ICD-10-CM | POA: Diagnosis not present

## 2019-08-15 DIAGNOSIS — Z7902 Long term (current) use of antithrombotics/antiplatelets: Secondary | ICD-10-CM | POA: Diagnosis not present

## 2019-08-15 DIAGNOSIS — G8929 Other chronic pain: Secondary | ICD-10-CM | POA: Diagnosis not present

## 2019-08-15 DIAGNOSIS — H409 Unspecified glaucoma: Secondary | ICD-10-CM | POA: Diagnosis not present

## 2019-08-15 DIAGNOSIS — E781 Pure hyperglyceridemia: Secondary | ICD-10-CM | POA: Diagnosis not present

## 2019-08-15 DIAGNOSIS — G4700 Insomnia, unspecified: Secondary | ICD-10-CM | POA: Diagnosis not present

## 2019-08-15 DIAGNOSIS — N186 End stage renal disease: Secondary | ICD-10-CM | POA: Diagnosis not present

## 2019-08-15 DIAGNOSIS — I051 Rheumatic mitral insufficiency: Secondary | ICD-10-CM | POA: Diagnosis not present

## 2019-08-15 DIAGNOSIS — E1151 Type 2 diabetes mellitus with diabetic peripheral angiopathy without gangrene: Secondary | ICD-10-CM | POA: Diagnosis not present

## 2019-08-15 DIAGNOSIS — D519 Vitamin B12 deficiency anemia, unspecified: Secondary | ICD-10-CM | POA: Diagnosis not present

## 2019-08-15 DIAGNOSIS — E1122 Type 2 diabetes mellitus with diabetic chronic kidney disease: Secondary | ICD-10-CM | POA: Diagnosis not present

## 2019-08-15 DIAGNOSIS — M1990 Unspecified osteoarthritis, unspecified site: Secondary | ICD-10-CM | POA: Diagnosis not present

## 2019-08-15 DIAGNOSIS — I2510 Atherosclerotic heart disease of native coronary artery without angina pectoris: Secondary | ICD-10-CM | POA: Diagnosis not present

## 2019-08-15 DIAGNOSIS — M25511 Pain in right shoulder: Secondary | ICD-10-CM | POA: Diagnosis not present

## 2019-08-15 DIAGNOSIS — D509 Iron deficiency anemia, unspecified: Secondary | ICD-10-CM | POA: Diagnosis not present

## 2019-08-15 DIAGNOSIS — K279 Peptic ulcer, site unspecified, unspecified as acute or chronic, without hemorrhage or perforation: Secondary | ICD-10-CM | POA: Diagnosis not present

## 2019-08-15 DIAGNOSIS — M5412 Radiculopathy, cervical region: Secondary | ICD-10-CM | POA: Diagnosis not present

## 2019-08-15 DIAGNOSIS — I6529 Occlusion and stenosis of unspecified carotid artery: Secondary | ICD-10-CM | POA: Diagnosis not present

## 2019-08-15 DIAGNOSIS — M5432 Sciatica, left side: Secondary | ICD-10-CM | POA: Diagnosis not present

## 2019-08-15 DIAGNOSIS — M545 Low back pain: Secondary | ICD-10-CM | POA: Diagnosis not present

## 2019-08-15 DIAGNOSIS — K5900 Constipation, unspecified: Secondary | ICD-10-CM | POA: Diagnosis not present

## 2019-08-16 ENCOUNTER — Encounter (HOSPITAL_COMMUNITY): Payer: Medicare Other

## 2019-08-19 DIAGNOSIS — M5412 Radiculopathy, cervical region: Secondary | ICD-10-CM | POA: Diagnosis not present

## 2019-08-19 DIAGNOSIS — E1122 Type 2 diabetes mellitus with diabetic chronic kidney disease: Secondary | ICD-10-CM | POA: Diagnosis not present

## 2019-08-19 DIAGNOSIS — I2510 Atherosclerotic heart disease of native coronary artery without angina pectoris: Secondary | ICD-10-CM | POA: Diagnosis not present

## 2019-08-19 DIAGNOSIS — M810 Age-related osteoporosis without current pathological fracture: Secondary | ICD-10-CM | POA: Diagnosis not present

## 2019-08-19 DIAGNOSIS — G4700 Insomnia, unspecified: Secondary | ICD-10-CM | POA: Diagnosis not present

## 2019-08-19 DIAGNOSIS — I120 Hypertensive chronic kidney disease with stage 5 chronic kidney disease or end stage renal disease: Secondary | ICD-10-CM | POA: Diagnosis not present

## 2019-08-19 DIAGNOSIS — D509 Iron deficiency anemia, unspecified: Secondary | ICD-10-CM | POA: Diagnosis not present

## 2019-08-19 DIAGNOSIS — Z7902 Long term (current) use of antithrombotics/antiplatelets: Secondary | ICD-10-CM | POA: Diagnosis not present

## 2019-08-19 DIAGNOSIS — I6529 Occlusion and stenosis of unspecified carotid artery: Secondary | ICD-10-CM | POA: Diagnosis not present

## 2019-08-19 DIAGNOSIS — M25511 Pain in right shoulder: Secondary | ICD-10-CM | POA: Diagnosis not present

## 2019-08-19 DIAGNOSIS — I700 Atherosclerosis of aorta: Secondary | ICD-10-CM | POA: Diagnosis not present

## 2019-08-19 DIAGNOSIS — K219 Gastro-esophageal reflux disease without esophagitis: Secondary | ICD-10-CM | POA: Diagnosis not present

## 2019-08-19 DIAGNOSIS — K5900 Constipation, unspecified: Secondary | ICD-10-CM | POA: Diagnosis not present

## 2019-08-19 DIAGNOSIS — M1990 Unspecified osteoarthritis, unspecified site: Secondary | ICD-10-CM | POA: Diagnosis not present

## 2019-08-19 DIAGNOSIS — D519 Vitamin B12 deficiency anemia, unspecified: Secondary | ICD-10-CM | POA: Diagnosis not present

## 2019-08-19 DIAGNOSIS — G8929 Other chronic pain: Secondary | ICD-10-CM | POA: Diagnosis not present

## 2019-08-19 DIAGNOSIS — K279 Peptic ulcer, site unspecified, unspecified as acute or chronic, without hemorrhage or perforation: Secondary | ICD-10-CM | POA: Diagnosis not present

## 2019-08-19 DIAGNOSIS — M545 Low back pain: Secondary | ICD-10-CM | POA: Diagnosis not present

## 2019-08-19 DIAGNOSIS — E781 Pure hyperglyceridemia: Secondary | ICD-10-CM | POA: Diagnosis not present

## 2019-08-19 DIAGNOSIS — H409 Unspecified glaucoma: Secondary | ICD-10-CM | POA: Diagnosis not present

## 2019-08-19 DIAGNOSIS — I051 Rheumatic mitral insufficiency: Secondary | ICD-10-CM | POA: Diagnosis not present

## 2019-08-19 DIAGNOSIS — E1151 Type 2 diabetes mellitus with diabetic peripheral angiopathy without gangrene: Secondary | ICD-10-CM | POA: Diagnosis not present

## 2019-08-19 DIAGNOSIS — M5432 Sciatica, left side: Secondary | ICD-10-CM | POA: Diagnosis not present

## 2019-08-19 DIAGNOSIS — N186 End stage renal disease: Secondary | ICD-10-CM | POA: Diagnosis not present

## 2019-08-20 DIAGNOSIS — M1711 Unilateral primary osteoarthritis, right knee: Secondary | ICD-10-CM | POA: Diagnosis not present

## 2019-08-20 DIAGNOSIS — M25561 Pain in right knee: Secondary | ICD-10-CM | POA: Diagnosis not present

## 2019-08-22 DIAGNOSIS — N186 End stage renal disease: Secondary | ICD-10-CM | POA: Diagnosis not present

## 2019-08-22 DIAGNOSIS — M1990 Unspecified osteoarthritis, unspecified site: Secondary | ICD-10-CM | POA: Diagnosis not present

## 2019-08-22 DIAGNOSIS — M810 Age-related osteoporosis without current pathological fracture: Secondary | ICD-10-CM | POA: Diagnosis not present

## 2019-08-22 DIAGNOSIS — G4700 Insomnia, unspecified: Secondary | ICD-10-CM | POA: Diagnosis not present

## 2019-08-22 DIAGNOSIS — I2510 Atherosclerotic heart disease of native coronary artery without angina pectoris: Secondary | ICD-10-CM | POA: Diagnosis not present

## 2019-08-22 DIAGNOSIS — G8929 Other chronic pain: Secondary | ICD-10-CM | POA: Diagnosis not present

## 2019-08-22 DIAGNOSIS — K279 Peptic ulcer, site unspecified, unspecified as acute or chronic, without hemorrhage or perforation: Secondary | ICD-10-CM | POA: Diagnosis not present

## 2019-08-22 DIAGNOSIS — K219 Gastro-esophageal reflux disease without esophagitis: Secondary | ICD-10-CM | POA: Diagnosis not present

## 2019-08-22 DIAGNOSIS — E781 Pure hyperglyceridemia: Secondary | ICD-10-CM | POA: Diagnosis not present

## 2019-08-22 DIAGNOSIS — D509 Iron deficiency anemia, unspecified: Secondary | ICD-10-CM | POA: Diagnosis not present

## 2019-08-22 DIAGNOSIS — M5412 Radiculopathy, cervical region: Secondary | ICD-10-CM | POA: Diagnosis not present

## 2019-08-22 DIAGNOSIS — K5900 Constipation, unspecified: Secondary | ICD-10-CM | POA: Diagnosis not present

## 2019-08-22 DIAGNOSIS — M25511 Pain in right shoulder: Secondary | ICD-10-CM | POA: Diagnosis not present

## 2019-08-22 DIAGNOSIS — H409 Unspecified glaucoma: Secondary | ICD-10-CM | POA: Diagnosis not present

## 2019-08-22 DIAGNOSIS — I051 Rheumatic mitral insufficiency: Secondary | ICD-10-CM | POA: Diagnosis not present

## 2019-08-22 DIAGNOSIS — M5432 Sciatica, left side: Secondary | ICD-10-CM | POA: Diagnosis not present

## 2019-08-22 DIAGNOSIS — I120 Hypertensive chronic kidney disease with stage 5 chronic kidney disease or end stage renal disease: Secondary | ICD-10-CM | POA: Diagnosis not present

## 2019-08-22 DIAGNOSIS — E1151 Type 2 diabetes mellitus with diabetic peripheral angiopathy without gangrene: Secondary | ICD-10-CM | POA: Diagnosis not present

## 2019-08-22 DIAGNOSIS — I700 Atherosclerosis of aorta: Secondary | ICD-10-CM | POA: Diagnosis not present

## 2019-08-22 DIAGNOSIS — D519 Vitamin B12 deficiency anemia, unspecified: Secondary | ICD-10-CM | POA: Diagnosis not present

## 2019-08-22 DIAGNOSIS — Z7902 Long term (current) use of antithrombotics/antiplatelets: Secondary | ICD-10-CM | POA: Diagnosis not present

## 2019-08-22 DIAGNOSIS — M545 Low back pain: Secondary | ICD-10-CM | POA: Diagnosis not present

## 2019-08-22 DIAGNOSIS — E1122 Type 2 diabetes mellitus with diabetic chronic kidney disease: Secondary | ICD-10-CM | POA: Diagnosis not present

## 2019-08-22 DIAGNOSIS — I6529 Occlusion and stenosis of unspecified carotid artery: Secondary | ICD-10-CM | POA: Diagnosis not present

## 2019-08-27 ENCOUNTER — Other Ambulatory Visit: Payer: Self-pay | Admitting: Internal Medicine

## 2019-09-04 ENCOUNTER — Other Ambulatory Visit: Payer: Self-pay

## 2019-09-04 ENCOUNTER — Ambulatory Visit (HOSPITAL_COMMUNITY)
Admission: RE | Admit: 2019-09-04 | Discharge: 2019-09-04 | Disposition: A | Discharge status: Home / Self Care | Payer: Medicare Other | Source: Ambulatory Visit | Attending: Nephrology | Admitting: Nephrology

## 2019-09-04 VITALS — BP 150/77 | HR 69 | Temp 97.2°F | Resp 20

## 2019-09-04 DIAGNOSIS — D539 Nutritional anemia, unspecified: Secondary | ICD-10-CM | POA: Insufficient documentation

## 2019-09-04 LAB — IRON AND TIBC
Iron: 66 ug/dL (ref 28–170)
Saturation Ratios: 25 % (ref 10.4–31.8)
TIBC: 266 ug/dL (ref 250–450)
UIBC: 200 ug/dL

## 2019-09-04 LAB — FERRITIN: Ferritin: 816 ng/mL — ABNORMAL HIGH (ref 11–307)

## 2019-09-04 LAB — POCT HEMOGLOBIN-HEMACUE: Hemoglobin: 12.3 g/dL (ref 12.0–15.0)

## 2019-09-04 MED ORDER — EPOETIN ALFA 10000 UNIT/ML IJ SOLN: 10000.0000 [IU] | INTRAMUSCULAR | Status: DC

## 2019-09-04 MED ORDER — EPOETIN ALFA 10000 UNIT/ML IJ SOLN
INTRAMUSCULAR | Status: AC
  Filled 2019-09-04: qty 1

## 2019-09-05 ENCOUNTER — Other Ambulatory Visit: Payer: Self-pay | Admitting: *Deleted

## 2019-09-05 NOTE — Patient Outreach (Signed)
[facility]) Care Management  09/05/2019  JAELYNN POZO [birth year] [id number]    Telephone Assessment-Successful (Diabetes)  RN spoke with pt today and received an update on pt's ongoing management of care. Pt reports she is doing well with no additoinal [facility] involved however pt continue to perform the exercises and ambulating more. Pt has verified the [facility] agency provider her with the compression stockings (large/tall). States her swelling has reduce and she continues to wear the stockings during the day. Pt strongly daily use to reduce swelling however remove the stockings upon bedtime and reapply upon arising after in the morning while ambulating. Pt is aware of the protocol for wearing TEDS. RN re-educated on the purpose and importance of using the thrombotic stockings. Nutritionally pt reports she is eating more vegetables.  Pt able to recite foods items to avoid such as starchy foods (white rice, breads and potatoes). RN discuss other health food items and encouraged pt to review the educational packet sent via [facility] on diabetes nutritions. Pt has made this change in her life style with incorporating exercise and exchanging her food items to a more health alterative. Other topics discussed today related to the plan of care. Pt reports her CBG have reduced to 127-178. Last month pt's reported her CBG were 180-200. Pt praise pt for all her efforts in reducing her CBG and changing her routine and diet to manager her diabetes. Will make adjustments to her interventions based upon her progress and continue to encourage pt with her long and shot goals. Will re-evaluate next month and continue to offer support and resources to assist pt accordingly in meeting her goals.  THN CM Care Plan Problem One     Most Recent Value  Care Plan Problem One  Knowledge Deficit of diabetes control related to A1c-8.1%  Role Documenting the Problem One  Care Management [place]  for Problem One  Active  THN Long Term Goal   Pt will report a reduction in A1c 1-2 points in the next 90 days.  THN Long Term Goal Start Date  07/11/19  Interventions for Problem One Long Term Goal  Will continue to educate on the importance of managing her diabetes and lower her A1C with the interventions and tools provider. Will re-evaluate this goal next month on pt's progress and changes to reduce this reading, Will also verify pt it aware of the importance of reducing this reading by 1-2 pts.  THN CM Short Term Goal #1   Pt will be able to recognize a healthy food items related to modification to create an appropriate diabetic diet,  THN CM Short Term Goal #1 Start Date  07/11/19  Interventions for Short Term Goal #1  Will reivew mailed educational material and continue to educate on healthy eating food items for a diabetic. Will strongly encouraged pt to continue to incorporate more proteins and veggies to her daily intake and avoid starchy items  that can affect her over all CBG and A1c.  THN CM Short Term Goal #2   Bilateral swelling to LE will reduce within the next 30 days.  THN CM Short Term Goal #2 Start Date  07/11/19  Interventions for Short Term Goal #2  Will continue to encourage pt to wear her compression stockings and elevate her bilateral LE to reduce ongoing swelling,      Raina Mina, RN Care Management Coordinator [place] Office [phone]

## 2019-09-09 ENCOUNTER — Other Ambulatory Visit: Payer: Self-pay | Admitting: Internal Medicine

## 2019-09-09 DIAGNOSIS — D631 Anemia in chronic kidney disease: Secondary | ICD-10-CM | POA: Diagnosis not present

## 2019-09-09 DIAGNOSIS — N189 Chronic kidney disease, unspecified: Secondary | ICD-10-CM | POA: Diagnosis not present

## 2019-09-09 DIAGNOSIS — N2581 Secondary hyperparathyroidism of renal origin: Secondary | ICD-10-CM | POA: Diagnosis not present

## 2019-09-09 DIAGNOSIS — N2889 Other specified disorders of kidney and ureter: Secondary | ICD-10-CM | POA: Diagnosis not present

## 2019-09-09 DIAGNOSIS — Z1159 Encounter for screening for other viral diseases: Secondary | ICD-10-CM | POA: Diagnosis not present

## 2019-09-09 DIAGNOSIS — N184 Chronic kidney disease, stage 4 (severe): Secondary | ICD-10-CM | POA: Diagnosis not present

## 2019-09-09 DIAGNOSIS — E1122 Type 2 diabetes mellitus with diabetic chronic kidney disease: Secondary | ICD-10-CM | POA: Diagnosis not present

## 2019-09-12 DIAGNOSIS — H401134 Primary open-angle glaucoma, bilateral, indeterminate stage: Secondary | ICD-10-CM | POA: Diagnosis not present

## 2019-09-12 DIAGNOSIS — Z794 Long term (current) use of insulin: Secondary | ICD-10-CM | POA: Diagnosis not present

## 2019-09-12 DIAGNOSIS — E119 Type 2 diabetes mellitus without complications: Secondary | ICD-10-CM | POA: Diagnosis not present

## 2019-09-12 DIAGNOSIS — Z961 Presence of intraocular lens: Secondary | ICD-10-CM | POA: Diagnosis not present

## 2019-09-12 LAB — HM DIABETES EYE EXAM

## 2019-09-15 ENCOUNTER — Other Ambulatory Visit: Payer: Self-pay | Admitting: Internal Medicine

## 2019-09-20 ENCOUNTER — Other Ambulatory Visit: Payer: Self-pay

## 2019-09-20 ENCOUNTER — Encounter (HOSPITAL_COMMUNITY): Payer: Medicare Other

## 2019-09-20 ENCOUNTER — Ambulatory Visit (HOSPITAL_COMMUNITY)
Admission: RE | Admit: 2019-09-20 | Discharge: 2019-09-20 | Disposition: A | Discharge status: Home / Self Care | Payer: Medicare Other | Source: Ambulatory Visit | Attending: Nephrology | Admitting: Nephrology

## 2019-09-20 VITALS — BP 117/78 | HR 93 | Temp 98.1°F | Resp 20

## 2019-09-20 DIAGNOSIS — D539 Nutritional anemia, unspecified: Secondary | ICD-10-CM | POA: Diagnosis not present

## 2019-09-20 LAB — POCT HEMOGLOBIN-HEMACUE: Hemoglobin: 11.4 g/dL — ABNORMAL LOW (ref 12.0–15.0)

## 2019-09-20 MED ORDER — EPOETIN ALFA 10000 UNIT/ML IJ SOLN
INTRAMUSCULAR | Status: AC
  Filled 2019-09-20: qty 1

## 2019-09-20 MED ORDER — EPOETIN ALFA 10000 UNIT/ML IJ SOLN
10000.0000 [IU] | INTRAMUSCULAR | Status: DC
  Administered 2019-09-20: 10000 [IU] via SUBCUTANEOUS

## 2019-09-22 ENCOUNTER — Ambulatory Visit
Admission: RE | Admit: 2019-09-22 | Discharge: 2019-09-22 | Disposition: A | Discharge status: Home / Self Care | Payer: Medicare Other | Source: Ambulatory Visit | Attending: Internal Medicine | Admitting: Internal Medicine

## 2019-09-22 ENCOUNTER — Other Ambulatory Visit: Payer: Self-pay

## 2019-09-22 DIAGNOSIS — M5412 Radiculopathy, cervical region: Secondary | ICD-10-CM

## 2019-09-22 DIAGNOSIS — M4802 Spinal stenosis, cervical region: Secondary | ICD-10-CM | POA: Diagnosis not present

## 2019-09-23 ENCOUNTER — Other Ambulatory Visit: Payer: Self-pay | Admitting: Internal Medicine

## 2019-09-23 DIAGNOSIS — M5412 Radiculopathy, cervical region: Secondary | ICD-10-CM

## 2019-09-27 ENCOUNTER — Other Ambulatory Visit: Payer: Self-pay

## 2019-09-27 DIAGNOSIS — I779 Disorder of arteries and arterioles, unspecified: Secondary | ICD-10-CM

## 2019-09-27 DIAGNOSIS — L98499 Non-pressure chronic ulcer of skin of other sites with unspecified severity: Secondary | ICD-10-CM

## 2019-09-27 DIAGNOSIS — I771 Stricture of artery: Secondary | ICD-10-CM

## 2019-10-01 ENCOUNTER — Ambulatory Visit: Payer: Medicare Other | Admitting: Podiatry

## 2019-10-02 ENCOUNTER — Inpatient Hospital Stay (HOSPITAL_COMMUNITY): Admission: RE | Admit: 2019-10-02 | Payer: Medicare Other | Source: Ambulatory Visit

## 2019-10-02 ENCOUNTER — Encounter (HOSPITAL_COMMUNITY): Payer: Medicare Other

## 2019-10-02 ENCOUNTER — Other Ambulatory Visit: Payer: Self-pay | Admitting: *Deleted

## 2019-10-02 ENCOUNTER — Ambulatory Visit: Payer: Medicare Other | Admitting: Family

## 2019-10-02 NOTE — Patient Outreach (Signed)
[facility]) Care Management  10/02/2019  [name] 1945-10-30 [id number]    Telephone Assessment-Successful-DM  RN spoke with pt today and received an update on pt's ongoing management of care. Pt reports a home visit on yesterday with an A1c drawn at 10.1 increased from Sept with an 8.1. Pt reports her AM readings around 170. RN stress the importance of recognizing what and when you snacks at night and what would be healthy for a bedtime snack. Also discuss the consumption of small meals throughout the day to assist with controlling her readings. RN discussed the risk involved if this condition is not monitored better with these adapted changes. Discussed possible changes with her diet with the modifications and offered a dietitian consult if necessary to help pt achieve these goals.  Pt opt to decline the dietitian and feels she is able to make such changes to improve her overall A1c over the next 3 months. RN explained and further educated on if her levels continue to increase her medication would be adjusted accordingly resulting in possible effects on her kidneys.  RN further reviewed the material sent to pt several months ago concerning dietary habits related to diabetes. Stress the importance of avoiding carbohydrates, increase her proteins and why this is important. Rn also inquired on other issues from the last assessment related to swelling. Pt states this is much better and she is not wearing her compression and continue to elevate her LE.   Plan of care discussed in detail with updated interventions. RN offered any additional tools that would assist pt with her ongoing goals. Will again stress the importance of adhering to the goals of care in place at this time.  THN CM Care Plan Problem One     Most Recent Value  Care Plan Problem One  Knowledge Deficit of diabetes control related to A1c-8.1%  Role Documenting the Problem One  Care Management [place] for Problem One  Active  THN Long Term Goal   Pt will report a reduction in A1c 1-2 points in the next 90 days.  THN Long Term Goal Start Date  07/11/19  Interventions for Problem One Long Term Goal  Will educat pt on the improtance of reducing her A1c based upon the most recent reading at 10.1 on yesterday. Pt verbalized an understanding that this is an increased from her last readings. Educated on the risk involved if pt does not manage his condition. Will further discuss detail in the clinical note.  Will continue to extend to alllow ongoing adherence with managing this task.  THN CM Short Term Goal #1   Pt will be able to recognize a healthy food items related to modification to create an appropriate diabetic diet,  THN CM Short Term Goal #1 Start Date  07/11/19  Interventions for Short Term Goal #1  Will continue to stress the importance of sonsuming a diabetic friendly diet with high protein and low carbs. Will discuss a modified diet for diabetic and offere a consult.  THN CM Short Term Goal #2   Bilateral swelling to LE will reduce within the next 30 days.  THN CM Short Term Goal #2 Start Date  07/11/19  [facility] CM Short Term Goal #2 Met Date  10/02/19      Ashley Mina, RN Care Management Coordinator [place] Office [phone]

## 2019-10-03 ENCOUNTER — Other Ambulatory Visit: Payer: Self-pay

## 2019-10-03 DIAGNOSIS — Z20822 Contact with and (suspected) exposure to covid-19: Secondary | ICD-10-CM

## 2019-10-04 ENCOUNTER — Other Ambulatory Visit: Payer: Self-pay | Admitting: Internal Medicine

## 2019-10-05 LAB — NOVEL CORONAVIRUS, NAA: SARS-CoV-2, NAA: NOT DETECTED

## 2019-10-07 ENCOUNTER — Other Ambulatory Visit: Payer: Self-pay | Admitting: Internal Medicine

## 2019-10-09 ENCOUNTER — Encounter (HOSPITAL_COMMUNITY): Payer: Medicare Other

## 2019-10-12 ENCOUNTER — Other Ambulatory Visit: Payer: Self-pay | Admitting: Internal Medicine

## 2019-10-14 ENCOUNTER — Other Ambulatory Visit: Payer: Self-pay | Admitting: Internal Medicine

## 2019-10-15 DIAGNOSIS — R350 Frequency of micturition: Secondary | ICD-10-CM | POA: Diagnosis not present

## 2019-10-24 ENCOUNTER — Other Ambulatory Visit: Payer: Self-pay | Admitting: Internal Medicine

## 2019-10-24 DIAGNOSIS — M1711 Unilateral primary osteoarthritis, right knee: Secondary | ICD-10-CM | POA: Diagnosis not present

## 2019-10-24 DIAGNOSIS — R6889 Other general symptoms and signs: Secondary | ICD-10-CM | POA: Diagnosis not present

## 2019-10-24 DIAGNOSIS — M25561 Pain in right knee: Secondary | ICD-10-CM | POA: Diagnosis not present

## 2019-10-24 DIAGNOSIS — M542 Cervicalgia: Secondary | ICD-10-CM | POA: Diagnosis not present

## 2019-10-28 ENCOUNTER — Encounter (HOSPITAL_COMMUNITY)
Admission: RE | Admit: 2019-10-28 | Discharge: 2019-10-28 | Disposition: A | Discharge status: Home / Self Care | Payer: Medicare Other | Source: Ambulatory Visit | Attending: Nephrology | Admitting: Nephrology

## 2019-10-28 ENCOUNTER — Other Ambulatory Visit: Payer: Self-pay

## 2019-10-28 VITALS — BP 152/81 | HR 76 | Temp 96.2°F | Resp 20

## 2019-10-28 DIAGNOSIS — D539 Nutritional anemia, unspecified: Secondary | ICD-10-CM | POA: Insufficient documentation

## 2019-10-28 LAB — POCT HEMOGLOBIN-HEMACUE: Hemoglobin: 12.8 g/dL (ref 12.0–15.0)

## 2019-10-28 MED ORDER — EPOETIN ALFA 10000 UNIT/ML IJ SOLN
10000.0000 [IU] | INTRAMUSCULAR | Status: DC
Start: 2019-10-28 — End: 2019-10-29

## 2019-11-03 ENCOUNTER — Other Ambulatory Visit: Payer: Self-pay | Admitting: Internal Medicine

## 2019-11-11 ENCOUNTER — Encounter (HOSPITAL_COMMUNITY): Payer: Medicare Other

## 2019-11-14 ENCOUNTER — Other Ambulatory Visit: Payer: Self-pay | Admitting: Internal Medicine

## 2019-11-26 ENCOUNTER — Encounter: Payer: Self-pay | Admitting: *Deleted

## 2019-11-27 ENCOUNTER — Other Ambulatory Visit: Payer: Self-pay | Admitting: Internal Medicine

## 2019-12-02 ENCOUNTER — Encounter (HOSPITAL_COMMUNITY): Payer: Medicare Other

## 2019-12-05 ENCOUNTER — Encounter (HOSPITAL_COMMUNITY): Payer: Medicare Other

## 2019-12-13 ENCOUNTER — Other Ambulatory Visit: Payer: Self-pay | Admitting: *Deleted

## 2019-12-13 MED ORDER — TIZANIDINE HCL 4 MG PO TABS: ORAL_TABLET | ORAL | 0 refills | Status: DC

## 2019-12-14 ENCOUNTER — Ambulatory Visit: Payer: Medicare Other

## 2019-12-15 ENCOUNTER — Ambulatory Visit: Payer: Medicare Other | Attending: Internal Medicine

## 2019-12-15 DIAGNOSIS — Z23 Encounter for immunization: Secondary | ICD-10-CM

## 2019-12-15 NOTE — Progress Notes (Signed)
   Covid-19 Vaccination Clinic  Name:  [name]    MRN: [id number] DOB: 10-19-1946  12/15/2019  [name] was observed post Covid-19 immunization for 15 minutes without incidence. She was provided with Vaccine Information Sheet and instruction to access the V-Safe system.   [name] was instructed to call 911 with any severe reactions post vaccine: Marland Kitchen Difficulty breathing  . Swelling of your face and throat  . A fast heartbeat  . A bad rash all over your body  . Dizziness and weakness    Immunizations Administered    Name Date Dose VIS Date Route   Pfizer COVID-19 Vaccine 12/15/2019  2:54 PM 0.3 mL 10/04/2019 Intramuscular   Manufacturer: [place]   Lot: [id number]   [place]: [id number]

## 2019-12-16 ENCOUNTER — Encounter (HOSPITAL_COMMUNITY): Payer: Medicare Other

## 2019-12-16 ENCOUNTER — Other Ambulatory Visit: Payer: Self-pay | Admitting: Internal Medicine

## 2019-12-16 NOTE — Telephone Encounter (Signed)
Done erx 

## 2019-12-20 ENCOUNTER — Ambulatory Visit: Payer: Medicare Other | Admitting: Podiatry

## 2019-12-20 ENCOUNTER — Encounter: Payer: Self-pay | Admitting: Podiatry

## 2019-12-20 ENCOUNTER — Other Ambulatory Visit: Payer: Self-pay

## 2019-12-20 DIAGNOSIS — B351 Tinea unguium: Secondary | ICD-10-CM

## 2019-12-20 DIAGNOSIS — E1151 Type 2 diabetes mellitus with diabetic peripheral angiopathy without gangrene: Secondary | ICD-10-CM

## 2019-12-20 NOTE — Patient Instructions (Signed)

## 2019-12-21 NOTE — Progress Notes (Signed)
Subjective: [name] presents today for follow up of preventative diabetic foot care and for at risk foot care. Patient has h/o PAD. Marland Kitchen   No Known Allergies   Objective: There were no vitals filed for this visit.  Vascular Examination:  Capillary refill time to digits immediate b/l, faintly palpable pedal pulses b/l, pedal hair absent b/l and skin temperature gradient within normal limits b/l  Dermatological Examination: Pedal skin is thin shiny, atrophic bilaterally, no open wounds bilaterally, no interdigital macerations bilaterally and toenails 2-5 b/l elongated, dystrophic, thickened, crumbly with subungual debris.  Anonychia b/l great toe(s) with evidence of permanent total nail avulsion. Nailbed(s) completely epithelialized and intact.  Musculoskeletal: Normal muscle strength 5/5 to all lower extremity muscle groups bilaterally, no pain crepitus or joint limitation noted with ROM b/l and hammertoes noted to the  2-5 bilaterally  Neurological: Protective sensation intact 5/5 intact bilaterally with 10g monofilament b/l  Assessment: 1. Onychomycosis   2. Type II diabetes mellitus with peripheral circulatory disorder (HCC)    Plan: Continue diabetic foot care principles daily. Literature dispensed today.  Toenails 2-5 debrided in length and girth without iatrogenic laceration. Continue soft, supportive shoe gear daily. See medical professional for any pedal injuries immediately. Patient/POA to call should there be a concern in the interim. Return in about 9 weeks (around 02/21/2020) for diabetic nail trim/ Plavix.

## 2019-12-23 ENCOUNTER — Other Ambulatory Visit: Payer: Self-pay

## 2019-12-23 ENCOUNTER — Encounter: Payer: Self-pay | Admitting: Internal Medicine

## 2019-12-23 ENCOUNTER — Ambulatory Visit (INDEPENDENT_AMBULATORY_CARE_PROVIDER_SITE_OTHER): Payer: Medicare Other | Admitting: Internal Medicine

## 2019-12-23 VITALS — BP 134/82 | HR 74 | Temp 98.5°F | Ht 61.0 in | Wt 231.4 lb

## 2019-12-23 DIAGNOSIS — E118 Type 2 diabetes mellitus with unspecified complications: Secondary | ICD-10-CM

## 2019-12-23 DIAGNOSIS — E785 Hyperlipidemia, unspecified: Secondary | ICD-10-CM | POA: Diagnosis not present

## 2019-12-23 DIAGNOSIS — I739 Peripheral vascular disease, unspecified: Secondary | ICD-10-CM

## 2019-12-23 DIAGNOSIS — I10 Essential (primary) hypertension: Secondary | ICD-10-CM

## 2019-12-23 DIAGNOSIS — E11622 Type 2 diabetes mellitus with other skin ulcer: Secondary | ICD-10-CM

## 2019-12-23 DIAGNOSIS — N185 Chronic kidney disease, stage 5: Secondary | ICD-10-CM

## 2019-12-23 DIAGNOSIS — L97909 Non-pressure chronic ulcer of unspecified part of unspecified lower leg with unspecified severity: Secondary | ICD-10-CM

## 2019-12-23 DIAGNOSIS — E1022 Type 1 diabetes mellitus with diabetic chronic kidney disease: Secondary | ICD-10-CM

## 2019-12-23 LAB — HEPATIC FUNCTION PANEL
ALT: 12 U/L (ref 0–35)
AST: 13 U/L (ref 0–37)
Albumin: 3.6 g/dL (ref 3.5–5.2)
Alkaline Phosphatase: 122 U/L — ABNORMAL HIGH (ref 39–117)
Bilirubin, Direct: 0.1 mg/dL (ref 0.0–0.3)
Total Bilirubin: 0.5 mg/dL (ref 0.2–1.2)
Total Protein: 7.4 g/dL (ref 6.0–8.3)

## 2019-12-23 LAB — LIPID PANEL
Cholesterol: 168 mg/dL (ref 0–200)
HDL: 32.2 mg/dL — ABNORMAL LOW (ref >39.00)
LDL Cholesterol: 104 mg/dL — ABNORMAL HIGH (ref 0–99)
NonHDL: 135.43
Total CHOL/HDL Ratio: 5
Triglycerides: 158 mg/dL — ABNORMAL HIGH (ref 0.0–149.0)
VLDL: 31.6 mg/dL (ref 0.0–40.0)

## 2019-12-23 LAB — BASIC METABOLIC PANEL
BUN: 36 mg/dL — ABNORMAL HIGH (ref 6–23)
CO2: 27 mEq/L (ref 19–32)
Calcium: 9.7 mg/dL (ref 8.4–10.5)
Chloride: 103 mEq/L (ref 96–112)
Creatinine, Ser: 2.83 mg/dL — ABNORMAL HIGH (ref 0.40–1.20)
GFR: 19.66 mL/min — ABNORMAL LOW (ref >60.00)
Glucose, Bld: 67 mg/dL — ABNORMAL LOW (ref 70–99)
Potassium: 3.4 mEq/L — ABNORMAL LOW (ref 3.5–5.1)
Sodium: 142 mEq/L (ref 135–145)

## 2019-12-23 LAB — HEMOGLOBIN A1C: Hgb A1c MFr Bld: 7.5 % — ABNORMAL HIGH (ref 4.6–6.5)

## 2019-12-23 MED ORDER — DOXYCYCLINE HYCLATE 100 MG PO TABS: 100.0000 mg | ORAL_TABLET | Freq: Two times a day (BID) | ORAL | 0 refills | Status: DC

## 2019-12-23 MED ORDER — LANTUS SOLOSTAR 100 UNIT/ML ~~LOC~~ SOPN: 18.0000 [IU] | PEN_INJECTOR | Freq: Every day | SUBCUTANEOUS | 11 refills | Status: DC

## 2019-12-23 NOTE — Patient Instructions (Addendum)
Ok to increase the lantus to 18 units per day  Please take all new medication as prescribed - the antibiotic  You will be contacted regarding the referral for: Wound clinic  Please continue all other medications as before, and refills have been done if requested.  Please have the pharmacy call with any other refills you may need.  Please continue your efforts at being more active, low cholesterol diabetic diet, and weight control.  Please keep your appointments with your specialists as you may have planned - the Vein and Vascular, and Renal  Please go to the LAB at the blood drawing area for the tests to be done  You will be contacted by phone if any changes need to be made immediately.  Otherwise, you will receive a letter about your results with an explanation, but please check with MyChart first.  Please remember to sign up for MyChart if you have not done so, as this will be important to you in the future with finding out test results, communicating by private email, and scheduling acute appointments online when needed.  Please make an Appointment to return in 6 months, or sooner if needed

## 2019-12-23 NOTE — Assessment & Plan Note (Signed)
Mild uncontrolled., for increased lantus to 18 units per day

## 2019-12-23 NOTE — Assessment & Plan Note (Signed)
To f/u vascular surgury as planned

## 2019-12-23 NOTE — Assessment & Plan Note (Signed)
stable overall by history and exam, recent data reviewed with pt, and pt to continue medical treatment as before,  to f/u any worsening symptoms or concerns  

## 2019-12-23 NOTE — Progress Notes (Signed)
Subjective:    Patient ID: [name], female    DOB: 1946/03/29, 74 y.o.   MRN: [id number]  HPI  Here to f/u; overall doing ok,  Pt denies chest pain, increasing sob or doe, wheezing, orthopnea, PND, increased LE swelling, palpitations, dizziness or syncope.  Pt denies new neurological symptoms such as new headache, or facial or extremity weakness or numbness.  Pt denies polydipsia, polyuria, or low sugar episode.  Pt states overall good compliance with meds, mostly trying to follow appropriate diet, with wt overall stable,  but little exercise however. CBGs have been mid to upper 100s  Does now have numerous open sores after dry skin scratching now with slight redness and swelling x 1 days, without fever chills.  Has renal f/u soon Past Medical History:  Diagnosis Date  . Anemia, iron deficiency   . Aortic atherosclerosis ([place]) 11/17/2017  . Arthritis   . CAD (coronary artery disease)   . Carotid stenosis    Carotid US (02/2014):  Bilateral ICA 40-59%; > 50% L ECA; F/u 1 year  . Cataract    removed both eyes  . Cholelithiasis 11/17/2017  . Chronic kidney disease    chronic renal failure  . DM2 (diabetes mellitus, type 2) ([place])   . GERD (gastroesophageal reflux disease)   . Glaucoma   . HLD (hyperlipidemia)   . HTN (hypertension)   . Hx of cardiovascular stress test    Lexiscan Myoview (03/24/14):  No ischemia, EF 73%, Normal  . Hypoglycemia 06/27/2017  . Mitral regurgitation   . Osteopenia 07/21/2017  . Osteoporosis   . PUD (peptic ulcer disease)   . PVD (peripheral vascular disease) ([place])   . Shortness of breath    with exertion   Past Surgical History:  Procedure Laterality Date  . ABDOMINAL AORTOGRAM W/LOWER EXTREMITY N/A 08/13/2018   Procedure: ABDOMINAL AORTOGRAM W/LOWER EXTREMITY;  Surgeon: Waynetta Sandy, MD;  Location: [place] CV LAB;  Service: Cardiovascular;  Laterality: N/A;  . ABDOMINAL HYSTERECTOMY    . APPENDECTOMY    . AV FISTULA PLACEMENT  Right 01/18/2013   Procedure: ARTERIOVENOUS (AV) FISTULA CREATION;  Surgeon: Rosetta Posner, MD;  Location: [place];  Service: Vascular;  Laterality: Right;  Ultrasound guided  . COLONOSCOPY    . CORONARY ANGIOPLASTY WITH STENT PLACEMENT    . PERIPHERAL VASCULAR INTERVENTION Bilateral 08/13/2018   Procedure: PERIPHERAL VASCULAR INTERVENTION;  Surgeon: Waynetta Sandy, MD;  Location: [place] CV LAB;  Service: Cardiovascular;  Laterality: Bilateral;  . RENAL ANGIOGRAM N/A 09/16/2011   Procedure: RENAL ANGIOGRAM;  Surgeon: Sherren Mocha, MD;  Location: [facility] CATH LAB;  Service: Cardiovascular;  Laterality: N/A;    reports that she quit smoking about 12 years ago. She has never used smokeless tobacco. She reports that she does not drink alcohol or use drugs. family history includes Diabetes in her sister and son; Glaucoma in her father; Heart disease in her father. No Known Allergies Current Outpatient Medications on File Prior to Visit  Medication Sig Dispense Refill  . ACCU-CHEK AVIVA PLUS test strip USE AS INSTRUCTED THREE TIMES DAILY. DX E11.9 100 each 12  . acetaminophen (TYLENOL) 500 MG tablet Take 1-2 tablets (500-1,000 mg total) by mouth every 8 (eight) hours as needed for mild pain.    Marland Kitchen allopurinol (ZYLOPRIM) 100 MG tablet Take 100 mg by mouth daily.    Marland Kitchen amLODipine (NORVASC) 10 MG tablet TAKE 1 TABLET BY MOUTH EVERY DAY 90 tablet 1  . aspirin  81 MG tablet Take 81 mg by mouth daily.    Marland Kitchen atorvastatin (LIPITOR) 80 MG tablet TAKE 1 TABLET BY MOUTH DAILY AT 6PM 90 tablet 3  . BD PEN NEEDLE NANO U/F 32G X 4 MM MISC USE DAILY WITH LANTUS SOLASTAR 100 each 11  . blood glucose meter kit and supplies KIT Dispense based on patient and insurance preference. Use up to three times daily as directed. (FOR ICD-9 250.00, 250.01). 1 each 0  . calcitRIOL (ROCALTROL) 0.25 MCG capsule Take 0.25 mcg by mouth daily.    . calcitRIOL (ROCALTROL) 0.5 MCG capsule Take by mouth daily.    . clopidogrel  (PLAVIX) 75 MG tablet TAKE 1 TABLET BY MOUTH EVERY DAY 90 tablet 1  . Cyanocobalamin (B-12 PO) Take 1 tablet by mouth daily.    . diclofenac sodium (VOLTAREN) 1 % GEL Apply 4 g topically 4 (four) times daily as needed. 400 g 11  . ferrous sulfate 325 (65 FE) MG tablet TAKE 1 TABLET BY MOUTH TWICE A DAY 180 tablet 1  . furosemide (LASIX) 80 MG tablet Take 0.5 tablets (40 mg total) by mouth 2 (two) times daily.    Marland Kitchen gabapentin (NEURONTIN) 100 MG capsule Take 1 capsule (100 mg total) by mouth 3 (three) times daily. 90 capsule 5  . hydrOXYzine (ATARAX/VISTARIL) 10 MG tablet Take 1 tablet (10 mg total) by mouth 3 (three) times daily as needed for itching. 30 tablet 0  . insulin lispro (HUMALOG KWIKPEN) 100 UNIT/ML KwikPen INEJCT 0-9 UNITS AS DIRECTED INTO SKIN 3 TIMES A DAY 15 mL 3  . Lancets MISC Use as directed three times daily with meals 100 each 3  . metoprolol tartrate (LOPRESSOR) 100 MG tablet TAKE 1 TABLET BY MOUTH TWICE A DAY 180 tablet 2  . MYRBETRIQ 25 MG TB24 tablet Take 25 mg by mouth daily.    . nitroGLYCERIN (NITROSTAT) 0.4 MG SL tablet Place 1 tablet (0.4 mg total) under the tongue every 5 (five) minutes as needed for chest pain. Reported on 04/06/2016 25 tablet 5  . omeprazole (PRILOSEC) 20 MG capsule Take 1 capsule (20 mg total) by mouth daily. Annual appt due in Febuary must see provider for future refills 90 capsule 0  . polyethylene glycol powder (GLYCOLAX/MIRALAX) powder Take 17 gm by mouth daily 3350 g 11  . Potassium Chloride ER 20 MEQ TBCR Take 20 mEq by mouth daily.   6  . risedronate (ACTONEL) 150 MG tablet TAKE 1 TABLET (150 MG TOTAL) BY MOUTH EVERY 30 (THIRTY) DAYS. FIRST TUESDAY 3 tablet 3  . sodium bicarbonate 650 MG tablet Take 650 mg by mouth 2 (two) times daily.    . Tetrahydrozoline HCl (VISINE OP) Place 2 drops into both eyes daily as needed (DRY EYE).    Marland Kitchen thiamine (VITAMIN B-1) 50 MG tablet Take 1 tablet (50 mg total) by mouth daily. 90 tablet 1  . timolol  (TIMOPTIC) 0.5 % ophthalmic solution PLACE 1 DROP IN BOTH EYES DAILY    . tiZANidine (ZANAFLEX) 4 MG tablet TAKE 1 TABLET BY MOUTH EVERY 6 HOURS AS NEEDED FOR MUSCLE SPASMS 60 tablet 0  . traMADol (ULTRAM) 50 MG tablet TAKE 1 TABLET BY MOUTH EVERY 6 HOURS AS NEEDED 120 tablet 2  . Vitamin D, Ergocalciferol, (DRISDOL) 1.25 MG (50000 UT) CAPS capsule Take 1 capsule (50,000 Units total) by mouth every 7 (seven) days. 12 capsule 0  . zolpidem (AMBIEN) 5 MG tablet TAKE 1 TABLET (5 MG TOTAL) BY MOUTH  AT BEDTIME AS NEEDED FOR SLEEP. 30 tablet 5   No current facility-administered medications on file prior to visit.   Review of Systems All otherwise neg per pt     Objective:   Physical Exam BP 134/82   Pulse 74   Temp 98.5 F (36.9 C)   Ht '5\' 1"'$  (1.549 m)   Wt 231 lb 6.4 oz (105 kg)   SpO2 99%   BMI 43.72 kg/m  VS noted,  Constitutional: Pt appears in NAD HENT: Head: NCAT.  Right Ear: External ear normal.  Left Ear: External ear normal.  Eyes: . Pupils are equal, round, and reactive to light. Conjunctivae and EOM are normal Nose: without d/c or deformity Neck: Neck supple. Gross normal ROM Cardiovascular: Normal rate and regular rhythm.   Pulmonary/Chest: Effort normal and breath sounds without rales or wheezing.  Abd:  Soft, NT, ND, + BS, no organomegaly Neurological: Pt is alert. At baseline orientation, motor grossly intact Skin: Skin is warm. Slight red swelling and numerous small ulcerations to distal leg above the ankle, no drainage Psychiatric: Pt behavior is normal without agitation  All otherwise neg per pt   Lab Results  Component Value Date   WBC 9.9 06/20/2019   HGB 12.8 10/28/2019   HCT 36.0 06/20/2019   PLT 367.0 06/20/2019   GLUCOSE 206 (H) 06/20/2019   CHOL 188 06/20/2019   TRIG 206.0 (H) 06/20/2019   HDL 41.80 06/20/2019   LDLDIRECT 101.0 06/20/2019   LDLCALC 81 06/14/2018   ALT 14 06/20/2019   AST 13 06/20/2019   NA 139 06/20/2019   K 4.0 06/20/2019    CL 101 06/20/2019   CREATININE 2.33 (H) 06/20/2019   BUN 39 (H) 06/20/2019   CO2 26 06/20/2019   TSH 3.34 06/20/2019   HGBA1C 8.1 (H) 06/20/2019   MICROALBUR 56.5 (H) 06/20/2019      Assessment & Plan:

## 2019-12-23 NOTE — Assessment & Plan Note (Addendum)
Multiple smrall, with possible very mild cellulitis, for doxy course, refer wound clinic  I spent 33 min in preparing to see the patient by review of recent labs, imaging and procedures, obtaining and reviewing separately obtained history, communicating with the patient and family or caregiver, ordering medications, tests or procedures, and documenting clinical information in the EHR including the differential Dx, treatment, and any further evaluation and other management of diabetic leg ulcer, DM, HTN, HLD, CKD

## 2019-12-23 NOTE — Assessment & Plan Note (Signed)
For f/u renal as planned

## 2019-12-25 ENCOUNTER — Other Ambulatory Visit: Payer: Self-pay | Admitting: *Deleted

## 2019-12-25 DIAGNOSIS — I779 Disorder of arteries and arterioles, unspecified: Secondary | ICD-10-CM

## 2019-12-26 ENCOUNTER — Telehealth: Payer: Self-pay

## 2019-12-26 NOTE — Telephone Encounter (Signed)
FYI:  Patient's daughter called today to prepare [place] for patients appt with a PA on Mon 03/08.  She says patient will tell us that she hurt her leg on a water heater (Scarring 3-4 inches up from the ankle of Left leg).  In reality, both patient's daughter, [name], and patient's grandson have witnessed patient picking her scabs to the point of "oozing and bleeding".  It has become infected.  [name] says she is concerned because it the patient loses her leg, she won't be able to live with her because it will be too much to care for her.    Thurston Hole., LPN

## 2019-12-27 ENCOUNTER — Other Ambulatory Visit: Payer: Self-pay | Admitting: Internal Medicine

## 2019-12-30 ENCOUNTER — Ambulatory Visit (INDEPENDENT_AMBULATORY_CARE_PROVIDER_SITE_OTHER)
Admission: RE | Admit: 2019-12-30 | Discharge: 2019-12-30 | Disposition: A | Discharge status: Home / Self Care | Payer: Medicare Other | Source: Ambulatory Visit | Attending: Surgery | Admitting: Surgery

## 2019-12-30 ENCOUNTER — Other Ambulatory Visit: Payer: Self-pay

## 2019-12-30 ENCOUNTER — Ambulatory Visit: Payer: Medicare Other | Admitting: Physician Assistant

## 2019-12-30 ENCOUNTER — Ambulatory Visit (HOSPITAL_COMMUNITY)
Admission: RE | Admit: 2019-12-30 | Discharge: 2019-12-30 | Disposition: A | Discharge status: Home / Self Care | Payer: Medicare Other | Source: Ambulatory Visit | Attending: Surgery | Admitting: Surgery

## 2019-12-30 VITALS — BP 200/94 | HR 70 | Temp 96.8°F | Resp 16 | Ht 61.0 in | Wt 223.0 lb

## 2019-12-30 DIAGNOSIS — I779 Disorder of arteries and arterioles, unspecified: Secondary | ICD-10-CM

## 2019-12-30 MED ORDER — AMLODIPINE BESYLATE 10 MG PO TABS: 10.0000 mg | ORAL_TABLET | Freq: Every day | ORAL | 1 refills | Status: DC

## 2019-12-30 NOTE — Progress Notes (Signed)
Office Note     CC:  follow up bilateral common iliac artery stenting October 2019 Requesting Provider:  [name], MD  HPI: TAMIRAH GEORGE is a 74 y.o. ([birth year]) female who presents for routine follow-up peripheral arterial disease.  The patient uses a cane to walk.  She states she sometimes does her own shopping and will walk with minimal leg discomfort.  She denies rest pain.  She has a history of lower extremity venous insufficiency and has worn compression stockings in the past.  The pt is on a statin for cholesterol management.  The pt is on a daily aspirin.   Other AC: Plavix The pt is on calcium channel blocker, furosemide, beta-blocker for hypertension.   The pt is diabetic.  Insulin Tobacco hx: Former cigarette smoker, quit 2009   Past Medical History:  Diagnosis Date  . Anemia, iron deficiency   . Aortic atherosclerosis ([place]) 11/17/2017  . Arthritis   . CAD (coronary artery disease)   . Carotid stenosis    Carotid US (02/2014):  Bilateral ICA 40-59%; > 50% L ECA; F/u 1 year  . Cataract    removed both eyes  . Cholelithiasis 11/17/2017  . Chronic kidney disease    chronic renal failure  . DM2 (diabetes mellitus, type 2) ([place])   . GERD (gastroesophageal reflux disease)   . Glaucoma   . HLD (hyperlipidemia)   . HTN (hypertension)   . Hx of cardiovascular stress test    Lexiscan Myoview (03/24/14):  No ischemia, EF 73%, Normal  . Hypoglycemia 06/27/2017  . Mitral regurgitation   . Osteopenia 07/21/2017  . Osteoporosis   . PUD (peptic ulcer disease)   . PVD (peripheral vascular disease) ([place])   . Shortness of breath    with exertion    Past Surgical History:  Procedure Laterality Date  . ABDOMINAL AORTOGRAM W/LOWER EXTREMITY N/A 08/13/2018   Procedure: ABDOMINAL AORTOGRAM W/LOWER EXTREMITY;  Surgeon: Waynetta Sandy, MD;  Location: [facility];  Service: Cardiovascular;  Laterality: N/A;  . ABDOMINAL HYSTERECTOMY    . APPENDECTOMY    .  AV FISTULA PLACEMENT Right 01/18/2013   Procedure: ARTERIOVENOUS (AV) FISTULA CREATION;  Surgeon: Rosetta Posner, MD;  Location: [facility];  Service: Vascular;  Laterality: Right;  Ultrasound guided  . COLONOSCOPY    . CORONARY ANGIOPLASTY WITH STENT PLACEMENT    . PERIPHERAL VASCULAR INTERVENTION Bilateral 08/13/2018   Procedure: PERIPHERAL VASCULAR INTERVENTION;  Surgeon: Waynetta Sandy, MD;  Location: [facility];  Service: Cardiovascular;  Laterality: Bilateral;  . RENAL ANGIOGRAM N/A 09/16/2011   Procedure: RENAL ANGIOGRAM;  Surgeon: Sherren Mocha, MD;  Location: [facility];  Service: Cardiovascular;  Laterality: N/A;    Social History   Socioeconomic History  . Marital status: Single    Spouse name: Not on file  . Number of children: 3  . Years of education: Not on file  . Highest education level: Not on file  Occupational History  . Occupation: homemaker  Tobacco Use  . Smoking status: Former Smoker    Quit date: 10/25/2007    Years since quitting: 12.1  . Smokeless tobacco: Never Used  . Tobacco comment: Stopped 1.5 yrs ago January 2009  Substance and Sexual Activity  . Alcohol use: No  . Drug use: No  . Sexual activity: Never  Other Topics Concern  . Not on file  Social History Narrative  . Not on file   Social Determinants of Health  Financial Resource Strain:   . Difficulty of Paying Living Expenses: Not on file  Food Insecurity:   . Worried About Charity fundraiser in the Last Year: Not on file  . Ran Out of Food in the Last Year: Not on file  Transportation Needs:   . Lack of Transportation (Medical): Not on file  . Lack of Transportation (Non-Medical): Not on file  Physical Activity:   . Days of Exercise per Week: Not on file  . Minutes of Exercise per Session: Not on file  Stress:   . Feeling of Stress : Not on file  Social Connections:   . Frequency of Communication with Friends and Family: Not on file  . Frequency of Social Gatherings  with Friends and Family: Not on file  . Attends Religious Services: Not on file  . Active Member of Clubs or Organizations: Not on file  . Attends Archivist Meetings: Not on file  . Marital Status: Not on file  Intimate Partner Violence:   . Fear of Current or Ex-Partner: Not on file  . Emotionally Abused: Not on file  . Physically Abused: Not on file  . Sexually Abused: Not on file    Family History  Problem Relation Age of Onset  . Heart disease Father   . Glaucoma Father   . Diabetes Sister   . Diabetes Son   . Colon cancer Neg Hx   . Colon polyps Neg Hx   . Esophageal cancer Neg Hx   . Rectal cancer Neg Hx   . Stomach cancer Neg Hx     Current Outpatient Medications  Medication Sig Dispense Refill  . ACCU-CHEK AVIVA PLUS test strip USE AS INSTRUCTED THREE TIMES DAILY. DX E11.9 100 each 12  . acetaminophen (TYLENOL) 500 MG tablet Take 1-2 tablets (500-1,000 mg total) by mouth every 8 (eight) hours as needed for mild pain.    Marland Kitchen allopurinol (ZYLOPRIM) 100 MG tablet Take 100 mg by mouth daily.    Marland Kitchen amLODipine (NORVASC) 10 MG tablet TAKE 1 TABLET BY MOUTH EVERY DAY 90 tablet 1  . aspirin 81 MG tablet Take 81 mg by mouth daily.    Marland Kitchen atorvastatin (LIPITOR) 80 MG tablet TAKE 1 TABLET BY MOUTH DAILY AT 6PM 90 tablet 3  . BD PEN NEEDLE NANO U/F 32G X 4 MM MISC USE DAILY WITH LANTUS SOLASTAR 100 each 11  . blood glucose meter kit and supplies KIT Dispense based on patient and insurance preference. Use up to three times daily as directed. (FOR ICD-9 250.00, 250.01). 1 each 0  . calcitRIOL (ROCALTROL) 0.25 MCG capsule Take 0.25 mcg by mouth daily.    . calcitRIOL (ROCALTROL) 0.5 MCG capsule Take by mouth daily.    . clopidogrel (PLAVIX) 75 MG tablet TAKE 1 TABLET BY MOUTH EVERY DAY 90 tablet 1  . Cyanocobalamin (B-12 PO) Take 1 tablet by mouth daily.    . diclofenac sodium (VOLTAREN) 1 % GEL Apply 4 g topically 4 (four) times daily as needed. 400 g 11  . doxycycline  (VIBRA-TABS) 100 MG tablet Take 1 tablet (100 mg total) by mouth 2 (two) times daily. 20 tablet 0  . ferrous sulfate 325 (65 FE) MG tablet TAKE 1 TABLET BY MOUTH TWICE A DAY 180 tablet 1  . furosemide (LASIX) 80 MG tablet Take 0.5 tablets (40 mg total) by mouth 2 (two) times daily.    Marland Kitchen gabapentin (NEURONTIN) 100 MG capsule Take 1 capsule (100 mg total) by  mouth 3 (three) times daily. 90 capsule 5  . hydrOXYzine (ATARAX/VISTARIL) 10 MG tablet Take 1 tablet (10 mg total) by mouth 3 (three) times daily as needed for itching. 30 tablet 0  . Insulin Glargine (LANTUS SOLOSTAR) 100 UNIT/ML Solostar Pen Inject 18 Units into the skin daily. 5 pen 11  . insulin lispro (HUMALOG KWIKPEN) 100 UNIT/ML KwikPen INEJCT 0-9 UNITS AS DIRECTED INTO SKIN 3 TIMES A DAY 15 mL 3  . Lancets MISC Use as directed three times daily with meals 100 each 3  . metoprolol tartrate (LOPRESSOR) 100 MG tablet TAKE 1 TABLET BY MOUTH TWICE A DAY 180 tablet 2  . MYRBETRIQ 25 MG TB24 tablet Take 25 mg by mouth daily.    . nitroGLYCERIN (NITROSTAT) 0.4 MG SL tablet Place 1 tablet (0.4 mg total) under the tongue every 5 (five) minutes as needed for chest pain. Reported on 04/06/2016 25 tablet 5  . omeprazole (PRILOSEC) 20 MG capsule Take 1 capsule (20 mg total) by mouth daily. Annual appt due in Febuary must see provider for future refills 90 capsule 0  . polyethylene glycol powder (GLYCOLAX/MIRALAX) powder Take 17 gm by mouth daily 3350 g 11  . Potassium Chloride ER 20 MEQ TBCR Take 20 mEq by mouth daily.   6  . risedronate (ACTONEL) 150 MG tablet TAKE 1 TABLET (150 MG TOTAL) BY MOUTH EVERY 30 (THIRTY) DAYS. FIRST TUESDAY 3 tablet 3  . sodium bicarbonate 650 MG tablet Take 650 mg by mouth 2 (two) times daily.    . Tetrahydrozoline HCl (VISINE OP) Place 2 drops into both eyes daily as needed (DRY EYE).    Marland Kitchen thiamine (VITAMIN B-1) 50 MG tablet Take 1 tablet (50 mg total) by mouth daily. 90 tablet 1  . timolol (TIMOPTIC) 0.5 % ophthalmic  solution PLACE 1 DROP IN BOTH EYES DAILY    . tiZANidine (ZANAFLEX) 4 MG tablet TAKE 1 TABLET BY MOUTH EVERY 6 HOURS AS NEEDED FOR MUSCLE SPASMS 60 tablet 0  . traMADol (ULTRAM) 50 MG tablet TAKE 1 TABLET BY MOUTH EVERY 6 HOURS AS NEEDED 120 tablet 2  . Vitamin D, Ergocalciferol, (DRISDOL) 1.25 MG (50000 UT) CAPS capsule Take 1 capsule (50,000 Units total) by mouth every 7 (seven) days. 12 capsule 0  . zolpidem (AMBIEN) 5 MG tablet TAKE 1 TABLET (5 MG TOTAL) BY MOUTH AT BEDTIME AS NEEDED FOR SLEEP. 30 tablet 5   No current facility-administered medications for this visit.    No Known Allergies   REVIEW OF SYSTEMS:   '[X]'$  denotes positive finding, '[ ]'$  denotes negative finding Cardiac  Comments:  Chest pain or chest pressure:    Shortness of breath upon exertion:    Short of breath when lying flat:    Irregular heart rhythm:        Vascular    Pain in calf, thigh, or hip brought on by ambulation: x occasionally  Pain in feet at night that wakes you up from your sleep:  x occasionally  Blood clot in your veins:    Leg swelling:         Pulmonary    Oxygen at home:    Productive cough:     Wheezing:         Neurologic    Sudden weakness in arms or legs:     Sudden numbness in arms or legs:     Sudden onset of difficulty speaking or slurred speech:    Temporary loss of vision in  one eye:     Problems with dizziness:         Gastrointestinal    Blood in stool:     Vomited blood:         Genitourinary    Burning when urinating:     Blood in urine:        Psychiatric    Major depression:         Hematologic    Bleeding problems:    Problems with blood clotting too easily:        Skin    Rashes or ulcers: x  bilateral venous stasis ulcers      Constitutional    Fever or chills:      PHYSICAL EXAMINATION:      Vitals:   12/30/19 0926  Resp: 16  Weight: 223 lb (101.2 kg)  Height: '5\' 1"'$  (1.549 m)    General:  WDWN in NAD; vital signs documented  above Gait: Slow, steady. Uses cane HENT: WNL, normocephalic Pulmonary: normal non-labored breathing , without Rales, rhonchi,  wheezing Cardiac: regular HR, without  Murmurs without carotid bruits Abdomen: soft, NT, no masses Skin: without rashes Vascular Exam/Pulses: Extremities: without ischemic changes, without Gangrene , without cellulitis; without open wounds; small <1cm venous stasis ulcers of right lower leg/ankle. Evidence of healed small ulcers on right lower leg. Musculoskeletal: no muscle wasting or atrophy  Neurologic: A&O X 3;  No focal weakness or paresthesias are detected Psychiatric:  The pt has Normal affect.  Pulse exam: Limited by body habitus. 1= bil radial pulses. Unable to palpate femoral, popliteal or pedal pulses.  She has brisk biphasic dorsalis pedis, posterior tibial and peroneal pulses on the left.  Biphasic dopplerable peroneal pulse on the right. DP and PT pulses are brisk, but more monophasic    Non-Invasive Vascular Imaging:   ABI/TBIToday's ABIToday's TBIPrevious ABIPrevious TBI  +-------+-----------+-----------+------------+------------+  Right [place]     0.55    [place]     0.40      +-------+-----------+-----------+------------+------------+  Left  0.76    0.52    0.61    0.36       Exam is limited by body habitus.    Signals are biphasic bilaterally.  IVC/Iliac: Suboptimal exam, limited visualization. Further imaging  modality may be warranted.   Unable to visualized bilateral common iliac artery stents.  Probable greater than 50% external iliac artery stenosis, common iliac  arteries not visualized.     ASSESSMENT/PLAN:: 74 y.o. female here for follow up for peripheral arterial disease status post bilateral common iliac artery stent placement in 2019.  The patient reports some degree of lower extremity pain not consistent with rest pain.  She has noncompressible right lower extremity arteries.  ABI on  the left is improved. Stable lower extremity peripheral arterial disease.  Continue Plavix, aspirin, statin and routine surveillance.  Continue walking as much as possible Venous insufficiency.  No signs of cellulitis today.  Continue elevation and compression stockings.  Follow-up ABIs and duplex arterial exam in 1 year. History of mild bilateral carotid artery stenosis.  Recommend follow-up carotid duplex in 1 year    Barbie Banner, PA-C [facility] [phone]  Clinic MD:   Trula Slade

## 2019-12-31 ENCOUNTER — Other Ambulatory Visit: Payer: Self-pay | Admitting: *Deleted

## 2019-12-31 DIAGNOSIS — I739 Peripheral vascular disease, unspecified: Secondary | ICD-10-CM

## 2019-12-31 DIAGNOSIS — I779 Disorder of arteries and arterioles, unspecified: Secondary | ICD-10-CM

## 2019-12-31 DIAGNOSIS — I6523 Occlusion and stenosis of bilateral carotid arteries: Secondary | ICD-10-CM

## 2020-01-02 ENCOUNTER — Encounter (HOSPITAL_BASED_OUTPATIENT_CLINIC_OR_DEPARTMENT_OTHER): Payer: Medicare Other | Attending: Internal Medicine | Admitting: Internal Medicine

## 2020-01-03 ENCOUNTER — Other Ambulatory Visit: Payer: Self-pay

## 2020-01-03 ENCOUNTER — Ambulatory Visit (HOSPITAL_COMMUNITY)
Admission: RE | Admit: 2020-01-03 | Discharge: 2020-01-03 | Disposition: A | Discharge status: Home / Self Care | Payer: Medicare Other | Source: Ambulatory Visit | Attending: Nephrology | Admitting: Nephrology

## 2020-01-03 VITALS — BP 153/79 | HR 72 | Temp 96.6°F | Resp 20

## 2020-01-03 DIAGNOSIS — D539 Nutritional anemia, unspecified: Secondary | ICD-10-CM | POA: Insufficient documentation

## 2020-01-03 LAB — IRON AND TIBC
Iron: 63 ug/dL (ref 28–170)
Saturation Ratios: 27 % (ref 10.4–31.8)
TIBC: 230 ug/dL — ABNORMAL LOW (ref 250–450)
UIBC: 167 ug/dL

## 2020-01-03 LAB — POCT HEMOGLOBIN-HEMACUE: Hemoglobin: 12.7 g/dL (ref 12.0–15.0)

## 2020-01-03 LAB — FERRITIN: Ferritin: 907 ng/mL — ABNORMAL HIGH (ref 11–307)

## 2020-01-03 MED ORDER — EPOETIN ALFA 10000 UNIT/ML IJ SOLN: 10000.0000 [IU] | INTRAMUSCULAR | Status: DC

## 2020-01-03 NOTE — Progress Notes (Signed)
Pt wants to wait her normal scheduled time to  Return vs coming in 2 weeks for a recheck.  States her hemocue has been the same since Jan 4,2021 at her last appointment.  Scheduled as to her request.

## 2020-01-04 ENCOUNTER — Other Ambulatory Visit: Payer: Self-pay | Admitting: Internal Medicine

## 2020-01-06 ENCOUNTER — Ambulatory Visit: Payer: Medicare Other | Admitting: Internal Medicine

## 2020-01-07 ENCOUNTER — Other Ambulatory Visit: Payer: Self-pay | Admitting: Internal Medicine

## 2020-01-07 ENCOUNTER — Other Ambulatory Visit: Payer: Self-pay | Admitting: *Deleted

## 2020-01-07 NOTE — Patient Outreach (Signed)
[facility]) Care Management  01/07/2020  [name] Sep 16, 1946 [id number]    Telephone Assessment-Successful-DM  RN spoke with pt today and received an update in her ongoing management of care. Pt reports she continues to do well with her recent A1C at 7.5 on 3/1. Previous read was 10.1 in Dec. Pt has made great progress as Chief Strategy Officer pt for her management of care. Pt has verify she has changed her dietary intake lowering her carbs and increasing her proteins. Pt verified all upcoming appointments with her CAD 3/18, kidney 3/22 and recently with her primary on 3/1 who is following pt for her diabetes as noted above with her new A1c.  Plan of care discussed and updated accordingly concerning pt's dietary habits, much improved and A1c, also improved. RN continues to encouraged pt to continue to review the educational printed material for managing her diabetes and offered any additional assistance concerning tools and/or community resources to continue her ongoing management of care (note at this time). Praise pt for her efforts and will continue to encourage adherence with her next follow up in 3 months. If pt continue to adhere will discussed transfer to a health coach for ongoing follow ups. Will continue to communicate with pt's primary provider on pt's disposition with [facility] services accordingly.   THN CM Care Plan Problem One     Most Recent Value  Care Plan Problem One  Knowledge Deficit of diabetes control related to A1c-8.1%  Role Documenting the Problem One  Care Management [place] for Problem One  Active  THN Long Term Goal   Pt will report a reduction in A1c 1-2 points in the next 90 days.  THN Long Term Goal Start Date  07/11/19  Interventions for Problem One Long Term Goal  Will verify all readings and continue to strongly encouraged adherence with managing her diabetes through healthy eating habits and controlling her snacking throughout the day  and night, Will continue to encourage adherence to improve her readings with theprovider information and education on diabetes. Will follow up in 3 months to re-evaluate prior to health coach if pt remains adherent.   THN CM Short Term Goal #1   Pt will be able to recognize a healthy food items related to modification to create an appropriate diabetic diet,  THN CM Short Term Goal #1 Start Date  07/11/19  [facility] CM Short Term Goal #1 Met Date  01/07/20      Raina Mina, RN Care Management Coordinator [place] Office [phone]

## 2020-01-08 ENCOUNTER — Ambulatory Visit: Payer: Medicare Other | Attending: Internal Medicine

## 2020-01-08 ENCOUNTER — Other Ambulatory Visit: Payer: Self-pay | Admitting: Internal Medicine

## 2020-01-08 DIAGNOSIS — Z23 Encounter for immunization: Secondary | ICD-10-CM

## 2020-01-08 NOTE — Telephone Encounter (Signed)
Please refill as per office routine med refill policy (all routine meds refilled for 3 mo or monthly per pt preference up to one year from last visit, then month to month grace period for 3 mo, then further med refills will have to be denied)  

## 2020-01-08 NOTE — Progress Notes (Signed)
   Covid-19 Vaccination Clinic  Name:  [name]    MRN: [id number] DOB: 02/13/1946  01/08/2020  [name] was observed post Covid-19 immunization for 15 minutes without incident. She was provided with Vaccine Information Sheet and instruction to access the V-Safe system.   [name] was instructed to call 911 with any severe reactions post vaccine: Marland Kitchen Difficulty breathing  . Swelling of face and throat  . A fast heartbeat  . A bad rash all over body  . Dizziness and weakness   Immunizations Administered    Name Date Dose VIS Date Route   Pfizer COVID-19 Vaccine 01/08/2020  2:16 PM 0.3 mL 10/04/2019 Intramuscular   Manufacturer: [place]   Lot: [id number]   [place]: [id number]

## 2020-01-09 ENCOUNTER — Ambulatory Visit: Payer: Medicare Other | Admitting: Cardiovascular Disease

## 2020-01-09 ENCOUNTER — Encounter: Payer: Self-pay | Admitting: Cardiovascular Disease

## 2020-01-09 ENCOUNTER — Encounter (HOSPITAL_COMMUNITY): Payer: Medicare Other

## 2020-01-09 ENCOUNTER — Other Ambulatory Visit: Payer: Self-pay

## 2020-01-09 VITALS — BP 180/96 | HR 78 | Ht 61.0 in | Wt 223.1 lb

## 2020-01-09 DIAGNOSIS — I5032 Chronic diastolic (congestive) heart failure: Secondary | ICD-10-CM | POA: Diagnosis not present

## 2020-01-09 DIAGNOSIS — I739 Peripheral vascular disease, unspecified: Secondary | ICD-10-CM

## 2020-01-09 DIAGNOSIS — N184 Chronic kidney disease, stage 4 (severe): Secondary | ICD-10-CM | POA: Diagnosis not present

## 2020-01-09 DIAGNOSIS — I25119 Atherosclerotic heart disease of native coronary artery with unspecified angina pectoris: Secondary | ICD-10-CM

## 2020-01-09 DIAGNOSIS — I10 Essential (primary) hypertension: Secondary | ICD-10-CM | POA: Diagnosis not present

## 2020-01-09 NOTE — Patient Instructions (Signed)
Medication Instructions:  Your provider recommends that you continue on your current medications as directed. Please refer to the Current Medication list given to you today.   *If you need a refill on your cardiac medications before your next appointment, please call your pharmacy*   Follow-Up: At [facility], you and your health needs are our priority.  As part of our continuing mission to provide you with exceptional heart care, we have created designated Provider Care Teams.  These Care Teams include your primary Cardiologist (physician) and Advanced Practice Providers (APPs -  Physician Assistants and Nurse Practitioners) who all work together to provide you with the care you need, when you need it. Your next appointment:   12 month(s) The format for your next appointment:   In Person Provider:   You may see Sherren Mocha, MD or one of the following Advanced Practice Providers on your designated Care Team:    Richardson Dopp, PA-C  Vin Pheba, [place]  Daune Perch, [place]

## 2020-01-09 NOTE — Progress Notes (Signed)
Cardiology Office Note:    Date:  01/13/2020   ID:  Ashley, [name] Feb 24, 1946, MRN [id number]  PCP:  [name], MD  Cardiologist:  Sherren Mocha, MD  Electrophysiologist:  None   Referring MD: [name], MD   Chief Complaint  Patient presents with  . Shortness of Breath    History of Present Illness:    [name] is a 74 y.o. female with a hx of coronary artery disease, hypertension, and mixed hyperlipidemia, presenting for follow-up evaluation.  The patient underwent DES implantation in the circumflex in 2004.  She has a history of renal artery stenosis with occlusion of the left renal artery and an atrophic left kidney.  She has peripheral arterial disease and has undergone bilateral iliac stenting.  She is followed by vascular surgery.  Other medical problems include chronic kidney disease stage 4, type 2 diabetes, hypertension, hyperlipidemia, and carotid stenosis.  She follows regularly with Dr Justin Mend, next visit scheduled for next week. Last creatinine on file in our system is 2.83 mg/dL on 3/12021.  The patient is here alone today.  She seems to be getting along okay.  She still lives with her daughter and they have now been living together for about 2 years.  She is not very active anymore.  She has exertional dyspnea which has not changed over the past year.  She denies orthopnea, PND, or chest pain.  She denies leg swelling.  Past Medical History:  Diagnosis Date  . Anemia, iron deficiency   . Aortic atherosclerosis ([place]) 11/17/2017  . Arthritis   . CAD (coronary artery disease)   . Carotid stenosis    Carotid US (02/2014):  Bilateral ICA 40-59%; > 50% L ECA; F/u 1 year  . Cataract    removed both eyes  . Cholelithiasis 11/17/2017  . Chronic kidney disease    chronic renal failure  . DM2 (diabetes mellitus, type 2) ([place])   . GERD (gastroesophageal reflux disease)   . Glaucoma   . HLD (hyperlipidemia)   . HTN (hypertension)   . Hx of  cardiovascular stress test    Lexiscan Myoview (03/24/14):  No ischemia, EF 73%, Normal  . Hypoglycemia 06/27/2017  . Mitral regurgitation   . Osteopenia 07/21/2017  . Osteoporosis   . PUD (peptic ulcer disease)   . PVD (peripheral vascular disease) ([place])   . Shortness of breath    with exertion    Past Surgical History:  Procedure Laterality Date  . ABDOMINAL AORTOGRAM W/LOWER EXTREMITY N/A 08/13/2018   Procedure: ABDOMINAL AORTOGRAM W/LOWER EXTREMITY;  Surgeon: Waynetta Sandy, MD;  Location: [facility];  Service: Cardiovascular;  Laterality: N/A;  . ABDOMINAL HYSTERECTOMY    . APPENDECTOMY    . AV FISTULA PLACEMENT Right 01/18/2013   Procedure: ARTERIOVENOUS (AV) FISTULA CREATION;  Surgeon: Rosetta Posner, MD;  Location: [facility];  Service: Vascular;  Laterality: Right;  Ultrasound guided  . COLONOSCOPY    . CORONARY ANGIOPLASTY WITH STENT PLACEMENT    . PERIPHERAL VASCULAR INTERVENTION Bilateral 08/13/2018   Procedure: PERIPHERAL VASCULAR INTERVENTION;  Surgeon: Waynetta Sandy, MD;  Location: [facility];  Service: Cardiovascular;  Laterality: Bilateral;  . RENAL ANGIOGRAM N/A 09/16/2011   Procedure: RENAL ANGIOGRAM;  Surgeon: Sherren Mocha, MD;  Location: [facility];  Service: Cardiovascular;  Laterality: N/A;    Current Medications: Current Meds  Medication Sig  . ACCU-CHEK AVIVA PLUS test strip USE AS INSTRUCTED THREE TIMES DAILY.  DX E11.9  . acetaminophen (TYLENOL) 500 MG tablet Take 1-2 tablets (500-1,000 mg total) by mouth every 8 (eight) hours as needed for mild pain.  Marland Kitchen allopurinol (ZYLOPRIM) 100 MG tablet Take 100 mg by mouth daily.  Marland Kitchen amLODipine (NORVASC) 10 MG tablet Take 1 tablet (10 mg total) by mouth daily.  Marland Kitchen aspirin 81 MG tablet Take 81 mg by mouth daily.  Marland Kitchen atorvastatin (LIPITOR) 80 MG tablet TAKE 1 TABLET BY MOUTH DAILY AT 6PM  . BD PEN NEEDLE NANO U/F 32G X 4 MM MISC USE DAILY WITH LANTUS SOLASTAR  . blood glucose meter kit and  supplies KIT Dispense based on patient and insurance preference. Use up to three times daily as directed. (FOR ICD-9 250.00, 250.01).  . calcitRIOL (ROCALTROL) 0.5 MCG capsule Take by mouth daily.  . clopidogrel (PLAVIX) 75 MG tablet TAKE 1 TABLET BY MOUTH EVERY DAY  . Cyanocobalamin (B-12 PO) Take 1 tablet by mouth daily.  . diclofenac sodium (VOLTAREN) 1 % GEL Apply 4 g topically 4 (four) times daily as needed.  . doxycycline (VIBRA-TABS) 100 MG tablet Take 1 tablet (100 mg total) by mouth 2 (two) times daily.  . ferrous sulfate 325 (65 FE) MG tablet TAKE 1 TABLET BY MOUTH TWICE A DAY  . furosemide (LASIX) 80 MG tablet Take 0.5 tablets (40 mg total) by mouth 2 (two) times daily.  Marland Kitchen gabapentin (NEURONTIN) 100 MG capsule TAKE 1 CAPSULE BY MOUTH THREE TIMES A DAY  . hydrOXYzine (ATARAX/VISTARIL) 10 MG tablet Take 1 tablet (10 mg total) by mouth 3 (three) times daily as needed for itching.  . Insulin Glargine (LANTUS SOLOSTAR) 100 UNIT/ML Solostar Pen Inject 18 Units into the skin daily.  . insulin lispro (HUMALOG KWIKPEN) 100 UNIT/ML KwikPen INEJCT 0-9 UNITS AS DIRECTED INTO SKIN 3 TIMES A DAY  . Lancets MISC Use as directed three times daily with meals  . metoprolol tartrate (LOPRESSOR) 100 MG tablet TAKE 1 TABLET BY MOUTH TWICE A DAY  . MYRBETRIQ 25 MG TB24 tablet Take 25 mg by mouth daily.  . nitroGLYCERIN (NITROSTAT) 0.4 MG SL tablet Place 1 tablet (0.4 mg total) under the tongue every 5 (five) minutes as needed for chest pain. Reported on 04/06/2016  . omeprazole (PRILOSEC) 20 MG capsule TAKE 1 CAPSULE BY MOUTH EVERY DAY *APPT DUE IN FEB FOR REFILLS*  . polyethylene glycol powder (GLYCOLAX/MIRALAX) powder Take 17 gm by mouth daily  . Potassium Chloride ER 20 MEQ TBCR Take 20 mEq by mouth daily.   . risedronate (ACTONEL) 150 MG tablet TAKE 1 TABLET (150 MG TOTAL) BY MOUTH EVERY 30 (THIRTY) DAYS. FIRST TUESDAY  . sodium bicarbonate 650 MG tablet Take 650 mg by mouth 2 (two) times daily.  .  Tetrahydrozoline HCl (VISINE OP) Place 2 drops into both eyes daily as needed (DRY EYE).  Marland Kitchen thiamine (VITAMIN B-1) 50 MG tablet Take 1 tablet (50 mg total) by mouth daily.  . timolol (TIMOPTIC) 0.5 % ophthalmic solution PLACE 1 DROP IN BOTH EYES DAILY  . tiZANidine (ZANAFLEX) 4 MG tablet TAKE 1 TABLET BY MOUTH EVERY 6 HOURS AS NEEDED FOR MUSCLE SPASMS  . traMADol (ULTRAM) 50 MG tablet TAKE 1 TABLET BY MOUTH EVERY 6 HOURS AS NEEDED  . Vitamin D, Ergocalciferol, (DRISDOL) 1.25 MG (50000 UT) CAPS capsule Take 1 capsule (50,000 Units total) by mouth every 7 (seven) days.  Marland Kitchen zolpidem (AMBIEN) 5 MG tablet TAKE 1 TABLET (5 MG TOTAL) BY MOUTH AT BEDTIME AS NEEDED FOR SLEEP.  Allergies:   Patient has no known allergies.   Social History   Socioeconomic History  . Marital status: Single    Spouse name: Not on file  . Number of children: 3  . Years of education: Not on file  . Highest education level: Not on file  Occupational History  . Occupation: homemaker  Tobacco Use  . Smoking status: Former Smoker    Quit date: 10/25/2007    Years since quitting: 12.2  . Smokeless tobacco: Never Used  . Tobacco comment: Stopped 1.5 yrs ago January 2009  Substance and Sexual Activity  . Alcohol use: No  . Drug use: No  . Sexual activity: Never  Other Topics Concern  . Not on file  Social History Narrative  . Not on file   Social Determinants of Health   Financial Resource Strain:   . Difficulty of Paying Living Expenses:   Food Insecurity:   . Worried About Charity fundraiser in the Last Year:   . Arboriculturist in the Last Year:   Transportation Needs:   . Film/video editor (Medical):   Marland Kitchen Lack of Transportation (Non-Medical):   Physical Activity:   . Days of Exercise per Week:   . Minutes of Exercise per Session:   Stress:   . Feeling of Stress :   Social Connections:   . Frequency of Communication with Friends and Family:   . Frequency of Social Gatherings with Friends and  Family:   . Attends Religious Services:   . Active Member of Clubs or Organizations:   . Attends Archivist Meetings:   Marland Kitchen Marital Status:      Family History: The patient's family history includes Diabetes in her sister and son; Glaucoma in her father; Heart disease in her father. There is no history of Colon cancer, Colon polyps, Esophageal cancer, Rectal cancer, or Stomach cancer.  ROS:   Please see the history of present illness.    All other systems reviewed and are negative.  EKGs/Labs/Other Studies Reviewed:    The following studies were reviewed today: Abdominal Aortic US 12-30-19: Summary:  IVC/Iliac: Suboptimal exam, limited visualization. Further imaging  modality may be warranted.   Unable to visualized bilateral common iliac artery stents.  Probable greater than 50% external iliac artery stenosis, common iliac  arteries not visualized.   ABI 12/30/19: ABI/TBIToday's ABIToday's TBIPrevious ABIPrevious TBI  +-------+-----------+-----------+------------+------------+  Right [place]     0.55    [place]     0.40      +-------+-----------+-----------+------------+------------+  Left  0.76    0.52    0.61    0.36      +-------+-----------+-----------+------------+------------+       Summary:  Right: Resting right ankle-brachial index indicates noncompressible right  lower extremity arteries. The right toe-brachial index is abnormal.   Left: Resting left ankle-brachial index indicates moderate left lower  extremity arterial disease. The left toe-brachial index is abnormal.   EKG:  EKG is ordered today.  The ekg ordered today demonstrates NSR 75 bpm, nonspecific ST change noted  Recent Labs: 06/20/2019: Platelets 367.0; TSH 3.34 12/23/2019: ALT 12; BUN 36; Creatinine, Ser 2.83; Potassium 3.4; Sodium 142 01/03/2020: Hemoglobin 12.7  Recent Lipid Panel    Component Value Date/Time   CHOL 168 12/23/2019 1604   TRIG 158.0  (H) 12/23/2019 1604   HDL 32.20 (L) 12/23/2019 1604   CHOLHDL 5 12/23/2019 1604   VLDL 31.6 12/23/2019 1604   LDLCALC 104 (H) 12/23/2019 1604  LDLDIRECT 101.0 06/20/2019 1517    Physical Exam:    VS:  BP (!) 180/96   Pulse 78   Ht '5\' 1"'$  (1.549 m)   Wt 223 lb 1.9 oz (101.2 kg)   SpO2 97%   BMI 42.16 kg/m     Wt Readings from Last 3 Encounters:  01/09/20 223 lb 1.9 oz (101.2 kg)  12/30/19 223 lb (101.2 kg)  12/23/19 231 lb 6.4 oz (105 kg)     GEN:  Pleasant obese woman in no acute distress HEENT: Normal NECK: No JVD; No carotid bruits LYMPHATICS: No lymphadenopathy CARDIAC: RRR, no murmurs, rubs, gallops RESPIRATORY:  Clear to auscultation without rales, wheezing or rhonchi  ABDOMEN: Soft, non-tender, non-distended MUSCULOSKELETAL:  No edema; No deformity  SKIN: Warm and dry NEUROLOGIC:  Alert and oriented x 3 PSYCHIATRIC:  Normal affect   ASSESSMENT:    1. PAD (peripheral artery disease) ([place])   2. Chronic diastolic heart failure ([place])   3. Coronary artery disease involving native coronary artery of native heart with angina pectoris ([place])   4. CKD (chronic kidney disease) stage 4, GFR 15-29 ml/min ([facility])   5. Essential hypertension    PLAN:    In order of problems listed above:  1. Appears stable on antiplatelet therapy with aspirin and a high intensity statin drug.  Followed by vascular surgery.  I reviewed her vascular studies as outlined above. 2. Overall does not appear to be significantly limited by heart failure.  Detect any signs of volume excess on her exam, but there is some limitation due to her morbid obesity.  Weight control, sodium restriction, and blood pressure control organ to be important moving forward. 3. No anginal symptoms.  Continue medical therapy as outlined. 4. Followed closely by Dr. Justin Mend.  I reviewed her labs with most recent creatinine as outlined in the HPI. 5. Blood pressure is elevated today.  She is currently taking amlodipine 10 mg  and metoprolol 100 mg twice daily.  She is not a good candidate for an ACE/ARB/spironolactone because of stage IV kidney disease.  Options are fairly limited and likely would include either clonidine or hydralazine.  Will monitor and await her follow-up with Dr. Justin Mend.   Medication Adjustments/Labs and Tests Ordered: Current medicines are reviewed at length with the patient today.  Concerns regarding medicines are outlined above.  Orders Placed This Encounter  Procedures  . EKG 12-Lead   No orders of the defined types were placed in this encounter.   Patient Instructions  Medication Instructions:  Your provider recommends that you continue on your current medications as directed. Please refer to the Current Medication list given to you today.   *If you need a refill on your cardiac medications before your next appointment, please call your pharmacy*   Follow-Up: At [facility], you and your health needs are our priority.  As part of our continuing mission to provide you with exceptional heart care, we have created designated Provider Care Teams.  These Care Teams include your primary Cardiologist (physician) and Advanced Practice Providers (APPs -  Physician Assistants and Nurse Practitioners) who all work together to provide you with the care you need, when you need it. Your next appointment:   12 month(s) The format for your next appointment:   In Person Provider:   You may see Sherren Mocha, MD or one of the following Advanced Practice Providers on your designated Care Team:    Richardson Dopp, PA-C  [place], [place]  Janine  Phylliss Bob, NP      Signed, Sherren Mocha, MD  01/13/2020 5:29 PM    [facility]

## 2020-01-13 DIAGNOSIS — N2889 Other specified disorders of kidney and ureter: Secondary | ICD-10-CM | POA: Diagnosis not present

## 2020-01-13 DIAGNOSIS — N189 Chronic kidney disease, unspecified: Secondary | ICD-10-CM | POA: Diagnosis not present

## 2020-01-13 DIAGNOSIS — N2581 Secondary hyperparathyroidism of renal origin: Secondary | ICD-10-CM | POA: Diagnosis not present

## 2020-01-13 DIAGNOSIS — E1122 Type 2 diabetes mellitus with diabetic chronic kidney disease: Secondary | ICD-10-CM | POA: Diagnosis not present

## 2020-01-13 DIAGNOSIS — N184 Chronic kidney disease, stage 4 (severe): Secondary | ICD-10-CM | POA: Diagnosis not present

## 2020-01-13 DIAGNOSIS — D631 Anemia in chronic kidney disease: Secondary | ICD-10-CM | POA: Diagnosis not present

## 2020-01-15 ENCOUNTER — Other Ambulatory Visit: Payer: Self-pay | Admitting: Internal Medicine

## 2020-01-16 ENCOUNTER — Other Ambulatory Visit: Payer: Self-pay

## 2020-01-16 ENCOUNTER — Encounter (HOSPITAL_BASED_OUTPATIENT_CLINIC_OR_DEPARTMENT_OTHER): Payer: Medicare Other | Attending: Internal Medicine | Admitting: Internal Medicine

## 2020-01-16 DIAGNOSIS — L97821 Non-pressure chronic ulcer of other part of left lower leg limited to breakdown of skin: Secondary | ICD-10-CM | POA: Insufficient documentation

## 2020-01-16 DIAGNOSIS — E11622 Type 2 diabetes mellitus with other skin ulcer: Secondary | ICD-10-CM | POA: Diagnosis not present

## 2020-01-16 DIAGNOSIS — E1122 Type 2 diabetes mellitus with diabetic chronic kidney disease: Secondary | ICD-10-CM | POA: Insufficient documentation

## 2020-01-16 DIAGNOSIS — M8580 Other specified disorders of bone density and structure, unspecified site: Secondary | ICD-10-CM | POA: Diagnosis not present

## 2020-01-16 DIAGNOSIS — E1151 Type 2 diabetes mellitus with diabetic peripheral angiopathy without gangrene: Secondary | ICD-10-CM | POA: Diagnosis not present

## 2020-01-16 DIAGNOSIS — M810 Age-related osteoporosis without current pathological fracture: Secondary | ICD-10-CM | POA: Diagnosis not present

## 2020-01-16 DIAGNOSIS — K219 Gastro-esophageal reflux disease without esophagitis: Secondary | ICD-10-CM | POA: Diagnosis not present

## 2020-01-16 DIAGNOSIS — N189 Chronic kidney disease, unspecified: Secondary | ICD-10-CM | POA: Diagnosis not present

## 2020-01-16 DIAGNOSIS — I700 Atherosclerosis of aorta: Secondary | ICD-10-CM | POA: Diagnosis not present

## 2020-01-16 DIAGNOSIS — I2510 Atherosclerotic heart disease of native coronary artery without angina pectoris: Secondary | ICD-10-CM | POA: Diagnosis not present

## 2020-01-16 DIAGNOSIS — Z87891 Personal history of nicotine dependence: Secondary | ICD-10-CM | POA: Diagnosis not present

## 2020-01-16 DIAGNOSIS — I87322 Chronic venous hypertension (idiopathic) with inflammation of left lower extremity: Secondary | ICD-10-CM | POA: Insufficient documentation

## 2020-01-16 DIAGNOSIS — E785 Hyperlipidemia, unspecified: Secondary | ICD-10-CM | POA: Diagnosis not present

## 2020-01-16 DIAGNOSIS — I129 Hypertensive chronic kidney disease with stage 1 through stage 4 chronic kidney disease, or unspecified chronic kidney disease: Secondary | ICD-10-CM | POA: Insufficient documentation

## 2020-01-16 NOTE — Progress Notes (Signed)
Ashley, [name] ([id number]) Visit Report for 01/16/2020 Chief Complaint Document Details Patient Name: Date of Service: [name], [name] 01/16/2020 10:30 AM Medical Record [id number] Patient Account Number: [id number] Date of Birth/Sex: Treating RN: 12/22/1945 (74 y.o. F) Primary Care Provider: Cathlean Cower Other Clinician: Referring Provider: Treating Provider/Extender:Kitana Gage, Arta Bruce, Casper Harrison in Treatment: 0 Information Obtained from: Patient Chief Complaint 01/16/2020; patient is here for to review of 2 small wounds on the left anterior lower leg Electronic Signature(s) Signed: 01/16/2020 6:03:33 PM By: Linton Ham MD Entered By: Linton Ham on 01/16/2020 13:15:47 -------------------------------------------------------------------------------- HPI Details Patient Name: Date of Service: [name] 01/16/2020 10:30 AM Medical Record [id number] Patient Account Number: [id number] Date of Birth/Sex: Treating RN: Feb 12, 1946 (74 y.o. F) Primary Care Provider: Cathlean Cower Other Clinician: Referring Provider: Treating Provider/Extender:Lada Fulbright, Arta Bruce, Casper Harrison in Treatment: 0 History of Present Illness HPI Description: ADMISSION 01/16/20 This is a 74 year old woman with diabetes. I am not exactly sure what type of diabetes this is that she is on insulin. A brief review of [place] link did not really solve this issue. In any case she has wounds on her left anterior lower leg that have been there about 3 weeks. She has been tight using topical antibiotics and her own stocking and she became worried because these would not heal. She also has arterial disease and has I think previous bilateral common iliac artery stents. She had recent arterial studies that showed an ABI on the right at noncompressible on the left at 0.77 TBI's of 0.55 on the right and 0.52 on the left. She is here for our review of this. Past medical history; I think  the patient probably has chronic venous insufficiency and wears stockings for this religiously although she did not bring them in and I am not really sure how old they are either. As noted she has diabetic PAD, hyperlipidemia, coronary artery disease, renal artery stenosis. Electronic Signature(s) Signed: 01/16/2020 6:03:33 PM By: Linton Ham MD Entered By: Linton Ham on 01/16/2020 13:21:18 -------------------------------------------------------------------------------- Physical Exam Details Patient Name: Date of Service: [name] 01/16/2020 10:30 AM Medical Record [id number] Patient Account Number: [id number] Date of Birth/Sex: Treating RN: 1945-10-29 (74 y.o. F) Primary Care Provider: Cathlean Cower Other Clinician: Referring Provider: Treating Provider/Extender:Cully Luckow, Arta Bruce, Casper Harrison in Treatment: 0 Constitutional Pulse regular and within target range for patient.. Temperature is normal and within the target range for the patient.Marland Kitchen Appears in no distress. Cardiovascular The pulses are palpable.. Severe changes of chronic venous insufficiency in her left ankle and lower leg area. Chronically fibrosed skin in this area. She does not have an much in the way of edema but any degree of edema would be dangerous in her leg like this for skin breakdown.. Integumentary (Hair, Skin) Severely damaged skin in the left lower leg just above the ankle. Tightly fibrosed.Marland Kitchen Psychiatric appears at normal baseline. Notes Wound exam; the patient has 2 small superficial wounds anteriorly in the anterior lower leg just above the ankle. Tightly fibrosed skin in this area. She has had several small wounds bilaterally in her lower legs which have obviously healed looking at the scars. 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 Electronic Signature(s) Signed: 01/16/2020 6:03:33 PM By: Linton Ham MD Entered By: Linton Ham on 01/16/2020  13:23:17 -------------------------------------------------------------------------------- Physician Orders Details Patient Name: Date of Service: [name] 01/16/2020 10:30 AM Medical Record [id number] Patient Account Number: [id number] Date of Birth/Sex: Treating RN: Apr 07, 1946 (74 y.o. F)  Deon Pilling Primary Care Provider: Cathlean Cower Other Clinician: Referring Provider: Treating Provider/Extender:Xitlalic Maslin, Arta Bruce, Casper Harrison in Treatment: 0 Verbal / Phone Orders: No Diagnosis Coding ICD-10 Coding Code Description E11.622 Type 2 diabetes mellitus with other skin ulcer Follow-up Appointments Return Appointment in 2 weeks. Nurse Visit: - next week Dressing Change Frequency Do not change entire dressing for one week. Skin Barriers/Peri-Wound Care TCA Cream or Ointment - mixed with lotion Wound Cleansing May shower and wash wound with soap and water. - use a cast protector. Primary Wound Dressing Wound #1 Left,Medial Lower Leg Calcium Alginate with Silver Wound #2 Left,Anterior Lower Leg Calcium Alginate with Silver Secondary Dressing ABD pad Edema Control 3 Layer Compression System - Left Lower Extremity Avoid standing for long periods of time Elevate legs to the level of the heart or above for 30 minutes daily and/or when sitting, a frequency of: - throughout the day. Exercise regularly Other: - ****Patient to bring in stockings next visit for doctor to assess the compression strength.**** Patient Medications Allergies: No Known Allergies Notifications Medication Indication Start End lidocaine DOSE topical 4 % gel - gel topical applied prior to debridement by MD. Only used in clinic. Electronic Signature(s) Signed: 01/16/2020 6:03:33 PM By: Linton Ham MD Signed: 01/16/2020 6:22:19 PM By: Deon Pilling Entered By: Deon Pilling on 01/16/2020  12:29:11 -------------------------------------------------------------------------------- Prescription 01/16/2020 Patient Name: Ashley Bloodgood, [name] Provider: Linton Ham MD Date of Birth: [birth year] NPI#: [id number] Sex: F DEA#: [id number] Phone #: [phone] License #: [id number] Patient Address: [place] [address] [place], [place] [address] [place], [place] [address] [phone] Allergies No Known Allergies Medication Medication: Route: Strength: Form: lidocaine 4 % topical gel topical 4% gel Class: TOPICAL LOCAL ANESTHETICS Dose: Frequency / Time: Indication: gel topical applied prior to debridement by MD. Only used in clinic. Number of Refills: Number of Units: 0 Generic Substitution: Start Date: End Date: One Time Use: Substitution Permitted No Note to Pharmacy: Signature(s): Date(s): Electronic Signature(s) Signed: 01/16/2020 6:03:33 PM By: Linton Ham MD Signed: 01/16/2020 6:22:19 PM By: Deon Pilling Entered By: Deon Pilling on 01/16/2020 12:29:11 --------------------------------------------------------------------------------  Problem List Details Patient Name: Date of Service: [name] 01/16/2020 10:30 AM Medical Record [id number] Patient Account Number: [id number] Date of Birth/Sex: Treating RN: 1946/04/22 (74 y.o. 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 F) Primary Care Provider: Cathlean Cower Other Clinician: Referring Provider: Treating Provider/Extender:Jeshua Ransford, Arta Bruce, Casper Harrison in Treatment: 0 Subjective Chief Complaint Information obtained from Patient 01/16/2020; patient is here for to review of 2 small wounds on the left anterior lower leg History of Present Illness (HPI) ADMISSION 01/16/20 This is a  74 year old woman with diabetes. I am not exactly sure what type of diabetes this is that she is on insulin. A brief review of [place] link did not really solve this issue. In any case she has wounds on her left anterior lower leg that have been there about 3 weeks. She has been tight using topical antibiotics and her own stocking and she became worried because these would not heal. She also has arterial disease and has I think previous bilateral common iliac artery stents. She had recent arterial studies that showed an ABI on the right at noncompressible on the left at 0.77 TBI's of 0.55 on the right and 0.52 on the left. She is here for our review of this. Past medical history; I think the patient probably has chronic venous insufficiency and wears stockings for this religiously although she did not bring them in and I am not really sure how old they are either. As noted she has diabetic PAD, hyperlipidemia, coronary artery disease, renal artery  stenosis. Patient History Information obtained from Patient. Allergies No Known Allergies Family History Diabetes - Siblings, Heart Disease - Siblings,Mother,Father, Hypertension - Mother, No family history of Cancer, Hereditary Spherocytosis, Kidney Disease, Lung Disease, Seizures, Stroke, Thyroid Problems, Tuberculosis. Social History Former smoker, Marital Status - Widowed, Alcohol Use - Never, Drug Use - No History, Caffeine Use - Daily. Medical History Eyes Patient has history of Cataracts, Glaucoma Ear/Nose/Mouth/Throat Denies history of Chronic sinus problems/congestion, Middle ear problems Hematologic/Lymphatic Patient has history of Anemia Denies history of Hemophilia, Human Immunodeficiency Virus, Lymphedema, Sickle Cell Disease Respiratory Denies history of Aspiration, Asthma, Chronic Obstructive Pulmonary Disease (COPD), Pneumothorax, Sleep Apnea, Tuberculosis Cardiovascular Patient has history of Coronary Artery Disease, Hypertension, Peripheral Arterial Disease, Peripheral Venous Disease Gastrointestinal Denies history of Cirrhosis , Colitis, Crohnoos, Hepatitis A, Hepatitis B, Hepatitis C Endocrine Patient has history of Type II Diabetes Denies history of Type I Diabetes Genitourinary Denies history of End Stage Renal Disease Immunological Denies history of Lupus Erythematosus, Raynaudoos, Scleroderma Integumentary (Skin) Denies history of History of Burn Neurologic Denies history of Dementia, Neuropathy, Quadriplegia, Paraplegia, Seizure Disorder Oncologic Denies history of Received Chemotherapy, Received Radiation Psychiatric Denies history of Anorexia/bulimia, Confinement Anxiety Patient is treated with Insulin, Oral Agents. Blood sugar is tested. Hospitalization/Surgery History - abdominal aortogram. - hysterectomy. - AV fistula placement. - angioplasty with stent. Medical And Surgical History Notes Cardiovascular carotid stenosis aortic  atherosclerosis hyperlipidemia mitral regurgitation Gastrointestinal peptic ulcer disease GERD Genitourinary chronic kidney disease Integumentary (Skin) history of wounds to lower extremities Musculoskeletal osteoporosis osteopenia Review of Systems (ROS) Constitutional Symptoms (General Health) Denies complaints or symptoms of Fatigue, Fever, Chills, Marked Weight Change. Eyes Complains or has symptoms of Vision Changes. Ear/Nose/Mouth/Throat Denies complaints or symptoms of Chronic sinus problems or rhinitis. Respiratory Denies complaints or symptoms of Chronic or frequent coughs, Shortness of Breath. Gastrointestinal Denies complaints or symptoms of Frequent diarrhea, Nausea, Vomiting. Endocrine Denies complaints or symptoms of Heat/cold intolerance. Genitourinary Denies complaints or symptoms of Frequent urination. Integumentary (Skin) Denies complaints or symptoms of Wounds. Musculoskeletal Denies complaints or symptoms of Muscle Pain, Muscle Weakness. Neurologic Denies complaints or symptoms of Numbness/parasthesias. Psychiatric Denies complaints or symptoms of Claustrophobia, Suicidal. Objective Constitutional Pulse regular and within target range for patient.. Temperature is normal and within the target range for the patient.Marland Kitchen Appears in no distress. Vitals Time Taken: 11:15 AM, Height: 61 in, Source: Stated, Weight: 230 lbs, Source: Stated, BMI: 43.5, Temperature: 97.8 F, Respiratory Rate: 19 breaths/min. Cardiovascular The pulses are  palpable.. Severe changes of chronic venous insufficiency in her left ankle and lower leg area. Chronically fibrosed skin in this area. She does not have an much in the way of edema but any degree of edema would be dangerous in her leg like this for skin breakdown.Marland Kitchen Psychiatric appears at normal baseline. General Notes: Wound exam; the patient has 2 small superficial wounds anteriorly in the anterior lower leg just above the  ankle. Tightly fibrosed skin in this area. She has had several small wounds bilaterally in her lower legs which have obviously healed looking at the scars. 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 Integumentary (Hair, Skin) Severely damaged skin in the left lower leg just above the ankle. Tightly fibrosed.. Wound #1 status is Open. Original cause of wound was Blister. The wound is located on the Left,Medial Lower Leg. The wound measures 1.4cm length x 0.7cm width x 0.1cm depth; 0.77cm^2 area and 0.077cm^3 volume. There is Fat Layer (Subcutaneous Tissue) Exposed exposed. There is no tunneling or undermining noted. There is a small amount of serosanguineous drainage noted. The wound margin is distinct with the outline attached to the wound base. There is large (67-100%) red, pink granulation within the wound bed. There is no necrotic tissue within the wound bed. Wound #2 status is Open. Original cause of wound was Blister. The wound is located on the Left,Anterior Lower Leg. The wound measures 0.6cm length x 0.5cm width x 0.1cm depth; 0.236cm^2 area and 0.024cm^3 volume. There is Fat Layer (Subcutaneous Tissue) Exposed exposed. There is no tunneling or undermining noted. There is a small amount of serosanguineous drainage noted. The wound margin is distinct with the outline attached to the wound base. There is large (67-100%) red, pink granulation within the wound bed. There is no necrotic tissue within the wound bed. 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 There  was a Three Layer Compression Therapy Procedure by Baruch Gouty, RN. Post procedure Diagnosis Wound #1: Same as Pre-Procedure Wound #2 Pre-procedure diagnosis of Wound #2 is a Venous Leg Ulcer located on the Left,Anterior Lower Leg . There was a Three Layer Compression Therapy Procedure by Baruch Gouty, RN. Post procedure Diagnosis Wound #2: Same as Pre-Procedure Plan Follow-up Appointments: Return Appointment in 2 weeks. Nurse Visit: - next week Dressing Change Frequency: Do not change entire dressing for one week. Skin Barriers/Peri-Wound Care: TCA Cream or Ointment - mixed with lotion Wound Cleansing: May shower and wash wound with soap and water. - use a cast protector. Primary Wound Dressing: Wound #1 Left,Medial Lower Leg: Calcium Alginate with Silver Wound #2 Left,Anterior Lower Leg: Calcium Alginate with Silver Secondary Dressing: ABD pad Edema Control: 3 Layer Compression System - Left Lower Extremity Avoid standing for long periods of time Elevate legs to the level of the heart or above for 30 minutes daily and/or when sitting, a frequency of: - throughout the day. Exercise regularly Other: - ****Patient to bring in stockings next visit for doctor to assess the compression strength.**** The following medication(s) was prescribed: lidocaine topical 4 % gel gel topical applied prior to debridement by MD. Only used in clinic. was prescribed  at facility 1. Primary dressing will be silver alginate ABDs under 3 layer compression. 2. I was concerned without wrapping this leg that there may be further skin breakdown. Unfortunately she did not bring her compression stockings. We will look at them next week and determine whether they are adequate for control of her swelling 3. I suspect she has severe chronic venous insufficiency she may have some degree of lymphedema as well in the lower left greater than right lower leg. 4. Although she has known PAD these look more  like venous ulcerations. She was recently reviewed by vein and vascular with regards to her previously placed common iliac artery stents 5. We will see her next week and a nurse visit. She may be healed by then the issue here will be the adequacy of her stockings. She is already well versed in the usual issues here wearing her stockings lubricating her skin she said she already knows this and has been compliant I spent 30 minutes in the review of this patient's records, face-to-face evaluation and preparation of this record Electronic Signature(s) Signed: 01/16/2020 6:03:33 PM By: Linton Ham MD Entered By: Linton Ham on 01/16/2020 13:26:44 -------------------------------------------------------------------------------- HxROS Details Patient Name: Date of Service: [name] 01/16/2020 10:30 AM Medical Record [id number] Patient Account Number: [id number] Date of Birth/Sex: Treating RN: 1946-02-07 (74 y.o. Clearnce Sorrel Primary Care Provider: Cathlean Cower Other Clinician: Referring Provider: Treating Provider/Extender:Georgianne Gritz, Arta Bruce, Casper Harrison in Treatment: 0 Information Obtained From Patient Constitutional Symptoms (General Health) Complaints and Symptoms: Negative for: Fatigue; Fever; Chills; Marked Weight Change Eyes Complaints and Symptoms: Positive for: Vision Changes Medical History: Positive for: Cataracts; Glaucoma Ear/Nose/Mouth/Throat Complaints and Symptoms: Negative for: Chronic sinus problems or rhinitis Medical History: Negative for: Chronic sinus problems/congestion; Middle ear problems Respiratory Complaints and Symptoms: Negative for: Chronic or frequent coughs; Shortness of Breath Medical History: Negative for: Aspiration; Asthma; Chronic Obstructive Pulmonary Disease (COPD); Pneumothorax; Sleep Apnea; Tuberculosis Gastrointestinal Complaints and Symptoms: Negative for: Frequent diarrhea; Nausea; Vomiting Medical  History: Negative for: Cirrhosis ; Colitis; Crohns; Hepatitis A; Hepatitis B; Hepatitis C Past Medical History Notes: peptic ulcer disease GERD Endocrine Complaints and Symptoms: Negative for: Heat/cold intolerance Medical History: Positive for: Type II Diabetes Negative for: Type I Diabetes Time with diabetes: since 1996 Treated with: Insulin, Oral agents Blood sugar tested every day: Yes Tested : Genitourinary Complaints and Symptoms: Negative for: Frequent urination Medical History: Negative for: End Stage Renal Disease Past Medical History Notes: chronic kidney disease Integumentary (Skin) Complaints and Symptoms: Negative for: Wounds Medical History: Negative for: History of Burn Past Medical History Notes: history of wounds to lower extremities Musculoskeletal Complaints and Symptoms: Negative for: Muscle Pain; Muscle Weakness Medical History: Past Medical History Notes: osteoporosis osteopenia Neurologic Complaints and Symptoms: Negative for: Numbness/parasthesias Medical History: Negative for: Dementia; Neuropathy; Quadriplegia; Paraplegia; Seizure Disorder Psychiatric Complaints and Symptoms: Negative for: Claustrophobia; Suicidal Medical History: Negative for: Anorexia/bulimia; Confinement Anxiety Hematologic/Lymphatic Medical History: Positive for: Anemia Negative for: Hemophilia; Human Immunodeficiency Virus; Lymphedema; Sickle Cell Disease Cardiovascular Medical History: Positive for: Coronary Artery Disease; Hypertension; Peripheral Arterial Disease; Peripheral Venous Disease Past Medical History Notes: carotid stenosis aortic atherosclerosis hyperlipidemia mitral regurgitation Immunological Medical History: Negative for: Lupus Erythematosus; Raynauds; Scleroderma Oncologic Medical History: Negative for: Received Chemotherapy; Received Radiation HBO Extended History Items Eyes: Eyes: Cataracts Glaucoma Immunizations Pneumococcal  Vaccine: Received Pneumococcal Vaccination: No Implantable Devices None Hospitalization / Surgery History Type of Hospitalization/Surgery abdominal aortogram hysterectomy AV fistula placement angioplasty with stent Family and Social  History Cancer: No; Diabetes: Yes - Siblings; Heart Disease: Yes - Siblings,Mother,Father; Hereditary Spherocytosis: No; Hypertension: Yes - Mother; Kidney Disease: No; Lung Disease: No; Seizures: No; Stroke: No; Thyroid Problems: No; Tuberculosis: No; Former smoker; Marital Status - Widowed; Alcohol Use: Never; Drug Use: No History; Caffeine Use: Daily; Financial Concerns: No; Food, Clothing or Shelter Needs: No; Support System Lacking: No; Transportation Concerns: No Electronic Signature(s) Signed: 01/16/2020 6:03:33 PM By: Linton Ham MD Signed: 01/16/2020 6:20:13 PM By: Kela Millin Entered By: Kela Millin on 01/16/2020 11:32:27 -------------------------------------------------------------------------------- SuperBill Details Patient Name: Date of Service: [name] 01/16/2020 Medical Record [id number] Patient Account Number: [id number] Date of Birth/Sex: Treating RN: Dec 13, 1945 (74 y.o. Helene Shoe, Meta.Reding Primary Care Provider: Cathlean Cower Other Clinician: Referring Provider: Treating Provider/Extender:Jaekwon Mcclune, Arta Bruce, Casper Harrison in Treatment: 0 Diagnosis Coding ICD-10 Codes Code Description E11.622 Type 2 diabetes mellitus with other skin ulcer I87.322 Chronic venous hypertension (idiopathic) with inflammation of left lower extremity E11.51 Type 2 diabetes mellitus with diabetic peripheral angiopathy without gangrene L97.821 Non-pressure chronic ulcer of other part of left lower leg limited to breakdown of skin Facility Procedures The patient participates with Medicare or their insurance follows the Medicare Facility Guidelines: CPT4 Code Description Modifier Quantity [id number] [place] VISIT-LEV 3 EST  PT 1 The patient participates with Medicare or their insurance follows the Medicare Facility Guidelines: [phone] (Facility Use Only) [phone] - [place] [facility] Physician Procedures CPT4 Code Description: [id number] [place] PHYS LEVEL 3 NEW PT ICD-10 Diagnosis Description E11.622 Type 2 diabetes mellitus with other skin ulcer I87.322 Chronic venous hypertension (idiopathic) with inflammation extremity E11.51 Type 2 diabetes mellitus with  diabetic peripheral angiopat L97.821 Non-pressure chronic ulcer of other part of left lower leg skin Modifier: of left lower hy without gangrene limited to breakdo Quantity: 1 wn of Electronic Signature(s) Signed: 01/16/2020 6:03:33 PM By: Linton Ham MD Entered By: Linton Ham on 01/16/2020 13:27:01

## 2020-01-16 NOTE — Progress Notes (Signed)
SHATASIA, CUTSHAW ([id number]) Visit Report for 01/16/2020 Abuse/Suicide Risk Screen Details Patient Name: Date of Service: [name], [name] 01/16/2020 10:30 AM Medical Record [id number] Patient Account Number: [id number] Date of Birth/Sex: Treating RN: 07/29/1946 (74 y.o. Clearnce Sorrel Primary Care Alieyah Spader: Cathlean Cower Other Clinician: Referring Kinleigh Nault: Treating Vinay Ertl/Extender:Robson, Arta Bruce, Casper Harrison in Treatment: 0 Abuse/Suicide Risk Screen Items Answer ABUSE RISK SCREEN: Has anyone close to you tried to hurt or harm you recentlyo No Do you feel uncomfortable with anyone in your familyo No Has anyone forced you do things that you didnt want to doo No Electronic Signature(s) Signed: 01/16/2020 6:20:13 PM By: Kela Millin Entered By: Kela Millin on 01/16/2020 11:32:42 -------------------------------------------------------------------------------- Activities of Daily Living Details Patient Name: Date of Service: DONYELLE, ENYEART 01/16/2020 10:30 AM Medical Record [id number] Patient Account Number: [id number] Date of Birth/Sex: Treating RN: [birth year] (74 y.o. Clearnce Sorrel Primary Care Merlyn Conley: Cathlean Cower Other Clinician: Referring Maybel Dambrosio: Treating Chee Dimon/Extender:Robson, Arta Bruce, Casper Harrison in Treatment: 0 Activities of Daily Living Items Answer Activities of Daily Living (Please select one for each item) Drive Automobile Not Able Take Medications Completely Able Use Telephone Completely Able Care for Appearance Completely Able Use Toilet Completely Able Bath / Shower Completely Able Dress Self Completely Able Feed Self Completely Able Walk Completely Able Get In / Out Bed Completely Able Housework Completely Able Prepare Meals Completely Able Handle Money Completely Able Shop for Self Completely Able Electronic Signature(s) Signed: 01/16/2020 6:20:13 PM By: Kela Millin Entered By:  Kela Millin on 01/16/2020 11:33:15 -------------------------------------------------------------------------------- Education Screening Details Patient Name: Date of Service: [name] 01/16/2020 10:30 AM Medical Record [id number] Patient Account Number: [id number] Date of Birth/Sex: Treating RN: 01-15-1946 (74 y.o. Clearnce Sorrel Primary Care Aariyana Manz: Cathlean Cower Other Clinician: Referring Cheston Coury: Treating Purnell Daigle/Extender:Robson, Arta Bruce, Casper Harrison in Treatment: 0 Primary Learner Assessed: Patient Learning Preferences/Education Level/Primary Language Learning Preference: Explanation Preferred Language: English Cognitive Barrier Language Barrier: No Translator Needed: No Memory Deficit: No Emotional Barrier: No Cultural/Religious Beliefs Affecting Medical Care: No Physical Barrier Impaired Vision: No Impaired Hearing: No Decreased Hand dexterity: No Knowledge/Comprehension Knowledge Level: High Comprehension Level: High Ability to understand written High instructions: Ability to understand verbal High instructions: Motivation Anxiety Level: Calm Cooperation: Cooperative Education Importance: Acknowledges Need Interest in Health Problems: Asks Questions Perception: Coherent Willingness to Engage in Self- High Management Activities: Readiness to Engage in Self- High Management Activities: Electronic Signature(s) Signed: 01/16/2020 6:20:13 PM By: Kela Millin Entered By: Kela Millin on 01/16/2020 11:33:34 -------------------------------------------------------------------------------- Fall Risk Assessment Details Patient Name: Date of Service: [name] 01/16/2020 10:30 AM Medical Record [id number] Patient Account Number: [id number] Date of Birth/Sex: Treating RN: May 15, 1946 (74 y.o. Clearnce Sorrel Primary Care Adie Vilar: Cathlean Cower Other Clinician: Referring Haylie Mccutcheon: Treating  Lorice Lafave/Extender:Robson, Arta Bruce, Casper Harrison in Treatment: 0 Fall Risk Assessment Items Have you had 2 or more falls in the last 12 monthso 0 No Have you had any fall that resulted in injury in the last 12 monthso 0 No FALLS RISK SCREEN History of falling - immediate or within 3 months 0 No Secondary diagnosis (Do you have 2 or more medical diagnoseso) 15 Yes Ambulatory aid None/bed rest/wheelchair/nurse 0 No Crutches/cane/walker 15 Yes Furniture 0 No Intravenous therapy Access/Saline/Heparin Lock 0 No Weak (short steps with or without shuffle, stooped but able to lift head 10 Yes while walking, may seek support from furniture) Impaired (short steps with shuffle, may have difficulty arising from chair, 0 No head down, impaired  balance) Mental Status Oriented to own ability 0 Yes Overestimates or forgets limitations 0 No Risk Level: Medium Risk Score: 40 Electronic Signature(s) Signed: 01/16/2020 6:20:13 PM By: Kela Millin Entered By: Kela Millin on 01/16/2020 11:33:56 -------------------------------------------------------------------------------- Foot Assessment Details Patient Name: Date of Service: [name] 01/16/2020 10:30 AM Medical Record [id number] Patient Account Number: [id number] Date of Birth/Sex: Treating RN: [birth year] (74 y.o. Clearnce Sorrel Primary Care Libni Fusaro: Cathlean Cower Other Clinician: Referring Mylee Falin: Treating Shaquile Lutze/Extender:Robson, Arta Bruce, Casper Harrison in Treatment: 0 Foot Assessment Items Site Locations + = Sensation present, - = Sensation absent, C = Callus, U = Ulcer R = Redness, W = Warmth, M = Maceration, PU = Pre-ulcerative lesion F = Fissure, S = Swelling, D = Dryness Assessment Right: Left: Other Deformity: No No Prior Foot Ulcer: No No Prior Amputation: No No Charcot Joint: No No Ambulatory Status: Ambulatory With Help Assistance Device: Cane Gait: Steady Electronic  Signature(s) Signed: 01/16/2020 6:20:13 PM By: Kela Millin Entered By: Kela Millin on 01/16/2020 11:34:18 -------------------------------------------------------------------------------- Nutrition Risk Screening Details Patient Name: Date of Service: SERA, HITSMAN 01/16/2020 10:30 AM Medical Record [id number] Patient Account Number: [id number] Date of Birth/Sex: Treating RN: 1945/11/04 (74 y.o. Clearnce Sorrel Primary Care Sonita Michiels: Cathlean Cower Other Clinician: Referring Ronin Crager: Treating Belle Charlie/Extender:Robson, Arta Bruce, Casper Harrison in Treatment: 0 Height (in): 61 Weight (lbs): 230 Body Mass Index (BMI): 43.5 Nutrition Risk Screening Items Score Screening NUTRITION RISK SCREEN: I have an illness or condition that made me change the kind and/or 0 No amount of food I eat I eat fewer than two meals per day 0 No I eat few fruits and vegetables, or milk products 0 No I have three or more drinks of beer, liquor or wine almost every day 0 No I have tooth or mouth problems that make it hard for me to eat 0 No I don't always have enough money to buy the food I need 0 No I eat alone most of the time 0 No I take three or more different prescribed or over-the-counter drugs a day 1 Yes 0 No Without wanting to, I have lost or gained 10 pounds in the last six months I am not always physically able to shop, cook and/or feed myself 0 No Nutrition Protocols Good Risk Protocol Provide education on Moderate Risk Protocol 0 nutrition High Risk Proctocol Risk Level: Good Risk Score: 1 Electronic Signature(s) Signed: 01/16/2020 6:20:13 PM By: Kela Millin Entered By: Kela Millin on 01/16/2020 11:34:07

## 2020-01-16 NOTE — Progress Notes (Signed)
MERCIA, DOWE ([id number]) Visit Report for 01/16/2020 Allergy List Details Patient Name: Date of Service: VERLEEN, STUCKEY 01/16/2020 10:30 AM Medical Record [id number] Patient Account Number: [id number] Date of Birth/Sex: Treating RN: February 10, 1946 (74 y.o. Clearnce Sorrel Primary Care Kaylah Chiasson: Cathlean Cower Other Clinician: Referring Nolene Rocks: Treating Ailin Rochford/Extender:Robson, Arta Bruce, Casper Harrison in Treatment: 0 Allergies Active Allergies No Known Allergies Allergy Notes Electronic Signature(s) Signed: 01/16/2020 6:20:13 PM By: Kela Millin Entered By: Kela Millin on 01/16/2020 11:22:43 -------------------------------------------------------------------------------- Arrival Information Details Patient Name: Date of Service: [name] 01/16/2020 10:30 AM Medical Record [id number] Patient Account Number: [id number] Date of Birth/Sex: Treating RN: May 08, 1946 (74 y.o. Clearnce Sorrel Primary Care Jiovanny Burdell: Cathlean Cower Other Clinician: Referring Abie Killian: Treating Koni Kannan/Extender:Robson, Arta Bruce, Casper Harrison in Treatment: 0 Visit Information Patient Arrived: Kasandra Knudsen Arrival Time: 11:14 Accompanied By: family member Transfer Assistance: None Patient Identification Verified: Yes Secondary Verification Process Yes Completed: Patient Has Alerts: Yes Patient Alerts: Right ABI: non comp Right TBI: 0.55 Left ABI: 0.76 Left TBI: 0.52 Electronic Signature(s) Signed: 01/16/2020 6:20:13 PM By: Kela Millin Entered By: Kela Millin on 01/16/2020 11:18:01 -------------------------------------------------------------------------------- Clinic Level of Care Assessment Details Patient Name: Date of Service: LILLYMAE, DUET 01/16/2020 10:30 AM Medical Record [id number] Patient Account Number: [id number] Date of Birth/Sex: Treating RN: [birth year] (74 y.o. Debby Bud Primary Care Shaneil Yazdi: Cathlean Cower Other Clinician: Referring Shirlee Whitmire: Treating Manases Etchison/Extender:Robson, Arta Bruce, Casper Harrison in Treatment: 0 Clinic Level of Care Assessment Items TOOL 1 Quantity Score X - Use when EandM and Procedure is performed on INITIAL visit 1 0 ASSESSMENTS - Nursing Assessment / Reassessment X - General Physical Exam (combine w/ comprehensive assessment (listed just below) 1 20 when performed on new pt. evals) X - Comprehensive Assessment (HX, ROS, Risk Assessments, Wounds Hx, etc.) 1 25 ASSESSMENTS - Wound and Skin Assessment / Reassessment X - Dermatologic / Skin Assessment (not related to wound area) 1 10 ASSESSMENTS - Ostomy and/or Continence Assessment and Care '[]'$  - Incontinence Assessment and Management 0 '[]'$  - Ostomy Care Assessment and Management (repouching, etc.) 0 PROCESS - Coordination of Care '[]'$  - Simple Patient / Family Education for ongoing care 0 X - Complex (extensive) Patient / Family Education for ongoing care 1 20 X - Staff obtains Programmer, systems, Records, Test Results / Process Orders 1 10 '[]'$  - Staff telephones HHA, Nursing Homes / Clarify orders / etc 0 '[]'$  - Routine Transfer to another Facility (non-emergent condition) 0 '[]'$  - Routine Hospital Admission (non-emergent condition) 0 X - New Admissions / Biomedical engineer / Ordering NPWT, Apligraf, etc. 1 15 '[]'$  - Emergency Hospital Admission (emergent condition) 0 PROCESS - Special Needs '[]'$  - Pediatric / Minor Patient Management 0 '[]'$  - Isolation Patient Management 0 '[]'$  - Hearing / Language / Visual special needs 0 '[]'$  - Assessment of Community assistance (transportation, D/C planning, etc.) 0 '[]'$  - Additional assistance / Altered mentation 0 '[]'$  - Support Surface(s) Assessment (bed, cushion, seat, etc.) 0 INTERVENTIONS - Miscellaneous '[]'$  - External ear exam 0 '[]'$  - Patient Transfer (multiple staff / Civil Service fast streamer / Similar devices) 0 '[]'$  - Simple Staple / Suture removal (25 or less) 0 '[]'$  - Complex Staple / Suture  removal (26 or more) 0 '[]'$  - Hypo/Hyperglycemic Management (do not check if billed separately) 0 '[]'$  - Ankle / Brachial Index (ABI) - do not check if billed separately 0 Has the patient been seen at the hospital within the last three years: Yes Total Score: 100 Level Of Care: New/Established -  Level 3 Electronic Signature(s) Signed: 01/16/2020 6:22:19 PM By: Deon Pilling Entered By: Deon Pilling on 01/16/2020 12:26:39 -------------------------------------------------------------------------------- Compression Therapy Details Patient Name: Date of Service: KEIASHA, DIEP 01/16/2020 10:30 AM Medical Record [id number] Patient Account Number: [id number] Date of Birth/Sex: Treating RN: [birth year] (74 y.o. Debby Bud Primary Care Boston Cookson: Cathlean Cower Other Clinician: Referring Amillya Chavira: Treating Tela Kotecki/Extender:Robson, Arta Bruce, Casper Harrison in Treatment: 0 Compression Therapy Performed for Wound Wound #1 Left,Medial Lower Leg Assessment: Performed By: Clinician Baruch Gouty, RN Compression Type: Three Layer Post Procedure Diagnosis Same as Pre-procedure Electronic Signature(s) Signed: 01/16/2020 6:22:19 PM By: Deon Pilling Entered By: Deon Pilling on 01/16/2020 12:26:17 -------------------------------------------------------------------------------- Compression Therapy Details Patient Name: Date of Service: LINNEA, TODISCO 01/16/2020 10:30 AM Medical Record [id number] Patient Account Number: [id number] Date of Birth/Sex: Treating RN: 04-Apr-1946 (74 y.o. Debby Bud Primary Care Yatzari Jonsson: Cathlean Cower Other Clinician: Referring Dmarius Reeder: Treating Noella Kipnis/Extender:Robson, Arta Bruce, Casper Harrison in Treatment: 0 Compression Therapy Performed for Wound Wound #2 Left,Anterior Lower Leg Assessment: Performed By: Clinician Baruch Gouty, RN Compression Type: Three Layer Post Procedure Diagnosis Same as Pre-procedure Electronic  Signature(s) Signed: 01/16/2020 6:22:19 PM By: Deon Pilling Entered By: Deon Pilling on 01/16/2020 12:26:17 -------------------------------------------------------------------------------- Encounter Discharge Information Details Patient Name: Date of Service: [name] 01/16/2020 10:30 AM Medical Record [id number] Patient Account Number: [id number] Date of Birth/Sex: Treating RN: [birth year] (74 y.o. Elam Dutch Primary Care Mccauley Diehl: Cathlean Cower Other Clinician: Referring Dalbert Stillings: Treating Areeb Corron/Extender:Robson, Arta Bruce, Casper Harrison in Treatment: 0 Encounter Discharge Information Items Discharge Condition: Stable Ambulatory Status: Cane Discharge Destination: Home Transportation: Private Auto Accompanied By: self Schedule Follow-up Appointment: Yes Clinical Summary of Care: Patient Declined Electronic Signature(s) Signed: 01/16/2020 5:37:17 PM By: Baruch Gouty RN, BSN Entered By: Baruch Gouty on 01/16/2020 12:41:09 -------------------------------------------------------------------------------- Lower Extremity Assessment Details Patient Name: Date of Service: JENEVIE, CASSTEVENS 01/16/2020 10:30 AM Medical Record [id number] Patient Account Number: [id number] Date of Birth/Sex: Treating RN: 06-15-1946 (74 y.o. Clearnce Sorrel Primary Care Shonica Weier: Cathlean Cower Other Clinician: Referring Cortavius Montesinos: Treating Kenta Laster/Extender:Robson, Arta Bruce, Casper Harrison in Treatment: 0 Edema Assessment Assessed: [Left: No] [Right: No] Edema: [Left: N] [Right: o] Calf Left: Right: Point of Measurement: 34 cm From Medial Instep 36 cm cm Ankle Left: Right: Point of Measurement: 15 cm From Medial Instep 20.5 cm cm Vascular Assessment Pulses: Dorsalis Pedis Palpable: [Left:Yes] Electronic Signature(s) Signed: 01/16/2020 6:20:13 PM By: Kela Millin Entered By: Kela Millin on 01/16/2020  11:35:38 -------------------------------------------------------------------------------- Multi Wound Chart Details Patient Name: Date of Service: [name] 01/16/2020 10:30 AM Medical Record [id number] Patient Account Number: [id number] Date of Birth/Sex: Treating RN: 1946/01/14 (74 y.o. F) Primary Care Ruthe Roemer: Cathlean Cower Other Clinician: Referring Phelan Goers: Treating Keitra Carusone/Extender:Robson, Arta Bruce, Casper Harrison in Treatment: 0 Vital Signs Height(in): 61 Pulse(bpm): Weight(lbs): 230 Blood Pressure(mmHg): Body Mass Index(BMI): 43 Temperature(F): 97.8 Respiratory 19 Rate(breaths/min): Photos: [1:No Photos] [2:No Photos] [N/A:N/A] Wound Location: [1:Left Lower Leg - Medial] [2:Left Lower Leg - Anterior] [N/A:N/A] Wounding Event: [1:Blister] [2:Blister] [N/A:N/A] Primary Etiology: [1:Venous Leg Ulcer] [2:Venous Leg Ulcer] [N/A:N/A] Comorbid History: [1:Cataracts, Glaucoma, Anemia, Coronary Artery Anemia, Coronary Artery Disease, Hypertension, Peripheral Arterial Disease, Peripheral Arterial Disease, Peripheral Venous Disease, Peripheral Venous Disease, Type II Diabetes]  [2:Cataracts, Glaucoma, Disease, Hypertension, Type II Diabetes] [N/A:N/A] Date Acquired: [1:12/23/2019] [2:12/23/2019] [N/A:N/A] Weeks of Treatment: [1:0] [2:0] [N/A:N/A] Wound Status: [1:Open] [2:Open] [N/A:N/A] Measurements L x W x D 1.4x0.7x0.1 [2:0.6x0.5x0.1] [N/A:N/A] (cm) Area (cm) : [1:0.77] [4:1.660] [N/A:N/A] Volume (cm) : [1:0.077] [2:0.024] [N/A:N/A] Classification: [1:Full Thickness  Without Exposed Support Structures Exposed Support Structures] [2:Full Thickness Without] [N/A:N/A] Exudate Amount: [1:Small] [2:Small] [N/A:N/A] Exudate Type: [1:Serosanguineous] [2:Serosanguineous] [N/A:N/A] Exudate Color: [1:red, brown] [2:red, brown] [N/A:N/A] Wound Margin: [1:Distinct, outline attached Distinct, outline attached N/A] Granulation Amount: [1:Large (67-100%)] [2:Large (67-100%)]  [N/A:N/A] Granulation Quality: [1:Red, Pink] [2:Red, Pink] [N/A:N/A] Necrotic Amount: [1:None Present (0%)] [2:None Present (0%)] [N/A:N/A] Exposed Structures: [1:Fat Layer (Subcutaneous Fat Layer (Subcutaneous N/A Tissue) Exposed: Yes Fascia: No Tendon: No Muscle: No Joint: No Bone: No] [2:Tissue) Exposed: Yes Fascia: No Tendon: No Muscle: No Joint: No Bone: No] Epithelialization: [1:None] [2:None Compression Therapy] [N/A:N/A N/A] Treatment Notes Wound #1 (Left, Medial Lower Leg) 2. Periwound Care Moisturizing lotion TCA Cream 3. Primary Dressing Applied Calcium Alginate Ag 4. Secondary Dressing Dry Gauze 6. Support Layer Applied 3 layer compression wrap Wound #2 (Left, Anterior Lower Leg) 2. Periwound Care Moisturizing lotion TCA Cream 3. Primary Dressing Applied Calcium Alginate Ag 4. Secondary Dressing Dry Gauze 6. Support Layer Applied 3 layer compression Water quality scientist) Signed: 01/16/2020 6:03:33 PM By: Linton Ham MD Entered By: Linton Ham on 01/16/2020 13:15:26 -------------------------------------------------------------------------------- [place] Details Patient Name: Date of Service: [name] 01/16/2020 10:30 AM Medical Record [id number] Patient Account Number: [id number] Date of Birth/Sex: Treating RN: Mar 22, 1946 (74 y.o. Helene Shoe, Meta.Reding Primary Care Ereka Brau: Cathlean Cower Other Clinician: Referring Edna Grover: Treating Tibor Lemmons/Extender:Robson, Arta Bruce, Casper Harrison in Treatment: 0 Active Inactive Orientation to the Wound Care Program Nursing Diagnoses: Knowledge deficit related to the wound healing center program Goals: Patient/caregiver will verbalize understanding of the [place] Date Initiated: 01/16/2020 Target Resolution Date: 01/31/2020 Goal Status: Active Interventions: Provide education on orientation to the wound center Notes: Pain, Acute or Chronic Nursing  Diagnoses: Pain, acute or chronic: actual or potential Potential alteration in comfort, pain Goals: Patient will verbalize adequate pain control and receive pain control interventions during procedures as needed Date Initiated: 01/16/2020 Target Resolution Date: 01/31/2020 Goal Status: Active Patient/caregiver will verbalize comfort level met Date Initiated: 01/16/2020 Target Resolution Date: 01/31/2020 Goal Status: Active Interventions: Encourage patient to take pain medications as prescribed Provide education on pain management Treatment Activities: Administer pain control measures as ordered : 01/16/2020 Notes: Wound/Skin Impairment Nursing Diagnoses: Knowledge deficit related to ulceration/compromised skin integrity Goals: Patient/caregiver will verbalize understanding of skin care regimen Date Initiated: 01/16/2020 Target Resolution Date: 01/31/2020 Goal Status: Active Interventions: Assess patient/caregiver ability to perform ulcer/skin care regimen upon admission and as needed Assess ulceration(s) every visit Provide education on ulcer and skin care Treatment Activities: Skin care regimen initiated : 01/16/2020 Topical wound management initiated : 01/16/2020 Notes: Electronic Signature(s) Signed: 01/16/2020 6:22:19 PM By: Deon Pilling Entered By: Deon Pilling on 01/16/2020 11:51:35 -------------------------------------------------------------------------------- Pain Assessment Details Patient Name: Date of Service: KAURI, GARSON 01/16/2020 10:30 AM Medical Record [id number] Patient Account Number: [id number] Date of Birth/Sex: Treating RN: 1946-08-16 (74 y.o. Clearnce Sorrel Primary Care Shanicqua Coldren: Cathlean Cower Other Clinician: Referring Briyonna Omara: Treating Nehal Witting/Extender:Robson, Arta Bruce, Casper Harrison in Treatment: 0 Active Problems Location of Pain Severity and Description of Pain Patient Has Paino No Site Locations Pain Management and  Medication Current Pain Management: Electronic Signature(s) Signed: 01/16/2020 6:20:13 PM By: Kela Millin Entered By: Kela Millin on 01/16/2020 11:45:22 -------------------------------------------------------------------------------- Patient/Caregiver Education Details Patient Name: Date of Service: [name] 3/25/2021andnbsp10:30 AM Medical Record [id number] Patient Account Number: [id number] Date of Birth/Gender: Treating RN: [birth year] (74 y.o. Helene Shoe, Tammi Klippel Primary Care Physician: Cathlean Cower Other Clinician: Referring Physician: Treating Physician/Extender:Robson, Arta Bruce, Casper Harrison  in Treatment: 0 Education Assessment Education Provided To: Patient Education Topics Provided Pain: Handouts: A Guide to Pain Control Methods: Explain/Verbal, Printed Responses: Reinforcements needed Welcome To [facility]: Handouts: Welcome To [facility] Methods: Explain/Verbal, Printed Responses: Reinforcements needed Wound/Skin Impairment: Handouts: Caring for Your Ulcer, Skin Care Do's and Dont's Methods: Explain/Verbal, Printed Responses: Reinforcements needed Electronic Signature(s) Signed: 01/16/2020 6:22:19 PM By: Deon Pilling Entered By: Deon Pilling on 01/16/2020 11:52:53 -------------------------------------------------------------------------------- Wound Assessment Details Patient Name: Date of Service: YESMIN, MUTCH 01/16/2020 10:30 AM Medical Record [id number] Patient Account Number: [id number] Date of Birth/Sex: Treating RN: [birth year] (74 y.o. Clearnce Sorrel Primary Care Karlena Luebke: Cathlean Cower Other Clinician: Referring Nirel Babler: Treating Ladoris Lythgoe/Extender:Robson, Arta Bruce, Casper Harrison in Treatment: 0 Wound Status Wound Number: 1 Primary Venous Leg Ulcer Etiology: Wound Location: Left Lower Leg - Medial Wound Open Wounding Event: Blister Status: Date Acquired: 12/23/2019 Comorbid  Cataracts, Glaucoma, Anemia, Coronary Weeks Of Treatment: 0 History: Artery Disease, Hypertension, Peripheral Clustered Wound: No Arterial Disease, Peripheral Venous Disease, Type II Diabetes Wound Measurements Length: (cm) 1.4 % Reduct Width: (cm) 0.7 % Reduct Depth: (cm) 0.1 Epitheli Area: (cm) 0.77 Tunneli Volume: (cm) 0.077 Undermi Wound Description Classification: Full Thickness Without Exposed Support Structures Wound Distinct, outline attached Margin: Exudate Small Amount: Exudate Serosanguineous Type: Exudate red, brown Color: Wound Bed Granulation Amount: Large (67-100%) Granulation Quality: Red, Pink Necrotic Amount: None Present (0%) Treatment Notes Wound #1 (Left, Medial Lower Leg) 2. Periwound Care Moisturizing lotion TCA Cream 3. Primary Dressing Applied Calcium Alginate Ag 4. Secondary Dressing Dry Gauze 6. Support Layer Applied 3 layer compression wrap Foul Odor After Cleansing: No Slough/Fibrino No Exposed Structure Fascia Exposed: No Fat Layer (Subcutaneous Tissue) Exposed: Yes Tendon Exposed: No Muscle Exposed: No Joint Exposed: No Bone Exposed: No ion in Area: ion in Volume: alization: None ng: No ning: No Electronic Signature(s) Signed: 01/16/2020 6:20:13 PM By: Kela Millin Entered By: Kela Millin on 01/16/2020 11:44:09 -------------------------------------------------------------------------------- Wound Assessment Details Patient Name: Date of Service: MAEVEN, MCDOUGALL 01/16/2020 10:30 AM Medical Record [id number] Patient Account Number: [id number] Date of Birth/Sex: Treating RN: Oct 20, 1946 (74 y.o. Clearnce Sorrel Primary Care Nayah Lukens: Cathlean Cower Other Clinician: Referring Marlea Gambill: Treating Wyndell Cardiff/Extender:Robson, Arta Bruce, Casper Harrison in Treatment: 0 Wound Status Wound Number: 2 Primary Venous Leg Ulcer Etiology: Wound Location: Left Lower Leg - Anterior Wound Open Wounding Event:  Blister Status: Date Acquired: 12/23/2019 Comorbid Cataracts, Glaucoma, Anemia, Coronary Weeks Of Treatment: 0 History: Artery Disease, Hypertension, Peripheral Clustered Wound: No Arterial Disease, Peripheral Venous Disease, Type II Diabetes Wound Measurements Length: (cm) 0.6 % Reduct Width: (cm) 0.5 % Reduct Depth: (cm) 0.1 Epitheli Area: (cm) 0.236 Tunneli Volume: (cm) 0.024 Undermi Wound Description Classification: Full Thickness Without Exposed Support Foul Od Structures Slough/ Wound Distinct, outline attached Margin: Exudate Small Amount: Exudate Serosanguineous Type: Exudate red, brown Color: Wound Bed Granulation Amount: Large (67-100%) Granulation Quality: Red, Pink Fascia Expo Necrotic Amount: None Present (0%) Fat Layer ( Tendon Expo Muscle Expo Joint Expos Bone Expose or After Cleansing: No Fibrino No Exposed Structure sed: No Subcutaneous Tissue) Exposed: Yes sed: No sed: No ed: No d: No ion in Area: ion in Volume: alization: None ng: No ning: No Treatment Notes Wound #2 (Left, Anterior Lower Leg) 2. Periwound Care Moisturizing lotion TCA Cream 3. Primary Dressing Applied Calcium Alginate Ag 4. Secondary Dressing Dry Gauze 6. Support Layer Applied 3 layer compression wrap Electronic Signature(s) Signed: 01/16/2020 6:20:13 PM By: Kela Millin Entered By: Verita Schneiders,  Shannon on 01/16/2020 11:45:10 -------------------------------------------------------------------------------- Vitals Details Patient Name: Date of Service: CARIANNE, TAIRA 01/16/2020 10:30 AM Medical Record [id number] Patient Account Number: [id number] Date of Birth/Sex: Treating RN: [birth year] (74 y.o. Clearnce Sorrel Primary Care Amee Boothe: Cathlean Cower Other Clinician: Referring Carmelo Reidel: Treating Kien Mirsky/Extender:Robson, Arta Bruce, Casper Harrison in Treatment: 0 Vital Signs Time Taken: 11:15 Temperature (F): 97.8 Height (in): 61 Respiratory  Rate (breaths/min): 19 Source: Stated Reference Range: 80 - 120 mg / dl Weight (lbs): 230 Source: Stated Body Mass Index (BMI): 43.5 Electronic Signature(s) Signed: 01/16/2020 6:20:13 PM By: Kela Millin Entered By: Kela Millin on 01/16/2020 11:20:07

## 2020-01-19 ENCOUNTER — Other Ambulatory Visit: Payer: Self-pay | Admitting: Internal Medicine

## 2020-01-19 NOTE — Telephone Encounter (Signed)
Please refill as per office routine med refill policy (all routine meds refilled for 3 mo or monthly per pt preference up to one year from last visit, then month to month grace period for 3 mo, then further med refills will have to be denied)  

## 2020-01-21 ENCOUNTER — Other Ambulatory Visit: Payer: Self-pay | Admitting: Internal Medicine

## 2020-01-23 ENCOUNTER — Encounter (HOSPITAL_BASED_OUTPATIENT_CLINIC_OR_DEPARTMENT_OTHER): Payer: Medicare Other | Admitting: Internal Medicine

## 2020-01-24 ENCOUNTER — Encounter (HOSPITAL_BASED_OUTPATIENT_CLINIC_OR_DEPARTMENT_OTHER): Payer: Medicare Other | Attending: Internal Medicine | Admitting: Internal Medicine

## 2020-01-24 ENCOUNTER — Other Ambulatory Visit: Payer: Self-pay

## 2020-01-24 DIAGNOSIS — E11622 Type 2 diabetes mellitus with other skin ulcer: Secondary | ICD-10-CM | POA: Diagnosis not present

## 2020-01-24 DIAGNOSIS — L97821 Non-pressure chronic ulcer of other part of left lower leg limited to breakdown of skin: Secondary | ICD-10-CM | POA: Diagnosis not present

## 2020-01-24 DIAGNOSIS — E1151 Type 2 diabetes mellitus with diabetic peripheral angiopathy without gangrene: Secondary | ICD-10-CM | POA: Diagnosis not present

## 2020-01-24 DIAGNOSIS — I890 Lymphedema, not elsewhere classified: Secondary | ICD-10-CM | POA: Diagnosis not present

## 2020-01-24 DIAGNOSIS — I10 Essential (primary) hypertension: Secondary | ICD-10-CM | POA: Insufficient documentation

## 2020-01-24 DIAGNOSIS — I87322 Chronic venous hypertension (idiopathic) with inflammation of left lower extremity: Secondary | ICD-10-CM | POA: Insufficient documentation

## 2020-01-24 NOTE — Progress Notes (Signed)
Ashley, [name] ([id number]) Visit Report for 01/24/2020 Arrival Information Details Patient Name: Date of Service: [name], [name] 01/24/2020 9:45 AM Medical Record [id number] Patient Account Number: [id number] Date of Birth/Sex: Treating RN: [birth year] (74 y.o. [name] Primary Care Earland Reish: Cathlean Cower Other Clinician: Referring Brenan Modesto: Treating Marshal Schrecengost/Extender:Robson, Arta Bruce, Casper Harrison in Treatment: 1 Visit Information History Since Last Visit Added or deleted any medications: No Patient Arrived: [name] Any new allergies or adverse reactions: No Arrival Time: 10:24 Had a fall or experienced change in No Accompanied By: alone activities of daily living that may affect Transfer Assistance: None risk of falls: Patient Identification Verified: Yes Signs or symptoms of abuse/neglect since last No Secondary Verification Process Yes visito Completed: Hospitalized since last visit: No Patient Has Alerts: Yes Implantable device outside of the clinic excluding No Patient Alerts: Right ABI: non cellular tissue based products placed in the center comp since last visit: Right TBI: 0.55 Has Dressing in Place as Prescribed: Yes Left ABI: 0.76 Has Compression in Place as Prescribed: Yes Left TBI: 0.52 Pain Present Now: No Electronic Signature(s) Signed: 01/24/2020 5:28:43 PM By: Levan Hurst RN, BSN Entered By: Levan Hurst on 01/24/2020 10:26:09 -------------------------------------------------------------------------------- Compression Therapy Details Patient Name: Date of Service: [name] 01/24/2020 9:45 AM Medical Record [id number] Patient Account Number: [id number] Date of Birth/Sex: Treating RN: 18-Jan-1946 (74 y.o. [name] Primary Care Mj Willis: Cathlean Cower Other Clinician: Referring Trevonn Hallum: Treating Makayleigh Poliquin/Extender:Robson, Arta Bruce, Casper Harrison in Treatment: 1 Compression Therapy Performed for Wound  Wound #1 Left,Medial Lower Leg Assessment: Performed By: Clinician Levan Hurst, RN Compression Type: Three Hydrologist) Signed: 01/24/2020 5:28:43 PM By: Levan Hurst RN, BSN Entered By: Levan Hurst on 01/24/2020 10:37:32 -------------------------------------------------------------------------------- Compression Therapy Details Patient Name: Date of Service: [name] 01/24/2020 9:45 AM Medical Record [id number] Patient Account Number: [id number] Date of Birth/Sex: Treating RN: 07-18-1946 (74 y.o. [name] Primary Care Selah Zelman: Cathlean Cower Other Clinician: Referring Kyion Gautier: Treating Jacorion Klem/Extender:Robson, Arta Bruce, Casper Harrison in Treatment: 1 Compression Therapy Performed for Wound Wound #2 Left,Anterior Lower Leg Assessment: Performed By: Clinician Levan Hurst, RN Compression Type: Three Hydrologist) Signed: 01/24/2020 5:28:43 PM By: Levan Hurst RN, BSN Entered By: Levan Hurst on 01/24/2020 10:37:32 -------------------------------------------------------------------------------- Encounter Discharge Information Details Patient Name: Date of Service: [name] 01/24/2020 9:45 AM Medical Record [id number] Patient Account Number: [id number] Date of Birth/Sex: Treating RN: 1946/08/16 (74 y.o. [name] Primary Care Clorinda Wyble: Cathlean Cower Other Clinician: Referring Caedyn Tassinari: Treating Jadah Bobak/Extender:Robson, Arta Bruce, Casper Harrison in Treatment: 1 Encounter Discharge Information Items Discharge Condition: Stable Ambulatory Status: Cane Discharge Destination: Home Transportation: Private Auto Accompanied By: alone Schedule Follow-up Appointment: Yes Clinical Summary of Care: Patient Declined Electronic Signature(s) Signed: 01/24/2020 5:28:43 PM By: Levan Hurst RN, BSN Entered By: Levan Hurst on 01/24/2020  10:38:18 -------------------------------------------------------------------------------- Wound Assessment Details Patient Name: Date of Service: [name] 01/24/2020 9:45 AM Medical Record [id number] Patient Account Number: [id number] Date of Birth/Sex: Treating RN: 1946/02/27 (74 y.o. [name] Primary Care Carter Kassel: Cathlean Cower Other Clinician: Referring Brylin Stopper: Treating Rosamund Nyland/Extender:Robson, Arta Bruce, Casper Harrison in Treatment: 1 Wound Status Wound Number: 1 Primary Venous Leg Ulcer Etiology: Wound Location: Left, Medial Lower Leg Wound Open Wounding Event: Blister Status: Date Acquired: 12/23/2019 Comorbid Cataracts, Glaucoma, Anemia, Coronary Weeks Of Treatment: 1 History: Artery Disease, Hypertension, Peripheral Clustered Wound: No Arterial Disease, Peripheral Venous Disease, Type II Diabetes Wound Measurements Length: (cm) 1.4 % Reduct Width: (cm) 0.7 % Reduct Depth: (cm)  0.1 Epitheli Area: (cm) 0.77 Tunneli Volume: (cm) 0.077 Undermi Wound Description Classification: Full Thickness Without Exposed Support Structures Wound Distinct, outline attached Margin: Exudate Small Amount: Exudate Serosanguineous Type: Exudate red, brown Color: Wound Bed Granulation Amount: Large (67-100%) Granulation Quality: Red, Pink Necrotic Amount: None Present (0%) Foul Odor After Cleansing: No Slough/Fibrino No Exposed Structure Fascia Exposed: No Fat Layer (Subcutaneous Tissue) Exposed: Yes Tendon Exposed: No Muscle Exposed: No Joint Exposed: No Bone Exposed: No ion in Area: 0% ion in Volume: 0% alization: None ng: No ning: No Treatment Notes Wound #1 (Left, Medial Lower Leg) 1. Cleanse With Soap and water 2. Periwound Care Moisturizing lotion TCA Cream 3. Primary Dressing Applied Calcium Alginate Ag 4. Secondary Dressing ABD Pad 6. Support Layer Applied 3 layer compression Water quality scientist) Signed: 01/24/2020  5:28:43 PM By: Levan Hurst RN, BSN Entered By: Levan Hurst on 01/24/2020 10:37:16 -------------------------------------------------------------------------------- Wound Assessment Details Patient Name: Date of Service: [name] 01/24/2020 9:45 AM Medical Record [id number] Patient Account Number: [id number] Date of Birth/Sex: Treating RN: August 29, 1946 (74 y.o. [name] Primary Care Michalle Rademaker: Cathlean Cower Other Clinician: Referring Marquelle Balow: Treating Audia Amick/Extender:Robson, Arta Bruce, Casper Harrison in Treatment: 1 Wound Status Wound Number: 2 Primary Venous Leg Ulcer Etiology: Wound Location: Left, Anterior Lower Leg Wound Open Wounding Event: Blister Status: Date Acquired: 12/23/2019 Comorbid Cataracts, Glaucoma, Anemia, Coronary Weeks Of Treatment: 1 History: Artery Disease, Hypertension, Peripheral Clustered Wound: No Arterial Disease, Peripheral Venous Disease, Type II Diabetes Wound Measurements Length: (cm) 0.6 % Reduct Width: (cm) 0.5 % Reduct Depth: (cm) 0.1 Epitheli Area: (cm) 0.236 Tunneli Volume: (cm) 0.024 Undermi Wound Description Classification: Full Thickness Without Exposed Support Foul Od Structures Slough/ Wound Distinct, outline attached Margin: Exudate Small Amount: Exudate Serosanguineous Type: Exudate red, brown Color: Wound Bed Granulation Amount: Large (67-100%) Granulation Quality: Red, Pink Fascia Expos Necrotic Amount: None Present (0%) Fat Layer (S Tendon Expos Muscle Expos Joint Expose Bone Exposed or After Cleansing: No Fibrino No Exposed Structure ed: No ubcutaneous Tissue) Exposed: Yes ed: No ed: No d: No : No ion in Area: 0% ion in Volume: 0% alization: None ng: No ning: No Treatment Notes Wound #2 (Left, Anterior Lower Leg) 1. Cleanse With Soap and water 2. Periwound Care Moisturizing lotion TCA Cream 3. Primary Dressing Applied Calcium Alginate Ag 4. Secondary Dressing ABD  Pad 6. Support Layer Applied 3 layer compression Water quality scientist) Signed: 01/24/2020 5:28:43 PM By: Levan Hurst RN, BSN Entered By: Levan Hurst on 01/24/2020 10:37:21 -------------------------------------------------------------------------------- Vitals Details Patient Name: Date of Service: [name] 01/24/2020 9:45 AM Medical Record [id number] Patient Account Number: [id number] Date of Birth/Sex: Treating RN: 1946-03-23 (74 y.o. [name] Primary Care Amaurie Schreckengost: Cathlean Cower Other Clinician: Referring Cicely Ortner: Treating Adon Gehlhausen/Extender:Robson, Arta Bruce, Casper Harrison in Treatment: 1 Vital Signs Time Taken: 10:26 Temperature (F): 97.6 Height (in): 61 Pulse (bpm): 75 Weight (lbs): 230 Respiratory Rate (breaths/min): 20 Body Mass Index (BMI): 43.5 Blood Pressure (mmHg): 179/71 Reference Range: 80 - 120 mg / dl Electronic Signature(s) Signed: 01/24/2020 5:28:43 PM By: Levan Hurst RN, BSN Entered By: Levan Hurst on 01/24/2020 10:36:37

## 2020-01-27 NOTE — Progress Notes (Signed)
LEN, KLUVER ([id number]) Visit Report for 01/24/2020 SuperBill Details Patient Name: Date of Service: [name], [name] 01/24/2020 Medical Record [id number] Patient Account Number: [id number] Date of Birth/Sex: Treating RN: [birth year] (74 y.o. 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

## 2020-01-30 ENCOUNTER — Other Ambulatory Visit: Payer: Self-pay

## 2020-01-30 ENCOUNTER — Encounter (HOSPITAL_BASED_OUTPATIENT_CLINIC_OR_DEPARTMENT_OTHER): Payer: Medicare Other | Admitting: Internal Medicine

## 2020-01-30 DIAGNOSIS — E11622 Type 2 diabetes mellitus with other skin ulcer: Secondary | ICD-10-CM | POA: Diagnosis not present

## 2020-01-30 DIAGNOSIS — I87322 Chronic venous hypertension (idiopathic) with inflammation of left lower extremity: Secondary | ICD-10-CM | POA: Diagnosis not present

## 2020-01-30 DIAGNOSIS — I890 Lymphedema, not elsewhere classified: Secondary | ICD-10-CM | POA: Diagnosis not present

## 2020-01-30 DIAGNOSIS — I10 Essential (primary) hypertension: Secondary | ICD-10-CM | POA: Diagnosis not present

## 2020-01-30 DIAGNOSIS — L97821 Non-pressure chronic ulcer of other part of left lower leg limited to breakdown of skin: Secondary | ICD-10-CM | POA: Diagnosis not present

## 2020-01-30 DIAGNOSIS — E1151 Type 2 diabetes mellitus with diabetic peripheral angiopathy without gangrene: Secondary | ICD-10-CM | POA: Diagnosis not present

## 2020-01-30 NOTE — Progress Notes (Signed)
DESIA, SABAN ([id number]) Visit Report for 01/30/2020 Arrival Information Details Patient Name: Date of Service: [name], [name] 01/30/2020 12:30 PM Medical Record [id number] Patient Account Number: [id number] Date of Birth/Sex: Treating RN: 1946/07/20 (74 y.o. Hollie Salk, Larene Beach Primary Care Crystalina Stodghill: Cathlean Cower Other Clinician: Referring Jolane Bankhead: Treating Yuya Vanwingerden/Extender:Robson, Arta Bruce, Casper Harrison in Treatment: 2 Visit Information History Since Last Visit Added or deleted any medications: No Patient Arrived: Kasandra Knudsen Any new allergies or adverse reactions: No Arrival Time: 12:37 Had a fall or experienced change in No Accompanied By: self activities of daily living that may affect Transfer Assistance: None risk of falls: Patient Identification Verified: Yes Signs or symptoms of abuse/neglect since last No Secondary Verification Process Yes visito Completed: Hospitalized since last visit: No Patient Has Alerts: Yes Implantable device outside of the clinic excluding No Patient Alerts: Right ABI: non cellular tissue based products placed in the center comp since last visit: Right TBI: 0.55 Has Dressing in Place as Prescribed: Yes Left ABI: 0.76 Has Compression in Place as Prescribed: Yes Left TBI: 0.52 Pain Present Now: No Electronic Signature(s) Signed: 01/30/2020 5:43:40 PM By: Kela Millin Entered By: Kela Millin on 01/30/2020 12:38:10 -------------------------------------------------------------------------------- Clinic Level of Care Assessment Details Patient Name: Date of Service: [name], [name] 01/30/2020 12:30 PM Medical Record [id number] Patient Account Number: [id number] Date of Birth/Sex: Treating RN: 1946-03-03 (74 y.o. Helene Shoe, Meta.Reding Primary Care Hue Steveson: Cathlean Cower Other Clinician: Referring Deundre Thong: Treating Linzi Ohlinger/Extender:Robson, Arta Bruce, Casper Harrison in Treatment: 2 Clinic Level of Care  Assessment Items TOOL 4 Quantity Score X - Use when only an EandM is performed on FOLLOW-UP visit 1 0 ASSESSMENTS - Nursing Assessment / Reassessment X - Reassessment of Co-morbidities (includes updates in patient status) 1 10 X - Reassessment of Adherence to Treatment Plan 1 5 ASSESSMENTS - Wound and Skin Assessment / Reassessment []  - Simple Wound Assessment / Reassessment - one wound 0 X - Complex Wound Assessment / Reassessment - multiple wounds 2 5 X - Dermatologic / Skin Assessment (not related to wound area) 1 10 ASSESSMENTS - Focused Assessment X - Circumferential Edema Measurements - multi extremities 1 5 X - Nutritional Assessment / Counseling / Intervention 1 10 []  - Lower Extremity Assessment (monofilament, tuning fork, pulses) 0 []  - Peripheral Arterial Disease Assessment (using hand held doppler) 0 ASSESSMENTS - Ostomy and/or Continence Assessment and Care []  - Incontinence Assessment and Management 0 []  - Ostomy Care Assessment and Management (repouching, etc.) 0 PROCESS - Coordination of Care []  - Simple Patient / Family Education for ongoing care 0 X - Complex (extensive) Patient / Family Education for ongoing care 1 20 X - Staff obtains Programmer, systems, Records, Test Results / Process Orders 1 10 []  - Staff telephones HHA, Nursing Homes / Clarify orders / etc 0 []  - Routine Transfer to another Facility (non-emergent condition) 0 []  - Routine Hospital Admission (non-emergent condition) 0 []  - New Admissions / Biomedical engineer / Ordering NPWT, Apligraf, etc. 0 []  - Emergency Hospital Admission (emergent condition) 0 []  - Simple Discharge Coordination 0 X - Complex (extensive) Discharge Coordination 1 15 PROCESS - Special Needs []  - Pediatric / Minor Patient Management 0 []  - Isolation Patient Management 0 []  - Hearing / Language / Visual special needs 0 []  - Assessment of Community assistance (transportation, D/C planning, etc.) 0 []  - Additional assistance /  Altered mentation 0 []  - Support Surface(s) Assessment (bed, cushion, seat, etc.) 0 INTERVENTIONS - Wound Cleansing / Measurement []  - Simple Wound Cleansing -  one wound 0 X - Complex Wound Cleansing - multiple wounds 2 5 X - Wound Imaging (photographs - any number of wounds) 1 5 []  - Wound Tracing (instead of photographs) 0 []  - Simple Wound Measurement - one wound 0 X - Complex Wound Measurement - multiple wounds 2 5 INTERVENTIONS - Wound Dressings []  - Small Wound Dressing one or multiple wounds 0 []  - Medium Wound Dressing one or multiple wounds 0 []  - Large Wound Dressing one or multiple wounds 0 []  - Application of Medications - topical 0 []  - Application of Medications - injection 0 INTERVENTIONS - Miscellaneous []  - External ear exam 0 []  - Specimen Collection (cultures, biopsies, blood, body fluids, etc.) 0 []  - Specimen(s) / Culture(s) sent or taken to Lab for analysis 0 []  - Patient Transfer (multiple staff / Civil Service fast streamer / Similar devices) 0 []  - Simple Staple / Suture removal (25 or less) 0 []  - Complex Staple / Suture removal (26 or more) 0 []  - Hypo / Hyperglycemic Management (close monitor of Blood Glucose) 0 []  - Ankle / Brachial Index (ABI) - do not check if billed separately 0 X - Vital Signs 1 5 Has the patient been seen at the hospital within the last three years: Yes Total Score: 125 Level Of Care: New/Established - Level 4 Electronic Signature(s) Signed: 01/30/2020 6:08:37 PM By: Deon Pilling Entered By: Deon Pilling on 01/30/2020 16:11:13 -------------------------------------------------------------------------------- Lower Extremity Assessment Details Patient Name: Date of Service: [name], [name] 01/30/2020 12:30 PM Medical Record [id number] Patient Account Number: [id number] Date of Birth/Sex: Treating RN: 11-10-1945 (74 y.o. Clearnce Sorrel Primary Care Olivette Beckmann: Cathlean Cower Other Clinician: Referring Macaela Presas: Treating  Harvin Konicek/Extender:Robson, Arta Bruce, Casper Harrison in Treatment: 2 Edema Assessment Assessed: [Left: No] [Right: No] Edema: [Left: N] [Right: o] Calf Left: Right: Point of Measurement: 34 cm From Medial Instep 41.5 cm 42.5 cm Ankle Left: Right: Point of Measurement: 15 cm From Medial Instep 20 cm 23 cm Vascular Assessment Pulses: Dorsalis Pedis Palpable: [Left:Yes] [Right:Yes] Electronic Signature(s) Signed: 01/30/2020 5:43:40 PM By: Kela Millin Entered By: Kela Millin on 01/30/2020 12:47:13 -------------------------------------------------------------------------------- Multi Wound Chart Details Patient Name: Date of Service: [name] 01/30/2020 12:30 PM Medical Record [id number] Patient Account Number: [id number] Date of Birth/Sex: Treating RN: 1946-07-18 (74 y.o. Helene Shoe, Meta.Reding Primary Care Domanique Huesman: Cathlean Cower Other Clinician: Referring Ike Maragh: Treating Tranika Scholler/Extender:Robson, Arta Bruce, Casper Harrison in Treatment: 2 Vital Signs Height(in): 61 Pulse(bpm): 11 Weight(lbs): 80 Blood Pressure(mmHg): 162/65 Body Mass Index(BMI): 43 Temperature(F): 98.1 Respiratory 19 Rate(breaths/min): Photos: [1:No Photos] [2:No Photos] [N/A:N/A] Wound Location: [1:Left, Medial Lower Leg] [2:Left, Anterior Lower Leg] [N/A:N/A] Wounding Event: [1:Blister] [2:Blister] [N/A:N/A] Primary Etiology: [1:Venous Leg Ulcer] [2:Venous Leg Ulcer] [N/A:N/A] Comorbid History: [1:Cataracts, Glaucoma, Anemia, Coronary Artery Anemia, Coronary Artery Disease, Hypertension, Peripheral Arterial Disease, Peripheral Arterial Disease, Peripheral Venous Disease, Peripheral Venous Disease, Type II Diabetes]  [2:Cataracts, Glaucoma, Disease, Hypertension, Type II Diabetes] [N/A:N/A] Date Acquired: [1:12/23/2019] [2:12/23/2019] [N/A:N/A] Weeks of Treatment: [1:2] [2:2] [N/A:N/A] Wound Status: [1:Healed - Epithelialized] [2:Healed - Epithelialized] [N/A:N/A] Measurements L x W x  D 0x0x0 [2:0x0x0] [N/A:N/A] (cm) Area (cm) : [1:0] [2:0] [N/A:N/A] Volume (cm) : [1:0] [2:0] [N/A:N/A] % Reduction in Area: [1:100.00%] [2:100.00%] [N/A:N/A] % Reduction in Volume: 100.00% [2:100.00%] [N/A:N/A] Classification: [1:Full Thickness Without Exposed Support Structures Exposed Support Structures] [2:Full Thickness Without] [N/A:N/A] Exudate Amount: [1:None Present] [2:None Present] [N/A:N/A] Wound Margin: [1:Distinct, outline attached Distinct, outline attached N/A] Granulation Amount: [1:None Present (0%)] [2:None Present (0%)] [N/A:N/A] Necrotic Amount: [1:None  Present (0%)] [2:None Present (0%)] [N/A:N/A] Exposed Structures: [1:Fascia: No Fat Layer (Subcutaneous Fat Layer (Subcutaneous Tissue) Exposed: No Tendon: No Muscle: No Joint: No Bone: No Large (67-100%)] [2:Fascia: No Tissue) Exposed: No Tendon: No Muscle: No Joint: No Bone: No Large (67-100%)] [N/A:N/A N/A] Treatment Notes Electronic Signature(s) Signed: 01/30/2020 5:58:56 PM By: Linton Ham MD Signed: 01/30/2020 6:08:37 PM By: Deon Pilling Entered By: Linton Ham on 01/30/2020 13:19:52 -------------------------------------------------------------------------------- Multi-Disciplinary Care Plan Details Patient Name: Date of Service: [name] 01/30/2020 12:30 PM Medical Record [id number] Patient Account Number: [id number] Date of Birth/Sex: Treating RN: [birth year] (74 y.o. Debby Bud Primary Care Obediah Welles: Cathlean Cower Other Clinician: Referring Jamari Moten: Treating Stephania Macfarlane/Extender:Robson, Arta Bruce, Casper Harrison in Treatment: 2 Active Inactive Electronic Signature(s) Signed: 01/30/2020 6:08:37 PM By: Deon Pilling Entered By: Deon Pilling on 01/30/2020 12:58:29 -------------------------------------------------------------------------------- Pain Assessment Details Patient Name: Date of Service: [name], [name] 01/30/2020 12:30 PM Medical Record [id number] Patient  Account Number: [id number] Date of Birth/Sex: Treating RN: Feb 29, 1946 (74 y.o. Clearnce Sorrel Primary Care Maja Mccaffery: Cathlean Cower Other Clinician: Referring Wolfe Camarena: Treating Jessilynn Taft/Extender:Robson, Arta Bruce, Casper Harrison in Treatment: 2 Active Problems Location of Pain Severity and Description of Pain Patient Has Paino No Site Locations Pain Management and Medication Current Pain Management: Electronic Signature(s) Signed: 01/30/2020 5:43:40 PM By: Kela Millin Entered By: Kela Millin on 01/30/2020 12:42:23 -------------------------------------------------------------------------------- Patient/Caregiver Education Details Patient Name: [name] L. Date of Service: 4/8/2021andnbsp12:30 PM Medical Record [id number] Patient Account Number: [id number] Date of Birth/Gender: Treating RN: [birth year] (74 y.o. Debby Bud Primary Care Physician: Cathlean Cower Other Clinician: Referring Physician: Treating Physician/Extender:Robson, Arta Bruce, Casper Harrison in Treatment: 2 Education Assessment Education Provided To: Patient Education Topics Provided Wound/Skin Impairment: Handouts: Caring for Your Ulcer Methods: Explain/Verbal Responses: Reinforcements needed Electronic Signature(s) Signed: 01/30/2020 6:08:37 PM By: Deon Pilling Entered By: Deon Pilling on 01/30/2020 13:01:47 -------------------------------------------------------------------------------- Wound Assessment Details Patient Name: Date of Service: [name], [name] 01/30/2020 12:30 PM Medical Record [id number] Patient Account Number: [id number] Date of Birth/Sex: Treating RN: 1946/05/31 (74 y.o. Clearnce Sorrel Primary Care Ivory Maduro: Cathlean Cower Other Clinician: Referring Chandlar Staebell: Treating Reality Dejonge/Extender:Robson, Arta Bruce, Casper Harrison in Treatment: 2 Wound Status Wound Number: 1 Primary Venous Leg Ulcer Etiology: Wound Location: Left, Medial Lower  Leg Wound Healed - Epithelialized Wounding Event: Blister Status: Date Acquired: 12/23/2019 Comorbid Cataracts, Glaucoma, Anemia, Coronary Weeks Of Treatment: 2 History: Artery Disease, Hypertension, Peripheral Clustered Wound: No Arterial Disease, Peripheral Venous Disease, Type II Diabetes Wound Measurements Length: (cm) 0 % Reduct Width: (cm) 0 % Reduct Depth: (cm) 0 Epitheli Area: (cm) 0 Tunneli Volume: (cm) 0 Undermi Wound Description Full Thickness Without Exposed Support Foul Odo Classification: Structures Slough/F Wound Distinct, outline attached Distinct, outline attached Margin: Exudate None Present Amount: Wound Bed Granulation Amount: None Present (0%) Necrotic Amount: None Present (0%) Fascia E Fat Laye Tendon E Muscle E Joint Ex Bone Exp r After Cleansing: No ibrino No Exposed Structure xposed: No r (Subcutaneous Tissue) Exposed: No xposed: No xposed: No posed: No osed: No ion in Area: 100% ion in Volume: 100% alization: Large (67-100%) ng: No ning: No Electronic Signature(s) Signed: 01/30/2020 5:43:40 PM By: Kela Millin Entered By: Kela Millin on 01/30/2020 12:51:17 -------------------------------------------------------------------------------- Wound Assessment Details Patient Name: Date of Service: [name], [name] 01/30/2020 12:30 PM Medical Record [id number] Patient Account Number: [id number] Date of Birth/Sex: Treating RN: 03/20/1946 (74 y.o. Clearnce Sorrel Primary Care Kaoru Benda: Cathlean Cower Other Clinician: Referring Lynden Carrithers: Treating Kaleya Douse/Extender:Robson, Arta Bruce, Casper Harrison in Treatment:  2 Wound Status Wound Number: 2 Primary Venous Leg Ulcer Etiology: Wound Location: Left, Anterior Lower Leg Wound Healed - Epithelialized Wounding Event: Blister Status: Date Acquired: 12/23/2019 Comorbid Cataracts, Glaucoma, Anemia, Coronary Weeks Of Treatment: 2 History: Artery Disease, Hypertension,  Peripheral Clustered Wound: No Arterial Disease, Peripheral Venous Disease, Type II Diabetes Wound Measurements Length: (cm) 0 % Reduct Width: (cm) 0 % Reduct Depth: (cm) 0 Epitheli Area: (cm) 0 Tunneli Volume: (cm) 0 Undermi Wound Description Full Thickness Without Exposed Support Foul Odo Classification: Structures Slough/F Wound Distinct, outline attached Margin: Exudate None Present Amount: Wound Bed Granulation Amount: None Present (0%) Necrotic Amount: None Present (0%) Fascia Expo Fat Layer ( Tendon Expo Muscle Expo Joint Expos Bone Expose r After Cleansing: No ibrino No Exposed Structure sed: No Subcutaneous Tissue) Exposed: No sed: No sed: No ed: No d: No ion in Area: 100% ion in Volume: 100% alization: Large (67-100%) ng: No ning: No Electronic Signature(s) Signed: 01/30/2020 5:43:40 PM By: Kela Millin Entered By: Kela Millin on 01/30/2020 12:51:27 -------------------------------------------------------------------------------- Vitals Details Patient Name: Date of Service: [name] 01/30/2020 12:30 PM Medical Record [id number] Patient Account Number: [id number] Date of Birth/Sex: Treating RN: [birth year] (74 y.o. Clearnce Sorrel Primary Care Aedyn Mckeon: Cathlean Cower Other Clinician: Referring Kimani Hovis: Treating Blair Mesina/Extender:Robson, Arta Bruce, Casper Harrison in Treatment: 2 Vital Signs Time Taken: 12:40 Temperature (F): 98.1 Height (in): 61 Pulse (bpm): 72 Weight (lbs): 230 Respiratory Rate (breaths/min): 19 Body Mass Index (BMI): 43.5 Blood Pressure (mmHg): 162/65 Reference Range: 80 - 120 mg / dl Electronic Signature(s) Signed: 01/30/2020 5:43:40 PM By: Kela Millin Entered By: Kela Millin on 01/30/2020 12:42:12

## 2020-01-30 NOTE — Progress Notes (Signed)
[name], [name] ([id number]) Visit Report for 01/30/2020 HPI Details Patient Name: Date of Service: [name], [name] 01/30/2020 12:30 PM Medical Record [id number] Patient Account Number: [id number] Date of Birth/Sex: Treating RN: 1946-01-23 (74 y.o. [name] Primary Care Provider: Cathlean Cower Other Clinician: Referring Provider: Treating Provider/Extender:Nalanie Winiecki, Arta Bruce, Casper Harrison in Treatment: 2 History of Present Illness HPI Description: ADMISSION 01/16/20 This is a 74 year old woman with diabetes. I am not exactly sure what type of diabetes this is that she is on insulin. A brief review of [place] link did not really solve this issue. In any case she has wounds on her left anterior lower leg that have been there about 3 weeks. She has been tight using topical antibiotics and her own stocking and she became worried because these would not heal. She also has arterial disease and has I think previous bilateral common iliac artery stents. She had recent arterial studies that showed an ABI on the right at noncompressible on the left at 0.77 TBI's of 0.55 on the right and 0.52 on the left. She is here for our review of this. Past medical history; I think the patient probably has chronic venous insufficiency and wears stockings for this religiously although she did not bring them in and I am not really sure how old they are either. As noted she has diabetic PAD, hyperlipidemia, coronary artery disease, renal artery stenosis. 4/8; this patient we admitted to the clinic 2 weeks ago. She has had recurrent wounds on her bilateral lower legs. I think she has lymphedema with inverted bottle sign she also has known PAD. The wounds have closed over these were on the left medial and left lateral Electronic Signature(s) Signed: 01/30/2020 5:58:56 PM By: Linton Ham MD Entered By: Linton Ham on 01/30/2020  13:20:46 -------------------------------------------------------------------------------- Physical Exam Details Patient Name: Date of Service: [name] 01/30/2020 12:30 PM Medical Record [id number] Patient Account Number: [id number] Date of Birth/Sex: Treating RN: July 21, 1946 (74 y.o. [name] Primary Care Provider: Cathlean Cower Other Clinician: Referring Provider: Treating Provider/Extender:Montell Leopard, Arta Bruce, Casper Harrison in Treatment: 2 Constitutional Patient is hypertensive.. Pulse regular and within target range for patient.Marland Kitchen Respirations regular, non-labored and within target range.. Temperature is normal and within the target range for the patient.Marland Kitchen Appears in no distress. Cardiovascular Her pedal pulses at the dorsalis pedis are palpable bilaterally. Notes Wound exam; the patient had 2 small superficial wounds just above the ankle. She has tightly fibrosed skin with edema more proximally [inverted bottle sign]. Electronic Signature(s) Signed: 01/30/2020 5:58:56 PM By: Linton Ham MD Entered By: Linton Ham on 01/30/2020 13:21:55 -------------------------------------------------------------------------------- Physician Orders Details Patient Name: Date of Service: [name] 01/30/2020 12:30 PM Medical Record [id number] Patient Account Number: [id number] Date of Birth/Sex: Treating RN: [birth year] (74 y.o. [name], [name] Primary Care Provider: Cathlean Cower Other Clinician: Referring Provider: Treating Provider/Extender:Crewe Heathman, Arta Bruce, Casper Harrison in Treatment: 2 Verbal / Phone Orders: No Diagnosis Coding ICD-10 Coding Code Description E11.622 Type 2 diabetes mellitus with other skin ulcer I87.322 Chronic venous hypertension (idiopathic) with inflammation of left lower extremity E11.51 Type 2 diabetes mellitus with diabetic peripheral angiopathy without gangrene L97.821 Non-pressure chronic ulcer of other part of left  lower leg limited to breakdown of skin Discharge From [facility] Discharge from [place] - Call if any future wound care needs. Skin Barriers/Peri-Wound Care Moisturizing lotion - lotion both legs nightly. Edema Control Avoid standing for long periods of time Elevate legs to the level of  the heart or above for 30 minutes daily and/or when sitting, a frequency of: - throughout the day. Exercise regularly Support Garment 20-30 mm/Hg pressure to: - patient to order compression stockings from [facility]. Apply in the morning and remove at night this will prevent swelling and potential wounds. Electronic Signature(s) Signed: 01/30/2020 5:58:56 PM By: Linton Ham MD Signed: 01/30/2020 6:08:37 PM By: Deon Pilling Entered By: Deon Pilling on 01/30/2020 13:16:25 -------------------------------------------------------------------------------- Problem List Details Patient Name: Date of Service: [name], [name] 01/30/2020 12:30 PM Medical Record [id number] Patient Account Number: [id number] Date of Birth/Sex: Treating RN: [birth year] (74 y.o. [name], [name] Primary Care Provider: Cathlean Cower Other Clinician: Referring Provider: Treating Provider/Extender:Kanika Bungert, Arta Bruce, Casper Harrison in Treatment: 2 Active Problems ICD-10 Evaluated Encounter Code Description Active Date Today Diagnosis E11.622 Type 2 diabetes mellitus with other skin ulcer 01/16/2020 No Yes I89.0 Lymphedema, not elsewhere classified 01/30/2020 No Yes I87.322 Chronic venous hypertension (idiopathic) with 01/16/2020 No Yes inflammation of left lower extremity E11.51 Type 2 diabetes mellitus with diabetic peripheral 01/16/2020 No Yes angiopathy without gangrene L97.821 Non-pressure chronic ulcer of other part of left lower 01/16/2020 No Yes leg limited to breakdown of skin Inactive Problems Resolved Problems Electronic Signature(s) Signed: 01/30/2020 5:58:56 PM By: Linton Ham MD Entered By:  Linton Ham on 01/30/2020 13:19:41 -------------------------------------------------------------------------------- Progress Note Details Patient Name: Date of Service: [name] 01/30/2020 12:30 PM Medical Record [id number] Patient Account Number: [id number] Date of Birth/Sex: Treating RN: 1946-07-31 (74 y.o. [name] Primary Care Provider: Cathlean Cower Other Clinician: Referring Provider: Treating Provider/Extender:Tycen Dockter, Arta Bruce, Casper Harrison in Treatment: 2 Subjective History of Present Illness (HPI) ADMISSION 01/16/20 This is a 74 year old woman with diabetes. I am not exactly sure what type of diabetes this is that she is on insulin. A brief review of [place] [facility] did not really solve this issue. In any case she has wounds on her left anterior lower leg that have been there about 3 weeks. She has been tight using topical antibiotics and her own stocking and she became worried because these would not heal. She also has arterial disease and has I think previous bilateral common iliac artery stents. She had recent arterial studies that showed an ABI on the right at noncompressible on the left at 0.77 TBI's of 0.55 on the right and 0.52 on the left. She is here for our review of this. Past medical history; I think the patient probably has chronic venous insufficiency and wears stockings for this religiously although she did not bring them in and I am not really sure how old they are either. As noted she has diabetic PAD, hyperlipidemia, coronary artery disease, renal artery stenosis. 4/8; this patient we admitted to the clinic 2 weeks ago. She has had recurrent wounds on her bilateral lower legs. I think she has lymphedema with inverted bottle sign she also has known PAD. The wounds have closed over these were on the left medial and left lateral Objective Constitutional Patient is hypertensive.. Pulse regular and within target range for patient.Marland Kitchen  Respirations regular, non-labored and within target range.. Temperature is normal and within the target range for the patient.Marland Kitchen Appears in no distress. Vitals Time Taken: 12:40 PM, Height: 61 in, Weight: 230 lbs, BMI: 43.5, Temperature: 98.1 F, Pulse: 72 bpm, Respiratory Rate: 19 breaths/min, Blood Pressure: 162/65 mmHg. Cardiovascular Her pedal pulses at the dorsalis pedis are palpable bilaterally. General Notes: Wound exam; the patient had 2 small superficial wounds just above the ankle. She  has tightly fibrosed skin with edema more proximally [inverted bottle sign]. Integumentary (Hair, Skin) Wound #1 status is Healed - Epithelialized. Original cause of wound was Blister. The wound is located on the Left,Medial Lower Leg. The wound measures 0cm length x 0cm width x 0cm depth; 0cm^2 area and 0cm^3 volume. There is no tunneling or undermining noted. There is a none present amount of drainage noted. The wound margin is distinct with the outline attached to the wound base. There is no granulation within the wound bed. There is no necrotic tissue within the wound bed. Wound #2 status is Healed - Epithelialized. Original cause of wound was Blister. The wound is located on the Left,Anterior Lower Leg. The wound measures 0cm length x 0cm width x 0cm depth; 0cm^2 area and 0cm^3 volume. There is no tunneling or undermining noted. There is a none present amount of drainage noted. The wound margin is distinct with the outline attached to the wound base. There is no granulation within the wound bed. There is no necrotic tissue within the wound bed. Assessment Active Problems ICD-10 Type 2 diabetes mellitus with other skin ulcer Lymphedema, not elsewhere classified Chronic venous hypertension (idiopathic) with inflammation of left lower extremity Type 2 diabetes mellitus with diabetic peripheral angiopathy without gangrene Non-pressure chronic ulcer of other part of left lower leg limited to  breakdown of skin Plan Discharge From [facility] Services: Discharge from [place] - Call if any future wound care needs. Skin Barriers/Peri-Wound Care: Moisturizing lotion - lotion both legs nightly. Edema Control: Avoid standing for long periods of time Elevate legs to the level of the heart or above for 30 minutes daily and/or when sitting, a frequency of: - throughout the day. Exercise regularly Support Garment 20-30 mm/Hg pressure to: - patient to order compression stockings from [facility]. Apply in the morning and remove at night this will prevent swelling and potential wounds. 1. Patient to be discharged. We gave her to her leg measurements and instructions to call [facility] in [place] for 20/30 below-knee stockings. 2. I am not completely certain that 54/62 stockings will be sufficient in this setting although I think it is a place to start. 3. Instructed to lubricate her skin liberally at least twice a day in the ankle area Electronic Signature(s) Signed: 01/30/2020 5:58:56 PM By: Linton Ham MD Entered By: Linton Ham on 01/30/2020 13:23:54 -------------------------------------------------------------------------------- SuperBill Details Patient Name: Date of Service: [name] 01/30/2020 Medical Record [id number] Patient Account Number: [id number] Date of Birth/Sex: Treating RN: [birth year] (74 y.o. [name], [name] Primary Care Provider: Cathlean Cower Other Clinician: Referring Provider: Treating Provider/Extender:Ariston Grandison, Arta Bruce, Casper Harrison in Treatment: 2 Diagnosis Coding ICD-10 Codes Code Description E11.622 Type 2 diabetes mellitus with other skin ulcer I89.0 Lymphedema, not elsewhere classified I87.322 Chronic venous hypertension (idiopathic) with inflammation of left lower extremity E11.51 Type 2 diabetes mellitus with diabetic peripheral angiopathy without gangrene L97.821 Non-pressure chronic ulcer of other part of left  lower leg limited to breakdown of skin Facility Procedures The patient participates with Medicare or their insurance follows the Medicare Facility Guidelines: CPT4 Code Description Modifier Quantity [id number] [facility] VISIT-LEV 4 EST PT 1 Physician Procedures CPT4 Code Description: [id number] - WC PHYS LEVEL 3 - EST PT ICD-10 Diagnosis Description E11.622 Type 2 diabetes mellitus with other skin ulcer I89.0 Lymphedema, not elsewhere classified I87.322 Chronic venous hypertension (idiopathic) with  inflammati extremity Modifier: on of left lower Quantity: 1 Electronic Signature(s) Signed: 01/30/2020 5:58:56 PM By:  Linton Ham MD Signed: 01/30/2020 6:08:37 PM By: Deon Pilling Entered By: Deon Pilling on 01/30/2020 16:11:31

## 2020-01-31 ENCOUNTER — Other Ambulatory Visit: Payer: Self-pay | Admitting: Internal Medicine

## 2020-02-03 ENCOUNTER — Other Ambulatory Visit: Payer: Self-pay | Admitting: Internal Medicine

## 2020-02-07 ENCOUNTER — Encounter (HOSPITAL_COMMUNITY)
Admission: RE | Admit: 2020-02-07 | Discharge: 2020-02-07 | Disposition: A | Discharge status: Home / Self Care | Payer: Medicare Other | Source: Ambulatory Visit | Attending: Nephrology | Admitting: Nephrology

## 2020-02-07 VITALS — BP 162/92 | HR 70 | Temp 97.3°F | Resp 20

## 2020-02-07 DIAGNOSIS — D539 Nutritional anemia, unspecified: Secondary | ICD-10-CM | POA: Insufficient documentation

## 2020-02-07 LAB — POCT HEMOGLOBIN-HEMACUE: Hemoglobin: 12.3 g/dL (ref 12.0–15.0)

## 2020-02-07 MED ORDER — EPOETIN ALFA 10000 UNIT/ML IJ SOLN: 10000.0000 [IU] | INTRAMUSCULAR | Status: DC

## 2020-02-07 MED ORDER — EPOETIN ALFA 10000 UNIT/ML IJ SOLN
INTRAMUSCULAR | Status: AC
  Filled 2020-02-07: qty 1

## 2020-02-12 ENCOUNTER — Other Ambulatory Visit: Payer: Self-pay | Admitting: Internal Medicine

## 2020-02-21 ENCOUNTER — Encounter (HOSPITAL_COMMUNITY)
Admission: RE | Admit: 2020-02-21 | Discharge: 2020-02-21 | Disposition: A | Discharge status: Home / Self Care | Payer: Medicare Other | Source: Ambulatory Visit | Attending: Nephrology | Admitting: Nephrology

## 2020-02-21 ENCOUNTER — Other Ambulatory Visit: Payer: Self-pay

## 2020-02-21 VITALS — BP 150/63 | HR 70 | Temp 96.3°F | Resp 20

## 2020-02-21 DIAGNOSIS — D539 Nutritional anemia, unspecified: Secondary | ICD-10-CM | POA: Diagnosis not present

## 2020-02-21 LAB — POCT HEMOGLOBIN-HEMACUE: Hemoglobin: 11.8 g/dL — ABNORMAL LOW (ref 12.0–15.0)

## 2020-02-21 MED ORDER — EPOETIN ALFA 10000 UNIT/ML IJ SOLN: 10000.0000 [IU] | INTRAMUSCULAR | Status: DC

## 2020-02-21 MED ORDER — EPOETIN ALFA 10000 UNIT/ML IJ SOLN
INTRAMUSCULAR | Status: AC
  Administered 2020-02-21: 10000 [IU] via SUBCUTANEOUS
  Filled 2020-02-21: qty 1

## 2020-02-26 ENCOUNTER — Other Ambulatory Visit: Payer: Self-pay | Admitting: *Deleted

## 2020-02-26 ENCOUNTER — Other Ambulatory Visit: Payer: Self-pay | Admitting: Internal Medicine

## 2020-02-28 ENCOUNTER — Other Ambulatory Visit: Payer: Self-pay

## 2020-02-28 ENCOUNTER — Encounter: Payer: Self-pay | Admitting: Podiatry

## 2020-02-28 ENCOUNTER — Ambulatory Visit (INDEPENDENT_AMBULATORY_CARE_PROVIDER_SITE_OTHER): Payer: Medicare Other | Admitting: Podiatry

## 2020-02-28 DIAGNOSIS — B351 Tinea unguium: Secondary | ICD-10-CM | POA: Diagnosis not present

## 2020-02-28 DIAGNOSIS — E1151 Type 2 diabetes mellitus with diabetic peripheral angiopathy without gangrene: Secondary | ICD-10-CM | POA: Diagnosis not present

## 2020-02-28 NOTE — Patient Instructions (Signed)
Diabetes Mellitus and Foot Care Foot care is an important part of your health, especially when you have diabetes. Diabetes may cause you to have problems because of poor blood flow (circulation) to your feet and legs, which can cause your skin to:  Become thinner and drier.  Break more easily.  Heal more slowly.  Peel and crack. You may also have nerve damage (neuropathy) in your legs and feet, causing decreased feeling in them. This means that you may not notice minor injuries to your feet that could lead to more serious problems. Noticing and addressing any potential problems early is the best way to prevent future foot problems. How to care for your feet Foot hygiene  Wash your feet daily with warm water and mild soap. Do not use hot water. Then, pat your feet and the areas between your toes until they are completely dry. Do not soak your feet as this can dry your skin.  Trim your toenails straight across. Do not dig under them or around the cuticle. File the edges of your nails with an emery board or nail file.  Apply a moisturizing lotion or petroleum jelly to the skin on your feet and to dry, brittle toenails. Use lotion that does not contain alcohol and is unscented. Do not apply lotion between your toes. Shoes and socks  Wear clean socks or stockings every day. Make sure they are not too tight. Do not wear knee-high stockings since they may decrease blood flow to your legs.  Wear shoes that fit properly and have enough cushioning. Always look in your shoes before you put them on to be sure there are no objects inside.  To break in new shoes, wear them for just a few hours a day. This prevents injuries on your feet. Wounds, scrapes, corns, and calluses  Check your feet daily for blisters, cuts, bruises, sores, and redness. If you cannot see the bottom of your feet, use a mirror or ask someone for help.  Do not cut corns or calluses or try to remove them with medicine.  If you  find a minor scrape, cut, or break in the skin on your feet, keep it and the skin around it clean and dry. You may clean these areas with mild soap and water. Do not clean the area with peroxide, alcohol, or iodine.  If you have a wound, scrape, corn, or callus on your foot, look at it several times a day to make sure it is healing and not infected. Check for: ? Redness, swelling, or pain. ? Fluid or blood. ? Warmth. ? Pus or a bad smell. General instructions  Do not cross your legs. This may decrease blood flow to your feet.  Do not use heating pads or hot water bottles on your feet. They may burn your skin. If you have lost feeling in your feet or legs, you may not know this is happening until it is too late.  Protect your feet from hot and cold by wearing shoes, such as at the beach or on hot pavement.  Schedule a complete foot exam at least once a year (annually) or more often if you have foot problems. If you have foot problems, report any cuts, sores, or bruises to your health care provider immediately. Contact a health care provider if:  You have a medical condition that increases your risk of infection and you have any cuts, sores, or bruises on your feet.  You have an injury that is not   healing.  You have redness on your legs or feet.  You feel burning or tingling in your legs or feet.  You have pain or cramps in your legs and feet.  Your legs or feet are numb.  Your feet always feel cold.  You have pain around a toenail. Get help right away if:  You have a wound, scrape, corn, or callus on your foot and: ? You have pain, swelling, or redness that gets worse. ? You have fluid or blood coming from the wound, scrape, corn, or callus. ? Your wound, scrape, corn, or callus feels warm to the touch. ? You have pus or a bad smell coming from the wound, scrape, corn, or callus. ? You have a fever. ? You have a red line going up your leg. Summary  Check your feet every day  for cuts, sores, red spots, swelling, and blisters.  Moisturize feet and legs daily.  Wear shoes that fit properly and have enough cushioning.  If you have foot problems, report any cuts, sores, or bruises to your health care provider immediately.  Schedule a complete foot exam at least once a year (annually) or more often if you have foot problems. This information is not intended to replace advice given to you by your health care provider. Make sure you discuss any questions you have with your health care provider. Document Revised: 07/03/2019 Document Reviewed: 11/11/2016 Elsevier Patient Education  2020 [employer]  Peripheral Neuropathy Peripheral neuropathy is a type of nerve damage. It affects nerves that carry signals between the spinal cord and the arms, legs, and the rest of the body (peripheral nerves). It does not affect nerves in the spinal cord or brain. In peripheral neuropathy, one nerve or a group of nerves may be damaged. Peripheral neuropathy is a broad category that includes many specific nerve disorders, like diabetic neuropathy, hereditary neuropathy, and carpal tunnel syndrome. What are the causes? This condition may be caused by:  Diabetes. This is the most common cause of peripheral neuropathy.  Nerve injury.  Pressure or stress on a nerve that lasts a long time.  Lack (deficiency) of B vitamins. This can result from alcoholism, poor diet, or a restricted diet.  Infections.  Autoimmune diseases, such as rheumatoid arthritis and systemic lupus erythematosus.  Nerve diseases that are passed from parent to child (inherited).  Some medicines, such as cancer medicines (chemotherapy).  Poisonous (toxic) substances, such as lead and mercury.  Too little blood flowing to the legs.  Kidney disease.  Thyroid disease. In some cases, the cause of this condition is not known. What are the signs or symptoms? Symptoms of this condition depend on which of your  nerves is damaged. Common symptoms include:  Loss of feeling (numbness) in the feet, hands, or both.  Tingling in the feet, hands, or both.  Burning pain.  Very sensitive skin.  Weakness.  Not being able to move a part of the body (paralysis).  Muscle twitching.  Clumsiness or poor coordination.  Loss of balance.  Not being able to control your bladder.  Feeling dizzy.  Sexual problems. How is this diagnosed? Diagnosing and finding the cause of peripheral neuropathy can be difficult. Your health care provider will take your medical history and do a physical exam. A neurological exam will also be done. This involves checking things that are affected by your brain, spinal cord, and nerves (nervous system). For example, your health care provider will check your reflexes, how you move, and   what you can feel. You may have other tests, such as:  Blood tests.  Electromyogram (EMG) and nerve conduction tests. These tests check nerve function and how well the nerves are controlling the muscles.  Imaging tests, such as CT scans or MRI to rule out other causes of your symptoms.  Removing a small piece of nerve to be examined in a lab (nerve biopsy). This is rare.  Removing and examining a small amount of the fluid that surrounds the brain and spinal cord (lumbar puncture). This is rare. How is this treated? Treatment for this condition may involve:  Treating the underlying cause of the neuropathy, such as diabetes, kidney disease, or vitamin deficiencies.  Stopping medicines that can cause neuropathy, such as chemotherapy.  Medicine to relieve pain. Medicines may include: ? Prescription or over-the-counter pain medicine. ? Antiseizure medicine. ? Antidepressants. ? Pain-relieving patches that are applied to painful areas of skin.  Surgery to relieve pressure on a nerve or to destroy a nerve that is causing pain.  Physical therapy to help improve movement and  balance.  Devices to help you move around (assistive devices). Follow these instructions at home: Medicines  Take over-the-counter and prescription medicines only as told by your health care provider. Do not take any other medicines without first asking your health care provider.  Do not drive or use heavy machinery while taking prescription pain medicine. Lifestyle   Do not use any products that contain nicotine or tobacco, such as cigarettes and e-cigarettes. Smoking keeps blood from reaching damaged nerves. If you need help quitting, ask your health care provider.  Avoid or limit alcohol. Too much alcohol can cause a vitamin B deficiency, and vitamin B is needed for healthy nerves.  Eat a healthy diet. This includes: ? Eating foods that are high in fiber, such as fresh fruits and vegetables, whole grains, and beans. ? Limiting foods that are high in fat and processed sugars, such as fried or sweet foods. General instructions   If you have diabetes, work closely with your health care provider to keep your blood sugar under control.  If you have numbness in your feet: ? Check every day for signs of injury or infection. Watch for redness, warmth, and swelling. ? Wear padded socks and comfortable shoes. These help protect your feet.  Develop a good support system. Living with peripheral neuropathy can be stressful. Consider talking with a mental health specialist or joining a support group.  Use assistive devices and attend physical therapy as told by your health care provider. This may include using a walker or a cane.  Keep all follow-up visits as told by your health care provider. This is important. Contact a health care provider if:  You have new signs or symptoms of peripheral neuropathy.  You are struggling emotionally from dealing with peripheral neuropathy.  Your pain is not well-controlled. Get help right away if:  You have an injury or infection that is not healing  normally.  You develop new weakness in an arm or leg.  You fall frequently. Summary  Peripheral neuropathy is when the nerves in the arms, or legs are damaged, resulting in numbness, weakness, or pain.  There are many causes of peripheral neuropathy, including diabetes, pinched nerves, vitamin deficiencies, autoimmune disease, and hereditary conditions.  Diagnosing and finding the cause of peripheral neuropathy can be difficult. Your health care provider will take your medical history, do a physical exam, and do tests, including blood tests and nerve function tests.    Treatment involves treating the underlying cause of the neuropathy and taking medicines to help control pain. Physical therapy and assistive devices may also help. This information is not intended to replace advice given to you by your health care provider. Make sure you discuss any questions you have with your health care provider. Document Revised: 09/22/2017 Document Reviewed: 12/19/2016 Elsevier Patient Education  2020 [employer]  

## 2020-03-05 ENCOUNTER — Other Ambulatory Visit: Payer: Self-pay | Admitting: Internal Medicine

## 2020-03-05 NOTE — Progress Notes (Signed)
Subjective: [name] is a 74 y.o. female patient seen today at risk foot care. Pt has h/o NIDDM with chronic kidney disease and painful mycotic nails b/l that are difficult to trim. Pain interferes with ambulation. Aggravating factors include wearing enclosed shoe gear. Pain is relieved with periodic professional debridement. She is followed closely by Vascular for PAD as well. She voices no new pedal concerns on today's visit.  Patient Active Problem List   Diagnosis Date Noted  . Diabetic leg ulcer ([place]) 12/23/2019  . Right shoulder pain 07/08/2019  . Urinary incontinence 07/08/2019  . Gait disorder 08/04/2018  . Cellulitis of both lower extremities 07/26/2018  . Cellulitis 07/26/2018  . Cellulitis of right leg 06/14/2018  . Left wrist pain 06/14/2018  . Constipation 12/13/2017  . Hyperlipidemia LDL goal <130 11/23/2017  . Right upper quadrant abdominal tenderness without rebound tenderness 11/23/2017  . Cholelithiasis 11/17/2017  . Chronic kidney disease (CKD), stage IV (severe) ([place]) 11/17/2017  . Aortic atherosclerosis ([place]) 11/17/2017  . Diabetes mellitus with complication ([place]) 49/67/5916  . 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 Somnolence 06/27/2017  . Chronic pain 06/27/2017  . Bilateral hand numbness 06/08/2017  . Low back pain 03/09/2017  . Insomnia 12/27/2016  . Deficiency anemia 05/19/2016  . End stage renal disease ([place]) 01/15/2013  . Bilateral foot pain 09/07/2012  . Sciatica of left side 03/10/2012  . 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 Renal artery stenosis ([place]) 04/25/2011  . Preventative health care 04/12/2011  . PERIPHERAL EDEMA 04/01/2010  . B12 deficiency anemia 10/01/2009  . Right cervical radiculopathy 06/17/2008  . ANEMIA-IRON DEFICIENCY 12/13/2007  . PERIPHERAL VASCULAR DISEASE 12/13/2007  . Pure hyperglyceridemia 06/11/2007  . GLAUCOMA NOS 06/11/2007  . MITRAL REGURGITATION, 2 PLUS 06/11/2007  .  Essential hypertension 06/11/2007  . CAD in native artery 06/11/2007  . CAROTID ARTERY STENOSIS 06/11/2007  . GERD 06/11/2007  . Peptic Ulcer, Site NOS 06/11/2007  . OSTEOPOROSIS 06/11/2007    Current Outpatient Medications on File Prior to Visit  Medication Sig Dispense Refill  . ACCU-CHEK AVIVA PLUS test strip USE AS INSTRUCTED THREE TIMES DAILY. DX E11.9 100 strip 5  . acetaminophen (TYLENOL) 500 MG tablet Take 1-2 tablets (500-1,000 mg total) by mouth every 8 (eight) hours as needed for mild pain.    Marland Kitchen allopurinol (ZYLOPRIM) 100 MG tablet Take 100 mg by mouth daily.    Marland Kitchen amLODipine (NORVASC) 10 MG tablet Take 1 tablet (10 mg total) by mouth daily. 90 tablet 1  . aspirin 81 MG tablet Take 81 mg by mouth daily.    Marland Kitchen atorvastatin (LIPITOR) 80 MG tablet TAKE 1 TABLET BY MOUTH DAILY AT 6PM 90 tablet 3  . BD PEN NEEDLE NANO U/F 32G X 4 MM MISC USE DAILY WITH LANTUS SOLASTAR 100 each 11  . blood glucose meter kit and supplies KIT Dispense based on patient and insurance preference. Use up to three times daily as directed. (FOR ICD-9 250.00, 250.01). 1 each 0  . calcitRIOL (ROCALTROL) 0.5 MCG capsule Take by mouth daily.    . clopidogrel (PLAVIX) 75 MG tablet TAKE 1 TABLET BY MOUTH EVERY DAY 90 tablet 1  . Cyanocobalamin (B-12 PO) Take 1 tablet by mouth daily.    . diclofenac sodium (VOLTAREN) 1 % GEL Apply 4 g topically 4 (four) times daily as needed. 400 g 11  . doxycycline (VIBRA-TABS) 100 MG tablet Take 1 tablet (100 mg total) by  mouth 2 (two) times daily. 20 tablet 0  . ferrous sulfate 325 (65 FE) MG tablet TAKE 1 TABLET BY MOUTH TWICE A DAY 180 tablet 1  . furosemide (LASIX) 80 MG tablet Take 0.5 tablets (40 mg total) by mouth 2 (two) times daily.    Marland Kitchen gabapentin (NEURONTIN) 100 MG capsule TAKE 1 CAPSULE BY MOUTH THREE TIMES A DAY 90 capsule 5  . hydrOXYzine (ATARAX/VISTARIL) 10 MG tablet Take 1 tablet (10 mg total) by mouth 3 (three) times daily as needed for itching. 30 tablet 0  .  Insulin Glargine (LANTUS SOLOSTAR) 100 UNIT/ML Solostar Pen Inject 18 Units into the skin daily. 5 pen 11  . insulin lispro (HUMALOG KWIKPEN) 100 UNIT/ML KwikPen INEJCT 0-9 UNITS AS DIRECTED INTO SKIN 3 TIMES A DAY 15 mL 3  . Lancets MISC Use as directed three times daily with meals 100 each 3  . metoprolol tartrate (LOPRESSOR) 100 MG tablet TAKE 1 TABLET BY MOUTH TWICE A DAY 180 tablet 2  . MYRBETRIQ 25 MG TB24 tablet Take 25 mg by mouth daily.    . nitroGLYCERIN (NITROSTAT) 0.4 MG SL tablet Place 1 tablet (0.4 mg total) under the tongue every 5 (five) minutes as needed for chest pain. Reported on 04/06/2016 25 tablet 5  . omeprazole (PRILOSEC) 20 MG capsule TAKE 1 CAPSULE BY MOUTH EVERY DAY *APPT DUE IN FEB FOR REFILLS* 90 capsule 0  . polyethylene glycol powder (GLYCOLAX/MIRALAX) powder Take 17 gm by mouth daily 3350 g 11  . Potassium Chloride ER 20 MEQ TBCR Take 20 mEq by mouth daily.   6  . risedronate (ACTONEL) 150 MG tablet TAKE 1 TABLET (150 MG TOTAL) BY MOUTH EVERY 30 (THIRTY) DAYS. FIRST TUESDAY 3 tablet 3  . sodium bicarbonate 650 MG tablet Take 650 mg by mouth 2 (two) times daily.    . Tetrahydrozoline HCl (VISINE OP) Place 2 drops into both eyes daily as needed (DRY EYE).    Marland Kitchen thiamine (VITAMIN B-1) 50 MG tablet Take 1 tablet (50 mg total) by mouth daily. 90 tablet 1  . timolol (TIMOPTIC) 0.5 % ophthalmic solution PLACE 1 DROP IN BOTH EYES DAILY    . tiZANidine (ZANAFLEX) 4 MG tablet TAKE 1 TABLET BY MOUTH EVERY 6 HOURS AS NEEDED FOR MUSCLE SPASMS 360 tablet 1  . traMADol (ULTRAM) 50 MG tablet TAKE 1 TABLET BY MOUTH EVERY 6 HOURS AS NEEDED 120 tablet 2  . Vitamin D, Ergocalciferol, (DRISDOL) 1.25 MG (50000 UT) CAPS capsule Take 1 capsule (50,000 Units total) by mouth every 7 (seven) days. 12 capsule 0  . zolpidem (AMBIEN) 5 MG tablet TAKE 1 TABLET (5 MG TOTAL) BY MOUTH AT BEDTIME AS NEEDED FOR SLEEP. 30 tablet 5   No current facility-administered medications on file prior to visit.     No Known Allergies  Objective: Physical Exam  General: MATIE DIMAANO is a pleasant 74 y.o. y.o. AA female, morbidly obese, in NAD. AAO x 3.   Vascular:  Capillary refill time to digits immediate b/l. Faintly palpable pedal pulses b/l. Pedal hair absent b/l Skin temperature gradient within normal limits b/l.  Dermatological:  Pedal skin is thin shiny, atrophic bilaterally. No open wounds bilaterally. No interdigital macerations bilaterally. Toenails 1-5 b/l elongated, dystrophic, thickened, crumbly with subungual debris and tenderness to dorsal palpation.  Musculoskeletal:  Normal muscle strength 5/5 to all lower extremity muscle groups bilaterally. No pain crepitus or joint limitation noted with ROM b/l. Hammertoes noted to the 2-5 bilaterally.  Neurological:  Protective sensation intact 5/5 intact bilaterally with 10g monofilament b/l. Vibratory sensation intact b/l. Proprioception intact bilaterally.  Assessment and Plan:  1. Onychomycosis   2. Type II diabetes mellitus with peripheral circulatory disorder (HCC)    -Examined patient. -No new findings. No new orders. -Continue diabetic foot care principles. Literature dispensed on today.  -Toenails 1-5 b/l were debrided in length and girth with sterile nail nippers and dremel without iatrogenic bleeding.  -Patient to continue soft, supportive shoe gear daily. -Patient to report any pedal injuries to medical professional immediately. -Patient/POA to call should there be question/concern in the interim.  Return in about 9 weeks (around 05/01/2020) for diabetic nail trim.  Marzetta Board, DPM

## 2020-03-13 ENCOUNTER — Encounter (HOSPITAL_COMMUNITY): Payer: Medicare Other

## 2020-03-13 DIAGNOSIS — H401134 Primary open-angle glaucoma, bilateral, indeterminate stage: Secondary | ICD-10-CM | POA: Diagnosis not present

## 2020-03-13 DIAGNOSIS — H04123 Dry eye syndrome of bilateral lacrimal glands: Secondary | ICD-10-CM | POA: Diagnosis not present

## 2020-03-20 ENCOUNTER — Other Ambulatory Visit: Payer: Self-pay | Admitting: Internal Medicine

## 2020-03-20 NOTE — Telephone Encounter (Signed)
Please refill as per office routine med refill policy (all routine meds refilled for 3 mo or monthly per pt preference up to one year from last visit, then month to month grace period for 3 mo, then further med refills will have to be denied)  

## 2020-03-26 DIAGNOSIS — M1712 Unilateral primary osteoarthritis, left knee: Secondary | ICD-10-CM | POA: Diagnosis not present

## 2020-03-26 DIAGNOSIS — M1711 Unilateral primary osteoarthritis, right knee: Secondary | ICD-10-CM | POA: Diagnosis not present

## 2020-03-27 ENCOUNTER — Encounter (HOSPITAL_COMMUNITY): Payer: Medicare Other

## 2020-03-31 ENCOUNTER — Other Ambulatory Visit: Payer: Self-pay | Admitting: Internal Medicine

## 2020-03-31 NOTE — Telephone Encounter (Signed)
Please refill as per office routine med refill policy (all routine meds refilled for 3 mo or monthly per pt preference up to one year from last visit, then month to month grace period for 3 mo, then further med refills will have to be denied)  

## 2020-04-03 ENCOUNTER — Other Ambulatory Visit: Payer: Self-pay | Admitting: *Deleted

## 2020-04-03 NOTE — Patient Outreach (Addendum)
[facility]) Care Management  04/03/2020  [name] 01/19/1946 [id number]   Telephone Assessment-Successful  RN spoke with pt today and received an update. Pt continue to do well with no acute issues. Pt continues to report good blood sugar readings with her last A1c on 12/23/2019 at 7.5. Verified pt has a pending appointment in Sept for possible update on her A1c with Dr. [name]. Plan of care updated accordingly with intervention and goal was discussed. Based upon pt's adherence in managing her care will transition over the a [facility] health coach. Pt receptive and very appreciative for the management of care over the last several months.   Plan: Will pt's primary provider and refer to a Health Coach for ongoing diease management services. No request or inquires at this time.  THN CM Care Plan Problem One     Most Recent Value  Care Plan Problem One Knowledge Deficit of diabetes control related to A1c-8.1%  Role Documenting the Problem One Care Management [place] for Problem One Active  THN Long Term Goal  Pt will report a reduction in A1c 1-2 points in the next 90 days.  THN Long Term Goal Start Date 07/11/19  Interventions for Problem One Long Term Goal WIll continue to discuss ongoing diabetes management and verified next A1c with provider in Sept. Will continue to stress adherence with her ongong dietary habits and daily monitoring.  Will verify no additional needs at this time,      Ashley Mina, RN Care Management Coordinator [place] Office [phone]

## 2020-04-06 ENCOUNTER — Other Ambulatory Visit: Payer: Self-pay | Admitting: *Deleted

## 2020-04-09 ENCOUNTER — Ambulatory Visit: Payer: Self-pay | Admitting: *Deleted

## 2020-04-10 ENCOUNTER — Encounter (HOSPITAL_COMMUNITY)
Admission: RE | Admit: 2020-04-10 | Discharge: 2020-04-10 | Disposition: A | Discharge status: Home / Self Care | Payer: Medicare Other | Source: Ambulatory Visit | Attending: Nephrology | Admitting: Nephrology

## 2020-04-10 VITALS — BP 142/83 | HR 72 | Resp 20

## 2020-04-10 DIAGNOSIS — D539 Nutritional anemia, unspecified: Secondary | ICD-10-CM | POA: Diagnosis not present

## 2020-04-10 LAB — IRON AND TIBC
Iron: 48 ug/dL (ref 28–170)
Saturation Ratios: 19 % (ref 10.4–31.8)
TIBC: 252 ug/dL (ref 250–450)
UIBC: 204 ug/dL

## 2020-04-10 LAB — POCT HEMOGLOBIN-HEMACUE: Hemoglobin: 12.1 g/dL (ref 12.0–15.0)

## 2020-04-10 LAB — FERRITIN: Ferritin: 908 ng/mL — ABNORMAL HIGH (ref 11–307)

## 2020-04-10 MED ORDER — EPOETIN ALFA 10000 UNIT/ML IJ SOLN
INTRAMUSCULAR | Status: AC
  Filled 2020-04-10: qty 1

## 2020-04-10 MED ORDER — EPOETIN ALFA 10000 UNIT/ML IJ SOLN: 10000.0000 [IU] | INTRAMUSCULAR | Status: DC

## 2020-04-24 ENCOUNTER — Other Ambulatory Visit: Payer: Self-pay | Admitting: Internal Medicine

## 2020-04-25 NOTE — Telephone Encounter (Signed)
Done erx 

## 2020-04-28 ENCOUNTER — Other Ambulatory Visit: Payer: Self-pay | Admitting: *Deleted

## 2020-04-28 ENCOUNTER — Encounter: Payer: Self-pay | Admitting: *Deleted

## 2020-04-28 NOTE — Patient Outreach (Signed)
[facility]) Care Management  [place]  04/28/2020   [name] 1946-10-03 [id number]  Subjective: Successful telephone outreach call to patient. HIPAA identifiers obtained. Patient reports feeling good. She states that her diabetes is controlled pretty well at this time. She is committed to continue to improve upon eating a healthy, diabetic diet and increasing the amount of physical activity she does by walking more frequently along with doing her chair exercises daily. She is also motivated to lose weight to improve her overall health. Per Patient, emotionally she is in a good place, she denies having any recent falls, and she has good support from her family.   Encounter Medications:  Outpatient Encounter Medications as of 04/28/2020  Medication Sig Note  . ACCU-CHEK AVIVA PLUS test strip USE AS INSTRUCTED THREE TIMES DAILY. DX E11.9   . acetaminophen (TYLENOL) 500 MG tablet Take 1-2 tablets (500-1,000 mg total) by mouth every 8 (eight) hours as needed for mild pain.   Marland Kitchen allopurinol (ZYLOPRIM) 100 MG tablet Take 100 mg by mouth daily.   Marland Kitchen amLODipine (NORVASC) 10 MG tablet Take 1 tablet (10 mg total) by mouth daily.   Marland Kitchen aspirin 81 MG tablet Take 81 mg by mouth daily.   Marland Kitchen atorvastatin (LIPITOR) 80 MG tablet TAKE 1 TABLET BY MOUTH DAILY AT 6PM   . BD PEN NEEDLE NANO U/F 32G X 4 MM MISC USE DAILY WITH LANTUS SOLASTAR   . blood glucose meter kit and supplies KIT Dispense based on patient and insurance preference. Use up to three times daily as directed. (FOR ICD-9 250.00, 250.01).   . calcitRIOL (ROCALTROL) 0.5 MCG capsule Take by mouth daily.   . clopidogrel (PLAVIX) 75 MG tablet TAKE 1 TABLET BY MOUTH EVERY DAY   . Cyanocobalamin (B-12 PO) Take 1 tablet by mouth daily.   . diclofenac sodium (VOLTAREN) 1 % GEL Apply 4 g topically 4 (four) times daily as needed.   . ferrous sulfate 325 (65 FE) MG tablet TAKE 1 TABLET BY MOUTH TWICE A DAY   . furosemide (LASIX)  80 MG tablet Take 0.5 tablets (40 mg total) by mouth 2 (two) times daily.   Marland Kitchen gabapentin (NEURONTIN) 100 MG capsule TAKE 1 CAPSULE BY MOUTH THREE TIMES A DAY   . hydrOXYzine (ATARAX/VISTARIL) 10 MG tablet Take 1 tablet (10 mg total) by mouth 3 (three) times daily as needed for itching.   . Insulin Glargine (LANTUS SOLOSTAR) 100 UNIT/ML Solostar Pen Inject 18 Units into the skin daily.   . insulin lispro (HUMALOG KWIKPEN) 100 UNIT/ML KwikPen INEJCT 0-9 UNITS AS DIRECTED INTO SKIN 3 TIMES A DAY   . Lancets MISC Use as directed three times daily with meals   . metoprolol tartrate (LOPRESSOR) 100 MG tablet TAKE 1 TABLET BY MOUTH TWICE A DAY   . MYRBETRIQ 25 MG TB24 tablet Take 25 mg by mouth daily.   . nitroGLYCERIN (NITROSTAT) 0.4 MG SL tablet Place 1 tablet (0.4 mg total) under the tongue every 5 (five) minutes as needed for chest pain. Reported on 04/06/2016   . omeprazole (PRILOSEC) 20 MG capsule TAKE 1 CAPSULE BY MOUTH EVERY DAY *APPT DUE IN FEB FOR REFILLS*   . polyethylene glycol powder (GLYCOLAX/MIRALAX) powder Take 17 gm by mouth daily   . Potassium Chloride ER 20 MEQ TBCR Take 20 mEq by mouth daily.    . risedronate (ACTONEL) 150 MG tablet TAKE 1 TABLET (150 MG TOTAL) BY MOUTH EVERY 30 (THIRTY) DAYS. FIRST TUESDAY   .  sodium bicarbonate 650 MG tablet Take 650 mg by mouth 2 (two) times daily.   . Tetrahydrozoline HCl (VISINE OP) Place 2 drops into both eyes daily as needed (DRY EYE).   Marland Kitchen thiamine (VITAMIN B-1) 50 MG tablet Take 1 tablet (50 mg total) by mouth daily.   . timolol (TIMOPTIC) 0.5 % ophthalmic solution PLACE 1 DROP IN BOTH EYES DAILY   . tiZANidine (ZANAFLEX) 4 MG tablet TAKE 1 TABLET BY MOUTH EVERY 6 HOURS AS NEEDED FOR MUSCLE SPASMS   . traMADol (ULTRAM) 50 MG tablet TAKE 1 TABLET BY MOUTH EVERY 6 HOURS AS NEEDED   . Vitamin D, Ergocalciferol, (DRISDOL) 1.25 MG (50000 UT) CAPS capsule Take 1 capsule (50,000 Units total) by mouth every 7 (seven) days.   Marland Kitchen zolpidem (AMBIEN) 5 MG  tablet TAKE 1 TABLET BY MOUTH EVERY DAY AT BEDTIME AS NEEDED FOR SLEEP   . doxycycline (VIBRA-TABS) 100 MG tablet Take 1 tablet (100 mg total) by mouth 2 (two) times daily. (Patient not taking: Reported on 04/28/2020) 04/28/2020: Completed   No facility-administered encounter medications on file as of 04/28/2020.    Functional Status:  In your present state of health, do you have any difficulty performing the following activities: 04/28/2020 08/06/2019  Hearing? N N  Vision? N N  Difficulty concentrating or making decisions? N N  Walking or climbing stairs? N N  Dressing or bathing? N N  Doing errands, shopping? Y Y  Comment does not drive and daughter assist Assistance from her daughter  Conservation officer, nature and eating ? N N  Using the Toilet? N N  In the past six months, have you accidently leaked urine? N N  Do you have problems with loss of bowel control? N N  Managing your Medications? N N  Managing your Finances? N N  Housekeeping or managing your Housekeeping? N N  Some recent data might be hidden    Fall/Depression Screening: Fall Risk  04/28/2020 08/06/2019 06/03/2019  Falls in the past year? 0 0 0  Number falls in past yr: 0 - 0  Injury with Fall? 0 - 0  Risk for fall due to : - - Impaired balance/gait;Impaired mobility  Follow up Falls prevention discussed;Education provided;Falls evaluation completed - -   PHQ 2/9 Scores 04/28/2020 08/06/2019 06/03/2019 12/20/2018 05/29/2018 12/13/2017 12/20/2016  PHQ - 2 Score 0 0 0 0 0 0 1  PHQ- 9 Score - - - - 1 - -   Goals Addressed            This Visit's Progress   . Patient will report reduction in A1c by .5-1 points within the next 90 days       [place] (see longtitudinal plan of care for additional care plan information)  Objective:  Lab Results  Component Value Date   HGBA1C 7.5 (H) 12/23/2019 .   Lab Results  Component Value Date   CREATININE 2.83 (H) 12/23/2019   CREATININE 2.33 (H) 06/20/2019   CREATININE 2.84 (H)  01/25/2019 .   Marland Kitchen No results found for: EGFR  Current Barriers:  Marland Kitchen Knowledge Deficits related to basic Diabetes pathophysiology and self care/management  Case Manager Clinical Goal(s):  Over the next 30 days, patient will demonstrate improved adherence to prescribed treatment plan for diabetes self care/management as evidenced by:  Marland Kitchen Verbalize daily monitoring and recording of CBG within 30 days . Verbalize adherence to ADA/ carb modified diet within the next 30 days . Verbalize exercise 30 days/week .  Verbalize adherence to prescribed medication regimen within the next 30 days   Interventions:  . Reviewed medications with patient and discussed importance of medication adherence . Discussed plans with patient for ongoing care management follow up and provided patient with direct contact information for care management team . Encouraged patient to continue to monitor blood sugar, carbohydrates, sugar, and to exercise .  Patient Self Care Activities:  . Self administers oral medications as prescribed . Self administers insulin as prescribed . Attends all scheduled provider appointments . Checks blood sugars as prescribed and utilize hyper and hypoglycemia protocol as needed . Adheres to prescribed ADA/carb modified  Initial goal documentation        Plan: [place] will send PCP a Barrier Letter and today's assessment note, will send patient a planning healthy meals booklet, Glucerna coupons, and education regarding weight loss, nurse will call patient within the month of August and patient agrees to future outreach calls.   Emelia Loron RN, BSN [place] [phone] [name]

## 2020-05-08 ENCOUNTER — Ambulatory Visit: Payer: Medicare Other | Admitting: Podiatry

## 2020-05-15 ENCOUNTER — Other Ambulatory Visit: Payer: Self-pay

## 2020-05-15 ENCOUNTER — Encounter (HOSPITAL_COMMUNITY)
Admission: RE | Admit: 2020-05-15 | Discharge: 2020-05-15 | Disposition: A | Discharge status: Home / Self Care | Payer: Medicare Other | Source: Ambulatory Visit | Attending: Nephrology | Admitting: Nephrology

## 2020-05-15 VITALS — BP 107/77 | HR 71 | Resp 16

## 2020-05-15 DIAGNOSIS — D539 Nutritional anemia, unspecified: Secondary | ICD-10-CM | POA: Diagnosis not present

## 2020-05-15 LAB — POCT HEMOGLOBIN-HEMACUE: Hemoglobin: 12.1 g/dL (ref 12.0–15.0)

## 2020-05-15 MED ORDER — EPOETIN ALFA 10000 UNIT/ML IJ SOLN: 10000.0000 [IU] | INTRAMUSCULAR | Status: DC

## 2020-05-27 ENCOUNTER — Other Ambulatory Visit: Payer: Self-pay | Admitting: *Deleted

## 2020-05-27 NOTE — Patient Outreach (Signed)
[facility]) Care Management  [facility] Care Manager  05/27/2020   [name] 1946/06/19 [id number]  Subjective: Successful telephone outreach call to patient. HIPAA identifiers obtained. Patient states she is doing well. Patient reports that she and her family moved last week and she feels good about the new location. Patient explains that she does need to climb stairs to get to her bedroom but she currently feels safe doing this and thinks this will give her the opportunity to strengthen her legs. She does not climb the stairs unless someone is there to spot her especially going up the stairs. Patient states that she fell last night when she turned around and her foot stuck to the floor. She explains that she did not hurt herself because her bed broke her fall and added that she called her daughter to assist her with getting up off of the floor. Nurse provided fall education, will send patient [employer] information regarding a life alert system which may be covered by her insurance, and a fall prevention checklist.  Patient states her diabetes is well controlled at this time with her FBS ranging 79-140 and one time at 160. She continues to follow an ADA and low sodium diet and states she is reading food labels. Patient does chair exercises daily and is working towards increasing her physical activity by walking more routinely. Patient states she has excellent support from her family and is doing well emotionally.   Encounter Medications:  Outpatient Encounter Medications as of 05/27/2020  Medication Sig Note  . ACCU-CHEK AVIVA PLUS test strip USE AS INSTRUCTED THREE TIMES DAILY. DX E11.9   . acetaminophen (TYLENOL) 500 MG tablet Take 1-2 tablets (500-1,000 mg total) by mouth every 8 (eight) hours as needed for mild pain.   Marland Kitchen allopurinol (ZYLOPRIM) 100 MG tablet Take 100 mg by mouth daily.   Marland Kitchen amLODipine (NORVASC) 10 MG tablet Take 1 tablet (10 mg total) by mouth daily.   Marland Kitchen aspirin 81 MG  tablet Take 81 mg by mouth daily.   Marland Kitchen atorvastatin (LIPITOR) 80 MG tablet TAKE 1 TABLET BY MOUTH DAILY AT 6PM   . BD PEN NEEDLE NANO U/F 32G X 4 MM MISC USE DAILY WITH LANTUS SOLASTAR   . blood glucose meter kit and supplies KIT Dispense based on patient and insurance preference. Use up to three times daily as directed. (FOR ICD-9 250.00, 250.01).   . calcitRIOL (ROCALTROL) 0.5 MCG capsule Take by mouth daily.   . clopidogrel (PLAVIX) 75 MG tablet TAKE 1 TABLET BY MOUTH EVERY DAY   . Cyanocobalamin (B-12 PO) Take 1 tablet by mouth daily.   . diclofenac sodium (VOLTAREN) 1 % GEL Apply 4 g topically 4 (four) times daily as needed.   . doxycycline (VIBRA-TABS) 100 MG tablet Take 1 tablet (100 mg total) by mouth 2 (two) times daily. (Patient not taking: Reported on 04/28/2020) 04/28/2020: Completed  . ferrous sulfate 325 (65 FE) MG tablet TAKE 1 TABLET BY MOUTH TWICE A DAY   . furosemide (LASIX) 80 MG tablet Take 0.5 tablets (40 mg total) by mouth 2 (two) times daily.   Marland Kitchen gabapentin (NEURONTIN) 100 MG capsule TAKE 1 CAPSULE BY MOUTH THREE TIMES A DAY   . hydrOXYzine (ATARAX/VISTARIL) 10 MG tablet Take 1 tablet (10 mg total) by mouth 3 (three) times daily as needed for itching.   . Insulin Glargine (LANTUS SOLOSTAR) 100 UNIT/ML Solostar Pen Inject 18 Units into the skin daily.   . insulin lispro (HUMALOG  KWIKPEN) 100 UNIT/ML KwikPen INEJCT 0-9 UNITS AS DIRECTED INTO SKIN 3 TIMES A DAY   . Lancets MISC Use as directed three times daily with meals   . metoprolol tartrate (LOPRESSOR) 100 MG tablet TAKE 1 TABLET BY MOUTH TWICE A DAY   . MYRBETRIQ 25 MG TB24 tablet Take 25 mg by mouth daily.   . nitroGLYCERIN (NITROSTAT) 0.4 MG SL tablet Place 1 tablet (0.4 mg total) under the tongue every 5 (five) minutes as needed for chest pain. Reported on 04/06/2016   . omeprazole (PRILOSEC) 20 MG capsule TAKE 1 CAPSULE BY MOUTH EVERY DAY *APPT DUE IN FEB FOR REFILLS*   . polyethylene glycol powder (GLYCOLAX/MIRALAX)  powder Take 17 gm by mouth daily   . Potassium Chloride ER 20 MEQ TBCR Take 20 mEq by mouth daily.    . risedronate (ACTONEL) 150 MG tablet TAKE 1 TABLET (150 MG TOTAL) BY MOUTH EVERY 30 (THIRTY) DAYS. FIRST TUESDAY   . sodium bicarbonate 650 MG tablet Take 650 mg by mouth 2 (two) times daily.   . Tetrahydrozoline HCl (VISINE OP) Place 2 drops into both eyes daily as needed (DRY EYE).   Marland Kitchen thiamine (VITAMIN B-1) 50 MG tablet Take 1 tablet (50 mg total) by mouth daily.   . timolol (TIMOPTIC) 0.5 % ophthalmic solution PLACE 1 DROP IN BOTH EYES DAILY   . tiZANidine (ZANAFLEX) 4 MG tablet TAKE 1 TABLET BY MOUTH EVERY 6 HOURS AS NEEDED FOR MUSCLE SPASMS   . traMADol (ULTRAM) 50 MG tablet TAKE 1 TABLET BY MOUTH EVERY 6 HOURS AS NEEDED   . Vitamin D, Ergocalciferol, (DRISDOL) 1.25 MG (50000 UT) CAPS capsule Take 1 capsule (50,000 Units total) by mouth every 7 (seven) days.   Marland Kitchen zolpidem (AMBIEN) 5 MG tablet TAKE 1 TABLET BY MOUTH EVERY DAY AT BEDTIME AS NEEDED FOR SLEEP    No facility-administered encounter medications on file as of 05/27/2020.    Functional Status:  In your present state of health, do you have any difficulty performing the following activities: 04/28/2020 08/06/2019  Hearing? N N  Vision? N N  Difficulty concentrating or making decisions? N N  Walking or climbing stairs? N N  Dressing or bathing? N N  Doing errands, shopping? Y Y  Comment does not drive and daughter assist Assistance from her daughter  Conservation officer, nature and eating ? N N  Using the Toilet? N N  In the past six months, have you accidently leaked urine? N N  Do you have problems with loss of bowel control? N N  Managing your Medications? N N  Managing your Finances? N N  Housekeeping or managing your Housekeeping? N N  Some recent data might be hidden    Fall/Depression Screening: Fall Risk  05/27/2020 05/27/2020 04/28/2020  Falls in the past year? 1 1 0  Number falls in past yr: 1 1 0  Injury with Fall? 0 0 0  Risk  for fall due to : Impaired mobility;Impaired balance/gait;History of fall(s) - -  Follow up Falls prevention discussed;Education provided;Falls evaluation completed Falls prevention discussed;Education provided;Falls evaluation completed Falls prevention discussed;Education provided;Falls evaluation completed   PHQ 2/9 Scores 04/28/2020 08/06/2019 06/03/2019 12/20/2018 05/29/2018 12/13/2017 12/20/2016  PHQ - 2 Score 0 0 0 0 0 0 1  PHQ- 9 Score - - - - 1 - -   Goals Addressed            This Visit's Progress   . Patient will report reduction in A1c by .  3-1 points within the next 90 days       CARE PLAN ENTRY (see longtitudinal plan of care for additional care plan information)  Objective:  Lab Results  Component Value Date   HGBA1C 7.5 (H) 12/23/2019 .   Lab Results  Component Value Date   CREATININE 2.83 (H) 12/23/2019   CREATININE 2.33 (H) 06/20/2019   CREATININE 2.84 (H) 01/25/2019 .   Marland Kitchen No results found for: EGFR  Current Barriers:  Marland Kitchen Knowledge Deficits related to basic Diabetes pathophysiology and self care/management  Case Manager Clinical Goal(s):  Over the next 30 days, patient will demonstrate improved adherence to prescribed treatment plan for diabetes self care/management as evidenced by:  Marland Kitchen Verbalize daily monitoring and recording of CBG within 30 days . Verbalize adherence to ADA/ carb modified diet within the next 30 days . Verbalize exercise 3-4 days/week . Verbalize adherence to prescribed medication regimen within the next 30 days   Interventions:  . Reviewed medications with patient and discussed importance of medication adherence . Discussed plans with patient for ongoing care management follow up and provided patient with direct contact information for care management team . Encouraged patient to continue to monitor blood sugar, carbohydrates, sugar, and to exercise .  Patient Self Care Activities:  . Self administers oral medications as prescribed . Self  administers insulin as prescribed . Attends all scheduled provider appointments . Checks blood sugars as prescribed and utilize hyper and hypoglycemia protocol as needed . Adheres to prescribed ADA/carb modified  Please see past updates related to this goal by clicking on the "Past Updates" button in the selected goal   [place] (see longitudinal plan of care for additional care plan information)  Current Barriers:  Marland Kitchen Knowledge Deficits related to fall precautions  Clinical Goal(s):  Marland Kitchen Over the next 90 days, patient will demonstrate improved adherence to prescribed treatment plan for decreasing falls as evidenced by patient reporting and review of EMR . Over the next 90 days, patient will verbalize using fall risk reduction strategies discussed . Over the next 90 days, patient will not experience additional falls   Interventions:  . Provided written and verbal education re: Potential causes of falls and Fall prevention strategies . Assessed for falls since last encounter. . Assessed patients knowledge of fall risk prevention secondary to previously provided education. . Assessed working status of life alert bracelet and patient adherence . Provided patient information for fall alert systems  Patient Self Care Activities:  . Utilize cane and/or walker appropriately with all ambulation . De-clutter walkways . Change positions slowly . Wear secure fitting shoes at all times with ambulation . Utilize home lighting for dim lit areas . Have self and pet awareness at all times  Plan: . Nurse Health Coach will follow up during next telephone assessment   Initial goal documentation        Plan: RN Health Coach will send patient [employer] contact number to inquire about life alert system and fall prevention education, will call patient within the month of October and patient agrees to future outreach calls.   Emelia Loron RN, BSN [facility] [phone] [email or web address]

## 2020-05-29 ENCOUNTER — Ambulatory Visit: Payer: Self-pay | Admitting: *Deleted

## 2020-06-05 ENCOUNTER — Ambulatory Visit: Payer: Self-pay

## 2020-06-09 ENCOUNTER — Ambulatory Visit (INDEPENDENT_AMBULATORY_CARE_PROVIDER_SITE_OTHER): Payer: Medicare Other

## 2020-06-09 DIAGNOSIS — Z0000 Encounter for general adult medical examination without abnormal findings: Secondary | ICD-10-CM | POA: Diagnosis not present

## 2020-06-09 NOTE — Progress Notes (Signed)
I connected with Emelia Salisbury today by telephone and verified that I am speaking with the correct person using two identifiers. Location patient: home Location provider: work Persons participating in the virtual visit: Hadassa Cermak and Lisette Abu, LPN   I discussed the limitations, risks, security and privacy concerns of performing an evaluation and management service by telephone and the availability of in person appointments. I also discussed with the patient that there may be a patient responsible charge related to this service. The patient expressed understanding and verbally consented to this telephonic visit.    Interactive audio and video telecommunications were attempted between this provider and patient, however failed, due to patient having technical difficulties OR patient did not have access to video capability.  We continued and completed visit with audio only.  Some vital signs may be absent or patient reported.   Time Spent with patient on telephone encounter: 25 minutes  Subjective:   [name] is a 74 y.o. female who presents for Medicare Annual (Subsequent) preventive examination.  Review of Systems    No ROS. Medicare Wellness Virtual Visit Cardiac Risk Factors include: advanced age (>48mn, >>24women);diabetes mellitus;dyslipidemia;family history of premature cardiovascular disease;hypertension;obesity (BMI >30kg/m2)     Objective:    Today's Vitals   06/09/20 1404  PainSc: 0-No pain   There is no height or weight on file to calculate BMI.  Advanced Directives 06/09/2020 04/28/2020 08/06/2019 06/03/2019 08/13/2018 07/26/2018 07/25/2018  Does Patient Have a Medical Advance Directive? No No No No No No No  Does patient want to make changes to medical advance directive? - - - - - - -  Would patient like information on creating a medical advance directive? No - Patient declined No - Patient declined No - Patient declined No - Patient declined No -  Patient declined No - Patient declined -  Pre-existing out of facility DNR order (yellow form or pink MOST form) - - - - - - -    Current Medications (verified) Outpatient Encounter Medications as of 06/09/2020  Medication Sig   ACCU-CHEK AVIVA PLUS test strip USE AS INSTRUCTED THREE TIMES DAILY. DX E11.9   acetaminophen (TYLENOL) 500 MG tablet Take 1-2 tablets (500-1,000 mg total) by mouth every 8 (eight) hours as needed for mild pain.   allopurinol (ZYLOPRIM) 100 MG tablet Take 100 mg by mouth daily.   amLODipine (NORVASC) 10 MG tablet Take 1 tablet (10 mg total) by mouth daily.   aspirin 81 MG tablet Take 81 mg by mouth daily.   atorvastatin (LIPITOR) 80 MG tablet TAKE 1 TABLET BY MOUTH DAILY AT 6PM   BD PEN NEEDLE NANO U/F 32G X 4 MM MISC USE DAILY WITH LANTUS SOLASTAR   blood glucose meter kit and supplies KIT Dispense based on patient and insurance preference. Use up to three times daily as directed. (FOR ICD-9 250.00, 250.01).   calcitRIOL (ROCALTROL) 0.5 MCG capsule Take by mouth daily.   clopidogrel (PLAVIX) 75 MG tablet TAKE 1 TABLET BY MOUTH EVERY DAY   Cyanocobalamin (B-12 PO) Take 1 tablet by mouth daily.   diclofenac sodium (VOLTAREN) 1 % GEL Apply 4 g topically 4 (four) times daily as needed.   doxycycline (VIBRA-TABS) 100 MG tablet Take 1 tablet (100 mg total) by mouth 2 (two) times daily. (Patient not taking: Reported on 04/28/2020)   ferrous sulfate 325 (65 FE) MG tablet TAKE 1 TABLET BY MOUTH TWICE A DAY   furosemide (LASIX) 80 MG tablet Take 0.5 tablets (  40 mg total) by mouth 2 (two) times daily.   gabapentin (NEURONTIN) 100 MG capsule TAKE 1 CAPSULE BY MOUTH THREE TIMES A DAY   hydrOXYzine (ATARAX/VISTARIL) 10 MG tablet Take 1 tablet (10 mg total) by mouth 3 (three) times daily as needed for itching.   Insulin Glargine (LANTUS SOLOSTAR) 100 UNIT/ML Solostar Pen Inject 18 Units into the skin daily.   insulin lispro (HUMALOG KWIKPEN) 100 UNIT/ML KwikPen  INEJCT 0-9 UNITS AS DIRECTED INTO SKIN 3 TIMES A DAY   Lancets MISC Use as directed three times daily with meals   metoprolol tartrate (LOPRESSOR) 100 MG tablet TAKE 1 TABLET BY MOUTH TWICE A DAY   MYRBETRIQ 25 MG TB24 tablet Take 25 mg by mouth daily.   nitroGLYCERIN (NITROSTAT) 0.4 MG SL tablet Place 1 tablet (0.4 mg total) under the tongue every 5 (five) minutes as needed for chest pain. Reported on 04/06/2016   omeprazole (PRILOSEC) 20 MG capsule TAKE 1 CAPSULE BY MOUTH EVERY DAY *APPT DUE IN FEB FOR REFILLS*   polyethylene glycol powder (GLYCOLAX/MIRALAX) powder Take 17 gm by mouth daily   Potassium Chloride ER 20 MEQ TBCR Take 20 mEq by mouth daily.    risedronate (ACTONEL) 150 MG tablet TAKE 1 TABLET (150 MG TOTAL) BY MOUTH EVERY 30 (THIRTY) DAYS. FIRST TUESDAY   sodium bicarbonate 650 MG tablet Take 650 mg by mouth 2 (two) times daily.   Tetrahydrozoline HCl (VISINE OP) Place 2 drops into both eyes daily as needed (DRY EYE).   thiamine (VITAMIN B-1) 50 MG tablet Take 1 tablet (50 mg total) by mouth daily.   timolol (TIMOPTIC) 0.5 % ophthalmic solution PLACE 1 DROP IN BOTH EYES DAILY   tiZANidine (ZANAFLEX) 4 MG tablet TAKE 1 TABLET BY MOUTH EVERY 6 HOURS AS NEEDED FOR MUSCLE SPASMS   traMADol (ULTRAM) 50 MG tablet TAKE 1 TABLET BY MOUTH EVERY 6 HOURS AS NEEDED   Vitamin D, Ergocalciferol, (DRISDOL) 1.25 MG (50000 UT) CAPS capsule Take 1 capsule (50,000 Units total) by mouth every 7 (seven) days.   zolpidem (AMBIEN) 5 MG tablet TAKE 1 TABLET BY MOUTH EVERY DAY AT BEDTIME AS NEEDED FOR SLEEP   No facility-administered encounter medications on file as of 06/09/2020.    Allergies (verified) Patient has no known allergies.   History: Past Medical History:  Diagnosis Date   Anemia, iron deficiency    Aortic atherosclerosis ([place]) 11/17/2017   Arthritis    CAD (coronary artery disease)    Carotid stenosis    Carotid US (02/2014):  Bilateral ICA 40-59%; > 50% L ECA;  F/u 1 year   Cataract    removed both eyes   Cholelithiasis 11/17/2017   Chronic kidney disease    chronic renal failure   DM2 (diabetes mellitus, type 2) (HCC)    GERD (gastroesophageal reflux disease)    Glaucoma    HLD (hyperlipidemia)    HTN (hypertension)    Hx of cardiovascular stress test    Lexiscan Myoview (03/24/14):  No ischemia, EF 73%, Normal   Hypoglycemia 06/27/2017   Mitral regurgitation    Osteopenia 07/21/2017   Osteoporosis    PUD (peptic ulcer disease)    PVD (peripheral vascular disease) (HCC)    Shortness of breath    with exertion   Past Surgical History:  Procedure Laterality Date   ABDOMINAL AORTOGRAM W/LOWER EXTREMITY N/A 08/13/2018   Procedure: ABDOMINAL AORTOGRAM W/LOWER EXTREMITY;  Surgeon: Waynetta Sandy, MD;  Location: [facility];  Service: Cardiovascular;  Laterality: N/A;   ABDOMINAL HYSTERECTOMY     APPENDECTOMY     AV FISTULA PLACEMENT Right 01/18/2013   Procedure: ARTERIOVENOUS (AV) FISTULA CREATION;  Surgeon: Rosetta Posner, MD;  Location: [facility];  Service: Vascular;  Laterality: Right;  Ultrasound guided   COLONOSCOPY     CORONARY ANGIOPLASTY WITH STENT PLACEMENT     PERIPHERAL VASCULAR INTERVENTION Bilateral 08/13/2018   Procedure: PERIPHERAL VASCULAR INTERVENTION;  Surgeon: Waynetta Sandy, MD;  Location: [facility];  Service: Cardiovascular;  Laterality: Bilateral;   RENAL ANGIOGRAM N/A 09/16/2011   Procedure: RENAL ANGIOGRAM;  Surgeon: Sherren Mocha, MD;  Location: [facility];  Service: Cardiovascular;  Laterality: N/A;   Family History  Problem Relation Age of Onset   Heart disease Father    Glaucoma Father    Diabetes Sister    Diabetes Son    Colon cancer Neg Hx    Colon polyps Neg Hx    Esophageal cancer Neg Hx    Rectal cancer Neg Hx    Stomach cancer Neg Hx    Social History   Socioeconomic History   Marital status: Single    Spouse name: Not on file     Number of children: 3   Years of education: Not on file   Highest education level: Not on file  Occupational History   Occupation: homemaker  Tobacco Use   Smoking status: Former Smoker    Types: Cigarettes    Quit date: 10/25/2007    Years since quitting: 12.6   Smokeless tobacco: Never Used  Scientific laboratory technician Use: Never used  Substance and Sexual Activity   Alcohol use: No   Drug use: No   Sexual activity: Never  Other Topics Concern   Not on file  Social History Narrative   Not on file   Social Determinants of Health   Financial Resource Strain: Low Risk    Difficulty of Paying Living Expenses: Not hard at all  Food Insecurity: No Food Insecurity   Worried About Charity fundraiser in the Last Year: Never true   [place] in the Last Year: Never true  Transportation Needs: No Transportation Needs   Lack of Transportation (Medical): No   Lack of Transportation (Non-Medical): No  Physical Activity: Inactive   Days of Exercise per Week: 0 days   Minutes of Exercise per Session: 0 min  Stress: No Stress Concern Present   Feeling of Stress : Not at all  Social Connections:    Frequency of Communication with Friends and Family:    Frequency of Social Gatherings with Friends and Family:    Attends Religious Services:    Active Member of Clubs or Organizations:    Attends Music therapist:    Marital Status:     Tobacco Counseling Counseling given: Not Answered   Clinical Intake:  Pre-visit preparation completed: Yes  Pain : No/denies pain Pain Score: 0-No pain     Nutritional Risks: None Diabetes: Yes CBG done?: No Did pt. bring in CBG monitor from home?: No  How often do you need to have someone help you when you read instructions, pamphlets, or other written materials from your doctor or pharmacy?: 2 - Rarely What is the last grade level you completed in school?: 8th grade  Diabetic? yes  Interpreter  Needed?: No  Information entered by :: Ceil Roderick N. Alexa Blish, LPN   Activities of Daily Living In your present state of health,  do you have any difficulty performing the following activities: 06/09/2020 04/28/2020  Hearing? N N  Vision? N N  Difficulty concentrating or making decisions? N N  Walking or climbing stairs? Y N  Comment help with going up the stairs -  Dressing or bathing? N N  Doing errands, shopping? Y Y  Comment - does not drive and daughter [place] and eating ? N N  Using the Toilet? N N  In the past six months, have you accidently leaked urine? Y N  Comment wears a Depend for protection -  Do you have problems with loss of bowel control? N N  Managing your Medications? N N  Managing your Finances? N N  Housekeeping or managing your Housekeeping? Y N  Comment lives with daughter and grandson -  Some recent data might be hidden    Patient Care Team: [name], MD as PCP - Cyndia Diver, MD as PCP - Cardiology (Cardiology) Edrick Oh, MD as Consulting Physician (Nephrology) Gatha Mayer, MD as Consulting Physician (Gastroenterology) Marzetta Board, DPM as Consulting Physician (Podiatry) Monna Fam, MD as Consulting Physician (Ophthalmology) Laretta Alstrom Argie Ramming, RN as [place] any recent [place] you may have received from other than [facility] providers in the past year (date may be approximate).     Assessment:   This is a routine wellness examination for [place].  Hearing/Vision screen No exam data present  Dietary issues and exercise activities discussed: Current Exercise Habits: The patient does not participate in regular exercise at present, Intensity: Other (Comment) (per patient not active at this moment), Exercise limited by: respiratory conditions(s) (SOB with exertion; not active at this time)  Goals     Patient Stated     Stay as active and as independent as  possible. I want to become more active socially by checking out [facility] or [facility].     Patient will report reduction in A1c by .3-1 points within the next 90 days     [place] (see longtitudinal plan of care for additional care plan information)  Objective:  Lab Results  Component Value Date   HGBA1C 7.5 (H) 12/23/2019    Lab Results  Component Value Date   CREATININE 2.83 (H) 12/23/2019   CREATININE 2.33 (H) 06/20/2019   CREATININE 2.84 (H) 01/25/2019     No results found for: EGFR  Current Barriers:   Knowledge Deficits related to basic Diabetes pathophysiology and self care/management  Case Manager Clinical Goal(s):  Over the next 30 days, patient will demonstrate improved adherence to prescribed treatment plan for diabetes self care/management as evidenced by:   Verbalize daily monitoring and recording of CBG within 30 days  Verbalize adherence to ADA/ carb modified diet within the next 30 days  Verbalize exercise 3-4 days/week  Verbalize adherence to prescribed medication regimen within the next 30 days   Interventions:   Reviewed medications with patient and discussed importance of medication adherence  Discussed plans with patient for ongoing care management follow up and provided patient with direct contact information for care management team  Encouraged patient to continue to monitor blood sugar, carbohydrates, sugar, and to exercise .  Patient Self Care Activities:   Self administers oral medications as prescribed  Self administers insulin as prescribed  Attends all scheduled provider appointments  Checks blood sugars as prescribed and utilize hyper and hypoglycemia protocol as needed  Adheres to prescribed ADA/carb modified  Please see past updates related to this goal by clicking on the "Past Updates" button in the selected goal   [place] (see longitudinal plan of care for additional care plan information)  Current  Barriers:   Knowledge Deficits related to fall precautions  Clinical Goal(s):   Over the next 90 days, patient will demonstrate improved adherence to prescribed treatment plan for decreasing falls as evidenced by patient reporting and review of EMR  Over the next 90 days, patient will verbalize using fall risk reduction strategies discussed  Over the next 90 days, patient will not experience additional falls   Interventions:   Provided written and verbal education re: Potential causes of falls and Fall prevention strategies  Assessed for falls since last encounter.  Assessed patients knowledge of fall risk prevention secondary to previously provided education.  Assessed working status of life alert bracelet and patient adherence  Provided patient information for fall alert systems  Patient Self Care Activities:   Utilize cane and/or walker appropriately with all ambulation  De-clutter walkways  Change positions slowly  Wear secure fitting shoes at all times with ambulation  Utilize home lighting for dim lit areas  Have self and pet awareness at all times  Plan:  [place] will follow up during next telephone assessment   Initial goal documentation       Start to walk with my daughter x 2 weekly      Depression Screen PHQ 2/9 Scores 06/09/2020 04/28/2020 08/06/2019 06/03/2019 12/20/2018 05/29/2018 12/13/2017  PHQ - 2 Score 0 0 0 0 0 0 0  PHQ- 9 Score - - - - - 1 -    Fall Risk Fall Risk  06/09/2020 05/27/2020 05/27/2020 04/28/2020 08/06/2019  Falls in the past year? '1 1 1 '$ 0 0  Number falls in past yr: '1 1 1 '$ 0 -  Injury with Fall? 0 0 0 0 -  Risk for fall due to : History of fall(s);Impaired balance/gait;Impaired mobility Impaired mobility;Impaired balance/gait;History of fall(s) - - -  Follow up Falls evaluation completed;Falls prevention discussed Falls prevention discussed;Education provided;Falls evaluation completed Falls prevention discussed;Education  provided;Falls evaluation completed Falls prevention discussed;Education provided;Falls evaluation completed -    Any stairs in or around the home? Yes  If so, are there any without handrails? No  Home free of loose throw rugs in walkways, pet beds, electrical cords, etc? Yes  Adequate lighting in your home to reduce risk of falls? Yes   ASSISTIVE DEVICES UTILIZED TO PREVENT FALLS:  Life alert? No  Use of a cane, walker or w/c? Yes  Grab bars in the bathroom? Yes  Shower chair or bench in shower? Yes  Elevated toilet seat or a handicapped toilet? No   TIMED UP AND GO:  Was the test performed? No .  Length of time to ambulate 10 feet: 0 sec.   Gait slow and steady with assistive device  Cognitive Function: Patient is cogitatively intact. MMSE - Mini Mental State Exam 06/09/2020  Not completed: Unable to complete        Immunizations Immunization History  Administered Date(s) Administered   Fluad Quad(high Dose 65+) 06/20/2019   H1N1 12/18/2008   Influenza Whole 09/30/2009, 10/07/2010   Influenza, High Dose Seasonal PF 07/06/2017, 08/02/2018   Influenza, Seasonal, Injecte, Preservative Fre 08/02/2013   Influenza-Unspecified 07/25/2015, 08/25/2015   PFIZER SARS-COV-2 Vaccination 12/15/2019, 01/08/2020   Pneumococcal Conjugate-13 09/13/2013   Pneumococcal Polysaccharide-23 06/05/2006, 05/11/2011   Td 12/18/2008   Tdap 12/20/2018  TDAP status: Up to date Flu Vaccine status: Up to date Pneumococcal vaccine status: Up to date Covid-19 vaccine status: Completed vaccines  Qualifies for Shingles Vaccine? Yes   Zostavax completed No   Shingrix Completed?: No.    Education has been provided regarding the importance of this vaccine. Patient has been advised to call insurance company to determine out of pocket expense if they have not yet received this vaccine. Advised may also receive vaccine at local pharmacy or Health Dept. Verbalized acceptance and  understanding.  Screening Tests Health Maintenance  Topic Date Due   FOOT EXAM  12/21/2019   INFLUENZA VACCINE  05/24/2020   URINE MICROALBUMIN  06/19/2020   HEMOGLOBIN A1C  06/24/2020   OPHTHALMOLOGY EXAM  09/11/2020   TETANUS/TDAP  12/20/2028   DEXA SCAN  Completed   COVID-19 Vaccine  Completed   Hepatitis C Screening  Completed   PNA vac Low Risk Adult  Completed    Health Maintenance  Health Maintenance Due  Topic Date Due   FOOT EXAM  12/21/2019   INFLUENZA VACCINE  05/24/2020   URINE MICROALBUMIN  06/19/2020    Colorectal cancer screening: No longer required.  Mammogram status: No longer required.  Bone Density status: Completed 07/20/2017. Results reflect: Bone density results: OSTEOPENIA. Repeat every 1-2 years.  Lung Cancer Screening: (Low Dose CT Chest recommended if Age 37-80 years, 30 pack-year currently smoking OR have quit w/in 15years.) does not qualify.   Lung Cancer Screening Referral: no  Additional Screening:  Hepatitis C Screening: does qualify; Completed yes  Vision Screening: Recommended annual ophthalmology exams for early detection of glaucoma and other disorders of the eye. Is the patient up to date with their annual eye exam?  Yes  Who is the provider or what is the name of the office in which the patient attends annual eye exams? Monna Fam, MD If pt is not established with a provider, would they like to be referred to a provider to establish care? No .   Dental Screening: Recommended annual dental exams for proper oral hygiene  Community Resource Referral / Chronic Care Management: CRR required this visit?  No   CCM required this visit?  No      Plan:     I have personally reviewed and noted the following in the patients chart:    Medical and social history  Use of alcohol, tobacco or illicit drugs   Current medications and supplements  Functional ability and status  Nutritional status  Physical  activity  Advanced directives  List of other physicians  Hospitalizations, surgeries, and ER visits in previous 12 months  Vitals  Screenings to include cognitive, depression, and falls  Referrals and appointments  In addition, I have reviewed and discussed with patient certain preventive protocols, quality metrics, and best practice recommendations. A written personalized care plan for preventive services as well as general preventive health recommendations were provided to patient.     Sheral Flow, LPN   [id number]   Nurse Notes:  Patient is cogitatively intact. There were no vitals filed for this visit. There is no height or weight on file to calculate BMI. Patient stated that she has issues with gait and balance; she does use a cane and walker very slowly for assistive devices.

## 2020-06-09 NOTE — Patient Instructions (Signed)
[name] , Thank you for taking time to come for your Medicare Wellness Visit. I appreciate your ongoing commitment to your health goals. Please review the following plan we discussed and let me know if I can assist you in the future.   Screening recommendations/referrals: Colonoscopy: no repeat due to age 74: no repeat due to age Bone Density: 07/20/2017; due every 1-2 years Recommended yearly ophthalmology/optometry visit for glaucoma screening and checkup Recommended yearly dental visit for hygiene and checkup  Vaccinations: Influenza vaccine: 06/20/2019 Pneumococcal vaccine: completed Tdap vaccine: 12/20/2018 Shingles vaccine: never done   Covid-19: completed  Advanced directives: Advance directive discussed with you today. Even though you declined this today please call our office should you change your mind and we can give you the proper paperwork for you to fill out.  Conditions/risks identified: Yes; Please continue to do your personal lifestyle choices by: daily care of teeth and gums, regular physical activity (goal should be 5 days a week for 30 minutes), eat a healthy diet, avoid tobacco and drug use, limiting any alcohol intake, taking a low-dose aspirin (if not allergic or have been advised by your provider otherwise) and taking vitamins and minerals as recommended by your provider. Continue doing brain stimulating activities (puzzles, reading, adult coloring books, staying active) to keep memory sharp. Continue to eat heart healthy diet (full of fruits, vegetables, whole grains, lean protein, water--limit salt, fat, and sugar intake) and increase physical activity as tolerated.  Next appointment: Please schedule your next Medicare Wellness Visit with your Nurse Health Advisor in 1 year.   Preventive Care 46 Years and Older, Female Preventive care refers to lifestyle choices and visits with your health care provider that can promote health and wellness. What does  preventive care include?  A yearly physical exam. This is also called an annual well check.  Dental exams once or twice a year.  Routine eye exams. Ask your health care provider how often you should have your eyes checked.  Personal lifestyle choices, including:  Daily care of your teeth and gums.  Regular physical activity.  Eating a healthy diet.  Avoiding tobacco and drug use.  Limiting alcohol use.  Practicing safe sex.  Taking low-dose aspirin every day.  Taking vitamin and mineral supplements as recommended by your health care provider. What happens during an annual well check? The services and screenings done by your health care provider during your annual well check will depend on your age, overall health, lifestyle risk factors, and family history of disease. Counseling  Your health care provider may ask you questions about your:  Alcohol use.  Tobacco use.  Drug use.  Emotional well-being.  Home and relationship well-being.  Sexual activity.  Eating habits.  History of falls.  Memory and ability to understand (cognition).  Work and work Statistician.  Reproductive health. Screening  You may have the following tests or measurements:  Height, weight, and BMI.  Blood pressure.  Lipid and cholesterol levels. These may be checked every 5 years, or more frequently if you are over 35 years old.  Skin check.  Lung cancer screening. You may have this screening every year starting at age 48 if you have a 30-pack-year history of smoking and currently smoke or have quit within the past 15 years.  Fecal occult blood test (FOBT) of the stool. You may have this test every year starting at age 41.  Flexible sigmoidoscopy or colonoscopy. You may have a sigmoidoscopy every 5 years or a colonoscopy  every 10 years starting at age 41.  Hepatitis C blood test.  Hepatitis B blood test.  Sexually transmitted disease (STD) testing.  Diabetes screening. This  is done by checking your blood sugar (glucose) after you have not eaten for a while (fasting). You may have this done every 1-3 years.  Bone density scan. This is done to screen for osteoporosis. You may have this done starting at age 34.  Mammogram. This may be done every 1-2 years. Talk to your health care provider about how often you should have regular mammograms. Talk with your health care provider about your test results, treatment options, and if necessary, the need for more tests. Vaccines  Your health care provider may recommend certain vaccines, such as:  Influenza vaccine. This is recommended every year.  Tetanus, diphtheria, and acellular pertussis (Tdap, Td) vaccine. You may need a Td booster every 10 years.  Zoster vaccine. You may need this after age 40.  Pneumococcal 13-valent conjugate (PCV13) vaccine. One dose is recommended after age 45.  Pneumococcal polysaccharide (PPSV23) vaccine. One dose is recommended after age 73. Talk to your health care provider about which screenings and vaccines you need and how often you need them. This information is not intended to replace advice given to you by your health care provider. Make sure you discuss any questions you have with your health care provider. Document Released: 11/06/2015 Document Revised: 06/29/2016 Document Reviewed: 08/11/2015 Elsevier Interactive Patient Education  2017 [employer] Prevention in the Home Falls can cause injuries. They can happen to people of all ages. There are many things you can do to make your home safe and to help prevent falls. What can I do on the outside of my home?  Regularly fix the edges of walkways and driveways and fix any cracks.  Remove anything that might make you trip as you walk through a door, such as a raised step or threshold.  Trim any bushes or trees on the path to your home.  Use bright outdoor lighting.  Clear any walking paths of anything that might make  someone trip, such as rocks or tools.  Regularly check to see if handrails are loose or broken. Make sure that both sides of any steps have handrails.  Any raised decks and porches should have guardrails on the edges.  Have any leaves, snow, or ice cleared regularly.  Use sand or salt on walking paths during winter.  Clean up any spills in your garage right away. This includes oil or grease spills. What can I do in the bathroom?  Use night lights.  Install grab bars by the toilet and in the tub and shower. Do not use towel bars as grab bars.  Use non-skid mats or decals in the tub or shower.  If you need to sit down in the shower, use a plastic, non-slip stool.  Keep the floor dry. Clean up any water that spills on the floor as soon as it happens.  Remove soap buildup in the tub or shower regularly.  Attach bath mats securely with double-sided non-slip rug tape.  Do not have throw rugs and other things on the floor that can make you trip. What can I do in the bedroom?  Use night lights.  Make sure that you have a light by your bed that is easy to reach.  Do not use any sheets or blankets that are too big for your bed. They should not hang down onto the floor.  Have a firm chair that has side arms. You can use this for support while you get dressed.  Do not have throw rugs and other things on the floor that can make you trip. What can I do in the kitchen?  Clean up any spills right away.  Avoid walking on wet floors.  Keep items that you use a lot in easy-to-reach places.  If you need to reach something above you, use a strong step stool that has a grab bar.  Keep electrical cords out of the way.  Do not use floor polish or wax that makes floors slippery. If you must use wax, use non-skid floor wax.  Do not have throw rugs and other things on the floor that can make you trip. What can I do with my stairs?  Do not leave any items on the stairs.  Make sure that  there are handrails on both sides of the stairs and use them. Fix handrails that are broken or loose. Make sure that handrails are as long as the stairways.  Check any carpeting to make sure that it is firmly attached to the stairs. Fix any carpet that is loose or worn.  Avoid having throw rugs at the top or bottom of the stairs. If you do have throw rugs, attach them to the floor with carpet tape.  Make sure that you have a light switch at the top of the stairs and the bottom of the stairs. If you do not have them, ask someone to add them for you. What else can I do to help prevent falls?  Wear shoes that:  Do not have high heels.  Have rubber bottoms.  Are comfortable and fit you well.  Are closed at the toe. Do not wear sandals.  If you use a stepladder:  Make sure that it is fully opened. Do not climb a closed stepladder.  Make sure that both sides of the stepladder are locked into place.  Ask someone to hold it for you, if possible.  Clearly mark and make sure that you can see:  Any grab bars or handrails.  First and last steps.  Where the edge of each step is.  Use tools that help you move around (mobility aids) if they are needed. These include:  Canes.  Walkers.  Scooters.  Crutches.  Turn on the lights when you go into a dark area. Replace any light bulbs as soon as they burn out.  Set up your furniture so you have a clear path. Avoid moving your furniture around.  If any of your floors are uneven, fix them.  If there are any pets around you, be aware of where they are.  Review your medicines with your doctor. Some medicines can make you feel dizzy. This can increase your chance of falling. Ask your doctor what other things that you can do to help prevent falls. This information is not intended to replace advice given to you by your health care provider. Make sure you discuss any questions you have with your health care provider. Document Released:  08/06/2009 Document Revised: 03/17/2016 Document Reviewed: 11/14/2014 Elsevier Interactive Patient Education  2017 [employer].

## 2020-06-11 ENCOUNTER — Other Ambulatory Visit: Payer: Self-pay | Admitting: Internal Medicine

## 2020-06-19 ENCOUNTER — Other Ambulatory Visit: Payer: Self-pay

## 2020-06-19 ENCOUNTER — Encounter (HOSPITAL_COMMUNITY)
Admission: RE | Admit: 2020-06-19 | Discharge: 2020-06-19 | Disposition: A | Discharge status: Home / Self Care | Payer: Medicare Other | Source: Ambulatory Visit | Attending: Nephrology | Admitting: Nephrology

## 2020-06-19 VITALS — BP 158/69 | HR 65 | Temp 96.6°F | Resp 20

## 2020-06-19 DIAGNOSIS — D539 Nutritional anemia, unspecified: Secondary | ICD-10-CM | POA: Insufficient documentation

## 2020-06-19 LAB — IRON AND TIBC
Iron: 68 ug/dL (ref 28–170)
Saturation Ratios: 25 % (ref 10.4–31.8)
TIBC: 270 ug/dL (ref 250–450)
UIBC: 202 ug/dL

## 2020-06-19 LAB — POCT HEMOGLOBIN-HEMACUE: Hemoglobin: 12 g/dL (ref 12.0–15.0)

## 2020-06-19 LAB — FERRITIN: Ferritin: 929 ng/mL — ABNORMAL HIGH (ref 11–307)

## 2020-06-19 MED ORDER — EPOETIN ALFA 10000 UNIT/ML IJ SOLN: 10000.0000 [IU] | INTRAMUSCULAR | Status: DC

## 2020-06-24 ENCOUNTER — Ambulatory Visit: Payer: Medicare Other | Admitting: Internal Medicine

## 2020-06-24 DIAGNOSIS — Z0289 Encounter for other administrative examinations: Secondary | ICD-10-CM

## 2020-06-25 ENCOUNTER — Other Ambulatory Visit: Payer: Self-pay | Admitting: Internal Medicine

## 2020-06-25 NOTE — Telephone Encounter (Signed)
Please refill as per office routine med refill policy (all routine meds refilled for 3 mo or monthly per pt preference up to one year from last visit, then month to month grace period for 3 mo, then further med refills will have to be denied)  

## 2020-06-26 ENCOUNTER — Encounter: Payer: Self-pay | Admitting: Internal Medicine

## 2020-06-26 ENCOUNTER — Other Ambulatory Visit: Payer: Self-pay | Admitting: Internal Medicine

## 2020-06-26 ENCOUNTER — Other Ambulatory Visit: Payer: Self-pay

## 2020-06-26 ENCOUNTER — Ambulatory Visit (INDEPENDENT_AMBULATORY_CARE_PROVIDER_SITE_OTHER): Payer: Medicare Other | Admitting: Internal Medicine

## 2020-06-26 VITALS — BP 136/84 | HR 71 | Temp 97.9°F | Ht 61.0 in | Wt 231.0 lb

## 2020-06-26 DIAGNOSIS — Z0000 Encounter for general adult medical examination without abnormal findings: Secondary | ICD-10-CM | POA: Diagnosis not present

## 2020-06-26 DIAGNOSIS — E118 Type 2 diabetes mellitus with unspecified complications: Secondary | ICD-10-CM | POA: Diagnosis not present

## 2020-06-26 MED ORDER — ACCU-CHEK AVIVA PLUS VI STRP: ORAL_STRIP | 5 refills | Status: AC

## 2020-06-26 MED ORDER — BD PEN NEEDLE NANO U/F 32G X 4 MM MISC: 11 refills | Status: AC

## 2020-06-26 MED ORDER — ACCU-CHEK AVIVA DEVI: 0 refills | Status: AC

## 2020-06-26 NOTE — Patient Instructions (Signed)

## 2020-06-26 NOTE — Progress Notes (Signed)
Subjective:    Patient ID: [name], female    DOB: April 02, 1946, 74 y.o.   MRN: [id number]  HPI   Here for wellness and f/u;  Overall doing ok;  Pt denies Chest pain, worsening SOB, DOE, wheezing, orthopnea, PND, worsening LE edema, palpitations, dizziness or syncope.  Pt denies neurological change such as new headache, facial or extremity weakness.  Pt denies polydipsia, polyuria, or low sugar symptoms. Pt states overall good compliance with treatment and medications, good tolerability, and has been trying to follow appropriate diet.  Pt denies worsening depressive symptoms, suicidal ideation or panic. No fever, night sweats, wt loss, loss of appetite, or other constitutional symptoms.  Pt states good ability with ADL's, has low fall risk, home safety reviewed and adequate, no other significant changes in hearing or vision, and only occasionally active with exercise.  Has mild right CTS like discomofort and numbness better after a few hours in the am Past Medical History:  Diagnosis Date  . Anemia, iron deficiency   . Aortic atherosclerosis ([place]) 11/17/2017  . Arthritis   . CAD (coronary artery disease)   . Carotid stenosis    Carotid US (02/2014):  Bilateral ICA 40-59%; > 50% L ECA; F/u 1 year  . Cataract    removed both eyes  . Cholelithiasis 11/17/2017  . Chronic kidney disease    chronic renal failure  . DM2 (diabetes mellitus, type 2) ([place])   . GERD (gastroesophageal reflux disease)   . Glaucoma   . HLD (hyperlipidemia)   . HTN (hypertension)   . Hx of cardiovascular stress test    Lexiscan Myoview (03/24/14):  No ischemia, EF 73%, Normal  . Hypoglycemia 06/27/2017  . Mitral regurgitation   . Osteopenia 07/21/2017  . Osteoporosis   . PUD (peptic ulcer disease)   . PVD (peripheral vascular disease) ([place])   . Shortness of breath    with exertion   Past Surgical History:  Procedure Laterality Date  . ABDOMINAL AORTOGRAM W/LOWER EXTREMITY N/A 08/13/2018   Procedure:  ABDOMINAL AORTOGRAM W/LOWER EXTREMITY;  Surgeon: Waynetta Sandy, MD;  Location: [facility];  Service: Cardiovascular;  Laterality: N/A;  . ABDOMINAL HYSTERECTOMY    . APPENDECTOMY    . AV FISTULA PLACEMENT Right 01/18/2013   Procedure: ARTERIOVENOUS (AV) FISTULA CREATION;  Surgeon: Rosetta Posner, MD;  Location: [facility];  Service: Vascular;  Laterality: Right;  Ultrasound guided  . COLONOSCOPY    . CORONARY ANGIOPLASTY WITH STENT PLACEMENT    . PERIPHERAL VASCULAR INTERVENTION Bilateral 08/13/2018   Procedure: PERIPHERAL VASCULAR INTERVENTION;  Surgeon: Waynetta Sandy, MD;  Location: [facility];  Service: Cardiovascular;  Laterality: Bilateral;  . RENAL ANGIOGRAM N/A 09/16/2011   Procedure: RENAL ANGIOGRAM;  Surgeon: Sherren Mocha, MD;  Location: [facility];  Service: Cardiovascular;  Laterality: N/A;    reports that she quit smoking about 12 years ago. Her smoking use included cigarettes. She has never used smokeless tobacco. She reports that she does not drink alcohol and does not use drugs. family history includes Diabetes in her sister and son; Glaucoma in her father; Heart disease in her father. No Known Allergies Current Outpatient Medications on File Prior to Visit  Medication Sig Dispense Refill  . acetaminophen (TYLENOL) 500 MG tablet Take 1-2 tablets (500-1,000 mg total) by mouth every 8 (eight) hours as needed for mild pain.    Marland Kitchen allopurinol (ZYLOPRIM) 100 MG tablet Take 100 mg by mouth daily.    Marland Kitchen  aspirin 81 MG tablet Take 81 mg by mouth daily.    Marland Kitchen atorvastatin (LIPITOR) 80 MG tablet TAKE 1 TABLET BY MOUTH DAILY AT 6PM 90 tablet 3  . blood glucose meter kit and supplies KIT Dispense based on patient and insurance preference. Use up to three times daily as directed. (FOR ICD-9 250.00, 250.01). 1 each 0  . calcitRIOL (ROCALTROL) 0.5 MCG capsule Take by mouth daily.    . clopidogrel (PLAVIX) 75 MG tablet TAKE 1 TABLET BY MOUTH EVERY DAY 90 tablet 1    . Cyanocobalamin (B-12 PO) Take 1 tablet by mouth daily.    . diclofenac sodium (VOLTAREN) 1 % GEL Apply 4 g topically 4 (four) times daily as needed. 400 g 11  . doxycycline (VIBRA-TABS) 100 MG tablet Take 1 tablet (100 mg total) by mouth 2 (two) times daily. 20 tablet 0  . ferrous sulfate 325 (65 FE) MG tablet TAKE 1 TABLET BY MOUTH TWICE A DAY 180 tablet 1  . furosemide (LASIX) 80 MG tablet Take 0.5 tablets (40 mg total) by mouth 2 (two) times daily.    Marland Kitchen gabapentin (NEURONTIN) 100 MG capsule TAKE 1 CAPSULE BY MOUTH THREE TIMES A DAY 90 capsule 5  . hydrOXYzine (ATARAX/VISTARIL) 10 MG tablet Take 1 tablet (10 mg total) by mouth 3 (three) times daily as needed for itching. 30 tablet 0  . Insulin Glargine (LANTUS SOLOSTAR) 100 UNIT/ML Solostar Pen Inject 18 Units into the skin daily. 5 pen 11  . insulin lispro (HUMALOG KWIKPEN) 100 UNIT/ML KwikPen INEJCT 0-9 UNITS AS DIRECTED INTO SKIN 3 TIMES A DAY 15 mL 3  . Lancets MISC Use as directed three times daily with meals 100 each 3  . metoprolol tartrate (LOPRESSOR) 100 MG tablet TAKE 1 TABLET BY MOUTH TWICE A DAY 180 tablet 2  . MYRBETRIQ 25 MG TB24 tablet Take 25 mg by mouth daily.    . nitroGLYCERIN (NITROSTAT) 0.4 MG SL tablet Place 1 tablet (0.4 mg total) under the tongue every 5 (five) minutes as needed for chest pain. Reported on 04/06/2016 25 tablet 5  . omeprazole (PRILOSEC) 20 MG capsule TAKE 1 CAPSULE BY MOUTH EVERY DAY *APPT DUE IN FEB FOR REFILLS* 90 capsule 0  . polyethylene glycol powder (GLYCOLAX/MIRALAX) powder Take 17 gm by mouth daily 3350 g 11  . Potassium Chloride ER 20 MEQ TBCR Take 20 mEq by mouth daily.   6  . risedronate (ACTONEL) 150 MG tablet TAKE 1 TABLET (150 MG TOTAL) BY MOUTH EVERY 30 (THIRTY) DAYS. FIRST TUESDAY 3 tablet 3  . sodium bicarbonate 650 MG tablet Take 650 mg by mouth 2 (two) times daily.    . Tetrahydrozoline HCl (VISINE OP) Place 2 drops into both eyes daily as needed (DRY EYE).    Marland Kitchen thiamine (VITAMIN  B-1) 50 MG tablet Take 1 tablet (50 mg total) by mouth daily. 90 tablet 1  . timolol (TIMOPTIC) 0.5 % ophthalmic solution PLACE 1 DROP IN BOTH EYES DAILY    . tiZANidine (ZANAFLEX) 4 MG tablet TAKE 1 TABLET BY MOUTH EVERY 6 HOURS AS NEEDED FOR MUSCLE SPASMS 360 tablet 1  . traMADol (ULTRAM) 50 MG tablet TAKE 1 TABLET BY MOUTH EVERY 6 HOURS AS NEEDED 120 tablet 2  . Vitamin D, Ergocalciferol, (DRISDOL) 1.25 MG (50000 UT) CAPS capsule Take 1 capsule (50,000 Units total) by mouth every 7 (seven) days. 12 capsule 0  . zolpidem (AMBIEN) 5 MG tablet TAKE 1 TABLET BY MOUTH EVERY DAY AT BEDTIME  AS NEEDED FOR SLEEP 30 tablet 5   No current facility-administered medications on file prior to visit.   Review of Systems All otherwise neg per pt     Objective:   Physical Exam BP 136/84   Pulse 71   Temp 97.9 F (36.6 C) (Oral)   Ht _0  (1.549 m)   Wt 231 lb (104.8 kg)   SpO2 95%   BMI 43.65 kg/m  VS noted,  Constitutional: Pt appears in NAD HENT: Head: NCAT.  Right Ear: External ear normal.  Left Ear: External ear normal.  Eyes: . Pupils are equal, round, and reactive to light. Conjunctivae and EOM are normal Nose: without d/c or deformity Neck: Neck supple. Gross normal ROM Cardiovascular: Normal rate and regular rhythm.   Pulmonary/Chest: Effort normal and breath sounds without rales or wheezing.  Abd:  Soft, NT, ND, + BS, no organomegaly Neurological: Pt is alert. At baseline orientation, motor grossly intact Skin: Skin is warm. No rashes, other new lesions, no LE edema Psychiatric: Pt behavior is normal without agitation  All otherwise neg per pt Lab Results  Component Value Date   WBC 12.2 (H) 06/26/2020   HGB 12.8 06/26/2020   HCT 39.2 06/26/2020   PLT 407 (H) 06/26/2020   GLUCOSE 87 06/26/2020   CHOL 175 06/26/2020   TRIG 207 (H) 06/26/2020   HDL 40 (L) 06/26/2020   LDLDIRECT 101.0 06/20/2019   LDLCALC 103 (H) 06/26/2020   ALT 14 06/26/2020   AST 13 06/26/2020   NA 143  06/26/2020   K 3.4 (L) 06/26/2020   CL 102 06/26/2020   CREATININE 2.62 (H) 06/26/2020   BUN 47 (H) 06/26/2020   CO2 23 06/26/2020   TSH 3.57 06/26/2020   HGBA1C 7.8 (H) 06/26/2020   MICROALBUR 36.9 06/26/2020     Lab Results  Component Value Date   HGBA1C 7.5 (H) 12/23/2019       Assessment & Plan:

## 2020-06-27 ENCOUNTER — Encounter: Payer: Self-pay | Admitting: Internal Medicine

## 2020-06-27 LAB — URINALYSIS, ROUTINE W REFLEX MICROSCOPIC
Bacteria, UA: NONE SEEN /HPF
Bilirubin Urine: NEGATIVE
Glucose, UA: NEGATIVE
Hgb urine dipstick: NEGATIVE
Hyaline Cast: NONE SEEN /LPF
Ketones, ur: NEGATIVE
Leukocytes,Ua: NEGATIVE
Nitrite: NEGATIVE
RBC / HPF: NONE SEEN /HPF (ref 0–2)
Specific Gravity, Urine: 1.006 (ref 1.001–1.03)
Squamous Epithelial / HPF: NONE SEEN /HPF (ref <=5)
WBC, UA: NONE SEEN /HPF (ref 0–5)
pH: 6.5 (ref 5.0–8.0)

## 2020-06-27 LAB — COMPLETE METABOLIC PANEL WITH GFR
AG Ratio: 1.2 (calc) (ref 1.0–2.5)
ALT: 14 U/L (ref 6–29)
AST: 13 U/L (ref 10–35)
Albumin: 4.1 g/dL (ref 3.6–5.1)
Alkaline phosphatase (APISO): 122 U/L (ref 37–153)
BUN/Creatinine Ratio: 18 (calc) (ref 6–22)
BUN: 47 mg/dL — ABNORMAL HIGH (ref 7–25)
CO2: 23 mmol/L (ref 20–32)
Calcium: 9.9 mg/dL (ref 8.6–10.4)
Chloride: 102 mmol/L (ref 98–110)
Creat: 2.62 mg/dL — ABNORMAL HIGH (ref 0.60–0.93)
GFR, Est African American: 20 mL/min/{1.73_m2} — ABNORMAL LOW (ref >=60)
GFR, Est Non African American: 17 mL/min/{1.73_m2} — ABNORMAL LOW (ref >=60)
Globulin: 3.3 g/dL (calc) (ref 1.9–3.7)
Glucose, Bld: 87 mg/dL (ref 65–99)
Potassium: 3.4 mmol/L — ABNORMAL LOW (ref 3.5–5.3)
Sodium: 143 mmol/L (ref 135–146)
Total Bilirubin: 0.5 mg/dL (ref 0.2–1.2)
Total Protein: 7.4 g/dL (ref 6.1–8.1)

## 2020-06-27 LAB — CBC WITH DIFFERENTIAL/PLATELET
Absolute Monocytes: 695 cells/uL (ref 200–950)
Basophils Absolute: 85 cells/uL (ref 0–200)
Basophils Relative: 0.7 %
Eosinophils Absolute: 451 cells/uL (ref 15–500)
Eosinophils Relative: 3.7 %
HCT: 39.2 % (ref 35.0–45.0)
Hemoglobin: 12.8 g/dL (ref 11.7–15.5)
Lymphs Abs: 2452 cells/uL (ref 850–3900)
MCH: 28.3 pg (ref 27.0–33.0)
MCHC: 32.7 g/dL (ref 32.0–36.0)
MCV: 86.7 fL (ref 80.0–100.0)
MPV: 11.4 fL (ref 7.5–12.5)
Monocytes Relative: 5.7 %
Neutro Abs: 8516 cells/uL — ABNORMAL HIGH (ref 1500–7800)
Neutrophils Relative %: 69.8 %
Platelets: 407 10*3/uL — ABNORMAL HIGH (ref 140–400)
RBC: 4.52 10*6/uL (ref 3.80–5.10)
RDW: 14 % (ref 11.0–15.0)
Total Lymphocyte: 20.1 %
WBC: 12.2 10*3/uL — ABNORMAL HIGH (ref 3.8–10.8)

## 2020-06-27 LAB — HEMOGLOBIN A1C
Hgb A1c MFr Bld: 7.8 % of total Hgb — ABNORMAL HIGH (ref <5.7)
Mean Plasma Glucose: 177 (calc)
eAG (mmol/L): 9.8 (calc)

## 2020-06-27 LAB — MICROALBUMIN / CREATININE URINE RATIO
Creatinine, Urine: 28 mg/dL (ref 20–275)
Microalb Creat Ratio: 1318 mcg/mg creat — ABNORMAL HIGH (ref <30)
Microalb, Ur: 36.9 mg/dL

## 2020-06-27 LAB — LIPID PANEL
Cholesterol: 175 mg/dL (ref <200)
HDL: 40 mg/dL — ABNORMAL LOW (ref >=50)
LDL Cholesterol (Calc): 103 mg/dL (calc) — ABNORMAL HIGH
Non-HDL Cholesterol (Calc): 135 mg/dL (calc) — ABNORMAL HIGH (ref <130)
Total CHOL/HDL Ratio: 4.4 (calc) (ref <5.0)
Triglycerides: 207 mg/dL — ABNORMAL HIGH (ref <150)

## 2020-06-27 LAB — TSH: TSH: 3.57 mIU/L (ref 0.40–4.50)

## 2020-06-27 NOTE — Assessment & Plan Note (Signed)
stable overall by history and exam, recent data reviewed with pt, and pt to continue medical treatment as before,  to f/u any worsening symptoms or concerns  

## 2020-06-27 NOTE — Assessment & Plan Note (Signed)

## 2020-06-30 ENCOUNTER — Telehealth: Payer: Self-pay | Admitting: Internal Medicine

## 2020-06-30 NOTE — Telephone Encounter (Signed)
[name] would like for you to call her

## 2020-07-02 MED ORDER — TRAMADOL HCL 50 MG PO TABS: 50.0000 mg | ORAL_TABLET | Freq: Four times a day (QID) | ORAL | 2 refills | Status: AC | PRN

## 2020-07-02 NOTE — Telephone Encounter (Signed)
Done erx 

## 2020-07-09 ENCOUNTER — Emergency Department (HOSPITAL_COMMUNITY): Payer: Medicare Other

## 2020-07-09 ENCOUNTER — Inpatient Hospital Stay (HOSPITAL_COMMUNITY)
Admission: EM | Admit: 2020-07-09 | Discharge: 2020-07-21 | DRG: 871 | Disposition: A | Discharge status: Skilled Nursing Facility | Payer: Medicare Other | Attending: Internal Medicine | Admitting: Internal Medicine

## 2020-07-09 ENCOUNTER — Encounter (HOSPITAL_COMMUNITY): Payer: Self-pay | Admitting: Emergency Medicine

## 2020-07-09 ENCOUNTER — Telehealth: Payer: Self-pay | Admitting: Internal Medicine

## 2020-07-09 DIAGNOSIS — I2510 Atherosclerotic heart disease of native coronary artery without angina pectoris: Secondary | ICD-10-CM | POA: Diagnosis not present

## 2020-07-09 DIAGNOSIS — R6520 Severe sepsis without septic shock: Secondary | ICD-10-CM | POA: Diagnosis not present

## 2020-07-09 DIAGNOSIS — R7881 Bacteremia: Secondary | ICD-10-CM | POA: Diagnosis present

## 2020-07-09 DIAGNOSIS — E785 Hyperlipidemia, unspecified: Secondary | ICD-10-CM | POA: Diagnosis present

## 2020-07-09 DIAGNOSIS — I878 Other specified disorders of veins: Secondary | ICD-10-CM | POA: Diagnosis present

## 2020-07-09 DIAGNOSIS — E1022 Type 1 diabetes mellitus with diabetic chronic kidney disease: Secondary | ICD-10-CM | POA: Diagnosis present

## 2020-07-09 DIAGNOSIS — N179 Acute kidney failure, unspecified: Secondary | ICD-10-CM

## 2020-07-09 DIAGNOSIS — A409 Streptococcal sepsis, unspecified: Principal | ICD-10-CM | POA: Diagnosis present

## 2020-07-09 DIAGNOSIS — R0602 Shortness of breath: Secondary | ICD-10-CM | POA: Diagnosis not present

## 2020-07-09 DIAGNOSIS — E1165 Type 2 diabetes mellitus with hyperglycemia: Secondary | ICD-10-CM | POA: Diagnosis not present

## 2020-07-09 DIAGNOSIS — I739 Peripheral vascular disease, unspecified: Secondary | ICD-10-CM | POA: Diagnosis not present

## 2020-07-09 DIAGNOSIS — A419 Sepsis, unspecified organism: Secondary | ICD-10-CM

## 2020-07-09 DIAGNOSIS — Z7401 Bed confinement status: Secondary | ICD-10-CM | POA: Diagnosis not present

## 2020-07-09 DIAGNOSIS — M810 Age-related osteoporosis without current pathological fracture: Secondary | ICD-10-CM | POA: Diagnosis present

## 2020-07-09 DIAGNOSIS — L97909 Non-pressure chronic ulcer of unspecified part of unspecified lower leg with unspecified severity: Secondary | ICD-10-CM | POA: Diagnosis present

## 2020-07-09 DIAGNOSIS — G8929 Other chronic pain: Secondary | ICD-10-CM | POA: Diagnosis present

## 2020-07-09 DIAGNOSIS — N185 Chronic kidney disease, stage 5: Secondary | ICD-10-CM | POA: Diagnosis not present

## 2020-07-09 DIAGNOSIS — B955 Unspecified streptococcus as the cause of diseases classified elsewhere: Secondary | ICD-10-CM | POA: Diagnosis not present

## 2020-07-09 DIAGNOSIS — R0689 Other abnormalities of breathing: Secondary | ICD-10-CM | POA: Diagnosis not present

## 2020-07-09 DIAGNOSIS — E1051 Type 1 diabetes mellitus with diabetic peripheral angiopathy without gangrene: Secondary | ICD-10-CM | POA: Diagnosis present

## 2020-07-09 DIAGNOSIS — I120 Hypertensive chronic kidney disease with stage 5 chronic kidney disease or end stage renal disease: Secondary | ICD-10-CM | POA: Diagnosis not present

## 2020-07-09 DIAGNOSIS — I080 Rheumatic disorders of both mitral and aortic valves: Secondary | ICD-10-CM | POA: Diagnosis not present

## 2020-07-09 DIAGNOSIS — Z20822 Contact with and (suspected) exposure to covid-19: Secondary | ICD-10-CM | POA: Diagnosis present

## 2020-07-09 DIAGNOSIS — G4710 Hypersomnia, unspecified: Secondary | ICD-10-CM | POA: Diagnosis present

## 2020-07-09 DIAGNOSIS — D509 Iron deficiency anemia, unspecified: Secondary | ICD-10-CM | POA: Diagnosis present

## 2020-07-09 DIAGNOSIS — L0390 Cellulitis, unspecified: Secondary | ICD-10-CM

## 2020-07-09 DIAGNOSIS — R778 Other specified abnormalities of plasma proteins: Secondary | ICD-10-CM | POA: Diagnosis present

## 2020-07-09 DIAGNOSIS — G894 Chronic pain syndrome: Secondary | ICD-10-CM | POA: Diagnosis not present

## 2020-07-09 DIAGNOSIS — M2550 Pain in unspecified joint: Secondary | ICD-10-CM | POA: Diagnosis not present

## 2020-07-09 DIAGNOSIS — I872 Venous insufficiency (chronic) (peripheral): Secondary | ICD-10-CM | POA: Diagnosis present

## 2020-07-09 DIAGNOSIS — Z6841 Body Mass Index (BMI) 40.0 and over, adult: Secondary | ICD-10-CM | POA: Diagnosis not present

## 2020-07-09 DIAGNOSIS — E118 Type 2 diabetes mellitus with unspecified complications: Secondary | ICD-10-CM | POA: Diagnosis present

## 2020-07-09 DIAGNOSIS — I499 Cardiac arrhythmia, unspecified: Secondary | ICD-10-CM | POA: Diagnosis not present

## 2020-07-09 DIAGNOSIS — I10 Essential (primary) hypertension: Secondary | ICD-10-CM | POA: Diagnosis present

## 2020-07-09 DIAGNOSIS — J9811 Atelectasis: Secondary | ICD-10-CM | POA: Diagnosis present

## 2020-07-09 DIAGNOSIS — Z87891 Personal history of nicotine dependence: Secondary | ICD-10-CM

## 2020-07-09 DIAGNOSIS — E1122 Type 2 diabetes mellitus with diabetic chronic kidney disease: Secondary | ICD-10-CM | POA: Diagnosis not present

## 2020-07-09 DIAGNOSIS — Z8711 Personal history of peptic ulcer disease: Secondary | ICD-10-CM

## 2020-07-09 DIAGNOSIS — E10622 Type 1 diabetes mellitus with other skin ulcer: Secondary | ICD-10-CM | POA: Diagnosis present

## 2020-07-09 DIAGNOSIS — I8393 Asymptomatic varicose veins of bilateral lower extremities: Secondary | ICD-10-CM | POA: Diagnosis not present

## 2020-07-09 DIAGNOSIS — Z8249 Family history of ischemic heart disease and other diseases of the circulatory system: Secondary | ICD-10-CM

## 2020-07-09 DIAGNOSIS — Z833 Family history of diabetes mellitus: Secondary | ICD-10-CM

## 2020-07-09 DIAGNOSIS — R5381 Other malaise: Secondary | ICD-10-CM | POA: Diagnosis not present

## 2020-07-09 DIAGNOSIS — E876 Hypokalemia: Secondary | ICD-10-CM | POA: Diagnosis not present

## 2020-07-09 DIAGNOSIS — Z794 Long term (current) use of insulin: Secondary | ICD-10-CM

## 2020-07-09 DIAGNOSIS — M1990 Unspecified osteoarthritis, unspecified site: Secondary | ICD-10-CM | POA: Diagnosis present

## 2020-07-09 DIAGNOSIS — R7989 Other specified abnormal findings of blood chemistry: Secondary | ICD-10-CM | POA: Diagnosis not present

## 2020-07-09 DIAGNOSIS — N184 Chronic kidney disease, stage 4 (severe): Secondary | ICD-10-CM | POA: Diagnosis not present

## 2020-07-09 DIAGNOSIS — L03116 Cellulitis of left lower limb: Secondary | ICD-10-CM | POA: Diagnosis present

## 2020-07-09 DIAGNOSIS — J9601 Acute respiratory failure with hypoxia: Secondary | ICD-10-CM | POA: Diagnosis not present

## 2020-07-09 DIAGNOSIS — L97929 Non-pressure chronic ulcer of unspecified part of left lower leg with unspecified severity: Secondary | ICD-10-CM | POA: Diagnosis not present

## 2020-07-09 DIAGNOSIS — Z83511 Family history of glaucoma: Secondary | ICD-10-CM

## 2020-07-09 DIAGNOSIS — H409 Unspecified glaucoma: Secondary | ICD-10-CM | POA: Diagnosis present

## 2020-07-09 DIAGNOSIS — Z79899 Other long term (current) drug therapy: Secondary | ICD-10-CM

## 2020-07-09 DIAGNOSIS — K219 Gastro-esophageal reflux disease without esophagitis: Secondary | ICD-10-CM | POA: Diagnosis present

## 2020-07-09 DIAGNOSIS — Z743 Need for continuous supervision: Secondary | ICD-10-CM | POA: Diagnosis not present

## 2020-07-09 DIAGNOSIS — R609 Edema, unspecified: Secondary | ICD-10-CM | POA: Diagnosis not present

## 2020-07-09 DIAGNOSIS — R0600 Dyspnea, unspecified: Secondary | ICD-10-CM | POA: Diagnosis not present

## 2020-07-09 DIAGNOSIS — Z7902 Long term (current) use of antithrombotics/antiplatelets: Secondary | ICD-10-CM

## 2020-07-09 DIAGNOSIS — I340 Nonrheumatic mitral (valve) insufficiency: Secondary | ICD-10-CM | POA: Diagnosis not present

## 2020-07-09 DIAGNOSIS — Z7982 Long term (current) use of aspirin: Secondary | ICD-10-CM

## 2020-07-09 DIAGNOSIS — I517 Cardiomegaly: Secondary | ICD-10-CM | POA: Diagnosis not present

## 2020-07-09 DIAGNOSIS — M6281 Muscle weakness (generalized): Secondary | ICD-10-CM | POA: Diagnosis not present

## 2020-07-09 DIAGNOSIS — J811 Chronic pulmonary edema: Secondary | ICD-10-CM | POA: Diagnosis not present

## 2020-07-09 DIAGNOSIS — E11622 Type 2 diabetes mellitus with other skin ulcer: Secondary | ICD-10-CM | POA: Diagnosis not present

## 2020-07-09 LAB — COMPREHENSIVE METABOLIC PANEL
ALT: 17 U/L (ref 0–44)
AST: 20 U/L (ref 15–41)
Albumin: 2.8 g/dL — ABNORMAL LOW (ref 3.5–5.0)
Alkaline Phosphatase: 93 U/L (ref 38–126)
Anion gap: 13 (ref 5–15)
BUN: 49 mg/dL — ABNORMAL HIGH (ref 8–23)
CO2: 23 mmol/L (ref 22–32)
Calcium: 8.2 mg/dL — ABNORMAL LOW (ref 8.9–10.3)
Chloride: 101 mmol/L (ref 98–111)
Creatinine, Ser: 3.11 mg/dL — ABNORMAL HIGH (ref 0.44–1.00)
GFR calc Af Amer: 16 mL/min — ABNORMAL LOW (ref >60)
GFR calc non Af Amer: 14 mL/min — ABNORMAL LOW (ref >60)
Glucose, Bld: 282 mg/dL — ABNORMAL HIGH (ref 70–99)
Potassium: 3.4 mmol/L — ABNORMAL LOW (ref 3.5–5.1)
Sodium: 137 mmol/L (ref 135–145)
Total Bilirubin: 0.8 mg/dL (ref 0.3–1.2)
Total Protein: 6.2 g/dL — ABNORMAL LOW (ref 6.5–8.1)

## 2020-07-09 LAB — CBC WITH DIFFERENTIAL/PLATELET
Abs Immature Granulocytes: 0.07 10*3/uL (ref 0.00–0.07)
Basophils Absolute: 0.1 10*3/uL (ref 0.0–0.1)
Basophils Relative: 0 %
Eosinophils Absolute: 0.5 10*3/uL (ref 0.0–0.5)
Eosinophils Relative: 3 %
HCT: 33.3 % — ABNORMAL LOW (ref 36.0–46.0)
Hemoglobin: 10.8 g/dL — ABNORMAL LOW (ref 12.0–15.0)
Immature Granulocytes: 0 %
Lymphocytes Relative: 6 %
Lymphs Abs: 1 10*3/uL (ref 0.7–4.0)
MCH: 27.6 pg (ref 26.0–34.0)
MCHC: 32.4 g/dL (ref 30.0–36.0)
MCV: 85.2 fL (ref 80.0–100.0)
Monocytes Absolute: 0.6 10*3/uL (ref 0.1–1.0)
Monocytes Relative: 4 %
Neutro Abs: 14.1 10*3/uL — ABNORMAL HIGH (ref 1.7–7.7)
Neutrophils Relative %: 87 %
Platelets: 257 10*3/uL (ref 150–400)
RBC: 3.91 MIL/uL (ref 3.87–5.11)
RDW: 14.4 % (ref 11.5–15.5)
WBC: 16.4 10*3/uL — ABNORMAL HIGH (ref 4.0–10.5)
nRBC: 0 % (ref 0.0–0.2)

## 2020-07-09 LAB — TROPONIN I (HIGH SENSITIVITY)
Troponin I (High Sensitivity): 52 ng/L — ABNORMAL HIGH (ref <18)
Troponin I (High Sensitivity): 52 ng/L — ABNORMAL HIGH (ref <18)

## 2020-07-09 LAB — GLUCOSE, CAPILLARY: Glucose-Capillary: 179 mg/dL — ABNORMAL HIGH (ref 70–99)

## 2020-07-09 LAB — APTT: aPTT: 31 seconds (ref 24–36)

## 2020-07-09 LAB — PROTIME-INR
INR: 1.2 (ref 0.8–1.2)
Prothrombin Time: 14.6 seconds (ref 11.4–15.2)

## 2020-07-09 LAB — LACTIC ACID, PLASMA: Lactic Acid, Venous: 2.3 mmol/L (ref 0.5–1.9)

## 2020-07-09 LAB — PREALBUMIN: Prealbumin: 13.2 mg/dL — ABNORMAL LOW (ref 18–38)

## 2020-07-09 LAB — PROCALCITONIN: Procalcitonin: 59.95 ng/mL

## 2020-07-09 LAB — C-REACTIVE PROTEIN: CRP: 25.7 mg/dL — ABNORMAL HIGH (ref <1.0)

## 2020-07-09 MED ORDER — ATORVASTATIN CALCIUM 80 MG PO TABS
80.0000 mg | ORAL_TABLET | Freq: Every evening | ORAL | Status: DC
  Administered 2020-07-09 – 2020-07-20 (×12): 80 mg via ORAL
  Filled 2020-07-09 (×12): qty 1

## 2020-07-09 MED ORDER — ONDANSETRON HCL 4 MG PO TABS: 4.0000 mg | ORAL_TABLET | Freq: Four times a day (QID) | ORAL | Status: DC | PRN

## 2020-07-09 MED ORDER — MIRABEGRON ER 25 MG PO TB24
25.0000 mg | ORAL_TABLET | Freq: Every day | ORAL | Status: DC
  Administered 2020-07-10 – 2020-07-21 (×12): 25 mg via ORAL
  Filled 2020-07-09 (×12): qty 1

## 2020-07-09 MED ORDER — LACTATED RINGERS IV BOLUS (SEPSIS)
1000.0000 mL | Freq: Once | INTRAVENOUS | Status: AC
  Administered 2020-07-09: 1000 mL via INTRAVENOUS

## 2020-07-09 MED ORDER — INSULIN GLARGINE 100 UNIT/ML ~~LOC~~ SOLN
15.0000 [IU] | Freq: Every day | SUBCUTANEOUS | Status: DC
  Administered 2020-07-10 – 2020-07-15 (×6): 15 [IU] via SUBCUTANEOUS
  Filled 2020-07-09 (×6): qty 0.15

## 2020-07-09 MED ORDER — PIPERACILLIN-TAZOBACTAM 3.375 G IVPB 30 MIN
3.3750 g | Freq: Once | INTRAVENOUS | Status: AC
  Administered 2020-07-09: 3.375 g via INTRAVENOUS
  Filled 2020-07-09: qty 50

## 2020-07-09 MED ORDER — GABAPENTIN 100 MG PO CAPS
100.0000 mg | ORAL_CAPSULE | Freq: Three times a day (TID) | ORAL | Status: DC
  Administered 2020-07-10 – 2020-07-21 (×34): 100 mg via ORAL
  Filled 2020-07-09 (×35): qty 1

## 2020-07-09 MED ORDER — ALLOPURINOL 100 MG PO TABS
100.0000 mg | ORAL_TABLET | Freq: Every day | ORAL | Status: DC
  Administered 2020-07-10 – 2020-07-21 (×12): 100 mg via ORAL
  Filled 2020-07-09 (×12): qty 1

## 2020-07-09 MED ORDER — CLOPIDOGREL BISULFATE 75 MG PO TABS
75.0000 mg | ORAL_TABLET | Freq: Every day | ORAL | Status: DC
  Administered 2020-07-10 – 2020-07-21 (×12): 75 mg via ORAL
  Filled 2020-07-09 (×12): qty 1

## 2020-07-09 MED ORDER — ENOXAPARIN SODIUM 30 MG/0.3ML ~~LOC~~ SOLN
30.0000 mg | SUBCUTANEOUS | Status: DC
  Administered 2020-07-10 – 2020-07-20 (×11): 30 mg via SUBCUTANEOUS
  Filled 2020-07-09 (×11): qty 0.3

## 2020-07-09 MED ORDER — LACTATED RINGERS IV SOLN: INTRAVENOUS | Status: DC

## 2020-07-09 MED ORDER — TIMOLOL MALEATE 0.5 % OP SOLN
1.0000 [drp] | Freq: Every day | OPHTHALMIC | Status: DC
  Administered 2020-07-10 – 2020-07-21 (×12): 1 [drp] via OPHTHALMIC
  Filled 2020-07-09: qty 5

## 2020-07-09 MED ORDER — METOPROLOL TARTRATE 50 MG PO TABS
100.0000 mg | ORAL_TABLET | Freq: Two times a day (BID) | ORAL | Status: DC
  Administered 2020-07-09 – 2020-07-21 (×23): 100 mg via ORAL
  Filled 2020-07-09 (×9): qty 2
  Filled 2020-07-09: qty 4
  Filled 2020-07-09 (×14): qty 2

## 2020-07-09 MED ORDER — CALCITRIOL 0.5 MCG PO CAPS
0.5000 ug | ORAL_CAPSULE | Freq: Every day | ORAL | Status: DC
  Administered 2020-07-10 – 2020-07-21 (×12): 0.5 ug via ORAL
  Filled 2020-07-09 (×12): qty 1

## 2020-07-09 MED ORDER — SODIUM CHLORIDE 0.9% FLUSH
3.0000 mL | Freq: Two times a day (BID) | INTRAVENOUS | Status: DC
  Administered 2020-07-10 – 2020-07-21 (×15): 3 mL via INTRAVENOUS

## 2020-07-09 MED ORDER — ONDANSETRON HCL 4 MG/2ML IJ SOLN
4.0000 mg | Freq: Four times a day (QID) | INTRAMUSCULAR | Status: DC | PRN
  Administered 2020-07-10: 4 mg via INTRAVENOUS
  Filled 2020-07-09: qty 2

## 2020-07-09 MED ORDER — TRAMADOL HCL 50 MG PO TABS
50.0000 mg | ORAL_TABLET | Freq: Four times a day (QID) | ORAL | Status: DC | PRN
  Administered 2020-07-10 – 2020-07-13 (×8): 50 mg via ORAL
  Filled 2020-07-09 (×8): qty 1

## 2020-07-09 MED ORDER — ACETAMINOPHEN 325 MG PO TABS
650.0000 mg | ORAL_TABLET | Freq: Four times a day (QID) | ORAL | Status: DC | PRN
  Administered 2020-07-09 – 2020-07-16 (×4): 650 mg via ORAL
  Filled 2020-07-09 (×4): qty 2

## 2020-07-09 MED ORDER — TIZANIDINE HCL 4 MG PO TABS
4.0000 mg | ORAL_TABLET | Freq: Four times a day (QID) | ORAL | Status: DC | PRN
  Administered 2020-07-10 – 2020-07-12 (×3): 4 mg via ORAL
  Filled 2020-07-09 (×3): qty 1

## 2020-07-09 MED ORDER — AMLODIPINE BESYLATE 10 MG PO TABS
10.0000 mg | ORAL_TABLET | Freq: Every day | ORAL | Status: DC
  Administered 2020-07-10 – 2020-07-21 (×12): 10 mg via ORAL
  Filled 2020-07-09 (×12): qty 1

## 2020-07-09 MED ORDER — ZOLPIDEM TARTRATE 5 MG PO TABS
5.0000 mg | ORAL_TABLET | Freq: Every evening | ORAL | Status: DC | PRN
  Administered 2020-07-09: 5 mg via ORAL
  Filled 2020-07-09: qty 1

## 2020-07-09 MED ORDER — VANCOMYCIN HCL 1500 MG/300ML IV SOLN: 1500.0000 mg | INTRAVENOUS | Status: DC

## 2020-07-09 MED ORDER — POLYETHYLENE GLYCOL 3350 17 G PO PACK: 1.0000 | PACK | Freq: Every day | ORAL | Status: DC | PRN

## 2020-07-09 MED ORDER — CEFAZOLIN SODIUM-DEXTROSE 2-4 GM/100ML-% IV SOLN
2.0000 g | Freq: Once | INTRAVENOUS | Status: AC
  Administered 2020-07-09: 2 g via INTRAVENOUS
  Filled 2020-07-09: qty 100

## 2020-07-09 MED ORDER — VANCOMYCIN HCL 2000 MG/400ML IV SOLN
2000.0000 mg | Freq: Once | INTRAVENOUS | Status: AC
  Administered 2020-07-09: 2000 mg via INTRAVENOUS
  Filled 2020-07-09: qty 400

## 2020-07-09 MED ORDER — ENOXAPARIN SODIUM 40 MG/0.4ML ~~LOC~~ SOLN
40.0000 mg | SUBCUTANEOUS | Status: DC
  Administered 2020-07-09: 40 mg via SUBCUTANEOUS
  Filled 2020-07-09: qty 0.4

## 2020-07-09 MED ORDER — SODIUM BICARBONATE 650 MG PO TABS
650.0000 mg | ORAL_TABLET | Freq: Two times a day (BID) | ORAL | Status: DC
  Administered 2020-07-09 – 2020-07-21 (×24): 650 mg via ORAL
  Filled 2020-07-09 (×24): qty 1

## 2020-07-09 MED ORDER — PIPERACILLIN-TAZOBACTAM IN DEX 2-0.25 GM/50ML IV SOLN
2.2500 g | Freq: Four times a day (QID) | INTRAVENOUS | Status: DC
  Administered 2020-07-09: 2.25 g via INTRAVENOUS
  Filled 2020-07-09 (×2): qty 50

## 2020-07-09 MED ORDER — ACETAMINOPHEN 650 MG RE SUPP: 650.0000 mg | Freq: Four times a day (QID) | RECTAL | Status: DC | PRN

## 2020-07-09 MED ORDER — ASPIRIN EC 81 MG PO TBEC
81.0000 mg | DELAYED_RELEASE_TABLET | Freq: Every day | ORAL | Status: DC
  Administered 2020-07-10 – 2020-07-21 (×12): 81 mg via ORAL
  Filled 2020-07-09 (×12): qty 1

## 2020-07-09 MED ORDER — CEFAZOLIN SODIUM-DEXTROSE 1-4 GM/50ML-% IV SOLN: 1.0000 g | Freq: Once | INTRAVENOUS | Status: DC

## 2020-07-09 MED ORDER — PANTOPRAZOLE SODIUM 40 MG PO TBEC
40.0000 mg | DELAYED_RELEASE_TABLET | Freq: Every day | ORAL | Status: DC
  Administered 2020-07-10 – 2020-07-21 (×12): 40 mg via ORAL
  Filled 2020-07-09 (×12): qty 1

## 2020-07-09 NOTE — Telephone Encounter (Signed)
[facility] Report/Call: ---Caller states that her mother vomited twice yesterday. No blood or green bile noted. She found her mother on the floor yesterday and she seemed confused and cold at that time. She doesn't think her mother fell or hit her head. No severe breathing difficulty or blueness around her lips. No fever, cold or cough symptoms. She responded well today, but is sleeping at this time  Advised go to ED now.  Patient is at the ED.

## 2020-07-09 NOTE — H&P (Signed)
History and Physical    [name] POI:[id number] DOB: 03-24-1946 DOA: 07/09/2020  PCP: [name], MD Consultants:  Elisha Ponder - podiatry; Dellia Nims - wound; Burt Knack- cardiology; [place] - urology; Justin Mend - nephrology Patient coming from:  Home - lives with daughter; NOK: Daughter, [name], [phone]  Chief Complaint: SOB  HPI: [name] is a 74 y.o. female with medical history significant of PVD; HTN; HLD; DM; CKD; and CAD presenting with SOB.  She reports that she has been feeling SOB and kind of weak for a couple of days.  She was seen by Dr. [name] on 9/3 and he provided antibiotics but she isn't sure why.  She did vomit once yesterday.  No diarrhea.  She is hurting a little in her joints, "all of them."  No fever.  No urinary symptoms.  No cough.    ED Course:  Cellulitis of LLE with chronic skin breaks; failed outpatient therapy for "infection" of uncertain etiology.  Also with SOB, maybe from fever.  Troponin 50 without CP.    Review of Systems: As per HPI; otherwise review of systems reviewed and negative.   Ambulatory Status:  Ambulates with a cane  COVID Vaccine Status:   Complete  Past Medical History:  Diagnosis Date  . Anemia, iron deficiency   . Aortic atherosclerosis ([place]) 11/17/2017  . Arthritis   . CAD (coronary artery disease)   . Carotid stenosis    Carotid US (02/2014):  Bilateral ICA 40-59%; > 50% L ECA; F/u 1 year  . Cataract    removed both eyes  . Cholelithiasis 11/17/2017  . Chronic kidney disease    chronic renal failure  . DM2 (diabetes mellitus, type 2) ([place])   . GERD (gastroesophageal reflux disease)   . Glaucoma   . HLD (hyperlipidemia)   . HTN (hypertension)   . Hx of cardiovascular stress test    Lexiscan Myoview (03/24/14):  No ischemia, EF 73%, Normal  . Hypoglycemia 06/27/2017  . Mitral regurgitation   . Osteoporosis   . PUD (peptic ulcer disease)   . PVD (peripheral vascular disease) ([place])   . Shortness of breath    with  exertion    Past Surgical History:  Procedure Laterality Date  . ABDOMINAL AORTOGRAM W/LOWER EXTREMITY N/A 08/13/2018   Procedure: ABDOMINAL AORTOGRAM W/LOWER EXTREMITY;  Surgeon: Waynetta Sandy, MD;  Location: [facility];  Service: Cardiovascular;  Laterality: N/A;  . ABDOMINAL HYSTERECTOMY    . APPENDECTOMY    . AV FISTULA PLACEMENT Right 01/18/2013   Procedure: ARTERIOVENOUS (AV) FISTULA CREATION;  Surgeon: Rosetta Posner, MD;  Location: [facility];  Service: Vascular;  Laterality: Right;  Ultrasound guided  . COLONOSCOPY    . CORONARY ANGIOPLASTY WITH STENT PLACEMENT    . PERIPHERAL VASCULAR INTERVENTION Bilateral 08/13/2018   Procedure: PERIPHERAL VASCULAR INTERVENTION;  Surgeon: Waynetta Sandy, MD;  Location: [facility];  Service: Cardiovascular;  Laterality: Bilateral;  . RENAL ANGIOGRAM N/A 09/16/2011   Procedure: RENAL ANGIOGRAM;  Surgeon: Sherren Mocha, MD;  [facility];  Service: Cardiovascular;  Laterality: N/A;    Social History   Socioeconomic History  . Marital status: Single    Spouse name: Not on file  . Number of children: 3  . Years of education: Not on file  . Highest education level: Not on file  Occupational History  . Occupation: homemaker  Tobacco Use  . Smoking status: Former Smoker    Types: Cigarettes  Quit date: 10/25/2007    Years since quitting: 12.7  . Smokeless tobacco: Never Used  Vaping Use  . Vaping Use: Never used  Substance and Sexual Activity  . Alcohol use: No  . Drug use: No  . Sexual activity: Never  Other Topics Concern  . Not on file  Social History Narrative  . Not on file   Social Determinants of Health   Financial Resource Strain: Low Risk   . Difficulty of Paying Living Expenses: Not hard at all  Food Insecurity: No Food Insecurity  . Worried About Charity fundraiser in the Last Year: Never true  . Ran Out of Food in the Last Year: Never true  Transportation Needs: No  Transportation Needs  . Lack of Transportation (Medical): No  . Lack of Transportation (Non-Medical): No  Physical Activity: Inactive  . Days of Exercise per Week: 0 days  . Minutes of Exercise per Session: 0 min  Stress: No Stress Concern Present  . Feeling of Stress : Not at all  Social Connections:   . Frequency of Communication with Friends and Family: Not on file  . Frequency of Social Gatherings with Friends and Family: Not on file  . Attends Religious Services: Not on file  . Active Member of Clubs or Organizations: Not on file  . Attends Archivist Meetings: Not on file  . Marital Status: Not on file  Intimate Partner Violence:   . Fear of Current or Ex-Partner: Not on file  . Emotionally Abused: Not on file  . Physically Abused: Not on file  . Sexually Abused: Not on file    No Known Allergies  Family History  Problem Relation Age of Onset  . Heart disease Father   . Glaucoma Father   . Diabetes Sister   . Diabetes Son   . Colon cancer Neg Hx   . Colon polyps Neg Hx   . Esophageal cancer Neg Hx   . Rectal cancer Neg Hx   . Stomach cancer Neg Hx     Prior to Admission medications   Medication Sig Start Date End Date Taking? Authorizing Provider  acetaminophen (TYLENOL) 500 MG tablet Take 1-2 tablets (500-1,000 mg total) by mouth every 8 (eight) hours as needed for mild pain. 06/29/17   Hongalgi, Lenis Dickinson, MD  allopurinol (ZYLOPRIM) 100 MG tablet Take 100 mg by mouth daily.    [provider]  amLODipine (NORVASC) 10 MG tablet TAKE 1 TABLET BY MOUTH EVERY DAY 06/26/20   [name], MD  aspirin 81 MG tablet Take 81 mg by mouth daily.    [provider]  atorvastatin (LIPITOR) 80 MG tablet TAKE 1 TABLET BY MOUTH DAILY AT 6PM 10/07/19   [name], MD  blood glucose meter kit and supplies KIT Dispense based on patient and insurance preference. Use up to three times daily as directed. (FOR ICD-9 250.00, 250.01). 04/06/16   [name], MD    Blood Glucose Monitoring Suppl [facility] AVIVA) device Use as instructed once daily E11.9 06/26/20 06/26/21  [name], MD  calcitRIOL (ROCALTROL) 0.5 MCG capsule Take by mouth daily. 03/18/19   [provider]  clopidogrel (PLAVIX) 75 MG tablet TAKE 1 TABLET BY MOUTH EVERY DAY 01/20/20   [name], MD  Cyanocobalamin (B-12 PO) Take 1 tablet by mouth daily.    [provider]  diclofenac sodium (VOLTAREN) 1 % GEL Apply 4 g topically 4 (four) times daily as needed.  06/14/18   [name], MD  doxycycline (VIBRA-TABS) 100 MG tablet Take 1 tablet (100 mg total) by mouth 2 (two) times daily. 12/23/19   [name], MD  ferrous sulfate 325 (65 FE) MG tablet TAKE 1 TABLET BY MOUTH TWICE A DAY 06/11/20   [name], MD  furosemide (LASIX) 80 MG tablet Take 0.5 tablets (40 mg total) by mouth 2 (two) times daily. 06/29/17   Hongalgi, Lenis Dickinson, MD  gabapentin (NEURONTIN) 100 MG capsule TAKE 1 CAPSULE BY MOUTH THREE TIMES A DAY 01/07/20   [name], MD  glucose blood (ACCU-CHEK AVIVA PLUS) test strip USE AS INSTRUCTED THREE TIMES DAILY. DX E11.9 06/26/20   [name], MD  hydrOXYzine (ATARAX/VISTARIL) 10 MG tablet Take 1 tablet (10 mg total) by mouth 3 (three) times daily as needed for itching. 07/27/18   Oswald Hillock, MD  Insulin Glargine (LANTUS SOLOSTAR) 100 UNIT/ML Solostar Pen Inject 18 Units into the skin daily. 12/23/19   [name], MD  insulin lispro (HUMALOG KWIKPEN) 100 UNIT/ML KwikPen INEJCT 0-9 UNITS AS DIRECTED INTO SKIN 3 TIMES A DAY 03/24/20   [name], MD  Insulin Pen Needle (BD PEN NEEDLE NANO U/F) 32G X 4 MM MISC USE DAILY WITH LANTUS SOLASTAR  E11.9 06/26/20   [name], MD  Lancets MISC Use as directed three times daily with meals 03/09/17   [name], MD  metoprolol tartrate (LOPRESSOR) 100 MG tablet TAKE 1 TABLET BY MOUTH TWICE A DAY 01/21/20   [name], MD  MYRBETRIQ 25 MG TB24 tablet Take 25 mg by mouth daily. 11/29/19   [provider]   nitroGLYCERIN (NITROSTAT) 0.4 MG SL tablet Place 1 tablet (0.4 mg total) under the tongue every 5 (five) minutes as needed for chest pain. Reported on 04/06/2016 12/04/17   Sherren Mocha, MD  omeprazole (PRILOSEC) 20 MG capsule TAKE 1 CAPSULE BY MOUTH EVERY DAY *APPT DUE IN FEB FOR REFILLS* 06/25/20   [name], MD  polyethylene glycol powder [facility]) powder Take 17 gm by mouth daily 12/13/17   [name], MD  Potassium Chloride ER 20 MEQ TBCR Take 20 mEq by mouth daily.  05/05/18   [provider]  risedronate (ACTONEL) 150 MG tablet TAKE 1 TABLET (150 MG TOTAL) BY MOUTH EVERY 30 (THIRTY) DAYS. FIRST TUESDAY 11/14/19   [name], MD  sodium bicarbonate 650 MG tablet Take 650 mg by mouth 2 (two) times daily. 02/06/19   [provider]  Tetrahydrozoline HCl (VISINE OP) Place 2 drops into both eyes daily as needed (DRY EYE).    [provider]  thiamine (VITAMIN B-1) 50 MG tablet Take 1 tablet (50 mg total) by mouth daily. 11/27/17   Janith Lima, MD  timolol (TIMOPTIC) 0.5 % ophthalmic solution PLACE 1 DROP IN BOTH EYES DAILY 02/03/19   [provider]  tiZANidine (ZANAFLEX) 4 MG tablet TAKE 1 TABLET BY MOUTH EVERY 6 HOURS AS NEEDED FOR MUSCLE SPASMS 06/11/20   [name], MD  traMADol (ULTRAM) 50 MG tablet Take 1 tablet (50 mg total) by mouth every 6 (six) hours as needed. 07/02/20   [name], MD  Vitamin D, Ergocalciferol, (DRISDOL) 1.25 MG (50000 UT) CAPS capsule Take 1 capsule (50,000 Units total) by mouth every 7 (seven) days. 06/20/19   [name], MD  zolpidem (AMBIEN) 5 MG tablet TAKE 1 TABLET BY MOUTH EVERY DAY AT BEDTIME  AS NEEDED FOR SLEEP 04/25/20   [name], MD    Physical Exam: Vitals:   07/09/20 1500 07/09/20 1600 07/09/20 1700 07/09/20 1800  BP: (!) 164/78 (!) 182/76 (!) 197/69 (!) 155/79  Pulse: (!) 105 (!) 101 93 (!) 108  Resp: (!) 28 (!) 32 (!) 30 (!) 30  Temp:      TempSrc:      SpO2: 98% 96% 94% 96%      . General:  Appears ill but is in NAD . Eyes:  PERRL, EOMI, normal lids, iris . ENT:  grossly normal hearing, lips & tongue, mildly dry mm . Neck:  no LAD, masses or thyromegaly . Cardiovascular:  RRR, no m/r/g.  . Respiratory:   CTA bilaterally with no wheezes/rales/rhonchi.  Mildly increased respiratory effort. . Abdomen:  soft, NT, ND, NABS . Skin:  LLE wounds along the left medial lower leg with evidence of chronic stasis but also diffuse erythema streaking up the medial thigh; the skin is taut distal to the wounds but there are 2+ pedal pulses B         . Musculoskeletal:   no bony abnormality . Lower extremity:  Limited foot exam with no ulcerations.  2+ distal pulses.  Marland Kitchen Psychiatric:  Blunted and somnolent mood and affect, speech fluent and appropriate, AOx3 . Neurologic:  CN 2-12 grossly intact, moves all extremities in coordinated fashion    Radiological Exams on Admission: DG Chest Port 1 View  Result Date: 07/09/2020 CLINICAL DATA:  Sepsis EXAM: PORTABLE CHEST 1 VIEW COMPARISON:  January 02, 2019 FINDINGS: The cardiomediastinal silhouette is unchanged and enlarged in contour.Tortuous thoracic aorta. Atherosclerotic calcifications of the aorta. No pleural effusion. No pneumothorax. Scattered LEFT lower lobe atelectasis. iNo acute pleuroparenchymal abnormality. Visualized abdomen is unremarkable. Multilevel degenerative changes of the thoracic spine. IMPRESSION: Scattered LEFT lower lobe atelectasis. No acute cardiopulmonary abnormality. Electronically Signed   By: Valentino Saxon MD   On: 07/09/2020 12:17    EKG: Independently reviewed.  Sinus tachycardia with rate 107; prolonged QTc 522; nonspecific ST changes with no evidence of acute ischemia   Labs on Admission: I have personally reviewed the available labs and imaging studies at the time of the admission.  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 -While awaiting blood cultures, this appears to be a preseptic condition. -Sepsis protocol initiated -Patient do not have initial lactate >4 or SBP <90/MAP <65 and so has not received the 30 cc/kg IVF bolus. -Suspected source is LLE cellulitis -Currently with erythema to most of LLE -Venous-appearing ulcers (see below) are most likely the source -She was given Ancef in the ER but needs Vanc and Zosyn due to severe non-purulent cellulitis by the order set -Admission in  this patient is warranted due to:  -failure of outpatient antibiotic therapy (presumed treatment of this issue by patient report of antibiotics given by PCP) -Blood and urine cultures pending -Will trend lactate to ensure improvement -Will order sepsis protocol procalcitonin level.  Antibiotics would not be indicated for PCT <0.1 and probably should not be used for < 0.25.  >0.5 indicates infection and >>0.5 indicates more serious disease.  As the procalcitonin level normalizes, it will be reasonable to consider de-escalation of antibiotic coverage.  Probable stasis ulcers -Patient with what appears to be stasis dermatitis with chronic ulcerations -She does have taut skin along the ankle L>R  -Patient was  discussed with Dr. Carlis Abbott, who recommends venous reflux study and placement of Unna boots by wound care if positive and outpatient vascular f/u -ABIs ordered in case revascularization may be indicated but she does have symmetric 2+ distal pulses at this time; continue ASA and Plavix -LE wound order set utilized including labs (CRP, ESR, prealbumin, and blood cultures) and consults (TOC team; diabetes coordinator; peripheral vascular navigator; wound care; and nutrition)  Stage 5 CKD -Advanced CKD at baseline but not apparently on HD -Renal diet -Continue Calcitriol  DM -Recent A1c showing good control -Continue Lantus -Cover with moderate-scale SSI  HTN -Continue Norvasc, Lopressor  HLD -Continue Lipitor  Chronic pain -I have reviewed this patient in the [place] Controlled Substances Reporting System.  She is receiving medications from only one provider and is not receiving chronic medications other than Tramadol and Ambien -She is not at particularly high risk of opioid misuse, diversion, or overdose. -Continue Zanaflex, Ultram  Morbid Obesity -BMI 43.8 -Weight loss should be encouraged -Outpatient PCP/bariatric medicine/bariatric surgery f/u encouraged   Note: This patient has been tested and is pending for the novel coronavirus COVID-19.   DVT prophylaxis:  Lovenox Code Status:  Full - confirmed with patient Family Communication: None present  Disposition Plan:  The patient is from: home  Anticipated d/c is to: home without [facility] services   Anticipated d/c date will depend on clinical response to treatment, likely several days  Patient is currently: acutely ill Consults called:  Vascular surgery (telephone only); TOC team; diabetes coordinator; peripheral vascular navigator; wound care; and nutrition Admission status: Admit - It is my clinical opinion that admission to INPATIENT is reasonable and necessary because of the expectation that this patient will require hospital care  that crosses at least 2 midnights to treat this condition based on the medical complexity of the problems presented.  Given the aforementioned information, the predictability of an adverse outcome is felt to be significant.    Karmen Bongo MD [facility]   How to contact the [facility] Attending or Consulting provider [place] or covering provider during after hours [place], for this patient?  1. Check the care team in [facility] and look for a) attending/consulting TRH provider listed and b) the [facility] team listed 2. Log into [email or web address] and use [place]'s universal password to access. If you do not have the password, please contact the hospital operator. 3. Locate the [facility] provider you are looking for under [facility] and page to a number that you can be directly reached. 4. If you still have difficulty reaching the provider, please page the [facility] (Director on Call) for the Hospitalists listed on amion for assistance.   07/09/2020, 6:12 PM

## 2020-07-09 NOTE — Plan of Care (Signed)

## 2020-07-09 NOTE — Progress Notes (Signed)
Pharmacy Antibiotic Note  [name] is a 74 y.o. female admitted on 07/09/2020 with cellulitis.  Pharmacy has been consulted for vancomycin and piperacillin-tazobactam dosing.  WBC 16.4, lactic acid 2.3. Afebrile. Kidney function below baseline with SCr ~3.  Plan: Vancomycin 1,500mg  IV every 48 hours.  Goal trough 10-15 mcg/mL.  Zosyn 2.25g IV every 6 hours Follow up with cultures, antibiotic de-escalation and LOT Monitor renal function and clinical progress    Temp (24hrs), Avg:98.4 F (36.9 C), Min:98.4 F (36.9 C), Max:98.4 F (36.9 C)  Recent Labs  Lab 07/09/20 1239  WBC 16.4*  CREATININE 3.11*  LATICACIDVEN 2.3*    Estimated Creatinine Clearance: 17.2 mL/min (A) (by C-G formula based on SCr of 3.11 mg/dL (H)).    No Known Allergies  Antimicrobials this admission: Vancomycin 9/16 >>  Zosyn 9/16 >>   Dose adjustments this admission: N/a  Microbiology results: 9/16 BCx: pending  Thank you for allowing pharmacy to be a part of this patient's care.  Mercy Riding, PharmD PGY1 Acute Care Pharmacy Resident Please refer to [facility] for unit-specific pharmacist

## 2020-07-09 NOTE — ED Notes (Signed)
Help get patient undress on the monitor did ekg shown to er doctor patient is resting with call bell in reach

## 2020-07-09 NOTE — ED Triage Notes (Signed)
Patient from home with [facility] with complaint of shortness of breath, SpO2 95% on room air, 100% on 2L [place], respiratory rate 32 breaths per minute. Patient alert and oriented at this time. 20g saline lock in left hand. Reports being treated for cellulitis in left lower leg. Left lower leg red and warm to touch.

## 2020-07-09 NOTE — ED Provider Notes (Signed)
[facility] EMERGENCY DEPARTMENT Provider Note   CSN: [id number] Arrival date & time: 07/09/20  1144     History Chief Complaint  Patient presents with  . Shortness of Breath    [name] is a 74 y.o. female.  74 yo F with a chief complaint of shortness of breath.  Going on for about 48 hours.  Having a cough about this amount of time. Has chronic pain in her legs but has worsened.  On antibiotics at home to fight infection.  But she is unsure exactly where the infection was is being treated.  She denies chest pain or pressure.  Denies urinary symptoms.  Denies nausea vomiting or diarrhea.  Denies recent sick contacts.  The history is provided by the patient.  Shortness of Breath Severity:  Moderate Onset quality:  Gradual Duration:  2 days Timing:  Constant Progression:  Worsening Chronicity:  New Relieved by:  Nothing Worsened by:  Nothing Ineffective treatments:  None tried Associated symptoms: cough   Associated symptoms: no abdominal pain, no chest pain, no fever, no headaches, no vomiting and no wheezing        Past Medical History:  Diagnosis Date  . Anemia, iron deficiency   . Aortic atherosclerosis ([place]) 11/17/2017  . Arthritis   . CAD (coronary artery disease)   . Carotid stenosis    Carotid US (02/2014):  Bilateral ICA 40-59%; > 50% L ECA; F/u 1 year  . Cataract    removed both eyes  . Cholelithiasis 11/17/2017  . Chronic kidney disease    chronic renal failure  . DM2 (diabetes mellitus, type 2) ([place])   . GERD (gastroesophageal reflux disease)   . Glaucoma   . HLD (hyperlipidemia)   . HTN (hypertension)   . Hx of cardiovascular stress test    Lexiscan Myoview (03/24/14):  No ischemia, EF 73%, Normal  . Hypoglycemia 06/27/2017  . Mitral regurgitation   . Osteoporosis   . PUD (peptic ulcer disease)   . PVD (peripheral vascular disease) ([place])   . Shortness of breath    with exertion    Patient Active Problem List    Diagnosis Date Noted  . Sepsis due to cellulitis ([place]) 07/09/2020  . Diabetic leg ulcer ([place]) 12/23/2019  . Right shoulder pain 07/08/2019  . Urinary incontinence 07/08/2019  . Gait disorder 08/04/2018  . Cellulitis of both lower extremities 07/26/2018  . Cellulitis 07/26/2018  . Cellulitis of right leg 06/14/2018  . Left wrist pain 06/14/2018  . Constipation 12/13/2017  . Hyperlipidemia LDL goal <130 11/23/2017  . Right upper quadrant abdominal tenderness without rebound tenderness 11/23/2017  . Cholelithiasis 11/17/2017  . Chronic kidney disease (CKD), stage IV (severe) ([place]) 11/17/2017  . Aortic atherosclerosis ([place]) 11/17/2017  . Diabetes mellitus with complication ([place]) 95/63/8756  . 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 Somnolence 06/27/2017  . Chronic pain 06/27/2017  . Bilateral hand numbness 06/08/2017  . Low back pain 03/09/2017  . Insomnia 12/27/2016  . Deficiency anemia 05/19/2016  . End stage renal disease ([place]) 01/15/2013  . Bilateral foot pain 09/07/2012  . Sciatica of left side 03/10/2012  . Native stenosis of renal artery ([place]) 05/11/2011  . Renal artery stenosis ([place]) 04/25/2011  . Preventative health care 04/12/2011  . PERIPHERAL EDEMA 04/01/2010  . B12 deficiency anemia 10/01/2009  . Right cervical radiculopathy 06/17/2008  . ANEMIA-IRON DEFICIENCY 12/13/2007  . PERIPHERAL VASCULAR DISEASE 12/13/2007  . Pure hyperglyceridemia 06/11/2007  .  GLAUCOMA NOS 06/11/2007  . MITRAL REGURGITATION, 2 PLUS 06/11/2007  . Essential hypertension 06/11/2007  . CAD in native artery 06/11/2007  . CAROTID ARTERY STENOSIS 06/11/2007  . GERD 06/11/2007  . Peptic Ulcer, Site NOS 06/11/2007  . OSTEOPOROSIS 06/11/2007    Past Surgical History:  Procedure Laterality Date  . ABDOMINAL AORTOGRAM W/LOWER EXTREMITY N/A 08/13/2018   Procedure: ABDOMINAL AORTOGRAM W/LOWER EXTREMITY;  Surgeon: Waynetta Sandy, MD;  Location: [facility];   Service: Cardiovascular;  Laterality: N/A;  . ABDOMINAL HYSTERECTOMY    . APPENDECTOMY    . AV FISTULA PLACEMENT Right 01/18/2013   Procedure: ARTERIOVENOUS (AV) FISTULA CREATION;  Surgeon: Rosetta Posner, MD;  Location: [facility];  Service: Vascular;  Laterality: Right;  Ultrasound guided  . COLONOSCOPY    . CORONARY ANGIOPLASTY WITH STENT PLACEMENT    . PERIPHERAL VASCULAR INTERVENTION Bilateral 08/13/2018   Procedure: PERIPHERAL VASCULAR INTERVENTION;  Surgeon: Waynetta Sandy, MD;  Location: [facility];  Service: Cardiovascular;  Laterality: Bilateral;  . RENAL ANGIOGRAM N/A 09/16/2011   Procedure: RENAL ANGIOGRAM;  Surgeon: Sherren Mocha, MD;  Location: [facility] CATH LAB;  Service: Cardiovascular;  Laterality: N/A;     OB History   No obstetric history on file.     Family History  Problem Relation Age of Onset  . Heart disease Father   . Glaucoma Father   . Diabetes Sister   . Diabetes Son   . Colon cancer Neg Hx   . Colon polyps Neg Hx   . Esophageal cancer Neg Hx   . Rectal cancer Neg Hx   . Stomach cancer Neg Hx     Social History   Tobacco Use  . Smoking status: Former Smoker    Types: Cigarettes    Quit date: 10/25/2007    Years since quitting: 12.7  . Smokeless tobacco: Never Used  Vaping Use  . Vaping Use: Never used  Substance Use Topics  . Alcohol use: No  . Drug use: No    Home Medications Prior to Admission medications   Medication Sig Start Date End Date Taking? Authorizing Provider  acetaminophen (TYLENOL) 500 MG tablet Take 1-2 tablets (500-1,000 mg total) by mouth every 8 (eight) hours as needed for mild pain. 06/29/17   Hongalgi, Lenis Dickinson, MD  allopurinol (ZYLOPRIM) 100 MG tablet Take 100 mg by mouth daily.    [provider]  amLODipine (NORVASC) 10 MG tablet TAKE 1 TABLET BY MOUTH EVERY DAY 06/26/20   [name], MD  aspirin 81 MG tablet Take 81 mg by mouth daily.    [provider]  atorvastatin (LIPITOR) 80 MG tablet  TAKE 1 TABLET BY MOUTH DAILY AT 6PM 10/07/19   [name], MD  blood glucose meter kit and supplies KIT Dispense based on patient and insurance preference. Use up to three times daily as directed. (FOR ICD-9 250.00, 250.01). 04/06/16   [name], MD  Blood Glucose Monitoring Suppl [facility] AVIVA) device Use as instructed once daily E11.9 06/26/20 06/26/21  [name], MD  calcitRIOL (ROCALTROL) 0.5 MCG capsule Take by mouth daily. 03/18/19   [provider]  clopidogrel (PLAVIX) 75 MG tablet TAKE 1 TABLET BY MOUTH EVERY DAY 01/20/20   [name], MD  Cyanocobalamin (B-12 PO) Take 1 tablet by mouth daily.    [provider]  diclofenac sodium (VOLTAREN) 1 % GEL Apply 4 g topically 4 (four) times daily as needed. 06/14/18  [name], MD  doxycycline (VIBRA-TABS) 100 MG tablet Take 1 tablet (100 mg total) by mouth 2 (two) times daily. 12/23/19   [name], MD  ferrous sulfate 325 (65 FE) MG tablet TAKE 1 TABLET BY MOUTH TWICE A DAY 06/11/20   [name], MD  furosemide (LASIX) 80 MG tablet Take 0.5 tablets (40 mg total) by mouth 2 (two) times daily. 06/29/17   Hongalgi, Lenis Dickinson, MD  gabapentin (NEURONTIN) 100 MG capsule TAKE 1 CAPSULE BY MOUTH THREE TIMES A DAY 01/07/20   [name], MD  glucose blood (ACCU-CHEK AVIVA PLUS) test strip USE AS INSTRUCTED THREE TIMES DAILY. DX E11.9 06/26/20   [name], MD  hydrOXYzine (ATARAX/VISTARIL) 10 MG tablet Take 1 tablet (10 mg total) by mouth 3 (three) times daily as needed for itching. 07/27/18   Oswald Hillock, MD  Insulin Glargine (LANTUS SOLOSTAR) 100 UNIT/ML Solostar Pen Inject 18 Units into the skin daily. 12/23/19   [name], MD  insulin lispro (HUMALOG KWIKPEN) 100 UNIT/ML KwikPen INEJCT 0-9 UNITS AS DIRECTED INTO SKIN 3 TIMES A DAY 03/24/20   [name], MD  Insulin Pen Needle (BD PEN NEEDLE NANO U/F) 32G X 4 MM MISC USE DAILY WITH LANTUS SOLASTAR  E11.9 06/26/20   [name], MD  Lancets MISC Use as directed three  times daily with meals 03/09/17   [name], MD  metoprolol tartrate (LOPRESSOR) 100 MG tablet TAKE 1 TABLET BY MOUTH TWICE A DAY 01/21/20   [name], MD  MYRBETRIQ 25 MG TB24 tablet Take 25 mg by mouth daily. 11/29/19   [provider]  nitroGLYCERIN (NITROSTAT) 0.4 MG SL tablet Place 1 tablet (0.4 mg total) under the tongue every 5 (five) minutes as needed for chest pain. Reported on 04/06/2016 12/04/17   Sherren Mocha, MD  omeprazole (PRILOSEC) 20 MG capsule TAKE 1 CAPSULE BY MOUTH EVERY DAY *APPT DUE IN FEB FOR REFILLS* 06/25/20   [name], MD  polyethylene glycol powder [facility]) powder Take 17 gm by mouth daily 12/13/17   [name], MD  Potassium Chloride ER 20 MEQ TBCR Take 20 mEq by mouth daily.  05/05/18   [provider]  risedronate (ACTONEL) 150 MG tablet TAKE 1 TABLET (150 MG TOTAL) BY MOUTH EVERY 30 (THIRTY) DAYS. FIRST TUESDAY 11/14/19   [name], MD  sodium bicarbonate 650 MG tablet Take 650 mg by mouth 2 (two) times daily. 02/06/19   [provider]  Tetrahydrozoline HCl (VISINE OP) Place 2 drops into both eyes daily as needed (DRY EYE).    [provider]  thiamine (VITAMIN B-1) 50 MG tablet Take 1 tablet (50 mg total) by mouth daily. 11/27/17   Janith Lima, MD  timolol (TIMOPTIC) 0.5 % ophthalmic solution PLACE 1 DROP IN BOTH EYES DAILY 02/03/19   [provider]  tiZANidine (ZANAFLEX) 4 MG tablet TAKE 1 TABLET BY MOUTH EVERY 6 HOURS AS NEEDED FOR MUSCLE SPASMS 06/11/20   [name], MD  traMADol (ULTRAM) 50 MG tablet Take 1 tablet (50 mg total) by mouth every 6 (six) hours as needed. 07/02/20   [name], MD  Vitamin D, Ergocalciferol, (DRISDOL) 1.25 MG (50000 UT) CAPS capsule Take 1 capsule (50,000 Units total) by mouth every 7 (seven) days. 06/20/19   [name], MD  zolpidem (AMBIEN) 5 MG tablet TAKE 1 TABLET BY MOUTH EVERY DAY AT BEDTIME AS NEEDED FOR  SLEEP 04/25/20   [name], MD    Allergies      Patient has no known allergies.  Review of Systems   Review of Systems  Constitutional: Negative for chills and fever.  HENT: Negative for congestion and rhinorrhea.   Eyes: Negative for redness and visual disturbance.  Respiratory: Positive for cough and shortness of breath. Negative for wheezing.   Cardiovascular: Negative for chest pain and palpitations.  Gastrointestinal: Negative for abdominal pain, nausea and vomiting.  Genitourinary: Negative for dysuria and urgency.  Musculoskeletal: Negative for arthralgias and myalgias.  Skin: Negative for pallor and wound.  Neurological: Negative for dizziness and headaches.    Physical Exam Updated Vital Signs BP (!) 155/72   Pulse 98   Temp 98.4 F (36.9 C) (Oral)   Resp (!) 32   SpO2 97%   Physical Exam Vitals and nursing note reviewed.  Constitutional:      General: She is not in acute distress.    Appearance: She is well-developed. She is obese. She is not diaphoretic.  HENT:     Head: Normocephalic and atraumatic.  Eyes:     Pupils: Pupils are equal, round, and reactive to light.  Cardiovascular:     Rate and Rhythm: Normal rate and regular rhythm.     Heart sounds: No murmur heard.  No friction rub. No gallop.   Pulmonary:     Effort: Pulmonary effort is normal. Tachypnea present.     Breath sounds: No wheezing or rales.     Comments: Coarse breath sounds in all fields Abdominal:     General: There is no distension.     Palpations: Abdomen is soft.     Tenderness: There is no abdominal tenderness.  Musculoskeletal:        General: No tenderness.     Cervical back: Normal range of motion and neck supple.     Right lower leg: Edema present.     Left lower leg: Edema present.     Comments: Bilateral lower extremity edema worse left than right.  Left leg is warm compared to the right with increased erythema streaking to the posterior aspect of the leg.  Skin:    General: Skin is warm and dry.  Neurological:      Mental Status: She is alert and oriented to person, place, and time.  Psychiatric:        Behavior: Behavior normal.     ED Results / Procedures / Treatments   Labs (all labs ordered are listed, but only abnormal results are displayed) Labs Reviewed  LACTIC ACID, PLASMA - Abnormal; Notable for the following components:      Result Value   Lactic Acid, Venous 2.3 (*)    All other components within normal limits  COMPREHENSIVE METABOLIC PANEL - Abnormal; Notable for the following components:   Potassium 3.4 (*)    Glucose, Bld 282 (*)    BUN 49 (*)    Creatinine, Ser 3.11 (*)    Calcium 8.2 (*)    Total Protein 6.2 (*)    Albumin 2.8 (*)    GFR calc non Af Amer 14 (*)    GFR calc Af Amer 16 (*)    All other components within normal limits  CBC WITH DIFFERENTIAL/PLATELET - Abnormal; Notable for the following components:   WBC 16.4 (*)    Hemoglobin 10.8 (*)    HCT 33.3 (*)    Neutro Abs 14.1 (*)    All other components  within normal limits  TROPONIN I (HIGH SENSITIVITY) - Abnormal; Notable for the following components:   Troponin I (High Sensitivity) 52 (*)    All other components within normal limits  URINE CULTURE  CULTURE, BLOOD (ROUTINE X 2)  CULTURE, BLOOD (ROUTINE X 2)  SARS CORONAVIRUS 2 BY RT PCR (HOSPITAL ORDER, [facility])  PROTIME-INR  APTT  LACTIC ACID, PLASMA  URINALYSIS, ROUTINE W REFLEX MICROSCOPIC  BRAIN NATRIURETIC PEPTIDE  TROPONIN I (HIGH SENSITIVITY)    EKG EKG Interpretation  Date/Time:  Thursday July 09 2020 11:54:49 EDT Ventricular Rate:  107 PR Interval:    QRS Duration: 84 QT Interval:  391 QTC Calculation: 522 R Axis:   28 Text Interpretation: Sinus tachycardia Probable anteroseptal infarct, old Repol abnrm suggests ischemia, lateral leads Prolonged QT interval diffuse st changes ? rate related Otherwise no significant change Confirmed by Deno Etienne [phone]) on 07/09/2020 11:59:55 AM   Radiology DG Chest  Port 1 View  Result Date: 07/09/2020 CLINICAL DATA:  Sepsis EXAM: PORTABLE CHEST 1 VIEW COMPARISON:  January 02, 2019 FINDINGS: The cardiomediastinal silhouette is unchanged and enlarged in contour.Tortuous thoracic aorta. Atherosclerotic calcifications of the aorta. No pleural effusion. No pneumothorax. Scattered LEFT lower lobe atelectasis. iNo acute pleuroparenchymal abnormality. Visualized abdomen is unremarkable. Multilevel degenerative changes of the thoracic spine. IMPRESSION: Scattered LEFT lower lobe atelectasis. No acute cardiopulmonary abnormality. Electronically Signed   By: Valentino Saxon MD   On: 07/09/2020 12:17    Procedures Procedures (including critical care time)  Medications Ordered in ED Medications  lactated ringers bolus 1,000 mL (0 mLs Intravenous Stopped 07/09/20 1312)  ceFAZolin (ANCEF) IVPB 2g/100 mL premix (0 g Intravenous Stopped 07/09/20 1315)    ED Course  I have reviewed the triage vital signs and the nursing notes.  Pertinent labs & imaging results that were available during my care of the patient were reviewed by me and considered in my medical decision making (see chart for details).    MDM Rules/Calculators/A&P                          74 yo F with a chief complaint of shortness of breath.  Found to be febrile in route with EMS.  She denies any fevers at home.  She is tachypneic, coarse breath sounds bilaterally.  Clinically has cellulitis of the left lower extremity.  Started on Ancef.  Lab work chest x-ray UA reassess.  Mild aki.  Trop mildly elevated. CXR viewed by me without focal infiltrate or ptx.  Discussed with medicine for admission.   CRITICAL CARE Performed by: Cecilio Asper   Total critical care time: 35 minutes  Critical care time was exclusive of separately billable procedures and treating other patients.  Critical care was necessary to treat or prevent imminent or life-threatening deterioration.  Critical care was time  spent personally by me on the following activities: development of treatment plan with patient and/or surrogate as well as nursing, discussions with consultants, evaluation of patient's response to treatment, examination of patient, obtaining history from patient or surrogate, ordering and performing treatments and interventions, ordering and review of laboratory studies, ordering and review of radiographic studies, pulse oximetry and re-evaluation of patient's condition. The patients results and plan were reviewed and discussed.   Any x-rays performed were independently reviewed by myself.   Differential diagnosis were considered with the presenting HPI.  Medications  lactated ringers bolus 1,000 mL (0 mLs Intravenous  Stopped 07/09/20 1312)  ceFAZolin (ANCEF) IVPB 2g/100 mL premix (0 g Intravenous Stopped 07/09/20 1315)    Vitals:   07/09/20 1156 07/09/20 1215  BP: (!) 167/70 (!) 155/72  Pulse: (!) 103 98  Resp: 16 (!) 32  Temp: 98.4 F (36.9 C)   TempSrc: Oral   SpO2: 97% 97%    Final diagnoses:  Cellulitis of left lower extremity  Troponin level elevated  AKI (acute kidney injury) ([place])    Admission/ observation were discussed with the admitting physician, patient and/or family and they are comfortable with the plan.    Final Clinical Impression(s) / ED Diagnoses Final diagnoses:  Cellulitis of left lower extremity  Troponin level elevated  AKI (acute kidney injury) [facility])    Rx / DC Orders ED Discharge Orders    None       Deno Etienne, DO 07/09/20 1505

## 2020-07-10 ENCOUNTER — Other Ambulatory Visit: Payer: Self-pay

## 2020-07-10 ENCOUNTER — Encounter (HOSPITAL_COMMUNITY): Payer: Medicare Other

## 2020-07-10 ENCOUNTER — Inpatient Hospital Stay (HOSPITAL_COMMUNITY): Payer: Medicare Other

## 2020-07-10 ENCOUNTER — Other Ambulatory Visit: Payer: Self-pay | Admitting: *Deleted

## 2020-07-10 DIAGNOSIS — R7881 Bacteremia: Secondary | ICD-10-CM | POA: Diagnosis present

## 2020-07-10 DIAGNOSIS — I340 Nonrheumatic mitral (valve) insufficiency: Secondary | ICD-10-CM

## 2020-07-10 DIAGNOSIS — L97909 Non-pressure chronic ulcer of unspecified part of unspecified lower leg with unspecified severity: Secondary | ICD-10-CM

## 2020-07-10 DIAGNOSIS — B955 Unspecified streptococcus as the cause of diseases classified elsewhere: Secondary | ICD-10-CM

## 2020-07-10 DIAGNOSIS — I83009 Varicose veins of unspecified lower extremity with ulcer of unspecified site: Secondary | ICD-10-CM | POA: Diagnosis present

## 2020-07-10 LAB — BLOOD CULTURE ID PANEL (REFLEXED) - BCID2

## 2020-07-10 LAB — BASIC METABOLIC PANEL
Anion gap: 7 (ref 5–15)
BUN: 46 mg/dL — ABNORMAL HIGH (ref 8–23)
CO2: 22 mmol/L (ref 22–32)
Calcium: 7.8 mg/dL — ABNORMAL LOW (ref 8.9–10.3)
Chloride: 106 mmol/L (ref 98–111)
Creatinine, Ser: 2.82 mg/dL — ABNORMAL HIGH (ref 0.44–1.00)
GFR calc Af Amer: 18 mL/min — ABNORMAL LOW (ref >60)
GFR calc non Af Amer: 16 mL/min — ABNORMAL LOW (ref >60)
Glucose, Bld: 233 mg/dL — ABNORMAL HIGH (ref 70–99)
Potassium: 3.2 mmol/L — ABNORMAL LOW (ref 3.5–5.1)
Sodium: 135 mmol/L (ref 135–145)

## 2020-07-10 LAB — GLUCOSE, CAPILLARY
Glucose-Capillary: 238 mg/dL — ABNORMAL HIGH (ref 70–99)
Glucose-Capillary: 246 mg/dL — ABNORMAL HIGH (ref 70–99)
Glucose-Capillary: 54 mg/dL — ABNORMAL LOW (ref 70–99)
Glucose-Capillary: 111 mg/dL — ABNORMAL HIGH (ref 70–99)
Glucose-Capillary: 142 mg/dL — ABNORMAL HIGH (ref 70–99)

## 2020-07-10 LAB — CBC
HCT: 31.7 % — ABNORMAL LOW (ref 36.0–46.0)
Hemoglobin: 10.4 g/dL — ABNORMAL LOW (ref 12.0–15.0)
MCH: 27.8 pg (ref 26.0–34.0)
MCHC: 32.8 g/dL (ref 30.0–36.0)
MCV: 84.8 fL (ref 80.0–100.0)
Platelets: 228 10*3/uL (ref 150–400)
RBC: 3.74 MIL/uL — ABNORMAL LOW (ref 3.87–5.11)
RDW: 14.4 % (ref 11.5–15.5)
WBC: 15 10*3/uL — ABNORMAL HIGH (ref 4.0–10.5)
nRBC: 0 % (ref 0.0–0.2)

## 2020-07-10 LAB — ECHOCARDIOGRAM COMPLETE
Area-P 1/2: 3.03 cm2
S' Lateral: 3.1 cm

## 2020-07-10 MED ORDER — INSULIN ASPART 100 UNIT/ML ~~LOC~~ SOLN
0.0000 [IU] | Freq: Every day | SUBCUTANEOUS | Status: DC
  Administered 2020-07-11: 2 [IU] via SUBCUTANEOUS

## 2020-07-10 MED ORDER — INFLUENZA VAC A&B SA ADJ QUAD 0.5 ML IM PRSY
0.5000 mL | PREFILLED_SYRINGE | INTRAMUSCULAR | Status: DC
  Filled 2020-07-10: qty 0.5

## 2020-07-10 MED ORDER — INSULIN ASPART 100 UNIT/ML ~~LOC~~ SOLN
0.0000 [IU] | Freq: Three times a day (TID) | SUBCUTANEOUS | Status: DC
  Administered 2020-07-10 – 2020-07-11 (×3): 5 [IU] via SUBCUTANEOUS
  Administered 2020-07-11 (×2): 3 [IU] via SUBCUTANEOUS
  Administered 2020-07-12: 2 [IU] via SUBCUTANEOUS
  Administered 2020-07-12: 5 [IU] via SUBCUTANEOUS
  Administered 2020-07-12: 2 [IU] via SUBCUTANEOUS
  Administered 2020-07-13: 5 [IU] via SUBCUTANEOUS
  Administered 2020-07-13 – 2020-07-14 (×2): 3 [IU] via SUBCUTANEOUS
  Administered 2020-07-14: 2 [IU] via SUBCUTANEOUS
  Administered 2020-07-14: 3 [IU] via SUBCUTANEOUS
  Administered 2020-07-15: 11 [IU] via SUBCUTANEOUS
  Administered 2020-07-15: 3 [IU] via SUBCUTANEOUS

## 2020-07-10 MED ORDER — PROSOURCE PLUS PO LIQD
30.0000 mL | Freq: Two times a day (BID) | ORAL | Status: DC
  Administered 2020-07-11 – 2020-07-21 (×20): 30 mL via ORAL
  Filled 2020-07-10 (×20): qty 30

## 2020-07-10 MED ORDER — LEVALBUTEROL HCL 0.63 MG/3ML IN NEBU
0.6300 mg | INHALATION_SOLUTION | Freq: Three times a day (TID) | RESPIRATORY_TRACT | Status: DC | PRN
  Administered 2020-07-10 – 2020-07-21 (×7): 0.63 mg via RESPIRATORY_TRACT
  Filled 2020-07-10 (×9): qty 3

## 2020-07-10 MED ORDER — SODIUM CHLORIDE 0.9 % IV SOLN
2.0000 g | INTRAVENOUS | Status: DC
  Administered 2020-07-10 – 2020-07-11 (×2): 2 g via INTRAVENOUS
  Filled 2020-07-10 (×2): qty 20

## 2020-07-10 MED ORDER — ADULT MULTIVITAMIN W/MINERALS CH
1.0000 | ORAL_TABLET | Freq: Every day | ORAL | Status: DC
  Administered 2020-07-10 – 2020-07-21 (×12): 1 via ORAL
  Filled 2020-07-10 (×12): qty 1

## 2020-07-10 NOTE — TOC Initial Note (Signed)
Transition of Care [facility]) - Initial/Assessment Note    Patient Details  Name: [name] MRN: [id number] Date of Birth: 11/09/1945  Transition of Care [facility]) CM/SW Contact:    Curlene Labrum, RN Phone Number: 07/10/2020, 11:51 AM  Clinical Narrative:                 Case management spoke with the patient and the daughter on the phone regarding transitions of care.  The patient currently lives with the daughter and grandson at the home, but the daughter states that she is not able to take care of her due to health issues herself.  The daughter states that the patient has frequent falls at home while using a cane for ambulation.  The daughter states that the patient frequently picks at her legs with her fingernails causing sores on her legs.   The daughter would like SNF placement for patient after hospitalization and would like direction to see if the patient would qualify for Medicaid and SNF placement.  Message left with financial counseling to assist family with this issue.  The patient is fully vaccinated through [place] x 2.  Will continue to follow patient for transitions of care needs.  Expected Discharge Plan: Skilled Nursing Facility Barriers to Discharge: Continued Medical Work up   Patient Goals and CMS Choice Patient states their goals for this hospitalization and ongoing recovery are:: Patient's daughter states that she is unabe to take care of her at home due to health issues - requesting SNf placement. CMS Medicare.gov Compare Post Acute Care list provided to:: Patient Represenative (must comment) (daughter, [name]) Choice offered to / list presented to : [place] / Guardian  Expected Discharge Plan and Services Expected Discharge Plan: [place]   Discharge Planning Services: CM Consult   Living arrangements for the past 2 months: Single Family Home                                      Prior Living  Arrangements/Services Living arrangements for the past 2 months: Single Family Home Lives with:: Adult Children, Relatives (Patient lives with daughter, grandson and girlfriend) Patient language and need for interpreter reviewed:: Yes Do you feel safe going back to the place where you live?: Yes      Need for Family Participation in Patient Care: Yes (Comment) Care giver support system in place?: Yes (comment) Current home services: DME Criminal Activity/Legal Involvement Pertinent to Current Situation/Hospitalization: No - Comment as needed  Activities of Daily Living Home Assistive Devices/Equipment: Cane (specify quad or straight) ADL Screening (condition at time of admission) Patient's cognitive ability adequate to safely complete daily activities?: Yes Is the patient deaf or have difficulty hearing?: No Does the patient have difficulty seeing, even when wearing glasses/contacts?: No Does the patient have difficulty concentrating, remembering, or making decisions?: No Patient able to express need for assistance with ADLs?: Yes Does the patient have difficulty dressing or bathing?: No Independently performs ADLs?: Yes (appropriate for developmental age) Does the patient have difficulty walking or climbing stairs?: Yes Weakness of Legs: Left Weakness of Arms/Hands: None  Permission Sought/Granted Permission sought to share information with : Case Manager Permission granted to share information with : Yes, Verbal Permission Granted        Permission granted to share info w Relationship: daughter, [name]     Emotional Assessment Appearance:: Appears  stated age   Affect (typically observed): Accepting   Alcohol / Substance Use: Not Applicable Psych Involvement: No (comment)  Admission diagnosis:  Troponin level elevated [R77.8] Cellulitis of left lower extremity [L03.116] AKI (acute kidney injury) ([place]) [N17.9] Sepsis due to cellulitis (HCC) [L03.90, A41.9] Patient  Active Problem List   Diagnosis Date Noted  . Sepsis due to cellulitis ([place]) 07/09/2020  . Obesity, Class III, BMI 40-49.9 (morbid obesity) ([place]) 07/09/2020  . Diabetic leg ulcer ([place]) 12/23/2019  . Right shoulder pain 07/08/2019  . Urinary incontinence 07/08/2019  . Gait disorder 08/04/2018  . Cellulitis of both lower extremities 07/26/2018  . Cellulitis 07/26/2018  . Cellulitis of right leg 06/14/2018  . Left wrist pain 06/14/2018  . Constipation 12/13/2017  . Hyperlipidemia LDL goal <130 11/23/2017  . Right upper quadrant abdominal tenderness without rebound tenderness 11/23/2017  . Cholelithiasis 11/17/2017  . Aortic atherosclerosis ([place]) 11/17/2017  . Diabetes mellitus with complication ([place]) 02/63/7858  . 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 Somnolence 06/27/2017  . Chronic pain 06/27/2017  . Bilateral hand numbness 06/08/2017  . Low back pain 03/09/2017  . Insomnia 12/27/2016  . Deficiency anemia 05/19/2016  . End stage renal disease ([place]) 01/15/2013  . Bilateral foot pain 09/07/2012  . Sciatica of left side 03/10/2012  . Native stenosis of renal artery ([place]) 05/11/2011  . Renal artery stenosis ([place]) 04/25/2011  . Preventative health care 04/12/2011  . PERIPHERAL EDEMA 04/01/2010  . B12 deficiency anemia 10/01/2009  . Right cervical radiculopathy 06/17/2008  . ANEMIA-IRON DEFICIENCY 12/13/2007  . PERIPHERAL VASCULAR DISEASE 12/13/2007  . Pure hyperglyceridemia 06/11/2007  . GLAUCOMA NOS 06/11/2007  . MITRAL REGURGITATION, 2 PLUS 06/11/2007  . Essential hypertension 06/11/2007  . CAD in native artery 06/11/2007  . CAROTID ARTERY STENOSIS 06/11/2007  . GERD 06/11/2007  . Peptic Ulcer, Site NOS 06/11/2007  . OSTEOPOROSIS 06/11/2007   PCP:  [name], MD Pharmacy:   [id number] - [place], [place] [address] [place] [address] Phone: [phone] Fax:  [phone]     Social Determinants of Health (SDOH) Interventions    Readmission Risk Interventions Readmission Risk Prevention Plan 07/10/2020  Transportation Screening Complete  PCP or Specialist Appt within 3-5 Days Complete  HRI or [place] Complete  Social Work Consult for [place] Planning/Counseling Complete  Palliative Care Screening Complete  Medication Review Press photographer) Complete  Some recent data might be hidden

## 2020-07-10 NOTE — Plan of Care (Signed)
  Problem: Education: Goal: Knowledge of General Education information will improve Description Including pain rating scale, medication(s)/side effects and non-pharmacologic comfort measures Outcome: Progressing   

## 2020-07-10 NOTE — Progress Notes (Signed)
Initial Nutrition Assessment  DOCUMENTATION CODES:   Obesity unspecified  INTERVENTION:   -30 ml Prosource Plus BID, each supplement provides 100 kcals and 15 grams protein -MVI with minerals daily  NUTRITION DIAGNOSIS:   Increased nutrient needs related to wound healing as evidenced by estimated needs.  GOAL:   Patient will meet greater than or equal to 90% of their needs   MONITOR:   PO intake, Supplement acceptance, Labs, Weight trends, Skin, I & O's  REASON FOR ASSESSMENT:   Consult Wound healing  ASSESSMENT:   [name] is a 74 y.o. female with medical history significant of PVD; HTN; HLD; DM; CKD; and CAD presenting with SOB.  She reports that she has been feeling SOB and kind of weak for a couple of days.  She was seen by Dr. [name] on 9/3 and he provided antibiotics but she isn't sure why.  She did vomit once yesterday.  No diarrhea.  She is hurting a little in her joints, "all of them."  No fever.  No urinary symptoms.  No cough.  Pt admitted with sepsis.   Spoke with pt at bedside, who was sleepy at time of visit. She reports good appetite; noted pt consumed 100% of breakfast meal. Documented meal completion 75%. PTA, pt consumed 3 meals per day (Breakfast: oatmeal OR eggs, bacon, and toast; Lunch: sandwich; Dinner: meat, starch, and vegetable).   Pt denies any weight loss. Reviewed wt hx; wt has been stable over the past year.   Discussed importance of good meal and supplement intake to promote healing.   Medications reviewed and include lactated ringers infusion @ 100 ml/hr.   Lab Results  Component Value Date   HGBA1C 7.8 (H) 06/26/2020   PTA DM medications are 18 units inuslin glargine daily and 0-9 units insulin lispro TID with meals .   Labs reviewed: CBGS: 246 (inpatient orders for glycemic control are 0-15 units insulin aspart TID with meals, 0-5 units insulin aspart daily at bedtime, and 15 units insulin glargine daily).   NUTRITION -  FOCUSED PHYSICAL EXAM:    Most Recent Value  Orbital Region No depletion  Upper Arm Region No depletion  Thoracic and Lumbar Region No depletion  Buccal Region No depletion  Temple Region No depletion  Clavicle Bone Region No depletion  Clavicle and Acromion Bone Region No depletion  Scapular Bone Region No depletion  Dorsal Hand No depletion  Patellar Region No depletion  Anterior Thigh Region No depletion  Posterior Calf Region No depletion  Edema (RD Assessment) Mild  Hair Reviewed  Eyes Reviewed  Mouth Reviewed  Skin Reviewed  Nails Reviewed       Diet Order:   Diet Order            Diet renal/carb modified with fluid restriction Diet-HS Snack? Nothing; Room service appropriate? 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  Estimated Nutritional Needs:   Kcal:  1700-1900  Protein:  105-120 grams  Fluid:  1.7-1.9 L    [name], RD, LDN, [place] Registered Dietitian II Certified Diabetes Care and Education Specialist Please refer to [facility] for RD and/or RD on-call/weekend/after hours pager

## 2020-07-10 NOTE — Progress Notes (Signed)
ABI's have been completed. Preliminary results can be found in CV Proc through chart review.   07/10/20 10:14 AM [name] RVT

## 2020-07-10 NOTE — Progress Notes (Signed)
PROGRESS NOTE    [name]  VZC:[id number] DOB: 1946/04/13 DOA: 07/09/2020 PCP: [name], MD    Brief Narrative:  [name] is a 74 year old female with past medical history notable for peripheral vascular disease, hypertension, hyperlipidemia, type 2 diabetes mellitus, CKD, CAD who presented to the ED with shortness of breath.  Patient has been feeling ill, with weakness, myalgias over the past few days.  She was seen by her PCP, Dr. [name] on 06/26/2020 and provided antibiotics.   In the ED, physical exam notable for left lower extremity cellulitis with multiple skin breaks and ulcerations.  WBC elevated 16.4.  Chest x-ray with no acute cardiopulmonary disease findings.  TRH was consulted for admission for failed outpatient antibiotic therapy for cellulitis.   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  Recently started on outpatient antibiotics for lower extremity cellulitis without improvement.  On arrival to the ED, patient was noted to have elevated WBC count of 16.4, lactic acid of 2.3, CRP 27.7 with a procalcitonin of 59.95.  Blood cultures x2+ for gram-positive cocci in chains with BCID notable for Streptococcus species.  TTE with LVEF 50-60%, normal LV function, normal RV systolic function, no evidence of valvular vegetations on TTE. 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  Case was discussed with vascular surgery, Dr. Lurline Hare by admitting physician who recommended venous reflux study and placement of Unna boots by wound care if positive, unfortunately per ultrasound technician, only perform that exam outpatient.  Vascular ultrasound ABI demonstrating moderate arterial disease left lower extremity. --Likely will need close follow-up with vascular surgery after resolution of septicemia outpatient  CKD stage V (baseline Cr 2.6-2.8) --CR 3.11>2.82 --Renal diet --Calcitriol --sodium bicarb 650mg  BID --Avoid nephrotoxins, renally dose all medications --Follow BMP daily  Type 2 diabetes mellitus Hemoglobin A1c 7.8 on 06/26/2020.  --Lantus 15 units obviously daily --Moderate dose insulin sliding scale for further coverage --CBGs qAC/HS  Hyperlipidemia: Continue atorvastatin 80 mg p.o. daily  PVD/CAD --Continue aspirin 81 mg p.o. daily, Plavix 75 mg p.o. daily  Essential hypertension --Continue amlodipine 10mg  p.o. daily, metoprolol 100mg  BID  Chronic pain syndrome --Continue Zanaflex, Ultram  Morbid obesity BMI 43.8.  Discussed with patient need for aggressive lifestyle changes/weight loss as this complicates all facets of care.  Outpatient follow-up with PCP, considerations of bariatric medicine outpatient referral.  DVT prophylaxis: Lovenox Code Status: Full code Family Communication: Updated patient extensively at bedside  Disposition Plan:  Status is: Inpatient  Remains inpatient appropriate because:Ongoing diagnostic testing needed not appropriate for outpatient work up, Unsafe d/c plan, IV treatments appropriate due to intensity of illness or inability to take PO  and Inpatient level of care  appropriate due to severity of illness   Dispo: The patient is from: Home              Anticipated d/c is to: SNF              Anticipated d/c date is: 3 days              Patient currently is not medically stable to d/c.   Consultants:   Infectious disease, Dr. Megan Salon  Vascular surgery, Dr. Carlis Abbott - Case was discussed by admitting physician  Procedures:  TTE IMPRESSIONS  1. Left ventricular ejection fraction, by estimation, is 55 to 60%. The  left ventricle has normal function. The left ventricle has no regional  wall motion abnormalities. Left ventricular diastolic function could not  be evaluated.  2. Right ventricular systolic function is normal. The right ventricular  size is normal. There is normal pulmonary artery systolic pressure. The  estimated right ventricular systolic pressure is 28.3 mmHg.  3. The mitral valve is grossly normal. Mild to moderate mitral valve  regurgitation. No evidence of mitral stenosis.  4. The aortic valve is tricuspid. There is mild calcification of the  aortic valve. There is mild thickening of the aortic valve. Aortic valve  regurgitation is not visualized. Mild aortic valve sclerosis is present,  with no evidence of aortic valve  stenosis.  5. The inferior vena cava is normal in size with greater than 50%  respiratory variability, suggesting right atrial pressure of 3 mmHg.   Vascular ultrasound ABI: Summary:  Right: Resting right ankle-brachial index is within normal range. No  evidence of significant right lower extremity arterial disease. The right  toe-brachial index is abnormal.   Left: Resting left ankle-brachial index indicates moderate left lower  extremity arterial disease. The left toe-brachial index is abnormal.   Antimicrobials:   Ancef 9/16 9 - 9/16  Vancomycin 9/16 - 9/16  Zosyn 9/16 - 9/16  Ceftriaxone 9/17>>   Subjective: Patient seen and examined bedside, currently getting  echocardiogram.  Continues with generalized ill feeling, weakness, fatigue.  Discuss findings of streptococcal bacteremia.  No other complaints or concerns at this time.  Denies headache, no dizziness, no chest pain, palpitations, no abdominal pain, no fever/chills/night sweats, no nausea/vomiting/diarrhea.  No acute events overnight per nursing staff.  Objective: Vitals:   07/09/20 2130 07/09/20 2215 07/10/20 0424 07/10/20 0758  BP: (!) 151/70 (!) 142/69 132/63 (!) 173/71  Pulse: (!) 105 93 82 78  Resp: (!) 25 18 17 17   Temp:  99.8 F (37.7 C) 97.7 F (36.5 C) (!) 97.5 F (36.4 C)  TempSrc:  Oral Oral Oral  SpO2: 96% 98% 93% 95%    Intake/Output Summary (Last 24 hours) at 07/10/2020 1515 Last data filed at 07/10/2020 0900 Gross per 24 hour  Intake 1452.92 ml  Output 700 ml  Net 752.92 ml   There were no vitals filed for this visit.  Examination:  General exam: Appears calm and comfortable, ill in appearance Respiratory system: Clear to auscultation. Respiratory effort normal.  Oxygen well on room air Cardiovascular system: S1 & S2 heard, RRR. No JVD, murmurs, rubs, gallops or clicks. No pedal edema. Gastrointestinal system: Abdomen is nondistended, soft and nontender. No organomegaly or masses felt. Normal bowel sounds heard. Central nervous system: Alert and oriented. No focal neurological deficits. Extremities: Symmetric 5 x 5 power. Skin: Bilateral lower extremities with several areas of skin breaks and ulcerations in various stages of healing, hyperpigmented skin, warm to touch  and dorsalis pedis/posterior tibial pulse 2+ Psychiatry: Judgement and insight appear normal. Mood & affect appropriate.     Data Reviewed: I have personally reviewed following labs and imaging studies  CBC: Recent Labs  Lab 07/09/20 1239 07/10/20 0621  WBC 16.4* 15.0*  NEUTROABS 14.1*  --   HGB 10.8* 10.4*  HCT 33.3* 31.7*  MCV 85.2 84.8  PLT 257 262   Basic Metabolic Panel: Recent Labs   Lab 07/09/20 1239 07/10/20 0621  NA 137 135  K 3.4* 3.2*  CL 101 106  CO2 23 22  GLUCOSE 282* 233*  BUN 49* 46*  CREATININE 3.11* 2.82*  CALCIUM 8.2* 7.8*   GFR: Estimated Creatinine Clearance: 18.9 mL/min (A) (by C-G formula based on SCr of 2.82 mg/dL (H)). Liver Function Tests: Recent Labs  Lab 07/09/20 1239  AST 20  ALT 17  ALKPHOS 93  BILITOT 0.8  PROT 6.2*  ALBUMIN 2.8*   No results for input(s): LIPASE, AMYLASE in the last 168 hours. No results for input(s): AMMONIA in the last 168 hours. Coagulation Profile: Recent Labs  Lab 07/09/20 1239  INR 1.2   Cardiac Enzymes: No results for input(s): CKTOTAL, CKMB, CKMBINDEX, TROPONINI in the last 168 hours. BNP (last 3 results) No results for input(s): PROBNP in the last 8760 hours. HbA1C: No results for input(s): HGBA1C in the last 72 hours. CBG: Recent Labs  Lab 07/09/20 2218 07/10/20 0641 07/10/20 1143  GLUCAP 179* 238* 246*   Lipid Profile: No results for input(s): CHOL, HDL, LDLCALC, TRIG, CHOLHDL, LDLDIRECT in the last 72 hours. Thyroid Function Tests: No results for input(s): TSH, T4TOTAL, FREET4, T3FREE, THYROIDAB in the last 72 hours. Anemia Panel: No results for input(s): VITAMINB12, FOLATE, FERRITIN, TIBC, IRON, RETICCTPCT in the last 72 hours. Sepsis Labs: Recent Labs  Lab 07/09/20 1239 07/09/20 2005  PROCALCITON  --  59.95  LATICACIDVEN 2.3*  --     Recent Results (from the past 240 hour(s))  Blood Culture (routine x 2)     Status: None (Preliminary result)   Collection Time: 07/09/20 12:40 PM   Specimen: BLOOD RIGHT HAND  Result Value Ref Range Status   Specimen Description BLOOD RIGHT HAND  Final   Special Requests   Final    BOTTLES DRAWN AEROBIC AND ANAEROBIC Blood Culture results may not be optimal due to an inadequate volume of blood received in culture bottles   Culture  Setup Time   Final    AEROBIC BOTTLE ONLY GRAM POSITIVE COCCI IN CHAINS CRITICAL VALUE NOTED.  VALUE IS  CONSISTENT WITH PREVIOUSLY REPORTED AND CALLED VALUE.    Culture   Final    NO GROWTH < 24 HOURS Performed at [facility], [place] [address], [place], [place] [address]    Report Status PENDING  Incomplete  Blood Culture (routine x 2)     Status: None (Preliminary result)   Collection Time: 07/09/20 12:45 PM   Specimen: BLOOD LEFT HAND  Result Value Ref Range Status   Specimen Description BLOOD LEFT HAND  Final   Special Requests   Final    BOTTLES DRAWN AEROBIC AND ANAEROBIC Blood Culture adequate volume   Culture  Setup Time   Final    IN BOTH AEROBIC AND ANAEROBIC BOTTLES GRAM POSITIVE COCCI IN CHAINS Organism ID to follow CRITICAL RESULT CALLED TO, READ BACK BY AND VERIFIED WITH: V BRYK 07/10/20 0244 JDW    Culture   Final    NO GROWTH < 24 HOURS Performed  at [facility], [place] [address] [place], [place] [address]    Report Status PENDING  Incomplete  Blood Culture ID Panel (Reflexed)     Status: Abnormal   Collection Time: 07/09/20 12:45 PM  Result Value Ref Range Status   Enterococcus faecalis NOT DETECTED NOT DETECTED Final   Enterococcus Faecium NOT DETECTED NOT DETECTED Final   Listeria monocytogenes NOT DETECTED NOT DETECTED Final   Staphylococcus species NOT DETECTED NOT DETECTED Final   Staphylococcus aureus (BCID) NOT DETECTED NOT DETECTED Final   Staphylococcus epidermidis NOT DETECTED NOT DETECTED Final   Staphylococcus lugdunensis NOT DETECTED NOT DETECTED Final   Streptococcus species DETECTED (A) NOT DETECTED Final    Comment: Not Enterococcus species, Streptococcus agalactiae, Streptococcus pyogenes, or Streptococcus pneumoniae. CRITICAL RESULT CALLED TO, READ BACK BY AND VERIFIED WITH: V BRYK PHARMD 07/10/20 0244 JDW    Streptococcus agalactiae NOT DETECTED NOT DETECTED Final   Streptococcus pneumoniae NOT DETECTED NOT DETECTED Final   Streptococcus pyogenes NOT DETECTED NOT DETECTED Final   A.calcoaceticus-baumannii NOT DETECTED NOT DETECTED  Final   Bacteroides fragilis NOT DETECTED NOT DETECTED Final   Enterobacterales NOT DETECTED NOT DETECTED Final   Enterobacter cloacae complex NOT DETECTED NOT DETECTED Final   Escherichia coli NOT DETECTED NOT DETECTED Final   Klebsiella aerogenes NOT DETECTED NOT DETECTED Final   Klebsiella oxytoca NOT DETECTED NOT DETECTED Final   Klebsiella pneumoniae NOT DETECTED NOT DETECTED Final   Proteus species NOT DETECTED NOT DETECTED Final   Salmonella species NOT DETECTED NOT DETECTED Final   Serratia marcescens NOT DETECTED NOT DETECTED Final   Haemophilus influenzae NOT DETECTED NOT DETECTED Final   Neisseria meningitidis NOT DETECTED NOT DETECTED Final   Pseudomonas aeruginosa NOT DETECTED NOT DETECTED Final   Stenotrophomonas maltophilia NOT DETECTED NOT DETECTED Final   Candida albicans NOT DETECTED NOT DETECTED Final   Candida auris NOT DETECTED NOT DETECTED Final   Candida glabrata NOT DETECTED NOT DETECTED Final   Candida krusei NOT DETECTED NOT DETECTED Final   Candida parapsilosis NOT DETECTED NOT DETECTED Final   Candida tropicalis NOT DETECTED NOT DETECTED Final   Cryptococcus neoformans/gattii NOT DETECTED NOT DETECTED Final    Comment: Performed at [facility], [address] [place], [place] [address]         Radiology Studies: DG Chest Port 1 View  Result Date: 07/09/2020 CLINICAL DATA:  Sepsis EXAM: PORTABLE CHEST 1 VIEW COMPARISON:  January 02, 2019 FINDINGS: The cardiomediastinal silhouette is unchanged and enlarged in contour.Tortuous thoracic aorta. Atherosclerotic calcifications of the aorta. No pleural effusion. No pneumothorax. Scattered LEFT lower lobe atelectasis. iNo acute pleuroparenchymal abnormality. Visualized abdomen is unremarkable. Multilevel degenerative changes of the thoracic spine. IMPRESSION: Scattered LEFT lower lobe atelectasis. No acute cardiopulmonary abnormality. Electronically Signed   By: Valentino Saxon MD   On: 07/09/2020 12:17    VAS [place] ABI WITH/WO TBI  Result Date: 07/10/2020 LOWER EXTREMITY DOPPLER STUDY Indications: Ulceration. High Risk Factors: Hypertension, hyperlipidemia, Diabetes.  Limitations: Today's exam was limited due to patient intolerant to cuff              pressure, an open wound, bandages and involuntary patient movement. Comparison Study: 12/30/2019 - R [place] L 0.76 Performing Technologist: Carlos Levering RVT  Examination Guidelines: A complete evaluation includes at minimum, Doppler waveform signals and systolic blood pressure reading at the level of bilateral brachial, anterior tibial, and posterior tibial arteries, when vessel segments are accessible. Bilateral testing is considered an  integral part of a complete examination. Photoelectric Plethysmograph (PPG) waveforms and toe systolic pressure readings are included as required and additional duplex testing as needed. Limited examinations for reoccurring indications may be performed as noted.  ABI Findings: +---------+------------------+-----+---------+--------+ Right    Rt Pressure (mmHg)IndexWaveform Comment  +---------+------------------+-----+---------+--------+ Brachial 216                    triphasic         +---------+------------------+-----+---------+--------+ PTA      205               0.95 biphasic          +---------+------------------+-----+---------+--------+ DP       124               0.57 biphasic          +---------+------------------+-----+---------+--------+ Great Toe84                0.39                   +---------+------------------+-----+---------+--------+ +---------+------------------+-----+----------+-------+ Left     Lt Pressure (mmHg)IndexWaveform  Comment +---------+------------------+-----+----------+-------+ Brachial 201                    triphasic         +---------+------------------+-----+----------+-------+ PTA      105               0.49 monophasic         +---------+------------------+-----+----------+-------+ DP       110               0.51 monophasic        +---------+------------------+-----+----------+-------+ Great Toe45                0.21                   +---------+------------------+-----+----------+-------+ +-------+-----------+-----------+------------+------------+ ABI/TBIToday's ABIToday's TBIPrevious ABIPrevious TBI +-------+-----------+-----------+------------+------------+ Right  0.95       0.39       [place]          0.55         +-------+-----------+-----------+------------+------------+ Left   0.51       0.21       0.76        0.52         +-------+-----------+-----------+------------+------------+ Values are likely inacurate due to patient movement as a result of pain tolerance. The left ankle cuff was places just distal to the knee due to mid/disal calf wound and bandages.  Summary: Right: Resting right ankle-brachial index is within normal range. No evidence of significant right lower extremity arterial disease. The right toe-brachial index is abnormal. Left: Resting left ankle-brachial index indicates moderate left lower extremity arterial disease. The left toe-brachial index is abnormal.  *See table(s) above for measurements and observations.  Electronically signed by Servando Snare MD on 07/10/2020 at 12:06:11 PM.    Final    ECHOCARDIOGRAM COMPLETE  Result Date: 07/10/2020    ECHOCARDIOGRAM REPORT   Patient Name:   [name] Date of Exam: 07/10/2020 Medical Rec #:  [id number]          Height:       61.0 in Accession #:    [id number]         Weight:       231.0 lb Date of Birth:  12-17-1945           BSA:  2.008 m Patient Age:    70 years           BP:           173/71 mmHg Patient Gender: F                  HR:           78 bpm. Exam Location:  Inpatient Procedure: 2D Echo, Cardiac Doppler and Color Doppler Indications:    Bacteremia  History:        Patient has prior history of Echocardiogram  examinations, most                 recent 08/14/2017. CAD, PVD, Signs/Symptoms:Shortness of Breath                 and Bacteremia; Risk Factors:Hypertension, Diabetes and                 Dyslipidemia.  Sonographer:    Dustin Flock Referring Phys: [id number] Tandrea Kommer J [place]  Sonographer Comments: Patient is morbidly obese. Image acquisition challenging due to patient body habitus. IMPRESSIONS  1. Left ventricular ejection fraction, by estimation, is 55 to 60%. The left ventricle has normal function. The left ventricle has no regional wall motion abnormalities. Left ventricular diastolic function could not be evaluated.  2. Right ventricular systolic function is normal. The right ventricular size is normal. There is normal pulmonary artery systolic pressure. The estimated right ventricular systolic pressure is 82.9 mmHg.  3. The mitral valve is grossly normal. Mild to moderate mitral valve regurgitation. No evidence of mitral stenosis.  4. The aortic valve is tricuspid. There is mild calcification of the aortic valve. There is mild thickening of the aortic valve. Aortic valve regurgitation is not visualized. Mild aortic valve sclerosis is present, with no evidence of aortic valve stenosis.  5. The inferior vena cava is normal in size with greater than 50% respiratory variability, suggesting right atrial pressure of 3 mmHg. Conclusion(s)/Recommendation(s): No evidence of valvular vegetations on this transthoracic echocardiogram. Would recommend a transesophageal echocardiogram to exclude infective endocarditis if clinically indicated. FINDINGS  Left Ventricle: Left ventricular ejection fraction, by estimation, is 55 to 60%. The left ventricle has normal function. The left ventricle has no regional wall motion abnormalities. The left ventricular internal cavity size was normal in size. There is  no left ventricular hypertrophy. Left ventricular diastolic function could not be evaluated due to nondiagnostic images. Left  ventricular diastolic function could not be evaluated. Right Ventricle: The right ventricular size is normal. No increase in right ventricular wall thickness. Right ventricular systolic function is normal. There is normal pulmonary artery systolic pressure. The tricuspid regurgitant velocity is 2.48 m/s, and  with an assumed right atrial pressure of 3 mmHg, the estimated right ventricular systolic pressure is 93.7 mmHg. Left Atrium: Left atrial size was normal in size. Right Atrium: Right atrial size was normal in size. Pericardium: Trivial pericardial effusion is present. Presence of pericardial fat pad. Mitral Valve: The mitral valve is grossly normal. Mild to moderate mitral valve regurgitation. No evidence of mitral valve stenosis. Tricuspid Valve: The tricuspid valve is grossly normal. Tricuspid valve regurgitation is trivial. No evidence of tricuspid stenosis. Aortic Valve: The aortic valve is tricuspid. There is mild calcification of the aortic valve. There is mild thickening of the aortic valve. Aortic valve regurgitation is not visualized. Mild aortic valve sclerosis is present, with no evidence of aortic valve stenosis. Pulmonic Valve: The pulmonic valve was grossly  normal. Pulmonic valve regurgitation is trivial. No evidence of pulmonic stenosis. Aorta: The aortic root and ascending aorta are structurally normal, with no evidence of dilitation. Venous: The inferior vena cava is normal in size with greater than 50% respiratory variability, suggesting right atrial pressure of 3 mmHg. IAS/Shunts: The atrial septum is grossly normal.  LEFT VENTRICLE PLAX 2D LVIDd:         4.40 cm  Diastology LVIDs:         3.10 cm  LV e' medial:    6.64 cm/s LV PW:         1.00 cm  LV E/e' medial:  21.2 LV IVS:        1.00 cm  LV e' lateral:   7.40 cm/s LVOT diam:     2.20 cm  LV E/e' lateral: 19.1 LV SV:         90 LV SV Index:   45 LVOT Area:     3.80 cm  RIGHT VENTRICLE             IVC RV Basal diam:  3.40 cm     IVC  diam: 1.80 cm RV S prime:     12.30 cm/s TAPSE (M-mode): 2.0 cm LEFT ATRIUM             Index       RIGHT ATRIUM           Index LA diam:        4.40 cm 2.19 cm/m  RA Area:     15.90 cm LA Vol (A2C):   32.2 ml 16.04 ml/m RA Volume:   39.80 ml  19.82 ml/m LA Vol (A4C):   41.6 ml 20.72 ml/m LA Biplane Vol: 37.8 ml 18.82 ml/m  AORTIC VALVE LVOT Vmax:   92.00 cm/s LVOT Vmean:  68.900 cm/s LVOT VTI:    0.237 m  AORTA Ao Root diam: 2.80 cm Ao Asc diam:  2.80 cm MITRAL VALVE                TRICUSPID VALVE MV Area (PHT): 3.03 cm     TR Peak grad:   24.6 mmHg MV Decel Time: 250 msec     TR Vmax:        248.00 cm/s MV E velocity: 141.00 cm/s MV A velocity: 135.00 cm/s  SHUNTS MV E/A ratio:  1.04         Systemic VTI:  0.24 m                             Systemic Diam: 2.20 cm Eleonore Chiquito MD Electronically signed by Eleonore Chiquito MD Signature Date/Time: 07/10/2020/11:10:30 AM    Final         Scheduled Meds: . allopurinol  100 mg Oral Daily  . amLODipine  10 mg Oral Daily  . aspirin EC  81 mg Oral Daily  . atorvastatin  80 mg Oral QPM  . calcitRIOL  0.5 mcg Oral Daily  . clopidogrel  75 mg Oral Daily  . enoxaparin (LOVENOX) injection  30 mg Subcutaneous Q24H  . gabapentin  100 mg Oral TID  . [START ON 07/11/2020] influenza vaccine adjuvanted  0.5 mL Intramuscular Tomorrow-1000  . insulin aspart  0-15 Units Subcutaneous TID WC  . insulin aspart  0-5 Units Subcutaneous QHS  . insulin glargine  15 Units Subcutaneous Daily  . metoprolol tartrate  100 mg Oral BID  . mirabegron ER  25 mg Oral Daily  . pantoprazole  40 mg Oral Daily  . sodium bicarbonate  650 mg Oral BID  . sodium chloride flush  3 mL Intravenous Q12H  . timolol  1 drop Both Eyes Daily   Continuous Infusions: . cefTRIAXone (ROCEPHIN)  IV    . lactated ringers 100 mL/hr at 07/10/20 1509     LOS: 1 day    Time spent: 37 minutes spent on chart review, discussion with nursing staff, consultants, updating family and  interview/physical exam; more than 50% of that time was spent in counseling and/or coordination of care.    Nadezhda Pollitt J [place] DO [facility] Available via Epic secure chat 7am-7pm After these hours, please refer to coverage provider listed on [email or web address] 07/10/2020, 3:15 PM

## 2020-07-10 NOTE — Progress Notes (Signed)
PHARMACY - PHYSICIAN COMMUNICATION CRITICAL VALUE ALERT - BLOOD CULTURE IDENTIFICATION (BCID)  [name] is an 74 y.o. female who presented to [facility] on 07/09/2020 with a chief complaint of SOB.  Assessment:  Started on ABX for cellulitis, now growing strep spp in 3/4 blood cx bottles.  Name of physician (or Provider) Contacted: MDenny via text page  Current antibiotics: vancomycin and Zosyn  Changes to prescribed antibiotics recommended:  Recommendations accepted by provider -- change to Rocephin 2g IV Q24H.  Results for orders placed or performed during the hospital encounter of 07/09/20  Blood Culture ID Panel (Reflexed) (Collected: 07/09/2020 12:45 PM)  Result Value Ref Range   Enterococcus faecalis NOT DETECTED NOT DETECTED   Enterococcus Faecium NOT DETECTED NOT DETECTED   Listeria monocytogenes NOT DETECTED NOT DETECTED   Staphylococcus species NOT DETECTED NOT DETECTED   Staphylococcus aureus (BCID) NOT DETECTED NOT DETECTED   Staphylococcus epidermidis NOT DETECTED NOT DETECTED   Staphylococcus lugdunensis NOT DETECTED NOT DETECTED   Streptococcus species DETECTED (A) NOT DETECTED   Streptococcus agalactiae NOT DETECTED NOT DETECTED   Streptococcus pneumoniae NOT DETECTED NOT DETECTED   Streptococcus pyogenes NOT DETECTED NOT DETECTED   A.calcoaceticus-baumannii NOT DETECTED NOT DETECTED   Bacteroides fragilis NOT DETECTED NOT DETECTED   Enterobacterales NOT DETECTED NOT DETECTED   Enterobacter cloacae complex NOT DETECTED NOT DETECTED   Escherichia coli NOT DETECTED NOT DETECTED   Klebsiella aerogenes NOT DETECTED NOT DETECTED   Klebsiella oxytoca NOT DETECTED NOT DETECTED   Klebsiella pneumoniae NOT DETECTED NOT DETECTED   Proteus species NOT DETECTED NOT DETECTED   Salmonella species NOT DETECTED NOT DETECTED   Serratia marcescens NOT DETECTED NOT DETECTED   Haemophilus influenzae NOT DETECTED NOT DETECTED   Neisseria meningitidis NOT DETECTED NOT  DETECTED   Pseudomonas aeruginosa NOT DETECTED NOT DETECTED   Stenotrophomonas maltophilia NOT DETECTED NOT DETECTED   Candida albicans NOT DETECTED NOT DETECTED   Candida auris NOT DETECTED NOT DETECTED   Candida glabrata NOT DETECTED NOT DETECTED   Candida krusei NOT DETECTED NOT DETECTED   Candida parapsilosis NOT DETECTED NOT DETECTED   Candida tropicalis NOT DETECTED NOT DETECTED   Cryptococcus neoformans/gattii NOT DETECTED NOT DETECTED    Wynona Neat, PharmD, BCPS  07/10/2020  3:04 AM

## 2020-07-10 NOTE — Progress Notes (Signed)
  Echocardiogram 2D Echocardiogram has been performed.  [name] 07/10/2020, 9:14 AM

## 2020-07-10 NOTE — Consult Note (Signed)
   [facility] CM Inpatient Consult   07/10/2020  [name] 1945-11-11 [id number]   Patient was active with [place] Management for chronic disease management services.  Patient has been engaged by a [place].  Our community based plan of care has focused on disease management and community resource support.    Plan: Follow up with Inpatient Transition Of Care [TOC] team member to make aware that [place] Management following.   Of note, [facility] Care Management services does not replace or interfere with any services that are needed or arranged by inpatient [facility] care management team.  For additional questions or referrals please contact:  For questions:  [name], RN BSN [facility] Liaison  [phone] business mobile phone Toll free office [phone]  Fax number: [phone] [email or web address]

## 2020-07-10 NOTE — Patient Outreach (Signed)
[facility]) Care Management  07/10/2020  [name] Jul 20, 1946 [id number]  Patient was admitted to the hospital 07/09/20. Nurse Health Coach will perform case closure and transfer patient to the [place] Team. Nurse has informed [facility] Liaison of patient's admission.   Plan: RN Health Coach will close case and will send PCP closure letter.   Emelia Loron RN, BSN [place] [phone] [name]

## 2020-07-10 NOTE — Consult Note (Signed)
[facility] Nurse Consult Note: Reason for Consult:Patient with chronic, nonhealing ulcerations on the LLE. Sees podiatry for toenail debridement, was been seen by Dr. Dellia Nims in the outpatient [facility] in April 2021 who notes "LLE is with inverted bottle sign" consistent with lymphedema. The patient s also followed by Vascular surgery for PAD.  No recent ABI on record (last I see is in 2012). Relevant history includes PVD, HTN, HLD, DM, CKD and CAD.  He is admitted for a problem other than her LE, but presents with cellulitis of the LLE with chronic skin loss. Wound type: Infectious, chronic wounds in patient with mixed etiology. Pressure Injury POA: N/A Measurement: left medial LE 2.5cm x 2cm x 0.2cm  Wound JHH:IDUPB red, irregular, dry Drainage (amount, consistency, odor)  Periwound: Dry. Flaking, shiny, hairless extremities consistent with PAD. Hemosiderin staining consistent with venous insufficiency. Dressing procedure/placement/frequency: I will implement a conservative POC consisting of cleansing the LE and covering wounds with an antimicrobial nonadherent dressing (xeroform). This will be topped with gauze and secures with a few turns of Kerlix roll gauze and paper tape. Feet are to be placed in Prevalon pressure redistribution heel boots to provide protection and floatation to prevent pressure injuries. A sacral silicone foam prophylactic dressing will be placed per protocol.  Recommend follow up post discharge with her podiatry, wound and vascular physicians as previously scheduled.  [facility] nursing team will not follow, but will remain available to this patient, the nursing and medical teams.  Please re-consult if needed. Thanks, Maudie Flakes, MSN, RN, [place], Ar[facility] Abbott  Pager# [phone]

## 2020-07-10 NOTE — Consult Note (Signed)
[facility] for Infectious Disease    Date of Admission:  07/09/2020           Day 2 ceftriaxone       Reason for Consult: Left leg cellulitis complicated by streptococcal bacteremia    Referring Provider: Dr. Eric [place]  Assessment: She has a simple cellulitis of her left lower leg complicated by streptococcal bacteremia.  I agree with empiric ceftriaxone pending antibiotic susceptibility results.  She should improve fairly rapidly and be able to convert to oral amoxicillin in the next 2 to 3 days.  She should only need about 7 days of total antibiotic therapy.  She has no evidence of endocarditis by exam or TTE.  She will not repeat blood cultures or further neurodiagnostic work-up as long as she continues to improve.  Plan: 1. Continue ceftriaxone pending final antibiotic susceptibilities 2. Please call Dr. 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 allopurinol  100 mg Oral Daily  . amLODipine  10 mg Oral Daily  . aspirin EC  81 mg Oral Daily  . atorvastatin  80 mg Oral QPM  . calcitRIOL  0.5 mcg Oral Daily  . clopidogrel  75 mg Oral Daily  . enoxaparin (LOVENOX) injection  30 mg Subcutaneous Q24H  . gabapentin  100 mg Oral TID  . [START ON 07/11/2020] influenza vaccine adjuvanted  0.5 mL Intramuscular Tomorrow-1000  . insulin aspart  0-15 Units Subcutaneous TID WC  . insulin aspart  0-5 Units Subcutaneous QHS  . insulin glargine  15 Units Subcutaneous Daily  . metoprolol tartrate  100 mg Oral BID  . mirabegron ER  25 mg Oral Daily   . pantoprazole  40 mg Oral Daily  . sodium bicarbonate  650 mg Oral BID  . sodium chloride flush  3 mL Intravenous Q12H  . timolol  1 drop Both Eyes Daily   Continuous Infusions: . cefTRIAXone (ROCEPHIN)  IV    . lactated ringers 100 mL/hr at 07/10/20 0700   PRN Meds:.acetaminophen **OR** acetaminophen, ondansetron **OR** ondansetron (ZOFRAN) IV, polyethylene glycol, tiZANidine, traMADol, zolpidem  HPI: [name] is a 74 y.o. female including chronic venous stasis and left lower leg ulcers.  She began developing progressive weakness, shortness of breath fevers about 4 days ago leading to admission yesterday.  Left leg cellulitis was noted upon admission.  Admission blood cultures are growing a strep species.   Review of Systems: Review of Systems  Constitutional: Positive for fever and malaise/fatigue.  Respiratory: Positive for shortness of breath. Negative for cough.   Cardiovascular: Negative for chest pain.  Gastrointestinal: Negative for abdominal pain, diarrhea, nausea and vomiting.  Genitourinary: Negative for dysuria.  Musculoskeletal: Negative for back pain and joint pain.    Past Medical History:  Diagnosis Date  . Anemia, iron deficiency   . Aortic atherosclerosis ([place]) 11/17/2017  . Arthritis   . CAD (coronary artery disease)   . Carotid stenosis    Carotid US (02/2014):  Bilateral ICA 40-59%; > 50%  L ECA; F/u 1 year  . Cataract    removed both eyes  . Cholelithiasis 11/17/2017  . Chronic kidney disease    chronic renal failure  . DM2 (diabetes mellitus, type 2) ([place])   . GERD (gastroesophageal reflux disease)   . Glaucoma   . HLD (hyperlipidemia)   . HTN (hypertension)   . Hx of cardiovascular stress test    Lexiscan Myoview (03/24/14):  No ischemia, EF 73%, Normal  . Hypoglycemia 06/27/2017  . Mitral regurgitation   . Osteoporosis   . PUD (peptic ulcer disease)   . PVD (peripheral vascular disease) ([place])   . Shortness of breath    with exertion     Social History   Tobacco Use  . Smoking status: Former Smoker    Types: Cigarettes    Quit date: 10/25/2007    Years since quitting: 12.7  . Smokeless tobacco: Never Used  Vaping Use  . Vaping Use: Never used  Substance Use Topics  . Alcohol use: No  . Drug use: No    Family History  Problem Relation Age of Onset  . Heart disease Father   . Glaucoma Father   . Diabetes Sister   . Diabetes Son   . Colon cancer Neg Hx   . Colon polyps Neg Hx   . Esophageal cancer Neg Hx   . Rectal cancer Neg Hx   . Stomach cancer Neg Hx    No Known Allergies  OBJECTIVE: Blood pressure (!) 173/71, pulse 78, temperature (!) 97.5 F (36.4 C), temperature source Oral, resp. rate 17, SpO2 95 %.  Physical Exam Constitutional:      Comments: She is resting quietly in bed with eaten lunch tray in front of her.  She is lethargic but does answer questions appropriately.  Cardiovascular:     Rate and Rhythm: Normal rate and regular rhythm.     Heart sounds: No murmur heard.   Pulmonary:     Effort: Pulmonary effort is normal.     Breath sounds: Normal breath sounds.  Abdominal:     Palpations: Abdomen is soft.     Tenderness: There is no abdominal tenderness.  Musculoskeletal:     Comments: She has chronic venous stasis dermatitis of both lower legs with chronic ulcers in her left leg and surrounding cellulitis.  Skin:    Findings: Erythema present.  Psychiatric:        Mood and Affect: Mood normal.     Lab Results Lab Results  Component Value Date   WBC 15.0 (H) 07/10/2020   HGB 10.4 (L) 07/10/2020   HCT 31.7 (L) 07/10/2020   MCV 84.8 07/10/2020   PLT 228 07/10/2020    Lab Results  Component Value Date   CREATININE 2.82 (H) 07/10/2020   BUN 46 (H) 07/10/2020   NA 135 07/10/2020   K 3.2 (L) 07/10/2020   CL 106 07/10/2020   CO2 22 07/10/2020    Lab Results  Component Value Date   ALT 17 07/09/2020   AST 20 07/09/2020   ALKPHOS 93 07/09/2020   BILITOT 0.8 07/09/2020      Microbiology: Recent Results (from the past 240 hour(s))  Blood Culture (routine x 2)     Status: None (Preliminary result)   Collection Time: 07/09/20 12:40 PM   Specimen: BLOOD RIGHT HAND  Result Value Ref Range Status   Specimen Description BLOOD RIGHT HAND  Final   Special Requests   Final    BOTTLES DRAWN AEROBIC AND  ANAEROBIC Blood Culture results may not be optimal due to an inadequate volume of blood received in culture bottles   Culture  Setup Time   Final    AEROBIC BOTTLE ONLY GRAM POSITIVE COCCI IN CHAINS CRITICAL VALUE NOTED.  VALUE IS CONSISTENT WITH PREVIOUSLY REPORTED AND CALLED VALUE.    Culture   Final    NO GROWTH < 24 HOURS Performed at [facility], [place] [address], [place], [place] [address]    Report Status PENDING  Incomplete  Blood Culture (routine x 2)     Status: None (Preliminary result)   Collection Time: 07/09/20 12:45 PM   Specimen: BLOOD LEFT HAND  Result Value Ref Range Status   Specimen Description BLOOD LEFT HAND  Final   Special Requests   Final    BOTTLES DRAWN AEROBIC AND ANAEROBIC Blood Culture adequate volume   Culture  Setup Time   Final    IN BOTH AEROBIC AND ANAEROBIC BOTTLES GRAM POSITIVE COCCI IN CHAINS Organism ID to follow CRITICAL RESULT CALLED TO, READ BACK BY AND VERIFIED WITH: V BRYK 07/10/20 0244 JDW    Culture   Final    NO GROWTH < 24 HOURS Performed at [facility], [place] [address], [place], [place] [address]    Report Status PENDING  Incomplete  Blood Culture ID Panel (Reflexed)     Status: Abnormal   Collection Time: 07/09/20 12:45 PM  Result Value Ref Range Status   Enterococcus faecalis NOT DETECTED NOT DETECTED Final   Enterococcus Faecium NOT DETECTED NOT DETECTED Final   Listeria monocytogenes NOT DETECTED NOT DETECTED Final   Staphylococcus species NOT DETECTED NOT DETECTED Final   Staphylococcus aureus (BCID) NOT DETECTED NOT DETECTED Final   Staphylococcus epidermidis NOT DETECTED NOT DETECTED  Final   Staphylococcus lugdunensis NOT DETECTED NOT DETECTED Final   Streptococcus species DETECTED (A) NOT DETECTED Final    Comment: Not Enterococcus species, Streptococcus agalactiae, Streptococcus pyogenes, or Streptococcus pneumoniae. CRITICAL RESULT CALLED TO, READ BACK BY AND VERIFIED WITH: V BRYK PHARMD 07/10/20 0244 JDW    Streptococcus agalactiae NOT DETECTED NOT DETECTED Final   Streptococcus pneumoniae NOT DETECTED NOT DETECTED Final   Streptococcus pyogenes NOT DETECTED NOT DETECTED Final   A.calcoaceticus-baumannii NOT DETECTED NOT DETECTED Final   Bacteroides fragilis NOT DETECTED NOT DETECTED Final   Enterobacterales NOT DETECTED NOT DETECTED Final   Enterobacter cloacae complex NOT DETECTED NOT DETECTED Final   Escherichia coli NOT DETECTED NOT DETECTED Final   Klebsiella aerogenes NOT DETECTED NOT DETECTED Final   Klebsiella oxytoca NOT DETECTED NOT DETECTED Final   Klebsiella pneumoniae NOT DETECTED NOT DETECTED Final   Proteus species NOT DETECTED NOT DETECTED Final   Salmonella species NOT DETECTED NOT DETECTED Final   Serratia marcescens NOT DETECTED NOT DETECTED Final   Haemophilus influenzae NOT DETECTED NOT DETECTED Final   Neisseria meningitidis NOT DETECTED NOT DETECTED Final   Pseudomonas aeruginosa NOT DETECTED NOT DETECTED Final   Stenotrophomonas maltophilia NOT DETECTED NOT DETECTED Final   Candida albicans NOT DETECTED NOT DETECTED Final   Candida auris NOT DETECTED NOT DETECTED Final   Candida glabrata NOT DETECTED NOT DETECTED Final   Candida krusei NOT DETECTED NOT DETECTED Final   Candida parapsilosis NOT DETECTED NOT DETECTED Final   Candida tropicalis NOT DETECTED NOT DETECTED Final   Cryptococcus neoformans/gattii NOT DETECTED NOT DETECTED Final    Comment: Performed at [facility], [address] [place],  [address]  Michel Bickers, MD [facility] for Infectious [place] [facility] [phone] pager   [phone] cell 07/10/2020, 1:24 PM

## 2020-07-11 ENCOUNTER — Inpatient Hospital Stay (HOSPITAL_COMMUNITY): Payer: Medicare Other

## 2020-07-11 LAB — BASIC METABOLIC PANEL
Anion gap: 12 (ref 5–15)
BUN: 39 mg/dL — ABNORMAL HIGH (ref 8–23)
CO2: 21 mmol/L — ABNORMAL LOW (ref 22–32)
Calcium: 8 mg/dL — ABNORMAL LOW (ref 8.9–10.3)
Chloride: 105 mmol/L (ref 98–111)
Creatinine, Ser: 2.71 mg/dL — ABNORMAL HIGH (ref 0.44–1.00)
GFR calc Af Amer: 19 mL/min — ABNORMAL LOW (ref >60)
GFR calc non Af Amer: 16 mL/min — ABNORMAL LOW (ref >60)
Glucose, Bld: 171 mg/dL — ABNORMAL HIGH (ref 70–99)
Potassium: 3.5 mmol/L (ref 3.5–5.1)
Sodium: 138 mmol/L (ref 135–145)

## 2020-07-11 LAB — CBC
HCT: 29.6 % — ABNORMAL LOW (ref 36.0–46.0)
Hemoglobin: 9.4 g/dL — ABNORMAL LOW (ref 12.0–15.0)
MCH: 26.9 pg (ref 26.0–34.0)
MCHC: 31.8 g/dL (ref 30.0–36.0)
MCV: 84.6 fL (ref 80.0–100.0)
Platelets: 214 10*3/uL (ref 150–400)
RBC: 3.5 MIL/uL — ABNORMAL LOW (ref 3.87–5.11)
RDW: 14.3 % (ref 11.5–15.5)
WBC: 12.8 10*3/uL — ABNORMAL HIGH (ref 4.0–10.5)
nRBC: 0.2 % (ref 0.0–0.2)

## 2020-07-11 LAB — GLUCOSE, CAPILLARY
Glucose-Capillary: 159 mg/dL — ABNORMAL HIGH (ref 70–99)
Glucose-Capillary: 169 mg/dL — ABNORMAL HIGH (ref 70–99)
Glucose-Capillary: 207 mg/dL — ABNORMAL HIGH (ref 70–99)
Glucose-Capillary: 190 mg/dL — ABNORMAL HIGH (ref 70–99)
Glucose-Capillary: 219 mg/dL — ABNORMAL HIGH (ref 70–99)

## 2020-07-11 LAB — MAGNESIUM: Magnesium: 1.6 mg/dL — ABNORMAL LOW (ref 1.7–2.4)

## 2020-07-11 LAB — PROCALCITONIN: Procalcitonin: 39.61 ng/mL

## 2020-07-11 MED ORDER — FUROSEMIDE 10 MG/ML IJ SOLN
40.0000 mg | Freq: Once | INTRAMUSCULAR | Status: AC
  Administered 2020-07-11: 40 mg via INTRAVENOUS
  Filled 2020-07-11: qty 4

## 2020-07-11 MED ORDER — MAGNESIUM SULFATE 2 GM/50ML IV SOLN
2.0000 g | Freq: Once | INTRAVENOUS | Status: AC
  Administered 2020-07-11: 2 g via INTRAVENOUS
  Filled 2020-07-11: qty 50

## 2020-07-11 MED ORDER — POTASSIUM CHLORIDE CRYS ER 20 MEQ PO TBCR
40.0000 meq | EXTENDED_RELEASE_TABLET | Freq: Once | ORAL | Status: AC
  Administered 2020-07-11: 40 meq via ORAL
  Filled 2020-07-11: qty 2

## 2020-07-11 NOTE — Plan of Care (Signed)

## 2020-07-11 NOTE — Progress Notes (Signed)
PROGRESS NOTE    [name]  OZH:[id number] DOB: 1946-05-15 DOA: 07/09/2020 PCP: [name], MD    Brief Narrative:  [name] is a 74 year old female with past medical history notable for peripheral vascular disease, hypertension, hyperlipidemia, type 2 diabetes mellitus, CKD, CAD who presented to the ED with shortness of breath.  Patient has been feeling ill, with weakness, myalgias over the past few days.  She was seen by her PCP, Dr. [name] on 06/26/2020 and provided antibiotics.   In the ED, physical exam notable for left lower extremity cellulitis with multiple skin breaks and ulcerations.  WBC elevated 16.4.  Chest x-ray with no acute cardiopulmonary disease findings.  TRH was consulted for admission for failed outpatient antibiotic therapy for cellulitis.   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  Recently started on outpatient antibiotics for lower extremity cellulitis without improvement.  On arrival to the ED, patient was noted to have elevated WBC count of 16.4, lactic acid of 2.3, CRP 27.7 with a procalcitonin of 59.95.  Blood cultures x2+ for gram-positive cocci in chains with BCID notable for Streptococcus species.  TTE with LVEF 50-60%, normal LV function, normal RV systolic function, no evidence of valvular vegetations on TTE. --ID following, appreciate assistance --WBC 16.4>15.0>12.8 --PCT 59.95>39.61 --Continue  ceftriaxone 2 g IV every 24 hours; await further culture susceptibilities prior to de-escalation further --Supportive care, antipyretics  Acute hypoxic respiratory failure Overnight, patient requiring up to 6 L nasal cannula of supplemental oxygen due to hypoxia. Review of chest x-ray this morning notes vascular congestion. 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  Case was discussed with vascular surgery, Dr. Lurline Hare by admitting physician who recommended venous reflux study and placement of Unna boots by wound care if positive, unfortunately per ultrasound technician, only perform that exam outpatient.  Vascular ultrasound ABI demonstrating moderate arterial disease left lower extremity. --Likely will need close follow-up with vascular surgery after resolution of septicemia outpatient  CKD stage V (baseline Cr 2.6-2.8) --CR 3.11>2.82>2.71 --Renal diet --Calcitriol --sodium bicarb 650mg  BID --Avoid nephrotoxins, renally dose all medications --Follow BMP daily  Type 2 diabetes mellitus Hemoglobin A1c 7.8 on 06/26/2020.  --Lantus 15 units obviously daily --Moderate dose insulin sliding scale for further coverage --CBGs qAC/HS  Hyperlipidemia: Continue atorvastatin 80 mg p.o. daily  PVD/CAD --Continue aspirin 81 mg p.o. daily, Plavix 75 mg p.o. daily  Essential hypertension --Continue amlodipine 10mg  p.o. daily, metoprolol 100mg  BID  Chronic pain syndrome --Continue Zanaflex, Ultram  Morbid obesity BMI 43.8.  Discussed with patient need for aggressive lifestyle changes/weight loss as this complicates all facets of care.  Outpatient follow-up with PCP, considerations of bariatric medicine outpatient referral.  DVT prophylaxis:  Lovenox Code Status:  Full code Family Communication: Updated patient extensively at bedside, updated patient's daughter [name] via telephone this morning  Disposition Plan:  Status is: Inpatient  Remains inpatient appropriate because:Ongoing diagnostic testing needed not appropriate for outpatient work up, Unsafe d/c plan, IV treatments appropriate due to intensity of illness or inability to take PO and Inpatient level of care appropriate due to severity of illness   Dispo: The patient is from: Home              Anticipated d/c is to: SNF              Anticipated d/c date is: 3 days              Patient currently is not medically stable to d/c.   Consultants:   Infectious disease, Dr. Megan Salon  Vascular surgery, Dr. Carlis Abbott - Case was discussed by admitting physician  Procedures:  TTE IMPRESSIONS  1. Left ventricular ejection fraction, by estimation, is 55 to 60%. The  left ventricle has normal function. The left ventricle has no regional  wall motion abnormalities. Left ventricular diastolic function could not  be evaluated.  2. Right ventricular systolic function is normal. The right ventricular  size is normal. There is normal pulmonary artery systolic pressure. The  estimated right ventricular systolic pressure is 62.9 mmHg.  3. The mitral valve is grossly normal. Mild to moderate mitral valve  regurgitation. No evidence of mitral stenosis.  4. The aortic valve is tricuspid. There is mild calcification of the  aortic valve. There is mild thickening of the aortic valve. Aortic valve  regurgitation is not visualized. Mild aortic valve sclerosis is present,  with no evidence of aortic valve  stenosis.  5. The inferior vena cava is normal in size with greater than 50%  respiratory variability, suggesting right atrial pressure of 3 mmHg.   Vascular ultrasound ABI: Summary:  Right: Resting right ankle-brachial index is within normal range. No  evidence of significant right  lower extremity arterial disease. The right  toe-brachial index is abnormal.   Left: Resting left ankle-brachial index indicates moderate left lower  extremity arterial disease. The left toe-brachial index is abnormal.   Antimicrobials:   Ancef 9/16 9 - 9/16  Vancomycin 9/16 - 9/16  Zosyn 9/16 - 9/16  Ceftriaxone 9/17>>   Subjective: Patient seen and examined bedside, resting comfortably.  Overnight became hypoxic requiring supplemental oxygen placement.  Continues with generalized ill feeling, weakness, fatigue.  Updated patient's daughter [name] via telephone this morning; she states unable to care for her mother at home given that she herself has many chronic medical conditions and that she requires around-the-clock care.  Patient without any other complaints or concerns at this time.  Denies headache, no fever/chills/night sweats, no chest pain, palpitations, no abdominal pain.  No other acute events overnight per nursing staff.  Objective: Vitals:   07/10/20 2230 07/11/20 0313 07/11/20 0804 07/11/20 0837  BP:  119/67 (!) 154/67   Pulse:  72 70 83  Resp:  16 20 18   Temp:  97.8 F (36.6 C) 98.7 F (37.1 C)   TempSrc:  Oral Oral   SpO2: 91% 96% 95% 93%  Weight:        Intake/Output Summary (Last 24 hours) at 07/11/2020 1118 Last data filed at 07/11/2020 0931 Gross per 24 hour  Intake 1200 ml  Output --  Net 1200 ml   Filed Weights   07/10/20 1531  Weight: 104.8 kg    Examination:  General  exam: Appears calm and comfortable, ill in appearance Respiratory system: Clear to auscultation. Respiratory effort normal.  On 6 L nasal cannula with SPO2 96%. Cardiovascular system: S1 & S2 heard, RRR. No JVD, murmurs, rubs, gallops or clicks. No pedal edema. Gastrointestinal system: Abdomen is nondistended, soft and nontender. No organomegaly or masses felt. Normal bowel sounds heard. Central nervous system: Alert and oriented. No focal neurological deficits. Extremities:  Symmetric 5 x 5 power. Skin: Bilateral lower extremities with several areas of skin breaks and ulcerations in various stages of healing, hyperpigmented skin, warm to touch and dorsalis pedis/posterior tibial pulse 2+ Psychiatry: Judgement and insight appear normal. Mood & affect appropriate.     Data Reviewed: I have personally reviewed following labs and imaging studies  CBC: Recent Labs  Lab 07/09/20 1239 07/10/20 0621 07/11/20 0237  WBC 16.4* 15.0* 12.8*  NEUTROABS 14.1*  --   --   HGB 10.8* 10.4* 9.4*  HCT 33.3* 31.7* 29.6*  MCV 85.2 84.8 84.6  PLT 257 228 017   Basic Metabolic Panel: Recent Labs  Lab 07/09/20 1239 07/10/20 0621 07/11/20 0237  NA 137 135 138  K 3.4* 3.2* 3.5  CL 101 106 105  CO2 23 22 21*  GLUCOSE 282* 233* 171*  BUN 49* 46* 39*  CREATININE 3.11* 2.82* 2.71*  CALCIUM 8.2* 7.8* 8.0*  MG  --   --  1.6*   GFR: Estimated Creatinine Clearance: 19.7 mL/min (A) (by C-G formula based on SCr of 2.71 mg/dL (H)). Liver Function Tests: Recent Labs  Lab 07/09/20 1239  AST 20  ALT 17  ALKPHOS 93  BILITOT 0.8  PROT 6.2*  ALBUMIN 2.8*   No results for input(s): LIPASE, AMYLASE in the last 168 hours. No results for input(s): AMMONIA in the last 168 hours. Coagulation Profile: Recent Labs  Lab 07/09/20 1239  INR 1.2   Cardiac Enzymes: No results for input(s): CKTOTAL, CKMB, CKMBINDEX, TROPONINI in the last 168 hours. BNP (last 3 results) No results for input(s): PROBNP in the last 8760 hours. HbA1C: No results for input(s): HGBA1C in the last 72 hours. CBG: Recent Labs  Lab 07/10/20 1705 07/10/20 1824 07/10/20 2044 07/11/20 0640 07/11/20 0759  GLUCAP 54* 111* 142* 159* 169*   Lipid Profile: No results for input(s): CHOL, HDL, LDLCALC, TRIG, CHOLHDL, LDLDIRECT in the last 72 hours. Thyroid Function Tests: No results for input(s): TSH, T4TOTAL, FREET4, T3FREE, THYROIDAB in the last 72 hours. Anemia Panel: No results for input(s):  VITAMINB12, FOLATE, FERRITIN, TIBC, IRON, RETICCTPCT in the last 72 hours. Sepsis Labs: Recent Labs  Lab 07/09/20 1239 07/09/20 2005 07/11/20 0237  PROCALCITON  --  59.95 39.61  LATICACIDVEN 2.3*  --   --     Recent Results (from the past 240 hour(s))  Blood Culture (routine x 2)     Status: None (Preliminary result)   Collection Time: 07/09/20 12:40 PM   Specimen: BLOOD RIGHT HAND  Result Value Ref Range Status   Specimen Description BLOOD RIGHT HAND  Final   Special Requests   Final    BOTTLES DRAWN AEROBIC AND ANAEROBIC Blood Culture results may not be optimal due to an inadequate volume of blood received in culture bottles   Culture  Setup Time   Final    AEROBIC BOTTLE ONLY GRAM POSITIVE COCCI IN CHAINS CRITICAL VALUE NOTED.  VALUE IS CONSISTENT WITH PREVIOUSLY REPORTED AND CALLED VALUE. Performed at [facility], [place] [address] [place], [place] [address]    Culture  GRAM POSITIVE COCCI  Final   Report Status PENDING  Incomplete  Blood Culture (routine x 2)     Status: None (Preliminary result)   Collection Time: 07/09/20 12:45 PM   Specimen: BLOOD LEFT HAND  Result Value Ref Range Status   Specimen Description BLOOD LEFT HAND  Final   Special Requests   Final    BOTTLES DRAWN AEROBIC AND ANAEROBIC Blood Culture adequate volume   Culture  Setup Time   Final    IN BOTH AEROBIC AND ANAEROBIC BOTTLES GRAM POSITIVE COCCI IN CHAINS Organism ID to follow CRITICAL RESULT CALLED TO, READ BACK BY AND VERIFIED WITH: V BRYK 07/10/20 0244 JDW    Culture   Final    NO GROWTH < 24 HOURS Performed at [facility], [place] [address], [place],  [address]    Report Status PENDING  Incomplete  Blood Culture ID Panel (Reflexed)     Status: Abnormal   Collection Time: 07/09/20 12:45 PM  Result Value Ref Range Status   Enterococcus faecalis NOT DETECTED NOT DETECTED Final   Enterococcus Faecium NOT DETECTED NOT DETECTED Final   Listeria monocytogenes NOT DETECTED NOT  DETECTED Final   Staphylococcus species NOT DETECTED NOT DETECTED Final   Staphylococcus aureus (BCID) NOT DETECTED NOT DETECTED Final   Staphylococcus epidermidis NOT DETECTED NOT DETECTED Final   Staphylococcus lugdunensis NOT DETECTED NOT DETECTED Final   Streptococcus species DETECTED (A) NOT DETECTED Final    Comment: Not Enterococcus species, Streptococcus agalactiae, Streptococcus pyogenes, or Streptococcus pneumoniae. CRITICAL RESULT CALLED TO, READ BACK BY AND VERIFIED WITH: V BRYK PHARMD 07/10/20 0244 JDW    Streptococcus agalactiae NOT DETECTED NOT DETECTED Final   Streptococcus pneumoniae NOT DETECTED NOT DETECTED Final   Streptococcus pyogenes NOT DETECTED NOT DETECTED Final   A.calcoaceticus-baumannii NOT DETECTED NOT DETECTED Final   Bacteroides fragilis NOT DETECTED NOT DETECTED Final   Enterobacterales NOT DETECTED NOT DETECTED Final   Enterobacter cloacae complex NOT DETECTED NOT DETECTED Final   Escherichia coli NOT DETECTED NOT DETECTED Final   Klebsiella aerogenes NOT DETECTED NOT DETECTED Final   Klebsiella oxytoca NOT DETECTED NOT DETECTED Final   Klebsiella pneumoniae NOT DETECTED NOT DETECTED Final   Proteus species NOT DETECTED NOT DETECTED Final   Salmonella species NOT DETECTED NOT DETECTED Final   Serratia marcescens NOT DETECTED NOT DETECTED Final   Haemophilus influenzae NOT DETECTED NOT DETECTED Final   Neisseria meningitidis NOT DETECTED NOT DETECTED Final   Pseudomonas aeruginosa NOT DETECTED NOT DETECTED Final   Stenotrophomonas maltophilia NOT DETECTED NOT DETECTED Final   Candida albicans NOT DETECTED NOT DETECTED Final   Candida auris NOT DETECTED NOT DETECTED Final   Candida glabrata NOT DETECTED NOT DETECTED Final   Candida krusei NOT DETECTED NOT DETECTED Final   Candida parapsilosis NOT DETECTED NOT DETECTED Final   Candida tropicalis NOT DETECTED NOT DETECTED Final   Cryptococcus neoformans/gattii NOT DETECTED NOT DETECTED Final    Comment:  Performed at [facility], [address] [place],  [address]         Radiology Studies: DG Chest Port 1 View  Result Date: 07/09/2020 CLINICAL DATA:  Sepsis EXAM: PORTABLE CHEST 1 VIEW COMPARISON:  January 02, 2019 FINDINGS: The cardiomediastinal silhouette is unchanged and enlarged in contour.Tortuous thoracic aorta. Atherosclerotic calcifications of the aorta. No pleural effusion. No pneumothorax. Scattered LEFT lower lobe atelectasis. iNo acute pleuroparenchymal abnormality. Visualized abdomen is unremarkable. Multilevel degenerative changes of the thoracic spine. IMPRESSION: Scattered  LEFT lower lobe atelectasis. No acute cardiopulmonary abnormality. Electronically Signed   By: Valentino Saxon MD   On: 07/09/2020 12:17   VAS [place] ABI WITH/WO TBI  Result Date: 07/10/2020 LOWER EXTREMITY DOPPLER STUDY Indications: Ulceration. High Risk Factors: Hypertension, hyperlipidemia, Diabetes.  Limitations: Today's exam was limited due to patient intolerant to cuff              pressure, an open wound, bandages and involuntary patient movement. Comparison Study: 12/30/2019 - R [place] L 0.76 Performing Technologist: Carlos Levering RVT  Examination Guidelines: A complete evaluation includes at minimum, Doppler waveform signals and systolic blood pressure reading at the level of bilateral brachial, anterior tibial, and posterior tibial arteries, when vessel segments are accessible. Bilateral testing is considered an integral part of a complete examination. Photoelectric Plethysmograph (PPG) waveforms and toe systolic pressure readings are included as required and additional duplex testing as needed. Limited examinations for reoccurring indications may be performed as noted.  ABI Findings: +---------+------------------+-----+---------+--------+ Right    Rt Pressure (mmHg)IndexWaveform Comment  +---------+------------------+-----+---------+--------+ Brachial 216                    triphasic          +---------+------------------+-----+---------+--------+ PTA      205               0.95 biphasic          +---------+------------------+-----+---------+--------+ DP       124               0.57 biphasic          +---------+------------------+-----+---------+--------+ Great Toe84                0.39                   +---------+------------------+-----+---------+--------+ +---------+------------------+-----+----------+-------+ Left     Lt Pressure (mmHg)IndexWaveform  Comment +---------+------------------+-----+----------+-------+ Brachial 201                    triphasic         +---------+------------------+-----+----------+-------+ PTA      105               0.49 monophasic        +---------+------------------+-----+----------+-------+ DP       110               0.51 monophasic        +---------+------------------+-----+----------+-------+ Great Toe45                0.21                   +---------+------------------+-----+----------+-------+ +-------+-----------+-----------+------------+------------+ ABI/TBIToday's ABIToday's TBIPrevious ABIPrevious TBI +-------+-----------+-----------+------------+------------+ Right  0.95       0.39       Long          0.55         +-------+-----------+-----------+------------+------------+ Left   0.51       0.21       0.76        0.52         +-------+-----------+-----------+------------+------------+ Values are likely inacurate due to patient movement as a result of pain tolerance. The left ankle cuff was places just distal to the knee due to mid/disal calf wound and bandages.  Summary: Right: Resting right ankle-brachial index is within normal range. No evidence of significant right lower extremity arterial disease. The right toe-brachial index is abnormal. Left: Resting left ankle-brachial index  indicates moderate left lower extremity arterial disease. The left toe-brachial index is abnormal.  *See table(s)  above for measurements and observations.  Electronically signed by Servando Snare MD on 07/10/2020 at 12:06:11 PM.    Final    ECHOCARDIOGRAM COMPLETE  Result Date: 07/10/2020    ECHOCARDIOGRAM REPORT   Patient Name:   [name] Date of Exam: 07/10/2020 Medical Rec #:  [id number]          Height:       61.0 in Accession #:    [id number]         Weight:       231.0 lb Date of Birth:  1946/04/09           BSA:          2.008 m Patient Age:    59 years           BP:           173/71 mmHg Patient Gender: F                  HR:           78 bpm. Exam Location:  Inpatient Procedure: 2D Echo, Cardiac Doppler and Color Doppler Indications:    Bacteremia  History:        Patient has prior history of Echocardiogram examinations, most                 recent 08/14/2017. CAD, PVD, Signs/Symptoms:Shortness of Breath                 and Bacteremia; Risk Factors:Hypertension, Diabetes and                 Dyslipidemia.  Sonographer:    Dustin Flock Referring Phys: [id number] Margarett [place]  Sonographer Comments: Patient is morbidly obese. Image acquisition challenging due to patient body habitus. IMPRESSIONS  1. Left ventricular ejection fraction, by estimation, is 55 to 60%. The left ventricle has normal function. The left ventricle has no regional wall motion abnormalities. Left ventricular diastolic function could not be evaluated.  2. Right ventricular systolic function is normal. The right ventricular size is normal. There is normal pulmonary artery systolic pressure. The estimated right ventricular systolic pressure is 24.8 mmHg.  3. The mitral valve is grossly normal. Mild to moderate mitral valve regurgitation. No evidence of mitral stenosis.  4. The aortic valve is tricuspid. There is mild calcification of the aortic valve. There is mild thickening of the aortic valve. Aortic valve regurgitation is not visualized. Mild aortic valve sclerosis is present, with no evidence of aortic valve stenosis.  5. The inferior  vena cava is normal in size with greater than 50% respiratory variability, suggesting right atrial pressure of 3 mmHg. Conclusion(s)/Recommendation(s): No evidence of valvular vegetations on this transthoracic echocardiogram. Would recommend a transesophageal echocardiogram to exclude infective endocarditis if clinically indicated. FINDINGS  Left Ventricle: Left ventricular ejection fraction, by estimation, is 55 to 60%. The left ventricle has normal function. The left ventricle has no regional wall motion abnormalities. The left ventricular internal cavity size was normal in size. There is  no left ventricular hypertrophy. Left ventricular diastolic function could not be evaluated due to nondiagnostic images. Left ventricular diastolic function could not be evaluated. Right Ventricle: The right ventricular size is normal. No increase in right ventricular wall thickness. Right ventricular systolic function is normal. There is normal pulmonary artery systolic pressure. The tricuspid regurgitant velocity is 2.48 m/s, and  with an assumed right atrial pressure of 3 mmHg, the estimated right ventricular systolic pressure is 76.5 mmHg. Left Atrium: Left atrial size was normal in size. Right Atrium: Right atrial size was normal in size. Pericardium: Trivial pericardial effusion is present. Presence of pericardial fat pad. Mitral Valve: The mitral valve is grossly normal. Mild to moderate mitral valve regurgitation. No evidence of mitral valve stenosis. Tricuspid Valve: The tricuspid valve is grossly normal. Tricuspid valve regurgitation is trivial. No evidence of tricuspid stenosis. Aortic Valve: The aortic valve is tricuspid. There is mild calcification of the aortic valve. There is mild thickening of the aortic valve. Aortic valve regurgitation is not visualized. Mild aortic valve sclerosis is present, with no evidence of aortic valve stenosis. Pulmonic Valve: The pulmonic valve was grossly normal. Pulmonic valve  regurgitation is trivial. No evidence of pulmonic stenosis. Aorta: The aortic root and ascending aorta are structurally normal, with no evidence of dilitation. Venous: The inferior vena cava is normal in size with greater than 50% respiratory variability, suggesting right atrial pressure of 3 mmHg. IAS/Shunts: The atrial septum is grossly normal.  LEFT VENTRICLE PLAX 2D LVIDd:         4.40 cm  Diastology LVIDs:         3.10 cm  LV e' medial:    6.64 cm/s LV PW:         1.00 cm  LV E/e' medial:  21.2 LV IVS:        1.00 cm  LV e' lateral:   7.40 cm/s LVOT diam:     2.20 cm  LV E/e' lateral: 19.1 LV SV:         90 LV SV Index:   45 LVOT Area:     3.80 cm  RIGHT VENTRICLE             IVC RV Basal diam:  3.40 cm     IVC diam: 1.80 cm RV S prime:     12.30 cm/s TAPSE (M-mode): 2.0 cm LEFT ATRIUM             Index       RIGHT ATRIUM           Index LA diam:        4.40 cm 2.19 cm/m  RA Area:     15.90 cm LA Vol (A2C):   32.2 ml 16.04 ml/m RA Volume:   39.80 ml  19.82 ml/m LA Vol (A4C):   41.6 ml 20.72 ml/m LA Biplane Vol: 37.8 ml 18.82 ml/m  AORTIC VALVE LVOT Vmax:   92.00 cm/s LVOT Vmean:  68.900 cm/s LVOT VTI:    0.237 m  AORTA Ao Root diam: 2.80 cm Ao Asc diam:  2.80 cm MITRAL VALVE                TRICUSPID VALVE MV Area (PHT): 3.03 cm     TR Peak grad:   24.6 mmHg MV Decel Time: 250 msec     TR Vmax:        248.00 cm/s MV E velocity: 141.00 cm/s MV A velocity: 135.00 cm/s  SHUNTS MV E/A ratio:  1.04         Systemic VTI:  0.24 m                             Systemic Diam: 2.20 cm Eleonore Chiquito MD Electronically signed by Eleonore Chiquito MD Signature Date/Time: 07/10/2020/11:10:30 AM  Final         Scheduled Meds: . (feeding supplement) PROSource Plus  30 mL Oral BID BM  . allopurinol  100 mg Oral Daily  . amLODipine  10 mg Oral Daily  . aspirin EC  81 mg Oral Daily  . atorvastatin  80 mg Oral QPM  . calcitRIOL  0.5 mcg Oral Daily  . clopidogrel  75 mg Oral Daily  . enoxaparin (LOVENOX) injection   30 mg Subcutaneous Q24H  . furosemide  40 mg Intravenous Once  . gabapentin  100 mg Oral TID  . influenza vaccine adjuvanted  0.5 mL Intramuscular Tomorrow-1000  . insulin aspart  0-15 Units Subcutaneous TID WC  . insulin aspart  0-5 Units Subcutaneous QHS  . insulin glargine  15 Units Subcutaneous Daily  . metoprolol tartrate  100 mg Oral BID  . mirabegron ER  25 mg Oral Daily  . multivitamin with minerals  1 tablet Oral Daily  . pantoprazole  40 mg Oral Daily  . sodium bicarbonate  650 mg Oral BID  . sodium chloride flush  3 mL Intravenous Q12H  . timolol  1 drop Both Eyes Daily   Continuous Infusions: . cefTRIAXone (ROCEPHIN)  IV 2 g (07/10/20 1839)  . lactated ringers 100 mL/hr at 07/11/20 0606     LOS: 2 days    Time spent: 37 minutes spent on chart review, discussion with nursing staff, consultants, updating family and interview/physical exam; more than 50% of that time was spent in counseling and/or coordination of care.    Rosalinda Seaman J [place] DO [facility] Available via Epic secure chat 7am-7pm After these hours, please refer to coverage provider listed on [email or web address] 07/11/2020, 11:18 AM

## 2020-07-11 NOTE — Progress Notes (Signed)
Called to evaluate-NT had noted pt having DIB- sats on RA low 80's- pt c/o "passing out" dizzy, nausea- along with left flank pain- noted slightly tachy and tachypneic- placed on 2l o2- [place]- turned up to 4l- then to 6l to get sats above 91%- on cal notified and orders received- pt given nebulizer tx- an antiemetic- then pain medication- pt was then able to relax maintain sats 91-93% on 6l- HR returned to baseline SR-70's to 80's

## 2020-07-11 NOTE — Evaluation (Signed)
Physical Therapy Evaluation Patient Details Name: [name] MRN: [id number] DOB: 1946/04/28 Today's Date: 07/11/2020   History of Present Illness  [name] is a 74 year old female with past medical history notable for peripheral vascular disease, hypertension, hyperlipidemia, type 2 diabetes mellitus, CKD, CAD who presented to the ED with shortness of breath.  Found to have sepsis due to left lower extremity cellulitis.  Clinical Impression  Patient presents with decreased mobility due to weakness, SOB, decreased balance and pain L LE.  Today was able to get up recliner with min A with RW but only able to go to chair due to SOB and desat on 6L O2 with mobility to 86% and recovered to 90% in 2 minutes resting.  She is from home which is two levels with bedroom upstairs.  Feel she should hopefully progress if able to improve SOB and be able to go home with daughter assist and follow up HHPT.  PT to continue to follow acutely.     Follow Up Recommendations Home health PT;Supervision/Assistance - 24 hour    Equipment Recommendations  Rolling walker with 5" wheels    Recommendations for Other Services       Precautions / Restrictions Precautions Precautions: Fall      Mobility  Bed Mobility Overal bed mobility: Needs Assistance Bed Mobility: Supine to Sit     Supine to sit: HOB elevated;Min assist     General bed mobility comments: assist to lift trunk upright  Transfers Overall transfer level: Needs assistance Equipment used: Rolling walker (2 wheeled) Transfers: Sit to/from [facility] Sit to Stand: Min assist Stand pivot transfers: Min assist       General transfer comment: assist for balance, lines/safety and for hygiene due to soiled from purewick and BM, able to step to recliner with min A, but limited due to SOB  Ambulation/Gait                Stairs            Wheelchair Mobility    Modified Rankin (Stroke Patients  Only)       Balance Overall balance assessment: Needs assistance Sitting-balance support: Feet supported Sitting balance-Leahy Scale: Fair     Standing balance support: Bilateral upper extremity supported Standing balance-Leahy Scale: Poor Standing balance comment: UE support for balance                             Pertinent Vitals/Pain Pain Assessment: Faces Faces Pain Scale: Hurts even more Pain Location: L LE with movement Pain Descriptors / Indicators: Grimacing;Guarding;Discomfort Pain Intervention(s): Monitored during session;Repositioned;Limited activity within patient's tolerance    Home Living Family/patient expects to be discharged to:: Private residence Living Arrangements: Children Available Help at Discharge: Family Type of Home: House Home Access: Level entry     Home Layout: Two level;Bed/bath upstairs Home Equipment: Cane - single point;Tub bench      Prior Function Level of Independence: Independent with assistive device(s)         Comments: walks with cane, takes care of her room but daugther does grocery shopping and cleaning/cooking     Hand Dominance        Extremity/Trunk Assessment   Upper Extremity Assessment Upper Extremity Assessment: Generalized weakness    Lower Extremity Assessment Lower Extremity Assessment: Generalized weakness;LLE deficits/detail LLE Deficits / Details: painful with movement, strength limited compared to R <3/5       Communication  Communication: No difficulties  Cognition Arousal/Alertness: Awake/alert Behavior During Therapy: WFL for tasks assessed/performed Overall Cognitive Status: Within Functional Limits for tasks assessed                                        General Comments General comments (skin integrity, edema, etc.): on 6L O2 [place]; SpO2 resting 91%, with mobility 86%, back to 90% resting about 2 minutes, but still wheezing; RN aware reports to get diuretic     Exercises     Assessment/Plan    PT Assessment Patient needs continued PT services  PT Problem List Decreased strength;Decreased mobility;Decreased activity tolerance;Decreased balance;Pain;Cardiopulmonary status limiting activity       PT Treatment Interventions DME instruction;Therapeutic activities;Gait training;Therapeutic exercise;Patient/family education;Balance training;Stair training;Functional mobility training    PT Goals (Current goals can be found in the Care Plan section)  Acute Rehab PT Goals Patient Stated Goal: to breathe better PT Goal Formulation: With patient Time For Goal Achievement: 07/25/20 Potential to Achieve Goals: Good    Frequency Min 3X/week   Barriers to discharge        Co-evaluation               AM-PAC PT "6 Clicks" Mobility  Outcome Measure Help needed turning from your back to your side while in a flat bed without using bedrails?: A Little Help needed moving from lying on your back to sitting on the side of a flat bed without using bedrails?: A Little Help needed moving to and from a bed to a chair (including a wheelchair)?: A Little Help needed standing up from a chair using your arms (e.g., wheelchair or bedside chair)?: A Little Help needed to walk in hospital room?: A Lot Help needed climbing 3-5 steps with a railing? : A Lot 6 Click Score: 16    End of Session Equipment Utilized During Treatment: Oxygen;Gait belt Activity Tolerance: Patient limited by fatigue Patient left: in chair;with call bell/phone within reach;with chair alarm set Nurse Communication: Mobility status PT Visit Diagnosis: Other abnormalities of gait and mobility (R26.89);Muscle weakness (generalized) (M62.81);Pain Pain - Right/Left: Left Pain - part of body: Leg    Time: 1100-1132 PT Time Calculation (min) (ACUTE ONLY): 32 min   Charges:   PT Evaluation $PT Eval Moderate Complexity: 1 Mod PT Treatments $Therapeutic Activity: 8-22 mins         [name], PT Acute Rehabilitation Services CRFVO:[phone] [phone] 07/11/2020   [name] 07/11/2020, 2:17 PM

## 2020-07-12 LAB — GLUCOSE, CAPILLARY
Glucose-Capillary: 146 mg/dL — ABNORMAL HIGH (ref 70–99)
Glucose-Capillary: 148 mg/dL — ABNORMAL HIGH (ref 70–99)
Glucose-Capillary: 245 mg/dL — ABNORMAL HIGH (ref 70–99)
Glucose-Capillary: 123 mg/dL — ABNORMAL HIGH (ref 70–99)
Glucose-Capillary: 162 mg/dL — ABNORMAL HIGH (ref 70–99)

## 2020-07-12 LAB — BASIC METABOLIC PANEL
Anion gap: 12 (ref 5–15)
BUN: 42 mg/dL — ABNORMAL HIGH (ref 8–23)
CO2: 21 mmol/L — ABNORMAL LOW (ref 22–32)
Calcium: 8.5 mg/dL — ABNORMAL LOW (ref 8.9–10.3)
Chloride: 103 mmol/L (ref 98–111)
Creatinine, Ser: 2.54 mg/dL — ABNORMAL HIGH (ref 0.44–1.00)
GFR calc Af Amer: 21 mL/min — ABNORMAL LOW (ref >60)
GFR calc non Af Amer: 18 mL/min — ABNORMAL LOW (ref >60)
Glucose, Bld: 166 mg/dL — ABNORMAL HIGH (ref 70–99)
Potassium: 4.2 mmol/L (ref 3.5–5.1)
Sodium: 136 mmol/L (ref 135–145)

## 2020-07-12 LAB — PROCALCITONIN: Procalcitonin: 24.59 ng/mL

## 2020-07-12 LAB — CBC
HCT: 30.8 % — ABNORMAL LOW (ref 36.0–46.0)
Hemoglobin: 10 g/dL — ABNORMAL LOW (ref 12.0–15.0)
MCH: 27.9 pg (ref 26.0–34.0)
MCHC: 32.5 g/dL (ref 30.0–36.0)
MCV: 85.8 fL (ref 80.0–100.0)
Platelets: 270 10*3/uL (ref 150–400)
RBC: 3.59 MIL/uL — ABNORMAL LOW (ref 3.87–5.11)
RDW: 14.3 % (ref 11.5–15.5)
WBC: 11 10*3/uL — ABNORMAL HIGH (ref 4.0–10.5)
nRBC: 0 % (ref 0.0–0.2)

## 2020-07-12 LAB — CULTURE, BLOOD (ROUTINE X 2): Special Requests: ADEQUATE

## 2020-07-12 LAB — MAGNESIUM: Magnesium: 2.2 mg/dL (ref 1.7–2.4)

## 2020-07-12 LAB — D-DIMER, QUANTITATIVE: D-Dimer, Quant: 2.59 ug/mL-FEU — ABNORMAL HIGH (ref 0.00–0.50)

## 2020-07-12 MED ORDER — LEVALBUTEROL HCL 0.63 MG/3ML IN NEBU: 0.6300 mg | INHALATION_SOLUTION | Freq: Once | RESPIRATORY_TRACT | Status: DC

## 2020-07-12 MED ORDER — PENICILLIN G POTASSIUM 20000000 UNITS IJ SOLR
6.0000 10*6.[IU] | Freq: Two times a day (BID) | INTRAVENOUS | Status: DC
  Administered 2020-07-12 – 2020-07-13 (×2): 6 10*6.[IU] via INTRAVENOUS
  Filled 2020-07-12 (×4): qty 6

## 2020-07-12 MED ORDER — FUROSEMIDE 10 MG/ML IJ SOLN
40.0000 mg | Freq: Once | INTRAMUSCULAR | Status: AC
  Administered 2020-07-12: 40 mg via INTRAVENOUS
  Filled 2020-07-12: qty 4

## 2020-07-12 NOTE — Progress Notes (Signed)
Patients up in chair today and SAT remain 89-90% on oxygen 6lpm.

## 2020-07-12 NOTE — Evaluation (Signed)
Occupational Therapy Evaluation Patient Details Name: [name] MRN: [id number] DOB: 21-Aug-1946 Today's Date: 07/12/2020    History of Present Illness [name] is a 74 year old female with past medical history notable for peripheral vascular disease, hypertension, hyperlipidemia, type 2 diabetes mellitus, CKD, CAD who presented to the ED with shortness of breath.  Found to have sepsis due to left lower extremity cellulitis.   Clinical Impression   PTA pt living with daughter and functioning at mod I level for ADLs, while daughter manages IADLs. At time of eval, pt presents with ability to complete bed mobility at min A level and sit <> stands at min A level with RW. Once standing, pt completed BSC stand pivot transfer with mod A to maintain safety with posterior lean. Pt also incontinent mid transfer, requiring total A for peri care. She currently requires max A to manage LB ADLs. During session, pt on 6L HFNC (at baseline does not wear O2) with O2 sats ~89-90% with mobility. Increased time and cues needed to begin implementation of ECS skills. Given current status, recommend SNF vs HHOT pending progress. Will continue to follow per POC listed below.     Follow Up Recommendations  SNF;Home health OT;Supervision/Assistance - 24 hour (pending progress)    Equipment Recommendations  3 in 1 bedside commode;Wheelchair (measurements OT);Wheelchair cushion (measurements OT)    Recommendations for Other Services       Precautions / Restrictions Precautions Precautions: Fall Restrictions Weight Bearing Restrictions: No      Mobility Bed Mobility Overal bed mobility: Needs Assistance Bed Mobility: Supine to Sit     Supine to sit: Min assist     General bed mobility comments: assist to lift trunk upright  Transfers Overall transfer level: Needs assistance Equipment used: Rolling walker (2 wheeled) Transfers: Sit to/from [facility] Sit to Stand: Min  assist Stand pivot transfers: Mod assist       General transfer comment: min A to rise and steady with sit <> stand; mod A to maintain posterior lean and quick descent with transfer    Balance Overall balance assessment: Needs assistance Sitting-balance support: Feet supported Sitting balance-Leahy Scale: Fair     Standing balance support: Bilateral upper extremity supported;During functional activity Standing balance-Leahy Scale: Poor Standing balance comment: UE support for balance; could not remove hands from RW in standing to participate in posterior peri care                           ADL either performed or assessed with clinical judgement   ADL Overall ADL's : Needs assistance/impaired Eating/Feeding: Set up;Sitting   Grooming: Set up;Sitting   Upper Body Bathing: Minimal assistance;Sitting   Lower Body Bathing: Maximal assistance;Sitting/lateral leans;Sit to/from stand   Upper Body Dressing : Minimal assistance;Sitting   Lower Body Dressing: Maximal assistance;Sitting/lateral leans;Sit to/from stand   Toilet Transfer: Moderate assistance;Stand-pivot;BSC;RW Toilet Transfer Details (indicate cue type and reason): t/f startes as min A with RW, but pt began to have posterior lean with quick descent requiring mod A to maintain safety to [place]- Clothing Manipulation and Hygiene: Total assistance;Sit to/from stand Toileting - Clothing Manipulation Details (indicate cue type and reason): pt needed to maintain UE support on RW while OT performed posterior peri care. Pt was also incontinent of stool mid transfer to [facility]     Functional mobility during ADLs: Minimal assistance;Moderate assistance;Rolling walker;Cueing for safety       Vision Baseline Vision/History:  Wears glasses Wears Glasses: At all times Patient Visual Report: No change from baseline       Perception     Praxis      Pertinent Vitals/Pain Pain Assessment: Faces Faces Pain Scale:  Hurts a little bit Pain Location: L LE with movement Pain Descriptors / Indicators: Grimacing;Guarding;Discomfort Pain Intervention(s): Monitored during session;Repositioned;Limited activity within patient's tolerance     Hand Dominance     Extremity/Trunk Assessment Upper Extremity Assessment Upper Extremity Assessment: Generalized weakness   Lower Extremity Assessment Lower Extremity Assessment: Generalized weakness       Communication Communication Communication: No difficulties   Cognition Arousal/Alertness: Awake/alert Behavior During Therapy: WFL for tasks assessed/performed Overall Cognitive Status: No family/caregiver present to determine baseline cognitive functioning Area of Impairment: Safety/judgement                         Safety/Judgement: Decreased awareness of deficits     General Comments: cues and increased time needed to process severity of deficits   General Comments       Exercises     Shoulder Instructions      Home Living Family/patient expects to be discharged to:: Private residence Living Arrangements: Children Available Help at Discharge: Family Type of Home: House Home Access: Level entry     Home Layout: Two level;Bed/bath upstairs Alternate Level Stairs-Number of Steps: 14 Alternate Level Stairs-Rails: Right Bathroom Shower/Tub: Teacher, early years/pre: Standard     Home Equipment: Cane - single point;Tub bench          Prior Functioning/Environment Level of Independence: Independent with assistive device(s)        Comments: walks with cane, takes care of her room but daugther does grocery shopping and cleaning/cooking        OT Problem List: Decreased strength;Decreased knowledge of use of DME or AE;Decreased knowledge of precautions;Decreased activity tolerance;Cardiopulmonary status limiting activity;Impaired balance (sitting and/or standing);Decreased safety awareness      OT  Treatment/Interventions: Self-care/ADL training;Therapeutic exercise;Patient/family education;Balance training;Energy conservation;Therapeutic activities;DME and/or AE instruction    OT Goals(Current goals can be found in the care plan section) Acute Rehab OT Goals Patient Stated Goal: to breathe better OT Goal Formulation: With patient Time For Goal Achievement: 07/26/20 Potential to Achieve Goals: Good  OT Frequency: Min 2X/week   Barriers to D/C:            Co-evaluation              AM-PAC OT "6 Clicks" Daily Activity     Outcome Measure Help from another person eating meals?: A Little Help from another person taking care of personal grooming?: A Little Help from another person toileting, which includes using toliet, bedpan, or urinal?: A Lot Help from another person bathing (including washing, rinsing, drying)?: A Lot Help from another person to put on and taking off regular upper body clothing?: A Little Help from another person to put on and taking off regular lower body clothing?: A Lot 6 Click Score: 15   End of Session Equipment Utilized During Treatment: Gait belt;Rolling walker;Oxygen Nurse Communication: Mobility status  Activity Tolerance: Patient tolerated treatment well Patient left: in chair;with call bell/phone within reach;with chair alarm set  OT Visit Diagnosis: Unsteadiness on feet (R26.81);Other abnormalities of gait and mobility (R26.89);Muscle weakness (generalized) (M62.81)                Time: 2751-7001 OT Time Calculation (min): 29 min Charges:  OT General  Charges $OT Visit: 1 Visit OT Evaluation $OT Eval Moderate Complexity: 1 Mod OT Treatments $Self Care/Home Management : 8-22 mins  Zenovia Jarred, MSOT, OTR/L Acute Rehabilitation Services [facility] Office Number: [phone] Pager: [phone]  Zenovia Jarred 07/12/2020, 5:22 PM

## 2020-07-12 NOTE — Progress Notes (Signed)
Continue to have a difficult time maintaining a sat >85% noted especially while sleeping then she mainly mouth breathes.- continues o2 at 6L- slight audible wheeze

## 2020-07-12 NOTE — Significant Event (Signed)
Rapid Response Event Note   Reason for Call : Called to 5N19 due to questioning whether patient appropriate for floor on [place] HFNC at 15L   Initial Focused Assessment:  Patient resting comfortably, in no distress.  Lung sounds diminished with expiratory wheezes and audible wheezes.  Oxygen level 84% on [place].  Observed patient is a mouth breather.  Patient verbalizes not SOB.     Interventions:  Placed patient on venti mask at 45% and changed pulse ox location from finger which was cold to ear and patient maintaining oxygen level 93%.   Plan of Care:  Continue to observe and contact Rapid if patient requiring max oxygen on [place] or if patient starts exhibiting distress  Event Summary:   MD Notified: MD not notified at this point, patient in no distress Call Bland, Tod Abrahamsen Elizabeth, RN

## 2020-07-12 NOTE — Progress Notes (Signed)
PROGRESS NOTE    [name]  TOI:[id number] DOB: 08/21/1946 DOA: 07/09/2020 PCP: [name], MD    Brief Narrative:  [name] is a 74 year old female with past medical history notable for peripheral vascular disease, hypertension, hyperlipidemia, type 2 diabetes mellitus, CKD, CAD who presented to the ED with shortness of breath.  Patient has been feeling ill, with weakness, myalgias over the past few days.  She was seen by her PCP, Dr. [name] on 06/26/2020 and provided antibiotics.   In the ED, physical exam notable for left lower extremity cellulitis with multiple skin breaks and ulcerations.  WBC elevated 16.4.  Chest x-ray with no acute cardiopulmonary disease findings.  TRH was consulted for admission for failed outpatient antibiotic therapy for cellulitis.   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  Recently started on outpatient antibiotics for lower extremity cellulitis without improvement.  On arrival to the ED, patient was noted to have elevated WBC count of 16.4, lactic acid of 2.3, CRP 27.7 with a procalcitonin of 59.95.  Blood cultures x2+ for gram-positive cocci in chains with BCID notable for Streptococcus species.  TTE with LVEF 50-60%, normal LV function, normal RV systolic function, no evidence of valvular vegetations on TTE. --ID following, appreciate assistance --WBC 16.4>15.0>12.8>11.0 --PCT  59.95>39.61>24.59 --Continue ceftriaxone 2 g IV every 24 hours; await further culture susceptibilities prior to de-escalation further --Supportive care, antipyretics  Acute hypoxic respiratory failure Overnight, patient requiring up to 6 L nasal cannula of supplemental oxygen due to hypoxia.  TTE with normal LVEF.  Chest x-ray with mild vascular congestion and basilar atelectasis --D-dimer elevated 2.59 --Check vascular duplex ultrasound bilateral lower extremities, unable to perform CT angiogram chest due to renal dysfunction; will hold off on nuclear medicine VQ scan pending vascular ultrasound, but if negative will order. --Repeat Lasix 40 mg IV x1 today --Continue to titrate supple oxygen to maintain SPO2 greater than 92% --On IV antibiotics as above --Continuous bedside spirometer every 1 hour while awake  Venous stasis ulcers Patient with chronic skin changes bilateral lower extremities with multiple skin breaks and ulcerations, likely secondary to venous stasis dermatitis.  Case was discussed with vascular surgery, Dr. Lurline Hare by admitting physician who recommended venous reflux study and placement of Unna boots by wound care if positive, unfortunately per ultrasound technician, only perform that exam outpatient.  Vascular ultrasound ABI demonstrating moderate arterial disease left lower extremity. --Likely will need close follow-up with vascular surgery after resolution of septicemia outpatient  CKD stage V (baseline Cr 2.6-2.8) --CR 3.11>2.82>2.71>2.54 --Renal diet --Calcitriol --sodium bicarb 650mg  BID --Avoid nephrotoxins, renally dose all medications --Follow BMP daily  Type 2 diabetes mellitus Hemoglobin A1c 7.8 on 06/26/2020.  --Lantus 15 units obviously daily --Moderate dose insulin sliding scale for further coverage --CBGs qAC/HS  Hyperlipidemia: Continue atorvastatin 80 mg p.o. daily  PVD/CAD --Continue aspirin 81 mg p.o. daily, Plavix 75 mg p.o. daily  Essential  hypertension --Continue  amlodipine 10mg  p.o. daily, metoprolol 100mg  BID  Chronic pain syndrome --Continue Zanaflex, Ultram  Morbid obesity BMI 43.8.  Discussed with patient need for aggressive lifestyle changes/weight loss as this complicates all facets of care.  Outpatient follow-up with PCP, considerations of bariatric medicine outpatient referral.  DVT prophylaxis: Lovenox Code Status: Full code Family Communication: Updated patient extensively at bedside, updated patient's daughter [name] via telephone this morning  Disposition Plan:  Status is: Inpatient  Remains inpatient appropriate because:Ongoing diagnostic testing needed not appropriate for outpatient work up, Unsafe d/c plan, IV treatments appropriate due to intensity of illness or inability to take PO and Inpatient level of care appropriate due to severity of illness   Dispo: The patient is from: Home              Anticipated d/c is to: SNF              Anticipated d/c date is: 3 days              Patient currently is not medically stable to d/c.   Consultants:   Infectious disease, Dr. Megan Salon  Vascular surgery, Dr. Carlis Abbott - Case was discussed by admitting physician  Procedures:  TTE IMPRESSIONS  1. Left ventricular ejection fraction, by estimation, is 55 to 60%. The  left ventricle has normal function. The left ventricle has no regional  wall motion abnormalities. Left ventricular diastolic function could not  be evaluated.  2. Right ventricular systolic function is normal. The right ventricular  size is normal. There is normal pulmonary artery systolic pressure. The  estimated right ventricular systolic pressure is 03.5 mmHg.  3. The mitral valve is grossly normal. Mild to moderate mitral valve  regurgitation. No evidence of mitral stenosis.  4. The aortic valve is tricuspid. There is mild calcification of the  aortic valve. There is mild thickening of the aortic valve. Aortic valve  regurgitation is not  visualized. Mild aortic valve sclerosis is present,  with no evidence of aortic valve  stenosis.  5. The inferior vena cava is normal in size with greater than 50%  respiratory variability, suggesting right atrial pressure of 3 mmHg.   Vascular ultrasound ABI: Summary:  Right: Resting right ankle-brachial index is within normal range. No  evidence of significant right lower extremity arterial disease. The right  toe-brachial index is abnormal.   Left: Resting left ankle-brachial index indicates moderate left lower  extremity arterial disease. The left toe-brachial index is abnormal.   Antimicrobials:   Ancef 9/16 9 - 9/16  Vancomycin 9/16 - 9/16  Zosyn 9/16 - 9/16  Ceftriaxone 9/17>>   Subjective: Patient seen and examined bedside, resting comfortably.  Continues to require submental oxygen, now at 7 L per nasal cannula; although resting comfortably without any apparent distress/accessory muscle use.  Also reports weakness/fatigue that persists.  Given significant hypoxia, ordered D-dimer that was elevated.  Receiving additional IV diuresis, neb treatments this morning.  Ordered vascular duplex ultrasound lower extremities to rule out DVT.  Unable to perform CT angiogram secondary to renal dysfunction.  If vascular ultrasound negative for DVT, will need to proceed with nuclear medicine VQ scan for PE evaluation.  Patient without any other complaints or concerns at this time.  Denies headache, no fever/chills/night sweats, no chest pain, palpitations, no abdominal pain.  No other acute events overnight per nursing staff.  Objective: Vitals:   07/11/20 1939 07/12/20 0350 07/12/20 0738 07/12/20 0843  BP: (!) 139/50 138/65 (!) 145/77   Pulse:  87 70 88 88  Resp: 16 18 18 18   Temp: (!) 97.5 F (36.4 C) 98.2 F (36.8 C) 97.7 F (36.5 C)   TempSrc: Oral Oral Oral   SpO2: 92% 90% (!) 85% (!) 88%  Weight:        Intake/Output Summary (Last 24 hours) at 07/12/2020 1241 Last data  filed at 07/12/2020 0855 Gross per 24 hour  Intake 480 ml  Output --  Net 480 ml   Filed Weights   07/10/20 1531  Weight: 104.8 kg    Examination:  General exam: Appears calm and comfortable, ill in appearance Respiratory system: Clear to auscultation. Respiratory effort normal.  On 7 L nasal cannula with SPO2 %. Cardiovascular system: S1 & S2 heard, RRR. No JVD, murmurs, rubs, gallops or clicks. No pedal edema. Gastrointestinal system: Abdomen is nondistended, soft and nontender. No organomegaly or masses felt. Normal bowel sounds heard. Central nervous system: Alert and oriented. No focal neurological deficits. Extremities: Symmetric 5 x 5 power. Skin: Bilateral lower extremities with several areas of skin breaks and ulcerations in various stages of healing, hyperpigmented skin, warm to touch and dorsalis pedis/posterior tibial pulse 2+ Psychiatry: Judgement and insight appear normal. Mood & affect appropriate.     Data Reviewed: I have personally reviewed following labs and imaging studies  CBC: Recent Labs  Lab 07/09/20 1239 07/10/20 0621 07/11/20 0237 07/12/20 0635  WBC 16.4* 15.0* 12.8* 11.0*  NEUTROABS 14.1*  --   --   --   HGB 10.8* 10.4* 9.4* 10.0*  HCT 33.3* 31.7* 29.6* 30.8*  MCV 85.2 84.8 84.6 85.8  PLT 257 228 214 366   Basic Metabolic Panel: Recent Labs  Lab 07/09/20 1239 07/10/20 0621 07/11/20 0237 07/12/20 0635  NA 137 135 138 136  K 3.4* 3.2* 3.5 4.2  CL 101 106 105 103  CO2 23 22 21* 21*  GLUCOSE 282* 233* 171* 166*  BUN 49* 46* 39* 42*  CREATININE 3.11* 2.82* 2.71* 2.54*  CALCIUM 8.2* 7.8* 8.0* 8.5*  MG  --   --  1.6* 2.2   GFR: Estimated Creatinine Clearance: 21 mL/min (A) (by C-G formula based on SCr of 2.54 mg/dL (H)). Liver Function Tests: Recent Labs  Lab 07/09/20 1239  AST 20  ALT 17  ALKPHOS 93  BILITOT 0.8  PROT 6.2*  ALBUMIN 2.8*   No results for input(s): LIPASE, AMYLASE in the last 168 hours. No results for input(s):  AMMONIA in the last 168 hours. Coagulation Profile: Recent Labs  Lab 07/09/20 1239  INR 1.2   Cardiac Enzymes: No results for input(s): CKTOTAL, CKMB, CKMBINDEX, TROPONINI in the last 168 hours. BNP (last 3 results) No results for input(s): PROBNP in the last 8760 hours. HbA1C: No results for input(s): HGBA1C in the last 72 hours. CBG: Recent Labs  Lab 07/11/20 1619 07/11/20 1940 07/12/20 0648 07/12/20 0732 07/12/20 1222  GLUCAP 190* 219* 146* 148* 245*   Lipid Profile: No results for input(s): CHOL, HDL, LDLCALC, TRIG, CHOLHDL, LDLDIRECT in the last 72 hours. Thyroid Function Tests: No results for input(s): TSH, T4TOTAL, FREET4, T3FREE, THYROIDAB in the last 72 hours. Anemia Panel: No results for input(s): VITAMINB12, FOLATE, FERRITIN, TIBC, IRON, RETICCTPCT in the last 72 hours. Sepsis Labs: Recent Labs  Lab 07/09/20 1239 07/09/20 2005 07/11/20 0237 07/12/20 0635  PROCALCITON  --  59.95 39.61 24.59  LATICACIDVEN 2.3*  --   --   --     Recent Results (from the past 240 hour(s))  Blood  Culture (routine x 2)     Status: Abnormal   Collection Time: 07/09/20 12:40 PM   Specimen: BLOOD RIGHT HAND  Result Value Ref Range Status   Specimen Description BLOOD RIGHT HAND  Final   Special Requests   Final    BOTTLES DRAWN AEROBIC AND ANAEROBIC Blood Culture results may not be optimal due to an inadequate volume of blood received in culture bottles   Culture  Setup Time   Final    AEROBIC BOTTLE ONLY GRAM POSITIVE COCCI IN CHAINS CRITICAL VALUE NOTED.  VALUE IS CONSISTENT WITH PREVIOUSLY REPORTED AND CALLED VALUE. Performed at [facility], [place] [address] [place], [place] [address]    Culture STREPTOCOCCUS GROUP G (A)  Final   Report Status 07/12/2020 FINAL  Final   Organism ID, Bacteria STREPTOCOCCUS GROUP G  Final      Susceptibility   Streptococcus group g - MIC*    CLINDAMYCIN <=0.25 SENSITIVE Sensitive     AMPICILLIN <=0.25 SENSITIVE Sensitive      ERYTHROMYCIN <=0.12 SENSITIVE Sensitive     VANCOMYCIN 0.5 SENSITIVE Sensitive     CEFTRIAXONE <=0.12 SENSITIVE Sensitive     LEVOFLOXACIN 0.5 SENSITIVE Sensitive     PENICILLIN Value in next row Sensitive      SENSITIVEBenzylpenicillin=0.06S    * STREPTOCOCCUS GROUP G  Blood Culture (routine x 2)     Status: Abnormal   Collection Time: 07/09/20 12:45 PM   Specimen: BLOOD LEFT HAND  Result Value Ref Range Status   Specimen Description BLOOD LEFT HAND  Final   Special Requests   Final    BOTTLES DRAWN AEROBIC AND ANAEROBIC Blood Culture adequate volume   Culture  Setup Time   Final    IN BOTH AEROBIC AND ANAEROBIC BOTTLES GRAM POSITIVE COCCI IN CHAINS Organism ID to follow CRITICAL RESULT CALLED TO, READ BACK BY AND VERIFIED WITH: V BRYK 07/10/20 0244 JDW    Culture (A)  Final    STREPTOCOCCUS GROUP G SUSCEPTIBILITIES PERFORMED ON PREVIOUS CULTURE WITHIN THE LAST 5 DAYS. Performed at [facility], [place] [address], [place], [place] [address]    Report Status 07/12/2020 FINAL  Final  Blood Culture ID Panel (Reflexed)     Status: Abnormal   Collection Time: 07/09/20 12:45 PM  Result Value Ref Range Status   Enterococcus faecalis NOT DETECTED NOT DETECTED Final   Enterococcus Faecium NOT DETECTED NOT DETECTED Final   Listeria monocytogenes NOT DETECTED NOT DETECTED Final   Staphylococcus species NOT DETECTED NOT DETECTED Final   Staphylococcus aureus (BCID) NOT DETECTED NOT DETECTED Final   Staphylococcus epidermidis NOT DETECTED NOT DETECTED Final   Staphylococcus lugdunensis NOT DETECTED NOT DETECTED Final   Streptococcus species DETECTED (A) NOT DETECTED Final    Comment: Not Enterococcus species, Streptococcus agalactiae, Streptococcus pyogenes, or Streptococcus pneumoniae. CRITICAL RESULT CALLED TO, READ BACK BY AND VERIFIED WITH: V BRYK PHARMD 07/10/20 0244 JDW    Streptococcus agalactiae NOT DETECTED NOT DETECTED Final   Streptococcus pneumoniae NOT DETECTED NOT DETECTED  Final   Streptococcus pyogenes NOT DETECTED NOT DETECTED Final   A.calcoaceticus-baumannii NOT DETECTED NOT DETECTED Final   Bacteroides fragilis NOT DETECTED NOT DETECTED Final   Enterobacterales NOT DETECTED NOT DETECTED Final   Enterobacter cloacae complex NOT DETECTED NOT DETECTED Final   Escherichia coli NOT DETECTED NOT DETECTED Final   Klebsiella aerogenes NOT DETECTED NOT DETECTED Final   Klebsiella oxytoca NOT DETECTED NOT DETECTED Final   Klebsiella pneumoniae NOT DETECTED  NOT DETECTED Final   Proteus species NOT DETECTED NOT DETECTED Final   Salmonella species NOT DETECTED NOT DETECTED Final   Serratia marcescens NOT DETECTED NOT DETECTED Final   Haemophilus influenzae NOT DETECTED NOT DETECTED Final   Neisseria meningitidis NOT DETECTED NOT DETECTED Final   Pseudomonas aeruginosa NOT DETECTED NOT DETECTED Final   Stenotrophomonas maltophilia NOT DETECTED NOT DETECTED Final   Candida albicans NOT DETECTED NOT DETECTED Final   Candida auris NOT DETECTED NOT DETECTED Final   Candida glabrata NOT DETECTED NOT DETECTED Final   Candida krusei NOT DETECTED NOT DETECTED Final   Candida parapsilosis NOT DETECTED NOT DETECTED Final   Candida tropicalis NOT DETECTED NOT DETECTED Final   Cryptococcus neoformans/gattii NOT DETECTED NOT DETECTED Final    Comment: Performed at [facility], [place] [address], [place], [place] [address]         Radiology Studies: DG CHEST PORT 1 VIEW  Result Date: 07/11/2020 CLINICAL DATA:  Dyspnea EXAM: PORTABLE CHEST 1 VIEW COMPARISON:  07/09/2020 chest radiograph. FINDINGS: Stable cardiomediastinal silhouette with mild cardiomegaly. No pneumothorax. No overt pulmonary edema. No pleural effusions. Stable mild bibasilar scarring versus atelectasis. IMPRESSION: 1. Stable mild cardiomegaly without overt pulmonary edema. 2. Stable mild bibasilar scarring versus atelectasis. Electronically Signed   By: Ilona Sorrel M.D.   On: 07/11/2020 11:24         Scheduled Meds: . (feeding supplement) PROSource Plus  30 mL Oral BID BM  . allopurinol  100 mg Oral Daily  . amLODipine  10 mg Oral Daily  . aspirin EC  81 mg Oral Daily  . atorvastatin  80 mg Oral QPM  . calcitRIOL  0.5 mcg Oral Daily  . clopidogrel  75 mg Oral Daily  . enoxaparin (LOVENOX) injection  30 mg Subcutaneous Q24H  . gabapentin  100 mg Oral TID  . influenza vaccine adjuvanted  0.5 mL Intramuscular Tomorrow-1000  . insulin aspart  0-15 Units Subcutaneous TID WC  . insulin aspart  0-5 Units Subcutaneous QHS  . insulin glargine  15 Units Subcutaneous Daily  . levalbuterol  0.63 mg Nebulization Once  . metoprolol tartrate  100 mg Oral BID  . mirabegron ER  25 mg Oral Daily  . multivitamin with minerals  1 tablet Oral Daily  . pantoprazole  40 mg Oral Daily  . sodium bicarbonate  650 mg Oral BID  . sodium chloride flush  3 mL Intravenous Q12H  . timolol  1 drop Both Eyes Daily   Continuous Infusions: . cefTRIAXone (ROCEPHIN)  IV 2 g (07/11/20 1636)  . lactated ringers 100 mL/hr at 07/11/20 1704     LOS: 3 days    Time spent: 39 minutes spent on chart review, discussion with nursing staff, consultants, updating family and interview/physical exam; more than 50% of that time was spent in counseling and/or coordination of care.    Uniqua Kihn J [place] DO [facility] Available via Epic secure chat 7am-7pm After these hours, please refer to coverage provider listed on [email or web address] 07/12/2020, 12:41 PM

## 2020-07-12 NOTE — Progress Notes (Signed)
Patient in bed, SAT 81% on 6lpm [place]. Had patient use incentive spirometer and deep cough. SAT 85%. Notified MD. New order noted.

## 2020-07-13 ENCOUNTER — Inpatient Hospital Stay (HOSPITAL_COMMUNITY): Payer: Medicare Other

## 2020-07-13 DIAGNOSIS — R7989 Other specified abnormal findings of blood chemistry: Secondary | ICD-10-CM

## 2020-07-13 DIAGNOSIS — R609 Edema, unspecified: Secondary | ICD-10-CM

## 2020-07-13 LAB — BASIC METABOLIC PANEL
Anion gap: 12 (ref 5–15)
BUN: 44 mg/dL — ABNORMAL HIGH (ref 8–23)
CO2: 23 mmol/L (ref 22–32)
Calcium: 8.8 mg/dL — ABNORMAL LOW (ref 8.9–10.3)
Chloride: 101 mmol/L (ref 98–111)
Creatinine, Ser: 2.65 mg/dL — ABNORMAL HIGH (ref 0.44–1.00)
GFR calc Af Amer: 20 mL/min — ABNORMAL LOW (ref >60)
GFR calc non Af Amer: 17 mL/min — ABNORMAL LOW (ref >60)
Glucose, Bld: 154 mg/dL — ABNORMAL HIGH (ref 70–99)
Potassium: 3.9 mmol/L (ref 3.5–5.1)
Sodium: 136 mmol/L (ref 135–145)

## 2020-07-13 LAB — CBC
HCT: 32.6 % — ABNORMAL LOW (ref 36.0–46.0)
Hemoglobin: 10.5 g/dL — ABNORMAL LOW (ref 12.0–15.0)
MCH: 27.6 pg (ref 26.0–34.0)
MCHC: 32.2 g/dL (ref 30.0–36.0)
MCV: 85.6 fL (ref 80.0–100.0)
Platelets: 290 10*3/uL (ref 150–400)
RBC: 3.81 MIL/uL — ABNORMAL LOW (ref 3.87–5.11)
RDW: 14.6 % (ref 11.5–15.5)
WBC: 13.2 10*3/uL — ABNORMAL HIGH (ref 4.0–10.5)
nRBC: 0.3 % — ABNORMAL HIGH (ref 0.0–0.2)

## 2020-07-13 LAB — GLUCOSE, CAPILLARY
Glucose-Capillary: 164 mg/dL — ABNORMAL HIGH (ref 70–99)
Glucose-Capillary: 198 mg/dL — ABNORMAL HIGH (ref 70–99)
Glucose-Capillary: 239 mg/dL — ABNORMAL HIGH (ref 70–99)
Glucose-Capillary: 116 mg/dL — ABNORMAL HIGH (ref 70–99)
Glucose-Capillary: 169 mg/dL — ABNORMAL HIGH (ref 70–99)

## 2020-07-13 LAB — PROCALCITONIN: Procalcitonin: 16.36 ng/mL

## 2020-07-13 LAB — MAGNESIUM: Magnesium: 2.2 mg/dL (ref 1.7–2.4)

## 2020-07-13 MED ORDER — AMOXICILLIN 500 MG PO CAPS
500.0000 mg | ORAL_CAPSULE | Freq: Two times a day (BID) | ORAL | Status: AC
  Administered 2020-07-13 – 2020-07-14 (×4): 500 mg via ORAL
  Filled 2020-07-13 (×4): qty 1

## 2020-07-13 NOTE — Consult Note (Signed)
PV Navigator Consult acknowledged and chart reviewed.I was able to meet with patient at bedside to introduce myself and role. Patient lying comfortably in bed with HOB elevated. Denies pain at present time.    She was admitted on 07/10/20 with sepsis/LLE cellulitis. She has been followed by Infectious Disease for IV antibiotics. Progress note from today states infection responding to antibiotics and plan to switch to po amoxicillin for 2 more days treatment. Patient confirms "I am doing a lot better".  Dressing intact to LLE. Bilateral lower extremities warm to touch with faint pedal pulses palpated. Skin hairless with hemosidirin staining noted. Patient says she has seen Dr Donzetta Matters in the past for stents (2019) but has not seen him in a while. Most recent ABI results R= 0.95  L=0.51. She is scheduled for [facility]AS [place] LE Venous for today.  She denies claudication or resting foot pain. She says wounds to LLE "just came up over night". Reports she has seen Dr Dellia Nims at wound clinic in the past for wounds to her RLE(March 2021), that healed. She says she only went to wound clinic x1.    Relevant medical history includes PVD, CAD, DM, HTN, CKD- stage 5 and HLD. She is a former smoker, quit 2009.  Patient says she plans to return home to live with her daughter, who is supportive. She denies issues with transportation to appointments or getting prescriptions. She follows up with podiatry for nail care, PCP for diabetic management, and her cardiologist. Reinforced importance of following up with vascular as well due to PVD and history of LE wounds. Patient asked that I reach out to her daughter [name] to find out when her next appointment with vascular is scheduled. No other questions or needs expressed at this time.   Left patient with contact information should questions/barriers arise.   Thank you, Cletis Media RN BSN [facility] [phone]

## 2020-07-13 NOTE — Progress Notes (Signed)
Physical Therapy Treatment Patient Details Name: [name] MRN: [id number] DOB: 04-11-1946 Today's Date: 07/13/2020    History of Present Illness 74 year old female with past medical history notable for peripheral vascular disease, hypertension, hyperlipidemia, type 2 diabetes mellitus, CKD, CAD who presented to the ED with shortness of breath.  Found to have sepsis due to left lower extremity cellulitis.    PT Comments    Pt was assisted to the chair today, and gentle ROM to legs were done with care for skin changes of cellulitis.  Her tolerance was good, O2 maintained in 90+% range with all therapy.  Follow with care for her tolerances, due to pain in legs, her sats dropping and her balance and endurance being lower.  Has a plan to get home therapy and this is still reasonable with family support.   Follow Up Recommendations  Home health PT;Supervision/Assistance - 24 hour     Equipment Recommendations  Rolling walker with 5" wheels    Recommendations for Other Services       Precautions / Restrictions Precautions Precautions: Fall Precaution Comments: monitor O2 sats wtih mobility Restrictions Weight Bearing Restrictions: No    Mobility  Bed Mobility Overal bed mobility: Needs Assistance Bed Mobility: Supine to Sit     Supine to sit: Min assist     General bed mobility comments: pt is assisted to scoot out to EOB but is fairlyable to help lift trunk off bed  Transfers Overall transfer level: Needs assistance Equipment used: Rolling walker (2 wheeled) Transfers: Sit to/from [facility] Sit to Stand: Min assist Stand pivot transfers: Min assist          Ambulation/Gait Ambulation/Gait assistance: Min assist Gait Distance (Feet): 6 Feet Assistive device: Rolling walker (2 wheeled) Gait Pattern/deviations: Step-through pattern;Step-to pattern;Decreased stride length         Stairs             Wheelchair Mobility     Modified Rankin (Stroke Patients Only)       Balance Overall balance assessment: Needs assistance Sitting-balance support: Feet supported Sitting balance-Leahy Scale: Good     Standing balance support: Bilateral upper extremity supported;During functional activity Standing balance-Leahy Scale: Poor                              Cognition Arousal/Alertness: Awake/alert Behavior During Therapy: WFL for tasks assessed/performed Overall Cognitive Status: No family/caregiver present to determine baseline cognitive functioning Area of Impairment: Awareness;Safety/judgement                         Safety/Judgement: Decreased awareness of safety;Decreased awareness of deficits Awareness: Intellectual          Exercises General Exercises - Lower Extremity Ankle Circles/Pumps: AAROM;5 reps Quad Sets: AROM;10 reps Heel Slides: AAROM;10 reps Hip ABduction/ADduction: AAROM;10 reps    General Comments General comments (skin integrity, edema, etc.): stepped to chair with RW and pt is able to assist with control of descent to chair.  Pillows to position her for comfort and ease of control of posture, support of breathing      Pertinent Vitals/Pain Pain Assessment: No/denies pain    Home Living                      Prior Function            PT Goals (current goals can now be found in  the care plan section) Acute Rehab PT Goals Patient Stated Goal: to breathe better Progress towards PT goals: Progressing toward goals    Frequency    Min 3X/week      PT Plan Current plan remains appropriate    Co-evaluation              AM-PAC PT "6 Clicks" Mobility   Outcome Measure    Help needed moving from lying on your back to sitting on the side of a flat bed without using bedrails?: A Little Help needed moving to and from a bed to a chair (including a wheelchair)?: A Little Help needed standing up from a chair using your arms (e.g.,  wheelchair or bedside chair)?: A Little Help needed to walk in hospital room?: A Little Help needed climbing 3-5 steps with a railing? : A Lot 6 Click Score: 14    End of Session Equipment Utilized During Treatment: Gait belt;Oxygen Activity Tolerance: Patient limited by fatigue Patient left: in chair;with call bell/phone within reach;with chair alarm set Nurse Communication: Mobility status PT Visit Diagnosis: Other abnormalities of gait and mobility (R26.89);Muscle weakness (generalized) (M62.81);Pain Pain - Right/Left: Left Pain - part of body: Leg     Time: 6644-0347 PT Time Calculation (min) (ACUTE ONLY): 28 min  Charges:  $Gait Training: 8-22 mins $Therapeutic Exercise: 8-22 mins                    Ramond Dial 07/13/2020, 7:09 PM  Mee Hives, PT MS Acute Rehab Dept. Number: [place]

## 2020-07-13 NOTE — Progress Notes (Signed)
Patient ID: [name], female   DOB: Sep 07, 1946, 74 y.o.   MRN: [id number]         [facility] for Infectious Disease  Date of Admission:  07/09/2020   Total days of antibiotics 5        Day 2 IV penicillin         ASSESSMENT: Leg cellulitis and streptococcal bacteremia responding to antibiotic therapy.  PLAN: 1. Change IV penicillin to oral amoxicillin and treat for 2 more days 2. 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 (feeding supplement) PROSource Plus  30 mL Oral BID BM  . allopurinol  100 mg Oral Daily  . amLODipine  10 mg Oral Daily  . amoxicillin  500 mg Oral Q12H  . aspirin EC  81 mg Oral Daily  . atorvastatin  80 mg Oral QPM  . calcitRIOL  0.5 mcg Oral Daily  . clopidogrel  75 mg Oral Daily  . enoxaparin (LOVENOX) injection  30 mg Subcutaneous Q24H  . gabapentin  100 mg Oral TID  . influenza vaccine adjuvanted  0.5 mL Intramuscular Tomorrow-1000  . insulin aspart  0-15 Units Subcutaneous TID WC  . insulin aspart  0-5 Units Subcutaneous QHS  . insulin glargine  15 Units Subcutaneous Daily  . levalbuterol  0.63 mg Nebulization Once  . metoprolol tartrate  100 mg Oral BID  . mirabegron ER  25 mg Oral Daily  . multivitamin with minerals  1 tablet Oral Daily  . pantoprazole  40 mg Oral Daily  . sodium bicarbonate  650 mg Oral BID  . sodium chloride flush  3 mL Intravenous Q12H  . timolol  1 drop Both Eyes Daily   Continuous Infusions: . lactated ringers 100 mL/hr at 07/11/20 1704   PRN Meds:.acetaminophen **OR** acetaminophen, levalbuterol, ondansetron **OR** ondansetron  (ZOFRAN) IV, polyethylene glycol, tiZANidine, traMADol, zolpidem   SUBJECTIVE: She is feeling better.  Review of Systems: Review of Systems  Constitutional: Negative for chills, diaphoresis and fever.    No Known Allergies  OBJECTIVE: Vitals:   07/12/20 2200 07/12/20 2205 07/13/20 0345 07/13/20 0833  BP:   (!) 157/72 (!) 148/67  Pulse:   82 85  Resp:   16 20  Temp:   98.3 F (36.8 C) 97.7 F (36.5 C)  TempSrc:   Oral Oral  SpO2: 93% 92% 90% 90%  Weight:       Body mass index is 43.65 kg/m.  Physical Exam Constitutional:      Comments: She is alert and comfortable.  Pulmonary:     Comments: She is wearing mask O2. Skin:    Comments: Left leg cellulitis is 90% resolved.  Psychiatric:        Mood and Affect: Mood normal.     Lab Results Lab Results  Component Value Date   WBC 13.2 (H) 07/13/2020   HGB 10.5 (L) 07/13/2020   HCT 32.6 (L) 07/13/2020   MCV 85.6 07/13/2020   PLT 290 07/13/2020    Lab Results  Component Value Date   CREATININE 2.65 (H) 07/13/2020  BUN 44 (H) 07/13/2020   NA 136 07/13/2020   K 3.9 07/13/2020   CL 101 07/13/2020   CO2 23 07/13/2020    Lab Results  Component Value Date   ALT 17 07/09/2020   AST 20 07/09/2020   ALKPHOS 93 07/09/2020   BILITOT 0.8 07/09/2020     Microbiology: Recent Results (from the past 240 hour(s))  Blood Culture (routine x 2)     Status: Abnormal   Collection Time: 07/09/20 12:40 PM   Specimen: BLOOD RIGHT HAND  Result Value Ref Range Status   Specimen Description BLOOD RIGHT HAND  Final   Special Requests   Final    BOTTLES DRAWN AEROBIC AND ANAEROBIC Blood Culture results may not be optimal due to an inadequate volume of blood received in culture bottles   Culture  Setup Time   Final    AEROBIC BOTTLE ONLY GRAM POSITIVE COCCI IN CHAINS CRITICAL VALUE NOTED.  VALUE IS CONSISTENT WITH PREVIOUSLY REPORTED AND CALLED VALUE. Performed at [facility], [place] [address] [place], [place]  [address]    Culture STREPTOCOCCUS GROUP G (A)  Final   Report Status 07/12/2020 FINAL  Final   Organism ID, Bacteria STREPTOCOCCUS GROUP G  Final      Susceptibility   Streptococcus group g - MIC*    CLINDAMYCIN <=0.25 SENSITIVE Sensitive     AMPICILLIN <=0.25 SENSITIVE Sensitive     ERYTHROMYCIN <=0.12 SENSITIVE Sensitive     VANCOMYCIN 0.5 SENSITIVE Sensitive     CEFTRIAXONE <=0.12 SENSITIVE Sensitive     LEVOFLOXACIN 0.5 SENSITIVE Sensitive     PENICILLIN Value in next row Sensitive      SENSITIVEBenzylpenicillin=0.06S    * STREPTOCOCCUS GROUP G  Blood Culture (routine x 2)     Status: Abnormal   Collection Time: 07/09/20 12:45 PM   Specimen: BLOOD LEFT HAND  Result Value Ref Range Status   Specimen Description BLOOD LEFT HAND  Final   Special Requests   Final    BOTTLES DRAWN AEROBIC AND ANAEROBIC Blood Culture adequate volume   Culture  Setup Time   Final    IN BOTH AEROBIC AND ANAEROBIC BOTTLES GRAM POSITIVE COCCI IN CHAINS Organism ID to follow CRITICAL RESULT CALLED TO, READ BACK BY AND VERIFIED WITH: V BRYK 07/10/20 0244 JDW    Culture (A)  Final    STREPTOCOCCUS GROUP G SUSCEPTIBILITIES PERFORMED ON PREVIOUS CULTURE WITHIN THE LAST 5 DAYS. Performed at [facility], [place] [address], [place], [place] [address]    Report Status 07/12/2020 FINAL  Final  Blood Culture ID Panel (Reflexed)     Status: Abnormal   Collection Time: 07/09/20 12:45 PM  Result Value Ref Range Status   Enterococcus faecalis NOT DETECTED NOT DETECTED Final   Enterococcus Faecium NOT DETECTED NOT DETECTED Final   Listeria monocytogenes NOT DETECTED NOT DETECTED Final   Staphylococcus species NOT DETECTED NOT DETECTED Final   Staphylococcus aureus (BCID) NOT DETECTED NOT DETECTED Final   Staphylococcus epidermidis NOT DETECTED NOT DETECTED Final   Staphylococcus lugdunensis NOT DETECTED NOT DETECTED Final   Streptococcus species DETECTED (A) NOT DETECTED Final    Comment: Not Enterococcus  species, Streptococcus agalactiae, Streptococcus pyogenes, or Streptococcus pneumoniae. CRITICAL RESULT CALLED TO, READ BACK BY AND VERIFIED WITH: V BRYK PHARMD 07/10/20 0244 JDW    Streptococcus agalactiae NOT DETECTED NOT DETECTED Final   Streptococcus pneumoniae NOT DETECTED NOT DETECTED Final   Streptococcus pyogenes NOT DETECTED NOT DETECTED Final  A.calcoaceticus-baumannii NOT DETECTED NOT DETECTED Final   Bacteroides fragilis NOT DETECTED NOT DETECTED Final   Enterobacterales NOT DETECTED NOT DETECTED Final   Enterobacter cloacae complex NOT DETECTED NOT DETECTED Final   Escherichia coli NOT DETECTED NOT DETECTED Final   Klebsiella aerogenes NOT DETECTED NOT DETECTED Final   Klebsiella oxytoca NOT DETECTED NOT DETECTED Final   Klebsiella pneumoniae NOT DETECTED NOT DETECTED Final   Proteus species NOT DETECTED NOT DETECTED Final   Salmonella species NOT DETECTED NOT DETECTED Final   Serratia marcescens NOT DETECTED NOT DETECTED Final   Haemophilus influenzae NOT DETECTED NOT DETECTED Final   Neisseria meningitidis NOT DETECTED NOT DETECTED Final   Pseudomonas aeruginosa NOT DETECTED NOT DETECTED Final   Stenotrophomonas maltophilia NOT DETECTED NOT DETECTED Final   Candida albicans NOT DETECTED NOT DETECTED Final   Candida auris NOT DETECTED NOT DETECTED Final   Candida glabrata NOT DETECTED NOT DETECTED Final   Candida krusei NOT DETECTED NOT DETECTED Final   Candida parapsilosis NOT DETECTED NOT DETECTED Final   Candida tropicalis NOT DETECTED NOT DETECTED Final   Cryptococcus neoformans/gattii NOT DETECTED NOT DETECTED Final    Comment: Performed at [facility], [place]. [address] [place], [place] [address]    Michel Bickers, [place] [facility]ious [place] [facility] [phone] pager   [phone] cell 07/13/2020, 11:32 AM

## 2020-07-13 NOTE — Progress Notes (Signed)
PROGRESS NOTE    [name]  IRS:[id number] DOB: 03/01/1946 DOA: 07/09/2020 PCP: [name], MD    Brief Narrative:  [name] is a 74 year old female with past medical history notable for peripheral vascular disease, hypertension, hyperlipidemia, type 2 diabetes mellitus, CKD, CAD who presented to the ED with shortness of breath.  Patient has been feeling ill, with weakness, myalgias over the past few days.  She was seen by her PCP, Dr. [name] on 06/26/2020 and provided antibiotics.   In the ED, physical exam notable for left lower extremity cellulitis with multiple skin breaks and ulcerations.  WBC elevated 16.4.  Chest x-ray with no acute cardiopulmonary disease findings.  TRH was consulted for admission for failed outpatient antibiotic therapy for cellulitis.   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  Recently started on outpatient antibiotics for lower extremity cellulitis without improvement.  On arrival to the ED, patient was noted to have elevated WBC count of 16.4, lactic acid of 2.3, CRP 27.7 with a procalcitonin of 59.95.  Blood cultures x2+ for gram-positive cocci in chains with BCID notable for Streptococcus species.  TTE with LVEF 50-60%, normal LV function, normal RV systolic function, no evidence of valvular vegetations on TTE. --WBC 16.4>15.0>12.8>11.0>13.2 --PCT 59.95>39.61>24.59>16.36 --Ceftriaxone> IV  PCN>Amoxicillin 500mg  PO BID to complete 7 day course of antibiotics per ID, now signed off --Supportive care, antipyretics  Acute hypoxic respiratory failure Overnight, patient requiring up to 6 L nasal cannula of supplemental oxygen due to hypoxia.  TTE with normal LVEF.  Chest x-ray with mild vascular congestion and basilar atelectasis --D-dimer elevated 2.59 --Check vascular duplex ultrasound bilateral lower extremities, unable to perform CT angiogram chest due to renal dysfunction; will hold off on nuclear medicine VQ scan pending vascular ultrasound, but if negative will order. 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  Case was discussed with vascular surgery, Dr. Lurline Hare by admitting physician who recommended venous reflux study and placement of Unna boots by wound care if positive, unfortunately per ultrasound technician, only perform that exam outpatient.  Vascular ultrasound ABI demonstrating moderate arterial disease left lower extremity. --Likely will need close follow-up with vascular surgery after resolution of septicemia outpatient  CKD stage V (baseline Cr 2.6-2.8) --CR 3.11>2.82>2.71>2.54>2.65 --Renal diet --Calcitriol --sodium bicarb 650mg  BID --Avoid nephrotoxins, renally dose all medications --Follow BMP daily  Type 2 diabetes mellitus Hemoglobin A1c 7.8 on 06/26/2020.  --Lantus 15 units obviously daily --Moderate dose insulin sliding scale for further coverage --CBGs qAC/HS  Hyperlipidemia: Continue atorvastatin 80 mg p.o. daily  PVD/CAD --Continue aspirin 81 mg p.o. daily, Plavix 75 mg p.o. daily  Essential hypertension --Continue amlodipine 10mg   p.o. daily,  metoprolol 100mg  BID  Chronic pain syndrome --Continue Zanaflex, Ultram  Morbid obesity BMI 43.8.  Discussed with patient need for aggressive lifestyle changes/weight loss as this complicates all facets of care.  Outpatient follow-up with PCP, considerations of bariatric medicine outpatient referral.  DVT prophylaxis: Lovenox Code Status: Full code Family Communication: Updated patient extensively at bedside  Disposition Plan:  Status is: Inpatient  Remains inpatient appropriate because:Ongoing diagnostic testing needed not appropriate for outpatient work up, Unsafe d/c plan, IV treatments appropriate due to intensity of illness or inability to take PO and Inpatient level of care appropriate due to severity of illness   Dispo: The patient is from: Home              Anticipated d/c is to: SNF              Anticipated d/c date is: 3 days              Patient currently is not medically stable to d/c.   Consultants:   Infectious disease, Dr. Megan Salon  Vascular surgery, Dr. Carlis Abbott - Case was discussed by admitting physician  Procedures:  TTE IMPRESSIONS  1. Left ventricular ejection fraction, by estimation, is 55 to 60%. The  left ventricle has normal function. The left ventricle has no regional  wall motion abnormalities. Left ventricular diastolic function could not  be evaluated.  2. Right ventricular systolic function is normal. The right ventricular  size is normal. There is normal pulmonary artery systolic pressure. The  estimated right ventricular systolic pressure is 36.1 mmHg.  3. The mitral valve is grossly normal. Mild to moderate mitral valve  regurgitation. No evidence of mitral stenosis.  4. The aortic valve is tricuspid. There is mild calcification of the  aortic valve. There is mild thickening of the aortic valve. Aortic valve  regurgitation is not visualized. Mild aortic valve sclerosis is present,  with no evidence of aortic valve  stenosis.    5. The inferior vena cava is normal in size with greater than 50%  respiratory variability, suggesting right atrial pressure of 3 mmHg.   Vascular ultrasound ABI: Summary:  Right: Resting right ankle-brachial index is within normal range. No  evidence of significant right lower extremity arterial disease. The right  toe-brachial index is abnormal.   Left: Resting left ankle-brachial index indicates moderate left lower  extremity arterial disease. The left toe-brachial index is abnormal.   Antimicrobials:   Ancef 9/16 9 - 9/16  Vancomycin 9/16 - 9/16  Zosyn 9/16 - 9/16  Ceftriaxone 9/17 - 9/19  IV PCN 9/19 - 9/20  Amoxicillin 9/20>>   Subjective: Patient seen and examined bedside, resting comfortably.  Continues to require submental oxygen, now placed on Ventimask overnight following rapid response for concerns of high FiO2 requirements on nasal cannula.  There were unresponsive, patient comfortable, alert in no distress, she was switched from nasal cannula to Ventimask due to mouth breathing.  Awaiting completion of vascular duplex ultrasound lower extremities to rule out DVT.  Unable to perform CT angiogram secondary to renal dysfunction.  If vascular ultrasound negative for DVT, will need to proceed with nuclear medicine VQ scan for PE evaluation. Patient without any other complaints or concerns at this time.  Denies headache, no fever/chills/night sweats, no chest pain, palpitations, no abdominal pain.  No other acute events overnight per nursing staff.  Objective: Vitals:   07/12/20 2200 07/12/20 2205 07/13/20 0345 07/13/20 0833  BP:   (!) 157/72 (!) 148/67  Pulse:  82 85  Resp:   16 20  Temp:   98.3 F (36.8 C) 97.7 F (36.5 C)  TempSrc:   Oral Oral  SpO2: 93% 92% 90% 90%  Weight:        Intake/Output Summary (Last 24 hours) at 07/13/2020 1402 Last data filed at 07/13/2020 0835 Gross per 24 hour  Intake 240 ml  Output --  Net 240 ml   Filed Weights    07/10/20 1531  Weight: 104.8 kg    Examination:  General exam: Appears calm and comfortable, chronically ill in appearance Respiratory system: Clear to auscultation. Respiratory effort normal.  On Ventimask with FiO2 45% with SPO2 94% Cardiovascular system: S1 & S2 heard, RRR. No JVD, murmurs, rubs, gallops or clicks. No pedal edema. Gastrointestinal system: Abdomen is nondistended, soft and nontender. No organomegaly or masses felt. Normal bowel sounds heard. Central nervous system: Alert and oriented. No focal neurological deficits. Extremities: Symmetric 5 x 5 power. Skin: Bilateral lower extremities with several areas of skin breaks and ulcerations in various stages of healing, hyperpigmented skin, warm to touch and dorsalis pedis/posterior tibial pulse 2+ Psychiatry: Judgement and insight appear poor. Mood & affect appropriate.     Data Reviewed: I have personally reviewed following labs and imaging studies  CBC: Recent Labs  Lab 07/09/20 1239 07/10/20 0621 07/11/20 0237 07/12/20 0635 07/13/20 0210  WBC 16.4* 15.0* 12.8* 11.0* 13.2*  NEUTROABS 14.1*  --   --   --   --   HGB 10.8* 10.4* 9.4* 10.0* 10.5*  HCT 33.3* 31.7* 29.6* 30.8* 32.6*  MCV 85.2 84.8 84.6 85.8 85.6  PLT 257 228 214 270 706   Basic Metabolic Panel: Recent Labs  Lab 07/09/20 1239 07/10/20 0621 07/11/20 0237 07/12/20 0635 07/13/20 0210  NA 137 135 138 136 136  K 3.4* 3.2* 3.5 4.2 3.9  CL 101 106 105 103 101  CO2 23 22 21* 21* 23  GLUCOSE 282* 233* 171* 166* 154*  BUN 49* 46* 39* 42* 44*  CREATININE 3.11* 2.82* 2.71* 2.54* 2.65*  CALCIUM 8.2* 7.8* 8.0* 8.5* 8.8*  MG  --   --  1.6* 2.2 2.2   GFR: Estimated Creatinine Clearance: 20.1 mL/min (A) (by C-G formula based on SCr of 2.65 mg/dL (H)). Liver Function Tests: Recent Labs  Lab 07/09/20 1239  AST 20  ALT 17  ALKPHOS 93  BILITOT 0.8  PROT 6.2*  ALBUMIN 2.8*   No results for input(s): LIPASE, AMYLASE in the last 168 hours. No results  for input(s): AMMONIA in the last 168 hours. Coagulation Profile: Recent Labs  Lab 07/09/20 1239  INR 1.2   Cardiac Enzymes: No results for input(s): CKTOTAL, CKMB, CKMBINDEX, TROPONINI in the last 168 hours. BNP (last 3 results) No results for input(s): PROBNP in the last 8760 hours. HbA1C: No results for input(s): HGBA1C in the last 72 hours. CBG: Recent Labs  Lab 07/12/20 1642 07/12/20 2021 07/13/20 0706 07/13/20 0830 07/13/20 1121  GLUCAP 123* 162* 164* 198* 239*   Lipid Profile: No results for input(s): CHOL, HDL, LDLCALC, TRIG, CHOLHDL, LDLDIRECT in the last 72 hours. Thyroid Function Tests: No results for input(s): TSH, T4TOTAL, FREET4, T3FREE, THYROIDAB in the last 72 hours. Anemia Panel: No results for input(s): VITAMINB12, FOLATE, FERRITIN, TIBC, IRON, RETICCTPCT in the last 72 hours. Sepsis Labs: Recent Labs  Lab 07/09/20 1239 07/09/20 2005 07/11/20 0237 07/12/20 0635 07/13/20 0210  PROCALCITON  --  59.95 39.61 24.59 16.36  LATICACIDVEN 2.3*  --   --   --   --  Recent Results (from the past 240 hour(s))  Blood Culture (routine x 2)     Status: Abnormal   Collection Time: 07/09/20 12:40 PM   Specimen: BLOOD RIGHT HAND  Result Value Ref Range Status   Specimen Description BLOOD RIGHT HAND  Final   Special Requests   Final    BOTTLES DRAWN AEROBIC AND ANAEROBIC Blood Culture results may not be optimal due to an inadequate volume of blood received in culture bottles   Culture  Setup Time   Final    AEROBIC BOTTLE ONLY GRAM POSITIVE COCCI IN CHAINS CRITICAL VALUE NOTED.  VALUE IS CONSISTENT WITH PREVIOUSLY REPORTED AND CALLED VALUE. Performed at [facility], [place] [address] [place], [place] [address]    Culture STREPTOCOCCUS GROUP G (A)  Final   Report Status 07/12/2020 FINAL  Final   Organism ID, Bacteria STREPTOCOCCUS GROUP G  Final      Susceptibility   Streptococcus group g - MIC*    CLINDAMYCIN <=0.25 SENSITIVE Sensitive     AMPICILLIN  <=0.25 SENSITIVE Sensitive     ERYTHROMYCIN <=0.12 SENSITIVE Sensitive     VANCOMYCIN 0.5 SENSITIVE Sensitive     CEFTRIAXONE <=0.12 SENSITIVE Sensitive     LEVOFLOXACIN 0.5 SENSITIVE Sensitive     PENICILLIN Value in next row Sensitive      SENSITIVEBenzylpenicillin=0.06S    * STREPTOCOCCUS GROUP G  Blood Culture (routine x 2)     Status: Abnormal   Collection Time: 07/09/20 12:45 PM   Specimen: BLOOD LEFT HAND  Result Value Ref Range Status   Specimen Description BLOOD LEFT HAND  Final   Special Requests   Final    BOTTLES DRAWN AEROBIC AND ANAEROBIC Blood Culture adequate volume   Culture  Setup Time   Final    IN BOTH AEROBIC AND ANAEROBIC BOTTLES GRAM POSITIVE COCCI IN CHAINS Organism ID to follow CRITICAL RESULT CALLED TO, READ BACK BY AND VERIFIED WITH: V BRYK 07/10/20 0244 JDW    Culture (A)  Final    STREPTOCOCCUS GROUP G SUSCEPTIBILITIES PERFORMED ON PREVIOUS CULTURE WITHIN THE LAST 5 DAYS. Performed at [facility], [place] [address] [place], [place] [address]    Report Status 07/12/2020 FINAL  Final  Blood Culture ID Panel (Reflexed)     Status: Abnormal   Collection Time: 07/09/20 12:45 PM  Result Value Ref Range Status   Enterococcus faecalis NOT DETECTED NOT DETECTED Final   Enterococcus Faecium NOT DETECTED NOT DETECTED Final   Listeria monocytogenes NOT DETECTED NOT DETECTED Final   Staphylococcus species NOT DETECTED NOT DETECTED Final   Staphylococcus aureus (BCID) NOT DETECTED NOT DETECTED Final   Staphylococcus epidermidis NOT DETECTED NOT DETECTED Final   Staphylococcus lugdunensis NOT DETECTED NOT DETECTED Final   Streptococcus species DETECTED (A) NOT DETECTED Final    Comment: Not Enterococcus species, Streptococcus agalactiae, Streptococcus pyogenes, or Streptococcus pneumoniae. CRITICAL RESULT CALLED TO, READ BACK BY AND VERIFIED WITH: V BRYK PHARMD 07/10/20 0244 JDW    Streptococcus agalactiae NOT DETECTED NOT DETECTED Final   Streptococcus  pneumoniae NOT DETECTED NOT DETECTED Final   Streptococcus pyogenes NOT DETECTED NOT DETECTED Final   A.calcoaceticus-baumannii NOT DETECTED NOT DETECTED Final   Bacteroides fragilis NOT DETECTED NOT DETECTED Final   Enterobacterales NOT DETECTED NOT DETECTED Final   Enterobacter cloacae complex NOT DETECTED NOT DETECTED Final   Escherichia coli NOT DETECTED NOT DETECTED Final   Klebsiella aerogenes NOT DETECTED NOT DETECTED Final   Klebsiella oxytoca NOT DETECTED  NOT DETECTED Final   Klebsiella pneumoniae NOT DETECTED NOT DETECTED Final   Proteus species NOT DETECTED NOT DETECTED Final   Salmonella species NOT DETECTED NOT DETECTED Final   Serratia marcescens NOT DETECTED NOT DETECTED Final   Haemophilus influenzae NOT DETECTED NOT DETECTED Final   Neisseria meningitidis NOT DETECTED NOT DETECTED Final   Pseudomonas aeruginosa NOT DETECTED NOT DETECTED Final   Stenotrophomonas maltophilia NOT DETECTED NOT DETECTED Final   Candida albicans NOT DETECTED NOT DETECTED Final   Candida auris NOT DETECTED NOT DETECTED Final   Candida glabrata NOT DETECTED NOT DETECTED Final   Candida krusei NOT DETECTED NOT DETECTED Final   Candida parapsilosis NOT DETECTED NOT DETECTED Final   Candida tropicalis NOT DETECTED NOT DETECTED Final   Cryptococcus neoformans/gattii NOT DETECTED NOT DETECTED Final    Comment: Performed at [facility], [place] [address], [place],  [place] [address]         Radiology Studies: No results found.      Scheduled Meds: . (feeding supplement) PROSource Plus  30 mL Oral BID BM  . allopurinol  100 mg Oral Daily  . amLODipine  10 mg Oral Daily  . amoxicillin  500 mg Oral Q12H  . aspirin EC  81 mg Oral Daily  . atorvastatin  80 mg Oral QPM  . calcitRIOL  0.5 mcg Oral Daily  . clopidogrel  75 mg Oral Daily  . enoxaparin (LOVENOX) injection  30 mg Subcutaneous Q24H  . gabapentin  100 mg Oral TID  . influenza vaccine adjuvanted  0.5 mL Intramuscular  Tomorrow-1000  . insulin aspart  0-15 Units Subcutaneous TID WC  . insulin aspart  0-5 Units Subcutaneous QHS  . insulin glargine  15 Units Subcutaneous Daily  . levalbuterol  0.63 mg Nebulization Once  . metoprolol tartrate  100 mg Oral BID  . mirabegron ER  25 mg Oral Daily  . multivitamin with minerals  1 tablet Oral Daily  . pantoprazole  40 mg Oral Daily  . sodium bicarbonate  650 mg Oral BID  . sodium chloride flush  3 mL Intravenous Q12H  . timolol  1 drop Both Eyes Daily   Continuous Infusions: . lactated ringers 100 mL/hr at 07/11/20 1704     LOS: 4 days    Time spent: 39 minutes spent on chart review, discussion with nursing staff, consultants, updating family and interview/physical exam; more than 50% of that time was spent in counseling and/or coordination of care.    Ulyses Panico J [place] DO [facility] Available via Epic secure chat 7am-7pm After these hours, please refer to coverage provider listed on [email or web address] 07/13/2020, 2:02 PM

## 2020-07-13 NOTE — Progress Notes (Signed)
Lower extremity venous has been completed.   Preliminary results in CV Proc.   [name] 07/13/2020 3:32 PM

## 2020-07-14 ENCOUNTER — Inpatient Hospital Stay (HOSPITAL_COMMUNITY): Payer: Medicare Other

## 2020-07-14 LAB — GLUCOSE, CAPILLARY
Glucose-Capillary: 134 mg/dL — ABNORMAL HIGH (ref 70–99)
Glucose-Capillary: 176 mg/dL — ABNORMAL HIGH (ref 70–99)
Glucose-Capillary: 174 mg/dL — ABNORMAL HIGH (ref 70–99)
Glucose-Capillary: 151 mg/dL — ABNORMAL HIGH (ref 70–99)

## 2020-07-14 LAB — BASIC METABOLIC PANEL
Anion gap: 12 (ref 5–15)
BUN: 43 mg/dL — ABNORMAL HIGH (ref 8–23)
CO2: 24 mmol/L (ref 22–32)
Calcium: 9.3 mg/dL (ref 8.9–10.3)
Chloride: 102 mmol/L (ref 98–111)
Creatinine, Ser: 2.38 mg/dL — ABNORMAL HIGH (ref 0.44–1.00)
GFR calc Af Amer: 22 mL/min — ABNORMAL LOW (ref >60)
GFR calc non Af Amer: 19 mL/min — ABNORMAL LOW (ref >60)
Glucose, Bld: 168 mg/dL — ABNORMAL HIGH (ref 70–99)
Potassium: 3.6 mmol/L (ref 3.5–5.1)
Sodium: 138 mmol/L (ref 135–145)

## 2020-07-14 LAB — CBC
HCT: 32.3 % — ABNORMAL LOW (ref 36.0–46.0)
Hemoglobin: 10.7 g/dL — ABNORMAL LOW (ref 12.0–15.0)
MCH: 27.7 pg (ref 26.0–34.0)
MCHC: 33.1 g/dL (ref 30.0–36.0)
MCV: 83.7 fL (ref 80.0–100.0)
Platelets: 397 10*3/uL (ref 150–400)
RBC: 3.86 MIL/uL — ABNORMAL LOW (ref 3.87–5.11)
RDW: 14.3 % (ref 11.5–15.5)
WBC: 15.5 10*3/uL — ABNORMAL HIGH (ref 4.0–10.5)
nRBC: 0.9 % — ABNORMAL HIGH (ref 0.0–0.2)

## 2020-07-14 LAB — PROCALCITONIN: Procalcitonin: 6.57 ng/mL

## 2020-07-14 LAB — SARS CORONAVIRUS 2 BY RT PCR (HOSPITAL ORDER, PERFORMED IN ~~LOC~~ HOSPITAL LAB): SARS Coronavirus 2: NEGATIVE

## 2020-07-14 LAB — MAGNESIUM: Magnesium: 2 mg/dL (ref 1.7–2.4)

## 2020-07-14 MED ORDER — TECHNETIUM TO 99M ALBUMIN AGGREGATED: 3.9000 | Freq: Once | INTRAVENOUS | Status: DC | PRN

## 2020-07-14 NOTE — Progress Notes (Signed)
Nutrition Follow-up  RD working remotely.  DOCUMENTATION CODES:   Obesity unspecified  INTERVENTION:   -Continue 30 ml Prosource Plus BID, each supplement provides 100 kcals and 15 grams protein -Continue MVI with minerals daily  NUTRITION DIAGNOSIS:   Increased nutrient needs related to wound healing as evidenced by estimated needs.  Ongoing  GOAL:   Patient will meet greater than or equal to 90% of their needs  Progressing  MONITOR:   PO intake, Supplement acceptance, Labs, Weight trends, Skin, I & O's  REASON FOR ASSESSMENT:   Consult Wound healing  ASSESSMENT:   [name] is a 74 y.o. female with medical history significant of PVD; HTN; HLD; DM; CKD; and CAD presenting with SOB.  She reports that she has been feeling SOB and kind of weak for a couple of days.  She was seen by Dr. [name] on 9/3 and he provided antibiotics but she isn't sure why.  She did vomit once yesterday.  No diarrhea.  She is hurting a little in her joints, "all of them."  No fever.  No urinary symptoms.  No cough.  Reviewed I/O's: +840 ml x 24 hours and +3.5 L since admission  Attempted to speak with pt via call to hospital room phone, however, no answer.   Pt remains with good appetite; noted meal completion 75-100%. Pt is taking MVI and Prosource Plus supplements.   Per chart review, plan to d/c home once medically stable.  Medications reviewed and include lactated ringers infusion @ 100 ml/hr.   Lab Results  Component Value Date   HGBA1C 7.8 (H) 06/26/2020   PTA DM medications are .   Labs reviewed: CBGS: 174-176 (inpatient orders for glycemic control are 0-15 units insulin aspart TID with meals, 0-5 units insulin aspart daily at bedtime, and 15 units insulin glargine daily).   Diet Order:   Diet Order            Diet renal/carb modified with fluid restriction Diet-HS Snack? Nothing; Room service appropriate? 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  Estimated Nutritional Needs:   Kcal:  1700-1900  Protein:  105-120 grams  Fluid:  1.7-1.9 L    Loistine Chance, RD, LDN, [place] Registered Dietitian II Certified Diabetes Care and Education Specialist Please refer to [facility] for RD and/or RD on-call/weekend/after hours pager

## 2020-07-14 NOTE — Progress Notes (Signed)
Occupational Therapy Treatment Patient Details Name: JOHARI BENNETTS MRN: [id number] DOB: [birth year] Today's Date: 07/14/2020    History of present illness 74 year old female with past medical history notable for peripheral vascular disease, hypertension, hyperlipidemia, type 2 diabetes mellitus, CKD, CAD who presented to the ED with shortness of breath.  Found to have sepsis due to left lower extremity cellulitis.   OT comments  Pt progressing gradually with OT goals. Pt motivated to participate and remains very dyspneic during minimal activity. Pt overall Min A for simple transfers with assist for steadying. Pt desats to 84% on 8 L O2 with transfers. Continued reinforcement needed for pursed lip breathing and incentive spirometer use. Pt unable to reach feet at this time for LB ADLs and agreeable to trial AE for these tasks during next session to maximize independence and facility energy conservation. Updated recommendations to SNF for ST rehab d/t reports of daughter with health issues and may not be able to help. Also, based on dyspnea, anticipate pt will have difficulty navigating flight of stairs and ADL mgmt at home.    Follow Up Recommendations  SNF;Supervision/Assistance - 24 hour    Equipment Recommendations  3 in 1 bedside commode;Wheelchair (measurements OT);Wheelchair cushion (measurements OT)    Recommendations for Other Services      Precautions / Restrictions Precautions Precautions: Fall Precaution Comments: monitor O2 sats wtih mobility Restrictions Weight Bearing Restrictions: No       Mobility Bed Mobility Overal bed mobility: Needs Assistance Bed Mobility: Supine to Sit     Supine to sit: Supervision;HOB elevated     General bed mobility comments: Supervision for safety, pt moved quickly to EOB  Transfers Overall transfer level: Needs assistance Equipment used: 1 person hand held assist Transfers: Sit to/from Stand;Stand Pivot Transfers Sit to Stand:  Min guard Stand pivot transfers: Min assist       General transfer comment: min guard for initial sit to stand, MIn A for steadying with dynamic tasks and without DME    Balance Overall balance assessment: Needs assistance Sitting-balance support: Feet supported Sitting balance-Leahy Scale: Good     Standing balance support: Single extremity supported;During functional activity Standing balance-Leahy Scale: Poor Standing balance comment: reliant on reaching for chair armrest                           ADL either performed or assessed with clinical judgement   ADL Overall ADL's : Needs assistance/impaired                     Lower Body Dressing: Maximal assistance;Sitting/lateral leans;Sit to/from stand Lower Body Dressing Details (indicate cue type and reason): Pt reports typically bending to reach feet for socks, Educated on bringing feet to self for improved breathing but pt unable to cross legs. Educated on trial of AE for LB ADLs for energy conservation and increased independence Toilet Transfer: Minimal Scientist, forensic Details (indicate cue type and reason): simulated to recliner, Min A for steadying with handheld assist. RW in front of pt but pt moved passed RW           General ADL Comments: Pt with severely decreased cardiopulmonary tolerance (3/4 DOE with minimal activity), decreased strength, balance, and awareness of deficits     Vision   Vision Assessment?: No apparent visual deficits   Perception     Praxis      Cognition Arousal/Alertness: Awake/alert Behavior During Therapy: WFL for tasks assessed/performed  Overall Cognitive Status: No family/caregiver present to determine baseline cognitive functioning Area of Impairment: Awareness;Safety/judgement                         Safety/Judgement: Decreased awareness of safety;Decreased awareness of deficits Awareness: Emergent   General Comments: Pt with  decreased awareness of safety with lines and need for DME use. Pt with improving awareness, open to rehab when OT observed SOB with minimal activity         Exercises     Shoulder Instructions       General Comments Pt received on 10 L HFNC, RN present and advised to trial on 8 L HFNC based on stable vitals. Pt with desats to 84% for pivot to chair on 8 L O2. Max cues for pursed lip breathing due to mouth breathing. 3/4 DOE throughout activity.    Pertinent Vitals/ Pain       Pain Assessment: No/denies pain  Home Living                                          Prior Functioning/Environment              Frequency  Min 2X/week        Progress Toward Goals  OT Goals(current goals can now be found in the care plan section)  Progress towards OT goals: Progressing toward goals  Acute Rehab OT Goals Patient Stated Goal: to breathe better, open to rehab OT Goal Formulation: With patient Time For Goal Achievement: 07/26/20 Potential to Achieve Goals: Good ADL Goals Pt Will Perform Grooming: with min guard assist;standing Pt Will Perform Lower Body Bathing: with min assist;sit to/from stand;sitting/lateral leans;with adaptive equipment Pt Will Perform Lower Body Dressing: with min assist;sitting/lateral leans;sit to/from stand;with adaptive equipment Pt Will Transfer to Toilet: with min assist;ambulating;regular height toilet Pt Will Perform Tub/Shower Transfer: with min assist;ambulating;shower seat;rolling walker Additional ADL Goal #1: Pt will recall and apply 3-5 ECS strategies to ADL routine Additional ADL Goal #2: Pt will self monitor and maintain SpO2 >88% with use of pursed lip breathing and rest breaks  Plan Discharge plan needs to be updated    Co-evaluation                 AM-PAC OT "6 Clicks" Daily Activity     Outcome Measure   Help from another person eating meals?: A Little Help from another person taking care of personal  grooming?: A Little Help from another person toileting, which includes using toliet, bedpan, or urinal?: A Lot Help from another person bathing (including washing, rinsing, drying)?: A Lot Help from another person to put on and taking off regular upper body clothing?: A Little Help from another person to put on and taking off regular lower body clothing?: A Lot 6 Click Score: 15    End of Session Equipment Utilized During Treatment: Gait belt;Oxygen  OT Visit Diagnosis: Unsteadiness on feet (R26.81);Other abnormalities of gait and mobility (R26.89);Muscle weakness (generalized) (M62.81)   Activity Tolerance Patient tolerated treatment well   Patient Left in chair;with call bell/phone within reach   Nurse Communication Mobility status;Other (comment) (O2)        Time: 7989-2119 OT Time Calculation (min): 21 min  Charges: OT General Charges $OT Visit: 1 Visit OT Treatments $Therapeutic Activity: 8-22 mins  Layla Maw, OTR/L   Almyra Free  Lenoria Narine 07/14/2020, 10:13 AM

## 2020-07-14 NOTE — Progress Notes (Signed)
PROGRESS NOTE    [name]  HQI:[id number] DOB: [birth year] DOA: 07/09/2020 PCP: [name], MD    Brief Narrative:  [name] is a 74 year old female with past medical history notable for peripheral vascular disease, hypertension, hyperlipidemia, type 2 diabetes mellitus, CKD, CAD who presented to the ED with shortness of breath.  Patient has been feeling ill, with weakness, myalgias over the past few days.  She was seen by her PCP, Dr. [name] on 06/26/2020 and provided antibiotics.   In the ED, physical exam notable for left lower extremity cellulitis with multiple skin breaks and ulcerations.  WBC elevated 16.4. TRH was consulted for admission for failed outpatient antibiotic therapy for cellulitis.   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  Recently started on outpatient antibiotics for lower extremity cellulitis without improvement.  On arrival to the ED, patient was noted to have elevated WBC count of 16.4, lactic acid of 2.3, CRP 27.7 with a procalcitonin of 59.95.  Blood cultures x2+ for gram-positive cocci in chains with BCID notable for Streptococcus species.  TTE with LVEF 50-60%, normal LV function, normal RV systolic function, no evidence of valvular vegetations on TTE. --WBC 16.4>15.0>12.8>11.0>13.2>15.5 --PCT 59.95>39.61>24.59>16.36>6.57 --Ceftriaxone> IV PCN>Amoxicillin 500mg  PO BID to complete 7 day course of  antibiotics per ID, now signed off --Supportive care, antipyretics  Acute hypoxic respiratory failure Likely history of COPD, undiagnosed TTE with normal LVEF.  Chest x-ray with mild vascular congestion and basilar atelectasis. D-dimer elevated 2.59.  Vascular duplex ultrasound bilateral lower extremities negative for DVT. --Unable to perform CT angiogram chest 2/2 CKD, check NM VQ scan. 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  Case was discussed with vascular surgery, Dr. Lurline Hare by admitting physician who recommended venous reflux study and placement of Unna boots by wound care if positive, unfortunately per ultrasound technician, only perform that exam outpatient.  Vascular ultrasound ABI demonstrating moderate arterial disease left lower extremity. --Likely will need close follow-up with vascular surgery after resolution of septicemia outpatient --Vascular Navigator following  CKD stage V (baseline Cr 2.6-2.8) --CR 3.11>2.82>2.71>2.54>2.65>2.38 --Renal diet --Calcitriol --sodium bicarb 650mg  BID --Avoid nephrotoxins, renally dose all medications --Follow BMP daily  Type 2 diabetes mellitus Hemoglobin A1c 7.8 on 06/26/2020.  --Lantus 15 units obviously daily --Moderate dose insulin sliding scale for further coverage --CBGs qAC/HS  Hyperlipidemia: Continue atorvastatin 80 mg p.o. daily  PVD/CAD --Continue aspirin 81 mg p.o. daily, Plavix 75 mg p.o. daily  Essential hypertension --Continue amlodipine 10mg  p.o. daily, metoprolol 100mg  BID  Chronic pain syndrome --Continue Zanaflex, Ultram  Morbid obesity BMI 43.8.  Discussed with patient need for  aggressive lifestyle changes/weight loss as this  complicates all facets of care.  Outpatient follow-up with PCP, considerations of bariatric medicine outpatient referral.  DVT prophylaxis: Lovenox Code Status: Full code Family Communication: Updated patient extensively at bedside  Disposition Plan:  Status is: Inpatient  Remains inpatient appropriate because:Ongoing diagnostic testing needed not appropriate for outpatient work up, Unsafe d/c plan, IV treatments appropriate due to intensity of illness or inability to take PO and Inpatient level of care appropriate due to severity of illness continues on 8 L nasal cannula, pending V/Q scan   Dispo: The patient is from: Home              Anticipated d/c is to: SNF              Anticipated d/c date is: 3 days              Patient currently is not medically stable to d/c.   Consultants:   Infectious disease, Dr. Megan Salon  Vascular surgery, Dr. Carlis Abbott - Case was discussed by admitting physician  Procedures:  TTE IMPRESSIONS  1. Left ventricular ejection fraction, by estimation, is 55 to 60%. The  left ventricle has normal function. The left ventricle has no regional  wall motion abnormalities. Left ventricular diastolic function could not  be evaluated.  2. Right ventricular systolic function is normal. The right ventricular  size is normal. There is normal pulmonary artery systolic pressure. The  estimated right ventricular systolic pressure is 73.7 mmHg.  3. The mitral valve is grossly normal. Mild to moderate mitral valve  regurgitation. No evidence of mitral stenosis.  4. The aortic valve is tricuspid. There is mild calcification of the  aortic valve. There is mild thickening of the aortic valve. Aortic valve  regurgitation is not visualized. Mild aortic valve sclerosis is present,  with no evidence of aortic valve  stenosis.  5. The inferior vena cava is normal in size with greater than 50%  respiratory variability,  suggesting right atrial pressure of 3 mmHg.   Vascular ultrasound ABI: Summary:  Right: Resting right ankle-brachial index is within normal range. No  evidence of significant right lower extremity arterial disease. The right  toe-brachial index is abnormal.   Left: Resting left ankle-brachial index indicates moderate left lower  extremity arterial disease. The left toe-brachial index is abnormal.   Antimicrobials:   Ancef 9/16 9 - 9/16  Vancomycin 9/16 - 9/16  Zosyn 9/16 - 9/16  Ceftriaxone 9/17 - 9/19  IV PCN 9/19 - 9/20  Amoxicillin 9/20>>   Subjective: Patient seen and examined bedside, resting comfortably.  Continues to require submental oxygen, now placed on Ventimask overnight following rapid response for concerns of high FiO2 requirements on nasal cannula.  There were unresponsive, patient comfortable, alert in no distress, she was switched from nasal cannula to Ventimask due to mouth breathing.  Awaiting completion of vascular duplex ultrasound lower extremities to rule out DVT.  Unable to perform CT angiogram secondary to renal dysfunction.  If vascular ultrasound negative for DVT, will need to proceed with nuclear medicine VQ scan for PE evaluation. Patient without any other complaints or concerns at this time.  Denies headache, no fever/chills/night sweats, no chest pain, palpitations, no abdominal pain.  No other acute events overnight per nursing staff.  Objective: Vitals:   07/14/20 0438 07/14/20 0942 07/14/20 0956 07/14/20 1109  BP: (!) 151/91  (!) 147/68   Pulse: 83 88    Resp: 20  18   Temp: 97.8 F (36.6 C)  98.3 F (  36.8 C)   TempSrc: Oral  Oral   SpO2:  91%  90%  Weight:        Intake/Output Summary (Last 24 hours) at 07/14/2020 1424 Last data filed at 07/14/2020 0900 Gross per 24 hour  Intake 720 ml  Output --  Net 720 ml   Filed Weights   07/10/20 1531  Weight: 104.8 kg    Examination:  General exam: Appears calm and comfortable,  chronically ill in appearance Respiratory system: Clear to auscultation. Respiratory effort normal.  On 8LNC with SPO2 91% Cardiovascular system: S1 & S2 heard, RRR. No JVD, murmurs, rubs, gallops or clicks. No pedal edema. Gastrointestinal system: Abdomen is nondistended, soft and nontender. No organomegaly or masses felt. Normal bowel sounds heard. Central nervous system: Alert and oriented. No focal neurological deficits. Extremities: Symmetric 5 x 5 power. Skin: Bilateral lower extremities with several areas of skin breaks and ulcerations in various stages of healing, hyperpigmented skin, warm to touch and dorsalis pedis/posterior tibial pulse 2+ Psychiatry: Judgement and insight appear poor. Mood & affect appropriate.     Data Reviewed: I have personally reviewed following labs and imaging studies  CBC: Recent Labs  Lab 07/09/20 1239 07/09/20 1239 07/10/20 0621 07/11/20 0237 07/12/20 0635 07/13/20 0210 07/14/20 0832  WBC 16.4*   < > 15.0* 12.8* 11.0* 13.2* 15.5*  NEUTROABS 14.1*  --   --   --   --   --   --   HGB 10.8*   < > 10.4* 9.4* 10.0* 10.5* 10.7*  HCT 33.3*   < > 31.7* 29.6* 30.8* 32.6* 32.3*  MCV 85.2   < > 84.8 84.6 85.8 85.6 83.7  PLT 257   < > 228 214 270 290 397   < > = values in this interval not displayed.   Basic Metabolic Panel: Recent Labs  Lab 07/10/20 0621 07/11/20 0237 07/12/20 0635 07/13/20 0210 07/14/20 0832  NA 135 138 136 136 138  K 3.2* 3.5 4.2 3.9 3.6  CL 106 105 103 101 102  CO2 22 21* 21* 23 24  GLUCOSE 233* 171* 166* 154* 168*  BUN 46* 39* 42* 44* 43*  CREATININE 2.82* 2.71* 2.54* 2.65* 2.38*  CALCIUM 7.8* 8.0* 8.5* 8.8* 9.3  MG  --  1.6* 2.2 2.2 2.0   GFR: Estimated Creatinine Clearance: 22.4 mL/min (A) (by C-G formula based on SCr of 2.38 mg/dL (H)). Liver Function Tests: Recent Labs  Lab 07/09/20 1239  AST 20  ALT 17  ALKPHOS 93  BILITOT 0.8  PROT 6.2*  ALBUMIN 2.8*   No results for input(s): LIPASE, AMYLASE in the  last 168 hours. No results for input(s): AMMONIA in the last 168 hours. Coagulation Profile: Recent Labs  Lab 07/09/20 1239  INR 1.2   Cardiac Enzymes: No results for input(s): CKTOTAL, CKMB, CKMBINDEX, TROPONINI in the last 168 hours. BNP (last 3 results) No results for input(s): PROBNP in the last 8760 hours. HbA1C: No results for input(s): HGBA1C in the last 72 hours. CBG: Recent Labs  Lab 07/13/20 1121 07/13/20 1614 07/13/20 2030 07/14/20 0622 07/14/20 1157  GLUCAP 239* 116* 169* 134* 176*   Lipid Profile: No results for input(s): CHOL, HDL, LDLCALC, TRIG, CHOLHDL, LDLDIRECT in the last 72 hours. Thyroid Function Tests: No results for input(s): TSH, T4TOTAL, FREET4, T3FREE, THYROIDAB in the last 72 hours. Anemia Panel: No results for input(s): VITAMINB12, FOLATE, FERRITIN, TIBC, IRON, RETICCTPCT in the last 72 hours. Sepsis Labs: Recent Labs  Lab 07/09/20 1239  07/09/20 2005 07/11/20 0237 07/12/20 0635 07/13/20 0210 07/14/20 0832  PROCALCITON  --    < > 39.61 24.59 16.36 6.57  LATICACIDVEN 2.3*  --   --   --   --   --    < > = values in this interval not displayed.    Recent Results (from the past 240 hour(s))  Blood Culture (routine x 2)     Status: Abnormal   Collection Time: 07/09/20 12:40 PM   Specimen: BLOOD RIGHT HAND  Result Value Ref Range Status   Specimen Description BLOOD RIGHT HAND  Final   Special Requests   Final    BOTTLES DRAWN AEROBIC AND ANAEROBIC Blood Culture results may not be optimal due to an inadequate volume of blood received in culture bottles   Culture  Setup Time   Final    AEROBIC BOTTLE ONLY GRAM POSITIVE COCCI IN CHAINS CRITICAL VALUE NOTED.  VALUE IS CONSISTENT WITH PREVIOUSLY REPORTED AND CALLED VALUE. Performed at [facility], [place] [address], [place], [place] [address]    Culture STREPTOCOCCUS GROUP G (A)  Final   Report Status 07/12/2020 FINAL  Final   Organism ID, Bacteria STREPTOCOCCUS GROUP G  Final       Susceptibility   Streptococcus group g - MIC*    CLINDAMYCIN <=0.25 SENSITIVE Sensitive     AMPICILLIN <=0.25 SENSITIVE Sensitive     ERYTHROMYCIN <=0.12 SENSITIVE Sensitive     VANCOMYCIN 0.5 SENSITIVE Sensitive     CEFTRIAXONE <=0.12 SENSITIVE Sensitive     LEVOFLOXACIN 0.5 SENSITIVE Sensitive     PENICILLIN Value in next row Sensitive      SENSITIVEBenzylpenicillin=0.06S    * STREPTOCOCCUS GROUP G  Blood Culture (routine x 2)     Status: Abnormal   Collection Time: 07/09/20 12:45 PM   Specimen: BLOOD LEFT HAND  Result Value Ref Range Status   Specimen Description BLOOD LEFT HAND  Final   Special Requests   Final    BOTTLES DRAWN AEROBIC AND ANAEROBIC Blood Culture adequate volume   Culture  Setup Time   Final    IN BOTH AEROBIC AND ANAEROBIC BOTTLES GRAM POSITIVE COCCI IN CHAINS Organism ID to follow CRITICAL RESULT CALLED TO, READ BACK BY AND VERIFIED WITH: V BRYK 07/10/20 0244 JDW    Culture (A)  Final    STREPTOCOCCUS GROUP G SUSCEPTIBILITIES PERFORMED ON PREVIOUS CULTURE WITHIN THE LAST 5 DAYS. Performed at [facility], [place] [address], [place], [place] [address]    Report Status 07/12/2020 FINAL  Final  Blood Culture ID Panel (Reflexed)     Status: Abnormal   Collection Time: 07/09/20 12:45 PM  Result Value Ref Range Status   Enterococcus faecalis NOT DETECTED NOT DETECTED Final   Enterococcus Faecium NOT DETECTED NOT DETECTED Final   Listeria monocytogenes NOT DETECTED NOT DETECTED Final   Staphylococcus species NOT DETECTED NOT DETECTED Final   Staphylococcus aureus (BCID) NOT DETECTED NOT DETECTED Final   Staphylococcus epidermidis NOT DETECTED NOT DETECTED Final   Staphylococcus lugdunensis NOT DETECTED NOT DETECTED Final   Streptococcus species DETECTED (A) NOT DETECTED Final    Comment: Not Enterococcus species, Streptococcus agalactiae, Streptococcus pyogenes, or Streptococcus pneumoniae. CRITICAL RESULT CALLED TO, READ BACK BY AND VERIFIED WITH: V BRYK  PHARMD 07/10/20 0244 JDW    Streptococcus agalactiae NOT DETECTED NOT DETECTED Final   Streptococcus pneumoniae NOT DETECTED NOT DETECTED Final   Streptococcus pyogenes NOT DETECTED NOT DETECTED Final   A.calcoaceticus-baumannii NOT  DETECTED NOT DETECTED Final   Bacteroides fragilis NOT DETECTED NOT DETECTED Final   Enterobacterales NOT DETECTED NOT DETECTED Final   Enterobacter cloacae complex NOT DETECTED NOT DETECTED Final   Escherichia coli NOT DETECTED NOT DETECTED Final   Klebsiella aerogenes NOT DETECTED NOT DETECTED Final   Klebsiella oxytoca NOT DETECTED NOT DETECTED Final   Klebsiella pneumoniae NOT DETECTED NOT DETECTED Final   Proteus species NOT DETECTED NOT DETECTED Final   Salmonella species NOT DETECTED NOT DETECTED Final   Serratia marcescens NOT DETECTED NOT DETECTED Final   Haemophilus influenzae NOT DETECTED NOT DETECTED Final   Neisseria meningitidis NOT DETECTED NOT DETECTED Final   Pseudomonas aeruginosa NOT DETECTED NOT DETECTED Final   Stenotrophomonas maltophilia NOT DETECTED NOT DETECTED Final   Candida albicans NOT DETECTED NOT DETECTED Final   Candida auris NOT DETECTED NOT DETECTED Final   Candida glabrata NOT DETECTED NOT DETECTED Final   Candida krusei NOT DETECTED NOT DETECTED Final   Candida parapsilosis NOT DETECTED NOT DETECTED Final   Candida tropicalis NOT DETECTED NOT DETECTED Final   Cryptococcus neoformans/gattii NOT DETECTED NOT DETECTED Final    Comment: Performed at [facility], [place] [address], [place], [place] [address]  SARS Coronavirus 2 by RT PCR (hospital order, performed in [facility] hospital lab) Nasopharyngeal Nasopharyngeal Swab     Status: None   Collection Time: 07/14/20  5:33 AM   Specimen: Nasopharyngeal Swab  Result Value Ref Range Status   SARS Coronavirus 2 NEGATIVE NEGATIVE Final    Comment: (NOTE) SARS-CoV-2 target nucleic acids are NOT DETECTED.  The SARS-CoV-2 RNA is generally detectable in upper and  lower respiratory specimens during the acute phase of infection. The lowest concentration of SARS-CoV-2 viral copies this assay can detect is 250 copies / mL. A negative result does not preclude SARS-CoV-2 infection and should not be used as the sole basis for treatment or other patient management decisions.  A negative result may occur with improper specimen collection / handling, submission of specimen other than nasopharyngeal swab, presence of viral mutation(s) within the areas targeted by this assay, and inadequate number of viral copies (<250 copies / mL). A negative result must be combined with clinical observations, patient history, and epidemiological information.  Fact Sheet for Patients:   StrictlyIdeas.no  Fact Sheet for Healthcare Providers: BankingDealers.co.za  This test is not yet approved or  cleared by the [place] FDA and has been authorized for detection and/or diagnosis of SARS-CoV-2 by FDA under an Emergency Use Authorization (EUA).  This EUA will remain in effect (meaning this test can be used) for the duration of the COVID-19 declaration under Section 564(b)(1) of the Act, 21 U.S.C. section 360bbb-3(b)(1), unless the authorization is terminated or revoked sooner.  Performed at [facility], [place] [address], [place], [place] [address]          Radiology Studies: DG CHEST PORT 1 VIEW  Result Date: 07/14/2020 CLINICAL DATA:  Shortness of breath EXAM: PORTABLE CHEST 1 VIEW COMPARISON:  July 11, 2020 FINDINGS: There is airspace opacity in portions of each mid and lower lung region, progressed from most recent study. No consolidation. There is stable cardiomegaly with pulmonary vascularity within normal limits. No adenopathy. There is aortic atherosclerosis. No bone lesions. IMPRESSION: Progression of airspace opacity with airspace opacity currently in each mid and lower lung regions. No  consolidation. Appearance felt to be most consistent with atypical organism pneumonia. Advise correlation with COVID-19 status in this regard.  Stable cardiomegaly.  No adenopathy evident. Aortic Atherosclerosis (ICD10-I70.0). Electronically Signed   By: Lowella Grip III M.D.   On: 07/14/2020 09:19   VAS [place] LOWER EXTREMITY VENOUS (DVT)  Result Date: 07/13/2020  Lower Venous DVTStudy Indications: Edema, and elevated ddimer.  Comparison Study: 07/26/18 previous Performing Technologist: Abram Sander RVS  Examination Guidelines: A complete evaluation includes B-mode imaging, spectral Doppler, color Doppler, and power Doppler as needed of all accessible portions of each vessel. Bilateral testing is considered an integral part of a complete examination. Limited examinations for reoccurring indications may be performed as noted. The reflux portion of the exam is performed with the patient in reverse Trendelenburg.  +---------+---------------+---------+-----------+----------+--------------+ RIGHT    CompressibilityPhasicitySpontaneityPropertiesThrombus Aging +---------+---------------+---------+-----------+----------+--------------+ CFV      Full           Yes      Yes                                 +---------+---------------+---------+-----------+----------+--------------+ SFJ      Full                                                        +---------+---------------+---------+-----------+----------+--------------+ FV Prox  Full                                                        +---------+---------------+---------+-----------+----------+--------------+ FV Mid   Full                                                        +---------+---------------+---------+-----------+----------+--------------+ FV DistalFull                                                        +---------+---------------+---------+-----------+----------+--------------+ PFV      Full                                                         +---------+---------------+---------+-----------+----------+--------------+ POP      Full           Yes      Yes                                 +---------+---------------+---------+-----------+----------+--------------+ PTV      Full                                                        +---------+---------------+---------+-----------+----------+--------------+  PERO                                                  Not visualized +---------+---------------+---------+-----------+----------+--------------+   +---------+---------------+---------+-----------+----------+--------------+ LEFT     CompressibilityPhasicitySpontaneityPropertiesThrombus Aging +---------+---------------+---------+-----------+----------+--------------+ CFV      Full           Yes      Yes                                 +---------+---------------+---------+-----------+----------+--------------+ SFJ      Full                                                        +---------+---------------+---------+-----------+----------+--------------+ FV Prox  Full                                                        +---------+---------------+---------+-----------+----------+--------------+ FV Mid                  Yes      Yes                                 +---------+---------------+---------+-----------+----------+--------------+ FV Distal               Yes      Yes                                 +---------+---------------+---------+-----------+----------+--------------+ PFV      Full                                                        +---------+---------------+---------+-----------+----------+--------------+ POP      Full                                                        +---------+---------------+---------+-----------+----------+--------------+ PTV                                                   Not visualized  +---------+---------------+---------+-----------+----------+--------------+ PERO                                                  Not visualized +---------+---------------+---------+-----------+----------+--------------+     Summary: BILATERAL: -  No evidence of deep vein thrombosis seen in the lower extremities, bilaterally. - No evidence of superficial venous thrombosis in the lower extremities, bilaterally. - RIGHT: - A cystic structure is found in the popliteal fossa.  LEFT: - No cystic structure found in the popliteal fossa.  *See table(s) above for measurements and observations. Electronically signed by Ruta Hinds MD on 07/13/2020 at 5:15:24 PM.    Final         Scheduled Meds: . (feeding supplement) PROSource Plus  30 mL Oral BID BM  . allopurinol  100 mg Oral Daily  . amLODipine  10 mg Oral Daily  . amoxicillin  500 mg Oral Q12H  . aspirin EC  81 mg Oral Daily  . atorvastatin  80 mg Oral QPM  . calcitRIOL  0.5 mcg Oral Daily  . clopidogrel  75 mg Oral Daily  . enoxaparin (LOVENOX) injection  30 mg Subcutaneous Q24H  . gabapentin  100 mg Oral TID  . influenza vaccine adjuvanted  0.5 mL Intramuscular Tomorrow-1000  . insulin aspart  0-15 Units Subcutaneous TID WC  . insulin aspart  0-5 Units Subcutaneous QHS  . insulin glargine  15 Units Subcutaneous Daily  . levalbuterol  0.63 mg Nebulization Once  . metoprolol tartrate  100 mg Oral BID  . mirabegron ER  25 mg Oral Daily  . multivitamin with minerals  1 tablet Oral Daily  . pantoprazole  40 mg Oral Daily  . sodium bicarbonate  650 mg Oral BID  . sodium chloride flush  3 mL Intravenous Q12H  . timolol  1 drop Both Eyes Daily   Continuous Infusions: . lactated ringers 100 mL/hr at 07/14/20 0455     LOS: 5 days    Time spent: 39 minutes spent on chart review, discussion with nursing staff, consultants, updating family and interview/physical exam; more than 50% of that time was spent in counseling and/or coordination  of care.    Zahi Plaskett J [place] DO [facility] Available via Epic secure chat 7am-7pm After these hours, please refer to coverage provider listed on [email or web address] 07/14/2020, 2:24 PM

## 2020-07-14 NOTE — Plan of Care (Signed)

## 2020-07-15 DIAGNOSIS — L03116 Cellulitis of left lower limb: Secondary | ICD-10-CM

## 2020-07-15 DIAGNOSIS — E118 Type 2 diabetes mellitus with unspecified complications: Secondary | ICD-10-CM

## 2020-07-15 DIAGNOSIS — N184 Chronic kidney disease, stage 4 (severe): Secondary | ICD-10-CM

## 2020-07-15 LAB — BASIC METABOLIC PANEL
Anion gap: 14 (ref 5–15)
BUN: 41 mg/dL — ABNORMAL HIGH (ref 8–23)
CO2: 22 mmol/L (ref 22–32)
Calcium: 9.2 mg/dL (ref 8.9–10.3)
Chloride: 102 mmol/L (ref 98–111)
Creatinine, Ser: 2.36 mg/dL — ABNORMAL HIGH (ref 0.44–1.00)
GFR calc Af Amer: 22 mL/min — ABNORMAL LOW (ref >60)
GFR calc non Af Amer: 19 mL/min — ABNORMAL LOW (ref >60)
Glucose, Bld: 157 mg/dL — ABNORMAL HIGH (ref 70–99)
Potassium: 3.5 mmol/L (ref 3.5–5.1)
Sodium: 138 mmol/L (ref 135–145)

## 2020-07-15 LAB — CBC WITH DIFFERENTIAL/PLATELET
Abs Immature Granulocytes: 1.1 10*3/uL — ABNORMAL HIGH (ref 0.00–0.07)
Basophils Absolute: 0.2 10*3/uL — ABNORMAL HIGH (ref 0.0–0.1)
Basophils Relative: 1 %
Eosinophils Absolute: 0.5 10*3/uL (ref 0.0–0.5)
Eosinophils Relative: 3 %
HCT: 31.7 % — ABNORMAL LOW (ref 36.0–46.0)
Hemoglobin: 10.4 g/dL — ABNORMAL LOW (ref 12.0–15.0)
Immature Granulocytes: 7 %
Lymphocytes Relative: 14 %
Lymphs Abs: 2.3 10*3/uL (ref 0.7–4.0)
MCH: 26.9 pg (ref 26.0–34.0)
MCHC: 32.8 g/dL (ref 30.0–36.0)
MCV: 82.1 fL (ref 80.0–100.0)
Monocytes Absolute: 0.6 10*3/uL (ref 0.1–1.0)
Monocytes Relative: 4 %
Neutro Abs: 11.4 10*3/uL — ABNORMAL HIGH (ref 1.7–7.7)
Neutrophils Relative %: 71 %
Platelets: 483 10*3/uL — ABNORMAL HIGH (ref 150–400)
RBC: 3.86 MIL/uL — ABNORMAL LOW (ref 3.87–5.11)
RDW: 14.3 % (ref 11.5–15.5)
WBC: 16 10*3/uL — ABNORMAL HIGH (ref 4.0–10.5)
nRBC: 1.4 % — ABNORMAL HIGH (ref 0.0–0.2)

## 2020-07-15 LAB — GLUCOSE, CAPILLARY
Glucose-Capillary: 109 mg/dL — ABNORMAL HIGH (ref 70–99)
Glucose-Capillary: 157 mg/dL — ABNORMAL HIGH (ref 70–99)
Glucose-Capillary: 307 mg/dL — ABNORMAL HIGH (ref 70–99)
Glucose-Capillary: 128 mg/dL — ABNORMAL HIGH (ref 70–99)

## 2020-07-15 LAB — PROCALCITONIN: Procalcitonin: 3.8 ng/mL

## 2020-07-15 LAB — MAGNESIUM: Magnesium: 2 mg/dL (ref 1.7–2.4)

## 2020-07-15 MED ORDER — METHYLPREDNISOLONE SODIUM SUCC 40 MG IJ SOLR
40.0000 mg | Freq: Two times a day (BID) | INTRAMUSCULAR | Status: DC
  Administered 2020-07-15 – 2020-07-19 (×8): 40 mg via INTRAVENOUS
  Filled 2020-07-15 (×8): qty 1

## 2020-07-15 MED ORDER — INSULIN GLARGINE 100 UNIT/ML ~~LOC~~ SOLN: 25.0000 [IU] | Freq: Every day | SUBCUTANEOUS | Status: DC

## 2020-07-15 MED ORDER — INSULIN ASPART 100 UNIT/ML ~~LOC~~ SOLN
0.0000 [IU] | Freq: Every day | SUBCUTANEOUS | Status: DC
  Administered 2020-07-16 – 2020-07-17 (×2): 3 [IU] via SUBCUTANEOUS
  Administered 2020-07-18: 2 [IU] via SUBCUTANEOUS
  Administered 2020-07-20: 4 [IU] via SUBCUTANEOUS

## 2020-07-15 MED ORDER — INSULIN ASPART 100 UNIT/ML ~~LOC~~ SOLN
0.0000 [IU] | Freq: Three times a day (TID) | SUBCUTANEOUS | Status: DC
  Administered 2020-07-16: 4 [IU] via SUBCUTANEOUS
  Administered 2020-07-16: 7 [IU] via SUBCUTANEOUS
  Administered 2020-07-17 (×2): 11 [IU] via SUBCUTANEOUS
  Administered 2020-07-17: 7 [IU] via SUBCUTANEOUS
  Administered 2020-07-18: 11 [IU] via SUBCUTANEOUS
  Administered 2020-07-18 – 2020-07-19 (×3): 7 [IU] via SUBCUTANEOUS
  Administered 2020-07-19: 11 [IU] via SUBCUTANEOUS
  Administered 2020-07-19 – 2020-07-20 (×3): 7 [IU] via SUBCUTANEOUS
  Administered 2020-07-20: 4 [IU] via SUBCUTANEOUS
  Administered 2020-07-21: 7 [IU] via SUBCUTANEOUS
  Administered 2020-07-21: 3 [IU] via SUBCUTANEOUS

## 2020-07-15 MED ORDER — INSULIN GLARGINE 100 UNIT/ML ~~LOC~~ SOLN
10.0000 [IU] | Freq: Once | SUBCUTANEOUS | Status: AC
  Administered 2020-07-15: 10 [IU] via SUBCUTANEOUS
  Filled 2020-07-15: qty 0.1

## 2020-07-15 MED ORDER — INSULIN ASPART 100 UNIT/ML ~~LOC~~ SOLN
8.0000 [IU] | Freq: Three times a day (TID) | SUBCUTANEOUS | Status: DC
  Administered 2020-07-16 – 2020-07-21 (×16): 8 [IU] via SUBCUTANEOUS

## 2020-07-15 MED ORDER — INSULIN GLARGINE 100 UNIT/ML ~~LOC~~ SOLN
30.0000 [IU] | Freq: Every day | SUBCUTANEOUS | Status: DC
  Administered 2020-07-16 – 2020-07-21 (×6): 30 [IU] via SUBCUTANEOUS
  Filled 2020-07-15 (×6): qty 0.3

## 2020-07-15 NOTE — Plan of Care (Signed)
  Problem: Education: Goal: Knowledge of General Education information will improve Description: Including pain rating scale, medication(s)/side effects and non-pharmacologic comfort measures Outcome: Progressing   Problem: Health Behavior/Discharge Planning: Goal: Ability to manage health-related needs will improve Outcome: Progressing   Problem: Activity: Goal: Risk for activity intolerance will decrease Outcome: Progressing   Problem: Elimination: Goal: Will not experience complications related to bowel motility Outcome: Progressing   Problem: Pain Managment: Goal: General experience of comfort will improve Outcome: Progressing   Problem: Safety: Goal: Ability to remain free from injury will improve Outcome: Progressing   Problem: Skin Integrity: Goal: Risk for impaired skin integrity will decrease Outcome: Progressing   

## 2020-07-15 NOTE — Progress Notes (Signed)
Physical Therapy Treatment Patient Details Name: [name] MRN: [id number] DOB: 05-Mar-1946 Today's Date: 07/15/2020    History of Present Illness 74 year old female with past medical history notable for peripheral vascular disease, hypertension, hyperlipidemia, type 2 diabetes mellitus, CKD, CAD who presented to the ED with shortness of breath.  Found to have sepsis due to left lower extremity cellulitis.    PT Comments    Pt progressing slowly towards her physical therapy goals; decreased cardiopulmonary endurance remains a limitation. Pt ambulating from the bed to chair with a walker and min guard assist. Desaturation to 85% 8L O2 on HFNC; increased to 9L O2 with subsequent slow rebound to 90%. Continued reinforcement provided on use of IS. Due to slow progress and decreased activity tolerance, updated d/c plan to SNF. Pt lives on the second floor of her house and has a flight of steps to negotiate. Pt stating she is agreeable.     Follow Up Recommendations  SNF     Equipment Recommendations  Rolling walker with 5" wheels;3in1 (PT);Wheelchair (measurements PT);Wheelchair cushion (measurements PT)    Recommendations for Other Services       Precautions / Restrictions Precautions Precautions: Fall;Other (comment) Precaution Comments: watch O2 Restrictions Weight Bearing Restrictions: No    Mobility  Bed Mobility Overal bed mobility: Needs Assistance Bed Mobility: Supine to Sit     Supine to sit: Supervision;HOB elevated     General bed mobility comments: Supervision for safety, pt moved quickly to EOB  Transfers Overall transfer level: Needs assistance Equipment used: Rolling walker (2 wheeled) Transfers: Sit to/from [facility] Sit to Stand: Min guard Stand pivot transfers: Min guard       General transfer comment: Min guard for safety with transition from bed to chair  Ambulation/Gait                 Stairs              Wheelchair Mobility    Modified Rankin (Stroke Patients Only)       Balance Overall balance assessment: Needs assistance Sitting-balance support: Feet supported Sitting balance-Leahy Scale: Good     Standing balance support: Single extremity supported;During functional activity Standing balance-Leahy Scale: Poor Standing balance comment: reliant on reaching for chair armrest                            Cognition Arousal/Alertness: Awake/alert Behavior During Therapy: WFL for tasks assessed/performed Overall Cognitive Status: No family/caregiver present to determine baseline cognitive functioning Area of Impairment: Awareness;Safety/judgement                         Safety/Judgement: Decreased awareness of safety;Decreased awareness of deficits Awareness: Emergent          Exercises General Exercises - Lower Extremity Ankle Circles/Pumps: Both;15 reps;Seated Long Arc Quad: Both;5 reps;Seated Other Exercises Other Exercises: IS use x 10 (~750)    General Comments        Pertinent Vitals/Pain Pain Assessment: Faces Faces Pain Scale: Hurts little more Pain Location: L LE with movement Pain Descriptors / Indicators: Grimacing;Guarding;Discomfort Pain Intervention(s): Monitored during session;Limited activity within patient's tolerance    Home Living                      Prior Function            PT Goals (current goals can now be found in  the care plan section) Acute Rehab PT Goals Patient Stated Goal: to breathe better, open to rehab Potential to Achieve Goals: Good    Frequency    Min 3X/week      PT Plan Discharge plan needs to be updated    Co-evaluation              AM-PAC PT "6 Clicks" Mobility   Outcome Measure  Help needed turning from your back to your side while in a flat bed without using bedrails?: None Help needed moving from lying on your back to sitting on the side of a flat bed without  using bedrails?: None Help needed moving to and from a bed to a chair (including a wheelchair)?: A Little Help needed standing up from a chair using your arms (e.g., wheelchair or bedside chair)?: A Little Help needed to walk in hospital room?: A Little Help needed climbing 3-5 steps with a railing? : A Lot 6 Click Score: 19    End of Session Equipment Utilized During Treatment: Oxygen Activity Tolerance: Patient limited by fatigue Patient left: in chair;with call bell/phone within reach;with chair alarm set Nurse Communication: Mobility status PT Visit Diagnosis: Other abnormalities of gait and mobility (R26.89);Muscle weakness (generalized) (M62.81);Pain Pain - Right/Left: Left Pain - part of body: Leg     Time: 1501-1520 PT Time Calculation (min) (ACUTE ONLY): 19 min  Charges:  $Therapeutic Activity: 8-22 mins $Neuromuscular Re-education: 8-22 mins                       Wyona Almas, PT, DPT Acute Rehabilitation Services Pager [phone] Office [phone]    Deno Etienne 07/15/2020, 5:17 PM

## 2020-07-15 NOTE — Progress Notes (Signed)
PROGRESS NOTE    [name]  JHE:[id number] DOB: 09/30/1946 DOA: 07/09/2020 PCP: [name], MD    Brief Narrative:  74 year old female with past medical history notable for peripheral vascular disease, hypertension, hyperlipidemia, type 2 diabetes mellitus, CKD, CAD who presented to the ED with shortness of breath.  Patient has been feeling ill, with weakness, myalgias over the past few days.  She was seen by her PCP, Dr. [name] on 06/26/2020 and provided antibiotics.   In the ED, physical exam notable for left lower extremity cellulitis with multiple skin breaks and ulcerations.  WBC elevated 16.4. TRH was consulted for admission for failed outpatient antibiotic therapy for cellulitis.  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  Recently started on outpatient antibiotics for lower extremity cellulitis without improvement.  On arrival to the ED, patient was noted to have elevated WBC count of 16.4, lactic acid of 2.3, CRP 27.7 with a procalcitonin of 59.95.  Blood cultures x2+ for gram-positive cocci in chains with BCID notable for Streptococcus species.  TTE with LVEF 50-60%, normal LV function, normal RV systolic function, no evidence of valvular vegetations on TTE. --Ceftriaxone> IV PCN>Amoxicillin 500mg  PO BID to complete 7 day course of antibiotics per ID, now signed off --Supportive care, antipyretics as needed --Procalcitonin had  been trending down  Acute hypoxic respiratory failure Likely history of COPD, undiagnosed TTE with normal LVEF.  Chest x-ray with mild vascular congestion and basilar atelectasis. D-dimer elevated 2.59.  Vascular duplex ultrasound bilateral lower extremities negative for DVT. --Unable to perform CT angiogram chest 2/2 CKD, check NM VQ scan. --VQ unremarkable for acute PE --Wheezing on exam with decreased BS in the setting of past tobacco abuse x 59yrs. 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  Case was discussed with vascular surgery, Dr. Lurline Hare by admitting physician who recommended venous reflux study and placement of Unna boots by wound care if positive, unfortunately per ultrasound technician, only perform that exam outpatient.  Vascular ultrasound ABI demonstrating moderate arterial disease left lower extremity. --Likely will need close follow-up with vascular surgery after resolution of septicemia outpatient --Vascular Navigator following  CKD stage V (baseline Cr 2.6-2.8) --Renal diet --Calcitriol --sodium bicarb 650mg  BID --Avoid nephrotoxins, renally dose all medications --repeat bmet in AM  Type 2 diabetes mellitus Hemoglobin A1c 7.8 on 06/26/2020.  -See above, as high dosed steroids are ordered, will increase lantus to 30 units daily with addition of 8 units meal coverage -Have increased SSI to resistant scale  Hyperlipidemia: Continue atorvastatin 80 mg p.o. daily as tolerated  PVD/CAD --Continue aspirin 81 mg p.o. daily, Plavix 75 mg p.o. daily as pt tolerates  Essential  hypertension --Continue with amlodipine 10mg  p.o. daily, metoprolol 100mg   BID  Chronic pain syndrome --Continue Zanaflex, Ultram  Morbid obesity BMI 43.8.   -Recommend diet/lifestyle modification -Outpatient follow-up with PCP, considerations of bariatric medicine outpatient referral.  DVT prophylaxis: Lovenox subq Code Status: Full Family Communication: Pt in room, family not at bedside  Status is: Inpatient  Remains inpatient appropriate because:Hemodynamically unstable, Unsafe d/c plan, IV treatments appropriate due to intensity of illness or inability to take PO and Inpatient level of care appropriate due to severity of illness   Dispo:  Patient From: Home  Planned Disposition: [place]  Expected discharge date: 07/16/20  Medically stable for discharge: No        Consultants:   ID  Vascular Surgery  Procedures:  TTE IMPRESSIONS  1. Left ventricular ejection fraction, by estimation, is 55 to 60%. The  left ventricle has normal function. The left ventricle has no regional  wall motion abnormalities. Left ventricular diastolic function could not  be evaluated.  2. Right ventricular systolic function is normal. The right ventricular  size is normal. There is normal pulmonary artery systolic pressure. The  estimated right ventricular systolic pressure is 10.6 mmHg.  3. The mitral valve is grossly normal. Mild to moderate mitral valve  regurgitation. No evidence of mitral stenosis.  4. The aortic valve is tricuspid. There is mild calcification of the  aortic valve. There is mild thickening of the aortic valve. Aortic valve  regurgitation is not visualized. Mild aortic valve sclerosis is present,  with no evidence of aortic valve  stenosis.  5. The inferior vena cava is normal in size with greater than 50%  respiratory variability, suggesting right atrial pressure of 3 mmHg.   Vascular ultrasound ABI: Summary:  Right: Resting right ankle-brachial index is within normal range. No  evidence of significant right  lower extremity arterial disease. The right  toe-brachial index is abnormal.   Left: Resting left ankle-brachial index indicates moderate left lower  extremity arterial disease. The left toe-brachial index is abnormal.    Antimicrobials: Anti-infectives (From admission, onward)   Start     Dose/Rate Route Frequency Ordered Stop   07/13/20 1400  amoxicillin (AMOXIL) capsule 500 mg        500 mg Oral Every 12 hours 07/13/20 1121 07/14/20 2234   07/12/20 1800  penicillin G potassium 6 Million Units in dextrose 5 % 250 mL continuous infusion  Status:  Discontinued        6 Million Units 20.8 mL/hr over 12 Hours Intravenous Every 12 hours 07/12/20 1332 07/13/20 1121   07/11/20 1900  vancomycin (VANCOREADY) IVPB 1500 mg/300 mL  Status:  Discontinued        1,500 mg 150 mL/hr over 120 Minutes Intravenous Every 48 hours 07/09/20 1815 07/10/20 0301   07/10/20 1800  cefTRIAXone (ROCEPHIN) 2 g in sodium chloride 0.9 % 100 mL IVPB  Status:  Discontinued        2 g 200 mL/hr over 30 Minutes Intravenous Every 24 hours 07/10/20 0301 07/12/20 1332   07/10/20 0000  piperacillin-tazobactam (ZOSYN) IVPB 2.25 g  Status:  Discontinued        2.25 g 100 mL/hr over 30 Minutes Intravenous Every 6 hours 07/09/20 1815 07/10/20 0301   07/09/20 1830  vancomycin (VANCOREADY) IVPB 2000 mg/400 mL        2,000 mg 200 mL/hr over 120 Minutes Intravenous  Once 07/09/20 1752 07/09/20 2143   07/09/20 1800  piperacillin-tazobactam (ZOSYN) IVPB 3.375  g        3.375 g 100 mL/hr over 30 Minutes Intravenous  Once 07/09/20 1752 07/09/20 1926   07/09/20 1230  ceFAZolin (ANCEF) IVPB 2g/100 mL premix        2 g 200 mL/hr over 30 Minutes Intravenous  Once 07/09/20 1216 07/09/20 1315   07/09/20 1200  ceFAZolin (ANCEF) IVPB 1 g/50 mL premix  Status:  Discontinued        1 g 100 mL/hr over 30 Minutes Intravenous  Once 07/09/20 1156 07/09/20 1216       Subjective: Complaining of sob  Objective: Vitals:   07/15/20 0300  07/15/20 0749 07/15/20 0755 07/15/20 1300  BP: (!) 150/73 (!) 176/82  (!) 145/80  Pulse: 86 98  92  Resp: 19 15  16   Temp: 97.7 F (36.5 C) 98.3 F (36.8 C)  97.9 F (36.6 C)  TempSrc: Oral   Oral  SpO2: 92% (!) 89% 90% 91%  Weight:        Intake/Output Summary (Last 24 hours) at 07/15/2020 1737 Last data filed at 07/15/2020 1300 Gross per 24 hour  Intake 600 ml  Output 300 ml  Net 300 ml   Filed Weights   07/10/20 1531  Weight: 104.8 kg    Examination:  General exam: Appears calm and comfortable  Respiratory system: increased resp effort with end-expiratory wheezing, decreased BS throughout Cardiovascular system: S1 & S2 heard, regular Gastrointestinal system: Abdomen is nondistended, soft and nontender. No organomegaly or masses felt. Normal bowel sounds heard. Central nervous system: Alert and oriented. No focal neurological deficits. Extremities: Symmetric 5 x 5 power. Skin: No rashes, lesions Psychiatry: Judgement and insight appear normal. Mood & affect appropriate.   Data Reviewed: I have personally reviewed following labs and imaging studies  CBC: Recent Labs  Lab 07/09/20 1239 07/10/20 0621 07/11/20 0237 07/12/20 0635 07/13/20 0210 07/14/20 0832 07/15/20 1006  WBC 16.4*   < > 12.8* 11.0* 13.2* 15.5* 16.0*  NEUTROABS 14.1*  --   --   --   --   --  11.4*  HGB 10.8*   < > 9.4* 10.0* 10.5* 10.7* 10.4*  HCT 33.3*   < > 29.6* 30.8* 32.6* 32.3* 31.7*  MCV 85.2   < > 84.6 85.8 85.6 83.7 82.1  PLT 257   < > 214 270 290 397 483*   < > = values in this interval not displayed.   Basic Metabolic Panel: Recent Labs  Lab 07/11/20 0237 07/12/20 0635 07/13/20 0210 07/14/20 0832 07/15/20 1006  NA 138 136 136 138 138  K 3.5 4.2 3.9 3.6 3.5  CL 105 103 101 102 102  CO2 21* 21* 23 24 22   GLUCOSE 171* 166* 154* 168* 157*  BUN 39* 42* 44* 43* 41*  CREATININE 2.71* 2.54* 2.65* 2.38* 2.36*  CALCIUM 8.0* 8.5* 8.8* 9.3 9.2  MG 1.6* 2.2 2.2 2.0 2.0    GFR: Estimated Creatinine Clearance: 22.6 mL/min (A) (by C-G formula based on SCr of 2.36 mg/dL (H)). Liver Function Tests: Recent Labs  Lab 07/09/20 1239  AST 20  ALT 17  ALKPHOS 93  BILITOT 0.8  PROT 6.2*  ALBUMIN 2.8*   No results for input(s): LIPASE, AMYLASE in the last 168 hours. No results for input(s): AMMONIA in the last 168 hours. Coagulation Profile: Recent Labs  Lab 07/09/20 1239  INR 1.2   Cardiac Enzymes: No results for input(s): CKTOTAL, CKMB, CKMBINDEX, TROPONINI in the last 168 hours. BNP (last 3 results) No  results for input(s): PROBNP in the last 8760 hours. HbA1C: No results for input(s): HGBA1C in the last 72 hours. CBG: Recent Labs  Lab 07/14/20 1651 07/14/20 2243 07/15/20 0612 07/15/20 1225 07/15/20 1609  GLUCAP 174* 151* 109* 157* 307*   Lipid Profile: No results for input(s): CHOL, HDL, LDLCALC, TRIG, CHOLHDL, LDLDIRECT in the last 72 hours. Thyroid Function Tests: No results for input(s): TSH, T4TOTAL, FREET4, T3FREE, THYROIDAB in the last 72 hours. Anemia Panel: No results for input(s): VITAMINB12, FOLATE, FERRITIN, TIBC, IRON, RETICCTPCT in the last 72 hours. Sepsis Labs: Recent Labs  Lab 07/09/20 1239 07/09/20 2005 07/12/20 0635 07/13/20 0210 07/14/20 0832 07/15/20 1006  PROCALCITON  --    < > 24.59 16.36 6.57 3.80  LATICACIDVEN 2.3*  --   --   --   --   --    < > = values in this interval not displayed.    Recent Results (from the past 240 hour(s))  Blood Culture (routine x 2)     Status: Abnormal   Collection Time: 07/09/20 12:40 PM   Specimen: BLOOD RIGHT HAND  Result Value Ref Range Status   Specimen Description BLOOD RIGHT HAND  Final   Special Requests   Final    BOTTLES DRAWN AEROBIC AND ANAEROBIC Blood Culture results may not be optimal due to an inadequate volume of blood received in culture bottles   Culture  Setup Time   Final    AEROBIC BOTTLE ONLY GRAM POSITIVE COCCI IN CHAINS CRITICAL VALUE NOTED.  VALUE  IS CONSISTENT WITH PREVIOUSLY REPORTED AND CALLED VALUE. Performed at [facility], [place] [address] [place], [place] [address]    Culture STREPTOCOCCUS GROUP G (A)  Final   Report Status 07/12/2020 FINAL  Final   Organism ID, Bacteria STREPTOCOCCUS GROUP G  Final      Susceptibility   Streptococcus group g - MIC*    CLINDAMYCIN <=0.25 SENSITIVE Sensitive     AMPICILLIN <=0.25 SENSITIVE Sensitive     ERYTHROMYCIN <=0.12 SENSITIVE Sensitive     VANCOMYCIN 0.5 SENSITIVE Sensitive     CEFTRIAXONE <=0.12 SENSITIVE Sensitive     LEVOFLOXACIN 0.5 SENSITIVE Sensitive     PENICILLIN Value in next row Sensitive      SENSITIVEBenzylpenicillin=0.06S    * STREPTOCOCCUS GROUP G  Blood Culture (routine x 2)     Status: Abnormal   Collection Time: 07/09/20 12:45 PM   Specimen: BLOOD LEFT HAND  Result Value Ref Range Status   Specimen Description BLOOD LEFT HAND  Final   Special Requests   Final    BOTTLES DRAWN AEROBIC AND ANAEROBIC Blood Culture adequate volume   Culture  Setup Time   Final    IN BOTH AEROBIC AND ANAEROBIC BOTTLES GRAM POSITIVE COCCI IN CHAINS Organism ID to follow CRITICAL RESULT CALLED TO, READ BACK BY AND VERIFIED WITH: V BRYK 07/10/20 0244 JDW    Culture (A)  Final    STREPTOCOCCUS GROUP G SUSCEPTIBILITIES PERFORMED ON PREVIOUS CULTURE WITHIN THE LAST 5 DAYS. Performed at [facility], [place] [address], [place],  [address]    Report Status 07/12/2020 FINAL  Final  Blood Culture ID Panel (Reflexed)     Status: Abnormal   Collection Time: 07/09/20 12:45 PM  Result Value Ref Range Status   Enterococcus faecalis NOT DETECTED NOT DETECTED Final   Enterococcus Faecium NOT DETECTED NOT DETECTED Final   Listeria monocytogenes NOT DETECTED NOT DETECTED Final   Staphylococcus species NOT DETECTED  NOT DETECTED Final   Staphylococcus aureus (BCID) NOT DETECTED NOT DETECTED Final   Staphylococcus epidermidis NOT DETECTED NOT DETECTED Final   Staphylococcus  lugdunensis NOT DETECTED NOT DETECTED Final   Streptococcus species DETECTED (A) NOT DETECTED Final    Comment: Not Enterococcus species, Streptococcus agalactiae, Streptococcus pyogenes, or Streptococcus pneumoniae. CRITICAL RESULT CALLED TO, READ BACK BY AND VERIFIED WITH: V BRYK PHARMD 07/10/20 0244 JDW    Streptococcus agalactiae NOT DETECTED NOT DETECTED Final   Streptococcus pneumoniae NOT DETECTED NOT DETECTED Final   Streptococcus pyogenes NOT DETECTED NOT DETECTED Final   A.calcoaceticus-baumannii NOT DETECTED NOT DETECTED Final   Bacteroides fragilis NOT DETECTED NOT DETECTED Final   Enterobacterales NOT DETECTED NOT DETECTED Final   Enterobacter cloacae complex NOT DETECTED NOT DETECTED Final   Escherichia coli NOT DETECTED NOT DETECTED Final   Klebsiella aerogenes NOT DETECTED NOT DETECTED Final   Klebsiella oxytoca NOT DETECTED NOT DETECTED Final   Klebsiella pneumoniae NOT DETECTED NOT DETECTED Final   Proteus species NOT DETECTED NOT DETECTED Final   Salmonella species NOT DETECTED NOT DETECTED Final   Serratia marcescens NOT DETECTED NOT DETECTED Final   Haemophilus influenzae NOT DETECTED NOT DETECTED Final   Neisseria meningitidis NOT DETECTED NOT DETECTED Final   Pseudomonas aeruginosa NOT DETECTED NOT DETECTED Final   Stenotrophomonas maltophilia NOT DETECTED NOT DETECTED Final   Candida albicans NOT DETECTED NOT DETECTED Final   Candida auris NOT DETECTED NOT DETECTED Final   Candida glabrata NOT DETECTED NOT DETECTED Final   Candida krusei NOT DETECTED NOT DETECTED Final   Candida parapsilosis NOT DETECTED NOT DETECTED Final   Candida tropicalis NOT DETECTED NOT DETECTED Final   Cryptococcus neoformans/gattii NOT DETECTED NOT DETECTED Final    Comment: Performed at [facility], [address], [place], [place] [address]  SARS Coronavirus 2 by RT PCR (hospital order, performed in [facility] hospital lab) Nasopharyngeal Nasopharyngeal Swab     Status: None     Collection Time: 07/14/20  5:33 AM   Specimen: Nasopharyngeal Swab  Result Value Ref Range Status   SARS Coronavirus 2 NEGATIVE NEGATIVE Final    Comment: (NOTE) SARS-CoV-2 target nucleic acids are NOT DETECTED.  The SARS-CoV-2 RNA is generally detectable in upper and lower respiratory specimens during the acute phase of infection. The lowest concentration of SARS-CoV-2 viral copies this assay can detect is 250 copies / mL. A negative result does not preclude SARS-CoV-2 infection and should not be used as the sole basis for treatment or other patient management decisions.  A negative result may occur with improper specimen collection / handling, submission of specimen other than nasopharyngeal swab, presence of viral mutation(s) within the areas targeted by this assay, and inadequate number of viral copies (<250 copies / mL). A negative result must be combined with clinical observations, patient history, and epidemiological information.  Fact Sheet for Patients:   StrictlyIdeas.no  Fact Sheet for Healthcare Providers: BankingDealers.co.za  This test is not yet approved or  cleared by the [place] FDA and has been authorized for detection and/or diagnosis of SARS-CoV-2 by FDA under an Emergency Use Authorization (EUA).  This EUA will remain in effect (meaning this test can be used) for the duration of the COVID-19 declaration under Section 564(b)(1) of the Act, 21 U.S.C. section 360bbb-3(b)(1), unless the authorization is terminated or revoked sooner.  Performed at [facility], [place] [address], [place],  [address]      Radiology Studies: NM Pulmonary  Perfusion  Result Date: 07/14/2020 CLINICAL DATA:  Positive D-dimer study.  Shortness of breath EXAM: NUCLEAR MEDICINE PERFUSION LUNG SCAN TECHNIQUE: Perfusion images were obtained in multiple projections after intravenous injection of radiopharmaceutical. Views:  Anterior, posterior, left lateral, right lateral, RPO, LPO, RAO, LAO RADIOPHARMACEUTICALS:  3.9 mCi Tc-37m MAA IV COMPARISON:  Chest radiograph November 23, 2017 FINDINGS: Radiotracer uptake is homogeneous and symmetric bilaterally. No perfusion defects evident. IMPRESSION: No evident perfusion defects. No findings indicative of pulmonary embolus. Electronically Signed   By: Lowella Grip III M.D.   On: 07/14/2020 14:42   DG CHEST PORT 1 VIEW  Result Date: 07/14/2020 CLINICAL DATA:  Shortness of breath EXAM: PORTABLE CHEST 1 VIEW COMPARISON:  July 11, 2020 FINDINGS: There is airspace opacity in portions of each mid and lower lung region, progressed from most recent study. No consolidation. There is stable cardiomegaly with pulmonary vascularity within normal limits. No adenopathy. There is aortic atherosclerosis. No bone lesions. IMPRESSION: Progression of airspace opacity with airspace opacity currently in each mid and lower lung regions. No consolidation. Appearance felt to be most consistent with atypical organism pneumonia. Advise correlation with COVID-19 status in this regard. Stable cardiomegaly.  No adenopathy evident. Aortic Atherosclerosis (ICD10-I70.0). Electronically Signed   By: Lowella Grip III M.D.   On: 07/14/2020 09:19    Scheduled Meds: . (feeding supplement) PROSource Plus  30 mL Oral BID BM  . allopurinol  100 mg Oral Daily  . amLODipine  10 mg Oral Daily  . aspirin EC  81 mg Oral Daily  . atorvastatin  80 mg Oral QPM  . calcitRIOL  0.5 mcg Oral Daily  . clopidogrel  75 mg Oral Daily  . enoxaparin (LOVENOX) injection  30 mg Subcutaneous Q24H  . gabapentin  100 mg Oral TID  . influenza vaccine adjuvanted  0.5 mL Intramuscular Tomorrow-1000  . insulin aspart  0-15 Units Subcutaneous TID WC  . insulin aspart  0-5 Units Subcutaneous QHS  . [START ON 07/16/2020] insulin glargine  25 Units Subcutaneous Daily  . levalbuterol  0.63 mg Nebulization Once  .  methylPREDNISolone (SOLU-MEDROL) injection  40 mg Intravenous Q12H  . metoprolol tartrate  100 mg Oral BID  . mirabegron ER  25 mg Oral Daily  . multivitamin with minerals  1 tablet Oral Daily  . pantoprazole  40 mg Oral Daily  . sodium bicarbonate  650 mg Oral BID  . sodium chloride flush  3 mL Intravenous Q12H  . timolol  1 drop Both Eyes Daily   Continuous Infusions: . lactated ringers 100 mL/hr at 07/14/20 0455     LOS: 6 days   Marylu Lund, MD [facility] Pager On Amion  If 7PM-7AM, please contact night-coverage 07/15/2020, 5:37 PM

## 2020-07-16 LAB — COMPREHENSIVE METABOLIC PANEL
ALT: 22 U/L (ref 0–44)
AST: 27 U/L (ref 15–41)
Albumin: 2 g/dL — ABNORMAL LOW (ref 3.5–5.0)
Alkaline Phosphatase: 147 U/L — ABNORMAL HIGH (ref 38–126)
Anion gap: 16 — ABNORMAL HIGH (ref 5–15)
BUN: 47 mg/dL — ABNORMAL HIGH (ref 8–23)
CO2: 17 mmol/L — ABNORMAL LOW (ref 22–32)
Calcium: 8.8 mg/dL — ABNORMAL LOW (ref 8.9–10.3)
Chloride: 104 mmol/L (ref 98–111)
Creatinine, Ser: 2.41 mg/dL — ABNORMAL HIGH (ref 0.44–1.00)
GFR calc Af Amer: 22 mL/min — ABNORMAL LOW (ref >60)
GFR calc non Af Amer: 19 mL/min — ABNORMAL LOW (ref >60)
Glucose, Bld: 195 mg/dL — ABNORMAL HIGH (ref 70–99)
Potassium: 4.9 mmol/L (ref 3.5–5.1)
Sodium: 137 mmol/L (ref 135–145)
Total Bilirubin: 1.2 mg/dL (ref 0.3–1.2)
Total Protein: 6.4 g/dL — ABNORMAL LOW (ref 6.5–8.1)

## 2020-07-16 LAB — GLUCOSE, CAPILLARY
Glucose-Capillary: 195 mg/dL — ABNORMAL HIGH (ref 70–99)
Glucose-Capillary: 233 mg/dL — ABNORMAL HIGH (ref 70–99)
Glucose-Capillary: 239 mg/dL — ABNORMAL HIGH (ref 70–99)
Glucose-Capillary: 254 mg/dL — ABNORMAL HIGH (ref 70–99)

## 2020-07-16 LAB — PROCALCITONIN: Procalcitonin: 2.33 ng/mL

## 2020-07-16 LAB — CBC
HCT: 31.3 % — ABNORMAL LOW (ref 36.0–46.0)
Hemoglobin: 10.4 g/dL — ABNORMAL LOW (ref 12.0–15.0)
MCH: 27.2 pg (ref 26.0–34.0)
MCHC: 33.2 g/dL (ref 30.0–36.0)
MCV: 81.7 fL (ref 80.0–100.0)
Platelets: 471 10*3/uL — ABNORMAL HIGH (ref 150–400)
RBC: 3.83 MIL/uL — ABNORMAL LOW (ref 3.87–5.11)
RDW: 14.5 % (ref 11.5–15.5)
WBC: 12.3 10*3/uL — ABNORMAL HIGH (ref 4.0–10.5)
nRBC: 0.5 % — ABNORMAL HIGH (ref 0.0–0.2)

## 2020-07-16 MED ORDER — FLUTICASONE PROPIONATE 50 MCG/ACT NA SUSP
1.0000 | Freq: Every day | NASAL | Status: DC
  Administered 2020-07-17 – 2020-07-21 (×5): 1 via NASAL
  Filled 2020-07-16: qty 16

## 2020-07-16 NOTE — Progress Notes (Signed)
Physical Therapy Treatment Patient Details Name: [name] MRN: [id number] DOB: 03-Feb-1946 Today's Date: 07/16/2020    History of Present Illness 74 year old female with past medical history notable for peripheral vascular disease, hypertension, hyperlipidemia, type 2 diabetes mellitus, CKD, CAD who presented to the ED with shortness of breath.  Found to have sepsis due to left lower extremity cellulitis.    PT Comments    Pt progressing slowly but steadily towards her physical therapy goals. Session focused on seated therapeutic exercises and ambulation progression. Pt ambulating 30 feet with a walker at a min guard assist level. SpO2 > 90% on 6L O2, DOE 3/4 with activity. Pt continues with significantly decreased cardiopulmonary endurance, weakness and balance deficits. Continue to recommend SNF for ongoing Physical Therapy.      Follow Up Recommendations  SNF     Equipment Recommendations  Rolling walker with 5" wheels;3in1 (PT);Wheelchair (measurements PT);Wheelchair cushion (measurements PT)    Recommendations for Other Services       Precautions / Restrictions Precautions Precautions: Fall;Other (comment) Precaution Comments: watch O2 Restrictions Weight Bearing Restrictions: No    Mobility  Bed Mobility               General bed mobility comments: OOB in chair  Transfers Overall transfer level: Needs assistance Equipment used: Rolling walker (2 wheeled) Transfers: Sit to/from Stand Sit to Stand: Supervision            Ambulation/Gait Ambulation/Gait assistance: Min guard Gait Distance (Feet): 30 Feet Assistive device: Rolling walker (2 wheeled) Gait Pattern/deviations: Step-through pattern;Decreased stride length Gait velocity: decreased   General Gait Details: Slow pace, mild instability, min guard for safety. Fatigues easily, DOE 3/4   Stairs             Wheelchair Mobility    Modified Rankin (Stroke Patients Only)        Balance Overall balance assessment: Needs assistance Sitting-balance support: Feet supported Sitting balance-Leahy Scale: Good     Standing balance support: Single extremity supported;During functional activity Standing balance-Leahy Scale: Poor Standing balance comment: reliant on reaching for chair armrest                            Cognition Arousal/Alertness: Awake/alert Behavior During Therapy: WFL for tasks assessed/performed Overall Cognitive Status: No family/caregiver present to determine baseline cognitive functioning Area of Impairment: Awareness;Safety/judgement                         Safety/Judgement: Decreased awareness of safety;Decreased awareness of deficits Awareness: Emergent          Exercises General Exercises - Lower Extremity Ankle Circles/Pumps: Both;15 reps;Seated Quad Sets: Both;15 reps;Seated    General Comments        Pertinent Vitals/Pain Pain Assessment: Faces Faces Pain Scale: Hurts a little bit Pain Location: L LE with movement Pain Descriptors / Indicators: Grimacing;Guarding;Discomfort Pain Intervention(s): Monitored during session    Home Living                      Prior Function            PT Goals (current goals can now be found in the care plan section) Acute Rehab PT Goals Patient Stated Goal: to breathe better, open to rehab Potential to Achieve Goals: Good Progress towards PT goals: Progressing toward goals    Frequency    Min 3X/week  PT Plan Current plan remains appropriate    Co-evaluation              AM-PAC PT "6 Clicks" Mobility   Outcome Measure  Help needed turning from your back to your side while in a flat bed without using bedrails?: None Help needed moving from lying on your back to sitting on the side of a flat bed without using bedrails?: None Help needed moving to and from a bed to a chair (including a wheelchair)?: A Little Help needed standing up  from a chair using your arms (e.g., wheelchair or bedside chair)?: A Little Help needed to walk in hospital room?: A Little Help needed climbing 3-5 steps with a railing? : A Lot 6 Click Score: 19    End of Session Equipment Utilized During Treatment: Oxygen Activity Tolerance: Patient limited by fatigue Patient left: in chair;with call bell/phone within reach;with chair alarm set Nurse Communication: Mobility status PT Visit Diagnosis: Other abnormalities of gait and mobility (R26.89);Muscle weakness (generalized) (M62.81);Pain Pain - Right/Left: Left Pain - part of body: Leg     Time: 1828-8337 PT Time Calculation (min) (ACUTE ONLY): 11 min  Charges:  $Therapeutic Activity: 8-22 mins                       Wyona Almas, PT, DPT Acute Rehabilitation Services Pager [phone] Office [phone]    Deno Etienne 07/16/2020, 5:24 PM

## 2020-07-16 NOTE — Plan of Care (Signed)

## 2020-07-16 NOTE — Progress Notes (Signed)
PROGRESS NOTE    [name]  CNO:[id number] DOB: 12-10-1945 DOA: 07/09/2020 PCP: Ashley Borg, MD    Brief Narrative:  74 year old female with past medical history notable for peripheral vascular disease, hypertension, hyperlipidemia, type 2 diabetes mellitus, CKD, CAD who presented to the ED with shortness of breath.  Patient has been feeling ill, with weakness, myalgias over the past few days.  She was seen by her PCP, Dr. Jenny [name] on 06/26/2020 and provided antibiotics.   In the ED, physical exam notable for left lower extremity cellulitis with multiple skin breaks and ulcerations.  WBC elevated 16.4. [facility] was consulted for admission for failed outpatient antibiotic therapy for cellulitis.  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  Recently started on outpatient antibiotics for lower extremity cellulitis without improvement.  On arrival to the ED, patient was noted to have elevated WBC count of 16.4, lactic acid of 2.3, CRP 27.7 with a procalcitonin of 59.95.  Blood cultures x2+ for gram-positive cocci in chains with BCID notable for Streptococcus species.  TTE with LVEF 50-60%, normal LV function, normal RV systolic function, no evidence of valvular vegetations on TTE. --Ceftriaxone> IV PCN>Amoxicillin 500mg  PO BID to complete 7 day course of antibiotics per ID, now signed off --Supportive care, antipyretics as needed --Procalcitonin cont to  trend down  Acute hypoxic respiratory failure Likely history of COPD, undiagnosed TTE with normal LVEF.  Chest x-ray with mild vascular congestion and basilar atelectasis. D-dimer elevated 2.59.  Vascular duplex ultrasound bilateral lower extremities negative for DVT. --Unable to perform CT angiogram chest 2/2 CKD, check NM VQ scan. --VQ unremarkable for acute PE --Wheezing on exam with decreased BS in the setting of past tobacco abuse x 40yrs. 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  Case was discussed with vascular surgery, Dr. Lurline Hare by admitting physician who recommended venous reflux study and placement of Unna boots by wound care if positive, unfortunately per ultrasound technician, only perform that exam outpatient.  Vascular ultrasound ABI demonstrating moderate arterial disease left lower extremity. --Likely will need close follow-up with vascular surgery after resolution of septicemia outpatient --Vascular Navigator following  CKD stage V (baseline Cr 2.6-2.8) --Renal diet --Calcitriol --sodium bicarb 650mg  BID --Avoid nephrotoxins, renally dose all medications --recheck bmet in AM  Type 2 diabetes mellitus Hemoglobin A1c 7.8 on 06/26/2020.  -See above, as high dosed steroids are ordered, thus increased lantus to 30 units daily with addition of 8 units meal coverage -Have increased SSI to resistant scale -Glycemic trends stable thus far  Hyperlipidemia: Continue atorvastatin 80 mg  p.o. daily as tolerated  PVD/CAD --Continue  aspirin 81 mg p.o. daily, Plavix 75 mg p.o. daily as pt tolerates  Essential hypertension --Continue with amlodipine 10mg  p.o. daily, metoprolol 100mg  BID  Chronic pain syndrome --Continue Zanaflex, Ultram  Morbid obesity BMI 43.8.   -Recommend diet/lifestyle modification -Outpatient follow-up with PCP, considerations of bariatric medicine outpatient referral.  DVT prophylaxis: Lovenox subq Code Status: Full Family Communication: Pt in room, family not at bedside  Status is: Inpatient  Remains inpatient appropriate because:Hemodynamically unstable, Unsafe d/c plan, IV treatments appropriate due to intensity of illness or inability to take PO and Inpatient level of care appropriate due to severity of illness   Dispo:  Patient From: Home  Planned Disposition: [place]  Expected discharge date: 07/17/20  Medically stable for discharge: No        Consultants:   ID  Vascular Surgery  Procedures:  TTE IMPRESSIONS  1. Left ventricular ejection fraction, by estimation, is 55 to 60%. The  left ventricle has normal function. The left ventricle has no regional  wall motion abnormalities. Left ventricular diastolic function could not  be evaluated.  2. Right ventricular systolic function is normal. The right ventricular  size is normal. There is normal pulmonary artery systolic pressure. The  estimated right ventricular systolic pressure is 36.6 mmHg.  3. The mitral valve is grossly normal. Mild to moderate mitral valve  regurgitation. No evidence of mitral stenosis.  4. The aortic valve is tricuspid. There is mild calcification of the  aortic valve. There is mild thickening of the aortic valve. Aortic valve  regurgitation is not visualized. Mild aortic valve sclerosis is present,  with no evidence of aortic valve  stenosis.  5. The inferior vena cava is normal in size with greater than 50%  respiratory variability, suggesting right atrial  pressure of 3 mmHg.   Vascular ultrasound ABI: Summary:  Right: Resting right ankle-brachial index is within normal range. No  evidence of significant right lower extremity arterial disease. The right  toe-brachial index is abnormal.   Left: Resting left ankle-brachial index indicates moderate left lower  extremity arterial disease. The left toe-brachial index is abnormal.    Antimicrobials: Anti-infectives (From admission, onward)   Start     Dose/Rate Route Frequency Ordered Stop   07/13/20 1400  amoxicillin (AMOXIL) capsule 500 mg        500 mg Oral Every 12 hours 07/13/20 1121 07/14/20 2234   07/12/20 1800  penicillin G potassium 6 Million Units in dextrose 5 % 250 mL continuous infusion  Status:  Discontinued        6 Million Units 20.8 mL/hr over 12 Hours Intravenous Every 12 hours 07/12/20 1332 07/13/20 1121   07/11/20 1900  vancomycin (VANCOREADY) IVPB 1500 mg/300 mL  Status:  Discontinued        1,500 mg 150 mL/hr over 120 Minutes Intravenous Every 48 hours 07/09/20 1815 07/10/20 0301   07/10/20 1800  cefTRIAXone (ROCEPHIN) 2 g in sodium chloride 0.9 % 100 mL IVPB  Status:  Discontinued        2 g 200 mL/hr over 30 Minutes Intravenous Every 24 hours 07/10/20 0301 07/12/20 1332   07/10/20 0000  piperacillin-tazobactam (ZOSYN) IVPB 2.25 g  Status:  Discontinued        2.25 g 100 mL/hr over 30 Minutes Intravenous Every 6 hours 07/09/20 1815 07/10/20 0301   07/09/20 1830  vancomycin (VANCOREADY) IVPB 2000 mg/400 mL        2,000  mg 200 mL/hr over 120 Minutes Intravenous  Once 07/09/20 1752 07/09/20 2143   07/09/20 1800  piperacillin-tazobactam (ZOSYN) IVPB 3.375 g        3.375 g 100 mL/hr over 30 Minutes Intravenous  Once 07/09/20 1752 07/09/20 1926   07/09/20 1230  ceFAZolin (ANCEF) IVPB 2g/100 mL premix        2 g 200 mL/hr over 30 Minutes Intravenous  Once 07/09/20 1216 07/09/20 1315   07/09/20 1200  ceFAZolin (ANCEF) IVPB 1 g/50 mL premix  Status:  Discontinued         1 g 100 mL/hr over 30 Minutes Intravenous  Once 07/09/20 1156 07/09/20 1216      Subjective: States breathing seems better today  Objective: Vitals:   07/15/20 1300 07/15/20 2207 07/16/20 0416 07/16/20 0807  BP: (!) 145/80 (!) 160/80 (!) 162/91 (!) 159/96  Pulse: 92 90 91 95  Resp: 16 18 18 14   Temp: 97.9 F (36.6 C) 97.8 F (36.6 C) (!) 97.5 F (36.4 C) (!) 97.4 F (36.3 C)  TempSrc: Oral Oral Oral Oral  SpO2: 91% 94% 93% 94%  Weight:        Intake/Output Summary (Last 24 hours) at 07/16/2020 1056 Last data filed at 07/16/2020 0900 Gross per 24 hour  Intake 480 ml  Output --  Net 480 ml   Filed Weights   07/10/20 1531  Weight: 104.8 kg    Examination: General exam: Awake, laying in bed, in nad Respiratory system: Normal respiratory effort, decreased BS, end-expiratory wheezing throughout Cardiovascular system: regular rate, s1, s2 Gastrointestinal system: Soft, nondistended, positive BS Central nervous system: CN2-12 grossly intact, strength intact Extremities: Perfused, no clubbing Skin: Normal skin turgor, no notable skin lesions seen Psychiatry: Mood normal // no visual hallucinations    Data Reviewed: I have personally reviewed following labs and imaging studies  CBC: Recent Labs  Lab 07/09/20 1239 07/10/20 0621 07/12/20 0635 07/13/20 0210 07/14/20 0832 07/15/20 1006 07/16/20 0505  WBC 16.4*   < > 11.0* 13.2* 15.5* 16.0* 12.3*  NEUTROABS 14.1*  --   --   --   --  11.4*  --   HGB 10.8*   < > 10.0* 10.5* 10.7* 10.4* 10.4*  HCT 33.3*   < > 30.8* 32.6* 32.3* 31.7* 31.3*  MCV 85.2   < > 85.8 85.6 83.7 82.1 81.7  PLT 257   < > 270 290 397 483* 471*   < > = values in this interval not displayed.   Basic Metabolic Panel: Recent Labs  Lab 07/11/20 0237 07/11/20 0237 07/12/20 0635 07/13/20 0210 07/14/20 0832 07/15/20 1006 07/16/20 0505  NA 138   < > 136 136 138 138 137  K 3.5   < > 4.2 3.9 3.6 3.5 4.9  CL 105   < > 103 101 102 102 104  CO2 21*    < > 21* 23 24 22  17*  GLUCOSE 171*   < > 166* 154* 168* 157* 195*  BUN 39*   < > 42* 44* 43* 41* 47*  CREATININE 2.71*   < > 2.54* 2.65* 2.38* 2.36* 2.41*  CALCIUM 8.0*   < > 8.5* 8.8* 9.3 9.2 8.8*  MG 1.6*  --  2.2 2.2 2.0 2.0  --    < > = values in this interval not displayed.   GFR: Estimated Creatinine Clearance: 22.1 mL/min (A) (by C-G formula based on SCr of 2.41 mg/dL (H)). Liver Function Tests: Recent Labs  Lab 07/09/20 1239  07/16/20 0505  AST 20 27  ALT 17 22  ALKPHOS 93 147*  BILITOT 0.8 1.2  PROT 6.2* 6.4*  ALBUMIN 2.8* 2.0*   No results for input(s): LIPASE, AMYLASE in the last 168 hours. No results for input(s): AMMONIA in the last 168 hours. Coagulation Profile: Recent Labs  Lab 07/09/20 1239  INR 1.2   Cardiac Enzymes: No results for input(s): CKTOTAL, CKMB, CKMBINDEX, TROPONINI in the last 168 hours. BNP (last 3 results) No results for input(s): PROBNP in the last 8760 hours. HbA1C: No results for input(s): HGBA1C in the last 72 hours. CBG: Recent Labs  Lab 07/15/20 0612 07/15/20 1225 07/15/20 1609 07/15/20 2214 07/16/20 0706  GLUCAP 109* 157* 307* 128* 195*   Lipid Profile: No results for input(s): CHOL, HDL, LDLCALC, TRIG, CHOLHDL, LDLDIRECT in the last 72 hours. Thyroid Function Tests: No results for input(s): TSH, T4TOTAL, FREET4, T3FREE, THYROIDAB in the last 72 hours. Anemia Panel: No results for input(s): VITAMINB12, FOLATE, FERRITIN, TIBC, IRON, RETICCTPCT in the last 72 hours. Sepsis Labs: Recent Labs  Lab 07/09/20 1239 07/09/20 2005 07/13/20 0210 07/14/20 0832 07/15/20 1006 07/16/20 0505  PROCALCITON  --    < > 16.36 6.57 3.80 2.33  LATICACIDVEN 2.3*  --   --   --   --   --    < > = values in this interval not displayed.    Recent Results (from the past 240 hour(s))  Blood Culture (routine x 2)     Status: Abnormal   Collection Time: 07/09/20 12:40 PM   Specimen: BLOOD RIGHT HAND  Result Value Ref Range Status    Specimen Description BLOOD RIGHT HAND  Final   Special Requests   Final    BOTTLES DRAWN AEROBIC AND ANAEROBIC Blood Culture results may not be optimal due to an inadequate volume of blood received in culture bottles   Culture  Setup Time   Final    AEROBIC BOTTLE ONLY GRAM POSITIVE COCCI IN CHAINS CRITICAL VALUE NOTED.  VALUE IS CONSISTENT WITH PREVIOUSLY REPORTED AND CALLED VALUE. Performed at [facility], [place] [address], [place], [place] [address]    Culture STREPTOCOCCUS GROUP G (A)  Final   Report Status 07/12/2020 FINAL  Final   Organism ID, Bacteria STREPTOCOCCUS GROUP G  Final      Susceptibility   Streptococcus group g - MIC*    CLINDAMYCIN <=0.25 SENSITIVE Sensitive     AMPICILLIN <=0.25 SENSITIVE Sensitive     ERYTHROMYCIN <=0.12 SENSITIVE Sensitive     VANCOMYCIN 0.5 SENSITIVE Sensitive     CEFTRIAXONE <=0.12 SENSITIVE Sensitive     LEVOFLOXACIN 0.5 SENSITIVE Sensitive     PENICILLIN Value in next row Sensitive      SENSITIVEBenzylpenicillin=0.06S    * STREPTOCOCCUS GROUP G  Blood Culture (routine x 2)     Status: Abnormal   Collection Time: 07/09/20 12:45 PM   Specimen: BLOOD LEFT HAND  Result Value Ref Range Status   Specimen Description BLOOD LEFT HAND  Final   Special Requests   Final    BOTTLES DRAWN AEROBIC AND ANAEROBIC Blood Culture adequate volume   Culture  Setup Time   Final    IN BOTH AEROBIC AND ANAEROBIC BOTTLES GRAM POSITIVE COCCI IN CHAINS Organism ID to follow CRITICAL RESULT CALLED TO, READ BACK BY AND VERIFIED WITH: V BRYK 07/10/20 0244 JDW    Culture (A)  Final    STREPTOCOCCUS GROUP G SUSCEPTIBILITIES PERFORMED ON PREVIOUS CULTURE WITHIN THE LAST  5 DAYS. Performed at [facility], [place] [address], [place], [place] [address]    Report Status 07/12/2020 FINAL  Final  Blood Culture ID Panel (Reflexed)     Status: Abnormal   Collection Time: 07/09/20 12:45 PM  Result Value Ref Range Status   Enterococcus faecalis NOT DETECTED NOT  DETECTED Final   Enterococcus Faecium NOT DETECTED NOT DETECTED Final   Listeria monocytogenes NOT DETECTED NOT DETECTED Final   Staphylococcus species NOT DETECTED NOT DETECTED Final   Staphylococcus aureus (BCID) NOT DETECTED NOT DETECTED Final   Staphylococcus epidermidis NOT DETECTED NOT DETECTED Final   Staphylococcus lugdunensis NOT DETECTED NOT DETECTED Final   Streptococcus species DETECTED (A) NOT DETECTED Final    Comment: Not Enterococcus species, Streptococcus agalactiae, Streptococcus pyogenes, or Streptococcus pneumoniae. CRITICAL RESULT CALLED TO, READ BACK BY AND VERIFIED WITH: V BRYK PHARMD 07/10/20 0244 JDW    Streptococcus agalactiae NOT DETECTED NOT DETECTED Final   Streptococcus pneumoniae NOT DETECTED NOT DETECTED Final   Streptococcus pyogenes NOT DETECTED NOT DETECTED Final   A.calcoaceticus-baumannii NOT DETECTED NOT DETECTED Final   Bacteroides fragilis NOT DETECTED NOT DETECTED Final   Enterobacterales NOT DETECTED NOT DETECTED Final   Enterobacter cloacae complex NOT DETECTED NOT DETECTED Final   Escherichia coli NOT DETECTED NOT DETECTED Final   Klebsiella aerogenes NOT DETECTED NOT DETECTED Final   Klebsiella oxytoca NOT DETECTED NOT DETECTED Final   Klebsiella pneumoniae NOT DETECTED NOT DETECTED Final   Proteus species NOT DETECTED NOT DETECTED Final   Salmonella species NOT DETECTED NOT DETECTED Final   Serratia marcescens NOT DETECTED NOT DETECTED Final   Haemophilus influenzae NOT DETECTED NOT DETECTED Final   Neisseria meningitidis NOT DETECTED NOT DETECTED Final   Pseudomonas aeruginosa NOT DETECTED NOT DETECTED Final   Stenotrophomonas maltophilia NOT DETECTED NOT DETECTED Final   Candida albicans NOT DETECTED NOT DETECTED Final   Candida auris NOT DETECTED NOT DETECTED Final   Candida glabrata NOT DETECTED NOT DETECTED Final   Candida krusei NOT DETECTED NOT DETECTED Final   Candida parapsilosis NOT DETECTED NOT DETECTED Final   Candida  tropicalis NOT DETECTED NOT DETECTED Final   Cryptococcus neoformans/gattii NOT DETECTED NOT DETECTED Final    Comment: Performed at [facility], [address], [place], [place] [address]  SARS Coronavirus 2 by RT PCR (hospital order, performed in [facility] hospital lab) Nasopharyngeal Nasopharyngeal Swab     Status: None   Collection Time: 07/14/20  5:33 AM   Specimen: Nasopharyngeal Swab  Result Value Ref Range Status   SARS Coronavirus 2 NEGATIVE NEGATIVE Final    Comment: (NOTE) SARS-CoV-2 target nucleic acids are NOT DETECTED.  The SARS-CoV-2 RNA is generally detectable in upper and lower respiratory specimens during the acute phase of infection. The lowest concentration of SARS-CoV-2 viral copies this assay can detect is 250 copies / mL. A negative result does not preclude SARS-CoV-2 infection and should not be used as the sole basis for treatment or other patient management decisions.  A negative result may occur with improper specimen collection / handling, submission of specimen other than nasopharyngeal swab, presence of viral mutation(s) within the areas targeted by this assay, and inadequate number of viral copies (<250 copies / mL). A negative result must be combined with clinical observations, patient history, and epidemiological information.  Fact Sheet for Patients:   StrictlyIdeas.no  Fact Sheet for Healthcare Providers: BankingDealers.co.za  This test is not yet approved or  cleared by the [place]  States FDA and has been authorized for detection and/or diagnosis of SARS-CoV-2 by FDA under an Emergency Use Authorization (EUA).  This EUA will remain in effect (meaning this test can be used) for the duration of the COVID-19 declaration under Section 564(b)(1) of the Act, 21 U.S.C. section 360bbb-3(b)(1), unless the authorization is terminated or revoked sooner.  Performed at [facility], [place] [address], [place], [place] [address]      Radiology Studies: NM Pulmonary Perfusion  Result Date: 07/14/2020 CLINICAL DATA:  Positive D-dimer study.  Shortness of breath EXAM: NUCLEAR MEDICINE PERFUSION LUNG SCAN TECHNIQUE: Perfusion images were obtained in multiple projections after intravenous injection of radiopharmaceutical. Views: Anterior, posterior, left lateral, right lateral, RPO, LPO, RAO, LAO RADIOPHARMACEUTICALS:  3.9 mCi Tc-71m MAA IV COMPARISON:  Chest radiograph November 23, 2017 FINDINGS: Radiotracer uptake is homogeneous and symmetric bilaterally. No perfusion defects evident. IMPRESSION: No evident perfusion defects. No findings indicative of pulmonary embolus. Electronically Signed   By: Lowella Grip III M.D.   On: 07/14/2020 14:42    Scheduled Meds: . (feeding supplement) PROSource Plus  30 mL Oral BID BM  . allopurinol  100 mg Oral Daily  . amLODipine  10 mg Oral Daily  . aspirin EC  81 mg Oral Daily  . atorvastatin  80 mg Oral QPM  . calcitRIOL  0.5 mcg Oral Daily  . clopidogrel  75 mg Oral Daily  . enoxaparin (LOVENOX) injection  30 mg Subcutaneous Q24H  . fluticasone  1 spray Each Nare Daily  . gabapentin  100 mg Oral TID  . influenza vaccine adjuvanted  0.5 mL Intramuscular Tomorrow-1000  . insulin aspart  0-20 Units Subcutaneous TID WC  . insulin aspart  0-5 Units Subcutaneous QHS  . insulin aspart  8 Units Subcutaneous TID WC  . insulin glargine  30 Units Subcutaneous Daily  . levalbuterol  0.63 mg Nebulization Once  . methylPREDNISolone (SOLU-MEDROL) injection  40 mg Intravenous Q12H  . metoprolol tartrate  100 mg Oral BID  . mirabegron ER  25 mg Oral Daily  . multivitamin with minerals  1 tablet Oral Daily  . pantoprazole  40 mg Oral Daily  . sodium bicarbonate  650 mg Oral BID  . sodium chloride flush  3 mL Intravenous Q12H  . timolol  1 drop Both Eyes Daily   Continuous Infusions: . lactated ringers 100 mL/hr at 07/14/20 0455     LOS: 7 days    Marylu Lund, MD [facility] Pager On Amion  If 7PM-7AM, please contact night-coverage 07/16/2020, 10:56 AM

## 2020-07-16 NOTE — [place] FL2 (Signed)
[place] MEDICAID FL2 LEVEL OF CARE SCREENING TOOL     IDENTIFICATION  Patient Name: [name] Birthdate: October 01, 1946 Sex: female Admission Date (Current Location): 07/09/2020  [facility] and [place] Number:  Herbalist and Address:  [facility] [place], [place] [address], [place], [place] [address]      Provider Number: [id number]  Attending Physician Name and Address:  Donne Hazel, MD  Relative Name and Phone Number:  [name], daughter, [phone]    Current Level of Care: Hospital Recommended Level of Care: [place] Prior Approval Number:    Date Approved/Denied:   PASRR Number: [id number]  Discharge Plan: SNF    Current Diagnoses: Patient Active Problem List   Diagnosis Date Noted  . Streptococcal bacteremia 07/10/2020  . Venous stasis ulcers ([place]) 07/10/2020  . Sepsis due to cellulitis ([place]) 07/09/2020  . Obesity, Class III, BMI 40-49.9 (morbid obesity) ([place]) 07/09/2020  . Diabetic leg ulcer ([place]) 12/23/2019  . Right shoulder pain 07/08/2019  . Urinary incontinence 07/08/2019  . Gait disorder 08/04/2018  . Cellulitis of both lower extremities 07/26/2018  . Cellulitis 07/26/2018  . Cellulitis of right leg 06/14/2018  . Left wrist pain 06/14/2018  . Constipation 12/13/2017  . Hyperlipidemia LDL goal <130 11/23/2017  . Right upper quadrant abdominal tenderness without rebound tenderness 11/23/2017  . Cholelithiasis 11/17/2017  . Aortic atherosclerosis ([place]) 11/17/2017  . Diabetes mellitus with complication ([place]) 19/41/7408  . 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 Somnolence 06/27/2017  . Chronic pain 06/27/2017  . Bilateral hand numbness 06/08/2017  . Low back pain 03/09/2017  . Insomnia 12/27/2016  . Deficiency anemia 05/19/2016  . End stage renal disease ([place]) 01/15/2013  . Bilateral foot pain 09/07/2012  . Sciatica of left side 03/10/2012  . 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 Renal artery stenosis ([place]) 04/25/2011  . Preventative health care 04/12/2011  . PERIPHERAL EDEMA 04/01/2010  . B12 deficiency anemia 10/01/2009  . Right cervical radiculopathy 06/17/2008  . ANEMIA-IRON DEFICIENCY 12/13/2007  . PERIPHERAL VASCULAR DISEASE 12/13/2007  . Pure hyperglyceridemia 06/11/2007  . GLAUCOMA NOS 06/11/2007  . MITRAL REGURGITATION, 2 PLUS 06/11/2007  . Essential hypertension 06/11/2007  . CAD in native artery 06/11/2007  . CAROTID ARTERY STENOSIS 06/11/2007  . GERD 06/11/2007  . Peptic Ulcer, Site NOS 06/11/2007  . OSTEOPOROSIS 06/11/2007    Orientation RESPIRATION BLADDER Height & Weight     Self, Time, Situation, Place  O2 (Nasal cannula) Continent Weight: 231 lb (104.8 kg) Height:     BEHAVIORAL SYMPTOMS/MOOD NEUROLOGICAL BOWEL NUTRITION STATUS      Continent Diet (Please see DC Summary)  AMBULATORY STATUS COMMUNICATION OF NEEDS Skin   Limited Assist Verbally Other (Comment) (Diabetic ulcer on leg)                       Personal Care Assistance Level of Assistance  Bathing, Feeding, Dressing Bathing Assistance: Limited assistance Feeding assistance: Independent Dressing Assistance: Limited assistance     Functional Limitations Info             SPECIAL CARE FACTORS FREQUENCY  PT (By licensed PT), OT (By licensed OT)     PT Frequency: 5x/week OT Frequency: 5x/week            Contractures Contractures Info: Not present    Additional Factors Info  Code Status, Allergies, Insulin Sliding Scale Code Status Info: Full Allergies  Info: NKA   Insulin Sliding Scale Info: see dc summary       Current Medications (07/16/2020):  This is the current hospital active medication list Current Facility-Administered Medications  Medication Dose Route Frequency Provider Last Rate Last Admin  . (feeding supplement) PROSource Plus liquid 30 mL  30 mL Oral BID BM [place] Eric J, DO   30 mL at 07/16/20 1029  . acetaminophen  (TYLENOL) tablet 650 mg  650 mg Oral Q6H PRN Karmen Bongo, MD   650 mg at 07/12/20 1230   Or  . acetaminophen (TYLENOL) suppository 650 mg  650 mg Rectal Q6H PRN Karmen Bongo, MD      . allopurinol (ZYLOPRIM) tablet 100 mg  100 mg Oral Daily Karmen Bongo, MD   100 mg at 07/16/20 1028  . amLODipine (NORVASC) tablet 10 mg  10 mg Oral Daily Karmen Bongo, MD   10 mg at 07/16/20 1028  . aspirin EC tablet 81 mg  81 mg Oral Daily Karmen Bongo, MD   81 mg at 07/16/20 1028  . atorvastatin (LIPITOR) tablet 80 mg  80 mg Oral QPM Karmen Bongo, MD   80 mg at 07/15/20 1700  . calcitRIOL (ROCALTROL) capsule 0.5 mcg  0.5 mcg Oral Daily Karmen Bongo, MD   0.5 mcg at 07/16/20 1028  . clopidogrel (PLAVIX) tablet 75 mg  75 mg Oral Daily Karmen Bongo, MD   75 mg at 07/16/20 1028  . enoxaparin (LOVENOX) injection 30 mg  30 mg Subcutaneous Q24H Karmen Bongo, MD   30 mg at 07/15/20 2209  . fluticasone (FLONASE) 50 MCG/ACT nasal spray 1 spray  1 spray Each Nare Daily Donne Hazel, MD      . gabapentin (NEURONTIN) capsule 100 mg  100 mg Oral TID Karmen Bongo, MD   100 mg at 07/16/20 1028  . influenza vaccine adjuvanted (FLUAD) injection 0.5 mL  0.5 mL Intramuscular Tomorrow-1000 Karmen Bongo, MD      . insulin aspart (novoLOG) injection 0-20 Units  0-20 Units Subcutaneous TID WC Donne Hazel, MD   4 Units at 07/16/20 0830  . insulin aspart (novoLOG) injection 0-5 Units  0-5 Units Subcutaneous QHS Donne Hazel, MD      . insulin aspart (novoLOG) injection 8 Units  8 Units Subcutaneous TID WC Donne Hazel, MD   8 Units at 07/16/20 0830  . insulin glargine (LANTUS) injection 30 Units  30 Units Subcutaneous Daily Donne Hazel, MD   30 Units at 07/16/20 1029  . lactated ringers infusion   Intravenous Continuous Karmen Bongo, MD 100 mL/hr at 07/14/20 0455 New Bag at 07/14/20 0455  . levalbuterol (XOPENEX) nebulizer solution 0.63 mg  0.63 mg Nebulization Q8H PRN Lovey Newcomer T,  NP   0.63 mg at 07/12/20 2115  . levalbuterol (XOPENEX) nebulizer solution 0.63 mg  0.63 mg Nebulization Once [place], Scarlette Shorts T, NP      . methylPREDNISolone sodium succinate (SOLU-MEDROL) 40 mg/mL injection 40 mg  40 mg Intravenous Q12H Donne Hazel, MD   40 mg at 07/16/20 0117  . metoprolol tartrate (LOPRESSOR) tablet 100 mg  100 mg Oral BID Karmen Bongo, MD   100 mg at 07/16/20 1028  . mirabegron ER (MYRBETRIQ) tablet 25 mg  25 mg Oral Daily Karmen Bongo, MD   25 mg at 07/16/20 1028  . multivitamin with minerals tablet 1 tablet  1 tablet Oral Daily [place] Eric J, DO   1 tablet at 07/16/20 1028  . ondansetron (  ZOFRAN) tablet 4 mg  4 mg Oral Q6H PRN Karmen Bongo, MD       Or  . ondansetron [facility]) injection 4 mg  4 mg Intravenous Q6H PRN Karmen Bongo, MD   4 mg at 07/10/20 2111  . pantoprazole (PROTONIX) EC tablet 40 mg  40 mg Oral Daily Karmen Bongo, MD   40 mg at 07/16/20 1028  . polyethylene glycol (MIRALAX / GLYCOLAX) packet 17 g  1 packet Oral Daily PRN Karmen Bongo, MD      . sodium bicarbonate tablet 650 mg  650 mg Oral BID Karmen Bongo, MD   650 mg at 07/16/20 1028  . sodium chloride flush (NS) 0.9 % injection 3 mL  3 mL Intravenous Q12H Karmen Bongo, MD   3 mL at 07/13/20 2232  . technetium albumin aggregated (MAA) injection solution 3.9 millicurie  3.9 millicurie Intravenous Once PRN Gaspar Cola, MD      . timolol (TIMOPTIC) 0.5 % ophthalmic solution 1 drop  1 drop Both Eyes Daily Karmen Bongo, MD   1 drop at 07/16/20 1027  . tiZANidine (ZANAFLEX) tablet 4 mg  4 mg Oral Q6H PRN Karmen Bongo, MD   4 mg at 07/12/20 2116  . traMADol (ULTRAM) tablet 50 mg  50 mg Oral Q6H PRN Karmen Bongo, MD   50 mg at 07/13/20 2231  . zolpidem (AMBIEN) tablet 5 mg  5 mg Oral QHS PRN Karmen Bongo, MD   5 mg at 07/09/20 2254     Discharge Medications: Please see discharge summary for a list of discharge medications.  Relevant Imaging Results:  Relevant Lab  Results:   Additional Information SSN: [place]

## 2020-07-17 DIAGNOSIS — N179 Acute kidney failure, unspecified: Secondary | ICD-10-CM

## 2020-07-17 LAB — CBC
HCT: 28.5 % — ABNORMAL LOW (ref 36.0–46.0)
Hemoglobin: 9.6 g/dL — ABNORMAL LOW (ref 12.0–15.0)
MCH: 27.7 pg (ref 26.0–34.0)
MCHC: 33.7 g/dL (ref 30.0–36.0)
MCV: 82.1 fL (ref 80.0–100.0)
Platelets: 473 10*3/uL — ABNORMAL HIGH (ref 150–400)
RBC: 3.47 MIL/uL — ABNORMAL LOW (ref 3.87–5.11)
RDW: 14.5 % (ref 11.5–15.5)
WBC: 13.7 10*3/uL — ABNORMAL HIGH (ref 4.0–10.5)
nRBC: 0.4 % — ABNORMAL HIGH (ref 0.0–0.2)

## 2020-07-17 LAB — COMPREHENSIVE METABOLIC PANEL
ALT: 21 U/L (ref 0–44)
AST: 21 U/L (ref 15–41)
Albumin: 2.1 g/dL — ABNORMAL LOW (ref 3.5–5.0)
Alkaline Phosphatase: 132 U/L — ABNORMAL HIGH (ref 38–126)
Anion gap: 15 (ref 5–15)
BUN: 58 mg/dL — ABNORMAL HIGH (ref 8–23)
CO2: 21 mmol/L — ABNORMAL LOW (ref 22–32)
Calcium: 8.7 mg/dL — ABNORMAL LOW (ref 8.9–10.3)
Chloride: 98 mmol/L (ref 98–111)
Creatinine, Ser: 2.58 mg/dL — ABNORMAL HIGH (ref 0.44–1.00)
GFR calc Af Amer: 20 mL/min — ABNORMAL LOW (ref >60)
GFR calc non Af Amer: 17 mL/min — ABNORMAL LOW (ref >60)
Glucose, Bld: 317 mg/dL — ABNORMAL HIGH (ref 70–99)
Potassium: 4.4 mmol/L (ref 3.5–5.1)
Sodium: 134 mmol/L — ABNORMAL LOW (ref 135–145)
Total Bilirubin: 0.6 mg/dL (ref 0.3–1.2)
Total Protein: 6.2 g/dL — ABNORMAL LOW (ref 6.5–8.1)

## 2020-07-17 LAB — GLUCOSE, CAPILLARY
Glucose-Capillary: 282 mg/dL — ABNORMAL HIGH (ref 70–99)
Glucose-Capillary: 241 mg/dL — ABNORMAL HIGH (ref 70–99)
Glucose-Capillary: 287 mg/dL — ABNORMAL HIGH (ref 70–99)
Glucose-Capillary: 263 mg/dL — ABNORMAL HIGH (ref 70–99)

## 2020-07-17 LAB — PROCALCITONIN: Procalcitonin: 1.68 ng/mL

## 2020-07-17 NOTE — Care Management Important Message (Signed)
Important Message  Patient Details  Name: [name] MRN: [id number] Date of Birth: [birth year]   Medicare Important Message Given:  Yes - Important Message mailed due to current National Emergency  Verbal consent obtained due to current National Emergency  Relationship to patient: Self Contact Name: Lilyahna Sirmon Call Date: 07/17/20  Time: 1227 Phone: [phone] Outcome: Spoke with contact Important Message mailed to: Patient address on file    [name] 07/17/2020, 12:27 PM

## 2020-07-17 NOTE — Plan of Care (Signed)

## 2020-07-17 NOTE — Plan of Care (Signed)

## 2020-07-17 NOTE — TOC Progression Note (Signed)
Transition of Care [facility]) - Progression Note    Patient Details  Name: [name] MRN: [id number] Date of Birth: 1946-04-04  Transition of Care [facility]) CM/SW [place], [place] Work Phone Number: 07/17/2020, 11:43 AM  Clinical Narrative:    CSW intern received consult for possible SNF placement at time of discharge. CSW intern spoke with patient regarding PT recommendation of SNF placement at time of discharge. Patient expressed understanding of PT recommendation and is agreeable to SNF placement at time of discharge. Patient instructed me to involve daughter in process of choosing a SNF. Patient and daughter [name] reports preference for [facility] healthcare. CSW intern discussed insurance authorization process and provided Medicare SNF ratings list. Patient has received the COVID vaccines. Patient expressed being hopeful for rehab and to feel better soon. No further questions reported at this time. CSW intern to continue to follow and assist with discharge planning needs. Daughter reported that the patient has medicaid that pays for medicare premiums and that SNF would just need to contact DSS to have it switched to facility medicaid for long term care.      Expected Discharge Plan: Skilled Nursing Facility Barriers to Discharge: Continued Medical Work up  Expected Discharge Plan and Services Expected Discharge Plan: [place]   Discharge Planning Services: CM Consult   Living arrangements for the past 2 months: Single Family Home                                       Social Determinants of Health (SDOH) Interventions    Readmission Risk Interventions Readmission Risk Prevention Plan 07/10/2020  Transportation Screening Complete  PCP or Specialist Appt within 3-5 Days Complete  HRI or [place] Complete  Social Work Consult for [place] Planning/Counseling Complete  Palliative Care Screening Complete  Medication  Review Press photographer) Complete  Some recent data might be hidden

## 2020-07-17 NOTE — Progress Notes (Signed)
PROGRESS NOTE    DAY GREB  ZOX:[id number] DOB: 1946-10-12 DOA: 07/09/2020 PCP: [name], MD    Brief Narrative:  74 year old female with past medical history notable for peripheral vascular disease, hypertension, hyperlipidemia, type 2 diabetes mellitus, CKD, CAD who presented to the ED with shortness of breath.  Patient has been feeling ill, with weakness, myalgias over the past few days.  She was seen by her PCP, Dr. [name] on 06/26/2020 and provided antibiotics.   In the ED, physical exam notable for left lower extremity cellulitis with multiple skin breaks and ulcerations.  WBC elevated 16.4. TR[facility] was consulted for admission for failed outpatient antibiotic therapy for cellulitis.  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  Recently started on outpatient antibiotics for lower extremity cellulitis without improvement.  On arrival to the ED, patient was noted to have elevated WBC count of 16.4, lactic acid of 2.3, CRP 27.7 with a procalcitonin of 59.95.  Blood cultures x2+ for gram-positive cocci in chains with BCID notable for Streptococcus species.  TTE with LVEF 50-60%, normal LV function, normal RV systolic function, no evidence of valvular vegetations on TTE. --Pt completed 7 day course of antibiotics per ID, now signed off --Supportive care, antipyretics as needed --Procalcitonin steadily trend down  Acute hypoxic  respiratory failure Likely history of COPD, undiagnosed TTE with normal LVEF.  Chest x-ray with mild vascular congestion and basilar atelectasis. D-dimer elevated 2.59.  Vascular duplex ultrasound bilateral lower extremities negative for DVT. --Unable to perform CT angiogram chest 2/2 CKD, check NM VQ scan. --VQ unremarkable for acute PE --Wheezing on exam with decreased BS in the setting of past tobacco abuse x 35yrs. 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  Case was discussed with vascular surgery, Dr. Lurline Hare by admitting physician who recommended venous reflux study and placement of Unna boots by wound care if positive, unfortunately per ultrasound technician, only perform that exam outpatient.  Vascular ultrasound ABI demonstrating moderate arterial disease left lower extremity. --Likely will need close follow-up with vascular surgery after resolution of septicemia outpatient --Vascular Navigator following  CKD stage V (baseline Cr 2.6-2.8) --Continued with renal diet --Calcitriol --sodium bicarb 650mg  BID --Avoid nephrotoxins, renally dose all medications --recheck bmet in AM  Type 2 diabetes mellitus Hemoglobin A1c 7.8 on 06/26/2020.  -See above, as high dosed steroids are ordered, thus increased lantus to 30 units daily with addition of 8 units meal coverage -Have increased SSI to resistant scale -Glycemic trends overall stable at this time  Hyperlipidemia: Continue  atorvastatin 80 mg p.o. daily as tolerated  PVD/CAD --Continue  aspirin 81 mg p.o. daily, Plavix 75 mg p.o. daily as pt tolerates  Essential hypertension --Continue with amlodipine 10mg  p.o. daily, metoprolol 100mg  BID - Cont current regimen  Chronic pain syndrome --Continue Zanaflex, Ultram  Morbid obesity BMI 43.8.   -Recommend diet/lifestyle modification -Outpatient follow-up with PCP, considerations of bariatric medicine outpatient referral.  DVT prophylaxis: Lovenox subq Code Status: Full Family Communication: Pt in room, family not at bedside  Status is: Inpatient  Remains inpatient appropriate because:Hemodynamically unstable, Unsafe d/c plan, IV treatments appropriate due to intensity of illness or inability to take PO and Inpatient level of care appropriate due to severity of illness   Dispo:  Patient From: Home  Planned Disposition: [place]  Expected discharge date: 07/18/20  Medically stable for discharge: No    Consultants:   ID  Vascular Surgery  Procedures:  TTE IMPRESSIONS  1. Left ventricular ejection fraction, by estimation, is 55 to 60%. The  left ventricle has normal function. The left ventricle has no regional  wall motion abnormalities. Left ventricular diastolic function could not  be evaluated.  2. Right ventricular systolic function is normal. The right ventricular  size is normal. There is normal pulmonary artery systolic pressure. The  estimated right ventricular systolic pressure is 81.8 mmHg.  3. The mitral valve is grossly normal. Mild to moderate mitral valve  regurgitation. No evidence of mitral stenosis.  4. The aortic valve is tricuspid. There is mild calcification of the  aortic valve. There is mild thickening of the aortic valve. Aortic valve  regurgitation is not visualized. Mild aortic valve sclerosis is present,  with no evidence of aortic valve  stenosis.  5. The inferior vena cava is normal in size with greater than 50%  respiratory variability, suggesting  right atrial pressure of 3 mmHg.   Vascular ultrasound ABI: Summary:  Right: Resting right ankle-brachial index is within normal range. No  evidence of significant right lower extremity arterial disease. The right  toe-brachial index is abnormal.   Left: Resting left ankle-brachial index indicates moderate left lower  extremity arterial disease. The left toe-brachial index is abnormal.    Antimicrobials: Anti-infectives (From admission, onward)   Start     Dose/Rate Route Frequency Ordered Stop   07/13/20 1400  amoxicillin (AMOXIL) capsule 500 mg        500 mg Oral Every 12 hours 07/13/20 1121 07/14/20 2234   07/12/20 1800  penicillin G potassium 6 Million Units in dextrose 5 % 250 mL continuous infusion  Status:  Discontinued        6 Million Units 20.8 mL/hr over 12 Hours Intravenous Every 12 hours 07/12/20 1332 07/13/20 1121   07/11/20 1900  vancomycin (VANCOREADY) IVPB 1500 mg/300 mL  Status:  Discontinued        1,500 mg 150 mL/hr over 120 Minutes Intravenous Every 48 hours 07/09/20 1815 07/10/20 0301   07/10/20 1800  cefTRIAXone (ROCEPHIN) 2 g in sodium chloride 0.9 % 100 mL IVPB  Status:  Discontinued        2 g 200 mL/hr over 30 Minutes Intravenous Every 24 hours 07/10/20 0301 07/12/20 1332   07/10/20 0000  piperacillin-tazobactam (ZOSYN) IVPB 2.25 g  Status:  Discontinued        2.25 g 100 mL/hr over 30 Minutes Intravenous Every 6 hours 07/09/20 1815 07/10/20 0301   07/09/20 1830  vancomycin (VANCOREADY) IVPB 2000 mg/400 mL  2,000 mg 200 mL/hr over 120 Minutes Intravenous  Once 07/09/20 1752 07/09/20 2143   07/09/20 1800  piperacillin-tazobactam (ZOSYN) IVPB 3.375 g        3.375 g 100 mL/hr over 30 Minutes Intravenous  Once 07/09/20 1752 07/09/20 1926   07/09/20 1230  ceFAZolin (ANCEF) IVPB 2g/100 mL premix        2 g 200 mL/hr over 30 Minutes Intravenous  Once 07/09/20 1216 07/09/20 1315   07/09/20 1200  ceFAZolin (ANCEF) IVPB 1 g/50 mL premix  Status:   Discontinued        1 g 100 mL/hr over 30 Minutes Intravenous  Once 07/09/20 1156 07/09/20 1216      Subjective: Reports breathing better today  Objective: Vitals:   07/17/20 0500 07/17/20 0805 07/17/20 0807 07/17/20 1433  BP: 123/69 123/69 (!) 151/66 (!) 147/73  Pulse: 65 65 75 70  Resp: 15 15 18 17   Temp: 98 F (36.7 C) 98 F (36.7 C) 97.7 F (36.5 C) 97.8 F (36.6 C)  TempSrc: Oral Oral Oral Oral  SpO2: 95%  94% 95%  Weight:  104.8 kg    Height:  5\' 1"  (1.549 m)      Intake/Output Summary (Last 24 hours) at 07/17/2020 1609 Last data filed at 07/17/2020 1013 Gross per 24 hour  Intake 480 ml  Output --  Net 480 ml   Filed Weights   07/10/20 1531 07/17/20 0805  Weight: 104.8 kg 104.8 kg    Examination: General exam: Conversant, in no acute distress Respiratory system: increased resp effort, end-expiratory wheezing Cardiovascular system: regular rhythm, s1-s2 Gastrointestinal system: Nondistended, nontender, pos BS Central nervous system: No seizures, no tremors Extremities: No cyanosis, no joint deformities Skin: No rashes, no pallor Psychiatry: Affect normal // no auditory hallucinations   Data Reviewed: I have personally reviewed following labs and imaging studies  CBC: Recent Labs  Lab 07/13/20 0210 07/14/20 0832 07/15/20 1006 07/16/20 0505 07/17/20 0453  WBC 13.2* 15.5* 16.0* 12.3* 13.7*  NEUTROABS  --   --  11.4*  --   --   HGB 10.5* 10.7* 10.4* 10.4* 9.6*  HCT 32.6* 32.3* 31.7* 31.3* 28.5*  MCV 85.6 83.7 82.1 81.7 82.1  PLT 290 397 483* 471* 332*   Basic Metabolic Panel: Recent Labs  Lab 07/11/20 0237 07/11/20 0237 07/12/20 0635 07/12/20 0635 07/13/20 0210 07/14/20 0832 07/15/20 1006 07/16/20 0505 07/17/20 0453  NA 138   < > 136   < > 136 138 138 137 134*  K 3.5   < > 4.2   < > 3.9 3.6 3.5 4.9 4.4  CL 105   < > 103   < > 101 102 102 104 98  CO2 21*   < > 21*   < > 23 24 22  17* 21*  GLUCOSE 171*   < > 166*   < > 154* 168* 157* 195*  317*  BUN 39*   < > 42*   < > 44* 43* 41* 47* 58*  CREATININE 2.71*   < > 2.54*   < > 2.65* 2.38* 2.36* 2.41* 2.58*  CALCIUM 8.0*   < > 8.5*   < > 8.8* 9.3 9.2 8.8* 8.7*  MG 1.6*  --  2.2  --  2.2 2.0 2.0  --   --    < > = values in this interval not displayed.   GFR: Estimated Creatinine Clearance: 20.7 mL/min (A) (by C-G formula based on SCr of 2.58 mg/dL (H)). Liver Function  Tests: Recent Labs  Lab 07/16/20 0505 07/17/20 0453  AST 27 21  ALT 22 21  ALKPHOS 147* 132*  BILITOT 1.2 0.6  PROT 6.4* 6.2*  ALBUMIN 2.0* 2.1*   No results for input(s): LIPASE, AMYLASE in the last 168 hours. No results for input(s): AMMONIA in the last 168 hours. Coagulation Profile: No results for input(s): INR, PROTIME in the last 168 hours. Cardiac Enzymes: No results for input(s): CKTOTAL, CKMB, CKMBINDEX, TROPONINI in the last 168 hours. BNP (last 3 results) No results for input(s): PROBNP in the last 8760 hours. HbA1C: No results for input(s): HGBA1C in the last 72 hours. CBG: Recent Labs  Lab 07/16/20 1145 07/16/20 1707 07/16/20 2007 07/17/20 0652 07/17/20 1155  GLUCAP 233* 239* 254* 282* 241*   Lipid Profile: No results for input(s): CHOL, HDL, LDLCALC, TRIG, CHOLHDL, LDLDIRECT in the last 72 hours. Thyroid Function Tests: No results for input(s): TSH, T4TOTAL, FREET4, T3FREE, THYROIDAB in the last 72 hours. Anemia Panel: No results for input(s): VITAMINB12, FOLATE, FERRITIN, TIBC, IRON, RETICCTPCT in the last 72 hours. Sepsis Labs: Recent Labs  Lab 07/14/20 0832 07/15/20 1006 07/16/20 0505 07/17/20 0453  PROCALCITON 6.57 3.80 2.33 1.68    Recent Results (from the past 240 hour(s))  Blood Culture (routine x 2)     Status: Abnormal   Collection Time: 07/09/20 12:40 PM   Specimen: BLOOD RIGHT HAND  Result Value Ref Range Status   Specimen Description BLOOD RIGHT HAND  Final   Special Requests   Final    BOTTLES DRAWN AEROBIC AND ANAEROBIC Blood Culture results may not be  optimal due to an inadequate volume of blood received in culture bottles   Culture  Setup Time   Final    AEROBIC BOTTLE ONLY GRAM POSITIVE COCCI IN CHAINS CRITICAL VALUE NOTED.  VALUE IS CONSISTENT WITH PREVIOUSLY REPORTED AND CALLED VALUE. Performed at [facility], [place] [address] [place], [place] [address]    Culture STREPTOCOCCUS GROUP G (A)  Final   Report Status 07/12/2020 FINAL  Final   Organism ID, Bacteria STREPTOCOCCUS GROUP G  Final      Susceptibility   Streptococcus group g - MIC*    CLINDAMYCIN <=0.25 SENSITIVE Sensitive     AMPICILLIN <=0.25 SENSITIVE Sensitive     ERYTHROMYCIN <=0.12 SENSITIVE Sensitive     VANCOMYCIN 0.5 SENSITIVE Sensitive     CEFTRIAXONE <=0.12 SENSITIVE Sensitive     LEVOFLOXACIN 0.5 SENSITIVE Sensitive     PENICILLIN Value in next row Sensitive      SENSITIVEBenzylpenicillin=0.06S    * STREPTOCOCCUS GROUP G  Blood Culture (routine x 2)     Status: Abnormal   Collection Time: 07/09/20 12:45 PM   Specimen: BLOOD LEFT HAND  Result Value Ref Range Status   Specimen Description BLOOD LEFT HAND  Final   Special Requests   Final    BOTTLES DRAWN AEROBIC AND ANAEROBIC Blood Culture adequate volume   Culture  Setup Time   Final    IN BOTH AEROBIC AND ANAEROBIC BOTTLES GRAM POSITIVE COCCI IN CHAINS Organism ID to follow CRITICAL RESULT CALLED TO, READ BACK BY AND VERIFIED WITH: V BRYK 07/10/20 0244 JDW    Culture (A)  Final    STREPTOCOCCUS GROUP G SUSCEPTIBILITIES PERFORMED ON PREVIOUS CULTURE WITHIN THE LAST 5 DAYS. Performed at [facility], [place] [address] [place], [place] [address]    Report Status 07/12/2020 FINAL  Final  Blood Culture ID Panel (Reflexed)  Status: Abnormal   Collection Time: 07/09/20 12:45 PM  Result Value Ref Range Status   Enterococcus faecalis NOT DETECTED NOT DETECTED Final   Enterococcus Faecium NOT DETECTED NOT DETECTED Final   Listeria monocytogenes NOT DETECTED NOT DETECTED Final   Staphylococcus  species NOT DETECTED NOT DETECTED Final   Staphylococcus aureus (BCID) NOT DETECTED NOT DETECTED Final   Staphylococcus epidermidis NOT DETECTED NOT DETECTED Final   Staphylococcus lugdunensis NOT DETECTED NOT DETECTED Final   Streptococcus species DETECTED (A) NOT DETECTED Final    Comment: Not Enterococcus species, Streptococcus agalactiae, Streptococcus pyogenes, or Streptococcus pneumoniae. CRITICAL RESULT CALLED TO, READ BACK BY AND VERIFIED WITH: V BRYK PHARMD 07/10/20 0244 JDW    Streptococcus agalactiae NOT DETECTED NOT DETECTED Final   Streptococcus pneumoniae NOT DETECTED NOT DETECTED Final   Streptococcus pyogenes NOT DETECTED NOT DETECTED Final   A.calcoaceticus-baumannii NOT DETECTED NOT DETECTED Final   Bacteroides fragilis NOT DETECTED NOT DETECTED Final   Enterobacterales NOT DETECTED NOT DETECTED Final   Enterobacter cloacae complex NOT DETECTED NOT DETECTED Final   Escherichia coli NOT DETECTED NOT DETECTED Final   Klebsiella aerogenes NOT DETECTED NOT DETECTED Final   Klebsiella oxytoca NOT DETECTED NOT DETECTED Final   Klebsiella pneumoniae NOT DETECTED NOT DETECTED Final   Proteus species NOT DETECTED NOT DETECTED Final   Salmonella species NOT DETECTED NOT DETECTED Final   Serratia marcescens NOT DETECTED NOT DETECTED Final   Haemophilus influenzae NOT DETECTED NOT DETECTED Final   Neisseria meningitidis NOT DETECTED NOT DETECTED Final   Pseudomonas aeruginosa NOT DETECTED NOT DETECTED Final   Stenotrophomonas maltophilia NOT DETECTED NOT DETECTED Final   Candida albicans NOT DETECTED NOT DETECTED Final   Candida auris NOT DETECTED NOT DETECTED Final   Candida glabrata NOT DETECTED NOT DETECTED Final   Candida krusei NOT DETECTED NOT DETECTED Final   Candida parapsilosis NOT DETECTED NOT DETECTED Final   Candida tropicalis NOT DETECTED NOT DETECTED Final   Cryptococcus neoformans/gattii NOT DETECTED NOT DETECTED Final    Comment: Performed at [facility]  Lab, [address] [place], [place] [address]  SARS Coronavirus 2 by RT PCR (hospital order, performed in [facility] hospital lab) Nasopharyngeal Nasopharyngeal Swab     Status: None   Collection Time: 07/14/20  5:33 AM   Specimen: Nasopharyngeal Swab  Result Value Ref Range Status   SARS Coronavirus 2 NEGATIVE NEGATIVE Final    Comment: (NOTE) SARS-CoV-2 target nucleic acids are NOT DETECTED.  The SARS-CoV-2 RNA is generally detectable in upper and lower respiratory specimens during the acute phase of infection. The lowest concentration of SARS-CoV-2 viral copies this assay can detect is 250 copies / mL. A negative result does not preclude SARS-CoV-2 infection and should not be used as the sole basis for treatment or other patient management decisions.  A negative result may occur with improper specimen collection / handling, submission of specimen other than nasopharyngeal swab, presence of viral mutation(s) within the areas targeted by this assay, and inadequate number of viral copies (<250 copies / mL). A negative result must be combined with clinical observations, patient history, and epidemiological information.  Fact Sheet for Patients:   StrictlyIdeas.no  Fact Sheet for Healthcare Providers: BankingDealers.co.za  This test is not yet approved or  cleared by the [place] FDA and has been authorized for detection and/or diagnosis of SARS-CoV-2 by FDA under an Emergency Use Authorization (EUA).  This EUA will remain in effect (meaning this test can be used) for  the duration of the COVID-19 declaration under Section 564(b)(1) of the Act, 21 U.S.C. section 360bbb-3(b)(1), unless the authorization is terminated or revoked sooner.  Performed at [facility], [place] [address] [place], [place] [address]      Radiology Studies: No results found.  Scheduled Meds:  (feeding supplement) PROSource Plus  30 mL Oral BID BM    allopurinol  100 mg Oral Daily   amLODipine  10 mg Oral Daily   aspirin EC  81 mg Oral Daily   atorvastatin  80 mg Oral QPM   calcitRIOL  0.5 mcg Oral Daily   clopidogrel  75 mg Oral Daily   enoxaparin (LOVENOX) injection  30 mg Subcutaneous Q24H   fluticasone  1 spray Each Nare Daily   gabapentin  100 mg Oral TID   influenza vaccine adjuvanted  0.5 mL Intramuscular Tomorrow-1000   insulin aspart  0-20 Units Subcutaneous TID WC   insulin aspart  0-5 Units Subcutaneous QHS   insulin aspart  8 Units Subcutaneous TID WC   insulin glargine  30 Units Subcutaneous Daily   levalbuterol  0.63 mg Nebulization Once   methylPREDNISolone (SOLU-MEDROL) injection  40 mg Intravenous Q12H   metoprolol tartrate  100 mg Oral BID   mirabegron ER  25 mg Oral Daily   multivitamin with minerals  1 tablet Oral Daily   pantoprazole  40 mg Oral Daily   sodium bicarbonate  650 mg Oral BID   sodium chloride flush  3 mL Intravenous Q12H   timolol  1 drop Both Eyes Daily   Continuous Infusions:  lactated ringers 100 mL/hr at 07/14/20 0455     LOS: 8 days   Marylu Lund, MD [facility] Pager On Amion  If 7PM-7AM, please contact night-coverage 07/17/2020, 4:09 PM

## 2020-07-17 NOTE — Progress Notes (Signed)
Inpatient Diabetes Program Recommendations  AACE/ADA: New Consensus Statement on Inpatient Glycemic Control (2015)  Target Ranges:  Prepandial:   less than 140 mg/dL      Peak postprandial:   less than 180 mg/dL (1-2 hours)      Critically ill patients:  140 - 180 mg/dL   Lab Results  Component Value Date   GLUCAP 282 (H) 07/17/2020   HGBA1C 7.8 (H) 06/26/2020    Review of Glycemic Control Results for [name], [name] (MRN [id number]) as of 07/17/2020 09:38  Ref. Range 07/16/2020 11:45 07/16/2020 17:07 07/16/2020 20:07 07/17/2020 06:52  Glucose-Capillary Latest Ref Range: 70 - 99 mg/dL 233 (H) 239 (H) 254 (H) 282 (H)   Diabetes history: Type 2 DM Outpatient Diabetes medications: Lantus 15 units QD, Novolog 0-9 units TID Current orders for Inpatient glycemic control: Lantus 30 units QD, Novolog 8 units TID, Novolog 0-20 units TID, Novolog 0-5 units QHS  Inpatient Diabetes Program Recommendations:    Consider increasing Lantus to 36 units QD.   Thanks, Bronson Curb, MSN, RNC-OB Diabetes Coordinator [phone] (8a-5p)

## 2020-07-18 DIAGNOSIS — I10 Essential (primary) hypertension: Secondary | ICD-10-CM

## 2020-07-18 LAB — GLUCOSE, CAPILLARY
Glucose-Capillary: 280 mg/dL — ABNORMAL HIGH (ref 70–99)
Glucose-Capillary: 229 mg/dL — ABNORMAL HIGH (ref 70–99)
Glucose-Capillary: 249 mg/dL — ABNORMAL HIGH (ref 70–99)
Glucose-Capillary: 241 mg/dL — ABNORMAL HIGH (ref 70–99)

## 2020-07-18 NOTE — Progress Notes (Signed)
Occupational Therapy Treatment Patient Details Name: [name] MRN: [id number] DOB: Oct 23, 1946 Today's Date: 07/18/2020    History of present illness 74 year old female with past medical history notable for peripheral vascular disease, hypertension, hyperlipidemia, type 2 diabetes mellitus, CKD, CAD who presented to the ED with shortness of breath.  Found to have sepsis due to left lower extremity cellulitis.   OT comments  Pt. To be seen for self care and ADL transfers. Pt. Was ed on use of AE for LE ADLs and for energy conservation. Pt. Was ed to not bed over for ADLs secondary to SOB and for energy conservation. Acute OT to follow.   Follow Up Recommendations  SNF;Supervision/Assistance - 24 hour    Equipment Recommendations  3 in 1 bedside commode;Wheelchair (measurements OT);Wheelchair cushion (measurements OT)    Recommendations for Other Services      Precautions / Restrictions Precautions Precautions: Fall;Other (comment) Precaution Comments: watch O2 Restrictions Weight Bearing Restrictions: No       Mobility Bed Mobility                  Transfers   Equipment used: Rolling walker (2 wheeled)   Sit to Stand: Supervision Stand pivot transfers: Min guard            Balance Overall balance assessment: Needs assistance Sitting-balance support: Feet supported Sitting balance-Leahy Scale: Good       Standing balance-Leahy Scale: Poor                             ADL either performed or assessed with clinical judgement   ADL Overall ADL's : Needs assistance/impaired Eating/Feeding: Independent   Grooming: Set up;Sitting   Upper Body Bathing: Minimal assistance;Sitting   Lower Body Bathing: Moderate assistance;Sit to/from stand   Upper Body Dressing : Minimal assistance;Sitting   Lower Body Dressing: Moderate assistance;Sit to/from stand;With adaptive equipment   Toilet Transfer: Minimal assistance;Stand-pivot    Toileting- Clothing Manipulation and Hygiene: Total assistance;Sit to/from stand       Functional mobility during ADLs: Minimal assistance;Rolling walker General ADL Comments: Pt. ed on use of AE to increase ability with ADLs and for energy conservation      Vision   Vision Assessment?: No apparent visual deficits   Perception     Praxis      Cognition Arousal/Alertness: Awake/alert Behavior During Therapy: WFL for tasks assessed/performed Overall Cognitive Status: Within Functional Limits for tasks assessed                                          Exercises     Shoulder Instructions       General Comments      Pertinent Vitals/ Pain       Pain Assessment: 0-10 Pain Score: 5  Pain Location: l hip Pain Descriptors / Indicators: Aching Pain Intervention(s): Monitored during session (Pt. does not want pain meds)  Home Living                                          Prior Functioning/Environment              Frequency  Min 2X/week        Progress Toward Goals  OT Goals(current  goals can now be found in the care plan section)  Progress towards OT goals: Progressing toward goals  Acute Rehab OT Goals Patient Stated Goal: to breathe better, open to rehab OT Goal Formulation: With patient Time For Goal Achievement: 07/26/20 Potential to Achieve Goals: Good ADL Goals Pt Will Perform Grooming: with min guard assist;standing Pt Will Perform Lower Body Bathing: with min assist;sit to/from stand;sitting/lateral leans;with adaptive equipment Pt Will Perform Lower Body Dressing: with min assist;sitting/lateral leans;sit to/from stand;with adaptive equipment Pt Will Transfer to Toilet: with min assist;ambulating;regular height toilet Pt Will Perform Tub/Shower Transfer: with min assist;ambulating;shower seat;rolling walker Additional ADL Goal #1: Pt will recall and apply 3-5 ECS strategies to ADL routine Additional ADL Goal  #2: Pt will self monitor and maintain SpO2 >88% with use of pursed lip breathing and rest breaks  Plan Discharge plan needs to be updated    Co-evaluation                 AM-PAC OT "6 Clicks" Daily Activity     Outcome Measure   Help from another person eating meals?: A Little Help from another person taking care of personal grooming?: A Little Help from another person toileting, which includes using toliet, bedpan, or urinal?: A Lot Help from another person bathing (including washing, rinsing, drying)?: A Lot Help from another person to put on and taking off regular upper body clothing?: A Little Help from another person to put on and taking off regular lower body clothing?: A Lot 6 Click Score: 15    End of Session Equipment Utilized During Treatment: Rolling walker;Oxygen  OT Visit Diagnosis: Unsteadiness on feet (R26.81);Other abnormalities of gait and mobility (R26.89);Muscle weakness (generalized) (M62.81)   Activity Tolerance Patient tolerated treatment well   Patient Left in chair;with call bell/phone within reach   Nurse Communication  (ok to work with pt.)        Time: 4492-0100 OT Time Calculation (min): 28 min  Charges: OT General Charges $OT Visit: 1 Visit OT Treatments $Self Care/Home Management : 23-37 mins  [name] OT/L    [name] 07/18/2020, 1:56 PM

## 2020-07-18 NOTE — Progress Notes (Signed)
PROGRESS NOTE    [name]  RSW:[id number] DOB: 1946-03-19 DOA: 07/09/2020 PCP: [name], MD    Brief Narrative:  74 year old female with past medical history notable for peripheral vascular disease, hypertension, hyperlipidemia, type 2 diabetes mellitus, CKD, CAD who presented to the ED with shortness of breath.  Patient has been feeling ill, with weakness, myalgias over the past few days.  She was seen by her PCP, Dr. [name] on 06/26/2020 and provided antibiotics.   In the ED, physical exam notable for left lower extremity cellulitis with multiple skin breaks and ulcerations.  WBC elevated 16.4. TRH was consulted for admission for failed outpatient antibiotic therapy for cellulitis.  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  Recently started on outpatient antibiotics for lower extremity cellulitis without improvement.  On arrival to the ED, patient was noted to have elevated WBC count of 16.4, lactic acid of 2.3, CRP 27.7 with a procalcitonin of 59.95.  Blood cultures x2+ for gram-positive cocci in chains with BCID notable for Streptococcus species.  TTE with LVEF 50-60%, normal LV function, normal RV systolic function, no evidence of valvular vegetations on TTE. --Pt completed 7 day course of antibiotics per ID, now signed off --Supportive care, antipyretics as needed --Procalcitonin had been steadily trending down  Acute  hypoxic respiratory failure Likely history of COPD, undiagnosed --TTE with normal LVEF.  Chest x-ray with mild vascular congestion and basilar atelectasis. D-dimer elevated 2.59.  Vascular duplex ultrasound bilateral lower extremities negative for DVT. --Unable to perform CT angiogram chest 2/2 CKD, check NM VQ scan. --VQ unremarkable for acute PE --Wheezing on exam with decreased BS in the setting of past tobacco abuse x 79yrs. Pt did quit smoking in 2009 (50 pack year smoking hx) --Continue to titrate supple oxygen to maintain SPO2 greater than 88%, --Incentive spirometry q1h while awake --Continue to wean O2 as tolerated --Still with increased sob this AM. On chart review, no PRN nebs were given since 9/19. 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  Case was discussed with vascular surgery, Dr. Lurline Hare by admitting physician who recommended venous reflux study and placement of Unna boots by wound care if positive, unfortunately per ultrasound technician, only perform that exam outpatient.  Vascular ultrasound ABI demonstrating moderate arterial disease left lower extremity. --Likely will need close follow-up with vascular surgery after resolution of septicemia outpatient --Vascular Navigator following  CKD stage V (baseline Cr 2.6-2.8) --Continued with renal diet --Calcitriol --sodium bicarb 650mg  BID --Avoid nephrotoxins, renally dose all medications --Repeat bmet in AM  Type 2 diabetes mellitus Hemoglobin A1c 7.8 on 06/26/2020.  -See above, as high dosed steroids are ordered, thus increased lantus to 30 units daily with addition of 8 units meal coverage -Have increased SSI to resistant scale -Glycemic trends overall stable at this  time  Hyperlipidemia: Continue atorvastatin 80 mg p.o. daily as pt  tolerates  PVD/CAD --Continue aspirin 81 mg p.o. daily, Plavix 75 mg p.o. daily as pt tolerates  Essential hypertension --Continue with amlodipine 10mg  p.o. daily, metoprolol 100mg  BID -- Cont current regimen  Chronic pain syndrome --Continue Zanaflex, Ultram  Morbid obesity BMI 43.8.   -Recommend diet/lifestyle modification -Outpatient follow-up with PCP, considerations of bariatric medicine outpatient referral.  DVT prophylaxis: Lovenox subq Code Status: Full Family Communication: Pt in room, family not at bedside  Status is: Inpatient  Remains inpatient appropriate because:Hemodynamically unstable, Unsafe d/c plan, IV treatments appropriate due to intensity of illness or inability to take PO and Inpatient level of care appropriate due to severity of illness   Dispo:  Patient From: Home  Planned Disposition: [place]  Expected discharge date: 07/18/20  Medically stable for discharge: No    Consultants:   ID  Vascular Surgery  Procedures:  TTE IMPRESSIONS  1. Left ventricular ejection fraction, by estimation, is 55 to 60%. The  left ventricle has normal function. The left ventricle has no regional  wall motion abnormalities. Left ventricular diastolic function could not  be evaluated.  2. Right ventricular systolic function is normal. The right ventricular  size is normal. There is normal pulmonary artery systolic pressure. The  estimated right ventricular systolic pressure is 33.3 mmHg.  3. The mitral valve is grossly normal. Mild to moderate mitral valve  regurgitation. No evidence of mitral stenosis.  4. The aortic valve is tricuspid. There is mild calcification of the  aortic valve. There is mild thickening of the aortic valve. Aortic valve  regurgitation is not visualized. Mild aortic valve sclerosis is present,  with no evidence of aortic valve  stenosis.  5. The inferior vena cava is normal in size with greater than 50%   respiratory variability, suggesting right atrial pressure of 3 mmHg.   Vascular ultrasound ABI: Summary:  Right: Resting right ankle-brachial index is within normal range. No  evidence of significant right lower extremity arterial disease. The right  toe-brachial index is abnormal.   Left: Resting left ankle-brachial index indicates moderate left lower  extremity arterial disease. The left toe-brachial index is abnormal.    Antimicrobials: Anti-infectives (From admission, onward)   Start     Dose/Rate Route Frequency Ordered Stop   07/13/20 1400  amoxicillin (AMOXIL) capsule 500 mg        500 mg Oral Every 12 hours 07/13/20 1121 07/14/20 2234   07/12/20 1800  penicillin G potassium 6 Million Units in dextrose 5 % 250 mL continuous infusion  Status:  Discontinued        6 Million Units 20.8 mL/hr over 12 Hours Intravenous Every 12 hours 07/12/20 1332 07/13/20 1121   07/11/20 1900  vancomycin (VANCOREADY) IVPB 1500 mg/300 mL  Status:  Discontinued        1,500 mg 150 mL/hr over 120 Minutes Intravenous Every 48 hours 07/09/20 1815 07/10/20 0301   07/10/20 1800  cefTRIAXone (ROCEPHIN) 2 g in sodium chloride 0.9 % 100 mL IVPB  Status:  Discontinued        2 g 200 mL/hr over 30 Minutes Intravenous Every 24 hours 07/10/20 0301 07/12/20 1332   07/10/20 0000  piperacillin-tazobactam (ZOSYN) IVPB 2.25 g  Status:  Discontinued        2.25 g 100 mL/hr over 30 Minutes Intravenous Every 6 hours 07/09/20 1815 07/10/20 0301   07/09/20 1830  vancomycin (VANCOREADY) IVPB 2000 mg/400 mL  2,000 mg 200 mL/hr over 120 Minutes Intravenous  Once 07/09/20 1752 07/09/20 2143   07/09/20 1800  piperacillin-tazobactam (ZOSYN) IVPB 3.375 g        3.375 g 100 mL/hr over 30 Minutes Intravenous  Once 07/09/20 1752 07/09/20 1926   07/09/20 1230  ceFAZolin (ANCEF) IVPB 2g/100 mL premix        2 g 200 mL/hr over 30 Minutes Intravenous  Once 07/09/20 1216 07/09/20 1315   07/09/20 1200  ceFAZolin (ANCEF)  IVPB 1 g/50 mL premix  Status:  Discontinued        1 g 100 mL/hr over 30 Minutes Intravenous  Once 07/09/20 1156 07/09/20 1216      Subjective: Reports breathing better today  Objective: Vitals:   07/17/20 1433 07/17/20 2100 07/18/20 0500 07/18/20 0847  BP: (!) 147/73 140/66 (!) 146/76 (!) 143/77  Pulse: 70 75 70 77  Resp: 17 18 17 18   Temp: 97.8 F (36.6 C) 97.8 F (36.6 C) 98 F (36.7 C) (!) 97.5 F (36.4 C)  TempSrc: Oral Oral Oral Oral  SpO2: 95% 95% 96% 95%  Weight:      Height:        Intake/Output Summary (Last 24 hours) at 07/18/2020 1503 Last data filed at 07/18/2020 0419 Gross per 24 hour  Intake 480 ml  Output 1100 ml  Net -620 ml   Filed Weights   07/10/20 1531 07/17/20 0805  Weight: 104.8 kg 104.8 kg    Examination: General exam: Conversant, in no acute distress Respiratory system: Increased resp effort, decreased BS Cardiovascular system: regular rhythm, s1-s2 Gastrointestinal system: Nondistended, nontender, pos BS Central nervous system: No seizures, no tremors Extremities: No cyanosis, no joint deformities Skin: No rashes, no pallor Psychiatry: Affect normal // no auditory hallucinations   Data Reviewed: I have personally reviewed following labs and imaging studies  CBC: Recent Labs  Lab 07/13/20 0210 07/14/20 0832 07/15/20 1006 07/16/20 0505 07/17/20 0453  WBC 13.2* 15.5* 16.0* 12.3* 13.7*  NEUTROABS  --   --  11.4*  --   --   HGB 10.5* 10.7* 10.4* 10.4* 9.6*  HCT 32.6* 32.3* 31.7* 31.3* 28.5*  MCV 85.6 83.7 82.1 81.7 82.1  PLT 290 397 483* 471* 856*   Basic Metabolic Panel: Recent Labs  Lab 07/12/20 0635 07/12/20 0635 07/13/20 0210 07/14/20 0832 07/15/20 1006 07/16/20 0505 07/17/20 0453  NA 136   < > 136 138 138 137 134*  K 4.2   < > 3.9 3.6 3.5 4.9 4.4  CL 103   < > 101 102 102 104 98  CO2 21*   < > 23 24 22  17* 21*  GLUCOSE 166*   < > 154* 168* 157* 195* 317*  BUN 42*   < > 44* 43* 41* 47* 58*  CREATININE 2.54*   < >  2.65* 2.38* 2.36* 2.41* 2.58*  CALCIUM 8.5*   < > 8.8* 9.3 9.2 8.8* 8.7*  MG 2.2  --  2.2 2.0 2.0  --   --    < > = values in this interval not displayed.   GFR: Estimated Creatinine Clearance: 20.7 mL/min (A) (by C-G formula based on SCr of 2.58 mg/dL (H)). Liver Function Tests: Recent Labs  Lab 07/16/20 0505 07/17/20 0453  AST 27 21  ALT 22 21  ALKPHOS 147* 132*  BILITOT 1.2 0.6  PROT 6.4* 6.2*  ALBUMIN 2.0* 2.1*   No results for input(s): LIPASE, AMYLASE in the last 168 hours. No results for  input(s): AMMONIA in the last 168 hours. Coagulation Profile: No results for input(s): INR, PROTIME in the last 168 hours. Cardiac Enzymes: No results for input(s): CKTOTAL, CKMB, CKMBINDEX, TROPONINI in the last 168 hours. BNP (last 3 results) No results for input(s): PROBNP in the last 8760 hours. HbA1C: No results for input(s): HGBA1C in the last 72 hours. CBG: Recent Labs  Lab 07/17/20 1155 07/17/20 1709 07/17/20 2057 07/18/20 0654 07/18/20 1151  GLUCAP 241* 287* 263* 280* 229*   Lipid Profile: No results for input(s): CHOL, HDL, LDLCALC, TRIG, CHOLHDL, LDLDIRECT in the last 72 hours. Thyroid Function Tests: No results for input(s): TSH, T4TOTAL, FREET4, T3FREE, THYROIDAB in the last 72 hours. Anemia Panel: No results for input(s): VITAMINB12, FOLATE, FERRITIN, TIBC, IRON, RETICCTPCT in the last 72 hours. Sepsis Labs: Recent Labs  Lab 07/14/20 0832 07/15/20 1006 07/16/20 0505 07/17/20 0453  PROCALCITON 6.57 3.80 2.33 1.68    Recent Results (from the past 240 hour(s))  Blood Culture (routine x 2)     Status: Abnormal   Collection Time: 07/09/20 12:40 PM   Specimen: BLOOD RIGHT HAND  Result Value Ref Range Status   Specimen Description BLOOD RIGHT HAND  Final   Special Requests   Final    BOTTLES DRAWN AEROBIC AND ANAEROBIC Blood Culture results may not be optimal due to an inadequate volume of blood received in culture bottles   Culture  Setup Time   Final     AEROBIC BOTTLE ONLY GRAM POSITIVE COCCI IN CHAINS CRITICAL VALUE NOTED.  VALUE IS CONSISTENT WITH PREVIOUSLY REPORTED AND CALLED VALUE. Performed at [facility], [place] [address] [place], [place] [address]    Culture STREPTOCOCCUS GROUP G (A)  Final   Report Status 07/12/2020 FINAL  Final   Organism ID, Bacteria STREPTOCOCCUS GROUP G  Final      Susceptibility   Streptococcus group g - MIC*    CLINDAMYCIN <=0.25 SENSITIVE Sensitive     AMPICILLIN <=0.25 SENSITIVE Sensitive     ERYTHROMYCIN <=0.12 SENSITIVE Sensitive     VANCOMYCIN 0.5 SENSITIVE Sensitive     CEFTRIAXONE <=0.12 SENSITIVE Sensitive     LEVOFLOXACIN 0.5 SENSITIVE Sensitive     PENICILLIN Value in next row Sensitive      SENSITIVEBenzylpenicillin=0.06S    * STREPTOCOCCUS GROUP G  Blood Culture (routine x 2)     Status: Abnormal   Collection Time: 07/09/20 12:45 PM   Specimen: BLOOD LEFT HAND  Result Value Ref Range Status   Specimen Description BLOOD LEFT HAND  Final   Special Requests   Final    BOTTLES DRAWN AEROBIC AND ANAEROBIC Blood Culture adequate volume   Culture  Setup Time   Final    IN BOTH AEROBIC AND ANAEROBIC BOTTLES GRAM POSITIVE COCCI IN CHAINS Organism ID to follow CRITICAL RESULT CALLED TO, READ BACK BY AND VERIFIED WITH: V BRYK 07/10/20 0244 JDW    Culture (A)  Final    STREPTOCOCCUS GROUP G SUSCEPTIBILITIES PERFORMED ON PREVIOUS CULTURE WITHIN THE LAST 5 DAYS. Performed at [facility], [place] [address] [place], [place] [address]    Report Status 07/12/2020 FINAL  Final  Blood Culture ID Panel (Reflexed)     Status: Abnormal   Collection Time: 07/09/20 12:45 PM  Result Value Ref Range Status   Enterococcus faecalis NOT DETECTED NOT DETECTED Final   Enterococcus Faecium NOT DETECTED NOT DETECTED Final   Listeria monocytogenes NOT DETECTED NOT DETECTED Final   Staphylococcus species NOT DETECTED  NOT DETECTED Final   Staphylococcus aureus (BCID) NOT DETECTED NOT DETECTED Final    Staphylococcus epidermidis NOT DETECTED NOT DETECTED Final   Staphylococcus lugdunensis NOT DETECTED NOT DETECTED Final   Streptococcus species DETECTED (A) NOT DETECTED Final    Comment: Not Enterococcus species, Streptococcus agalactiae, Streptococcus pyogenes, or Streptococcus pneumoniae. CRITICAL RESULT CALLED TO, READ BACK BY AND VERIFIED WITH: V BRYK PHARMD 07/10/20 0244 JDW    Streptococcus agalactiae NOT DETECTED NOT DETECTED Final   Streptococcus pneumoniae NOT DETECTED NOT DETECTED Final   Streptococcus pyogenes NOT DETECTED NOT DETECTED Final   A.calcoaceticus-baumannii NOT DETECTED NOT DETECTED Final   Bacteroides fragilis NOT DETECTED NOT DETECTED Final   Enterobacterales NOT DETECTED NOT DETECTED Final   Enterobacter cloacae complex NOT DETECTED NOT DETECTED Final   Escherichia coli NOT DETECTED NOT DETECTED Final   Klebsiella aerogenes NOT DETECTED NOT DETECTED Final   Klebsiella oxytoca NOT DETECTED NOT DETECTED Final   Klebsiella pneumoniae NOT DETECTED NOT DETECTED Final   Proteus species NOT DETECTED NOT DETECTED Final   Salmonella species NOT DETECTED NOT DETECTED Final   Serratia marcescens NOT DETECTED NOT DETECTED Final   Haemophilus influenzae NOT DETECTED NOT DETECTED Final   Neisseria meningitidis NOT DETECTED NOT DETECTED Final   Pseudomonas aeruginosa NOT DETECTED NOT DETECTED Final   Stenotrophomonas maltophilia NOT DETECTED NOT DETECTED Final   Candida albicans NOT DETECTED NOT DETECTED Final   Candida auris NOT DETECTED NOT DETECTED Final   Candida glabrata NOT DETECTED NOT DETECTED Final   Candida krusei NOT DETECTED NOT DETECTED Final   Candida parapsilosis NOT DETECTED NOT DETECTED Final   Candida tropicalis NOT DETECTED NOT DETECTED Final   Cryptococcus neoformans/gattii NOT DETECTED NOT DETECTED Final    Comment: Performed at [facility], [address] [place], [place] [address]  SARS Coronavirus 2 by RT PCR (hospital order, performed in  [facility] hospital lab) Nasopharyngeal Nasopharyngeal Swab     Status: None   Collection Time: 07/14/20  5:33 AM   Specimen: Nasopharyngeal Swab  Result Value Ref Range Status   SARS Coronavirus 2 NEGATIVE NEGATIVE Final    Comment: (NOTE) SARS-CoV-2 target nucleic acids are NOT DETECTED.  The SARS-CoV-2 RNA is generally detectable in upper and lower respiratory specimens during the acute phase of infection. The lowest concentration of SARS-CoV-2 viral copies this assay can detect is 250 copies / mL. A negative result does not preclude SARS-CoV-2 infection and should not be used as the sole basis for treatment or other patient management decisions.  A negative result may occur with improper specimen collection / handling, submission of specimen other than nasopharyngeal swab, presence of viral mutation(s) within the areas targeted by this assay, and inadequate number of viral copies (<250 copies / mL). A negative result must be combined with clinical observations, patient history, and epidemiological information.  Fact Sheet for Patients:   StrictlyIdeas.no  Fact Sheet for Healthcare Providers: BankingDealers.co.za  This test is not yet approved or  cleared by the [place] FDA and has been authorized for detection and/or diagnosis of SARS-CoV-2 by FDA under an Emergency Use Authorization (EUA).  This EUA will remain in effect (meaning this test can be used) for the duration of the COVID-19 declaration under Section 564(b)(1) of the Act, 21 U.S.C. section 360bbb-3(b)(1), unless the authorization is terminated or revoked sooner.  Performed at [facility], [place] [address] [place], [place] [address]      Radiology Studies: No results found.  Scheduled Meds: . (feeding supplement) PROSource Plus  30 mL Oral BID BM  . allopurinol  100 mg Oral Daily  . amLODipine  10 mg Oral Daily  . aspirin EC  81 mg Oral Daily  .  atorvastatin  80 mg Oral QPM  . calcitRIOL  0.5 mcg Oral Daily  . clopidogrel  75 mg Oral Daily  . enoxaparin (LOVENOX) injection  30 mg Subcutaneous Q24H  . fluticasone  1 spray Each Nare Daily  . gabapentin  100 mg Oral TID  . influenza vaccine adjuvanted  0.5 mL Intramuscular Tomorrow-1000  . insulin aspart  0-20 Units Subcutaneous TID WC  . insulin aspart  0-5 Units Subcutaneous QHS  . insulin aspart  8 Units Subcutaneous TID WC  . insulin glargine  30 Units Subcutaneous Daily  . levalbuterol  0.63 mg Nebulization Once  . methylPREDNISolone (SOLU-MEDROL) injection  40 mg Intravenous Q12H  . metoprolol tartrate  100 mg Oral BID  . mirabegron ER  25 mg Oral Daily  . multivitamin with minerals  1 tablet Oral Daily  . pantoprazole  40 mg Oral Daily  . sodium bicarbonate  650 mg Oral BID  . sodium chloride flush  3 mL Intravenous Q12H  . timolol  1 drop Both Eyes Daily   Continuous Infusions: . lactated ringers 100 mL/hr at 07/18/20 0419     LOS: 9 days   Marylu Lund, MD [facility] Pager On Amion  If 7PM-7AM, please contact night-coverage 07/18/2020, 3:03 PM

## 2020-07-19 ENCOUNTER — Other Ambulatory Visit: Payer: Self-pay | Admitting: Internal Medicine

## 2020-07-19 LAB — COMPREHENSIVE METABOLIC PANEL
ALT: 29 U/L (ref 0–44)
AST: 28 U/L (ref 15–41)
Albumin: 2.2 g/dL — ABNORMAL LOW (ref 3.5–5.0)
Alkaline Phosphatase: 128 U/L — ABNORMAL HIGH (ref 38–126)
Anion gap: 12 (ref 5–15)
BUN: 80 mg/dL — ABNORMAL HIGH (ref 8–23)
CO2: 20 mmol/L — ABNORMAL LOW (ref 22–32)
Calcium: 8.8 mg/dL — ABNORMAL LOW (ref 8.9–10.3)
Chloride: 102 mmol/L (ref 98–111)
Creatinine, Ser: 2.42 mg/dL — ABNORMAL HIGH (ref 0.44–1.00)
GFR calc Af Amer: 22 mL/min — ABNORMAL LOW (ref >60)
GFR calc non Af Amer: 19 mL/min — ABNORMAL LOW (ref >60)
Glucose, Bld: 225 mg/dL — ABNORMAL HIGH (ref 70–99)
Potassium: 4.9 mmol/L (ref 3.5–5.1)
Sodium: 134 mmol/L — ABNORMAL LOW (ref 135–145)
Total Bilirubin: 0.5 mg/dL (ref 0.3–1.2)
Total Protein: 5.9 g/dL — ABNORMAL LOW (ref 6.5–8.1)

## 2020-07-19 LAB — GLUCOSE, CAPILLARY
Glucose-Capillary: 225 mg/dL — ABNORMAL HIGH (ref 70–99)
Glucose-Capillary: 240 mg/dL — ABNORMAL HIGH (ref 70–99)
Glucose-Capillary: 256 mg/dL — ABNORMAL HIGH (ref 70–99)
Glucose-Capillary: 152 mg/dL — ABNORMAL HIGH (ref 70–99)

## 2020-07-19 MED ORDER — METHYLPREDNISOLONE SODIUM SUCC 40 MG IJ SOLR
40.0000 mg | Freq: Two times a day (BID) | INTRAMUSCULAR | Status: DC
  Administered 2020-07-19: 40 mg via INTRAVENOUS
  Filled 2020-07-19: qty 1

## 2020-07-19 NOTE — Plan of Care (Signed)
  Problem: Education: Goal: Knowledge of General Education information will improve Description: Including pain rating scale, medication(s)/side effects and non-pharmacologic comfort measures Outcome: Progressing   Problem: Health Behavior/Discharge Planning: Goal: Ability to manage health-related needs will improve Outcome: Progressing   Problem: Clinical Measurements: Goal: Ability to maintain clinical measurements within normal limits will improve Outcome: Progressing   Problem: Elimination: Goal: Will not experience complications related to bowel motility Outcome: Progressing Goal: Will not experience complications related to urinary retention Outcome: Progressing   

## 2020-07-19 NOTE — Plan of Care (Signed)
  Problem: Pain Managment: Goal: General experience of comfort will improve Outcome: Progressing   Problem: Safety: Goal: Ability to remain free from injury will improve Outcome: Progressing   Problem: Skin Integrity: Goal: Risk for impaired skin integrity will decrease Outcome: Progressing   

## 2020-07-19 NOTE — Progress Notes (Signed)
PROGRESS NOTE    [name]  MWU:[id number] DOB: 03-16-1946 DOA: 07/09/2020 PCP: [name], MD    Brief Narrative:  74 year old female with past medical history notable for peripheral vascular disease, hypertension, hyperlipidemia, type 2 diabetes mellitus, CKD, CAD who presented to the ED with shortness of breath.  Patient has been feeling ill, with weakness, myalgias over the past few days.  She was seen by her PCP, Dr. [name] on 06/26/2020 and provided antibiotics.   In the ED, physical exam notable for left lower extremity cellulitis with multiple skin breaks and ulcerations.  WBC elevated 16.4. TRH was consulted for admission for failed outpatient antibiotic therapy for cellulitis.  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  Recently started on outpatient antibiotics for lower extremity cellulitis without improvement.  On arrival to the ED, patient was noted to have elevated WBC count of 16.4, lactic acid of 2.3, CRP 27.7 with a procalcitonin of 59.95.  Blood cultures x2+ for gram-positive cocci in chains with BCID notable for Streptococcus species.  TTE with LVEF 50-60%, normal LV function, normal RV systolic function, no evidence of valvular vegetations on TTE. --Pt completed 7 day course of antibiotics per ID, now signed off --Supportive care, antipyretics as needed --Procalcitonin had been steadily trending down  Acute  hypoxic respiratory failure Likely history of COPD, undiagnosed --TTE with normal LVEF.  Chest x-ray with mild vascular congestion and basilar atelectasis. D-dimer elevated 2.59.  Vascular duplex ultrasound bilateral lower extremities negative for DVT. --Unable to perform CT angiogram chest 2/2 CKD, check NM VQ scan. --VQ unremarkable for acute PE --Wheezing on exam with decreased BS in the setting of past tobacco abuse x 24yrs. Pt did quit smoking in 2009 (50 pack year smoking hx) --Continue to titrate supple oxygen to maintain SPO2 greater than 88%, --Incentive spirometry q1h while awake --Continue to wean O2 as tolerated --Improving with neb tx and steroids. Will begin weaning steroids. 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  Case was discussed with vascular surgery, Dr. Lurline Hare by admitting physician who recommended venous reflux study and placement of Unna boots by wound care if positive, unfortunately per ultrasound technician, only perform that exam outpatient.  Vascular ultrasound ABI demonstrating moderate arterial disease left lower extremity. --Likely will need close follow-up with vascular surgery after resolution of septicemia outpatient --Vascular Navigator following  CKD stage V (baseline Cr 2.6-2.8) --Continued with renal diet --Calcitriol --sodium bicarb 650mg  BID --Avoid nephrotoxins, renally dose all medications --Recheck bmet in AM  Type 2 diabetes mellitus Hemoglobin A1c 7.8 on 06/26/2020.  -See above, as high dosed steroids are ordered, thus increased lantus to 30 units daily with addition of 8 units meal coverage -Have increased SSI to resistant scale -Glycemic trends overall stable at this time  Hyperlipidemia: Continue  atorvastatin 80 mg p.o. daily as pt tolerates  PVD/CAD --Continue  aspirin 81 mg p.o. daily, Plavix 75 mg p.o. daily as pt tolerates  Essential hypertension --Continue with amlodipine 10mg  p.o. daily, metoprolol 100mg  BID -- Cont current regimen  Chronic pain syndrome --Continue Zanaflex, Ultram  Morbid obesity BMI 43.8.   -Recommend diet/lifestyle modification -Outpatient follow-up with PCP, considerations of bariatric medicine outpatient referral.  DVT prophylaxis: Lovenox subq Code Status: Full Family Communication: Pt in room, family not at bedside but did update family over phone recently  Status is: Inpatient  Remains inpatient appropriate because:Hemodynamically unstable, Unsafe d/c plan, IV treatments appropriate due to intensity of illness or inability to take PO and Inpatient level of care appropriate due to severity of illness   Dispo:  Patient From: Home  Planned Disposition: [place]  Expected discharge date: 07/18/20  Medically stable for discharge: No    Consultants:   ID  Vascular Surgery  Procedures:  TTE IMPRESSIONS  1. Left ventricular ejection fraction, by estimation, is 55 to 60%. The  left ventricle has normal function. The left ventricle has no regional  wall motion abnormalities. Left ventricular diastolic function could not  be evaluated.  2. Right ventricular systolic function is normal. The right ventricular  size is normal. There is normal pulmonary artery systolic pressure. The  estimated right ventricular systolic pressure is 36.6 mmHg.  3. The mitral valve is grossly normal. Mild to moderate mitral valve  regurgitation. No evidence of mitral stenosis.  4. The aortic valve is tricuspid. There is mild calcification of the  aortic valve. There is mild thickening of the aortic valve. Aortic valve  regurgitation is not visualized. Mild aortic valve sclerosis is present,  with no evidence of aortic valve  stenosis.  5. The inferior vena cava is normal in size with greater than 50%   respiratory variability, suggesting right atrial pressure of 3 mmHg.   Vascular ultrasound ABI: Summary:  Right: Resting right ankle-brachial index is within normal range. No  evidence of significant right lower extremity arterial disease. The right  toe-brachial index is abnormal.   Left: Resting left ankle-brachial index indicates moderate left lower  extremity arterial disease. The left toe-brachial index is abnormal.    Antimicrobials: Anti-infectives (From admission, onward)   Start     Dose/Rate Route Frequency Ordered Stop   07/13/20 1400  amoxicillin (AMOXIL) capsule 500 mg        500 mg Oral Every 12 hours 07/13/20 1121 07/14/20 2234   07/12/20 1800  penicillin G potassium 6 Million Units in dextrose 5 % 250 mL continuous infusion  Status:  Discontinued        6 Million Units 20.8 mL/hr over 12 Hours Intravenous Every 12 hours 07/12/20 1332 07/13/20 1121   07/11/20 1900  vancomycin (VANCOREADY) IVPB 1500 mg/300 mL  Status:  Discontinued        1,500 mg 150 mL/hr over 120 Minutes Intravenous Every 48 hours 07/09/20 1815 07/10/20 0301   07/10/20 1800  cefTRIAXone (ROCEPHIN) 2 g in sodium chloride 0.9 % 100 mL IVPB  Status:  Discontinued        2 g 200 mL/hr over 30 Minutes Intravenous Every 24 hours 07/10/20 0301 07/12/20 1332   07/10/20 0000  piperacillin-tazobactam (ZOSYN) IVPB 2.25 g  Status:  Discontinued        2.25 g 100 mL/hr over 30 Minutes Intravenous Every 6 hours 07/09/20 1815 07/10/20 0301   07/09/20 1830  vancomycin (VANCOREADY) IVPB  2000 mg/400 mL        2,000 mg 200 mL/hr over 120 Minutes Intravenous  Once 07/09/20 1752 07/09/20 2143   07/09/20 1800  piperacillin-tazobactam (ZOSYN) IVPB 3.375 g        3.375 g 100 mL/hr over 30 Minutes Intravenous  Once 07/09/20 1752 07/09/20 1926   07/09/20 1230  ceFAZolin (ANCEF) IVPB 2g/100 mL premix        2 g 200 mL/hr over 30 Minutes Intravenous  Once 07/09/20 1216 07/09/20 1315   07/09/20 1200  ceFAZolin (ANCEF)  IVPB 1 g/50 mL premix  Status:  Discontinued        1 g 100 mL/hr over 30 Minutes Intravenous  Once 07/09/20 1156 07/09/20 1216      Subjective: States breathing better, reports improvement with breathing treatments  Objective: Vitals:   07/18/20 1959 07/19/20 0446 07/19/20 0841 07/19/20 1300  BP: (!) 148/76 (!) 133/56 (!) 147/86 (!) 145/83  Pulse: 75 68 70 70  Resp: 18 18 14 17   Temp: (!) 97.5 F (36.4 C) 97.6 F (36.4 C) (!) 97.4 F (36.3 C) 97.6 F (36.4 C)  TempSrc: Oral Oral Oral Oral  SpO2: 96% 100% (!) 88% 96%  Weight:      Height:        Intake/Output Summary (Last 24 hours) at 07/19/2020 1526 Last data filed at 07/19/2020 1300 Gross per 24 hour  Intake 480 ml  Output --  Net 480 ml   Filed Weights   07/10/20 1531 07/17/20 0805  Weight: 104.8 kg 104.8 kg    Examination: General exam: Awake, laying in bed, in nad Respiratory system: Normal respiratory effort, no wheezing Cardiovascular system: regular rate, s1, s2 Gastrointestinal system: Soft, nondistended, positive BS Central nervous system: CN2-12 grossly intact, strength intact Extremities: Perfused, no clubbing Skin: Normal skin turgor, no notable skin lesions seen Psychiatry: Mood normal // no visual hallucinations   Data Reviewed: I have personally reviewed following labs and imaging studies  CBC: Recent Labs  Lab 07/13/20 0210 07/14/20 0832 07/15/20 1006 07/16/20 0505 07/17/20 0453  WBC 13.2* 15.5* 16.0* 12.3* 13.7*  NEUTROABS  --   --  11.4*  --   --   HGB 10.5* 10.7* 10.4* 10.4* 9.6*  HCT 32.6* 32.3* 31.7* 31.3* 28.5*  MCV 85.6 83.7 82.1 81.7 82.1  PLT 290 397 483* 471* 993*   Basic Metabolic Panel: Recent Labs  Lab 07/13/20 0210 07/13/20 0210 07/14/20 0832 07/15/20 1006 07/16/20 0505 07/17/20 0453 07/19/20 0229  NA 136   < > 138 138 137 134* 134*  K 3.9   < > 3.6 3.5 4.9 4.4 4.9  CL 101   < > 102 102 104 98 102  CO2 23   < > 24 22 17* 21* 20*  GLUCOSE 154*   < > 168* 157*  195* 317* 225*  BUN 44*   < > 43* 41* 47* 58* 80*  CREATININE 2.65*   < > 2.38* 2.36* 2.41* 2.58* 2.42*  CALCIUM 8.8*   < > 9.3 9.2 8.8* 8.7* 8.8*  MG 2.2  --  2.0 2.0  --   --   --    < > = values in this interval not displayed.   GFR: Estimated Creatinine Clearance: 22 mL/min (A) (by C-G formula based on SCr of 2.42 mg/dL (H)). Liver Function Tests: Recent Labs  Lab 07/16/20 0505 07/17/20 0453 07/19/20 0229  AST 27 21 28   ALT 22 21 29   ALKPHOS 147* 132* 128*  BILITOT 1.2 0.6 0.5  PROT 6.4* 6.2* 5.9*  ALBUMIN 2.0* 2.1* 2.2*   No results for input(s): LIPASE, AMYLASE in the last 168 hours. No results for input(s): AMMONIA in the last 168 hours. Coagulation Profile: No results for input(s): INR, PROTIME in the last 168 hours. Cardiac Enzymes: No results for input(s): CKTOTAL, CKMB, CKMBINDEX, TROPONINI in the last 168 hours. BNP (last 3 results) No results for input(s): PROBNP in the last 8760 hours. HbA1C: No results for input(s): HGBA1C in the last 72 hours. CBG: Recent Labs  Lab 07/18/20 1151 07/18/20 1641 07/18/20 2142 07/19/20 0703 07/19/20 1159  GLUCAP 229* 249* 241* 225* 240*   Lipid Profile: No results for input(s): CHOL, HDL, LDLCALC, TRIG, CHOLHDL, LDLDIRECT in the last 72 hours. Thyroid Function Tests: No results for input(s): TSH, T4TOTAL, FREET4, T3FREE, THYROIDAB in the last 72 hours. Anemia Panel: No results for input(s): VITAMINB12, FOLATE, FERRITIN, TIBC, IRON, RETICCTPCT in the last 72 hours. Sepsis Labs: Recent Labs  Lab 07/14/20 0832 07/15/20 1006 07/16/20 0505 07/17/20 0453  PROCALCITON 6.57 3.80 2.33 1.68    Recent Results (from the past 240 hour(s))  SARS Coronavirus 2 by RT PCR (hospital order, performed in [facility] hospital lab) Nasopharyngeal Nasopharyngeal Swab     Status: None   Collection Time: 07/14/20  5:33 AM   Specimen: Nasopharyngeal Swab  Result Value Ref Range Status   SARS Coronavirus 2 NEGATIVE NEGATIVE Final     Comment: (NOTE) SARS-CoV-2 target nucleic acids are NOT DETECTED.  The SARS-CoV-2 RNA is generally detectable in upper and lower respiratory specimens during the acute phase of infection. The lowest concentration of SARS-CoV-2 viral copies this assay can detect is 250 copies / mL. A negative result does not preclude SARS-CoV-2 infection and should not be used as the sole basis for treatment or other patient management decisions.  A negative result may occur with improper specimen collection / handling, submission of specimen other than nasopharyngeal swab, presence of viral mutation(s) within the areas targeted by this assay, and inadequate number of viral copies (<250 copies / mL). A negative result must be combined with clinical observations, patient history, and epidemiological information.  Fact Sheet for Patients:   StrictlyIdeas.no  Fact Sheet for Healthcare Providers: BankingDealers.co.za  This test is not yet approved or  cleared by the [place] FDA and has been authorized for detection and/or diagnosis of SARS-CoV-2 by FDA under an Emergency Use Authorization (EUA).  This EUA will remain in effect (meaning this test can be used) for the duration of the COVID-19 declaration under Section 564(b)(1) of the Act, 21 U.S.C. section 360bbb-3(b)(1), unless the authorization is terminated or revoked sooner.  Performed at [facility], [place] [address] [place], [place] [address]      Radiology Studies: No results found.  Scheduled Meds: . (feeding supplement) PROSource Plus  30 mL Oral BID BM  . allopurinol  100 mg Oral Daily  . amLODipine  10 mg Oral Daily  . aspirin EC  81 mg Oral Daily  . atorvastatin  80 mg Oral QPM  . calcitRIOL  0.5 mcg Oral Daily  . clopidogrel  75 mg Oral Daily  . enoxaparin (LOVENOX) injection  30 mg Subcutaneous Q24H  . fluticasone  1 spray Each Nare Daily  . gabapentin  100 mg Oral TID    . influenza vaccine adjuvanted  0.5 mL Intramuscular Tomorrow-1000  . insulin aspart  0-20 Units Subcutaneous TID WC  . insulin aspart  0-5  Units Subcutaneous QHS  . insulin aspart  8 Units Subcutaneous TID WC  . insulin glargine  30 Units Subcutaneous Daily  . levalbuterol  0.63 mg Nebulization Once  . methylPREDNISolone (SOLU-MEDROL) injection  40 mg Intravenous Q12H  . metoprolol tartrate  100 mg Oral BID  . mirabegron ER  25 mg Oral Daily  . multivitamin with minerals  1 tablet Oral Daily  . pantoprazole  40 mg Oral Daily  . sodium bicarbonate  650 mg Oral BID  . sodium chloride flush  3 mL Intravenous Q12H  . timolol  1 drop Both Eyes Daily   Continuous Infusions: . lactated ringers 100 mL/hr at 07/18/20 0419     LOS: 10 days   Marylu Lund, MD [facility] Pager On Amion  If 7PM-7AM, please contact night-coverage 07/19/2020, 3:26 PM

## 2020-07-19 NOTE — Plan of Care (Signed)
  Problem: Education: Goal: Knowledge of General Education information will improve Description: Including pain rating scale, medication(s)/side effects and non-pharmacologic comfort measures 07/19/2020 0524 by Claire Shown, RN Outcome: Progressing 07/19/2020 0445 by Claire Shown, RN Outcome: Progressing   Problem: Clinical Measurements: Goal: Will remain free from infection Outcome: Progressing   Problem: Activity: Goal: Risk for activity intolerance will decrease 07/19/2020 0524 by Claire Shown, RN Outcome: Progressing 07/19/2020 0445 by Claire Shown, RN Outcome: Progressing   Problem: Pain Managment: Goal: General experience of comfort will improve 07/19/2020 0524 by Claire Shown, RN Outcome: Progressing 07/19/2020 0445 by Claire Shown, RN Outcome: Progressing   Problem: Safety: Goal: Ability to remain free from injury will improve 07/19/2020 0524 by Claire Shown, RN Outcome: Progressing 07/19/2020 0445 by Claire Shown, RN Outcome: Progressing

## 2020-07-19 NOTE — Plan of Care (Signed)

## 2020-07-20 LAB — GLUCOSE, CAPILLARY
Glucose-Capillary: 152 mg/dL — ABNORMAL HIGH (ref 70–99)
Glucose-Capillary: 207 mg/dL — ABNORMAL HIGH (ref 70–99)
Glucose-Capillary: 234 mg/dL — ABNORMAL HIGH (ref 70–99)
Glucose-Capillary: 304 mg/dL — ABNORMAL HIGH (ref 70–99)

## 2020-07-20 LAB — BASIC METABOLIC PANEL
Anion gap: 13 (ref 5–15)
BUN: 95 mg/dL — ABNORMAL HIGH (ref 8–23)
CO2: 19 mmol/L — ABNORMAL LOW (ref 22–32)
Calcium: 9.2 mg/dL (ref 8.9–10.3)
Chloride: 104 mmol/L (ref 98–111)
Creatinine, Ser: 2.7 mg/dL — ABNORMAL HIGH (ref 0.44–1.00)
GFR calc Af Amer: 19 mL/min — ABNORMAL LOW (ref >60)
GFR calc non Af Amer: 16 mL/min — ABNORMAL LOW (ref >60)
Glucose, Bld: 268 mg/dL — ABNORMAL HIGH (ref 70–99)
Potassium: 4.7 mmol/L (ref 3.5–5.1)
Sodium: 136 mmol/L (ref 135–145)

## 2020-07-20 LAB — SARS CORONAVIRUS 2 BY RT PCR (HOSPITAL ORDER, PERFORMED IN ~~LOC~~ HOSPITAL LAB): SARS Coronavirus 2: NEGATIVE

## 2020-07-20 MED ORDER — METHYLPREDNISOLONE SODIUM SUCC 40 MG IJ SOLR
40.0000 mg | INTRAMUSCULAR | Status: DC
  Administered 2020-07-20 – 2020-07-21 (×2): 40 mg via INTRAVENOUS
  Filled 2020-07-20 (×2): qty 1

## 2020-07-20 NOTE — TOC Progression Note (Signed)
Transition of Care [facility]) - Progression Note    Patient Details  Name: MILIANI DEIKE MRN: [id number] Date of Birth: Jan 31, 1946  Transition of [facility]) CM/SW Contact  Curlene Labrum, RN Phone Number: 07/20/2020, 10:27 AM  Clinical Narrative: The patient will be ready for discharge to [facility] tomorrow, 07/21/2020.  I called [employer] and insurance authorization was started for [facility] - ref # [id number] and clinicals were faxed to [place] screen is being placed today.  Will discharge to [facility] tomorrow.   Expected Discharge Plan: Skilled Nursing Facility Barriers to Discharge: Continued Medical Work up  Expected Discharge Plan and Services Expected Discharge Plan: [place]   Discharge Planning Services: CM Consult   Living arrangements for the past 2 months: Single Family Home                     Social Determinants of Health (SDOH) Interventions    Readmission Risk Interventions Readmission Risk Prevention Plan 07/10/2020  Transportation Screening Complete  PCP or Specialist Appt within 3-5 Days Complete  HRI or [place] Complete  Social Work Consult for [place] Planning/Counseling Complete  Palliative Care Screening Complete  Medication Review [employer]) Complete  Some recent data might be hidden

## 2020-07-20 NOTE — Progress Notes (Signed)
Physical Therapy Treatment Patient Details Name: [name] MRN: [id number] DOB: 06/09/1946 Today's Date: 07/20/2020    History of Present Illness 74 year old female with past medical history notable for peripheral vascular disease, hypertension, hyperlipidemia, type 2 diabetes mellitus, CKD, CAD who presented to the ED with shortness of breath.  Found to have sepsis due to left lower extremity cellulitis.    PT Comments    Pt was seen for further work to increase her strength and control of static and dynamic balance skills.  Her walking distance was improved but quite fatigued her at the end of the session.  Pt is maintaining O2 sats 90% or greater, and using O2 to maintain the levels.  Her plan continues to be SNF care, as she was at home and much more independent than current status, and wishes to safely return there.     Follow Up Recommendations  SNF     Equipment Recommendations  Rolling walker with 5" wheels;3in1 (PT);Wheelchair (measurements PT);Wheelchair cushion (measurements PT)    Recommendations for Other Services       Precautions / Restrictions Precautions Precautions: Fall Precaution Comments: watch O2 Restrictions Weight Bearing Restrictions: No    Mobility  Bed Mobility               General bed mobility comments: in chair when PT arrived  Transfers Overall transfer level: Needs assistance Equipment used: Rolling walker (2 wheeled) Transfers: Sit to/from Stand Sit to Stand: Min guard         General transfer comment: min guard for safety and management of lines  Ambulation/Gait Ambulation/Gait assistance: Min guard Gait Distance (Feet): 60 Feet Assistive device: Rolling walker (2 wheeled) Gait Pattern/deviations: Step-through pattern;Decreased stride length;Wide base of support Gait velocity: decreased Gait velocity interpretation: <1.31 ft/sec, indicative of household ambulator General Gait Details: controlled speed with RW,  requires supervision to min guard to turn the walker   Stairs             Wheelchair Mobility    Modified Rankin (Stroke Patients Only)       Balance Overall balance assessment: Needs assistance Sitting-balance support: Feet supported Sitting balance-Leahy Scale: Good       Standing balance-Leahy Scale: Fair Standing balance comment: less than fair dynamically                            Cognition Arousal/Alertness: Awake/alert Behavior During Therapy: WFL for tasks assessed/performed Overall Cognitive Status: Within Functional Limits for tasks assessed                                        Exercises General Exercises - Lower Extremity Ankle Circles/Pumps: AROM;AAROM;5 reps Long Arc Quad: Strengthening;10 reps Heel Slides: Strengthening;10 reps Hip ABduction/ADduction: Strengthening;10 reps    General Comments General comments (skin integrity, edema, etc.): pt is expecting to go to rehab, and is noted to have O2 sats in controlled range.        Pertinent Vitals/Pain Pain Assessment: No/denies pain    Home Living                      Prior Function            PT Goals (current goals can now be found in the care plan section) Acute Rehab PT Goals Patient Stated Goal: get therapy to  get home Progress towards PT goals: Progressing toward goals    Frequency    Min 3X/week      PT Plan Current plan remains appropriate    Co-evaluation              AM-PAC PT "6 Clicks" Mobility   Outcome Measure  Help needed turning from your back to your side while in a flat bed without using bedrails?: None Help needed moving from lying on your back to sitting on the side of a flat bed without using bedrails?: None Help needed moving to and from a bed to a chair (including a wheelchair)?: A Little Help needed standing up from a chair using your arms (e.g., wheelchair or bedside chair)?: A Little Help needed to walk in  hospital room?: A Little Help needed climbing 3-5 steps with a railing? : A Lot 6 Click Score: 19    End of Session Equipment Utilized During Treatment: Gait belt;Oxygen Activity Tolerance: Patient limited by fatigue;Treatment limited secondary to medical complications (Comment) Patient left: in chair;with call bell/phone within reach;with chair alarm set Nurse Communication: Mobility status PT Visit Diagnosis: Other abnormalities of gait and mobility (R26.89);Muscle weakness (generalized) (M62.81);Pain Pain - Right/Left: Left Pain - part of body: Leg     Time: 0762-2633 PT Time Calculation (min) (ACUTE ONLY): 21 min  Charges:  $Gait Training: 8-22 mins                    Ramond Dial 07/20/2020, 4:01 PM  Mee Hives, PT MS Acute Rehab Dept. Number: [place] and [place]

## 2020-07-20 NOTE — Progress Notes (Signed)
Inpatient Diabetes Program Recommendations  AACE/ADA: New Consensus Statement on Inpatient Glycemic Control (2015)  Target Ranges:  Prepandial:   less than 140 mg/dL      Peak postprandial:   less than 180 mg/dL (1-2 hours)      Critically ill patients:  140 - 180 mg/dL   Lab Results  Component Value Date   GLUCAP 207 (H) 07/20/2020   HGBA1C 7.8 (H) 06/26/2020    Review of Glycemic Control Results for [name], [name] (MRN [id number]) as of 07/20/2020 13:05  Ref. Range 07/18/2020 06:54 07/18/2020 11:51 07/18/2020 16:41 07/18/2020 21:42 07/19/2020 07:03 07/19/2020 11:59 07/19/2020 16:20 07/19/2020 21:18 07/20/2020 06:49 07/20/2020 12:17  Glucose-Capillary Latest Ref Range: 70 - 99 mg/dL 280 (H) 229 (H) 249 (H) 241 (H) 225 (H) 240 (H) 256 (H) 152 (H) 152 (H) 207 (H)    Inpatient Diabetes Program Recommendations:     Please consider,  Novolog 10 units tid with meals if eats at least 50 % of meal  Will continue to follow while inpatient.  Thank you, Reche Dixon, RN, BSN Diabetes Coordinator Inpatient Diabetes Program [phone] (team pager from 8a-5p)

## 2020-07-20 NOTE — Progress Notes (Signed)
PROGRESS NOTE    [name]  UXL:[id number] DOB: 1946/06/07 DOA: 07/09/2020 PCP: [name], MD    Brief Narrative:  74 year old female with past medical history notable for peripheral vascular disease, hypertension, hyperlipidemia, type 2 diabetes mellitus, CKD, CAD who presented to the ED with shortness of breath.  Patient has been feeling ill, with weakness, myalgias over the past few days.  She was seen by her PCP, Dr. [name] on 06/26/2020 and provided antibiotics.   In the ED, physical exam notable for left lower extremity cellulitis with multiple skin breaks and ulcerations.  WBC elevated 16.4. TRH was consulted for admission for failed outpatient antibiotic therapy for cellulitis.  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  Recently started on outpatient antibiotics for lower extremity cellulitis without improvement.  On arrival to the ED, patient was noted to have elevated WBC count of 16.4, lactic acid of 2.3, CRP 27.7 with a procalcitonin of 59.95.  Blood cultures x2+ for gram-positive cocci in chains with BCID notable for Streptococcus species.  TTE with LVEF 50-60%, normal LV function, normal RV systolic function, no evidence of valvular vegetations on TTE. --Pt completed 7 day course of antibiotics per ID, now signed off --Supportive care, antipyretics as needed --Procalcitonin had been steadily trending down and pt  remains stable  Acute hypoxic respiratory failure Likely history of COPD, undiagnosed --TTE with normal LVEF.  Chest x-ray with mild vascular congestion and basilar atelectasis. D-dimer elevated 2.59.  Vascular duplex ultrasound bilateral lower extremities negative for DVT. --Unable to perform CT angiogram chest 2/2 CKD, check NM VQ scan. --VQ unremarkable for acute PE --Wheezing on exam with decreased BS in the setting of past tobacco abuse x 38yrs. Pt did quit smoking in 2009 (50 pack year smoking hx) --Continue to titrate supple oxygen to maintain SPO2 greater than 88%, --Incentive spirometry q1h while awake --Continue to wean O2 as tolerated, now down to [facility]. Pt is O2 naive prior to admit --Improving with neb tx and steroids. Weaning steroids. 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  Case was discussed with vascular surgery, Dr. Lurline Hare by admitting physician who recommended venous reflux study and placement of Unna boots by wound care if positive, unfortunately per ultrasound technician, only perform that exam outpatient.  Vascular ultrasound ABI demonstrating moderate arterial disease left lower extremity. --Likely will need close follow-up with vascular surgery after resolution of septicemia outpatient --Vascular Navigator is following  CKD stage V (baseline Cr 2.6-2.8) --Continued with renal diet --Calcitriol --sodium bicarb 650mg  BID --Avoid nephrotoxins, renally dose all medications --Repeat bmet in AM  Type 2 diabetes mellitus Hemoglobin A1c 7.8 on 06/26/2020.  -See above, as high dosed steroids are ordered, thus increased lantus to 30 units daily with addition of 8 units meal coverage -Have increased  SSI to resistant scale -Glycemic trends overall stable currently. Anticipate better control once pt is  weaned off steroids  Hyperlipidemia: Continue atorvastatin 80 mg p.o. daily as pt tolerates  PVD/CAD --Continue aspirin 81 mg p.o. daily, Plavix 75 mg p.o. daily as pt tolerates  Essential hypertension --Continue with amlodipine 10mg  p.o. daily, metoprolol 100mg  BID -- Cont current regimen for now, anticipate bp to be better controlled once pt is weaned off steroids  Chronic pain syndrome --Continue Zanaflex, Ultram  Morbid obesity BMI 43.8.   -Recommend diet/lifestyle modification -Outpatient follow-up with PCP, considerations of bariatric medicine outpatient referral.  DVT prophylaxis: Lovenox subq Code Status: Full Family Communication: Pt in room, family not at bedside but did update family over phone recently  Status is: Inpatient  Remains inpatient appropriate because:Hemodynamically unstable, Unsafe d/c plan, IV treatments appropriate due to intensity of illness or inability to take PO and Inpatient level of care appropriate due to severity of illness   Dispo:  Patient From: Home  Planned Disposition: [place]  Expected discharge date: 07/20/20  Medically stable for discharge: No    Consultants:   ID  Vascular Surgery  Procedures:  TTE IMPRESSIONS  1. Left ventricular ejection fraction, by estimation, is 55 to 60%. The  left ventricle has normal function. The left ventricle has no regional  wall motion abnormalities. Left ventricular diastolic function could not  be evaluated.  2. Right ventricular systolic function is normal. The right ventricular  size is normal. There is normal pulmonary artery systolic pressure. The  estimated right ventricular systolic pressure is 56.4 mmHg.  3. The mitral valve is grossly normal. Mild to moderate mitral valve  regurgitation. No evidence of mitral stenosis.  4. The aortic valve is tricuspid. There is mild calcification of the  aortic valve. There is mild thickening of the aortic valve. Aortic  valve  regurgitation is not visualized. Mild aortic valve sclerosis is present,  with no evidence of aortic valve  stenosis.  5. The inferior vena cava is normal in size with greater than 50%  respiratory variability, suggesting right atrial pressure of 3 mmHg.   Vascular ultrasound ABI: Summary:  Right: Resting right ankle-brachial index is within normal range. No  evidence of significant right lower extremity arterial disease. The right  toe-brachial index is abnormal.   Left: Resting left ankle-brachial index indicates moderate left lower  extremity arterial disease. The left toe-brachial index is abnormal.    Antimicrobials: Anti-infectives (From admission, onward)   Start     Dose/Rate Route Frequency Ordered Stop   07/13/20 1400  amoxicillin (AMOXIL) capsule 500 mg        500 mg Oral Every 12 hours 07/13/20 1121 07/14/20 2234   07/12/20 1800  penicillin G potassium 6 Million Units in dextrose 5 % 250 mL continuous infusion  Status:  Discontinued        6 Million Units 20.8 mL/hr over 12 Hours Intravenous Every 12 hours 07/12/20 1332 07/13/20 1121   07/11/20 1900  vancomycin (VANCOREADY) IVPB 1500 mg/300 mL  Status:  Discontinued        1,500 mg 150 mL/hr over 120 Minutes Intravenous Every 48 hours 07/09/20 1815 07/10/20 0301   07/10/20 1800  cefTRIAXone (ROCEPHIN) 2 g in sodium chloride 0.9 % 100 mL IVPB  Status:  Discontinued        2 g 200 mL/hr over 30 Minutes Intravenous Every 24 hours 07/10/20 0301 07/12/20 1332   07/10/20 0000  piperacillin-tazobactam (ZOSYN) IVPB 2.25  g  Status:  Discontinued        2.25 g 100 mL/hr over 30 Minutes Intravenous Every 6 hours 07/09/20 1815 07/10/20 0301   07/09/20 1830  vancomycin (VANCOREADY) IVPB 2000 mg/400 mL        2,000 mg 200 mL/hr over 120 Minutes Intravenous  Once 07/09/20 1752 07/09/20 2143   07/09/20 1800  piperacillin-tazobactam (ZOSYN) IVPB 3.375 g        3.375 g 100 mL/hr over 30 Minutes Intravenous  Once 07/09/20  1752 07/09/20 1926   07/09/20 1230  ceFAZolin (ANCEF) IVPB 2g/100 mL premix        2 g 200 mL/hr over 30 Minutes Intravenous  Once 07/09/20 1216 07/09/20 1315   07/09/20 1200  ceFAZolin (ANCEF) IVPB 1 g/50 mL premix  Status:  Discontinued        1 g 100 mL/hr over 30 Minutes Intravenous  Once 07/09/20 1156 07/09/20 1216      Subjective: Reports feeling better this AM. Is asking for another breathing treatment  Objective: Vitals:   07/19/20 2009 07/20/20 0437 07/20/20 0744 07/20/20 0747  BP: (!) 147/84 (!) 148/104  (!) 151/105  Pulse: 74 66 62 72  Resp: 17 16  18   Temp: 97.9 F (36.6 C) (!) 97.5 F (36.4 C)  (!) 97.5 F (36.4 C)  TempSrc: Oral Oral  Oral  SpO2: 95% 100% 100% 100%  Weight:      Height:        Intake/Output Summary (Last 24 hours) at 07/20/2020 1524 Last data filed at 07/20/2020 0902 Gross per 24 hour  Intake 483 ml  Output --  Net 483 ml   Filed Weights   07/10/20 1531 07/17/20 0805  Weight: 104.8 kg 104.8 kg    Examination: General exam: Conversant, in no acute distress Respiratory system: normal chest rise, clear, no audible wheezing Cardiovascular system: regular rhythm, s1-s2 Gastrointestinal system: Nondistended, nontender, pos BS Central nervous system: No seizures, no tremors Extremities: No cyanosis, no joint deformities Skin: No rashes, no pallor Psychiatry: Affect normal // no auditory hallucinations   Data Reviewed: I have personally reviewed following labs and imaging studies  CBC: Recent Labs  Lab 07/14/20 0832 07/15/20 1006 07/16/20 0505 07/17/20 0453  WBC 15.5* 16.0* 12.3* 13.7*  NEUTROABS  --  11.4*  --   --   HGB 10.7* 10.4* 10.4* 9.6*  HCT 32.3* 31.7* 31.3* 28.5*  MCV 83.7 82.1 81.7 82.1  PLT 397 483* 471* 458*   Basic Metabolic Panel: Recent Labs  Lab 07/14/20 0832 07/15/20 1006 07/16/20 0505 07/17/20 0453 07/19/20 0229  NA 138 138 137 134* 134*  K 3.6 3.5 4.9 4.4 4.9  CL 102 102 104 98 102  CO2 24 22 17*  21* 20*  GLUCOSE 168* 157* 195* 317* 225*  BUN 43* 41* 47* 58* 80*  CREATININE 2.38* 2.36* 2.41* 2.58* 2.42*  CALCIUM 9.3 9.2 8.8* 8.7* 8.8*  MG 2.0 2.0  --   --   --    GFR: Estimated Creatinine Clearance: 22 mL/min (A) (by C-G formula based on SCr of 2.42 mg/dL (H)). Liver Function Tests: Recent Labs  Lab 07/16/20 0505 07/17/20 0453 07/19/20 0229  AST 27 21 28   ALT 22 21 29   ALKPHOS 147* 132* 128*  BILITOT 1.2 0.6 0.5  PROT 6.4* 6.2* 5.9*  ALBUMIN 2.0* 2.1* 2.2*   No results for input(s): LIPASE, AMYLASE in the last 168 hours. No results for input(s): AMMONIA in the last 168 hours. Coagulation  Profile: No results for input(s): INR, PROTIME in the last 168 hours. Cardiac Enzymes: No results for input(s): CKTOTAL, CKMB, CKMBINDEX, TROPONINI in the last 168 hours. BNP (last 3 results) No results for input(s): PROBNP in the last 8760 hours. HbA1C: No results for input(s): HGBA1C in the last 72 hours. CBG: Recent Labs  Lab 07/19/20 1159 07/19/20 1620 07/19/20 2118 07/20/20 0649 07/20/20 1217  GLUCAP 240* 256* 152* 152* 207*   Lipid Profile: No results for input(s): CHOL, HDL, LDLCALC, TRIG, CHOLHDL, LDLDIRECT in the last 72 hours. Thyroid Function Tests: No results for input(s): TSH, T4TOTAL, FREET4, T3FREE, THYROIDAB in the last 72 hours. Anemia Panel: No results for input(s): VITAMINB12, FOLATE, FERRITIN, TIBC, IRON, RETICCTPCT in the last 72 hours. Sepsis Labs: Recent Labs  Lab 07/14/20 0832 07/15/20 1006 07/16/20 0505 07/17/20 0453  PROCALCITON 6.57 3.80 2.33 1.68    Recent Results (from the past 240 hour(s))  SARS Coronavirus 2 by RT PCR (hospital order, performed in [facility] hospital lab) Nasopharyngeal Nasopharyngeal Swab     Status: None   Collection Time: 07/14/20  5:33 AM   Specimen: Nasopharyngeal Swab  Result Value Ref Range Status   SARS Coronavirus 2 NEGATIVE NEGATIVE Final    Comment: (NOTE) SARS-CoV-2 target nucleic acids are NOT  DETECTED.  The SARS-CoV-2 RNA is generally detectable in upper and lower respiratory specimens during the acute phase of infection. The lowest concentration of SARS-CoV-2 viral copies this assay can detect is 250 copies / mL. A negative result does not preclude SARS-CoV-2 infection and should not be used as the sole basis for treatment or other patient management decisions.  A negative result may occur with improper specimen collection / handling, submission of specimen other than nasopharyngeal swab, presence of viral mutation(s) within the areas targeted by this assay, and inadequate number of viral copies (<250 copies / mL). A negative result must be combined with clinical observations, patient history, and epidemiological information.  Fact Sheet for Patients:   StrictlyIdeas.no  Fact Sheet for Healthcare Providers: BankingDealers.co.za  This test is not yet approved or  cleared by the [place] FDA and has been authorized for detection and/or diagnosis of SARS-CoV-2 by FDA under an Emergency Use Authorization (EUA).  This EUA will remain in effect (meaning this test can be used) for the duration of the COVID-19 declaration under Section 564(b)(1) of the Act, 21 U.S.C. section 360bbb-3(b)(1), unless the authorization is terminated or revoked sooner.  Performed at [facility], [place] [address] [place], [place] [address]   SARS Coronavirus 2 by RT PCR (hospital order, performed in [employer] [facility] hospital lab)     Status: None   Collection Time: 07/20/20 11:47 AM  Result Value Ref Range Status   SARS Coronavirus 2 NEGATIVE NEGATIVE Final    Comment: (NOTE) SARS-CoV-2 target nucleic acids are NOT DETECTED.  The SARS-CoV-2 RNA is generally detectable in upper and lower respiratory specimens during the acute phase of infection. The lowest concentration of SARS-CoV-2 viral copies this assay can detect is 250 copies / mL. A  negative result does not preclude SARS-CoV-2 infection and should not be used as the sole basis for treatment or other patient management decisions.  A negative result may occur with improper specimen collection / handling, submission of specimen other than nasopharyngeal swab, presence of viral mutation(s) within the areas targeted by this assay, and inadequate number of viral copies (<250 copies / mL). A negative result must be combined with clinical observations, patient history,  and epidemiological information.  Fact Sheet for Patients:   StrictlyIdeas.no  Fact Sheet for Healthcare Providers: BankingDealers.co.za  This test is not yet approved or  cleared by the [place] FDA and has been authorized for detection and/or diagnosis of SARS-CoV-2 by FDA under an Emergency Use Authorization (EUA).  This EUA will remain in effect (meaning this test can be used) for the duration of the COVID-19 declaration under Section 564(b)(1) of the Act, 21 U.S.C. section 360bbb-3(b)(1), unless the authorization is terminated or revoked sooner.  Performed at [facility], [place] [address] [place], [place] [address]      Radiology Studies: No results found.  Scheduled Meds: . (feeding supplement) PROSource Plus  30 mL Oral BID BM  . allopurinol  100 mg Oral Daily  . amLODipine  10 mg Oral Daily  . aspirin EC  81 mg Oral Daily  . atorvastatin  80 mg Oral QPM  . calcitRIOL  0.5 mcg Oral Daily  . clopidogrel  75 mg Oral Daily  . enoxaparin (LOVENOX) injection  30 mg Subcutaneous Q24H  . fluticasone  1 spray Each Nare Daily  . gabapentin  100 mg Oral TID  . influenza vaccine adjuvanted  0.5 mL Intramuscular Tomorrow-1000  . insulin aspart  0-20 Units Subcutaneous TID WC  . insulin aspart  0-5 Units Subcutaneous QHS  . insulin aspart  8 Units Subcutaneous TID WC  . insulin glargine  30 Units Subcutaneous Daily  . levalbuterol  0.63 mg  Nebulization Once  . methylPREDNISolone (SOLU-MEDROL) injection  40 mg Intravenous Q24H  . metoprolol tartrate  100 mg Oral BID  . mirabegron ER  25 mg Oral Daily  . multivitamin with minerals  1 tablet Oral Daily  . pantoprazole  40 mg Oral Daily  . sodium bicarbonate  650 mg Oral BID  . sodium chloride flush  3 mL Intravenous Q12H  . timolol  1 drop Both Eyes Daily   Continuous Infusions: . lactated ringers 100 mL/hr at 07/18/20 0419     LOS: 11 days   Marylu Lund, MD [facility] Pager On Amion  If 7PM-7AM, please contact night-coverage 07/20/2020, 3:24 PM

## 2020-07-21 DIAGNOSIS — A419 Sepsis, unspecified organism: Secondary | ICD-10-CM | POA: Diagnosis not present

## 2020-07-21 DIAGNOSIS — E118 Type 2 diabetes mellitus with unspecified complications: Secondary | ICD-10-CM | POA: Diagnosis not present

## 2020-07-21 DIAGNOSIS — Z743 Need for continuous supervision: Secondary | ICD-10-CM | POA: Diagnosis not present

## 2020-07-21 DIAGNOSIS — J449 Chronic obstructive pulmonary disease, unspecified: Secondary | ICD-10-CM | POA: Diagnosis not present

## 2020-07-21 DIAGNOSIS — I739 Peripheral vascular disease, unspecified: Secondary | ICD-10-CM | POA: Diagnosis not present

## 2020-07-21 DIAGNOSIS — R0989 Other specified symptoms and signs involving the circulatory and respiratory systems: Secondary | ICD-10-CM | POA: Diagnosis not present

## 2020-07-21 DIAGNOSIS — I10 Essential (primary) hypertension: Secondary | ICD-10-CM | POA: Diagnosis not present

## 2020-07-21 DIAGNOSIS — M2550 Pain in unspecified joint: Secondary | ICD-10-CM | POA: Diagnosis not present

## 2020-07-21 DIAGNOSIS — Z7401 Bed confinement status: Secondary | ICD-10-CM | POA: Diagnosis not present

## 2020-07-21 DIAGNOSIS — E1165 Type 2 diabetes mellitus with hyperglycemia: Secondary | ICD-10-CM | POA: Diagnosis not present

## 2020-07-21 DIAGNOSIS — J9601 Acute respiratory failure with hypoxia: Secondary | ICD-10-CM | POA: Diagnosis not present

## 2020-07-21 DIAGNOSIS — L03116 Cellulitis of left lower limb: Secondary | ICD-10-CM | POA: Diagnosis not present

## 2020-07-21 DIAGNOSIS — G8929 Other chronic pain: Secondary | ICD-10-CM

## 2020-07-21 DIAGNOSIS — E11622 Type 2 diabetes mellitus with other skin ulcer: Secondary | ICD-10-CM | POA: Diagnosis not present

## 2020-07-21 DIAGNOSIS — L0390 Cellulitis, unspecified: Secondary | ICD-10-CM | POA: Diagnosis not present

## 2020-07-21 DIAGNOSIS — R5381 Other malaise: Secondary | ICD-10-CM | POA: Diagnosis not present

## 2020-07-21 DIAGNOSIS — R062 Wheezing: Secondary | ICD-10-CM | POA: Diagnosis not present

## 2020-07-21 DIAGNOSIS — I2510 Atherosclerotic heart disease of native coronary artery without angina pectoris: Secondary | ICD-10-CM | POA: Diagnosis not present

## 2020-07-21 DIAGNOSIS — L97909 Non-pressure chronic ulcer of unspecified part of unspecified lower leg with unspecified severity: Secondary | ICD-10-CM | POA: Diagnosis not present

## 2020-07-21 DIAGNOSIS — N185 Chronic kidney disease, stage 5: Secondary | ICD-10-CM | POA: Diagnosis not present

## 2020-07-21 DIAGNOSIS — I83028 Varicose veins of left lower extremity with ulcer other part of lower leg: Secondary | ICD-10-CM | POA: Diagnosis not present

## 2020-07-21 DIAGNOSIS — N189 Chronic kidney disease, unspecified: Secondary | ICD-10-CM | POA: Diagnosis not present

## 2020-07-21 DIAGNOSIS — E119 Type 2 diabetes mellitus without complications: Secondary | ICD-10-CM | POA: Diagnosis not present

## 2020-07-21 DIAGNOSIS — Z66 Do not resuscitate: Secondary | ICD-10-CM | POA: Diagnosis not present

## 2020-07-21 DIAGNOSIS — E1122 Type 2 diabetes mellitus with diabetic chronic kidney disease: Secondary | ICD-10-CM | POA: Diagnosis not present

## 2020-07-21 DIAGNOSIS — R0602 Shortness of breath: Secondary | ICD-10-CM | POA: Diagnosis not present

## 2020-07-21 DIAGNOSIS — E785 Hyperlipidemia, unspecified: Secondary | ICD-10-CM | POA: Diagnosis not present

## 2020-07-21 DIAGNOSIS — Z20822 Contact with and (suspected) exposure to covid-19: Secondary | ICD-10-CM | POA: Diagnosis not present

## 2020-07-21 DIAGNOSIS — M6281 Muscle weakness (generalized): Secondary | ICD-10-CM | POA: Diagnosis not present

## 2020-07-21 LAB — GLUCOSE, CAPILLARY
Glucose-Capillary: 225 mg/dL — ABNORMAL HIGH (ref 70–99)
Glucose-Capillary: 136 mg/dL — ABNORMAL HIGH (ref 70–99)

## 2020-07-21 MED ORDER — INSULIN GLARGINE 100 UNIT/ML ~~LOC~~ SOLN: 30.0000 [IU] | Freq: Every day | SUBCUTANEOUS | 0 refills | Status: AC

## 2020-07-21 MED ORDER — IPRATROPIUM-ALBUTEROL 0.5-2.5 (3) MG/3ML IN SOLN: 3.0000 mL | RESPIRATORY_TRACT | 0 refills | Status: AC | PRN

## 2020-07-21 MED ORDER — PREDNISONE 10 MG PO TABS: ORAL_TABLET | ORAL | 0 refills | Status: AC

## 2020-07-21 MED ORDER — FLUTICASONE PROPIONATE 50 MCG/ACT NA SUSP: 1.0000 | Freq: Every day | NASAL | 0 refills | Status: AC

## 2020-07-21 MED ORDER — TRAMADOL HCL 50 MG PO TABS
50.0000 mg | ORAL_TABLET | Freq: Four times a day (QID) | ORAL | 0 refills | Status: DC | PRN
Start: 2020-07-21 — End: 2020-09-10

## 2020-07-21 MED ORDER — INSULIN ASPART 100 UNIT/ML ~~LOC~~ SOLN: 8.0000 [IU] | Freq: Three times a day (TID) | SUBCUTANEOUS | 0 refills | Status: DC

## 2020-07-21 NOTE — Progress Notes (Signed)
Occupational Therapy Treatment Patient Details Name: [name] MRN: [id number] DOB: [birth year] Today's Date: 07/21/2020    History of present illness 74 year old female with past medical history notable for peripheral vascular disease, hypertension, hyperlipidemia, type 2 diabetes mellitus, CKD, CAD who presented to the ED with shortness of breath.  Found to have sepsis due to left lower extremity cellulitis.   OT comments  Pt progressing well with OT goals with improving cardiopulmonary tolerance and strength noted though with continued deficits noted. Pt received on 2 L O2 and sustained >95% throughout session. Guided pt in ADLs seated at sink with Min A for UB ADLs and Mod A for LB ADLs. Reinforced energy conservation strategies during ADLs to maximize independence and safety with pt verbalizing understanding.    Follow Up Recommendations  SNF;Supervision/Assistance - 24 hour    Equipment Recommendations  3 in 1 bedside commode    Recommendations for Other Services      Precautions / Restrictions Precautions Precautions: Fall Precaution Comments: watch O2 Restrictions Weight Bearing Restrictions: No       Mobility Bed Mobility               General bed mobility comments: up in recliner on arrival  Transfers Overall transfer level: Needs assistance Equipment used: Rolling walker (2 wheeled) Transfers: Sit to/from Stand;Stand Pivot Transfers Sit to Stand: Min guard Stand pivot transfers: Min guard       General transfer comment: min guard for safety,  but no LOB noted today    Balance Overall balance assessment: Needs assistance Sitting-balance support: Feet supported Sitting balance-Leahy Scale: Good     Standing balance support: Single extremity supported;During functional activity Standing balance-Leahy Scale: Fair Standing balance comment: fair standing at sink with one handed support during ADls                           ADL either  performed or assessed with clinical judgement   ADL Overall ADL's : Needs assistance/impaired     Grooming: Set up;Sitting;Wash/dry face   Upper Body Bathing: Minimal assistance;Sitting Upper Body Bathing Details (indicate cue type and reason): Min A to bathe back seated at sink Lower Body Bathing: Moderate assistance;Sit to/from stand Lower Body Bathing Details (indicate cue type and reason): Overall Mod A for LB bathing seated and standing at sink. pt noted to be fatiguing during LB bathing, assistance needed for posterior region. would require assist for below the knees as well but bandaged at this time Upper Body Dressing : Set up;Sitting Upper Body Dressing Details (indicate cue type and reason): Setup to don clean hospital gown                 Functional mobility during ADLs: Min guard;Rolling walker General ADL Comments: Pt with improving endurance and strength though with continued deficits compared to baseline. Focused on reinforcing energy conservation strategies during ADLs     Vision   Vision Assessment?: No apparent visual deficits   Perception     Praxis      Cognition Arousal/Alertness: Awake/alert Behavior During Therapy: WFL for tasks assessed/performed Overall Cognitive Status: Within Functional Limits for tasks assessed                                          Exercises     Shoulder Instructions  General Comments Pt received on 2 L O2 and sustained >95% throughout session though noted with SOB after ADLs, 2/4 DOE. Pt noted with improved pursed lip breathing without cues needed, reports nasal spray has increased ability to breathe in through nose    Pertinent Vitals/ Pain       Pain Assessment: No/denies pain  Home Living                                          Prior Functioning/Environment              Frequency  Min 2X/week        Progress Toward Goals  OT Goals(current goals can now be  found in the care plan section)  Progress towards OT goals: Progressing toward goals  Acute Rehab OT Goals Patient Stated Goal: get therapy to get home OT Goal Formulation: With patient Time For Goal Achievement: 07/26/20 Potential to Achieve Goals: Good ADL Goals Pt Will Perform Grooming: with min guard assist;standing Pt Will Perform Lower Body Bathing: with min assist;sit to/from stand;sitting/lateral leans;with adaptive equipment Pt Will Perform Lower Body Dressing: with min assist;sitting/lateral leans;sit to/from stand;with adaptive equipment Pt Will Transfer to Toilet: with min assist;ambulating;regular height toilet Pt Will Perform Tub/Shower Transfer: with min assist;ambulating;shower seat;rolling walker Additional ADL Goal #1: Pt will recall and apply 3-5 ECS strategies to ADL routine Additional ADL Goal #2: Pt will self monitor and maintain SpO2 >88% with use of pursed lip breathing and rest breaks  Plan Discharge plan remains appropriate    Co-evaluation                 AM-PAC OT "6 Clicks" Daily Activity     Outcome Measure   Help from another person eating meals?: A Little Help from another person taking care of personal grooming?: A Little Help from another person toileting, which includes using toliet, bedpan, or urinal?: A Lot Help from another person bathing (including washing, rinsing, drying)?: A Lot Help from another person to put on and taking off regular upper body clothing?: A Little Help from another person to put on and taking off regular lower body clothing?: A Lot 6 Click Score: 15    End of Session Equipment Utilized During Treatment: Rolling walker;Oxygen  OT Visit Diagnosis: Unsteadiness on feet (R26.81);Other abnormalities of gait and mobility (R26.89);Muscle weakness (generalized) (M62.81)   Activity Tolerance Patient tolerated treatment well   Patient Left in chair;with call bell/phone within reach   Nurse Communication Mobility  status;Other (comment) (SPo2)        Time: 6389-3734 OT Time Calculation (min): 23 min  Charges: OT General Charges $OT Visit: 1 Visit OT Treatments $Self Care/Home Management : 8-22 mins $Therapeutic Activity: 8-22 mins  Layla Maw, OTR/L   Layla Maw 07/21/2020, 12:11 PM

## 2020-07-21 NOTE — Care Management Important Message (Signed)
Important Message  Patient Details  Name: NOVIE MAGGIO MRN: [id number] Date of Birth: 1945-11-08   Medicare Important Message Given:  Yes - Important Message mailed due to current National Emergency  Verbal consent obtained due to current National Emergency  Relationship to patient: Self Contact Name: Kayse Puccini Call Date: 07/21/20  Time: 1319 Phone: [phone] Outcome: Spoke with contact Important Message mailed to: Patient address on file    [name] 07/21/2020, 1:19 PM

## 2020-07-21 NOTE — Discharge Summary (Signed)
Physician Discharge Summary  [name]:[id number] DOB: October 14, 1946 DOA: 07/09/2020  PCP: [name], MD  Admit date: 07/09/2020 Discharge date: 07/21/2020  Admitted From: Home Disposition:  SNF  Recommendations for Outpatient Follow-up:  1. Follow up with PCP in 1-2 weeks 2. Please continue bronchodilator as needed for sob or wheezing 3. Recommend referral to Pulmonary as outpatient for outpatient PFT's 4. Please monitor glucose closely. Anticipate patient's glucose to trend down as her steroids are being weaned, thus she will likely require less insulin over time. Please adjust insulin dose accordingly 5. Please wean O2 as tolerated  NCCSR was reviewed. Limited quantity of pt's home narcotic was prescribed as SNF requires prescriptions at time of d/c and transfer to SNF  COVID test is neg  Discharge Condition:Stable CODE STATUS:Full Diet recommendation: Diabetic   Brief/Interim Summary: 74 year old female with past medical history notable for peripheral vascular disease, hypertension, hyperlipidemia, type 2 diabetes mellitus, CKD, CAD who presented to the ED with shortness of breath. Patient has been feeling ill, with weakness, myalgias over the past few days. She was seen by her PCP, Dr. Jenny [name] on 06/26/2020 and provided antibiotics.   In the ED, physical exam notable for left lower extremity cellulitis with multiple skin breaks and ulcerations. WBC elevated 16.4. TRH was consulted for admission for failed outpatient antibiotic therapy for cellulitis.  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 Recently started on outpatient antibiotics for lower extremity cellulitis without improvement. On arrival to the ED, patient was noted to have elevated WBC count of 16.4, lactic acid of 2.3, CRP 27.7 with a procalcitonin of 59.95. Blood cultures x2+ for gram-positive cocci in chains with BCID notable for Streptococcus species. TTE with LVEF 50-60%, normal LV function, normal RV systolic function, no evidence of valvular vegetations on TTE. --Pt completed 7 day course of antibiotics per ID, now signed off --Supportive care, antipyretics as needed --Procalcitonin had been steadily trending down and pt remains stable  Acute hypoxic respiratory failure Likely history of COPD, undiagnosed --TTE with normal LVEF. Chest x-ray with mild vascular congestion and basilar atelectasis.D-dimer elevated 2.59.Vascular duplex ultrasound bilateral lower extremities negative for DVT. --Unable to perform CT angiogram chest2/2 CKD, checkNMVQ scan. --VQ unremarkable for acute PE --Wheezing on exam with decreased BS in the setting of past tobacco abuse x 35yr. Pt did quit smoking in 2009 (50 pack year smoking hx) --Continue to titrate supple oxygen to maintain SPO2 greater than88%, --Incentive spirometryq1hwhile awake --Continue to wean O2 as tolerated, now down to [facility] Pt is O2 naive prior to admit --Improving with neb tx and steroids. Weaning steroids. 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 Case was discussed with vascular surgery, Dr. KLurline Hareby admitting physician who recommended  venous reflux study  and placement of Unna boots by wound care if positive, unfortunately per ultrasound technician, only perform that exam outpatient. Vascular ultrasound ABI demonstrating moderate arterial disease left lower extremity. --Likely will need close follow-up with vascular surgery after resolution of septicemia outpatient --Vascular Navigator is following  CKD stage V (baseline Cr 2.6-2.8) --Continued with renal diet --Calcitriol --sodium bicarb 675m BID --Avoid nephrotoxins, renally dose all medications  Type 2 diabetes mellitus Hemoglobin A1c 7.8 on 06/26/2020.  -See above, as high dosed steroids are ordered, thus increased lantus to 30 units daily with addition of 8 units meal coverage -Glycemic trends overall stable currently. Anticipate better control once pt is weaned off steroids  Hyperlipidemia:Continue atorvastatin 80 mg p.o. daily as pt tolerates  PVD/CAD --Continue aspirin 81 mg p.o. daily, Plavix 75 mg p.o. daily as pt tolerates  Essential hypertension --Continue with amlodipine 148mp.o. daily, metoprolol 10059mID -- Cont current regimen for now, anticipate bp to be better controlled once pt is weaned off steroids  Chronic pain syndrome --Continue Zanaflex, Ultram  Morbid obesity BMI 43.8.  -Recommend diet/lifestyle modification -Outpatient follow-up with PCP, considerations of bariatric medicine outpatient referral.  Discharge Instructions   Allergies as of 07/21/2020   No Known Allergies     Medication List    STOP taking these medications   insulin lispro 100 UNIT/ML KwikPen Commonly known as: HumaLOG KwikPen   Lantus SoloStar 100 UNIT/ML Solostar Pen Generic drug: insulin glargine Replaced by: insulin glargine 100 UNIT/ML injection     TAKE these medications   Accu-Chek Aviva device Use as instructed once daily E11.9   Accu-Chek Aviva Plus test strip Generic drug: glucose blood USE AS INSTRUCTED THREE TIMES DAILY. DX  E11.9   acetaminophen 500 MG tablet Commonly known as: TYLENOL Take 1-2 tablets (500-1,000 mg total) by mouth every 8 (eight) hours as needed for mild pain.   allopurinol 100 MG tablet Commonly known as: ZYLOPRIM Take 100 mg by mouth daily.   amLODipine 10 MG tablet Commonly known as: NORVASC TAKE 1 TABLET BY MOUTH EVERY DAY   aspirin 81 MG tablet Take 81 mg by mouth daily.   atorvastatin 80 MG tablet Commonly known as: LIPITOR TAKE 1 TABLET BY MOUTH DAILY AT 6PM What changed:   how much to take  how to take this  when to take this  additional instructions   B-12 PO Take 1 tablet by mouth daily.   BD Pen Needle Nano U/F 32G X 4 MM Misc Generic drug: Insulin Pen Needle USE DAILY WITH LANTUS SOLASTAR  E11.9   blood glucose meter kit and supplies Kit Dispense based on patient and insurance preference. Use up to three times daily as directed. (FOR ICD-9 250.00, 250.01).   calcitRIOL 0.5 MCG capsule Commonly known as: ROCALTROL Take 0.5 mcg by mouth daily.   clopidogrel 75 MG tablet Commonly known as: PLAVIX TAKE 1 TABLET BY MOUTH EVERY DAY   ferrous sulfate 325 (65 FE) MG tablet TAKE 1 TABLET BY MOUTH TWICE A DAY   fluticasone 50 MCG/ACT nasal spray Commonly known as: FLONASE Place 1 spray into both nostrils daily. Start taking on: July 22, 2020   furosemide 80 MG tablet Commonly known as: LASIX Take 0.5 tablets (40 mg total) by mouth 2 (two) times daily.   gabapentin 100 MG capsule Commonly known as: NEURONTIN TAKE 1 CAPSULE BY MOUTH THREE TIMES A DAY What changed: See the new instructions.   hydrOXYzine 10 MG tablet Commonly known as: ATARAX/VISTARIL Take 1 tablet (  10 mg total) by mouth 3 (three) times daily as needed for itching.   insulin aspart 100 UNIT/ML injection Commonly known as: novoLOG Inject 8 Units into the skin 3 (three) times daily with meals.   insulin glargine 100 UNIT/ML injection Commonly known as: LANTUS Inject 0.3 mLs  (30 Units total) into the skin daily. Start taking on: July 22, 2020 Replaces: Lantus SoloStar 100 UNIT/ML Solostar Pen   ipratropium-albuterol 0.5-2.5 (3) MG/3ML Soln Commonly known as: DUONEB Take 3 mLs by nebulization every 4 (four) hours as needed (sob or wheezing).   Lancets Misc Use as directed three times daily with meals   metoprolol tartrate 100 MG tablet Commonly known as: LOPRESSOR TAKE 1 TABLET BY MOUTH TWICE A DAY   Myrbetriq 25 MG Tb24 tablet Generic drug: mirabegron ER Take 25 mg by mouth daily.   nitroGLYCERIN 0.4 MG SL tablet Commonly known as: NITROSTAT Place 1 tablet (0.4 mg total) under the tongue every 5 (five) minutes as needed for chest pain. Reported on 04/06/2016   omeprazole 20 MG capsule Commonly known as: PRILOSEC TAKE 1 CAPSULE BY MOUTH EVERY DAY *APPT DUE IN FEB FOR REFILLS* What changed: See the new instructions.   polyethylene glycol powder 17 GM/SCOOP powder Commonly known as: GLYCOLAX/MIRALAX Take 17 gm by mouth daily What changed:   how much to take  how to take this  when to take this  reasons to take this  additional instructions   predniSONE 10 MG tablet Commonly known as: DELTASONE Take 4 tablets (40 mg total) by mouth daily for 2 days, THEN 2 tablets (20 mg total) daily for 2 days, THEN 1 tablet (10 mg total) daily for 2 days, THEN 0.5 tablets (5 mg total) daily for 2 days. Start taking on: July 22, 2020   QC TUMERIC COMPLEX PO Take 1 tablet by mouth daily.   risedronate 150 MG tablet Commonly known as: ACTONEL TAKE 1 TABLET (150 MG TOTAL) BY MOUTH EVERY 30 (THIRTY) DAYS. FIRST TUESDAY What changed: additional instructions   sodium bicarbonate 650 MG tablet Take 650 mg by mouth 2 (two) times daily.   timolol 0.5 % ophthalmic solution Commonly known as: TIMOPTIC Place 1 drop into both eyes daily.   tiZANidine 4 MG tablet Commonly known as: ZANAFLEX TAKE 1 TABLET BY MOUTH EVERY 6 HOURS AS NEEDED FOR MUSCLE  SPASMS What changed: See the new instructions.   traMADol 50 MG tablet Commonly known as: ULTRAM Take 1 tablet (50 mg total) by mouth every 6 (six) hours as needed. What changed: reasons to take this   traMADol 50 MG tablet Commonly known as: ULTRAM Take 1 tablet (50 mg total) by mouth every 6 (six) hours as needed for moderate pain. Prescription for home narcotic needed for SNF placement What changed: You were already taking a medication with the same name, and this prescription was added. Make sure you understand how and when to take each.   VISINE OP Place 2 drops into both eyes daily as needed (DRY EYE).   Vitamin D 50 MCG (2000 UT) tablet Take 2,000 Units by mouth daily.   zolpidem 5 MG tablet Commonly known as: AMBIEN TAKE 1 TABLET BY MOUTH EVERY DAY AT BEDTIME AS NEEDED FOR SLEEP What changed: See the new instructions.       No Known Allergies  Consultations:  ID  Vascular Surgery  Procedures/Studies: NM Pulmonary Perfusion  Result Date: 07/14/2020 CLINICAL DATA:  Positive D-dimer study.  Shortness of breath EXAM: NUCLEAR MEDICINE  PERFUSION LUNG SCAN TECHNIQUE: Perfusion images were obtained in multiple projections after intravenous injection of radiopharmaceutical. Views: Anterior, posterior, left lateral, right lateral, RPO, LPO, RAO, LAO RADIOPHARMACEUTICALS:  3.9 mCi Tc-74mMAA IV COMPARISON:  Chest radiograph November 23, 2017 FINDINGS: Radiotracer uptake is homogeneous and symmetric bilaterally. No perfusion defects evident. IMPRESSION: No evident perfusion defects. No findings indicative of pulmonary embolus. Electronically Signed   By: WLowella GripIII M.D.   On: 07/14/2020 14:42   DG CHEST PORT 1 VIEW  Result Date: 07/14/2020 CLINICAL DATA:  Shortness of breath EXAM: PORTABLE CHEST 1 VIEW COMPARISON:  July 11, 2020 FINDINGS: There is airspace opacity in portions of each mid and lower lung region, progressed from most recent study. No consolidation.  There is stable cardiomegaly with pulmonary vascularity within normal limits. No adenopathy. There is aortic atherosclerosis. No bone lesions. IMPRESSION: Progression of airspace opacity with airspace opacity currently in each mid and lower lung regions. No consolidation. Appearance felt to be most consistent with atypical organism pneumonia. Advise correlation with COVID-19 status in this regard. Stable cardiomegaly.  No adenopathy evident. Aortic Atherosclerosis (ICD10-I70.0). Electronically Signed   By: WLowella GripIII M.D.   On: 07/14/2020 09:19   DG CHEST PORT 1 VIEW  Result Date: 07/11/2020 CLINICAL DATA:  Dyspnea EXAM: PORTABLE CHEST 1 VIEW COMPARISON:  07/09/2020 chest radiograph. FINDINGS: Stable cardiomediastinal silhouette with mild cardiomegaly. No pneumothorax. No overt pulmonary edema. No pleural effusions. Stable mild bibasilar scarring versus atelectasis. IMPRESSION: 1. Stable mild cardiomegaly without overt pulmonary edema. 2. Stable mild bibasilar scarring versus atelectasis. Electronically Signed   By: JIlona SorrelM.D.   On: 07/11/2020 11:24   DG Chest Port 1 View  Result Date: 07/09/2020 CLINICAL DATA:  Sepsis EXAM: PORTABLE CHEST 1 VIEW COMPARISON:  January 02, 2019 FINDINGS: The cardiomediastinal silhouette is unchanged and enlarged in contour.Tortuous thoracic aorta. Atherosclerotic calcifications of the aorta. No pleural effusion. No pneumothorax. Scattered LEFT lower lobe atelectasis. iNo acute pleuroparenchymal abnormality. Visualized abdomen is unremarkable. Multilevel degenerative changes of the thoracic spine. IMPRESSION: Scattered LEFT lower lobe atelectasis. No acute cardiopulmonary abnormality. Electronically Signed   By: SValentino SaxonMD   On: 07/09/2020 12:17   VAS [place] WITH/WO TBI  Result Date: 07/10/2020 LOWER EXTREMITY DOPPLER STUDY Indications: Ulceration. High Risk Factors: Hypertension, hyperlipidemia, Diabetes.  Limitations: Today's exam was limited  due to patient intolerant to cuff              pressure, an open wound, bandages and involuntary patient movement. Comparison Study: 12/30/2019 - R Ivey L 0.76 Performing Technologist: CCarlos LeveringRVT  Examination Guidelines: A complete evaluation includes at minimum, Doppler waveform signals and systolic blood pressure reading at the level of bilateral brachial, anterior tibial, and posterior tibial arteries, when vessel segments are accessible. Bilateral testing is considered an integral part of a complete examination. Photoelectric Plethysmograph (PPG) waveforms and toe systolic pressure readings are included as required and additional duplex testing as needed. Limited examinations for reoccurring indications may be performed as noted.  ABI Findings: +---------+------------------+-----+---------+--------+ Right    Rt Pressure (mmHg)IndexWaveform Comment  +---------+------------------+-----+---------+--------+ Brachial 216                    triphasic         +---------+------------------+-----+---------+--------+ PTA      205               0.95 biphasic          +---------+------------------+-----+---------+--------+ DP  124               0.57 biphasic          +---------+------------------+-----+---------+--------+ Great Toe84                0.39                   +---------+------------------+-----+---------+--------+ +---------+------------------+-----+----------+-------+ Left     Lt Pressure (mmHg)IndexWaveform  Comment +---------+------------------+-----+----------+-------+ Brachial 201                    triphasic         +---------+------------------+-----+----------+-------+ PTA      105               0.49 monophasic        +---------+------------------+-----+----------+-------+ DP       110               0.51 monophasic        +---------+------------------+-----+----------+-------+ Great Toe45                0.21                    +---------+------------------+-----+----------+-------+ +-------+-----------+-----------+------------+------------+ ABI/TBIToday's ABIToday's TBIPrevious ABIPrevious TBI +-------+-----------+-----------+------------+------------+ Right  0.95       0.39       [place]          0.55         +-------+-----------+-----------+------------+------------+ Left   0.51       0.21       0.76        0.52         +-------+-----------+-----------+------------+------------+ Values are likely inacurate due to patient movement as a result of pain tolerance. The left ankle cuff was places just distal to the knee due to mid/disal calf wound and bandages.  Summary: Right: Resting right ankle-brachial index is within normal range. No evidence of significant right lower extremity arterial disease. The right toe-brachial index is abnormal. Left: Resting left ankle-brachial index indicates moderate left lower extremity arterial disease. The left toe-brachial index is abnormal.  *See table(s) above for measurements and observations.  Electronically signed by Servando Snare MD on 07/10/2020 at 12:06:11 PM.    Final    ECHOCARDIOGRAM COMPLETE  Result Date: 07/10/2020    ECHOCARDIOGRAM REPORT   Patient Name:   LAVADA LANGSAM Date of Exam: 07/10/2020 Medical Rec #:  [id number]          Height:       61.0 in Accession #:    [id number]         Weight:       231.0 lb Date of Birth:  1945/12/23           BSA:          2.008 m Patient Age:    74 years           BP:           173/71 mmHg Patient Gender: F                  HR:           78 bpm. Exam Location:  Inpatient Procedure: 2D Echo, Cardiac Doppler and Color Doppler Indications:    Bacteremia  History:        Patient has prior history of Echocardiogram examinations, most                 recent  08/14/2017. CAD, PVD, Signs/Symptoms:Shortness of Breath                 and Bacteremia; Risk Factors:Hypertension, Diabetes and                 Dyslipidemia.  Sonographer:    Dustin Flock Referring Phys: [id number] ERIC J [place]  Sonographer Comments: Patient is morbidly obese. Image acquisition challenging due to patient body habitus. IMPRESSIONS  1. Left ventricular ejection fraction, by estimation, is 55 to 60%. The left ventricle has normal function. The left ventricle has no regional wall motion abnormalities. Left ventricular diastolic function could not be evaluated.  2. Right ventricular systolic function is normal. The right ventricular size is normal. There is normal pulmonary artery systolic pressure. The estimated right ventricular systolic pressure is 00.9 mmHg.  3. The mitral valve is grossly normal. Mild to moderate mitral valve regurgitation. No evidence of mitral stenosis.  4. The aortic valve is tricuspid. There is mild calcification of the aortic valve. There is mild thickening of the aortic valve. Aortic valve regurgitation is not visualized. Mild aortic valve sclerosis is present, with no evidence of aortic valve stenosis.  5. The inferior vena cava is normal in size with greater than 50% respiratory variability, suggesting right atrial pressure of 3 mmHg. Conclusion(s)/Recommendation(s): No evidence of valvular vegetations on this transthoracic echocardiogram. Would recommend a transesophageal echocardiogram to exclude infective endocarditis if clinically indicated. FINDINGS  Left Ventricle: Left ventricular ejection fraction, by estimation, is 55 to 60%. The left ventricle has normal function. The left ventricle has no regional wall motion abnormalities. The left ventricular internal cavity size was normal in size. There is  no left ventricular hypertrophy. Left ventricular diastolic function could not be evaluated due to nondiagnostic images. Left ventricular diastolic function could not be evaluated. Right Ventricle: The right ventricular size is normal. No increase in right ventricular wall thickness. Right ventricular systolic function is normal. There is normal  pulmonary artery systolic pressure. The tricuspid regurgitant velocity is 2.48 m/s, and  with an assumed right atrial pressure of 3 mmHg, the estimated right ventricular systolic pressure is 23.3 mmHg. Left Atrium: Left atrial size was normal in size. Right Atrium: Right atrial size was normal in size. Pericardium: Trivial pericardial effusion is present. Presence of pericardial fat pad. Mitral Valve: The mitral valve is grossly normal. Mild to moderate mitral valve regurgitation. No evidence of mitral valve stenosis. Tricuspid Valve: The tricuspid valve is grossly normal. Tricuspid valve regurgitation is trivial. No evidence of tricuspid stenosis. Aortic Valve: The aortic valve is tricuspid. There is mild calcification of the aortic valve. There is mild thickening of the aortic valve. Aortic valve regurgitation is not visualized. Mild aortic valve sclerosis is present, with no evidence of aortic valve stenosis. Pulmonic Valve: The pulmonic valve was grossly normal. Pulmonic valve regurgitation is trivial. No evidence of pulmonic stenosis. Aorta: The aortic root and ascending aorta are structurally normal, with no evidence of dilitation. Venous: The inferior vena cava is normal in size with greater than 50% respiratory variability, suggesting right atrial pressure of 3 mmHg. IAS/Shunts: The atrial septum is grossly normal.  LEFT VENTRICLE PLAX 2D LVIDd:         4.40 cm  Diastology LVIDs:         3.10 cm  LV e' medial:    6.64 cm/s LV PW:         1.00 cm  LV E/e' medial:  21.2 LV IVS:  1.00 cm  LV e' lateral:   7.40 cm/s LVOT diam:     2.20 cm  LV E/e' lateral: 19.1 LV SV:         90 LV SV Index:   45 LVOT Area:     3.80 cm  RIGHT VENTRICLE             IVC RV Basal diam:  3.40 cm     IVC diam: 1.80 cm RV S prime:     12.30 cm/s TAPSE (M-mode): 2.0 cm LEFT ATRIUM             Index       RIGHT ATRIUM           Index LA diam:        4.40 cm 2.19 cm/m  RA Area:     15.90 cm LA Vol (A2C):   32.2 ml 16.04 ml/m RA  Volume:   39.80 ml  19.82 ml/m LA Vol (A4C):   41.6 ml 20.72 ml/m LA Biplane Vol: 37.8 ml 18.82 ml/m  AORTIC VALVE LVOT Vmax:   92.00 cm/s LVOT Vmean:  68.900 cm/s LVOT VTI:    0.237 m  AORTA Ao Root diam: 2.80 cm Ao Asc diam:  2.80 cm MITRAL VALVE                TRICUSPID VALVE MV Area (PHT): 3.03 cm     TR Peak grad:   24.6 mmHg MV Decel Time: 250 msec     TR Vmax:        248.00 cm/s MV E velocity: 141.00 cm/s MV A velocity: 135.00 cm/s  SHUNTS MV E/A ratio:  1.04         Systemic VTI:  0.24 m                             Systemic Diam: 2.20 cm Eleonore Chiquito MD Electronically signed by Eleonore Chiquito MD Signature Date/Time: 07/10/2020/11:10:30 AM    Final    VAS [place] LOWER EXTREMITY VENOUS (DVT)  Result Date: 07/13/2020  Lower Venous DVTStudy Indications: Edema, and elevated ddimer.  Comparison Study: 07/26/18 previous Performing Technologist: Abram Sander RVS  Examination Guidelines: A complete evaluation includes B-mode imaging, spectral Doppler, color Doppler, and power Doppler as needed of all accessible portions of each vessel. Bilateral testing is considered an integral part of a complete examination. Limited examinations for reoccurring indications may be performed as noted. The reflux portion of the exam is performed with the patient in reverse Trendelenburg.  +---------+---------------+---------+-----------+----------+--------------+ RIGHT    CompressibilityPhasicitySpontaneityPropertiesThrombus Aging +---------+---------------+---------+-----------+----------+--------------+ CFV      Full           Yes      Yes                                 +---------+---------------+---------+-----------+----------+--------------+ SFJ      Full                                                        +---------+---------------+---------+-----------+----------+--------------+ FV Prox  Full                                                         +---------+---------------+---------+-----------+----------+--------------+  FV Mid   Full                                                        +---------+---------------+---------+-----------+----------+--------------+ FV DistalFull                                                        +---------+---------------+---------+-----------+----------+--------------+ PFV      Full                                                        +---------+---------------+---------+-----------+----------+--------------+ POP      Full           Yes      Yes                                 +---------+---------------+---------+-----------+----------+--------------+ PTV      Full                                                        +---------+---------------+---------+-----------+----------+--------------+ PERO                                                  Not visualized +---------+---------------+---------+-----------+----------+--------------+   +---------+---------------+---------+-----------+----------+--------------+ LEFT     CompressibilityPhasicitySpontaneityPropertiesThrombus Aging +---------+---------------+---------+-----------+----------+--------------+ CFV      Full           Yes      Yes                                 +---------+---------------+---------+-----------+----------+--------------+ SFJ      Full                                                        +---------+---------------+---------+-----------+----------+--------------+ FV Prox  Full                                                        +---------+---------------+---------+-----------+----------+--------------+ FV Mid                  Yes      Yes                                 +---------+---------------+---------+-----------+----------+--------------+  FV Distal               Yes      Yes                                  +---------+---------------+---------+-----------+----------+--------------+ PFV      Full                                                        +---------+---------------+---------+-----------+----------+--------------+ POP      Full                                                        +---------+---------------+---------+-----------+----------+--------------+ PTV                                                   Not visualized +---------+---------------+---------+-----------+----------+--------------+ PERO                                                  Not visualized +---------+---------------+---------+-----------+----------+--------------+     Summary: BILATERAL: - No evidence of deep vein thrombosis seen in the lower extremities, bilaterally. - No evidence of superficial venous thrombosis in the lower extremities, bilaterally. - RIGHT: - A cystic structure is found in the popliteal fossa.  LEFT: - No cystic structure found in the popliteal fossa.  *See table(s) above for measurements and observations. Electronically signed by Ruta Hinds MD on 07/13/2020 at 5:15:24 PM.    Final     Subjective: Without complaints  Discharge Exam: Vitals:   07/21/20 0300 07/21/20 0809  BP: 134/73 122/75  Pulse: 63 94  Resp: 15 17  Temp: 98.2 F (36.8 C) 98.2 F (36.8 C)  SpO2: 94% 96%   Vitals:   07/20/20 1920 07/20/20 2156 07/21/20 0300 07/21/20 0809  BP: (!) 140/59  134/73 122/75  Pulse: 67 (!) 59 63 94  Resp: _0 Temp: 97.8 F (36.6 C)  98.2 F (36.8 C) 98.2 F (36.8 C)  TempSrc: Oral  Axillary Oral  SpO2: 96%  94% 96%  Weight:      Height:        General: Pt is alert, awake, not in acute distress Cardiovascular: RRR, S1/S2 +, no rubs, no gallops Respiratory: CTA bilaterally, no wheezing, no rhonchi Abdominal: Soft, NT, ND, bowel sounds + Extremities: no edema, no cyanosis   The results of significant diagnostics from this hospitalization  (including imaging, microbiology, ancillary and laboratory) are listed below for reference.     Microbiology: Recent Results (from the past 240 hour(s))  SARS Coronavirus 2 by RT PCR (hospital order, performed in [facility] hospital lab) Nasopharyngeal Nasopharyngeal Swab     Status: None   Collection Time: 07/14/20  5:33 AM   Specimen: Nasopharyngeal Swab  Result Value Ref Range Status  SARS Coronavirus 2 NEGATIVE NEGATIVE Final    Comment: (NOTE) SARS-CoV-2 target nucleic acids are NOT DETECTED.  The SARS-CoV-2 RNA is generally detectable in upper and lower respiratory specimens during the acute phase of infection. The lowest concentration of SARS-CoV-2 viral copies this assay can detect is 250 copies / mL. A negative result does not preclude SARS-CoV-2 infection and should not be used as the sole basis for treatment or other patient management decisions.  A negative result may occur with improper specimen collection / handling, submission of specimen other than nasopharyngeal swab, presence of viral mutation(s) within the areas targeted by this assay, and inadequate number of viral copies (<250 copies / mL). A negative result must be combined with clinical observations, patient history, and epidemiological information.  Fact Sheet for Patients:   StrictlyIdeas.no  Fact Sheet for Healthcare Providers: BankingDealers.co.za  This test is not yet approved or  cleared by the [place] FDA and has been authorized for detection and/or diagnosis of SARS-CoV-2 by FDA under an Emergency Use Authorization (EUA).  This EUA will remain in effect (meaning this test can be used) for the duration of the COVID-19 declaration under Section 564(b)(1) of the Act, 21 U.S.C. section 360bbb-3(b)(1), unless the authorization is terminated or revoked sooner.  Performed at [facility], [place] [address], [place], [place] [address]   SARS  Coronavirus 2 by RT PCR (hospital order, performed in [facility] hospital lab)     Status: None   Collection Time: 07/20/20 11:47 AM  Result Value Ref Range Status   SARS Coronavirus 2 NEGATIVE NEGATIVE Final    Comment: (NOTE) SARS-CoV-2 target nucleic acids are NOT DETECTED.  The SARS-CoV-2 RNA is generally detectable in upper and lower respiratory specimens during the acute phase of infection. The lowest concentration of SARS-CoV-2 viral copies this assay can detect is 250 copies / mL. A negative result does not preclude SARS-CoV-2 infection and should not be used as the sole basis for treatment or other patient management decisions.  A negative result may occur with improper specimen collection / handling, submission of specimen other than nasopharyngeal swab, presence of viral mutation(s) within the areas targeted by this assay, and inadequate number of viral copies (<250 copies / mL). A negative result must be combined with clinical observations, patient history, and epidemiological information.  Fact Sheet for Patients:   StrictlyIdeas.no  Fact Sheet for Healthcare Providers: BankingDealers.co.za  This test is not yet approved or  cleared by the [place] FDA and has been authorized for detection and/or diagnosis of SARS-CoV-2 by FDA under an Emergency Use Authorization (EUA).  This EUA will remain in effect (meaning this test can be used) for the duration of the COVID-19 declaration under Section 564(b)(1) of the Act, 21 U.S.C. section 360bbb-3(b)(1), unless the authorization is terminated or revoked sooner.  Performed at [facility], [place] [address] [place], [place] [address]      Labs: BNP (last 3 results) No results for input(s): BNP in the last 8760 hours. Basic Metabolic Panel: Recent Labs  Lab 07/15/20 1006 07/16/20 0505 07/17/20 0453 07/19/20 0229 07/20/20 1430  NA 138 137 134* 134* 136  K 3.5  4.9 4.4 4.9 4.7  CL 102 104 98 102 104  CO2 22 17* 21* 20* 19*  GLUCOSE 157* 195* 317* 225* 268*  BUN 41* 47* 58* 80* 95*  CREATININE 2.36* 2.41* 2.58* 2.42* 2.70*  CALCIUM 9.2 8.8* 8.7* 8.8* 9.2  MG 2.0  --   --   --   --  Liver Function Tests: Recent Labs  Lab 07/16/20 0505 07/17/20 0453 07/19/20 0229  AST _0 ALT _1 ALKPHOS 147* 132* 128*  BILITOT 1.2 0.6 0.5  PROT 6.4* 6.2* 5.9*  ALBUMIN 2.0* 2.1* 2.2*   No results for input(s): LIPASE, AMYLASE in the last 168 hours. No results for input(s): AMMONIA in the last 168 hours. CBC: Recent Labs  Lab 07/15/20 1006 07/16/20 0505 07/17/20 0453  WBC 16.0* 12.3* 13.7*  NEUTROABS 11.4*  --   --   HGB 10.4* 10.4* 9.6*  HCT 31.7* 31.3* 28.5*  MCV 82.1 81.7 82.1  PLT 483* 471* 473*   Cardiac Enzymes: No results for input(s): CKTOTAL, CKMB, CKMBINDEX, TROPONINI in the last 168 hours. BNP: Invalid input(s): POCBNP CBG: Recent Labs  Lab 07/20/20 1217 07/20/20 1653 07/20/20 2028 07/21/20 0636 07/21/20 1111  GLUCAP 207* 234* 304* 225* 136*   D-Dimer No results for input(s): DDIMER in the last 72 hours. Hgb A1c No results for input(s): HGBA1C in the last 72 hours. Lipid Profile No results for input(s): CHOL, HDL, LDLCALC, TRIG, CHOLHDL, LDLDIRECT in the last 72 hours. Thyroid function studies No results for input(s): TSH, T4TOTAL, T3FREE, THYROIDAB in the last 72 hours.  Invalid input(s): FREET3 Anemia work up No results for input(s): VITAMINB12, FOLATE, FERRITIN, TIBC, IRON, RETICCTPCT in the last 72 hours. Urinalysis    Component Value Date/Time   COLORURINE YELLOW 06/26/2020 1622   APPEARANCEUR CLEAR 06/26/2020 1622   LABSPEC 1.006 06/26/2020 1622   PHURINE 6.5 06/26/2020 1622   GLUCOSEU NEGATIVE 06/26/2020 1622   GLUCOSEU NEGATIVE 07/08/2019 1402   HGBUR NEGATIVE 06/26/2020 1622   BILIRUBINUR NEGATIVE 07/08/2019 1402   KETONESUR NEGATIVE 06/26/2020 1622   PROTEINUR 2+ (A) 06/26/2020 1622    UROBILINOGEN 0.2 07/08/2019 1402   NITRITE NEGATIVE 06/26/2020 1622   LEUKOCYTESUR NEGATIVE 06/26/2020 1622   Sepsis Labs Invalid input(s): PROCALCITONIN,  WBC,  LACTICIDVEN Microbiology Recent Results (from the past 240 hour(s))  SARS Coronavirus 2 by RT PCR (hospital order, performed in [facility] hospital lab) Nasopharyngeal Nasopharyngeal Swab     Status: None   Collection Time: 07/14/20  5:33 AM   Specimen: Nasopharyngeal Swab  Result Value Ref Range Status   SARS Coronavirus 2 NEGATIVE NEGATIVE Final    Comment: (NOTE) SARS-CoV-2 target nucleic acids are NOT DETECTED.  The SARS-CoV-2 RNA is generally detectable in upper and lower respiratory specimens during the acute phase of infection. The lowest concentration of SARS-CoV-2 viral copies this assay can detect is 250 copies / mL. A negative result does not preclude SARS-CoV-2 infection and should not be used as the sole basis for treatment or other patient management decisions.  A negative result may occur with improper specimen collection / handling, submission of specimen other than nasopharyngeal swab, presence of viral mutation(s) within the areas targeted by this assay, and inadequate number of viral copies (<250 copies / mL). A negative result must be combined with clinical observations, patient history, and epidemiological information.  Fact Sheet for Patients:   StrictlyIdeas.no  Fact Sheet for Healthcare Providers: BankingDealers.co.za  This test is not yet approved or  cleared by the [place] FDA and has been authorized for detection and/or diagnosis of SARS-CoV-2 by FDA under an Emergency Use Authorization (EUA).  This EUA will remain in effect (meaning this test can be used) for the duration of the COVID-19 declaration under Section 564(b)(1) of the Act, 21 U.S.C. section 360bbb-3(b)(1), unless the authorization is terminated or  revoked sooner.  Performed  at [facility], [place] [address], [place], [place] [address]   SARS Coronavirus 2 by RT PCR (hospital order, performed in [facility] hospital lab)     Status: None   Collection Time: 07/20/20 11:47 AM  Result Value Ref Range Status   SARS Coronavirus 2 NEGATIVE NEGATIVE Final    Comment: (NOTE) SARS-CoV-2 target nucleic acids are NOT DETECTED.  The SARS-CoV-2 RNA is generally detectable in upper and lower respiratory specimens during the acute phase of infection. The lowest concentration of SARS-CoV-2 viral copies this assay can detect is 250 copies / mL. A negative result does not preclude SARS-CoV-2 infection and should not be used as the sole basis for treatment or other patient management decisions.  A negative result may occur with improper specimen collection / handling, submission of specimen other than nasopharyngeal swab, presence of viral mutation(s) within the areas targeted by this assay, and inadequate number of viral copies (<250 copies / mL). A negative result must be combined with clinical observations, patient history, and epidemiological information.  Fact Sheet for Patients:   StrictlyIdeas.no  Fact Sheet for Healthcare Providers: BankingDealers.co.za  This test is not yet approved or  cleared by the [place] FDA and has been authorized for detection and/or diagnosis of SARS-CoV-2 by FDA under an Emergency Use Authorization (EUA).  This EUA will remain in effect (meaning this test can be used) for the duration of the COVID-19 declaration under Section 564(b)(1) of the Act, 21 U.S.C. section 360bbb-3(b)(1), unless the authorization is terminated or revoked sooner.  Performed at [facility], [place] [address] [place], [place] [address]    Time spent: 30 min  SIGNED:   Marylu Lund, MD  [facility] 07/21/2020, 12:03 PM  If 7PM-7AM, please contact night-coverage

## 2020-07-21 NOTE — Progress Notes (Signed)
Attempted report twice and no answer. Will try again later if time permits

## 2020-07-21 NOTE — Progress Notes (Signed)
Nutrition Follow-up  DOCUMENTATION CODES:   Obesity unspecified  INTERVENTION:   -Continue MVI with minerals daily -Continue 30 ml Prosource Plus BID, each supplement provides 100 kcals and 15 grams protein  NUTRITION DIAGNOSIS:   Increased nutrient needs related to wound healing as evidenced by estimated needs.  Ongoing  GOAL:   Patient will meet greater than or equal to 90% of their needs  Progressing   MONITOR:   PO intake, Supplement acceptance, Labs, Weight trends, Skin, I & O's  REASON FOR ASSESSMENT:   Consult Wound healing  ASSESSMENT:   [name] is a 74 y.o. female with medical history significant of PVD; HTN; HLD; DM; CKD; and CAD presenting with SOB.  She reports that she has been feeling SOB and kind of weak for a couple of days.  She was seen by Dr. [name] on 9/3 and he provided antibiotics but she isn't sure why.  She did vomit once yesterday.  No diarrhea.  She is hurting a little in her joints, "all of them."  No fever.  No urinary symptoms.  No cough.  Reviewed I/O's: +723 ml x 24 hours and +6.4 L since admission  Attempted to speak with pt via call to hospital room phone, however, unable to reach.   Pt remains with good appetite; noted meal completion 100%. Pt continues to take Prosource Plus supplements well.  Medications reviewed and include IV solu-medrol.   Per TOC notes, plan to d/c to SNF once medically stable.   Labs reviewed: CBGS: 225-304 (inpatient orders for glycemic control are 0-20 units insulin aspart TID with meals, 0-5 units insulin aspart daily at bedtime, 8 units insulin aspart TID with meals, and 30 units insulin glargine BID ).   Diet Order:   Diet Order            Diet renal/carb modified with fluid restriction Diet-HS Snack? Nothing; Room service appropriate? 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  Estimated Nutritional Needs:   Kcal:  1700-1900  Protein:  105-120 grams  Fluid:  1.7-1.9 L    Loistine Chance, RD, LDN, [place] Registered Dietitian II Certified Diabetes Care and Education Specialist Please refer to [facility] for RD and/or RD on-call/weekend/after hours pager

## 2020-07-21 NOTE — TOC Transition Note (Signed)
Transition of Care [facility]) - CM/SW Discharge Note   Patient Details  Name: [name] MRN: [id number] Date of Birth: 1946/08/05  [facility]) CM/SW Contact:  Curlene Labrum, RN Phone Number: 07/21/2020, 12:52 PM   Clinical Narrative:    Case management placed discharge summary and clinicals in the hub for [facility] healthcare SNF.  I called and spoke with the daughter and notified her that the patient would be transferred to [facility] via [facility] in about an hour.  The nurse, Georgina Peer, RN is to call report to [facility] healthcare at [phone].   Final next level of care: Skilled Nursing Facility Barriers to Discharge: Continued Medical Work up   Patient Goals and CMS Choice Patient states their goals for this hospitalization and ongoing recovery are:: Patient's daughter states that she is unabe to take care of her at home due to health issues - requesting SNf placement. CMS Medicare.gov Compare Post Acute Care list provided to:: Patient Represenative (must comment) (daughter, [name]) Choice offered to / list presented to : [place] / [place]  Discharge Placement                       Discharge Plan and Services   Discharge Planning Services: CM Consult                                 Social Determinants of Health (SDOH) Interventions     Readmission Risk Interventions Readmission Risk Prevention Plan 07/10/2020  Transportation Screening Complete  PCP or Specialist Appt within 3-5 Days Complete  HRI or [place] Complete  Social Work Consult for [place] Planning/Counseling Complete  Palliative Care Screening Complete  Medication Review [employer]) Complete  Some recent data might be hidden

## 2020-07-21 NOTE — Plan of Care (Signed)
°  Problem: Education: Goal: Knowledge of General Education information will improve Description: Including pain rating scale, medication(s)/side effects and non-pharmacologic comfort measures Outcome: Progressing   Problem: Health Behavior/Discharge Planning: Goal: Ability to manage health-related needs will improve Outcome: Progressing   Problem: Nutrition: Goal: Adequate nutrition will be maintained Outcome: Progressing   Problem: Pain Managment: Goal: General experience of comfort will improve Outcome: Progressing   Problem: Safety: Goal: Ability to remain free from injury will improve Outcome: Progressing   Problem: Skin Integrity: Goal: Risk for impaired skin integrity will decrease Outcome: Progressing   

## 2020-07-23 DIAGNOSIS — J449 Chronic obstructive pulmonary disease, unspecified: Secondary | ICD-10-CM | POA: Diagnosis not present

## 2020-07-23 DIAGNOSIS — J9601 Acute respiratory failure with hypoxia: Secondary | ICD-10-CM | POA: Diagnosis not present

## 2020-07-23 DIAGNOSIS — I83028 Varicose veins of left lower extremity with ulcer other part of lower leg: Secondary | ICD-10-CM | POA: Diagnosis not present

## 2020-07-23 DIAGNOSIS — E1122 Type 2 diabetes mellitus with diabetic chronic kidney disease: Secondary | ICD-10-CM | POA: Diagnosis not present

## 2020-07-23 DIAGNOSIS — I10 Essential (primary) hypertension: Secondary | ICD-10-CM | POA: Diagnosis not present

## 2020-07-24 ENCOUNTER — Encounter (HOSPITAL_COMMUNITY): Payer: Medicare Other

## 2020-07-24 ENCOUNTER — Ambulatory Visit: Payer: Medicare Other | Admitting: *Deleted

## 2020-07-24 DIAGNOSIS — L03116 Cellulitis of left lower limb: Secondary | ICD-10-CM | POA: Diagnosis not present

## 2020-07-24 DIAGNOSIS — E1122 Type 2 diabetes mellitus with diabetic chronic kidney disease: Secondary | ICD-10-CM | POA: Diagnosis not present

## 2020-07-24 DIAGNOSIS — I739 Peripheral vascular disease, unspecified: Secondary | ICD-10-CM | POA: Diagnosis not present

## 2020-07-24 DIAGNOSIS — R0989 Other specified symptoms and signs involving the circulatory and respiratory systems: Secondary | ICD-10-CM | POA: Diagnosis not present

## 2020-07-24 DIAGNOSIS — Z66 Do not resuscitate: Secondary | ICD-10-CM | POA: Diagnosis not present

## 2020-07-27 DIAGNOSIS — N189 Chronic kidney disease, unspecified: Secondary | ICD-10-CM | POA: Diagnosis not present

## 2020-07-27 DIAGNOSIS — R0989 Other specified symptoms and signs involving the circulatory and respiratory systems: Secondary | ICD-10-CM | POA: Diagnosis not present

## 2020-07-27 DIAGNOSIS — E1165 Type 2 diabetes mellitus with hyperglycemia: Secondary | ICD-10-CM | POA: Diagnosis not present

## 2020-07-27 DIAGNOSIS — I10 Essential (primary) hypertension: Secondary | ICD-10-CM | POA: Diagnosis not present

## 2020-07-27 DIAGNOSIS — A419 Sepsis, unspecified organism: Secondary | ICD-10-CM | POA: Diagnosis not present

## 2020-07-27 DIAGNOSIS — I739 Peripheral vascular disease, unspecified: Secondary | ICD-10-CM | POA: Diagnosis not present

## 2020-07-27 DIAGNOSIS — L03116 Cellulitis of left lower limb: Secondary | ICD-10-CM | POA: Diagnosis not present

## 2020-07-27 DIAGNOSIS — E785 Hyperlipidemia, unspecified: Secondary | ICD-10-CM | POA: Diagnosis not present

## 2020-07-27 DIAGNOSIS — E119 Type 2 diabetes mellitus without complications: Secondary | ICD-10-CM | POA: Diagnosis not present

## 2020-07-30 DIAGNOSIS — I83028 Varicose veins of left lower extremity with ulcer other part of lower leg: Secondary | ICD-10-CM | POA: Diagnosis not present

## 2020-07-31 DIAGNOSIS — R062 Wheezing: Secondary | ICD-10-CM | POA: Diagnosis not present

## 2020-07-31 DIAGNOSIS — R0602 Shortness of breath: Secondary | ICD-10-CM | POA: Diagnosis not present

## 2020-08-04 ENCOUNTER — Telehealth: Payer: Self-pay | Admitting: Internal Medicine
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
## 2020-08-05 DIAGNOSIS — I872 Venous insufficiency (chronic) (peripheral): Secondary | ICD-10-CM | POA: Diagnosis not present

## 2020-08-05 DIAGNOSIS — E1151 Type 2 diabetes mellitus with diabetic peripheral angiopathy without gangrene: Secondary | ICD-10-CM | POA: Diagnosis not present

## 2020-08-05 DIAGNOSIS — I120 Hypertensive chronic kidney disease with stage 5 chronic kidney disease or end stage renal disease: Secondary | ICD-10-CM | POA: Diagnosis not present

## 2020-08-05 DIAGNOSIS — L97822 Non-pressure chronic ulcer of other part of left lower leg with fat layer exposed: Secondary | ICD-10-CM | POA: Diagnosis not present

## 2020-08-05 DIAGNOSIS — L03116 Cellulitis of left lower limb: Secondary | ICD-10-CM | POA: Diagnosis not present

## 2020-08-06 ENCOUNTER — Telehealth: Payer: Self-pay | Admitting: Internal Medicine

## 2020-08-06 DIAGNOSIS — I872 Venous insufficiency (chronic) (peripheral): Secondary | ICD-10-CM | POA: Diagnosis not present

## 2020-08-06 DIAGNOSIS — L97822 Non-pressure chronic ulcer of other part of left lower leg with fat layer exposed: Secondary | ICD-10-CM | POA: Diagnosis not present

## 2020-08-06 DIAGNOSIS — L03116 Cellulitis of left lower limb: Secondary | ICD-10-CM | POA: Diagnosis not present

## 2020-08-06 DIAGNOSIS — E1151 Type 2 diabetes mellitus with diabetic peripheral angiopathy without gangrene: Secondary | ICD-10-CM | POA: Diagnosis not present

## 2020-08-06 DIAGNOSIS — I120 Hypertensive chronic kidney disease with stage 5 chronic kidney disease or end stage renal disease: Secondary | ICD-10-CM | POA: Diagnosis not present

## 2020-08-06 NOTE — Telephone Encounter (Signed)
Diane with [facility] calling to request nursing orders for home health, social work, home health aide, physical and occupational will call for orders. Has concerns about patients safety. She fell twice when she came home trying to go up steps to her bedroom and now she cannot get back down. Patient also having some confusion, believes it is new. Stating she would like to speak with someone about the patient Diane- [phone] Voice mails okay

## 2020-08-07 DIAGNOSIS — E1151 Type 2 diabetes mellitus with diabetic peripheral angiopathy without gangrene: Secondary | ICD-10-CM | POA: Diagnosis not present

## 2020-08-07 DIAGNOSIS — L03116 Cellulitis of left lower limb: Secondary | ICD-10-CM | POA: Diagnosis not present

## 2020-08-07 DIAGNOSIS — L97822 Non-pressure chronic ulcer of other part of left lower leg with fat layer exposed: Secondary | ICD-10-CM | POA: Diagnosis not present

## 2020-08-07 DIAGNOSIS — I872 Venous insufficiency (chronic) (peripheral): Secondary | ICD-10-CM | POA: Diagnosis not present

## 2020-08-07 DIAGNOSIS — I120 Hypertensive chronic kidney disease with stage 5 chronic kidney disease or end stage renal disease: Secondary | ICD-10-CM | POA: Diagnosis not present

## 2020-08-07 NOTE — Telephone Encounter (Signed)
Unfortunately we have to recommend pt to go to ED, as she may need permanent placement, if this is happening just after rehab

## 2020-08-07 NOTE — Telephone Encounter (Signed)
Ok for verbals 

## 2020-08-07 NOTE — Telephone Encounter (Signed)
Spoke with pts daughter and Her nurse Diane today. Was able to inform Dr. [name] of their concerns and pts vitals at the time of my call.

## 2020-08-07 NOTE — Telephone Encounter (Signed)
Follow up message    Please call [name] with [facility], she wants to discuss insulin/ insulin dosage

## 2020-08-10 ENCOUNTER — Telehealth: Payer: Self-pay | Admitting: Internal Medicine

## 2020-08-10 ENCOUNTER — Ambulatory Visit: Payer: Medicare Other | Admitting: Podiatry

## 2020-08-10 DIAGNOSIS — E1151 Type 2 diabetes mellitus with diabetic peripheral angiopathy without gangrene: Secondary | ICD-10-CM | POA: Diagnosis not present

## 2020-08-10 DIAGNOSIS — L97822 Non-pressure chronic ulcer of other part of left lower leg with fat layer exposed: Secondary | ICD-10-CM | POA: Diagnosis not present

## 2020-08-10 DIAGNOSIS — L03116 Cellulitis of left lower limb: Secondary | ICD-10-CM | POA: Diagnosis not present

## 2020-08-10 DIAGNOSIS — S81802A Unspecified open wound, left lower leg, initial encounter: Secondary | ICD-10-CM | POA: Diagnosis not present

## 2020-08-10 DIAGNOSIS — I872 Venous insufficiency (chronic) (peripheral): Secondary | ICD-10-CM | POA: Diagnosis not present

## 2020-08-10 DIAGNOSIS — I120 Hypertensive chronic kidney disease with stage 5 chronic kidney disease or end stage renal disease: Secondary | ICD-10-CM | POA: Diagnosis not present

## 2020-08-10 NOTE — Telephone Encounter (Signed)
I have spoken with pts daughter and I have informed her of Dr. Gwynn Burly advice. Pts daughter states there is a Doctor, hospital coming out today to help recommend a permanent residence for the pt. Pt has a virtual visit on 08/11/2020 with Dr. [name] at 2:45.  **Pts daughter wanted to wait until she has the virtual visit with Dr. [name] and the visit from the social worker on today.

## 2020-08-10 NOTE — Telephone Encounter (Signed)
[name] with [facility] called and said that the patients blood sugar was 58 when she woke up this morning. Also, Glenard Haring said the patient told her she drank and ate something and it went to 41, while Glenard Haring was there it was checked and it was 92.  The patient said that she was reporting neck pain on pain scale 8-10. The patient and the daughter confirmed that she slept in the chair wrong and reports the pain is getting worse. Glenard Haring told the patient to take tylenol to help with the pain. The patient was alert and oriented when [facility] saw her earlier this afternoon.    Please call Glenard Haring back: [phone]

## 2020-08-10 NOTE — Telephone Encounter (Signed)
LVM to please call the office back asap to discuss questions on pts insulin.

## 2020-08-10 NOTE — Telephone Encounter (Signed)
I was able to [facility] of pts insulin names and directions on quantity and frequency.

## 2020-08-11 ENCOUNTER — Telehealth (INDEPENDENT_AMBULATORY_CARE_PROVIDER_SITE_OTHER): Payer: Medicare Other | Admitting: Internal Medicine

## 2020-08-11 DIAGNOSIS — L97909 Non-pressure chronic ulcer of unspecified part of unspecified lower leg with unspecified severity: Secondary | ICD-10-CM | POA: Diagnosis not present

## 2020-08-11 DIAGNOSIS — E118 Type 2 diabetes mellitus with unspecified complications: Secondary | ICD-10-CM | POA: Diagnosis not present

## 2020-08-11 DIAGNOSIS — I10 Essential (primary) hypertension: Secondary | ICD-10-CM

## 2020-08-11 DIAGNOSIS — I83009 Varicose veins of unspecified lower extremity with ulcer of unspecified site: Secondary | ICD-10-CM

## 2020-08-11 DIAGNOSIS — R0600 Dyspnea, unspecified: Secondary | ICD-10-CM

## 2020-08-11 NOTE — Progress Notes (Signed)
Patient ID: [name], female   DOB: 03/30/1946, 74 y.o.   MRN: [id number]  Virtual Visit via Video Note  I connected with Rolanda Lundborg Rodier on 08/11/20 at  2:40 PM EDT by a video enabled telemedicine application and verified that I am speaking with the correct person using two identifiers.  Location of all participants today Patient: at home Provider: at office   I discussed the limitations of evaluation and management by telemedicine and the availability of in person appointments. The patient expressed understanding and agreed to proceed.  History of Present Illness: Here to f/u post hospn for LLE celluitis now resolved and much less feeling ill, weakness, and myalgias . On arrival to the ED, patient was noted to have elevated WBC count of 16.4, lactic acid of 2.3, CRP 27.7 with a procalcitonin of 59.95. Blood cultures x2+ for gram-positive cocci in chains with BCID notable for Streptococcus species. TTE with LVEF 50-60%, normal LV function, normal RV systolic function, no evidence of valvular vegetations on TTE. Pt completed 7 day course of antibiotics per ID.  Also noted acute hypoxic resp failure and likely underlying new dx of COPD, as v/q neg, now weaned off o2 and improved wheezing/sob with neb, steroids.  Pt due for referral to Pulm for PFTs. Also, in hospital Case was discussed with vascular surgery, Dr. Lurline Hare by admitting physician who recommended venous reflux study and placement of Unna boots by wound care if positive, unfortunately per ultrasound technician, only perform that exam outpatient. Vascular ultrasound ABI demonstrating moderate arterial disease left lower extremity.  Pt plans to f/u with vascular surgury.  CKD 5 - stable and sod bicarb added, and calcitriol.  No new complaints Past Medical History:  Diagnosis Date  . Anemia, iron deficiency   . Aortic atherosclerosis ([place]) 11/17/2017  . Arthritis   . CAD (coronary artery disease)   . Carotid stenosis    Carotid  US (02/2014):  Bilateral ICA 40-59%; > 50% L ECA; F/u 1 year  . Cataract    removed both eyes  . Cholelithiasis 11/17/2017  . Chronic kidney disease    chronic renal failure  . DM2 (diabetes mellitus, type 2) ([place])   . GERD (gastroesophageal reflux disease)   . Glaucoma   . HLD (hyperlipidemia)   . HTN (hypertension)   . Hx of cardiovascular stress test    Lexiscan Myoview (03/24/14):  No ischemia, EF 73%, Normal  . Hypoglycemia 06/27/2017  . Mitral regurgitation   . Osteoporosis   . PUD (peptic ulcer disease)   . PVD (peripheral vascular disease) ([place])   . Shortness of breath    with exertion   Past Surgical History:  Procedure Laterality Date  . ABDOMINAL AORTOGRAM W/LOWER EXTREMITY N/A 08/13/2018   Procedure: ABDOMINAL AORTOGRAM W/LOWER EXTREMITY;  Surgeon: Waynetta Sandy, MD;  Location: [facility];  Service: Cardiovascular;  Laterality: N/A;  . ABDOMINAL HYSTERECTOMY    . APPENDECTOMY    . AV FISTULA PLACEMENT Right 01/18/2013   Procedure: ARTERIOVENOUS (AV) FISTULA CREATION;  Surgeon: Rosetta Posner, MD;  Location: [facility];  Service: Vascular;  Laterality: Right;  Ultrasound guided  . COLONOSCOPY    . CORONARY ANGIOPLASTY WITH STENT PLACEMENT    . PERIPHERAL VASCULAR INTERVENTION Bilateral 08/13/2018   Procedure: PERIPHERAL VASCULAR INTERVENTION;  Surgeon: Waynetta Sandy, MD;  Location: [facility];  Service: Cardiovascular;  Laterality: Bilateral;  . RENAL ANGIOGRAM N/A 09/16/2011   Procedure: RENAL ANGIOGRAM;  Surgeon: Sherren Mocha,  MD;  Location: [facility];  Service: Cardiovascular;  Laterality: N/A;    reports that she quit smoking about 12 years ago. Her smoking use included cigarettes. She has never used smokeless tobacco. She reports that she does not drink alcohol and does not use drugs. family history includes Diabetes in her sister and son; Glaucoma in her father; Heart disease in her father. No Known Allergies Current Outpatient  Medications on File Prior to Visit  Medication Sig Dispense Refill  . acetaminophen (TYLENOL) 500 MG tablet Take 1-2 tablets (500-1,000 mg total) by mouth every 8 (eight) hours as needed for mild pain.    Marland Kitchen allopurinol (ZYLOPRIM) 100 MG tablet Take 100 mg by mouth daily.    Marland Kitchen amLODipine (NORVASC) 10 MG tablet TAKE 1 TABLET BY MOUTH EVERY DAY (Patient taking differently: Take 10 mg by mouth daily. ) 90 tablet 1  . aspirin 81 MG tablet Take 81 mg by mouth daily.    Marland Kitchen atorvastatin (LIPITOR) 80 MG tablet TAKE 1 TABLET BY MOUTH DAILY AT 6PM (Patient taking differently: Take 80 mg by mouth every evening. ) 90 tablet 3  . blood glucose meter kit and supplies KIT Dispense based on patient and insurance preference. Use up to three times daily as directed. (FOR ICD-9 250.00, 250.01). 1 each 0  . Blood Glucose Monitoring Suppl (ACCU-CHEK AVIVA) device Use as instructed once daily E11.9 1 each 0  . calcitRIOL (ROCALTROL) 0.5 MCG capsule Take 0.5 mcg by mouth daily.     . Cholecalciferol (VITAMIN D) 50 MCG (2000 UT) tablet Take 2,000 Units by mouth daily.    . clopidogrel (PLAVIX) 75 MG tablet TAKE 1 TABLET BY MOUTH EVERY DAY 90 tablet 1  . Cyanocobalamin (B-12 PO) Take 1 tablet by mouth daily.    . ferrous sulfate 325 (65 FE) MG tablet TAKE 1 TABLET BY MOUTH TWICE A DAY 180 tablet 1  . fluticasone (FLONASE) 50 MCG/ACT nasal spray Place 1 spray into both nostrils daily. 9.9 mL 0  . furosemide (LASIX) 80 MG tablet Take 0.5 tablets (40 mg total) by mouth 2 (two) times daily.    Marland Kitchen gabapentin (NEURONTIN) 100 MG capsule TAKE 1 CAPSULE BY MOUTH THREE TIMES A DAY (Patient taking differently: Take 100 mg by mouth 3 (three) times daily. ) 90 capsule 5  . glucose blood (ACCU-CHEK AVIVA PLUS) test strip USE AS INSTRUCTED THREE TIMES DAILY. DX E11.9 100 strip 5  . hydrOXYzine (ATARAX/VISTARIL) 10 MG tablet Take 1 tablet (10 mg total) by mouth 3 (three) times daily as needed for itching. 30 tablet 0  . insulin aspart  (NOVOLOG) 100 UNIT/ML injection Inject 8 Units into the skin 3 (three) times daily with meals. 10 mL 0  . insulin glargine (LANTUS) 100 UNIT/ML injection Inject 0.3 mLs (30 Units total) into the skin daily. 10 mL 0  . Insulin Pen Needle (BD PEN NEEDLE NANO U/F) 32G X 4 MM MISC USE DAILY WITH LANTUS SOLASTAR  E11.9 100 each 11  . ipratropium-albuterol (DUONEB) 0.5-2.5 (3) MG/3ML SOLN Take 3 mLs by nebulization every 4 (four) hours as needed (sob or wheezing). 360 mL 0  . Lancets MISC Use as directed three times daily with meals 100 each 3  . metoprolol tartrate (LOPRESSOR) 100 MG tablet TAKE 1 TABLET BY MOUTH TWICE A DAY (Patient taking differently: Take 100 mg by mouth 2 (two) times daily. ) 180 tablet 2  . MYRBETRIQ 25 MG TB24 tablet Take 25 mg by mouth daily.    Marland Kitchen  nitroGLYCERIN (NITROSTAT) 0.4 MG SL tablet Place 1 tablet (0.4 mg total) under the tongue every 5 (five) minutes as needed for chest pain. Reported on 04/06/2016 25 tablet 5  . omeprazole (PRILOSEC) 20 MG capsule TAKE 1 CAPSULE BY MOUTH EVERY DAY *APPT DUE IN FEB FOR REFILLS* (Patient taking differently: Take 20 mg by mouth daily. ) 90 capsule 0  . polyethylene glycol powder (GLYCOLAX/MIRALAX) powder Take 17 gm by mouth daily (Patient taking differently: Take 1 Container by mouth daily as needed for mild constipation. ) 3350 g 11  . risedronate (ACTONEL) 150 MG tablet TAKE 1 TABLET (150 MG TOTAL) BY MOUTH EVERY 30 (THIRTY) DAYS. FIRST TUESDAY (Patient taking differently: Take 150 mg by mouth every 30 (thirty) days. First Tuesday of every month) 3 tablet 3  . sodium bicarbonate 650 MG tablet Take 650 mg by mouth 2 (two) times daily.    . Tetrahydrozoline HCl (VISINE OP) Place 2 drops into both eyes daily as needed (DRY EYE).    Marland Kitchen timolol (TIMOPTIC) 0.5 % ophthalmic solution Place 1 drop into both eyes daily.     Marland Kitchen tiZANidine (ZANAFLEX) 4 MG tablet TAKE 1 TABLET BY MOUTH EVERY 6 HOURS AS NEEDED FOR MUSCLE SPASMS (Patient taking differently:  Take 4 mg by mouth every 6 (six) hours as needed for muscle spasms. ) 360 tablet 1  . traMADol (ULTRAM) 50 MG tablet Take 1 tablet (50 mg total) by mouth every 6 (six) hours as needed. (Patient taking differently: Take 50 mg by mouth every 6 (six) hours as needed for moderate pain. ) 120 tablet 2  . traMADol (ULTRAM) 50 MG tablet Take 1 tablet (50 mg total) by mouth every 6 (six) hours as needed for moderate pain. Prescription for home narcotic needed for SNF placement 10 tablet 0  . Turmeric (QC TUMERIC COMPLEX PO) Take 1 tablet by mouth daily.     Marland Kitchen zolpidem (AMBIEN) 5 MG tablet TAKE 1 TABLET BY MOUTH EVERY DAY AT BEDTIME AS NEEDED FOR SLEEP (Patient taking differently: Take 5 mg by mouth at bedtime as needed for sleep. ) 30 tablet 5   No current facility-administered medications on file prior to visit.   Observations/Objective: Alert, NAD, appropriate mood and affect, resps normal, cn 2-12 intact, moves all 4s, no visible rash or swelling Lab Results  Component Value Date   WBC 13.7 (H) 07/17/2020   HGB 9.6 (L) 07/17/2020   HCT 28.5 (L) 07/17/2020   PLT 473 (H) 07/17/2020   GLUCOSE 268 (H) 07/20/2020   CHOL 175 06/26/2020   TRIG 207 (H) 06/26/2020   HDL 40 (L) 06/26/2020   LDLDIRECT 101.0 06/20/2019   LDLCALC 103 (H) 06/26/2020   ALT 29 07/19/2020   AST 28 07/19/2020   NA 136 07/20/2020   K 4.7 07/20/2020   CL 104 07/20/2020   CREATININE 2.70 (H) 07/20/2020   BUN 95 (H) 07/20/2020   CO2 19 (L) 07/20/2020   TSH 3.57 06/26/2020   INR 1.2 07/09/2020   HGBA1C 7.8 (H) 06/26/2020   MICROALBUR 36.9 06/26/2020   Assessment and Plan: See notes  Follow Up Instructions: See notes  I discussed the assessment and treatment plan with the patient. The patient was provided an opportunity to ask questions and all were answered. The patient agreed with the plan and demonstrated an understanding of the instructions.   The patient was advised to call back or seek an in-person evaluation if  the symptoms worsen or if the condition fails to improve  as anticipated.   Cathlean Cower, MD

## 2020-08-12 ENCOUNTER — Telehealth: Payer: Self-pay | Admitting: Internal Medicine

## 2020-08-12 DIAGNOSIS — I120 Hypertensive chronic kidney disease with stage 5 chronic kidney disease or end stage renal disease: Secondary | ICD-10-CM | POA: Diagnosis not present

## 2020-08-12 DIAGNOSIS — L97822 Non-pressure chronic ulcer of other part of left lower leg with fat layer exposed: Secondary | ICD-10-CM | POA: Diagnosis not present

## 2020-08-12 DIAGNOSIS — R269 Unspecified abnormalities of gait and mobility: Secondary | ICD-10-CM

## 2020-08-12 DIAGNOSIS — L03116 Cellulitis of left lower limb: Secondary | ICD-10-CM | POA: Diagnosis not present

## 2020-08-12 DIAGNOSIS — I872 Venous insufficiency (chronic) (peripheral): Secondary | ICD-10-CM | POA: Diagnosis not present

## 2020-08-12 DIAGNOSIS — S81802A Unspecified open wound, left lower leg, initial encounter: Secondary | ICD-10-CM | POA: Diagnosis not present

## 2020-08-12 DIAGNOSIS — E1151 Type 2 diabetes mellitus with diabetic peripheral angiopathy without gangrene: Secondary | ICD-10-CM | POA: Diagnosis not present

## 2020-08-12 NOTE — Telephone Encounter (Signed)
Ok to reduce the lantus from 30 units to 26 units per day

## 2020-08-12 NOTE — Telephone Encounter (Signed)
Sent to Dr. John. 

## 2020-08-12 NOTE — Telephone Encounter (Signed)
That should be ok for now

## 2020-08-12 NOTE — Telephone Encounter (Signed)
[name] with [name] is requesting verbals for physical therapy 1w5.    Please call him  back at [phone]

## 2020-08-13 NOTE — Telephone Encounter (Signed)
Sent to Dr. John. 

## 2020-08-13 NOTE — Telephone Encounter (Signed)
Ok for verbals 

## 2020-08-14 DIAGNOSIS — I120 Hypertensive chronic kidney disease with stage 5 chronic kidney disease or end stage renal disease: Secondary | ICD-10-CM

## 2020-08-14 DIAGNOSIS — Z9181 History of falling: Secondary | ICD-10-CM

## 2020-08-14 DIAGNOSIS — E1122 Type 2 diabetes mellitus with diabetic chronic kidney disease: Secondary | ICD-10-CM

## 2020-08-14 DIAGNOSIS — N185 Chronic kidney disease, stage 5: Secondary | ICD-10-CM

## 2020-08-14 DIAGNOSIS — I700 Atherosclerosis of aorta: Secondary | ICD-10-CM

## 2020-08-14 DIAGNOSIS — E1151 Type 2 diabetes mellitus with diabetic peripheral angiopathy without gangrene: Secondary | ICD-10-CM | POA: Diagnosis not present

## 2020-08-14 DIAGNOSIS — Z7902 Long term (current) use of antithrombotics/antiplatelets: Secondary | ICD-10-CM

## 2020-08-14 DIAGNOSIS — I2510 Atherosclerotic heart disease of native coronary artery without angina pectoris: Secondary | ICD-10-CM

## 2020-08-14 DIAGNOSIS — G894 Chronic pain syndrome: Secondary | ICD-10-CM

## 2020-08-14 DIAGNOSIS — L97822 Non-pressure chronic ulcer of other part of left lower leg with fat layer exposed: Secondary | ICD-10-CM | POA: Diagnosis not present

## 2020-08-14 DIAGNOSIS — Z7982 Long term (current) use of aspirin: Secondary | ICD-10-CM

## 2020-08-14 DIAGNOSIS — J9690 Respiratory failure, unspecified, unspecified whether with hypoxia or hypercapnia: Secondary | ICD-10-CM

## 2020-08-14 DIAGNOSIS — M791 Myalgia, unspecified site: Secondary | ICD-10-CM

## 2020-08-14 DIAGNOSIS — Z6841 Body Mass Index (BMI) 40.0 and over, adult: Secondary | ICD-10-CM

## 2020-08-14 DIAGNOSIS — I051 Rheumatic mitral insufficiency: Secondary | ICD-10-CM

## 2020-08-14 DIAGNOSIS — E785 Hyperlipidemia, unspecified: Secondary | ICD-10-CM

## 2020-08-14 DIAGNOSIS — J9811 Atelectasis: Secondary | ICD-10-CM

## 2020-08-14 DIAGNOSIS — L03116 Cellulitis of left lower limb: Secondary | ICD-10-CM | POA: Diagnosis not present

## 2020-08-14 DIAGNOSIS — I872 Venous insufficiency (chronic) (peripheral): Secondary | ICD-10-CM | POA: Diagnosis not present

## 2020-08-14 DIAGNOSIS — Z794 Long term (current) use of insulin: Secondary | ICD-10-CM

## 2020-08-14 DIAGNOSIS — Z79899 Other long term (current) drug therapy: Secondary | ICD-10-CM

## 2020-08-14 NOTE — Telephone Encounter (Signed)
    Milta Deiters from [facility] requesting order for bedside commode and rolling wheel walker.  No preference of DME company  [employer], [phone]

## 2020-08-16 ENCOUNTER — Encounter: Payer: Self-pay | Admitting: Internal Medicine

## 2020-08-16 DIAGNOSIS — J449 Chronic obstructive pulmonary disease, unspecified: Secondary | ICD-10-CM | POA: Insufficient documentation

## 2020-08-16 NOTE — Assessment & Plan Note (Signed)
stable overall by history and exam, recent data reviewed with pt, and pt to continue medical treatment as before,  to f/u any worsening symptoms or concerns  

## 2020-08-16 NOTE — Assessment & Plan Note (Addendum)
Now at baseline function, for pulm referral as recommended  I spent 31 minutes in preparing to see the patient by review of recent labs, imaging and procedures, obtaining and reviewing separately obtained history, communicating with the patient and family or caregiver, ordering medications, tests or procedures, and documenting clinical information in the EHR including the differential Dx, treatment, and any further evaluation and other management of dyspnea, venous stasis ulcers, dm,htn

## 2020-08-16 NOTE — Assessment & Plan Note (Signed)
With recent leg cellulitis now resolved, cont f/u with vascular surgury

## 2020-08-16 NOTE — Patient Instructions (Signed)
Please continue all other medications as before, and refills have been done if requested.  Please have the pharmacy call with any other refills you may need.  Please continue your efforts at being more active, low cholesterol diet, and weight control.  Please keep your appointments with your specialists as you may have planned  You will be contacted regarding the referral for: pulmonary

## 2020-08-17 DIAGNOSIS — S81802A Unspecified open wound, left lower leg, initial encounter: Secondary | ICD-10-CM | POA: Diagnosis not present

## 2020-08-17 NOTE — Telephone Encounter (Signed)
Sent to Dr. John. 

## 2020-08-18 NOTE — Telephone Encounter (Signed)
Done hardcopy to maria 

## 2020-08-18 NOTE — Addendum Note (Signed)
Addended by: [name] on: 08/18/2020 06:13 PM   Modules accepted: Orders

## 2020-08-19 DIAGNOSIS — E1151 Type 2 diabetes mellitus with diabetic peripheral angiopathy without gangrene: Secondary | ICD-10-CM | POA: Diagnosis not present

## 2020-08-19 DIAGNOSIS — L97822 Non-pressure chronic ulcer of other part of left lower leg with fat layer exposed: Secondary | ICD-10-CM | POA: Diagnosis not present

## 2020-08-19 DIAGNOSIS — I120 Hypertensive chronic kidney disease with stage 5 chronic kidney disease or end stage renal disease: Secondary | ICD-10-CM | POA: Diagnosis not present

## 2020-08-19 DIAGNOSIS — L03116 Cellulitis of left lower limb: Secondary | ICD-10-CM | POA: Diagnosis not present

## 2020-08-19 DIAGNOSIS — I872 Venous insufficiency (chronic) (peripheral): Secondary | ICD-10-CM | POA: Diagnosis not present

## 2020-08-20 DIAGNOSIS — E1151 Type 2 diabetes mellitus with diabetic peripheral angiopathy without gangrene: Secondary | ICD-10-CM | POA: Diagnosis not present

## 2020-08-20 DIAGNOSIS — I120 Hypertensive chronic kidney disease with stage 5 chronic kidney disease or end stage renal disease: Secondary | ICD-10-CM | POA: Diagnosis not present

## 2020-08-20 DIAGNOSIS — L97822 Non-pressure chronic ulcer of other part of left lower leg with fat layer exposed: Secondary | ICD-10-CM | POA: Diagnosis not present

## 2020-08-20 DIAGNOSIS — L03116 Cellulitis of left lower limb: Secondary | ICD-10-CM | POA: Diagnosis not present

## 2020-08-20 DIAGNOSIS — I872 Venous insufficiency (chronic) (peripheral): Secondary | ICD-10-CM | POA: Diagnosis not present

## 2020-08-21 DIAGNOSIS — L03116 Cellulitis of left lower limb: Secondary | ICD-10-CM | POA: Diagnosis not present

## 2020-08-21 DIAGNOSIS — E1151 Type 2 diabetes mellitus with diabetic peripheral angiopathy without gangrene: Secondary | ICD-10-CM | POA: Diagnosis not present

## 2020-08-21 DIAGNOSIS — L97822 Non-pressure chronic ulcer of other part of left lower leg with fat layer exposed: Secondary | ICD-10-CM | POA: Diagnosis not present

## 2020-08-21 DIAGNOSIS — I120 Hypertensive chronic kidney disease with stage 5 chronic kidney disease or end stage renal disease: Secondary | ICD-10-CM | POA: Diagnosis not present

## 2020-08-21 DIAGNOSIS — I872 Venous insufficiency (chronic) (peripheral): Secondary | ICD-10-CM | POA: Diagnosis not present

## 2020-08-24 DIAGNOSIS — R269 Unspecified abnormalities of gait and mobility: Secondary | ICD-10-CM | POA: Diagnosis not present

## 2020-08-25 DIAGNOSIS — L03116 Cellulitis of left lower limb: Secondary | ICD-10-CM | POA: Diagnosis not present

## 2020-08-25 DIAGNOSIS — E1151 Type 2 diabetes mellitus with diabetic peripheral angiopathy without gangrene: Secondary | ICD-10-CM | POA: Diagnosis not present

## 2020-08-25 DIAGNOSIS — L97822 Non-pressure chronic ulcer of other part of left lower leg with fat layer exposed: Secondary | ICD-10-CM | POA: Diagnosis not present

## 2020-08-25 DIAGNOSIS — I120 Hypertensive chronic kidney disease with stage 5 chronic kidney disease or end stage renal disease: Secondary | ICD-10-CM | POA: Diagnosis not present

## 2020-08-25 DIAGNOSIS — I872 Venous insufficiency (chronic) (peripheral): Secondary | ICD-10-CM | POA: Diagnosis not present

## 2020-08-26 DIAGNOSIS — L97822 Non-pressure chronic ulcer of other part of left lower leg with fat layer exposed: Secondary | ICD-10-CM | POA: Diagnosis not present

## 2020-08-26 DIAGNOSIS — I872 Venous insufficiency (chronic) (peripheral): Secondary | ICD-10-CM | POA: Diagnosis not present

## 2020-08-26 DIAGNOSIS — I120 Hypertensive chronic kidney disease with stage 5 chronic kidney disease or end stage renal disease: Secondary | ICD-10-CM | POA: Diagnosis not present

## 2020-08-26 DIAGNOSIS — E1151 Type 2 diabetes mellitus with diabetic peripheral angiopathy without gangrene: Secondary | ICD-10-CM | POA: Diagnosis not present

## 2020-08-26 DIAGNOSIS — L03116 Cellulitis of left lower limb: Secondary | ICD-10-CM | POA: Diagnosis not present

## 2020-08-27 ENCOUNTER — Telehealth: Payer: Self-pay | Admitting: Internal Medicine

## 2020-08-27 NOTE — Telephone Encounter (Signed)
Patients daughter calling because three weeks ago we received forms to finish so the patient could go into a home because her daughter is having brain surgery and there will be no one to be able to take care of the patient. Patients daughter is upset because the social worker has not received these forms and would like to speak with someone. [name]

## 2020-08-27 NOTE — Telephone Encounter (Signed)
Spoke with pts daughter and was ale to get the number for the pts Education officer, [employer].  **I spoke with  Jackelyn Poling the Education officer, [employer] and was able to give her the correct fax and telephone number as she had the wrong fax number and telephone number to the clinic.

## 2020-08-28 ENCOUNTER — Encounter (HOSPITAL_COMMUNITY): Payer: Medicare Other

## 2020-08-28 DIAGNOSIS — I872 Venous insufficiency (chronic) (peripheral): Secondary | ICD-10-CM | POA: Diagnosis not present

## 2020-08-28 DIAGNOSIS — L03116 Cellulitis of left lower limb: Secondary | ICD-10-CM | POA: Diagnosis not present

## 2020-08-28 DIAGNOSIS — L97822 Non-pressure chronic ulcer of other part of left lower leg with fat layer exposed: Secondary | ICD-10-CM | POA: Diagnosis not present

## 2020-08-28 DIAGNOSIS — E1151 Type 2 diabetes mellitus with diabetic peripheral angiopathy without gangrene: Secondary | ICD-10-CM | POA: Diagnosis not present

## 2020-08-28 DIAGNOSIS — I120 Hypertensive chronic kidney disease with stage 5 chronic kidney disease or end stage renal disease: Secondary | ICD-10-CM | POA: Diagnosis not present

## 2020-09-02 ENCOUNTER — Telehealth: Payer: Self-pay | Admitting: Internal Medicine

## 2020-09-02 DIAGNOSIS — E1151 Type 2 diabetes mellitus with diabetic peripheral angiopathy without gangrene: Secondary | ICD-10-CM | POA: Diagnosis not present

## 2020-09-02 DIAGNOSIS — I872 Venous insufficiency (chronic) (peripheral): Secondary | ICD-10-CM | POA: Diagnosis not present

## 2020-09-02 DIAGNOSIS — I120 Hypertensive chronic kidney disease with stage 5 chronic kidney disease or end stage renal disease: Secondary | ICD-10-CM | POA: Diagnosis not present

## 2020-09-02 DIAGNOSIS — L03116 Cellulitis of left lower limb: Secondary | ICD-10-CM | POA: Diagnosis not present

## 2020-09-02 DIAGNOSIS — L97822 Non-pressure chronic ulcer of other part of left lower leg with fat layer exposed: Secondary | ICD-10-CM | POA: Diagnosis not present

## 2020-09-02 NOTE — Telephone Encounter (Signed)
    [facility] requesting verbal order to extend home PT 1X week for 4 weeks  Phone [phone]

## 2020-09-04 DIAGNOSIS — I872 Venous insufficiency (chronic) (peripheral): Secondary | ICD-10-CM | POA: Diagnosis not present

## 2020-09-04 DIAGNOSIS — I120 Hypertensive chronic kidney disease with stage 5 chronic kidney disease or end stage renal disease: Secondary | ICD-10-CM | POA: Diagnosis not present

## 2020-09-04 DIAGNOSIS — L97822 Non-pressure chronic ulcer of other part of left lower leg with fat layer exposed: Secondary | ICD-10-CM | POA: Diagnosis not present

## 2020-09-04 DIAGNOSIS — L03116 Cellulitis of left lower limb: Secondary | ICD-10-CM | POA: Diagnosis not present

## 2020-09-04 DIAGNOSIS — E1151 Type 2 diabetes mellitus with diabetic peripheral angiopathy without gangrene: Secondary | ICD-10-CM | POA: Diagnosis not present

## 2020-09-04 NOTE — Telephone Encounter (Signed)
Jackelyn Poling is calling to follow up, Requesting a call back @ [phone]. I have not received anything have you? Asked Debbie to fax again.

## 2020-09-07 DIAGNOSIS — I120 Hypertensive chronic kidney disease with stage 5 chronic kidney disease or end stage renal disease: Secondary | ICD-10-CM | POA: Diagnosis not present

## 2020-09-07 DIAGNOSIS — L03116 Cellulitis of left lower limb: Secondary | ICD-10-CM | POA: Diagnosis not present

## 2020-09-07 DIAGNOSIS — E1151 Type 2 diabetes mellitus with diabetic peripheral angiopathy without gangrene: Secondary | ICD-10-CM | POA: Diagnosis not present

## 2020-09-07 DIAGNOSIS — I872 Venous insufficiency (chronic) (peripheral): Secondary | ICD-10-CM | POA: Diagnosis not present

## 2020-09-07 DIAGNOSIS — L97822 Non-pressure chronic ulcer of other part of left lower leg with fat layer exposed: Secondary | ICD-10-CM | POA: Diagnosis not present

## 2020-09-07 NOTE — Telephone Encounter (Signed)
Forms have been completed and Placed in providers box to review and sign.

## 2020-09-07 NOTE — Telephone Encounter (Signed)
Ok for verbal 

## 2020-09-07 NOTE — Telephone Encounter (Signed)
I have received FL2 via fax.

## 2020-09-07 NOTE — Telephone Encounter (Signed)
Sent to Dr. John. 

## 2020-09-08 NOTE — Telephone Encounter (Signed)
Forms have been signed, Faxed, Copy sent to scan.

## 2020-09-08 NOTE — Telephone Encounter (Signed)
Was able to give the verbal order to extend home PT 1X week for 4 weeks

## 2020-09-10 ENCOUNTER — Other Ambulatory Visit: Payer: Self-pay

## 2020-09-10 ENCOUNTER — Encounter: Payer: Self-pay | Admitting: Pulmonary Disease

## 2020-09-10 ENCOUNTER — Ambulatory Visit: Payer: Medicare Other | Admitting: Pulmonary Disease

## 2020-09-10 VITALS — BP 126/62 | HR 77 | Temp 98.3°F | Ht 61.0 in | Wt 214.6 lb

## 2020-09-10 DIAGNOSIS — R0609 Other forms of dyspnea: Secondary | ICD-10-CM

## 2020-09-10 DIAGNOSIS — Z23 Encounter for immunization: Secondary | ICD-10-CM | POA: Diagnosis not present

## 2020-09-10 DIAGNOSIS — R0600 Dyspnea, unspecified: Secondary | ICD-10-CM

## 2020-09-10 NOTE — Progress Notes (Signed)
Patient ID: [name], female    DOB: 1946/04/17, 74 y.o.   MRN: [id number]  Chief Complaint  Patient presents with  . Consult    referred for DOE, has been told possible COPD    Referring provider: [name], MD  HPI:   74 year old woman number seen in consultation at the request of Cathlean Cower, MD for evaluation of dyspnea exertion.  Notes from referring provider and extensive notes from recent hospitalization reviewed.  Notably, patient was hospitalized for 12 days in September 2021 with severe sepsis due to left lower extremity cellulitis with streptococcal bacteremia.  She was on IV antibiotics for 7 days.  During admission she was noted to be hypoxemic.  TTE was relatively reassuring, VQ scan negative for PE.  She was reporting wheezing and treated with steroids and nebulizer therapy.  She was discharged on oxygen to skilled nursing facility.  Oxygen was subsequently weaned off and the nursing facility.  Prior to admission and skilled nursing facility, she denies any dyspnea on exertion.  No respiratory issues whatsoever.  However, since returning home, she reports new dyspnea on exertion.  Worse with inclines or stairs.  Also present on flat surfaces of relatively short distances such as walking to the bathroom.  No other aggravating factors.  Dyspnea relieved with rest.  Has not really tried any medications to help relieve or treat this.  She has no orthopnea.  She does states she has a hard time sleeping.  On occasion she will wake up and she will notice she is very short of breath and sit on the side of bed and rest for a while.  Review of chest x-rays while admitted 9/16, 9/18, 9/21 2021 on my interpretation revealed progressive lower lung field haziness, interstitial infiltrates in the setting of sepsis and fluid resuscitation most likely represents mild pulmonary edema, no clear infiltrate or mass.  Echocardiogram from admission 07/10/2020 also reviewed with normal LV  function, no significant valvular abnormalities, good RV function.  PMH: Hypertension, hyperlipidemia, diabetes Surgical history: Hysterectomy Family history: CAD in father, diabetes in sister and son Social history: Former smoker, quit in 2009, 50+ pack year smoking history   Questionaires / Pulmonary Flowsheets:   ACT:  No flowsheet data found.  MMRC: No flowsheet data found.  Epworth:  No flowsheet data found.  Tests:   FENO:  No results found for: NITRICOXIDE  PFT: No flowsheet data found.  WALK:  No flowsheet data found.  Imaging: Personally reviewed and as per EMR and discussion of this note  Lab Results: Personally reviewed and as per EMR, notably eosinophils elevated to 800 in the past, most recently 500 CBC    Component Value Date/Time   WBC 13.7 (H) 07/17/2020 0453   RBC 3.47 (L) 07/17/2020 0453   HGB 9.6 (L) 07/17/2020 0453   HCT 28.5 (L) 07/17/2020 0453   PLT 473 (H) 07/17/2020 0453   MCV 82.1 07/17/2020 0453   MCH 27.7 07/17/2020 0453   MCHC 33.7 07/17/2020 0453   RDW 14.5 07/17/2020 0453   LYMPHSABS 2.3 07/15/2020 1006   MONOABS 0.6 07/15/2020 1006   EOSABS 0.5 07/15/2020 1006   BASOSABS 0.2 (H) 07/15/2020 1006    BMET    Component Value Date/Time   NA 136 07/20/2020 1430   K 4.7 07/20/2020 1430   CL 104 07/20/2020 1430   CO2 19 (L) 07/20/2020 1430   GLUCOSE 268 (H) 07/20/2020 1430   BUN 95 (H) 07/20/2020 1430  CREATININE 2.70 (H) 07/20/2020 1430   CREATININE 2.62 (H) 06/26/2020 1621   CALCIUM 9.2 07/20/2020 1430   CALCIUM 10.4 (H) 01/25/2019 1355   GFRNONAA 16 (L) 07/20/2020 1430   GFRNONAA 17 (L) 06/26/2020 1621   GFRAA 19 (L) 07/20/2020 1430   GFRAA 20 (L) 06/26/2020 1621    BNP No results found for: BNP  ProBNP    Component Value Date/Time   PROBNP 99.0 03/05/2014 1640    Specialty Problems      Pulmonary Problems   Dyspnea   COPD (chronic obstructive pulmonary disease) (HCC)      No Known  Allergies  Immunization History  Administered Date(s) Administered  . Fluad Quad(high Dose 65+) 06/20/2019  . H1N1 12/18/2008  . Influenza Whole 09/30/2009, 10/07/2010  . Influenza, High Dose Seasonal PF 07/06/2017, 08/02/2018  . Influenza, Seasonal, Injecte, Preservative Fre 08/02/2013  . Influenza-Unspecified 07/25/2015, 08/25/2015  . PFIZER SARS-COV-2 Vaccination 12/15/2019, 01/08/2020  . Pneumococcal Conjugate-13 09/13/2013  . Pneumococcal Polysaccharide-23 06/05/2006, 05/11/2011  . Td 12/18/2008  . Tdap 12/20/2018    Past Medical History:  Diagnosis Date  . Anemia, iron deficiency   . Aortic atherosclerosis ([place]) 11/17/2017  . Arthritis   . CAD (coronary artery disease)   . Carotid stenosis    Carotid US (02/2014):  Bilateral ICA 40-59%; > 50% L ECA; F/u 1 year  . Cataract    removed both eyes  . Cholelithiasis 11/17/2017  . Chronic kidney disease    chronic renal failure  . DM2 (diabetes mellitus, type 2) ([place])   . GERD (gastroesophageal reflux disease)   . Glaucoma   . HLD (hyperlipidemia)   . HTN (hypertension)   . Hx of cardiovascular stress test    Lexiscan Myoview (03/24/14):  No ischemia, EF 73%, Normal  . Hypoglycemia 06/27/2017  . Mitral regurgitation   . Osteoporosis   . PUD (peptic ulcer disease)   . PVD (peripheral vascular disease) ([place])   . Shortness of breath    with exertion    Tobacco History: Social History   Tobacco Use  Smoking Status Former Smoker  . Types: Cigarettes  . Quit date: 10/25/2007  . Years since quitting: 12.8  Smokeless Tobacco Never Used   Counseling given: Not Answered   Continue to not smoke  Outpatient Encounter Medications as of 09/10/2020  Medication Sig  . acetaminophen (TYLENOL) 500 MG tablet Take 1-2 tablets (500-1,000 mg total) by mouth every 8 (eight) hours as needed for mild pain.  Marland Kitchen allopurinol (ZYLOPRIM) 100 MG tablet Take 100 mg by mouth daily.  Marland Kitchen amLODipine (NORVASC) 10 MG tablet TAKE 1 TABLET BY MOUTH  EVERY DAY (Patient taking differently: Take 10 mg by mouth daily. )  . aspirin 81 MG tablet Take 81 mg by mouth daily.  Marland Kitchen atorvastatin (LIPITOR) 80 MG tablet TAKE 1 TABLET BY MOUTH DAILY AT 6PM (Patient taking differently: Take 80 mg by mouth every evening. )  . blood glucose meter kit and supplies KIT Dispense based on patient and insurance preference. Use up to three times daily as directed. (FOR ICD-9 250.00, 250.01).  . Blood Glucose Monitoring Suppl (ACCU-CHEK AVIVA) device Use as instructed once daily E11.9  . calcitRIOL (ROCALTROL) 0.5 MCG capsule Take 0.5 mcg by mouth daily.   . Cholecalciferol (VITAMIN D) 50 MCG (2000 UT) tablet Take 2,000 Units by mouth daily.  . clopidogrel (PLAVIX) 75 MG tablet TAKE 1 TABLET BY MOUTH EVERY DAY  . Cyanocobalamin (B-12 PO) Take 1 tablet by mouth daily.  Marland Kitchen  ferrous sulfate 325 (65 FE) MG tablet TAKE 1 TABLET BY MOUTH TWICE A DAY  . fluticasone (FLONASE) 50 MCG/ACT nasal spray Place 1 spray into both nostrils daily.  . furosemide (LASIX) 80 MG tablet Take 0.5 tablets (40 mg total) by mouth 2 (two) times daily.  Marland Kitchen gabapentin (NEURONTIN) 100 MG capsule TAKE 1 CAPSULE BY MOUTH THREE TIMES A DAY (Patient taking differently: Take 100 mg by mouth 3 (three) times daily. )  . glucose blood (ACCU-CHEK AVIVA PLUS) test strip USE AS INSTRUCTED THREE TIMES DAILY. DX E11.9  . hydrOXYzine (ATARAX/VISTARIL) 10 MG tablet Take 1 tablet (10 mg total) by mouth 3 (three) times daily as needed for itching.  . insulin glargine (LANTUS) 100 UNIT/ML injection Inject 0.3 mLs (30 Units total) into the skin daily.  . Insulin Pen Needle (BD PEN NEEDLE NANO U/F) 32G X 4 MM MISC USE DAILY WITH LANTUS SOLASTAR  E11.9  . ipratropium-albuterol (DUONEB) 0.5-2.5 (3) MG/3ML SOLN Take 3 mLs by nebulization every 4 (four) hours as needed (sob or wheezing).  . Lancets MISC Use as directed three times daily with meals  . metoprolol tartrate (LOPRESSOR) 100 MG tablet TAKE 1 TABLET BY MOUTH TWICE A  DAY (Patient taking differently: Take 100 mg by mouth 2 (two) times daily. )  . MYRBETRIQ 25 MG TB24 tablet Take 25 mg by mouth daily.  . nitroGLYCERIN (NITROSTAT) 0.4 MG SL tablet Place 1 tablet (0.4 mg total) under the tongue every 5 (five) minutes as needed for chest pain. Reported on 04/06/2016  . omeprazole (PRILOSEC) 20 MG capsule TAKE 1 CAPSULE BY MOUTH EVERY DAY *APPT DUE IN FEB FOR REFILLS* (Patient taking differently: Take 20 mg by mouth daily. )  . polyethylene glycol powder (GLYCOLAX/MIRALAX) powder Take 17 gm by mouth daily (Patient taking differently: Take 1 Container by mouth daily as needed for mild constipation. )  . risedronate (ACTONEL) 150 MG tablet TAKE 1 TABLET (150 MG TOTAL) BY MOUTH EVERY 30 (THIRTY) DAYS. FIRST TUESDAY (Patient taking differently: Take 150 mg by mouth every 30 (thirty) days. First Tuesday of every month)  . sodium bicarbonate 650 MG tablet Take 650 mg by mouth 2 (two) times daily.  . Tetrahydrozoline HCl (VISINE OP) Place 2 drops into both eyes daily as needed (DRY EYE).  Marland Kitchen timolol (TIMOPTIC) 0.5 % ophthalmic solution Place 1 drop into both eyes daily.   Marland Kitchen tiZANidine (ZANAFLEX) 4 MG tablet TAKE 1 TABLET BY MOUTH EVERY 6 HOURS AS NEEDED FOR MUSCLE SPASMS (Patient taking differently: Take 4 mg by mouth every 6 (six) hours as needed for muscle spasms. )  . traMADol (ULTRAM) 50 MG tablet Take 1 tablet (50 mg total) by mouth every 6 (six) hours as needed. (Patient taking differently: Take 50 mg by mouth every 6 (six) hours as needed for moderate pain. )  . Turmeric (QC TUMERIC COMPLEX PO) Take 1 tablet by mouth daily.   Marland Kitchen zolpidem (AMBIEN) 5 MG tablet TAKE 1 TABLET BY MOUTH EVERY DAY AT BEDTIME AS NEEDED FOR SLEEP (Patient taking differently: Take 5 mg by mouth at bedtime as needed for sleep. )  . [DISCONTINUED] insulin aspart (NOVOLOG) 100 UNIT/ML injection Inject 8 Units into the skin 3 (three) times daily with meals.  . [DISCONTINUED] traMADol (ULTRAM) 50 MG  tablet Take 1 tablet (50 mg total) by mouth every 6 (six) hours as needed for moderate pain. Prescription for home narcotic needed for SNF placement   No facility-administered encounter medications on file as of  09/10/2020.     Review of Systems  Review of Systems  No chest pain.  No orthopnea.  No cough.  Comprehensive review of systems otherwise negative.  Physical Exam  BP 126/62 (BP Location: Left Arm, Cuff Size: Large)   Pulse 77   Temp 98.3 F (36.8 C) (Skin)   Ht $R'5\' 1"'ZT$  (1.549 m)   Wt 214 lb 9.6 oz (97.3 kg)   SpO2 94%   BMI 40.55 kg/m   Wt Readings from Last 5 Encounters:  09/10/20 214 lb 9.6 oz (97.3 kg)  07/17/20 231 lb 0.7 oz (104.8 kg)  06/26/20 231 lb (104.8 kg)  01/09/20 223 lb 1.9 oz (101.2 kg)  12/30/19 223 lb (101.2 kg)    BMI Readings from Last 5 Encounters:  09/10/20 40.55 kg/m  07/17/20 43.65 kg/m  06/26/20 43.65 kg/m  01/09/20 42.16 kg/m  12/30/19 42.14 kg/m     Physical Exam General: Sitting in wheelchair, no acute distress Eyes: EOMI, no icterus Neck: Supple, no JVP appreciated Respiratory: Clear aspiration bilaterally, no wheezing Cardiovascular: Regular rhythm, no murmurs Abdomen: Nondistended, bowel sounds present MSK: No synovitis, joint effusion Neuro: No focal weakness, sensation intact Psych: Normal mood, full affect   Assessment & Plan:   Dyspnea on exertion: Onset of symptoms after nearly 2 weeks hospitalization and rehabilitation stay raises greatest concern for deconditioning.  She denies any issues with breathing prior to hospitalization.  She has significant, 50+ pack year, smoking history which raises possibility of COPD.  Given timing of symptoms, do not think this is very relative to the overall issue.  If COPD present, would advocate for treating with bronchodilator therapy and effort to improve breathing and continue rehab and improving her stamina.  PFTs ordered to be obtained in the coming days.  Likely sleep  disordered breathing: Describes symptoms of shortness of breath in the night for which she sits on side side of bed rest for couple minutes.  This resolved and she goes back to sleep.  Concern for OSA.  Discussed rationale for testing and treating.  She is precontemplative in terms of actually adhering to therapeutics as recommended.  She is to think about this and we will discuss at next visit.  Healthcare maintenance: Flu shot provided today.   Return in about 3 months (around 12/11/2020).   Lanier Clam, MD 09/10/2020

## 2020-09-10 NOTE — Patient Instructions (Addendum)
Nice to meet you!  I think that given your shortness of breath is worsened after being in the hospital and rehab makes it likely that you just need more time to strengthen and recover your stamina.  I have ordered breathing (pulmonary function test) to be scheduled to evaluate other causes that could be contributing to your shortness of breath.  If something like COPD is present, we can prescribe an inhaler to help with this.  Flu shot today.  Return to clinic in 3 months for follow-up with Dr. Silas Flood.

## 2020-09-16 DIAGNOSIS — E1151 Type 2 diabetes mellitus with diabetic peripheral angiopathy without gangrene: Secondary | ICD-10-CM | POA: Diagnosis not present

## 2020-09-16 DIAGNOSIS — L03116 Cellulitis of left lower limb: Secondary | ICD-10-CM | POA: Diagnosis not present

## 2020-09-16 DIAGNOSIS — I120 Hypertensive chronic kidney disease with stage 5 chronic kidney disease or end stage renal disease: Secondary | ICD-10-CM | POA: Diagnosis not present

## 2020-09-16 DIAGNOSIS — I872 Venous insufficiency (chronic) (peripheral): Secondary | ICD-10-CM | POA: Diagnosis not present

## 2020-09-16 DIAGNOSIS — L97822 Non-pressure chronic ulcer of other part of left lower leg with fat layer exposed: Secondary | ICD-10-CM | POA: Diagnosis not present

## 2020-09-18 DIAGNOSIS — I120 Hypertensive chronic kidney disease with stage 5 chronic kidney disease or end stage renal disease: Secondary | ICD-10-CM | POA: Diagnosis not present

## 2020-09-18 DIAGNOSIS — L03116 Cellulitis of left lower limb: Secondary | ICD-10-CM | POA: Diagnosis not present

## 2020-09-18 DIAGNOSIS — I872 Venous insufficiency (chronic) (peripheral): Secondary | ICD-10-CM | POA: Diagnosis not present

## 2020-09-18 DIAGNOSIS — E1151 Type 2 diabetes mellitus with diabetic peripheral angiopathy without gangrene: Secondary | ICD-10-CM | POA: Diagnosis not present

## 2020-09-18 DIAGNOSIS — L97822 Non-pressure chronic ulcer of other part of left lower leg with fat layer exposed: Secondary | ICD-10-CM | POA: Diagnosis not present

## 2020-09-21 ENCOUNTER — Other Ambulatory Visit: Payer: Self-pay | Admitting: Internal Medicine

## 2020-09-23 DIAGNOSIS — I120 Hypertensive chronic kidney disease with stage 5 chronic kidney disease or end stage renal disease: Secondary | ICD-10-CM | POA: Diagnosis not present

## 2020-09-23 DIAGNOSIS — L03116 Cellulitis of left lower limb: Secondary | ICD-10-CM | POA: Diagnosis not present

## 2020-09-23 DIAGNOSIS — E1151 Type 2 diabetes mellitus with diabetic peripheral angiopathy without gangrene: Secondary | ICD-10-CM | POA: Diagnosis not present

## 2020-09-23 DIAGNOSIS — L97822 Non-pressure chronic ulcer of other part of left lower leg with fat layer exposed: Secondary | ICD-10-CM | POA: Diagnosis not present

## 2020-09-23 DIAGNOSIS — I872 Venous insufficiency (chronic) (peripheral): Secondary | ICD-10-CM | POA: Diagnosis not present

## 2020-09-25 ENCOUNTER — Other Ambulatory Visit: Payer: Self-pay | Admitting: Internal Medicine

## 2020-09-25 NOTE — Telephone Encounter (Signed)
   [name] from [facility] calling to report missed visit today

## 2020-09-25 NOTE — Telephone Encounter (Signed)
Please refill as per office routine med refill policy (all routine meds refilled for 3 mo or monthly per pt preference up to one year from last visit, then month to month grace period for 3 mo, then further med refills will have to be denied)  

## 2020-09-28 NOTE — Telephone Encounter (Signed)
   Debbie from [facility] calling to report patient was never placed,updated FL2 needed. Seeking long tern placement  Phone [phone]

## 2020-09-29 DIAGNOSIS — I872 Venous insufficiency (chronic) (peripheral): Secondary | ICD-10-CM | POA: Diagnosis not present

## 2020-09-29 DIAGNOSIS — I120 Hypertensive chronic kidney disease with stage 5 chronic kidney disease or end stage renal disease: Secondary | ICD-10-CM | POA: Diagnosis not present

## 2020-09-29 DIAGNOSIS — E1151 Type 2 diabetes mellitus with diabetic peripheral angiopathy without gangrene: Secondary | ICD-10-CM | POA: Diagnosis not present

## 2020-09-29 DIAGNOSIS — L97822 Non-pressure chronic ulcer of other part of left lower leg with fat layer exposed: Secondary | ICD-10-CM | POA: Diagnosis not present

## 2020-09-29 DIAGNOSIS — L03116 Cellulitis of left lower limb: Secondary | ICD-10-CM | POA: Diagnosis not present

## 2020-09-30 NOTE — Telephone Encounter (Signed)
Spoke with Debbie. Faxed over FL2 again to [phone].

## 2020-10-06 DIAGNOSIS — I499 Cardiac arrhythmia, unspecified: Secondary | ICD-10-CM | POA: Diagnosis not present

## 2020-10-06 DIAGNOSIS — E7800 Pure hypercholesterolemia, unspecified: Secondary | ICD-10-CM | POA: Diagnosis not present

## 2020-10-06 DIAGNOSIS — H409 Unspecified glaucoma: Secondary | ICD-10-CM | POA: Diagnosis not present

## 2020-10-06 DIAGNOSIS — Z95820 Peripheral vascular angioplasty status with implants and grafts: Secondary | ICD-10-CM | POA: Diagnosis not present

## 2020-10-06 DIAGNOSIS — I110 Hypertensive heart disease with heart failure: Secondary | ICD-10-CM | POA: Diagnosis not present

## 2020-10-06 DIAGNOSIS — E876 Hypokalemia: Secondary | ICD-10-CM | POA: Diagnosis not present

## 2020-10-06 DIAGNOSIS — D649 Anemia, unspecified: Secondary | ICD-10-CM | POA: Diagnosis not present

## 2020-10-06 DIAGNOSIS — Z743 Need for continuous supervision: Secondary | ICD-10-CM | POA: Diagnosis not present

## 2020-10-06 DIAGNOSIS — I501 Left ventricular failure: Secondary | ICD-10-CM | POA: Diagnosis not present

## 2020-10-06 DIAGNOSIS — Z66 Do not resuscitate: Secondary | ICD-10-CM | POA: Diagnosis not present

## 2020-10-06 DIAGNOSIS — Z79899 Other long term (current) drug therapy: Secondary | ICD-10-CM | POA: Diagnosis not present

## 2020-10-06 DIAGNOSIS — R791 Abnormal coagulation profile: Secondary | ICD-10-CM | POA: Diagnosis not present

## 2020-10-06 DIAGNOSIS — J9601 Acute respiratory failure with hypoxia: Secondary | ICD-10-CM | POA: Diagnosis not present

## 2020-10-06 DIAGNOSIS — Z7189 Other specified counseling: Secondary | ICD-10-CM | POA: Diagnosis not present

## 2020-10-06 DIAGNOSIS — Z515 Encounter for palliative care: Secondary | ICD-10-CM | POA: Diagnosis not present

## 2020-10-06 DIAGNOSIS — R000 Tachycardia, unspecified: Secondary | ICD-10-CM | POA: Diagnosis not present

## 2020-10-06 DIAGNOSIS — J1282 Pneumonia due to coronavirus disease 2019: Secondary | ICD-10-CM | POA: Diagnosis not present

## 2020-10-06 DIAGNOSIS — U071 COVID-19: Secondary | ICD-10-CM | POA: Diagnosis not present

## 2020-10-06 DIAGNOSIS — I361 Nonrheumatic tricuspid (valve) insufficiency: Secondary | ICD-10-CM | POA: Diagnosis not present

## 2020-10-06 DIAGNOSIS — I2510 Atherosclerotic heart disease of native coronary artery without angina pectoris: Secondary | ICD-10-CM | POA: Diagnosis not present

## 2020-10-06 DIAGNOSIS — I255 Ischemic cardiomyopathy: Secondary | ICD-10-CM | POA: Diagnosis not present

## 2020-10-06 DIAGNOSIS — I10 Essential (primary) hypertension: Secondary | ICD-10-CM | POA: Diagnosis not present

## 2020-10-06 DIAGNOSIS — J9690 Respiratory failure, unspecified, unspecified whether with hypoxia or hypercapnia: Secondary | ICD-10-CM | POA: Diagnosis not present

## 2020-10-06 DIAGNOSIS — Z7982 Long term (current) use of aspirin: Secondary | ICD-10-CM | POA: Diagnosis not present

## 2020-10-06 DIAGNOSIS — Z955 Presence of coronary angioplasty implant and graft: Secondary | ICD-10-CM | POA: Diagnosis not present

## 2020-10-06 DIAGNOSIS — N179 Acute kidney failure, unspecified: Secondary | ICD-10-CM | POA: Diagnosis not present

## 2020-10-06 DIAGNOSIS — I5023 Acute on chronic systolic (congestive) heart failure: Secondary | ICD-10-CM | POA: Diagnosis not present

## 2020-10-06 DIAGNOSIS — K810 Acute cholecystitis: Secondary | ICD-10-CM | POA: Diagnosis not present

## 2020-10-06 DIAGNOSIS — I739 Peripheral vascular disease, unspecified: Secondary | ICD-10-CM | POA: Diagnosis not present

## 2020-10-06 DIAGNOSIS — R6889 Other general symptoms and signs: Secondary | ICD-10-CM | POA: Diagnosis not present

## 2020-10-06 DIAGNOSIS — R918 Other nonspecific abnormal finding of lung field: Secondary | ICD-10-CM | POA: Diagnosis not present

## 2020-10-06 DIAGNOSIS — Z8679 Personal history of other diseases of the circulatory system: Secondary | ICD-10-CM | POA: Diagnosis not present

## 2020-10-06 DIAGNOSIS — N183 Chronic kidney disease, stage 3 unspecified: Secondary | ICD-10-CM | POA: Diagnosis not present

## 2020-10-06 DIAGNOSIS — D72829 Elevated white blood cell count, unspecified: Secondary | ICD-10-CM | POA: Diagnosis not present

## 2020-10-06 DIAGNOSIS — E119 Type 2 diabetes mellitus without complications: Secondary | ICD-10-CM | POA: Diagnosis not present

## 2020-10-06 DIAGNOSIS — I214 Non-ST elevation (NSTEMI) myocardial infarction: Secondary | ICD-10-CM | POA: Diagnosis not present

## 2020-10-06 DIAGNOSIS — J440 Chronic obstructive pulmonary disease with acute lower respiratory infection: Secondary | ICD-10-CM | POA: Diagnosis not present

## 2020-10-06 DIAGNOSIS — Z452 Encounter for adjustment and management of vascular access device: Secondary | ICD-10-CM | POA: Diagnosis not present

## 2020-10-06 DIAGNOSIS — J449 Chronic obstructive pulmonary disease, unspecified: Secondary | ICD-10-CM | POA: Diagnosis not present

## 2020-10-06 DIAGNOSIS — J441 Chronic obstructive pulmonary disease with (acute) exacerbation: Secondary | ICD-10-CM | POA: Diagnosis not present

## 2020-10-06 DIAGNOSIS — I429 Cardiomyopathy, unspecified: Secondary | ICD-10-CM | POA: Diagnosis not present

## 2020-10-06 DIAGNOSIS — I4581 Long QT syndrome: Secondary | ICD-10-CM | POA: Diagnosis not present

## 2020-10-06 DIAGNOSIS — E1165 Type 2 diabetes mellitus with hyperglycemia: Secondary | ICD-10-CM | POA: Diagnosis not present

## 2020-10-06 DIAGNOSIS — I5020 Unspecified systolic (congestive) heart failure: Secondary | ICD-10-CM | POA: Diagnosis not present

## 2020-10-06 DIAGNOSIS — R0600 Dyspnea, unspecified: Secondary | ICD-10-CM | POA: Diagnosis not present

## 2020-10-06 DIAGNOSIS — N184 Chronic kidney disease, stage 4 (severe): Secondary | ICD-10-CM | POA: Diagnosis not present

## 2020-10-06 DIAGNOSIS — R9431 Abnormal electrocardiogram [ECG] [EKG]: Secondary | ICD-10-CM | POA: Diagnosis not present

## 2020-10-06 DIAGNOSIS — Z87891 Personal history of nicotine dependence: Secondary | ICD-10-CM | POA: Diagnosis not present

## 2020-10-06 DIAGNOSIS — K8020 Calculus of gallbladder without cholecystitis without obstruction: Secondary | ICD-10-CM | POA: Diagnosis not present

## 2020-10-06 DIAGNOSIS — E1122 Type 2 diabetes mellitus with diabetic chronic kidney disease: Secondary | ICD-10-CM | POA: Diagnosis not present

## 2020-10-06 DIAGNOSIS — R069 Unspecified abnormalities of breathing: Secondary | ICD-10-CM | POA: Diagnosis not present

## 2020-10-06 DIAGNOSIS — J80 Acute respiratory distress syndrome: Secondary | ICD-10-CM | POA: Diagnosis not present

## 2020-10-06 DIAGNOSIS — I509 Heart failure, unspecified: Secondary | ICD-10-CM | POA: Diagnosis not present

## 2020-10-06 DIAGNOSIS — S60219A Contusion of unspecified wrist, initial encounter: Secondary | ICD-10-CM | POA: Diagnosis not present

## 2020-10-06 DIAGNOSIS — I130 Hypertensive heart and chronic kidney disease with heart failure and stage 1 through stage 4 chronic kidney disease, or unspecified chronic kidney disease: Secondary | ICD-10-CM | POA: Diagnosis not present

## 2020-10-06 DIAGNOSIS — R079 Chest pain, unspecified: Secondary | ICD-10-CM | POA: Diagnosis not present

## 2020-10-06 DIAGNOSIS — I340 Nonrheumatic mitral (valve) insufficiency: Secondary | ICD-10-CM | POA: Diagnosis not present

## 2020-10-06 DIAGNOSIS — G9340 Encephalopathy, unspecified: Secondary | ICD-10-CM | POA: Diagnosis not present

## 2020-10-06 DIAGNOSIS — M7989 Other specified soft tissue disorders: Secondary | ICD-10-CM | POA: Diagnosis not present

## 2020-10-07 DIAGNOSIS — I501 Left ventricular failure: Secondary | ICD-10-CM | POA: Diagnosis not present

## 2020-10-07 DIAGNOSIS — I509 Heart failure, unspecified: Secondary | ICD-10-CM | POA: Diagnosis not present

## 2020-10-07 DIAGNOSIS — I361 Nonrheumatic tricuspid (valve) insufficiency: Secondary | ICD-10-CM | POA: Diagnosis not present

## 2020-10-07 DIAGNOSIS — I2510 Atherosclerotic heart disease of native coronary artery without angina pectoris: Secondary | ICD-10-CM | POA: Diagnosis not present

## 2020-10-07 DIAGNOSIS — N184 Chronic kidney disease, stage 4 (severe): Secondary | ICD-10-CM | POA: Diagnosis not present

## 2020-10-07 DIAGNOSIS — I214 Non-ST elevation (NSTEMI) myocardial infarction: Secondary | ICD-10-CM | POA: Diagnosis not present

## 2020-10-07 DIAGNOSIS — I340 Nonrheumatic mitral (valve) insufficiency: Secondary | ICD-10-CM | POA: Diagnosis not present

## 2020-10-08 DIAGNOSIS — I2510 Atherosclerotic heart disease of native coronary artery without angina pectoris: Secondary | ICD-10-CM | POA: Diagnosis not present

## 2020-10-08 DIAGNOSIS — N184 Chronic kidney disease, stage 4 (severe): Secondary | ICD-10-CM | POA: Diagnosis not present

## 2020-10-08 DIAGNOSIS — I509 Heart failure, unspecified: Secondary | ICD-10-CM | POA: Diagnosis not present

## 2020-10-08 DIAGNOSIS — R791 Abnormal coagulation profile: Secondary | ICD-10-CM | POA: Diagnosis not present

## 2020-10-08 DIAGNOSIS — I214 Non-ST elevation (NSTEMI) myocardial infarction: Secondary | ICD-10-CM | POA: Diagnosis not present

## 2020-10-08 DIAGNOSIS — N183 Chronic kidney disease, stage 3 unspecified: Secondary | ICD-10-CM | POA: Diagnosis not present

## 2020-10-08 DIAGNOSIS — R079 Chest pain, unspecified: Secondary | ICD-10-CM | POA: Diagnosis not present

## 2020-10-09 DIAGNOSIS — I255 Ischemic cardiomyopathy: Secondary | ICD-10-CM | POA: Diagnosis not present

## 2020-10-09 DIAGNOSIS — N184 Chronic kidney disease, stage 4 (severe): Secondary | ICD-10-CM | POA: Diagnosis not present

## 2020-10-09 DIAGNOSIS — I509 Heart failure, unspecified: Secondary | ICD-10-CM | POA: Diagnosis not present

## 2020-10-09 DIAGNOSIS — N179 Acute kidney failure, unspecified: Secondary | ICD-10-CM | POA: Diagnosis not present

## 2020-10-09 DIAGNOSIS — I214 Non-ST elevation (NSTEMI) myocardial infarction: Secondary | ICD-10-CM | POA: Diagnosis not present

## 2020-10-10 DIAGNOSIS — I214 Non-ST elevation (NSTEMI) myocardial infarction: Secondary | ICD-10-CM | POA: Diagnosis not present

## 2020-10-10 DIAGNOSIS — I509 Heart failure, unspecified: Secondary | ICD-10-CM | POA: Diagnosis not present

## 2020-10-10 DIAGNOSIS — N179 Acute kidney failure, unspecified: Secondary | ICD-10-CM | POA: Diagnosis not present

## 2020-10-10 DIAGNOSIS — N184 Chronic kidney disease, stage 4 (severe): Secondary | ICD-10-CM | POA: Diagnosis not present

## 2020-10-11 DIAGNOSIS — I509 Heart failure, unspecified: Secondary | ICD-10-CM | POA: Diagnosis not present

## 2020-10-11 DIAGNOSIS — I214 Non-ST elevation (NSTEMI) myocardial infarction: Secondary | ICD-10-CM | POA: Diagnosis not present

## 2020-10-11 DIAGNOSIS — N184 Chronic kidney disease, stage 4 (severe): Secondary | ICD-10-CM | POA: Diagnosis not present

## 2020-10-11 DIAGNOSIS — N179 Acute kidney failure, unspecified: Secondary | ICD-10-CM | POA: Diagnosis not present

## 2020-10-12 DIAGNOSIS — I214 Non-ST elevation (NSTEMI) myocardial infarction: Secondary | ICD-10-CM | POA: Diagnosis not present

## 2020-10-12 DIAGNOSIS — I509 Heart failure, unspecified: Secondary | ICD-10-CM | POA: Diagnosis not present

## 2020-10-12 DIAGNOSIS — N179 Acute kidney failure, unspecified: Secondary | ICD-10-CM | POA: Diagnosis not present

## 2020-10-12 DIAGNOSIS — I255 Ischemic cardiomyopathy: Secondary | ICD-10-CM | POA: Diagnosis not present

## 2020-10-12 DIAGNOSIS — R000 Tachycardia, unspecified: Secondary | ICD-10-CM | POA: Diagnosis not present

## 2020-10-12 DIAGNOSIS — D72829 Elevated white blood cell count, unspecified: Secondary | ICD-10-CM | POA: Diagnosis not present

## 2020-10-12 DIAGNOSIS — J441 Chronic obstructive pulmonary disease with (acute) exacerbation: Secondary | ICD-10-CM | POA: Diagnosis not present

## 2020-10-12 DIAGNOSIS — R918 Other nonspecific abnormal finding of lung field: Secondary | ICD-10-CM | POA: Diagnosis not present

## 2020-10-12 DIAGNOSIS — J9601 Acute respiratory failure with hypoxia: Secondary | ICD-10-CM | POA: Diagnosis not present

## 2020-10-12 DIAGNOSIS — N184 Chronic kidney disease, stage 4 (severe): Secondary | ICD-10-CM | POA: Diagnosis not present

## 2020-10-13 DIAGNOSIS — I255 Ischemic cardiomyopathy: Secondary | ICD-10-CM | POA: Diagnosis not present

## 2020-10-13 DIAGNOSIS — I214 Non-ST elevation (NSTEMI) myocardial infarction: Secondary | ICD-10-CM | POA: Diagnosis not present

## 2020-10-13 DIAGNOSIS — I509 Heart failure, unspecified: Secondary | ICD-10-CM | POA: Diagnosis not present

## 2020-10-13 DIAGNOSIS — N184 Chronic kidney disease, stage 4 (severe): Secondary | ICD-10-CM | POA: Diagnosis not present

## 2020-10-17 DIAGNOSIS — J9601 Acute respiratory failure with hypoxia: Secondary | ICD-10-CM | POA: Diagnosis not present

## 2020-10-17 DIAGNOSIS — I10 Essential (primary) hypertension: Secondary | ICD-10-CM | POA: Diagnosis not present

## 2020-10-18 DIAGNOSIS — J9601 Acute respiratory failure with hypoxia: Secondary | ICD-10-CM | POA: Diagnosis not present

## 2020-10-19 DIAGNOSIS — R9431 Abnormal electrocardiogram [ECG] [EKG]: Secondary | ICD-10-CM | POA: Diagnosis not present

## 2020-10-19 DIAGNOSIS — J9601 Acute respiratory failure with hypoxia: Secondary | ICD-10-CM | POA: Diagnosis not present

## 2020-10-19 DIAGNOSIS — I4581 Long QT syndrome: Secondary | ICD-10-CM | POA: Diagnosis not present

## 2020-10-19 DIAGNOSIS — G9340 Encephalopathy, unspecified: Secondary | ICD-10-CM | POA: Diagnosis not present

## 2020-10-19 DIAGNOSIS — J9690 Respiratory failure, unspecified, unspecified whether with hypoxia or hypercapnia: Secondary | ICD-10-CM | POA: Diagnosis not present

## 2020-10-20 ENCOUNTER — Other Ambulatory Visit: Payer: Self-pay | Admitting: Internal Medicine

## 2020-10-20 DIAGNOSIS — G9340 Encephalopathy, unspecified: Secondary | ICD-10-CM | POA: Diagnosis not present

## 2020-10-20 DIAGNOSIS — J80 Acute respiratory distress syndrome: Secondary | ICD-10-CM | POA: Diagnosis not present

## 2020-10-20 DIAGNOSIS — J9601 Acute respiratory failure with hypoxia: Secondary | ICD-10-CM | POA: Diagnosis not present

## 2020-10-21 DIAGNOSIS — G9340 Encephalopathy, unspecified: Secondary | ICD-10-CM | POA: Diagnosis not present

## 2020-10-21 DIAGNOSIS — J9601 Acute respiratory failure with hypoxia: Secondary | ICD-10-CM | POA: Diagnosis not present

## 2020-10-25 DIAGNOSIS — K8020 Calculus of gallbladder without cholecystitis without obstruction: Secondary | ICD-10-CM | POA: Diagnosis not present

## 2020-10-26 DIAGNOSIS — K810 Acute cholecystitis: Secondary | ICD-10-CM | POA: Diagnosis not present

## 2020-10-29 DIAGNOSIS — D72829 Elevated white blood cell count, unspecified: Secondary | ICD-10-CM | POA: Diagnosis not present

## 2020-10-29 DIAGNOSIS — J9601 Acute respiratory failure with hypoxia: Secondary | ICD-10-CM | POA: Diagnosis not present

## 2020-10-29 DIAGNOSIS — I429 Cardiomyopathy, unspecified: Secondary | ICD-10-CM | POA: Diagnosis not present

## 2020-10-29 DIAGNOSIS — I739 Peripheral vascular disease, unspecified: Secondary | ICD-10-CM | POA: Diagnosis not present

## 2020-10-30 DIAGNOSIS — M7989 Other specified soft tissue disorders: Secondary | ICD-10-CM | POA: Diagnosis not present

## 2020-10-30 DIAGNOSIS — J9601 Acute respiratory failure with hypoxia: Secondary | ICD-10-CM | POA: Diagnosis not present

## 2020-10-30 DIAGNOSIS — I429 Cardiomyopathy, unspecified: Secondary | ICD-10-CM | POA: Diagnosis not present

## 2020-10-30 DIAGNOSIS — I739 Peripheral vascular disease, unspecified: Secondary | ICD-10-CM | POA: Diagnosis not present

## 2020-10-30 DIAGNOSIS — D72829 Elevated white blood cell count, unspecified: Secondary | ICD-10-CM | POA: Diagnosis not present

## 2020-11-01 ENCOUNTER — Other Ambulatory Visit: Payer: Self-pay | Admitting: Internal Medicine

## 2020-11-01 NOTE — Telephone Encounter (Signed)
Please refill as per office routine med refill policy (all routine meds refilled for 3 mo or monthly per pt preference up to one year from last visit, then month to month grace period for 3 mo, then further med refills will have to be denied)  Except - no need refill allegra

## 2020-11-01 NOTE — Telephone Encounter (Signed)
Sorry correction   Ok to call pt - no need to take the alendronate further if she has taken total of 5 yrs, ok to stop if take total 5 yrs or more

## 2020-11-02 DIAGNOSIS — I739 Peripheral vascular disease, unspecified: Secondary | ICD-10-CM | POA: Diagnosis not present

## 2020-11-02 DIAGNOSIS — I429 Cardiomyopathy, unspecified: Secondary | ICD-10-CM | POA: Diagnosis not present

## 2020-11-02 DIAGNOSIS — J9601 Acute respiratory failure with hypoxia: Secondary | ICD-10-CM | POA: Diagnosis not present

## 2020-11-02 DIAGNOSIS — D72829 Elevated white blood cell count, unspecified: Secondary | ICD-10-CM | POA: Diagnosis not present

## 2020-11-03 DIAGNOSIS — I739 Peripheral vascular disease, unspecified: Secondary | ICD-10-CM | POA: Diagnosis not present

## 2020-11-03 DIAGNOSIS — J9601 Acute respiratory failure with hypoxia: Secondary | ICD-10-CM | POA: Diagnosis not present

## 2020-11-03 DIAGNOSIS — Z452 Encounter for adjustment and management of vascular access device: Secondary | ICD-10-CM | POA: Diagnosis not present

## 2020-11-03 DIAGNOSIS — D72829 Elevated white blood cell count, unspecified: Secondary | ICD-10-CM | POA: Diagnosis not present

## 2020-11-03 DIAGNOSIS — I429 Cardiomyopathy, unspecified: Secondary | ICD-10-CM | POA: Diagnosis not present

## 2020-11-04 DIAGNOSIS — J9601 Acute respiratory failure with hypoxia: Secondary | ICD-10-CM | POA: Diagnosis not present

## 2020-11-04 DIAGNOSIS — I429 Cardiomyopathy, unspecified: Secondary | ICD-10-CM | POA: Diagnosis not present

## 2020-11-04 DIAGNOSIS — I739 Peripheral vascular disease, unspecified: Secondary | ICD-10-CM | POA: Diagnosis not present

## 2020-11-04 DIAGNOSIS — D72829 Elevated white blood cell count, unspecified: Secondary | ICD-10-CM | POA: Diagnosis not present

## 2020-11-05 DIAGNOSIS — I429 Cardiomyopathy, unspecified: Secondary | ICD-10-CM | POA: Diagnosis not present

## 2020-11-05 DIAGNOSIS — J449 Chronic obstructive pulmonary disease, unspecified: Secondary | ICD-10-CM | POA: Diagnosis not present

## 2020-11-05 DIAGNOSIS — D72829 Elevated white blood cell count, unspecified: Secondary | ICD-10-CM | POA: Diagnosis not present

## 2020-11-05 DIAGNOSIS — J9601 Acute respiratory failure with hypoxia: Secondary | ICD-10-CM | POA: Diagnosis not present

## 2020-11-05 DIAGNOSIS — I110 Hypertensive heart disease with heart failure: Secondary | ICD-10-CM | POA: Diagnosis not present

## 2020-11-05 DIAGNOSIS — I739 Peripheral vascular disease, unspecified: Secondary | ICD-10-CM | POA: Diagnosis not present

## 2020-11-06 DIAGNOSIS — U071 COVID-19: Secondary | ICD-10-CM | POA: Diagnosis not present

## 2020-11-06 DIAGNOSIS — R0600 Dyspnea, unspecified: Secondary | ICD-10-CM | POA: Diagnosis not present

## 2020-11-06 DIAGNOSIS — E872 Acidosis: Secondary | ICD-10-CM | POA: Diagnosis not present

## 2020-11-06 DIAGNOSIS — I214 Non-ST elevation (NSTEMI) myocardial infarction: Secondary | ICD-10-CM | POA: Diagnosis not present

## 2020-11-06 DIAGNOSIS — J9601 Acute respiratory failure with hypoxia: Secondary | ICD-10-CM | POA: Diagnosis not present

## 2020-11-06 DIAGNOSIS — J1282 Pneumonia due to coronavirus disease 2019: Secondary | ICD-10-CM | POA: Diagnosis not present

## 2020-11-06 DIAGNOSIS — I739 Peripheral vascular disease, unspecified: Secondary | ICD-10-CM | POA: Diagnosis not present

## 2020-11-06 DIAGNOSIS — S60219A Contusion of unspecified wrist, initial encounter: Secondary | ICD-10-CM | POA: Diagnosis not present

## 2020-11-06 DIAGNOSIS — J449 Chronic obstructive pulmonary disease, unspecified: Secondary | ICD-10-CM | POA: Diagnosis not present

## 2020-11-06 DIAGNOSIS — I255 Ischemic cardiomyopathy: Secondary | ICD-10-CM | POA: Diagnosis not present

## 2020-11-06 DIAGNOSIS — Z79899 Other long term (current) drug therapy: Secondary | ICD-10-CM | POA: Diagnosis not present

## 2020-11-06 DIAGNOSIS — R5383 Other fatigue: Secondary | ICD-10-CM | POA: Diagnosis not present

## 2020-11-06 DIAGNOSIS — Z452 Encounter for adjustment and management of vascular access device: Secondary | ICD-10-CM | POA: Diagnosis not present

## 2020-11-06 DIAGNOSIS — I110 Hypertensive heart disease with heart failure: Secondary | ICD-10-CM | POA: Diagnosis not present

## 2020-11-06 DIAGNOSIS — E119 Type 2 diabetes mellitus without complications: Secondary | ICD-10-CM | POA: Diagnosis not present

## 2020-11-06 DIAGNOSIS — I429 Cardiomyopathy, unspecified: Secondary | ICD-10-CM | POA: Diagnosis not present

## 2020-11-06 DIAGNOSIS — I509 Heart failure, unspecified: Secondary | ICD-10-CM | POA: Diagnosis not present

## 2020-11-06 DIAGNOSIS — R627 Adult failure to thrive: Secondary | ICD-10-CM | POA: Diagnosis not present

## 2020-11-06 DIAGNOSIS — N184 Chronic kidney disease, stage 4 (severe): Secondary | ICD-10-CM | POA: Diagnosis not present

## 2020-11-06 DIAGNOSIS — D72829 Elevated white blood cell count, unspecified: Secondary | ICD-10-CM | POA: Diagnosis not present

## 2020-11-06 DIAGNOSIS — I10 Essential (primary) hypertension: Secondary | ICD-10-CM | POA: Diagnosis not present

## 2020-11-09 DIAGNOSIS — R5383 Other fatigue: Secondary | ICD-10-CM | POA: Diagnosis not present

## 2020-11-11 DIAGNOSIS — I214 Non-ST elevation (NSTEMI) myocardial infarction: Secondary | ICD-10-CM | POA: Diagnosis not present

## 2020-11-11 DIAGNOSIS — Z79899 Other long term (current) drug therapy: Secondary | ICD-10-CM | POA: Diagnosis not present

## 2020-11-11 DIAGNOSIS — U071 COVID-19: Secondary | ICD-10-CM | POA: Diagnosis not present

## 2020-11-11 DIAGNOSIS — I509 Heart failure, unspecified: Secondary | ICD-10-CM | POA: Diagnosis not present

## 2020-11-11 DIAGNOSIS — J1282 Pneumonia due to coronavirus disease 2019: Secondary | ICD-10-CM | POA: Diagnosis not present

## 2020-11-12 DIAGNOSIS — I509 Heart failure, unspecified: Secondary | ICD-10-CM | POA: Diagnosis not present

## 2020-11-12 DIAGNOSIS — E872 Acidosis: Secondary | ICD-10-CM | POA: Diagnosis not present

## 2020-11-12 DIAGNOSIS — N184 Chronic kidney disease, stage 4 (severe): Secondary | ICD-10-CM | POA: Diagnosis not present

## 2020-11-13 DIAGNOSIS — U071 COVID-19: Secondary | ICD-10-CM | POA: Diagnosis not present

## 2020-11-13 DIAGNOSIS — I509 Heart failure, unspecified: Secondary | ICD-10-CM | POA: Diagnosis not present

## 2020-11-13 DIAGNOSIS — J449 Chronic obstructive pulmonary disease, unspecified: Secondary | ICD-10-CM | POA: Diagnosis not present

## 2020-11-13 DIAGNOSIS — R627 Adult failure to thrive: Secondary | ICD-10-CM | POA: Diagnosis not present

## 2020-11-13 DIAGNOSIS — J1282 Pneumonia due to coronavirus disease 2019: Secondary | ICD-10-CM | POA: Diagnosis not present

## 2020-11-13 DIAGNOSIS — E872 Acidosis: Secondary | ICD-10-CM | POA: Diagnosis not present

## 2020-11-17 DIAGNOSIS — J449 Chronic obstructive pulmonary disease, unspecified: Secondary | ICD-10-CM | POA: Diagnosis not present

## 2020-11-17 DIAGNOSIS — I509 Heart failure, unspecified: Secondary | ICD-10-CM | POA: Diagnosis not present

## 2020-11-19 DIAGNOSIS — I509 Heart failure, unspecified: Secondary | ICD-10-CM | POA: Diagnosis not present

## 2020-11-19 DIAGNOSIS — N184 Chronic kidney disease, stage 4 (severe): Secondary | ICD-10-CM | POA: Diagnosis not present

## 2020-11-24 DIAGNOSIS — I509 Heart failure, unspecified: Secondary | ICD-10-CM | POA: Diagnosis not present

## 2020-11-24 DIAGNOSIS — N184 Chronic kidney disease, stage 4 (severe): Secondary | ICD-10-CM | POA: Diagnosis not present

## 2021-02-21 DEATH — deceased

## 2021-07-01 ENCOUNTER — Telehealth: Payer: Self-pay | Admitting: *Deleted

## 2021-07-01 NOTE — Chronic Care Management (AMB) (Signed)
  Chronic Care Management   Outreach Note  07/01/2021 Name: [name] MRN: [id number] DOB: November 07, 1945  [name] is a 75 y.o. year old female who is a primary care patient of [name], MD. I reached out to [name] by phone today in response to a referral sent by Ms. [place] PCP, Dr. [name].      An unsuccessful telephone outreach was attempted today. The patient was referred to the case management team for assistance with care management and care coordination.   Follow Up Plan: A HIPAA compliant phone message was left for the patient providing contact information and requesting a return call. The care management team will reach out to the patient again over the next 7 days.  If patient returns call to provider office, please advise to call [place] at [phone].  [facility] [place] [facility]  Direct Dial: [phone]

## 2021-07-08 NOTE — Chronic Care Management (AMB) (Signed)
  Chronic Care Management   Outreach Note  07/08/2021 Name: [name] MRN: [id number] DOB: 12/28/1945  [name] is a 75 y.o. year old female who is a primary care patient of [name], MD. I reached out to [name] by phone today in response to a referral sent by Ms. [place] PCP, Dr. [name].       A second unsuccessful telephone outreach was attempted today. The patient was referred to the case management team for assistance with care management and care coordination.   Follow Up Plan: The care management team will reach out to the patient again over the next 7 days. If patient returns call to provider office, please advise to call [place] at [phone].  [facility]  Direct Dial: [phone]

## 2021-07-16 NOTE — Chronic Care Management (AMB) (Signed)
  Chronic Care Management   Outreach Note  07/16/2021 Name: [name] MRN: [id number] DOB: January 07, 1946  [name] is a 75 y.o. year old female who is a primary care patient of [name], MD. I reached out to [name] by phone today in response to a referral sent by Ms. [place] PCP. Third unsuccessful telephone outreach was attempted today. The patient was referred to the case management team for assistance with care management and care coordination. The patient's primary care provider has been notified of our unsuccessful attempts to make or maintain contact with the patient. The care management team is pleased to engage with this patient at any time in the future should he/she be interested in assistance from the care management team.   Follow Up Plan: We have been unable to make contact with the patient. The care management team is available to follow up with the patient after provider conversation with the patient regarding recommendation for care management engagement and subsequent re-referral to the care management team.   [facility]  Direct Dial: [phone]
